# Patient Record
Sex: Male | Born: 1935 | Race: White | Hispanic: No | Marital: Married | Smoking: Former smoker
Health system: Southern US, Community
[De-identification: ages and names within clinical notes are randomized; demographics above are authoritative.]

## PROBLEM LIST (undated history)

## (undated) DIAGNOSIS — I639 Cerebral infarction, unspecified: Secondary | ICD-10-CM

## (undated) DIAGNOSIS — J811 Chronic pulmonary edema: Secondary | ICD-10-CM

## (undated) DIAGNOSIS — K625 Hemorrhage of anus and rectum: Secondary | ICD-10-CM

## (undated) DIAGNOSIS — I10 Essential (primary) hypertension: Secondary | ICD-10-CM

## (undated) DIAGNOSIS — K922 Gastrointestinal hemorrhage, unspecified: Secondary | ICD-10-CM

## (undated) DIAGNOSIS — C61 Malignant neoplasm of prostate: Secondary | ICD-10-CM

## (undated) DIAGNOSIS — K5792 Diverticulitis of intestine, part unspecified, without perforation or abscess without bleeding: Secondary | ICD-10-CM

## (undated) DIAGNOSIS — J449 Chronic obstructive pulmonary disease, unspecified: Secondary | ICD-10-CM

## (undated) DIAGNOSIS — J189 Pneumonia, unspecified organism: Secondary | ICD-10-CM

## (undated) DIAGNOSIS — G4730 Sleep apnea, unspecified: Secondary | ICD-10-CM

## (undated) HISTORY — PX: APPENDECTOMY: SHX54

## (undated) HISTORY — PX: TONSILLECTOMY: SUR1361

## (undated) HISTORY — DX: Sleep apnea, unspecified: G47.30

## (undated) HISTORY — DX: Hemorrhage of anus and rectum: K62.5

## (undated) HISTORY — DX: Diverticulitis of intestine, part unspecified, without perforation or abscess without bleeding: K57.92

## (undated) HISTORY — DX: Chronic pulmonary edema: J81.1

## (undated) HISTORY — DX: Essential (primary) hypertension: I10

## (undated) HISTORY — PX: HEMORRHOID SURGERY: SHX153

## (undated) HISTORY — DX: Pneumonia, unspecified organism: J18.9

## (undated) HISTORY — DX: Gastrointestinal hemorrhage, unspecified: K92.2

## (undated) HISTORY — DX: Malignant neoplasm of prostate: C61

## (undated) HISTORY — DX: Chronic obstructive pulmonary disease, unspecified: J44.9

## (undated) HISTORY — DX: Cerebral infarction, unspecified: I63.9

## (undated) HISTORY — PX: OTHER SURGICAL HISTORY: SHX169

---

## 2005-04-01 ENCOUNTER — Ambulatory Visit: Payer: Self-pay | Admitting: Specialist

## 2005-07-18 ENCOUNTER — Ambulatory Visit: Payer: Self-pay | Admitting: Gastroenterology

## 2007-09-20 ENCOUNTER — Ambulatory Visit: Payer: Self-pay | Admitting: Oncology

## 2007-10-08 ENCOUNTER — Ambulatory Visit: Payer: Self-pay | Admitting: Family Medicine

## 2007-10-13 ENCOUNTER — Ambulatory Visit: Payer: Self-pay | Admitting: Oncology

## 2007-10-18 ENCOUNTER — Ambulatory Visit: Payer: Self-pay | Admitting: Oncology

## 2007-12-09 ENCOUNTER — Ambulatory Visit: Payer: Self-pay | Admitting: Ophthalmology

## 2007-12-09 ENCOUNTER — Other Ambulatory Visit: Payer: Self-pay

## 2007-12-22 ENCOUNTER — Ambulatory Visit: Payer: Self-pay | Admitting: Ophthalmology

## 2008-01-12 ENCOUNTER — Ambulatory Visit: Payer: Self-pay | Admitting: Oncology

## 2008-01-18 ENCOUNTER — Ambulatory Visit: Payer: Self-pay | Admitting: Oncology

## 2009-02-14 ENCOUNTER — Ambulatory Visit: Payer: Self-pay | Admitting: Unknown Physician Specialty

## 2010-03-06 ENCOUNTER — Ambulatory Visit: Payer: Self-pay | Admitting: Ophthalmology

## 2010-03-20 ENCOUNTER — Ambulatory Visit: Payer: Self-pay | Admitting: Ophthalmology

## 2010-08-19 DIAGNOSIS — I639 Cerebral infarction, unspecified: Secondary | ICD-10-CM

## 2010-08-19 HISTORY — DX: Cerebral infarction, unspecified: I63.9

## 2011-02-19 DIAGNOSIS — D696 Thrombocytopenia, unspecified: Secondary | ICD-10-CM

## 2011-02-19 DIAGNOSIS — J189 Pneumonia, unspecified organism: Secondary | ICD-10-CM

## 2011-02-19 DIAGNOSIS — I10 Essential (primary) hypertension: Secondary | ICD-10-CM

## 2011-02-19 DIAGNOSIS — I270 Primary pulmonary hypertension: Secondary | ICD-10-CM

## 2011-02-26 ENCOUNTER — Telehealth: Payer: Self-pay | Admitting: *Deleted

## 2011-02-26 ENCOUNTER — Inpatient Hospital Stay: Payer: Self-pay | Admitting: Internal Medicine

## 2011-02-26 NOTE — Telephone Encounter (Signed)
[facility] Triage Call Report Triage Record Num: [id number] Operator: Abe People Patient Name: Trevor Ferguson Call Date & Time: 02/25/2011 6:45:53PM Patient Phone: [phone] PCP: Patient Gender: Male PCP Fax : Patient DOB: [birth year] Practice Name: [facility] Reason for Call: [facility] LPN. from [facility] calling that pt had pneumonia and is in rehab to gain strength. He is weaning from O/2. He states he felt nervous inside and vision was blurred and he felt unsteady on his feet and tingling in R hand that passed. His eyes are sill not focusing well. This happened at [place]. B/P = 158/78 HR 71, RR 20 O2 on 2 liters is 96%. Triaged Dizziness ot Vertigo. Last voided at 1745. All emergent SX R/O and home care and call back inst given. Notify ofc or provider that rounds 02/26/11. Protocol(s) Used: Dizziness or Vertigo Recommended Outcome per Protocol: See Provider within 24 hours Reason for Outcome: Associated with fatigue or decreased exercise tolerance Care Advice: ~ DO NOT drive until condition evaluated. ~ Call provider if symptoms worsen or new symptoms develop. Avoid caffeine (coffee, tea, cola drinks, or chocolate), alcohol, and nicotine (use of tobacco), as use of these substances may worsen symptoms. ~ Change position slowly. Sit for a couple of minutes before standing to walk. Quick position changes may cause or worsen symptoms. ~ ~ Call provider if dizziness worsens, have weakness, or ringing in ears. ~ Lie still in a dimly lit room and avoid any sudden change in position. 02/25/2011 6:59:59PM Page 1 of 1 [email or web address]

## 2011-02-26 NOTE — Telephone Encounter (Signed)
Will evaluate status when there this afternoon

## 2011-03-01 DIAGNOSIS — C717 Malignant neoplasm of brain stem: Secondary | ICD-10-CM

## 2011-03-05 DIAGNOSIS — J159 Unspecified bacterial pneumonia: Secondary | ICD-10-CM

## 2011-04-01 ENCOUNTER — Telehealth: Payer: Self-pay | Admitting: *Deleted

## 2011-04-01 NOTE — Telephone Encounter (Signed)
error 

## 2013-06-11 ENCOUNTER — Ambulatory Visit: Payer: Self-pay | Admitting: Specialist

## 2014-03-19 ENCOUNTER — Emergency Department: Payer: Self-pay | Admitting: Emergency Medicine

## 2014-03-19 LAB — BASIC METABOLIC PANEL
Anion Gap: 7 (ref 7–16)
BUN: 26 mg/dL — ABNORMAL HIGH (ref 7–18)
CALCIUM: 8.6 mg/dL (ref 8.5–10.1)
Chloride: 105 mmol/L (ref 98–107)
Co2: 27 mmol/L (ref 21–32)
Creatinine: 1.37 mg/dL — ABNORMAL HIGH (ref 0.60–1.30)
EGFR (African American): 54 — ABNORMAL LOW
EGFR (Non-African Amer.): 46 — ABNORMAL LOW
GLUCOSE: 103 mg/dL — AB (ref 65–99)
Osmolality: 283 (ref 275–301)
POTASSIUM: 4 mmol/L (ref 3.5–5.1)
Sodium: 139 mmol/L (ref 136–145)

## 2014-03-19 LAB — URINALYSIS, COMPLETE
BILIRUBIN, UR: NEGATIVE
BLOOD: NEGATIVE
Bacteria: NONE SEEN
GLUCOSE, UR: NEGATIVE mg/dL (ref 0–75)
KETONE: NEGATIVE
Leukocyte Esterase: NEGATIVE
Nitrite: NEGATIVE
PROTEIN: NEGATIVE
Ph: 6 (ref 4.5–8.0)
SPECIFIC GRAVITY: 1.016 (ref 1.003–1.030)
Squamous Epithelial: NONE SEEN
WBC UR: <1 /HPF (ref 0–5)

## 2014-03-19 LAB — CBC
HCT: 37.7 % — AB (ref 40.0–52.0)
HGB: 12.8 g/dL — ABNORMAL LOW (ref 13.0–18.0)
MCH: 33 pg (ref 26.0–34.0)
MCHC: 34 g/dL (ref 32.0–36.0)
MCV: 97 fL (ref 80–100)
Platelet: 137 10*3/uL — ABNORMAL LOW (ref 150–440)
RBC: 3.88 10*6/uL — ABNORMAL LOW (ref 4.40–5.90)
RDW: 13.2 % (ref 11.5–14.5)
WBC: 8 10*3/uL (ref 3.8–10.6)

## 2014-03-19 LAB — TROPONIN I: Troponin-I: <0.02 ng/mL

## 2014-09-21 DIAGNOSIS — Z8673 Personal history of transient ischemic attack (TIA), and cerebral infarction without residual deficits: Secondary | ICD-10-CM | POA: Insufficient documentation

## 2014-09-21 DIAGNOSIS — I10 Essential (primary) hypertension: Secondary | ICD-10-CM | POA: Insufficient documentation

## 2014-09-21 DIAGNOSIS — J449 Chronic obstructive pulmonary disease, unspecified: Secondary | ICD-10-CM | POA: Insufficient documentation

## 2015-03-28 ENCOUNTER — Encounter: Payer: Self-pay | Admitting: Internal Medicine

## 2015-03-28 ENCOUNTER — Ambulatory Visit
Admission: RE | Admit: 2015-03-28 | Discharge: 2015-03-28 | Disposition: A | Discharge status: Home / Self Care | Payer: Medicare Other | Source: Ambulatory Visit | Attending: Internal Medicine | Admitting: Internal Medicine

## 2015-03-28 ENCOUNTER — Encounter (INDEPENDENT_AMBULATORY_CARE_PROVIDER_SITE_OTHER): Payer: Self-pay

## 2015-03-28 ENCOUNTER — Ambulatory Visit (INDEPENDENT_AMBULATORY_CARE_PROVIDER_SITE_OTHER): Payer: Medicare Other | Admitting: Internal Medicine

## 2015-03-28 VITALS — BP 138/62 | HR 74 | Ht 69.0 in | Wt 207.0 lb

## 2015-03-28 DIAGNOSIS — J441 Chronic obstructive pulmonary disease with (acute) exacerbation: Secondary | ICD-10-CM | POA: Insufficient documentation

## 2015-03-28 DIAGNOSIS — J449 Chronic obstructive pulmonary disease, unspecified: Secondary | ICD-10-CM | POA: Diagnosis not present

## 2015-03-28 DIAGNOSIS — R0602 Shortness of breath: Secondary | ICD-10-CM | POA: Insufficient documentation

## 2015-03-28 DIAGNOSIS — G4730 Sleep apnea, unspecified: Secondary | ICD-10-CM | POA: Diagnosis not present

## 2015-03-28 MED ORDER — MOMETASONE FURO-FORMOTEROL FUM 200-5 MCG/ACT IN AERO: 2.0000 | INHALATION_SPRAY | Freq: Two times a day (BID) | RESPIRATORY_TRACT | Status: DC

## 2015-03-28 MED ORDER — ALBUTEROL SULFATE HFA 108 (90 BASE) MCG/ACT IN AERS: 2.0000 | INHALATION_SPRAY | Freq: Four times a day (QID) | RESPIRATORY_TRACT | Status: DC | PRN

## 2015-03-28 NOTE — Progress Notes (Signed)
Date: 03/28/2015,   MRN# [id number] Trevor Ferguson 1926/07/28 Code Status:  Hosp day:@LENGTHOFSTAYDAYS @ Referring MD: @ATDPROV @     PCP:      AdmissionWeight: 207 lb (93.895 kg)                 CurrentWeight: 207 lb (93.895 kg) Trevor Ferguson is a 81 y.o. old male seen in consultation for SOB with exertion.    CHIEF COMPLAINT:   SOb with exertion   HISTORY OF PRESENT ILLNESS   81 yo white male seen today for chronic SOB and DOe Patient states that his symptoms have been going on for several months, especially going up the stairs, alo woth simple gestures like tying shoes Patient has to sit for approx 10-15 minutes to recover Patient has had mulitpel pneumpnias in the past but none in the last 4 years and he does nOT have any signs of infection at this time  Patient has chronic cough for 25 years with intermittent sputum production Patient also had lower extremity leg swelling intermittent for several months  Patient has been diagnosed with sleep apnea many years ago and has been non-compliant with biPAP Patient states that he is very tired in AM, wife has to sleep in other room because of excessive snoring   PAST MEDICAL HISTORY   Past Medical History  Diagnosis Date  . COPD (chronic obstructive pulmonary disease)   . Hypertension   . Prostate cancer     with seed placement  . Stroke 2012    no residuals  . Pneumonia     [id number] hospitalized  . Sleep apnea     noncompliant, dx 1996     SURGICAL HISTORY   Past Surgical History  Procedure Laterality Date  . Tonsillectomy    . Appendectomy    . Adenoid    . Spine cyst    . Hemorrhoid surgery       FAMILY HISTORY   Family History  Problem Relation Age of Onset  . Congestive Heart Failure Mother   . COPD Sister   . Asthma Sister   . Tuberculosis Maternal Grandmother   . Stroke Maternal Grandfather      SOCIAL HISTORY   History  Substance Use Topics  . Smoking status: Former Smoker    Types:  Cigarettes    Quit date: 08/19/1985  . Smokeless tobacco: Never Used  . Alcohol Use: Yes     MEDICATIONS    Home Medication:  Current Outpatient Rx  Name  Route  Sig  Dispense  Refill  . calcium carbonate (OS-CAL) 600 MG TABS tablet   Oral   Take 600 mg by mouth daily with breakfast.         . Cholecalciferol (VITAMIN D-3 PO)   Oral   Take 1,000 Int'l Units by mouth daily.         Marland Kitchen dipyridamole-aspirin (AGGRENOX) 200-25 MG per 12 hr capsule   Oral   Take 1 capsule by mouth 2 (two) times daily.         . Loratadine (CLARITIN) 10 MG CAPS   Oral   Take 1 capsule by mouth daily.         Marland Kitchen losartan-hydrochlorothiazide (HYZAAR) 100-25 MG per tablet   Oral   Take 1 tablet by mouth daily.         . Multiple Vitamins-Minerals (CENTRAL-VITE PO)   Oral   Take 1 tablet by mouth daily.         Marland Kitchen  Omega-3 Fatty Acids (FISH OIL) 1000 MG CAPS   Oral   Take 1 capsule by mouth daily.         . Probiotic Product (PROBIOTIC ADVANCED PO)   Oral   Take 1 tablet by mouth daily.         Marland Kitchen albuterol (PROVENTIL HFA;VENTOLIN HFA) 108 (90 BASE) MCG/ACT inhaler   Inhalation   Inhale 2 puffs into the lungs every 6 (six) hours as needed for wheezing or shortness of breath.   1 Inhaler   2   . mometasone-formoterol (DULERA) 200-5 MCG/ACT AERO   Inhalation   Inhale 2 puffs into the lungs 2 (two) times daily.   1 Inhaler   12     Current Medication:  Current outpatient prescriptions:  .  calcium carbonate (OS-CAL) 600 MG TABS tablet, Take 600 mg by mouth daily with breakfast., Disp: , Rfl:  .  Cholecalciferol (VITAMIN D-3 PO), Take 1,000 Int'l Units by mouth daily., Disp: , Rfl:  .  dipyridamole-aspirin (AGGRENOX) 200-25 MG per 12 hr capsule, Take 1 capsule by mouth 2 (two) times daily., Disp: , Rfl:  .  Loratadine (CLARITIN) 10 MG CAPS, Take 1 capsule by mouth daily., Disp: , Rfl:  .  losartan-hydrochlorothiazide (HYZAAR) 100-25 MG per tablet, Take 1 tablet by mouth  daily., Disp: , Rfl:  .  Multiple Vitamins-Minerals (CENTRAL-VITE PO), Take 1 tablet by mouth daily., Disp: , Rfl:  .  Omega-3 Fatty Acids (FISH OIL) 1000 MG CAPS, Take 1 capsule by mouth daily., Disp: , Rfl:  .  Probiotic Product (PROBIOTIC ADVANCED PO), Take 1 tablet by mouth daily., Disp: , Rfl:  .  albuterol (PROVENTIL HFA;VENTOLIN HFA) 108 (90 BASE) MCG/ACT inhaler, Inhale 2 puffs into the lungs every 6 (six) hours as needed for wheezing or shortness of breath., Disp: 1 Inhaler, Rfl: 2 .  mometasone-formoterol (DULERA) 200-5 MCG/ACT AERO, Inhale 2 puffs into the lungs 2 (two) times daily., Disp: 1 Inhaler, Rfl: 12    ALLERGIES   Penicillins and Darvon     REVIEW OF SYSTEMS   Review of Systems  Constitutional: Positive for malaise/fatigue. Negative for fever, chills and weight loss.  HENT: Negative for congestion, hearing loss, sore throat and tinnitus.   Eyes: Negative for blurred vision and double vision.  Respiratory: Positive for cough, sputum production and shortness of breath. Negative for wheezing and stridor.   Cardiovascular: Positive for orthopnea and leg swelling. Negative for chest pain and palpitations.  Gastrointestinal: Negative for heartburn, nausea, vomiting and abdominal pain.  Genitourinary: Negative for dysuria.  Musculoskeletal: Negative for myalgias and neck pain.  Skin: Negative for itching and rash.  Neurological: Negative for dizziness, tingling, tremors, speech change and headaches.  Endo/Heme/Allergies: Does not bruise/bleed easily.  Psychiatric/Behavioral: Negative for depression. The patient is not nervous/anxious.      VS: BP 138/62 mmHg  Pulse 74  Ht 5\' 9"  (1.753 m)  Wt 207 lb (93.895 kg)  BMI 30.55 kg/m2  SpO2 93%     PHYSICAL EXAM  Physical Exam  Constitutional: He is oriented to person, place, and time. He appears well-developed and well-nourished. No distress.  HENT:  Head: Normocephalic and atraumatic.  Mouth/Throat: No  oropharyngeal exudate.  Eyes: EOM are normal. Pupils are equal, round, and reactive to light. No scleral icterus.  Neck: Normal range of motion. Neck supple.  Cardiovascular: Normal rate, regular rhythm and normal heart sounds.   No murmur heard. Pulmonary/Chest: No stridor. No respiratory distress. He has no wheezes.  Abdominal: Soft. Bowel sounds are normal. He exhibits no distension. There is no tenderness. There is no rebound.  Musculoskeletal: Normal range of motion. He exhibits no edema.  Neurological: He is alert and oriented to person, place, and time. He displays normal reflexes. Coordination normal.  Skin: Skin is warm. He is not diaphoretic.  Psychiatric: He has a normal mood and affect.        LABS    No results for input(s): HGB, HCT, MCV, WBC, POTASSIUM, CHLORIDE, BUN, CREATININE, GLUCOSE, CALCIUM, INR, PTT in the last 72 hours.  Invalid input(s): PLATELET, BANDS, NEUTROPHIL, LYMPHOCYTE, MONOCYTE, EOSINOPHILS, BASOPHIL, SODIUM, BICARBONATE, MAGNESIUM, PHOSPHORUS, PT, SGPT, SGOT,    No results for input(s): PH in the last 72 hours.  Invalid input(s): PCO2, PO2, BASEEXCESS, BASEDEFICITE, TFT    CULTURE RESULTS   No results found for this or any previous visit (from the past 240 hour(s)).        IMAGING    No results found.       ASSESSMENT/PLAN    81 yo pleasant white male with chronic SOB with chronic cough findings suggest COPD with chronic Bronchitis with DOE associated with LE edema with signs and symptoms of sleep apnea which could be related to Cor pumonale/RT heart strain and possible CHF     SOB (shortness of breath) -patient with chronic SOB and DOE with LE edema-will need to assess cardiac function and assess for PUlm HTN -will need further work up at this point -will obtain CXR -will obtain ECHO -will obtain PFT's  Sleep apnea -patient has been diagnosed with sleep apnea over 15 years ago -will need to assess for OSA with sleep  study-set up for split night study  COPD (chronic obstructive pulmonary disease) -will repeat PFT's -patient quit tobacco use several years ago -previous PFT-shows moderate obstruction of small airways -will start Dulera -advised to use albuterol as needed        I have personally obtained a history, examined the patient, evaluated laboratory and independently reviewed imaging results, formulated the assessment and plan and placed orders.  The Patient requires high complexity decision making for assessment and support, frequent evaluation and titration of therapies, application of advanced monitoring technologies and extensive interpretation of multiple databases. Time spent with patient 45 minutes.  Patient is satisfied with Plan of action and management.    [name], M.D.  [name]  Medical Director [place] Director [facility] Cardio-Pulmonary Department

## 2015-03-28 NOTE — Assessment & Plan Note (Signed)
-  patient has been diagnosed with sleep apnea over 15 years ago -will need to assess for OSA with sleep study-set up for split night study

## 2015-03-28 NOTE — Assessment & Plan Note (Signed)
-  will repeat PFT's -patient quit tobacco use several years ago -previous PFT-shows moderate obstruction of small airways -will start Dulera -advised to use albuterol as needed

## 2015-03-28 NOTE — Patient Instructions (Signed)

## 2015-03-28 NOTE — Assessment & Plan Note (Signed)
-  patient with chronic SOB and DOE with LE edema-will need to assess cardiac function and assess for PUlm HTN -will need further work up at this point -will obtain CXR -will obtain ECHO -will obtain PFT's

## 2015-04-04 ENCOUNTER — Ambulatory Visit (INDEPENDENT_AMBULATORY_CARE_PROVIDER_SITE_OTHER): Payer: Medicare Other

## 2015-04-04 ENCOUNTER — Other Ambulatory Visit: Payer: Self-pay

## 2015-04-04 DIAGNOSIS — R0602 Shortness of breath: Secondary | ICD-10-CM | POA: Diagnosis not present

## 2015-05-03 ENCOUNTER — Ambulatory Visit: Payer: Medicare Other | Attending: Otolaryngology

## 2015-05-03 DIAGNOSIS — G4733 Obstructive sleep apnea (adult) (pediatric): Secondary | ICD-10-CM | POA: Insufficient documentation

## 2015-05-23 ENCOUNTER — Telehealth: Payer: Self-pay | Admitting: *Deleted

## 2015-05-23 NOTE — Telephone Encounter (Signed)
Patient is in cardiology clinic with his wife today. He stopped by my desk to let us know that he has not heard any results from recent test. Please call patient.

## 2015-05-23 NOTE — Telephone Encounter (Signed)
Pt informed and requested earlier appt than Nov. Pt scheduled. Nothing further needed.

## 2015-05-23 NOTE — Telephone Encounter (Signed)
Pt is asking for results of CXR and Echo. Pt is in cardiology with wife. Thanks.

## 2015-05-23 NOTE — Telephone Encounter (Signed)
cxr c/w COPD

## 2015-05-30 ENCOUNTER — Encounter: Payer: Self-pay | Admitting: Internal Medicine

## 2015-05-30 ENCOUNTER — Ambulatory Visit (INDEPENDENT_AMBULATORY_CARE_PROVIDER_SITE_OTHER): Payer: Medicare Other | Admitting: Internal Medicine

## 2015-05-30 VITALS — BP 122/76 | HR 74 | Wt 211.0 lb

## 2015-05-30 DIAGNOSIS — I519 Heart disease, unspecified: Secondary | ICD-10-CM | POA: Diagnosis not present

## 2015-05-30 DIAGNOSIS — J449 Chronic obstructive pulmonary disease, unspecified: Secondary | ICD-10-CM

## 2015-05-30 DIAGNOSIS — J441 Chronic obstructive pulmonary disease with (acute) exacerbation: Secondary | ICD-10-CM

## 2015-05-30 DIAGNOSIS — I5189 Other ill-defined heart diseases: Secondary | ICD-10-CM

## 2015-05-30 MED ORDER — FLUTICASONE FUROATE-VILANTEROL 100-25 MCG/INH IN AEPB: 1.0000 | INHALATION_SPRAY | Freq: Every day | RESPIRATORY_TRACT | Status: DC

## 2015-05-30 NOTE — Progress Notes (Signed)
Date: 05/30/2015,   MRN# [id number] Trevor DEBERARDINIS 1925/08/21 Code Status:  Hosp day:@LENGTHOFSTAYDAYS @ Referring MD: @ATDPROV @     PCP:      AdmissionWeight: 211 lb (95.709 kg)                 CurrentWeight: 211 lb (95.709 kg) Trevor Ferguson is a 81 y.o. old male seen in consultation for SOB with exertion.    CHIEF COMPLAINT:   SOb with exertion   HISTORY OF PRESENT ILLNESS  Patient with some SOB, DOE and fatigue CXR shows flattened diaphragms ECHO shows grade 1 diastolic dysfunction  PFTs and 6 MWT and Overnight Pulse ox pending Still with some productive cough, no signs of infection at this time  PAST MEDICAL HISTORY   Past Medical History  Diagnosis Date  . COPD (chronic obstructive pulmonary disease) ([place])   . Hypertension   . Prostate cancer ([place])     with seed placement  . Stroke [facility]) 2012    no residuals  . Pneumonia     [id number] hospitalized  . Sleep apnea     noncompliant, dx 1996     SURGICAL HISTORY   Past Surgical History  Procedure Laterality Date  . Tonsillectomy    . Appendectomy    . Adenoid    . Spine cyst    . Hemorrhoid surgery       FAMILY HISTORY   Family History  Problem Relation Age of Onset  . Congestive Heart Failure Mother   . COPD Sister   . Asthma Sister   . Tuberculosis Maternal Grandmother   . Stroke Maternal Grandfather      SOCIAL HISTORY   Social History  Substance Use Topics  . Smoking status: Former Smoker    Types: Cigarettes    Quit date: 08/19/1985  . Smokeless tobacco: Never Used  . Alcohol Use: Yes     MEDICATIONS    Home Medication:  Current Outpatient Rx  Name  Route  Sig  Dispense  Refill  . albuterol (PROVENTIL HFA;VENTOLIN HFA) 108 (90 BASE) MCG/ACT inhaler   Inhalation   Inhale 2 puffs into the lungs every 6 (six) hours as needed for wheezing or shortness of breath.   1 Inhaler   2   . calcium carbonate (OS-CAL) 600 MG TABS tablet   Oral   Take 600 mg by mouth daily with  breakfast.         . Cholecalciferol (VITAMIN D-3 PO)   Oral   Take 1,000 Int'l Units by mouth daily.         Marland Kitchen dipyridamole-aspirin (AGGRENOX) 200-25 MG per 12 hr capsule   Oral   Take 1 capsule by mouth 2 (two) times daily.         . Loratadine (CLARITIN) 10 MG CAPS   Oral   Take 1 capsule by mouth daily.         Marland Kitchen losartan-hydrochlorothiazide (HYZAAR) 100-25 MG per tablet   Oral   Take 1 tablet by mouth daily.         . mometasone-formoterol (DULERA) 200-5 MCG/ACT AERO   Inhalation   Inhale 2 puffs into the lungs 2 (two) times daily.   1 Inhaler   12   . Multiple Vitamins-Minerals (CENTRAL-VITE PO)   Oral   Take 1 tablet by mouth daily.         . Omega-3 Fatty Acids (FISH OIL) 1000 MG CAPS   Oral   Take 1 capsule by mouth daily.         Marland Kitchen  Probiotic Product (PROBIOTIC ADVANCED PO)   Oral   Take 1 tablet by mouth daily.           Current Medication:  Current outpatient prescriptions:  .  albuterol (PROVENTIL HFA;VENTOLIN HFA) 108 (90 BASE) MCG/ACT inhaler, Inhale 2 puffs into the lungs every 6 (six) hours as needed for wheezing or shortness of breath., Disp: 1 Inhaler, Rfl: 2 .  calcium carbonate (OS-CAL) 600 MG TABS tablet, Take 600 mg by mouth daily with breakfast., Disp: , Rfl:  .  Cholecalciferol (VITAMIN D-3 PO), Take 1,000 Int'l Units by mouth daily., Disp: , Rfl:  .  dipyridamole-aspirin (AGGRENOX) 200-25 MG per 12 hr capsule, Take 1 capsule by mouth 2 (two) times daily., Disp: , Rfl:  .  Loratadine (CLARITIN) 10 MG CAPS, Take 1 capsule by mouth daily., Disp: , Rfl:  .  losartan-hydrochlorothiazide (HYZAAR) 100-25 MG per tablet, Take 1 tablet by mouth daily., Disp: , Rfl:  .  mometasone-formoterol (DULERA) 200-5 MCG/ACT AERO, Inhale 2 puffs into the lungs 2 (two) times daily., Disp: 1 Inhaler, Rfl: 12 .  Multiple Vitamins-Minerals (CENTRAL-VITE PO), Take 1 tablet by mouth daily., Disp: , Rfl:  .  Omega-3 Fatty Acids (FISH OIL) 1000 MG CAPS, Take  1 capsule by mouth daily., Disp: , Rfl:  .  Probiotic Product (PROBIOTIC ADVANCED PO), Take 1 tablet by mouth daily., Disp: , Rfl:     ALLERGIES   Penicillins and Darvon     REVIEW OF SYSTEMS   Review of Systems  Constitutional: Positive for malaise/fatigue. Negative for fever, chills and weight loss.  HENT: Negative for congestion, hearing loss, sore throat and tinnitus.   Eyes: Negative for blurred vision and double vision.  Respiratory: Positive for cough, sputum production and shortness of breath. Negative for wheezing and stridor.   Cardiovascular: Positive for orthopnea and leg swelling. Negative for chest pain and palpitations.  Gastrointestinal: Negative for heartburn, nausea, vomiting and abdominal pain.  Genitourinary: Negative for dysuria.  Musculoskeletal: Negative for myalgias and neck pain.  Skin: Negative for itching and rash.  Neurological: Negative for dizziness, tingling, tremors, speech change and headaches.  Endo/Heme/Allergies: Does not bruise/bleed easily.  Psychiatric/Behavioral: Negative for depression. The patient is not nervous/anxious.      VS: BP 122/76 mmHg  Pulse 74  Wt 211 lb (95.709 kg)  SpO2 93%     PHYSICAL EXAM  Physical Exam  Constitutional: He is oriented to person, place, and time. He appears well-developed and well-nourished. No distress.  HENT:  Head: Normocephalic and atraumatic.  Mouth/Throat: No oropharyngeal exudate.  Eyes: EOM are normal. Pupils are equal, round, and reactive to light. No scleral icterus.  Neck: Normal range of motion. Neck supple.  Cardiovascular: Normal rate, regular rhythm and normal heart sounds.   No murmur heard. Pulmonary/Chest: No stridor. No respiratory distress. He has no wheezes.  Abdominal: Soft. Bowel sounds are normal. He exhibits no distension. There is no tenderness. There is no rebound.  Musculoskeletal: Normal range of motion. He exhibits no edema.  Neurological: He is alert and oriented  to person, place, and time. He displays normal reflexes. Coordination normal.  Skin: Skin is warm. He is not diaphoretic.  Psychiatric: He has a normal mood and affect.      ASSESSMENT/PLAN    81 yo pleasant white male with chronic SOB with chronic cough findings suggest COPD with chronic Bronchitis with DOE associated with LE edema with signs and symptoms of sleep apnea which could be related to Cor  pumonale/RT heart strain and Grade 1 diastolic dysfunction. CXR and ECHO reveiwed with patient  1.will need PFT and 6 MWT 2.d.c dulera and start Breo-Ellipta 3.albuterol as needed 4.cardiology referral for Diastolic CHF 5.check overnight pulse oximtery 6.OSA-will follow up with DR.  Ramachandran for further assessment  I have personally obtained a history, examined the patient, evaluated laboratory and independently reviewed imaging results, formulated the assessment and plan and placed orders.  The Patient requires high complexity decision making for assessment and support, frequent evaluation and titration of therapies, application of advanced monitoring technologies and extensive interpretation of multiple databases.  Patient is satisfied with Plan of action and management.    [name], M.D.  [name]  Medical Director [place] Director [facility] Cardio-Pulmonary Department

## 2015-05-30 NOTE — Patient Instructions (Signed)
Chronic Obstructive Pulmonary Disease Chronic obstructive pulmonary disease (COPD) is a common lung condition in which airflow from the lungs is limited. COPD is a general term that can be used to describe many different lung problems that limit airflow, including both chronic bronchitis and emphysema. If you have COPD, your lung function will probably never return to normal, but there are measures you can take to improve lung function and make yourself feel better. CAUSES   Smoking (common).  Exposure to secondhand smoke.  Genetic problems.  Chronic inflammatory lung diseases or recurrent infections. SYMPTOMS  Shortness of breath, especially with physical activity.  Deep, persistent (chronic) cough with a large amount of thick mucus.  Wheezing.  Rapid breaths (tachypnea).  Gray or bluish discoloration (cyanosis) of the skin, especially in your fingers, toes, or lips.  Fatigue.  Weight loss.  Frequent infections or episodes when breathing symptoms become much worse (exacerbations).  Chest tightness. DIAGNOSIS Your health care provider will take a medical history and perform a physical examination to diagnose COPD. Additional tests for COPD may include:  Lung (pulmonary) function tests.  Chest X-ray.  CT scan.  Blood tests. TREATMENT  Treatment for COPD may include:  Inhaler and nebulizer medicines. These help manage the symptoms of COPD and make your breathing more comfortable.  Supplemental oxygen. Supplemental oxygen is only helpful if you have a low oxygen level in your blood.  Exercise and physical activity. These are beneficial for nearly all people with COPD.  Lung surgery or transplant.  Nutrition therapy to gain weight, if you are underweight.  Pulmonary rehabilitation. This may involve working with a team of health care providers and specialists, such as respiratory, occupational, and physical therapists. HOME CARE INSTRUCTIONS  Take all medicines  (inhaled or pills) as directed by your health care provider.  Avoid over-the-counter medicines or cough syrups that dry up your airway (such as antihistamines) and slow down the elimination of secretions unless instructed otherwise by your health care provider.  If you are a smoker, the most important thing that you can do is stop smoking. Continuing to smoke will cause further lung damage and breathing trouble. Ask your health care provider for help with quitting smoking. He or she can direct you to community resources or hospitals that provide support.  Avoid exposure to irritants such as smoke, chemicals, and fumes that aggravate your breathing.  Use oxygen therapy and pulmonary rehabilitation if directed by your health care provider. If you require home oxygen therapy, ask your health care provider whether you should purchase a pulse oximeter to measure your oxygen level at home.  Avoid contact with individuals who have a contagious illness.  Avoid extreme temperature and humidity changes.  Eat healthy foods. Eating smaller, more frequent meals and resting before meals may help you maintain your strength.  Stay active, but balance activity with periods of rest. Exercise and physical activity will help you maintain your ability to do things you want to do.  Preventing infection and hospitalization is very important when you have COPD. Make sure to receive all the vaccines your health care provider recommends, especially the pneumococcal and influenza vaccines. Ask your health care provider whether you need a pneumonia vaccine.  Learn and use relaxation techniques to manage stress.  Learn and use controlled breathing techniques as directed by your health care provider. Controlled breathing techniques include:  Pursed lip breathing. Start by breathing in (inhaling) through your nose for 1 second. Then, purse your lips as if you were   going to whistle and breathe out (exhale) through the  pursed lips for 2 seconds.  Diaphragmatic breathing. Start by putting one hand on your abdomen just above your waist. Inhale slowly through your nose. The hand on your abdomen should move out. Then purse your lips and exhale slowly. You should be able to feel the hand on your abdomen moving in as you exhale.  Learn and use controlled coughing to clear mucus from your lungs. Controlled coughing is a series of short, progressive coughs. The steps of controlled coughing are: 1. Lean your head slightly forward. 2. Breathe in deeply using diaphragmatic breathing. 3. Try to hold your breath for 3 seconds. 4. Keep your mouth slightly open while coughing twice. 5. Spit any mucus out into a tissue. 6. Rest and repeat the steps once or twice as needed. SEEK MEDICAL CARE IF:  You are coughing up more mucus than usual.  There is a change in the color or thickness of your mucus.  Your breathing is more labored than usual.  Your breathing is faster than usual. SEEK IMMEDIATE MEDICAL CARE IF:  You have shortness of breath while you are resting.  You have shortness of breath that prevents you from:  Being able to talk.  Performing your usual physical activities.  You have chest pain lasting longer than 5 minutes.  Your skin color is more cyanotic than usual.  You measure low oxygen saturations for longer than 5 minutes with a pulse oximeter. MAKE SURE YOU:  Understand these instructions.  Will watch your condition.  Will get help right away if you are not doing well or get worse.   This information is not intended to replace advice given to you by your health care provider. Make sure you discuss any questions you have with your health care provider.   Document Released: 05/15/2005 Document Revised: 08/26/2014 Document Reviewed: 04/01/2013 Elsevier Interactive Patient Education 2016 [employer]  

## 2015-05-30 NOTE — Addendum Note (Signed)
Addended by: Oscar La R on: 05/30/2015 10:19 AM   Modules accepted: Orders

## 2015-06-02 DIAGNOSIS — C61 Malignant neoplasm of prostate: Secondary | ICD-10-CM | POA: Insufficient documentation

## 2015-06-10 ENCOUNTER — Encounter: Payer: Self-pay | Admitting: Internal Medicine

## 2015-06-15 ENCOUNTER — Other Ambulatory Visit: Payer: Self-pay | Admitting: Internal Medicine

## 2015-06-15 DIAGNOSIS — J449 Chronic obstructive pulmonary disease, unspecified: Secondary | ICD-10-CM

## 2015-06-20 ENCOUNTER — Ambulatory Visit: Payer: Medicare Other | Admitting: Internal Medicine

## 2015-06-20 ENCOUNTER — Ambulatory Visit (INDEPENDENT_AMBULATORY_CARE_PROVIDER_SITE_OTHER): Payer: Medicare Other | Admitting: Internal Medicine

## 2015-06-20 ENCOUNTER — Ambulatory Visit: Payer: PRIVATE HEALTH INSURANCE | Admitting: Internal Medicine

## 2015-06-20 ENCOUNTER — Encounter: Payer: Self-pay | Admitting: Internal Medicine

## 2015-06-20 VITALS — BP 130/68 | HR 79 | Ht 69.0 in | Wt 210.0 lb

## 2015-06-20 DIAGNOSIS — R0602 Shortness of breath: Secondary | ICD-10-CM

## 2015-06-20 DIAGNOSIS — J449 Chronic obstructive pulmonary disease, unspecified: Secondary | ICD-10-CM

## 2015-06-20 DIAGNOSIS — G4733 Obstructive sleep apnea (adult) (pediatric): Secondary | ICD-10-CM

## 2015-06-20 DIAGNOSIS — M25559 Pain in unspecified hip: Secondary | ICD-10-CM

## 2015-06-20 LAB — PULMONARY FUNCTION TEST
DL/VA % pred: 58 %
DL/VA: 2.64 ml/min/mmHg/L
DLCO UNC % PRED: 48 %
DLCO UNC: 15.12 ml/min/mmHg
FEF 25-75 POST: 1.84 L/s
FEF 25-75 Pre: 1.43 L/sec
FEF2575-%CHANGE-POST: 28 %
FEF2575-%PRED-POST: 121 %
FEF2575-%Pred-Pre: 94 %
FEV1-%CHANGE-POST: 7 %
FEV1-%PRED-POST: 93 %
FEV1-%PRED-PRE: 87 %
FEV1-POST: 2.28 L
FEV1-Pre: 2.13 L
FEV1FVC-%CHANGE-POST: 1 %
FEV1FVC-%PRED-PRE: 101 %
FEV6-%Change-Post: 4 %
FEV6-%PRED-POST: 95 %
FEV6-%Pred-Pre: 91 %
FEV6-PRE: 2.98 L
FEV6-Post: 3.11 L
FEV6FVC-%Change-Post: 0 %
FEV6FVC-%PRED-PRE: 107 %
FEV6FVC-%Pred-Post: 107 %
FVC-%Change-Post: 5 %
FVC-%PRED-POST: 88 %
FVC-%PRED-PRE: 84 %
FVC-POST: 3.15 L
FVC-PRE: 3 L
POST FEV1/FVC RATIO: 72 %
PRE FEV6/FVC RATIO: 99 %
Post FEV6/FVC ratio: 99 %
Pre FEV1/FVC ratio: 71 %

## 2015-06-20 MED ORDER — CLONAZEPAM 0.25 MG PO TBDP: 0.2500 mg | ORAL_TABLET | Freq: Two times a day (BID) | ORAL | Status: DC

## 2015-06-20 NOTE — Progress Notes (Signed)
PFT performed today with nitrogen washout. 

## 2015-06-20 NOTE — Progress Notes (Signed)
Date: 06/20/2015,   MRN# [id number] ELIO GILLENWATER 12-16-1925 Code Status:  Hosp day:@LENGTHOFSTAYDAYS @ Referring MD: @ATDPROV @     PCP:      AdmissionWeight: 210 lb (95.255 kg)                 CurrentWeight: 210 lb (95.255 kg) Trevor Ferguson is a 81 y.o. old male seen in consultation for SOB with exertion.  PFT's: 11/1 Fev 1 2.13L 87%predicted Ratio 71% predicted DLCO 48% predicted TLC 4.9 6MWT WNL  CHIEF COMPLAINT:   SOb with exertion, follow up PFT's and 6MWT   HISTORY OF PRESENT ILLNESS  Patient with SOB and some cough 2 times in last month, resolved with albuterol inhlaer  03/2015 CXR shows flattened diaphragms reviewed 06/20/2015  ECHO shows grade 1 diastolic dysfunction Overnight pulse ox reveals desats 113 times, patient needs Oxygen at night PFT's c/w mild obstructive airways disease Still with some cough, no signs of infection at this time Patient would like to see if he does well off of breo-ellipta       Current Medication:  Current outpatient prescriptions:  .  albuterol (PROVENTIL HFA;VENTOLIN HFA) 108 (90 BASE) MCG/ACT inhaler, Inhale 2 puffs into the lungs every 6 (six) hours as needed for wheezing or shortness of breath., Disp: 1 Inhaler, Rfl: 2 .  calcium carbonate (OS-CAL) 600 MG TABS tablet, Take 600 mg by mouth daily with breakfast., Disp: , Rfl:  .  Cholecalciferol (VITAMIN D-3 PO), Take 1,000 Int'l Units by mouth daily., Disp: , Rfl:  .  dipyridamole-aspirin (AGGRENOX) 200-25 MG per 12 hr capsule, Take 1 capsule by mouth 2 (two) times daily., Disp: , Rfl:  .  Fluticasone Furoate-Vilanterol 100-25 MCG/INH AEPB, Inhale 1 puff into the lungs daily., Disp: 60 each, Rfl: 3 .  Loratadine (CLARITIN) 10 MG CAPS, Take 1 capsule by mouth daily., Disp: , Rfl:  .  losartan-hydrochlorothiazide (HYZAAR) 100-25 MG per tablet, Take 1 tablet by mouth daily., Disp: , Rfl:  .  Multiple Vitamins-Minerals (CENTRAL-VITE PO), Take 1 tablet by mouth daily., Disp: , Rfl:  .   Omega-3 Fatty Acids (FISH OIL) 1000 MG CAPS, Take 1 capsule by mouth daily., Disp: , Rfl:  .  Probiotic Product (PROBIOTIC ADVANCED PO), Take 1 tablet by mouth daily., Disp: , Rfl:     ALLERGIES   Penicillins and Darvon     REVIEW OF SYSTEMS   Review of Systems  Constitutional: Negative for fever and chills.  Respiratory: Positive for cough. Negative for sputum production and shortness of breath.   Cardiovascular: Positive for leg swelling. Negative for chest pain.  Gastrointestinal: Negative.   Skin: Negative for rash.     VS: BP 130/68 mmHg  Pulse 79  Ht 5\' 9"  (1.753 m)  Wt 210 lb (95.255 kg)  BMI 31.00 kg/m2  SpO2 97%     PHYSICAL EXAM  Physical Exam  Constitutional: He appears well-developed and well-nourished.  Cardiovascular: Normal rate, regular rhythm and normal heart sounds.   No murmur heard. Pulmonary/Chest: No respiratory distress. He has no wheezes.  Abdominal: He exhibits no distension. There is no tenderness. There is no rebound.  Musculoskeletal: Normal range of motion. He exhibits edema.  Psychiatric: He has a normal mood and affect.      ASSESSMENT/PLAN    81 yo pleasant white male with MILD COPD with chronic Bronchitis with DOE associated with LE edema with signs and symptoms of sleep apnea which could be related to Cor pumonale/RT heart strain and Grade  1 diastolic dysfunction. CXR and ECHO reveiwed with patient  1.albuterol as needed 2.will see if patient does well off breo-ellipta 3.cardiology referral for Diastolic CHF pending 4.OSA-will follow up with DR.  Ramachandran for further assessment 5.needs oxygen as night  Follow up in 6 months  I have personally obtained a history, examined the patient, evaluated laboratory and independently reviewed imaging results, formulated the assessment and plan and placed orders.  The Patient requires high complexity decision making for assessment and support, frequent evaluation and titration of  therapies, application of advanced monitoring technologies and extensive interpretation of multiple databases.  Patient is satisfied with Plan of action and management.    [name], M.D.  [name]  Medical Director [place] Director [facility] Cardio-Pulmonary Department

## 2015-06-20 NOTE — Progress Notes (Signed)
SMW performed today. 

## 2015-06-20 NOTE — Patient Instructions (Signed)
Chronic Obstructive Pulmonary Disease Chronic obstructive pulmonary disease (COPD) is a common lung condition in which airflow from the lungs is limited. COPD is a general term that can be used to describe many different lung problems that limit airflow, including both chronic bronchitis and emphysema. If you have COPD, your lung function will probably never return to normal, but there are measures you can take to improve lung function and make yourself feel better. CAUSES   Smoking (common).  Exposure to secondhand smoke.  Genetic problems.  Chronic inflammatory lung diseases or recurrent infections. SYMPTOMS  Shortness of breath, especially with physical activity.  Deep, persistent (chronic) cough with a large amount of thick mucus.  Wheezing.  Rapid breaths (tachypnea).  Gray or bluish discoloration (cyanosis) of the skin, especially in your fingers, toes, or lips.  Fatigue.  Weight loss.  Frequent infections or episodes when breathing symptoms become much worse (exacerbations).  Chest tightness. DIAGNOSIS Your health care provider will take a medical history and perform a physical examination to diagnose COPD. Additional tests for COPD may include:  Lung (pulmonary) function tests.  Chest X-ray.  CT scan.  Blood tests. TREATMENT  Treatment for COPD may include:  Inhaler and nebulizer medicines. These help manage the symptoms of COPD and make your breathing more comfortable.  Supplemental oxygen. Supplemental oxygen is only helpful if you have a low oxygen level in your blood.  Exercise and physical activity. These are beneficial for nearly all people with COPD.  Lung surgery or transplant.  Nutrition therapy to gain weight, if you are underweight.  Pulmonary rehabilitation. This may involve working with a team of health care providers and specialists, such as respiratory, occupational, and physical therapists. HOME CARE INSTRUCTIONS  Take all medicines  (inhaled or pills) as directed by your health care provider.  Avoid over-the-counter medicines or cough syrups that dry up your airway (such as antihistamines) and slow down the elimination of secretions unless instructed otherwise by your health care provider.  If you are a smoker, the most important thing that you can do is stop smoking. Continuing to smoke will cause further lung damage and breathing trouble. Ask your health care provider for help with quitting smoking. He or she can direct you to community resources or hospitals that provide support.  Avoid exposure to irritants such as smoke, chemicals, and fumes that aggravate your breathing.  Use oxygen therapy and pulmonary rehabilitation if directed by your health care provider. If you require home oxygen therapy, ask your health care provider whether you should purchase a pulse oximeter to measure your oxygen level at home.  Avoid contact with individuals who have a contagious illness.  Avoid extreme temperature and humidity changes.  Eat healthy foods. Eating smaller, more frequent meals and resting before meals may help you maintain your strength.  Stay active, but balance activity with periods of rest. Exercise and physical activity will help you maintain your ability to do things you want to do.  Preventing infection and hospitalization is very important when you have COPD. Make sure to receive all the vaccines your health care provider recommends, especially the pneumococcal and influenza vaccines. Ask your health care provider whether you need a pneumonia vaccine.  Learn and use relaxation techniques to manage stress.  Learn and use controlled breathing techniques as directed by your health care provider. Controlled breathing techniques include:  Pursed lip breathing. Start by breathing in (inhaling) through your nose for 1 second. Then, purse your lips as if you were   going to whistle and breathe out (exhale) through the  pursed lips for 2 seconds.  Diaphragmatic breathing. Start by putting one hand on your abdomen just above your waist. Inhale slowly through your nose. The hand on your abdomen should move out. Then purse your lips and exhale slowly. You should be able to feel the hand on your abdomen moving in as you exhale.  Learn and use controlled coughing to clear mucus from your lungs. Controlled coughing is a series of short, progressive coughs. The steps of controlled coughing are: 1. Lean your head slightly forward. 2. Breathe in deeply using diaphragmatic breathing. 3. Try to hold your breath for 3 seconds. 4. Keep your mouth slightly open while coughing twice. 5. Spit any mucus out into a tissue. 6. Rest and repeat the steps once or twice as needed. SEEK MEDICAL CARE IF:  You are coughing up more mucus than usual.  There is a change in the color or thickness of your mucus.  Your breathing is more labored than usual.  Your breathing is faster than usual. SEEK IMMEDIATE MEDICAL CARE IF:  You have shortness of breath while you are resting.  You have shortness of breath that prevents you from:  Being able to talk.  Performing your usual physical activities.  You have chest pain lasting longer than 5 minutes.  Your skin color is more cyanotic than usual.  You measure low oxygen saturations for longer than 5 minutes with a pulse oximeter. MAKE SURE YOU:  Understand these instructions.  Will watch your condition.  Will get help right away if you are not doing well or get worse.   This information is not intended to replace advice given to you by your health care provider. Make sure you discuss any questions you have with your health care provider.   Document Released: 05/15/2005 Document Revised: 08/26/2014 Document Reviewed: 04/01/2013 Elsevier Interactive Patient Education 2016 [employer]  

## 2015-06-22 ENCOUNTER — Ambulatory Visit: Payer: PRIVATE HEALTH INSURANCE | Attending: Pulmonary Disease

## 2015-06-26 ENCOUNTER — Ambulatory Visit: Payer: PRIVATE HEALTH INSURANCE | Admitting: Internal Medicine

## 2015-07-02 ENCOUNTER — Ambulatory Visit: Payer: Medicare Other | Attending: Pulmonary Disease

## 2015-07-02 DIAGNOSIS — G4733 Obstructive sleep apnea (adult) (pediatric): Secondary | ICD-10-CM | POA: Diagnosis present

## 2015-07-07 ENCOUNTER — Ambulatory Visit: Payer: PRIVATE HEALTH INSURANCE | Admitting: Internal Medicine

## 2015-07-11 ENCOUNTER — Ambulatory Visit (INDEPENDENT_AMBULATORY_CARE_PROVIDER_SITE_OTHER): Payer: Medicare Other | Admitting: Internal Medicine

## 2015-07-11 ENCOUNTER — Encounter: Payer: Self-pay | Admitting: Internal Medicine

## 2015-07-11 VITALS — BP 128/70 | HR 68 | Ht 69.0 in | Wt 212.0 lb

## 2015-07-11 DIAGNOSIS — G4733 Obstructive sleep apnea (adult) (pediatric): Secondary | ICD-10-CM

## 2015-07-11 MED ORDER — IPRATROPIUM BROMIDE 0.03 % NA SOLN: 1.0000 | Freq: Every day | NASAL | Status: DC

## 2015-07-11 NOTE — Addendum Note (Signed)
Addended by: Maryanna Shape A on: 07/11/2015 10:58 AM   Modules accepted: Orders

## 2015-07-11 NOTE — Progress Notes (Signed)
[facility] Consultation      Assessment and Plan:  Obstructive sleep apnea -Sleep study showed severe obstructive sleep apnea with an AHI of 83. The patient was subsequent sent for a BiPAP titration study. -Once the results of this BiPAP titration study are available, we will prescribe this level of BiPAP. -Once he is started on his new BiPAP settings, we'll order a overnight oximetry study on BiPAP, on room air.  Chronic rhinitis. -He's having nasal congestion every night, which limits how he continues with BiPAP. We'll start nasal atropine nightly.   Date: 07/11/2015  MRN# [id number] Trevor Ferguson Oct 29, 1925  Referring Physician:   RONDEL KEATING is a 81 y.o. old male seen in consultation for chief complaint of:    Chief Complaint  Patient presents with  . SLEEP CONSULT    pt. ref. by dr. Mortimer Fries. currently wears BIPAP 2hr. most nights. pressure is good. no supplies needed at this time. [id number]    HPI:   The patient is a 81 year old male with a history of COPD, diastolic congestive heart failure, cor pulmonale. He is referred by Dr. Mortimer Fries due to obstructive sleep apnea. He was known to have sleep apnea before and was sent for a repeat sleep study recently.  He is currently using the bipap, he has issues with claustrophobia and is using about 4 hours per night, every night.  He has been wearing it this way for about the last 5 years. His wife says that he no longer has apneas, and no longer snoring. He takes occasional naps in the afternoon.   He recently had a bipap titration study, but the results are not yet available. He   Review of testing: -Sleep study performed on 05/03/2015 showed an apnea-hypopnea index of 83. There were some central apneas noted as well. -BiPAP titration study performed on 06/20/2015; results not yet available.  -Echocardiogram 04/04/2015: Ejection fraction 55%, PAP 46 mmHg. --Overnight oximetry 06/10/15; desats of 43 minutes  below 88%, unreported whether this was on Bipap or not.   SIX MIN WALK 06/20/2015  Medications Aggrenox, Oscal, Breo @ 8:30am, Hyzaar, MVI, Fish Oil  Supplimental Oxygen during Test? (L/min) No  Laps 6  Partial Lap (in Meters) 18  Baseline BP (sitting) 130/68  Baseline Heartrate 79  Baseline Dyspnea (Borg Scale) 2  Baseline Fatigue (Borg Scale) 3  Baseline SPO2 97  BP (sitting) 158/70  Heartrate 104  Dyspnea (Borg Scale) 4  Fatigue (Borg Scale) 3  SPO2 95  BP (sitting) 142/60  Heartrate 90  SPO2 96  Stopped or Paused before Six Minutes No  Distance Completed 306  Tech Comments: Pt walked at moderate pace.    PMHX:   Past Medical History  Diagnosis Date  . COPD (chronic obstructive pulmonary disease) ([place])   . Hypertension   . Prostate cancer ([place])     with seed placement  . Stroke [facility]) 2012    no residuals  . Pneumonia     [id number] hospitalized  . Sleep apnea     noncompliant, dx 1996   Surgical Hx:  Past Surgical History  Procedure Laterality Date  . Tonsillectomy    . Appendectomy    . Adenoid    . Spine cyst    . Hemorrhoid surgery     Family Hx:  Family History  Problem Relation Age of Onset  . Congestive Heart Failure Mother   . COPD Sister   . Asthma Sister   . Tuberculosis Maternal Grandmother   .  Stroke Maternal Grandfather    Social Hx:   Social History  Substance Use Topics  . Smoking status: Former Smoker    Types: Cigarettes    Quit date: 08/19/1985  . Smokeless tobacco: Never Used  . Alcohol Use: Yes   Medication:   Current Outpatient Rx  Name  Route  Sig  Dispense  Refill  . albuterol (PROVENTIL HFA;VENTOLIN HFA) 108 (90 BASE) MCG/ACT inhaler   Inhalation   Inhale 2 puffs into the lungs every 6 (six) hours as needed for wheezing or shortness of breath.   1 Inhaler   2   . calcium carbonate (OS-CAL) 600 MG TABS tablet   Oral   Take 600 mg by mouth daily with breakfast.         . Cholecalciferol (VITAMIN D-3 PO)   Oral    Take 1,000 Int'l Units by mouth daily.         . clonazePAM (KLONOPIN) 0.25 MG disintegrating tablet   Oral   Take 1 tablet (0.25 mg total) by mouth 2 (two) times daily. Use for the night of the BiPAP titration   1 tablet   0   . dipyridamole-aspirin (AGGRENOX) 200-25 MG per 12 hr capsule   Oral   Take 1 capsule by mouth 2 (two) times daily.         . Fluticasone Furoate-Vilanterol 100-25 MCG/INH AEPB   Inhalation   Inhale 1 puff into the lungs daily.   60 each   3   . Loratadine (CLARITIN) 10 MG CAPS   Oral   Take 1 capsule by mouth daily.         Marland Kitchen losartan-hydrochlorothiazide (HYZAAR) 100-25 MG per tablet   Oral   Take 1 tablet by mouth daily.         . Multiple Vitamins-Minerals (CENTRAL-VITE PO)   Oral   Take 1 tablet by mouth daily.         . Omega-3 Fatty Acids (FISH OIL) 1000 MG CAPS   Oral   Take 1 capsule by mouth daily.         . Probiotic Product (PROBIOTIC ADVANCED PO)   Oral   Take 1 tablet by mouth daily.             Allergies:  Penicillins and Darvon  Review of Systems: Gen:  Denies  fever, sweats, chills HEENT: Denies blurred vision, double vision. bleeds, s Cvc:  No dizziness, chest pain. Resp:   Denies cough or sputum porduction,  Gi: Denies swallowing difficulty, stomach pain. Gu:  Denies bladder incontinence, burning urine Ext:   No Joint pain, stiffness. Skin: No skin rash,  hives Endoc:  No polyuria, polydipsia. Psych: No depression, insomnia. Other:  All other systems were reviewed with the patient and were negative other that what is mentioned in the HPI.   Physical Examination:   VS: BP 128/70 mmHg  Pulse 68  Ht 5\' 9"  (1.753 m)  Wt 212 lb (96.163 kg)  BMI 31.29 kg/m2  SpO2 94%  General Appearance: No distress  Neuro:without focal findings,  speech normal,  HEENT: PERRLA, EOM intact.   Pulmonary: normal breath sounds, No wheezing.  CardiovascularNormal S1,S2.  No m/r/g.   Abdomen: Benign, Soft,  non-tender. Renal:  No costovertebral tenderness  GU:  No performed at this time. Endoc: No evident thyromegaly, no signs of acromegaly. Skin:   warm, no rashes, no ecchymosis  Extremities: normal, no cyanosis, clubbing.  Other findings:    LABORATORY PANEL:  CBC No results for input(s): WBC, HGB, HCT, PLT in the last 168 hours. ------------------------------------------------------------------------------------------------------------------  Chemistries  No results for input(s): NA, K, CL, CO2, GLUCOSE, BUN, CREATININE, CALCIUM, MG, AST, ALT, ALKPHOS, BILITOT in the last 168 hours.  Invalid input(s): GFRCGP ------------------------------------------------------------------------------------------------------------------  Cardiac Enzymes No results for input(s): TROPONINI in the last 168 hours. ------------------------------------------------------------  RADIOLOGY:  No results found.     Thank  you for the consultation and for allowing [facility] to assist in the care of your patient. Our recommendations are noted above.  Please contact us if we can be of further service.   Marda Stalker, MD.  Board Certified in Internal Medicine, Pulmonary Medicine, [place], and Sleep Medicine.  [facility] Office Number: [phone]  Patricia Pesa, M.D.  Vilinda Boehringer, M.D.  Merton Border, M.D

## 2015-07-11 NOTE — Addendum Note (Signed)
Addended by: Oscar La R on: 07/11/2015 01:52 PM   Modules accepted: Orders

## 2015-07-11 NOTE — Patient Instructions (Addendum)
--  Try to wear your bipap all night, every night.  --Nasal ipratropium nasal spray, 1 spray each nostril every night.  -We will await the results of your BiPAP titration study, and then order BiPAP at that level. -When she was started on a new BiPAP setting, we may repeat the overnight oxygen test, while wearing BiPAP.    Sleep Apnea Sleep apnea is disorder that affects a person's sleep. A person with sleep apnea has abnormal pauses in their breathing when they sleep. It is hard for them to get a good sleep. This makes a person tired during the day. It also can lead to other physical problems. There are three types of sleep apnea. One type is when breathing stops for a short time because your airway is blocked (obstructive sleep apnea). Another type is when the brain sometimes fails to give the normal signal to breathe to the muscles that control your breathing (central sleep apnea). The third type is a combination of the other two types. HOME CARE   Take all medicine as told by your doctor.  Avoid alcohol, calming medicines (sedatives), and depressant drugs.  Try to lose weight if you are overweight. Talk to your doctor about a healthy weight goal.  Your doctor may have you use a device that helps to open your airway. It can help you get the air that you need. It is called a positive airway pressure (PAP) device.   MAKE SURE YOU:   Understand these instructions.  Will watch your condition.  Will get help right away if you are not doing well or get worse.  It may take approximately 1 month for you to get used to wearing her CPAP every night.  Be sure to work with

## 2015-07-18 ENCOUNTER — Ambulatory Visit (INDEPENDENT_AMBULATORY_CARE_PROVIDER_SITE_OTHER): Payer: Medicare Other | Admitting: Cardiovascular Disease

## 2015-07-18 ENCOUNTER — Encounter: Payer: Self-pay | Admitting: Cardiovascular Disease

## 2015-07-18 VITALS — BP 124/66 | HR 77 | Ht 69.0 in | Wt 207.2 lb

## 2015-07-18 DIAGNOSIS — I10 Essential (primary) hypertension: Secondary | ICD-10-CM | POA: Insufficient documentation

## 2015-07-18 DIAGNOSIS — J449 Chronic obstructive pulmonary disease, unspecified: Secondary | ICD-10-CM | POA: Diagnosis not present

## 2015-07-18 DIAGNOSIS — G4730 Sleep apnea, unspecified: Secondary | ICD-10-CM

## 2015-07-18 DIAGNOSIS — R0602 Shortness of breath: Secondary | ICD-10-CM | POA: Diagnosis not present

## 2015-07-18 DIAGNOSIS — I5032 Chronic diastolic (congestive) heart failure: Secondary | ICD-10-CM

## 2015-07-18 MED ORDER — POTASSIUM CHLORIDE ER 10 MEQ PO TBCR: 10.0000 meq | EXTENDED_RELEASE_TABLET | Freq: Every day | ORAL | Status: DC | PRN

## 2015-07-18 MED ORDER — FUROSEMIDE 20 MG PO TABS: 20.0000 mg | ORAL_TABLET | Freq: Every day | ORAL | Status: DC | PRN

## 2015-07-18 NOTE — Assessment & Plan Note (Signed)
Shortness of breath symptoms likely multifactorial including underlying COPD, chronic diastolic CHF/elevated right heart pressures, obesity, deconditioning

## 2015-07-18 NOTE — Assessment & Plan Note (Signed)
Mild to moderately elevated right heart pressures consistent with diastolic dysfunction/chronic diastolic CHF. He is mildly symptomatic with swelling in his feet, some abdominal swelling, shortness of breath Recommended he start very slowly with Lasix 20 mg and potassium 2-3 times per week And also suggested he take additional doses as needed if symptoms get worse Recent renal function is normal There was one BMP from 2015 with elevated BUN and creatinine so we'll proceed cautiously

## 2015-07-18 NOTE — Assessment & Plan Note (Signed)
Blood pressure is well controlled on today's visit. No changes made to the medications. 

## 2015-07-18 NOTE — Assessment & Plan Note (Signed)
Followed by pulmonary. Unable to tolerate his BiPAP. He is concerned that he may have oxygen desaturations when he sleeps that wake him up Per the patient, he is scheduled for sleep study with oxygen

## 2015-07-18 NOTE — Progress Notes (Signed)
Patient ID: Trevor Ferguson, male    DOB: September 05, 1925, 81 y.o.   MRN: [id number]  HPI Comments: Trevor Ferguson is a 81 yo pleasant white male with MILD COPD with chronic Bronchitis with DOE, sleep apnea on BiPAP, history of hypertension, stroke, presenting by referral from Dr. Mortimer Fries for evaluation of his diastolic heart failure. He reports previous carotid ultrasound with minimal carotid disease  He reports that he has a long smoking history. No regular exercise program but he does go to water aerobics 2 days per week, inactive other days Limited by his hip pain, chronic issue Unable to walk very far secondary to discomfort Weight is a chronic issue  Reports that he uses BiPAP at nighttime, most is able to use this for is 2.5 hours after that he pulls this off He has tried nasal spray as he was having nasal congestion, still with not much improvement Reports that he does have periods of desaturation down to 81% Scheduled to have repeat sleep study with oxygen in the future  He does report swelling in his feet, some fullness in his abdomen, periods of shortness of breath particularly on exertion  Recent echocardiogram showing normal LV function, mild to moderately elevated right heart pressures in the mid to high 40s No significant valvular disease  EKG on today's visit shows normal sinus rhythm with rate 77 bpm, no significant ST or T-wave changes  Allergies  Allergen Reactions  . Penicillins Rash, Other (See Comments) and Hives    Peeling around ankles, ?cough  . Darvon [Propoxyphene] Nausea Only and Rash    Current Outpatient Prescriptions on File Prior to Visit  Medication Sig Dispense Refill  . calcium carbonate (OS-CAL) 600 MG TABS tablet Take 600 mg by mouth daily with breakfast.    . Cholecalciferol (VITAMIN D-3 PO) Take 1,000 Int'l Units by mouth daily.    Marland Kitchen dipyridamole-aspirin (AGGRENOX) 200-25 MG per 12 hr capsule Take 1 capsule by mouth 2 (two) times daily.    . Fluticasone  Furoate-Vilanterol 100-25 MCG/INH AEPB Inhale 1 puff into the lungs daily. 60 each 3  . ipratropium (ATROVENT) 0.03 % nasal spray Place 1 spray into the nose daily. 30 mL 1  . losartan-hydrochlorothiazide (HYZAAR) 100-25 MG per tablet Take 1 tablet by mouth daily.    . Multiple Vitamins-Minerals (CENTRAL-VITE PO) Take 1 tablet by mouth daily.    . Omega-3 Fatty Acids (FISH OIL) 1000 MG CAPS Take 1 capsule by mouth daily.     No current facility-administered medications on file prior to visit.    Past Medical History  Diagnosis Date  . COPD (chronic obstructive pulmonary disease) ([place])   . Hypertension   . Prostate cancer ([place])     with seed placement  . Stroke [facility]) 2012    no residuals  . Pneumonia     [id number] hospitalized  . Sleep apnea     noncompliant, dx 1996    Past Surgical History  Procedure Laterality Date  . Tonsillectomy    . Appendectomy    . Adenoid    . Spine cyst    . Hemorrhoid surgery      Social History  reports that he quit smoking about 79 years ago. His smoking use included Cigarettes. He quit after 40 years of use. He has never used smokeless tobacco. He reports that he drinks alcohol. He reports that he does not use illicit drugs.  Family History family history includes Asthma in his sister; COPD in his sister;  Congestive Heart Failure in his mother; Stroke in his maternal grandfather; Tuberculosis in his maternal grandmother.       Review of Systems  Constitutional: Negative.   Eyes: Negative.   Respiratory: Positive for shortness of breath.   Cardiovascular: Positive for leg swelling.  Gastrointestinal: Negative.   Musculoskeletal: Negative.   Neurological: Negative.   Hematological: Negative.   Psychiatric/Behavioral: Negative.   All other systems reviewed and are negative.   BP 124/66 mmHg  Pulse 77  Ht 5\' 9"  (1.753 m)  Wt 207 lb 4 oz (94.008 kg)  BMI 30.59 kg/m2  Physical Exam  Constitutional: He is oriented to person,  place, and time. He appears well-developed and well-nourished.  HENT:  Head: Normocephalic.  Nose: Nose normal.  Mouth/Throat: Oropharynx is clear and moist.  Eyes: Conjunctivae are normal. Pupils are equal, round, and reactive to light.  Neck: Normal range of motion. Neck supple. No JVD present.  Cardiovascular: Normal rate, regular rhythm, normal heart sounds and intact distal pulses.  Exam reveals no gallop and no friction rub.   No murmur heard. No significant pitting edema  Pulmonary/Chest: Effort normal. No respiratory distress. He has decreased breath sounds. He has no wheezes. He has no rales. He exhibits no tenderness.  Abdominal: Soft. Bowel sounds are normal. He exhibits no distension. There is no tenderness.  Musculoskeletal: Normal range of motion. He exhibits no edema or tenderness.  Lymphadenopathy:    He has no cervical adenopathy.  Neurological: He is alert and oriented to person, place, and time. Coordination normal.  Skin: Skin is warm and dry. No rash noted. No erythema.  Psychiatric: He has a normal mood and affect. His behavior is normal. Judgment and thought content normal.

## 2015-07-18 NOTE — Patient Instructions (Signed)
You are doing well.  Please take lasix/furosemide with potassium 2 times per week and as needed for leg swelling, shortness of breath  Please call us if you have new issues that need to be addressed before your next appt.  Your physician wants you to follow-up in: 6 months.  You will receive a reminder letter in the mail two months in advance. If you don't receive a letter, please call our office to schedule the follow-up appointment.

## 2015-07-18 NOTE — Assessment & Plan Note (Signed)
Followed by Dr. Mortimer Fries, pulmonary Currently on inhalers May be contributing to elevated right heart pressures

## 2015-07-20 ENCOUNTER — Other Ambulatory Visit: Payer: Self-pay | Admitting: *Deleted

## 2015-07-20 DIAGNOSIS — G4733 Obstructive sleep apnea (adult) (pediatric): Secondary | ICD-10-CM

## 2015-07-31 ENCOUNTER — Telehealth: Payer: Self-pay | Admitting: *Deleted

## 2015-07-31 NOTE — Telephone Encounter (Signed)
Called and spoke with patient. Pt had a regular SS at the lab, had an ONO at home, and once this was done, had bilevel titration study at the lab.  Results are back and DR placed a referral for [employer] to change his bilevel to a pressure of 15/8.  Pt was under the impression that he was to have another study.  It is my understanding that just the pressure change on his BiPap to 15/8 and [facility] will supply him with any cpap supplies, mask, tubing etc.  Jeneen Rinks with [employer] spoke with office and they stated that [employer] had tried to contact patient but was unable to reach him b/c they were not calling pt on mobile number.  Jeneen Rinks with [employer] provided [employer] [employer] with the mobile number and [facility] is suppose to make contact with him today to arrange pressure change.  Will send this note to Dr. Ashby Dawes to advise if anything else is needed for this patient before note his closed.  Patient is aware of the above. Rhonda J Cobb

## 2015-07-31 NOTE — Telephone Encounter (Signed)
Pt calling asking about trying to get a home sleep study done, pt states he is waited over a week and has not heard anything.  Please advise

## 2015-08-01 NOTE — Telephone Encounter (Signed)
Nothing further to add.

## 2015-08-01 NOTE — Telephone Encounter (Signed)
After sending note to DR and nothing was added. This note can be closed.  Nothing else needed on this issue. Rhonda J Cobb

## 2015-09-08 ENCOUNTER — Telehealth: Payer: Self-pay | Admitting: Internal Medicine

## 2015-09-08 NOTE — Telephone Encounter (Signed)
Patient says someone called to set up appt.  End of April/ May is not open yet.  Patient thinks someone wanted him to fu before then as they recently called.  Asked about OD fu for Dr. Juanell Fairly but he doesn't want that appt.  Was told he needed to see Dr. Mortimer Fries.   No phone note recently about scheduling.  Please call to discuss or inform scheduling.

## 2015-09-08 NOTE — Telephone Encounter (Signed)
Called and LM for pt stating he isn't due to f/u with DR for 1 year and with [place] he is not due to f/u until 12/2015 and we haven't put that schedule out yet and if he has further question to give our office a call back. Nothing further needed.

## 2015-10-21 ENCOUNTER — Other Ambulatory Visit: Payer: Self-pay | Admitting: Internal Medicine

## 2015-12-18 ENCOUNTER — Ambulatory Visit: Payer: Medicare Other | Admitting: Internal Medicine

## 2015-12-18 ENCOUNTER — Ambulatory Visit (INDEPENDENT_AMBULATORY_CARE_PROVIDER_SITE_OTHER): Payer: Medicare Other | Admitting: Cardiovascular Disease

## 2015-12-18 ENCOUNTER — Encounter: Payer: Self-pay | Admitting: Cardiovascular Disease

## 2015-12-18 VITALS — BP 120/60 | HR 71 | Ht 69.0 in | Wt 206.2 lb

## 2015-12-18 DIAGNOSIS — R0602 Shortness of breath: Secondary | ICD-10-CM

## 2015-12-18 DIAGNOSIS — I10 Essential (primary) hypertension: Secondary | ICD-10-CM

## 2015-12-18 DIAGNOSIS — R600 Localized edema: Secondary | ICD-10-CM | POA: Insufficient documentation

## 2015-12-18 DIAGNOSIS — I5032 Chronic diastolic (congestive) heart failure: Secondary | ICD-10-CM

## 2015-12-18 DIAGNOSIS — J449 Chronic obstructive pulmonary disease, unspecified: Secondary | ICD-10-CM

## 2015-12-18 DIAGNOSIS — M7989 Other specified soft tissue disorders: Secondary | ICD-10-CM

## 2015-12-18 NOTE — Assessment & Plan Note (Signed)
Followed by pulmonary, Dr. Mortimer Fries. Does not appear to be very symptomatic so recommended he needs to start a exercise program on other days of the week than the 2 days he is currently doing with water aerobics. Long discussion concerning his weight, conditioning. Overall good for his age but perhaps could do more.

## 2015-12-18 NOTE — Assessment & Plan Note (Signed)
He is concerned about toe swelling. Given your lab work showing prerenal state, toe swelling likely unrelated to any cardiac issues. Likely has component of venous insufficiency, recommended leg movement, compression socks when he is sitting at his computer, leg elevation when watching TV.   Total encounter time more than 25 minutes  Greater than 50% was spent in counseling and coordination of care with the patient

## 2015-12-18 NOTE — Assessment & Plan Note (Signed)
Recommended that he stay on Lasix 20 g daily Lab work reviewed with him showing stable potassium, slight climb and BUN and creatinine, possibly borderline prerenal. Leg edema has resolved apart from swelling of his toes (?)

## 2015-12-18 NOTE — Assessment & Plan Note (Signed)
Blood pressure is well controlled on today's visit. No changes made to the medications. 

## 2015-12-18 NOTE — Assessment & Plan Note (Addendum)
Shortness of breath seems mild, likely multi-factorial including diastolic CHF which has been treated, COPD stop smoking 40 years ago, obesity and deconditioning

## 2015-12-18 NOTE — Patient Instructions (Addendum)
You are doing well. No medication changes were made.  Please call us if you have new issues that need to be addressed before your next appt.  Your physician wants you to follow-up in: 12 months.  You will receive a reminder letter in the mail two months in advance. If you don't receive a letter, please call our office to schedule the follow-up appointment. 

## 2015-12-18 NOTE — Progress Notes (Signed)
Patient ID: Trevor Ferguson, male    DOB: [birth year], 82 y.o.   MRN: [id number]  HPI Comments: Mr. Mathus is a 82 yo pleasant white male with MILD COPD with chronic Bronchitis with DOE, sleep apnea on BiPAP, history of hypertension, stroke,  with history of chronic diastolic heart failure, prior echocardiogram with mild to moderately elevated right heart pressures in August 2016, normal ejection fraction. Long smoking history He reports previous carotid ultrasound with minimal carotid disease He presents today for routine follow-up of his shortness of breath  Wife presents with him today, reports that he is sedentary  does go to water aerobics 2 days per week, inactive other days Limited by his hip and back pain, chronic issue Unable to walk very far secondary to discomfort Weight is a chronic issue Wife would like to see him exercise more, lose weight, reports they're both sedentary  uses BiPAP at nighttime, unable to tolerate well Reports that he does have periods of desaturation down to 81%  Continues to have swelling of his toes Unclear if he has had much benefit from Lasix 20 mg daily, wife feels his breathing is about the same  Lab work reviewed with him showing total cholesterol 128, LDL 71, BUN 23, creatinine 1.3  EKG on today's visit shows normal sinus rhythm with rate 70 bpm, rare APC, no significant ST or T-wave changes    Allergies  Allergen Reactions  . Penicillins Rash, Other (See Comments) and Hives    Peeling around ankles, ?cough  . Darvon [Propoxyphene] Nausea Only and Rash    Current Outpatient Prescriptions on File Prior to Visit  Medication Sig Dispense Refill  . Albuterol Sulfate 108 (90 BASE) MCG/ACT AEPB Inhale into the lungs as needed.    Marland Kitchen BREO ELLIPTA 100-25 MCG/INH AEPB INHALE 1 PUFF INTO THE LUNGS DAILY 60 each 3  . Cholecalciferol (VITAMIN D-3 PO) Take 1,000 Int'l Units by mouth daily.    Marland Kitchen dipyridamole-aspirin (AGGRENOX) 200-25 MG per 12 hr capsule  Take 1 capsule by mouth 2 (two) times daily.    . furosemide (LASIX) 20 MG tablet Take 1 tablet (20 mg total) by mouth daily as needed. 30 tablet 6  . losartan-hydrochlorothiazide (HYZAAR) 100-25 MG per tablet Take 1 tablet by mouth daily.    . Multiple Vitamins-Minerals (CENTRAL-VITE PO) Take 1 tablet by mouth daily.    . Omega-3 Fatty Acids (FISH OIL) 1000 MG CAPS Take 1 capsule by mouth daily.    . potassium chloride (K-DUR) 10 MEQ tablet Take 1 tablet (10 mEq total) by mouth daily as needed. 30 tablet 6   No current facility-administered medications on file prior to visit.    Past Medical History  Diagnosis Date  . COPD (chronic obstructive pulmonary disease) ([place])   . Hypertension   . Prostate cancer ([place])     with seed placement  . Stroke [facility]) 2012    no residuals  . Pneumonia     [id number] hospitalized  . Sleep apnea     noncompliant, dx 1996    Past Surgical History  Procedure Laterality Date  . Tonsillectomy    . Appendectomy    . Adenoid    . Spine cyst    . Hemorrhoid surgery      Social History  reports that he quit smoking about 30 years ago. His smoking use included Cigarettes. He quit after 40 years of use. He has never used smokeless tobacco. He reports that he drinks alcohol. He reports  that he does not use illicit drugs.  Family History family history includes Asthma in his sister; COPD in his sister; Congestive Heart Failure in his mother; Stroke in his maternal grandfather; Tuberculosis in his maternal grandmother.   Review of Systems  Constitutional: Negative.   Eyes: Negative.   Respiratory: Positive for shortness of breath.   Cardiovascular: Positive for leg swelling.  Gastrointestinal: Negative.   Musculoskeletal: Negative.   Neurological: Negative.   Hematological: Negative.   Psychiatric/Behavioral: Negative.   All other systems reviewed and are negative.   BP 120/60 mmHg  Pulse 71  Ht 5\' 9"  (1.753 m)  Wt 206 lb 4 oz (93.554 kg)  BMI  30.44 kg/m2  Physical Exam  Constitutional: He is oriented to person, place, and time. He appears well-developed and well-nourished.  Obese  HENT:  Head: Normocephalic.  Nose: Nose normal.  Mouth/Throat: Oropharynx is clear and moist.  Eyes: Conjunctivae are normal. Pupils are equal, round, and reactive to light.  Neck: Normal range of motion. Neck supple. No JVD present.  Cardiovascular: Normal rate, regular rhythm, normal heart sounds and intact distal pulses.  Exam reveals no gallop and no friction rub.   No murmur heard. No significant pitting edema  Pulmonary/Chest: Effort normal. No respiratory distress. He has decreased breath sounds. He has no wheezes. He has no rales. He exhibits no tenderness.  Abdominal: Soft. Bowel sounds are normal. He exhibits no distension. There is no tenderness.  Musculoskeletal: Normal range of motion. He exhibits no edema or tenderness.  Lymphadenopathy:    He has no cervical adenopathy.  Neurological: He is alert and oriented to person, place, and time. Coordination normal.  Skin: Skin is warm and dry. No rash noted. No erythema.  Psychiatric: He has a normal mood and affect. His behavior is normal. Judgment and thought content normal.

## 2015-12-27 ENCOUNTER — Ambulatory Visit: Payer: Medicare Other | Admitting: Internal Medicine

## 2016-01-23 ENCOUNTER — Encounter: Payer: Self-pay | Admitting: Internal Medicine

## 2016-01-23 ENCOUNTER — Ambulatory Visit: Payer: Medicare Other | Admitting: Internal Medicine

## 2016-01-23 ENCOUNTER — Ambulatory Visit (INDEPENDENT_AMBULATORY_CARE_PROVIDER_SITE_OTHER): Payer: Medicare Other | Admitting: Internal Medicine

## 2016-01-23 VITALS — BP 126/82 | HR 55 | Ht 69.0 in | Wt 205.0 lb

## 2016-01-23 DIAGNOSIS — J449 Chronic obstructive pulmonary disease, unspecified: Secondary | ICD-10-CM

## 2016-01-23 MED ORDER — CETIRIZINE HCL 10 MG PO CAPS: 10.0000 mg | ORAL_CAPSULE | Freq: Every day | ORAL | Status: DC

## 2016-01-23 NOTE — Progress Notes (Signed)
Date: 01/23/2016,   MRN# [id number] Trevor Ferguson Sep 15, 1925 Code Status:  Hosp day:@LENGTHOFSTAYDAYS @ Referring MD: @ATDPROV @     PCP:      AdmissionWeight: 205 lb (92.987 kg)                 CurrentWeight: 205 lb (92.987 kg) Trevor Ferguson is a 82 y.o. old male seen in consultation for SOB with exertion.  PFT's: 11/1 Fev 1 2.13L 87%predicted Ratio 71% predicted DLCO 48% predicted TLC 4.9 6MWT WNL  CHIEF COMPLAINT:   SOB with exertion, follow up PFT's and 6MWT   HISTORY OF PRESENT ILLNESS  Patient with SOB and some cough 2 times in last month, resolved with albuterol inhlaer  03/2015 CXR shows flattened diaphragms reviewed ECHO shows grade 1 diastolic dysfunction Overnight pulse ox reveals desats 113 times, patient needs Oxygen at night PFT's c/w mild obstructive airways disease Still with some cough, no signs of infection at this time Patient would like to see if he does well off of breo-ellipta, still taking breo now Uses albuterol as needed  6MWT WNL Ratio 71%, Fev1 87%, FVC 84% DLCO 48%     Current Medication:  Current outpatient prescriptions:  .  Albuterol Sulfate 108 (90 BASE) MCG/ACT AEPB, Inhale into the lungs as needed., Disp: , Rfl:  .  BREO ELLIPTA 100-25 MCG/INH AEPB, INHALE 1 PUFF INTO THE LUNGS DAILY, Disp: 60 each, Rfl: 3 .  Cholecalciferol (VITAMIN D-3 PO), Take 1,000 Int'l Units by mouth daily., Disp: , Rfl:  .  dipyridamole-aspirin (AGGRENOX) 200-25 MG per 12 hr capsule, Take 1 capsule by mouth 2 (two) times daily., Disp: , Rfl:  .  escitalopram (LEXAPRO) 5 MG tablet, Take 5 mg by mouth daily., Disp: , Rfl:  .  furosemide (LASIX) 20 MG tablet, Take 1 tablet (20 mg total) by mouth daily as needed., Disp: 30 tablet, Rfl: 6 .  losartan-hydrochlorothiazide (HYZAAR) 100-25 MG per tablet, Take 1 tablet by mouth daily., Disp: , Rfl:  .  Multiple Vitamins-Minerals (CENTRAL-VITE PO), Take 1 tablet by mouth daily., Disp: , Rfl:  .  Omega-3 Fatty Acids (FISH  OIL) 1000 MG CAPS, Take 1 capsule by mouth daily., Disp: , Rfl:  .  potassium chloride (K-DUR) 10 MEQ tablet, Take 1 tablet (10 mEq total) by mouth daily as needed., Disp: 30 tablet, Rfl: 6    ALLERGIES   Penicillins and Darvon     REVIEW OF SYSTEMS   Review of Systems  Constitutional: Negative for fever and chills.  Respiratory: Negative for cough, sputum production and shortness of breath.   Cardiovascular: Negative for chest pain and leg swelling.  Gastrointestinal: Negative.   Skin: Negative for rash.  All other systems reviewed and are negative.    VS: BP 126/82 mmHg  Pulse 55  Ht 5\' 9"  (1.753 m)  Wt 205 lb (92.987 kg)  BMI 30.26 kg/m2  SpO2 93%     PHYSICAL EXAM  Physical Exam  Constitutional: He appears well-developed and well-nourished.  Cardiovascular: Normal rate, regular rhythm and normal heart sounds.   No murmur heard. Pulmonary/Chest: No respiratory distress. He has no wheezes.  Abdominal: He exhibits no distension. There is no tenderness. There is no rebound.  Musculoskeletal: Normal range of motion. He exhibits no edema.  Psychiatric: He has a normal mood and affect.      ASSESSMENT/PLAN    82 yo pleasant white male with MILD COPD with chronic Bronchitis with DOE associated with LE edema with signs and symptoms  of sleep apnea which could be related to Cor pumonale/RT heart strain and Grade 1 diastolic dysfunction. CXR and ECHO reveiwed with patient  1.albuterol as needed 2.will see if patient does well off breo-ellipta 3.needs oxygen as night 4.start Zyrtec 10 mg at night  Follow up in 3 months    The Patient requires high complexity decision making for assessment and support, frequent evaluation and titration of therapies, application of advanced monitoring technologies and extensive interpretation of multiple databases.  Patient is satisfied with Plan of action and management.    [name], M.D.  [name]  Medical Director [place] Director [facility] Cardio-Pulmonary Department

## 2016-01-23 NOTE — Patient Instructions (Signed)
Chronic Obstructive Pulmonary Disease Chronic obstructive pulmonary disease (COPD) is a common lung condition in which airflow from the lungs is limited. COPD is a general term that can be used to describe many different lung problems that limit airflow, including both chronic bronchitis and emphysema. If you have COPD, your lung function will probably never return to normal, but there are measures you can take to improve lung function and make yourself feel better. CAUSES   Smoking (common).  Exposure to secondhand smoke.  Genetic problems.  Chronic inflammatory lung diseases or recurrent infections. SYMPTOMS  Shortness of breath, especially with physical activity.  Deep, persistent (chronic) cough with a large amount of thick mucus.  Wheezing.  Rapid breaths (tachypnea).  Gray or bluish discoloration (cyanosis) of the skin, especially in your fingers, toes, or lips.  Fatigue.  Weight loss.  Frequent infections or episodes when breathing symptoms become much worse (exacerbations).  Chest tightness. DIAGNOSIS Your health care provider will take a medical history and perform a physical examination to diagnose COPD. Additional tests for COPD may include:  Lung (pulmonary) function tests.  Chest X-ray.  CT scan.  Blood tests. TREATMENT  Treatment for COPD may include:  Inhaler and nebulizer medicines. These help manage the symptoms of COPD and make your breathing more comfortable.  Supplemental oxygen. Supplemental oxygen is only helpful if you have a low oxygen level in your blood.  Exercise and physical activity. These are beneficial for nearly all people with COPD.  Lung surgery or transplant.  Nutrition therapy to gain weight, if you are underweight.  Pulmonary rehabilitation. This may involve working with a team of health care providers and specialists, such as respiratory, occupational, and physical therapists. HOME CARE INSTRUCTIONS  Take all medicines  (inhaled or pills) as directed by your health care provider.  Avoid over-the-counter medicines or cough syrups that dry up your airway (such as antihistamines) and slow down the elimination of secretions unless instructed otherwise by your health care provider.  If you are a smoker, the most important thing that you can do is stop smoking. Continuing to smoke will cause further lung damage and breathing trouble. Ask your health care provider for help with quitting smoking. He or she can direct you to community resources or hospitals that provide support.  Avoid exposure to irritants such as smoke, chemicals, and fumes that aggravate your breathing.  Use oxygen therapy and pulmonary rehabilitation if directed by your health care provider. If you require home oxygen therapy, ask your health care provider whether you should purchase a pulse oximeter to measure your oxygen level at home.  Avoid contact with individuals who have a contagious illness.  Avoid extreme temperature and humidity changes.  Eat healthy foods. Eating smaller, more frequent meals and resting before meals may help you maintain your strength.  Stay active, but balance activity with periods of rest. Exercise and physical activity will help you maintain your ability to do things you want to do.  Preventing infection and hospitalization is very important when you have COPD. Make sure to receive all the vaccines your health care provider recommends, especially the pneumococcal and influenza vaccines. Ask your health care provider whether you need a pneumonia vaccine.  Learn and use relaxation techniques to manage stress.  Learn and use controlled breathing techniques as directed by your health care provider. Controlled breathing techniques include:  Pursed lip breathing. Start by breathing in (inhaling) through your nose for 1 second. Then, purse your lips as if you were   going to whistle and breathe out (exhale) through the  pursed lips for 2 seconds.  Diaphragmatic breathing. Start by putting one hand on your abdomen just above your waist. Inhale slowly through your nose. The hand on your abdomen should move out. Then purse your lips and exhale slowly. You should be able to feel the hand on your abdomen moving in as you exhale.  Learn and use controlled coughing to clear mucus from your lungs. Controlled coughing is a series of short, progressive coughs. The steps of controlled coughing are: 1. Lean your head slightly forward. 2. Breathe in deeply using diaphragmatic breathing. 3. Try to hold your breath for 3 seconds. 4. Keep your mouth slightly open while coughing twice. 5. Spit any mucus out into a tissue. 6. Rest and repeat the steps once or twice as needed. SEEK MEDICAL CARE IF:  You are coughing up more mucus than usual.  There is a change in the color or thickness of your mucus.  Your breathing is more labored than usual.  Your breathing is faster than usual. SEEK IMMEDIATE MEDICAL CARE IF:  You have shortness of breath while you are resting.  You have shortness of breath that prevents you from:  Being able to talk.  Performing your usual physical activities.  You have chest pain lasting longer than 5 minutes.  Your skin color is more cyanotic than usual.  You measure low oxygen saturations for longer than 5 minutes with a pulse oximeter. MAKE SURE YOU:  Understand these instructions.  Will watch your condition.  Will get help right away if you are not doing well or get worse.   This information is not intended to replace advice given to you by your health care provider. Make sure you discuss any questions you have with your health care provider.   Document Released: 05/15/2005 Document Revised: 08/26/2014 Document Reviewed: 04/01/2013 Elsevier Interactive Patient Education 2016 [employer]  

## 2016-02-28 ENCOUNTER — Other Ambulatory Visit: Payer: Self-pay | Admitting: Cardiovascular Disease

## 2016-03-07 ENCOUNTER — Other Ambulatory Visit: Payer: Self-pay | Admitting: Internal Medicine

## 2016-04-24 ENCOUNTER — Ambulatory Visit (INDEPENDENT_AMBULATORY_CARE_PROVIDER_SITE_OTHER): Payer: Medicare Other | Admitting: Internal Medicine

## 2016-04-24 ENCOUNTER — Encounter: Payer: Self-pay | Admitting: Internal Medicine

## 2016-04-24 VITALS — BP 126/72 | HR 61 | Ht 69.5 in | Wt 204.0 lb

## 2016-04-24 DIAGNOSIS — R0602 Shortness of breath: Secondary | ICD-10-CM

## 2016-04-24 DIAGNOSIS — J449 Chronic obstructive pulmonary disease, unspecified: Secondary | ICD-10-CM | POA: Diagnosis not present

## 2016-04-24 MED ORDER — FLUTICASONE FUROATE-VILANTEROL 100-25 MCG/INH IN AEPB: 1.0000 | INHALATION_SPRAY | Freq: Every day | RESPIRATORY_TRACT | 0 refills | Status: AC

## 2016-04-24 MED ORDER — CETIRIZINE HCL 10 MG PO CAPS: 10.0000 mg | ORAL_CAPSULE | Freq: Every day | ORAL | 5 refills | Status: DC

## 2016-04-24 NOTE — Addendum Note (Signed)
Addended by: Maryanna Shape A on: 04/24/2016 12:13 PM   Modules accepted: Orders

## 2016-04-24 NOTE — Progress Notes (Signed)
Date: 04/24/2016,   MRN# [id number] Trevor Ferguson 1925-12-22 Code Status:  Hosp day:@LENGTHOFSTAYDAYS @ Referring MD: @ATDPROV @     PCP:      AdmissionWeight: 204 lb (92.5 kg)                 CurrentWeight: 204 lb (92.5 kg) Trevor Ferguson is a 82 y.o. old male seen in consultation for SOB with exertion.  PFT's: 11/1 Fev 1 2.13L 87%predicted Ratio 71% predicted DLCO 48% predicted TLC 4.9 6MWT WNL  CHIEF COMPLAINT:  Follow up SOB   HISTORY OF PRESENT ILLNESS  Patient with SOB controlled with BREO  03/2015 CXR shows flattened diaphragms reviewed ECHO shows grade 1 diastolic dysfunction Overnight pulse ox reveals desats 113 times, patient needs Oxygen at night PFT's c/w mild obstructive airways disease Still with some cough and SOB, no signs of infection at this time Uses albuterol as needed  6MWT WNL Ratio 71%, Fev1 87%, FVC 84% DLCO 48%     Current Medication:  Current Outpatient Prescriptions:  .  Albuterol Sulfate 108 (90 BASE) MCG/ACT AEPB, Inhale into the lungs as needed., Disp: , Rfl:  .  BREO ELLIPTA 100-25 MCG/INH AEPB, take 1 inhalation by mouth once daily, Disp: 60 each, Rfl: 3 .  Cholecalciferol (VITAMIN D-3 PO), Take 1,000 Int'l Units by mouth daily., Disp: , Rfl:  .  dipyridamole-aspirin (AGGRENOX) 200-25 MG per 12 hr capsule, Take 1 capsule by mouth 2 (two) times daily., Disp: , Rfl:  .  escitalopram (LEXAPRO) 5 MG tablet, Take 5 mg by mouth daily., Disp: , Rfl:  .  furosemide (LASIX) 20 MG tablet, Take 1 tablet (20 mg total) by mouth daily as needed., Disp: 30 tablet, Rfl: 6 .  losartan-hydrochlorothiazide (HYZAAR) 100-25 MG per tablet, Take 1 tablet by mouth daily., Disp: , Rfl:  .  Multiple Vitamins-Minerals (CENTRAL-VITE PO), Take 1 tablet by mouth daily., Disp: , Rfl:  .  Omega-3 Fatty Acids (FISH OIL) 1000 MG CAPS, Take 1 capsule by mouth daily., Disp: , Rfl:  .  potassium chloride (K-DUR) 10 MEQ tablet, take 1 tablet by mouth once daily if needed, Disp:  30 tablet, Rfl: 3 .  Cetirizine HCl (ZYRTEC ALLERGY) 10 MG CAPS, Take 1 capsule (10 mg total) by mouth at bedtime. (Patient not taking: Reported on 04/24/2016), Disp: 30 capsule, Rfl: 2    ALLERGIES   Penicillins and Darvon [propoxyphene]     REVIEW OF SYSTEMS   Review of Systems  Constitutional: Negative for chills and fever.  Respiratory: Negative for cough, sputum production and shortness of breath.   Cardiovascular: Negative for chest pain and leg swelling.  Gastrointestinal: Negative.   Skin: Negative for rash.  All other systems reviewed and are negative.    VS: BP 126/72 (BP Location: Left Arm, Cuff Size: Normal)   Pulse 61   Ht 5' 9.5" (1.765 m)   Wt 204 lb (92.5 kg)   SpO2 98%   BMI 29.69 kg/m      PHYSICAL EXAM  Physical Exam  Constitutional: He appears well-developed and well-nourished.  Cardiovascular: Normal rate, regular rhythm and normal heart sounds.   No murmur heard. Pulmonary/Chest: No respiratory distress. He has no wheezes.  Abdominal: He exhibits no distension. There is no tenderness. There is no rebound.  Musculoskeletal: Normal range of motion. He exhibits no edema.  Psychiatric: He has a normal mood and affect.      ASSESSMENT/PLAN    82 yo pleasant white male with MILD COPD with  chronic Bronchitis with DOE associated with LE edema with OSA with chronic hypoxic resp failure  which could be related to Cor pumonale/RT heart strain and Grade 1 diastolic dysfunction.   Patient is noncompliant with BIPAP and refuses to wear it  1.albuterol as needed 2.continue BREO 3.needs oxygen as night 4.start Zyrtec 10 mg at night  Follow up in 6 months    The Patient requires high complexity decision making for assessment and support, frequent evaluation and titration of therapies, application of advanced monitoring technologies and extensive interpretation of multiple databases.  Patient is satisfied with Plan of action and  management.    [name], M.D.  [name]  Medical Director [place] Director [facility] Cardio-Pulmonary Department

## 2016-04-24 NOTE — Patient Instructions (Addendum)
Continue BREO ALBUTEROL AS NEEDED Zyrtec at night   Chronic Obstructive Pulmonary Disease Chronic obstructive pulmonary disease (COPD) is a common lung condition in which airflow from the lungs is limited. COPD is a general term that can be used to describe many different lung problems that limit airflow, including both chronic bronchitis and emphysema. If you have COPD, your lung function will probably never return to normal, but there are measures you can take to improve lung function and make yourself feel better. CAUSES   Smoking (common).  Exposure to secondhand smoke.  Genetic problems.  Chronic inflammatory lung diseases or recurrent infections. SYMPTOMS  Shortness of breath, especially with physical activity.  Deep, persistent (chronic) cough with a large amount of thick mucus.  Wheezing.  Rapid breaths (tachypnea).  Gray or bluish discoloration (cyanosis) of the skin, especially in your fingers, toes, or lips.  Fatigue.  Weight loss.  Frequent infections or episodes when breathing symptoms become much worse (exacerbations).  Chest tightness. DIAGNOSIS Your health care provider will take a medical history and perform a physical examination to diagnose COPD. Additional tests for COPD may include:  Lung (pulmonary) function tests.  Chest X-ray.  CT scan.  Blood tests. TREATMENT  Treatment for COPD may include:  Inhaler and nebulizer medicines. These help manage the symptoms of COPD and make your breathing more comfortable.  Supplemental oxygen. Supplemental oxygen is only helpful if you have a low oxygen level in your blood.  Exercise and physical activity. These are beneficial for nearly all people with COPD.  Lung surgery or transplant.  Nutrition therapy to gain weight, if you are underweight.  Pulmonary rehabilitation. This may involve working with a team of health care providers and specialists, such as respiratory, occupational, and physical  therapists. HOME CARE INSTRUCTIONS  Take all medicines (inhaled or pills) as directed by your health care provider.  Avoid over-the-counter medicines or cough syrups that dry up your airway (such as antihistamines) and slow down the elimination of secretions unless instructed otherwise by your health care provider.  If you are a smoker, the most important thing that you can do is stop smoking. Continuing to smoke will cause further lung damage and breathing trouble. Ask your health care provider for help with quitting smoking. He or she can direct you to community resources or hospitals that provide support.  Avoid exposure to irritants such as smoke, chemicals, and fumes that aggravate your breathing.  Use oxygen therapy and pulmonary rehabilitation if directed by your health care provider. If you require home oxygen therapy, ask your health care provider whether you should purchase a pulse oximeter to measure your oxygen level at home.  Avoid contact with individuals who have a contagious illness.  Avoid extreme temperature and humidity changes.  Eat healthy foods. Eating smaller, more frequent meals and resting before meals may help you maintain your strength.  Stay active, but balance activity with periods of rest. Exercise and physical activity will help you maintain your ability to do things you want to do.  Preventing infection and hospitalization is very important when you have COPD. Make sure to receive all the vaccines your health care provider recommends, especially the pneumococcal and influenza vaccines. Ask your health care provider whether you need a pneumonia vaccine.  Learn and use relaxation techniques to manage stress.  Learn and use controlled breathing techniques as directed by your health care provider. Controlled breathing techniques include:  Pursed lip breathing. Start by breathing in (inhaling) through your nose for  1 second. Then, purse your lips as if you were  going to whistle and breathe out (exhale) through the pursed lips for 2 seconds.  Diaphragmatic breathing. Start by putting one hand on your abdomen just above your waist. Inhale slowly through your nose. The hand on your abdomen should move out. Then purse your lips and exhale slowly. You should be able to feel the hand on your abdomen moving in as you exhale.  Learn and use controlled coughing to clear mucus from your lungs. Controlled coughing is a series of short, progressive coughs. The steps of controlled coughing are: 1. Lean your head slightly forward. 2. Breathe in deeply using diaphragmatic breathing. 3. Try to hold your breath for 3 seconds. 4. Keep your mouth slightly open while coughing twice. 5. Spit any mucus out into a tissue. 6. Rest and repeat the steps once or twice as needed. SEEK MEDICAL CARE IF:  You are coughing up more mucus than usual.  There is a change in the color or thickness of your mucus.  Your breathing is more labored than usual.  Your breathing is faster than usual. SEEK IMMEDIATE MEDICAL CARE IF:  You have shortness of breath while you are resting.  You have shortness of breath that prevents you from:  Being able to talk.  Performing your usual physical activities.  You have chest pain lasting longer than 5 minutes.  Your skin color is more cyanotic than usual.  You measure low oxygen saturations for longer than 5 minutes with a pulse oximeter. MAKE SURE YOU:  Understand these instructions.  Will watch your condition.  Will get help right away if you are not doing well or get worse.   This information is not intended to replace advice given to you by your health care provider. Make sure you discuss any questions you have with your health care provider.   Document Released: 05/15/2005 Document Revised: 08/26/2014 Document Reviewed: 04/01/2013 Elsevier Interactive Patient Education [employer].

## 2016-05-10 ENCOUNTER — Other Ambulatory Visit: Payer: Self-pay | Admitting: Cardiovascular Disease

## 2016-05-15 ENCOUNTER — Encounter: Payer: Self-pay | Admitting: Internal Medicine

## 2016-05-15 DIAGNOSIS — R0602 Shortness of breath: Secondary | ICD-10-CM

## 2016-05-15 DIAGNOSIS — J449 Chronic obstructive pulmonary disease, unspecified: Secondary | ICD-10-CM

## 2016-05-16 ENCOUNTER — Telehealth: Payer: Self-pay | Admitting: Internal Medicine

## 2016-05-16 MED ORDER — BENZONATATE 100 MG PO CAPS: 100.0000 mg | ORAL_CAPSULE | Freq: Three times a day (TID) | ORAL | 1 refills | Status: DC | PRN

## 2016-05-16 NOTE — Telephone Encounter (Signed)
Pt states he was dx with bronchitis about 2 weeks ago, pt has had 2 rounds of abx, depo shot & prednisone taper with no improvement on cough. Pt c/o prod cough with clear to yellow mucus, denies any fever, sweats or chills. Pt states she had an cxr that showed no signs of PNA.  Per DK verbally tessalon 100mg  tid prn #60  Pt aware and voiced understanding. Nothing further needed.

## 2016-05-16 NOTE — Telephone Encounter (Signed)
Pt spouse calling stating pt has had Bronchitisfor over 2w or longer , they would like to be seen/not getting any better  Please advise

## 2016-06-06 ENCOUNTER — Telehealth: Payer: Self-pay | Admitting: *Deleted

## 2016-06-06 ENCOUNTER — Telehealth: Payer: Self-pay | Admitting: Internal Medicine

## 2016-06-06 DIAGNOSIS — J449 Chronic obstructive pulmonary disease, unspecified: Secondary | ICD-10-CM

## 2016-06-06 DIAGNOSIS — R0600 Dyspnea, unspecified: Secondary | ICD-10-CM

## 2016-06-06 NOTE — Telephone Encounter (Signed)
Pt returning our call °Please call back ° °

## 2016-06-06 NOTE — Telephone Encounter (Signed)
Pt informed of results. Order for O2 placed. Nothing further needed.

## 2016-06-06 NOTE — Telephone Encounter (Signed)
Per Jiles Crocker with [facility], [facility] will not be able to provide patient with o2 b/c he has a diagnosis of OSA. Advised Corene Cornea that pt is not wearing BiPap and also has a diagnosis of COPD. Per Corene Cornea with pt's insurance if pt has a diagnosis of OSA the treatment per his insurance is CPAP/Bipap.  The patient will have several options, to private pay for the o2, to come in and have a SMW to see if he qualifies for o2 with exertion, or to have an ONO on BiPap to show that pt needs o2 in addition to the [place].  Pt's insurance states that if pt has OSA and is on BiPap that o2 alone can actually do more harm than good.  Corene Cornea has sent the [facility] guidelines for this and will be glad to provide that for you.  Please advise what you would like [place] to do. Rhonda J Cobb

## 2016-06-06 NOTE — Telephone Encounter (Signed)
LMOM for pt to return call for ONO results. 

## 2016-06-07 NOTE — Telephone Encounter (Signed)
Go [facility]

## 2016-06-10 NOTE — Telephone Encounter (Signed)
Per Dr. Mortimer Fries, please order SMW for pt to see if he will qualify for o2. [place] Please place order for SMW. Rhonda J Cobb

## 2016-06-10 NOTE — Telephone Encounter (Signed)
Order placed for SMW.

## 2016-06-11 NOTE — Telephone Encounter (Signed)
Patient scheduled for 06/18/16 at 330 for smw.

## 2016-06-11 NOTE — Telephone Encounter (Signed)
Bring patient in for SMW per Dr. Mortimer Fries.  Order placed and will call patient to schedule. Rhonda J Cobb

## 2016-06-18 ENCOUNTER — Ambulatory Visit (INDEPENDENT_AMBULATORY_CARE_PROVIDER_SITE_OTHER): Payer: Medicare Other | Admitting: *Deleted

## 2016-06-18 DIAGNOSIS — R0600 Dyspnea, unspecified: Secondary | ICD-10-CM | POA: Diagnosis not present

## 2016-06-18 NOTE — Progress Notes (Signed)
SMW performed today. 

## 2016-07-07 ENCOUNTER — Other Ambulatory Visit: Payer: Self-pay | Admitting: Cardiovascular Disease

## 2016-07-16 ENCOUNTER — Other Ambulatory Visit: Payer: Self-pay | Admitting: Internal Medicine

## 2016-07-24 ENCOUNTER — Telehealth: Payer: Self-pay | Admitting: Internal Medicine

## 2016-07-24 MED ORDER — PREDNISONE 20 MG PO TABS: 20.0000 mg | ORAL_TABLET | Freq: Every day | ORAL | 0 refills | Status: DC

## 2016-07-24 MED ORDER — AZITHROMYCIN 250 MG PO TABS: ORAL_TABLET | ORAL | 0 refills | Status: AC

## 2016-07-24 NOTE — Telephone Encounter (Signed)
Pt aware of DK recommendations. RX sent to preferred pharmacy. Pt voiced understanding and had no further questions. Nothing further needed

## 2016-07-24 NOTE — Telephone Encounter (Signed)
Spoke with pt who states he feels that his bronchitis may be returning. Pt c/o occ prod cough white mucus, occ wheezing &  increased sob X3d Denies any fever, chills or sweats. Pt taking tessalon tid with slight improvement.   DK please advise. Thanks.

## 2016-07-24 NOTE — Telephone Encounter (Signed)
z pack and prednisone 20 mg daily for 7 days

## 2016-07-24 NOTE — Telephone Encounter (Signed)
Pt states his bronchitis has returned. Please call.

## 2016-08-03 ENCOUNTER — Other Ambulatory Visit: Payer: Self-pay | Admitting: Internal Medicine

## 2016-08-15 ENCOUNTER — Other Ambulatory Visit: Payer: Self-pay | Admitting: Cardiovascular Disease

## 2016-09-11 ENCOUNTER — Encounter: Payer: Self-pay | Admitting: Cardiovascular Disease

## 2016-09-11 ENCOUNTER — Telehealth: Payer: Self-pay | Admitting: Internal Medicine

## 2016-09-11 DIAGNOSIS — J449 Chronic obstructive pulmonary disease, unspecified: Secondary | ICD-10-CM

## 2016-09-11 NOTE — Telephone Encounter (Signed)
Called to request Trevor Ferguson put pt in airview so that I can get a download for pt's BiPAP. Once reeived I will print and forward to DK to advise.

## 2016-09-11 NOTE — Telephone Encounter (Signed)
Patient is requesting the home sleep study with home bipap machine. Please call patient.

## 2016-09-12 NOTE — Telephone Encounter (Signed)
Called to try and get a download for the pt's BiPAP and that is not available due to how old the machine is. Pt is requesting to have a sleep study with his BiPAP machine. Tried to call pt and LMOVM. Will await return call.

## 2016-09-13 NOTE — Telephone Encounter (Signed)
Spoke with pt and he is requesting to have an ONO on BiPAP done. States someone from [facility] informed him of this. Last OV was 04/2016. Please advise if you want to see pt first or if you want ONO ordered on BiPAP.

## 2016-09-14 NOTE — Telephone Encounter (Signed)
ono on bipap

## 2016-09-17 NOTE — Telephone Encounter (Signed)
Order has been place. Pt is aware and voiced his understanding. Nothing further needed.

## 2016-09-26 ENCOUNTER — Telehealth: Payer: Self-pay | Admitting: Internal Medicine

## 2016-09-26 NOTE — Telephone Encounter (Signed)
Pt is calling to let us know he would like to use [facility] so we may send prescribe for his Bpap machine  Please advise

## 2016-09-27 NOTE — Telephone Encounter (Signed)
Spoke with pt and he states he wants everything thru [facility]. Per [facility]/Jason pt has to do his ONO on BiPAP with the company he has machine through and can't change. Suanne Marker is calling to confirm where ONO order needs to go through.

## 2016-09-30 ENCOUNTER — Other Ambulatory Visit: Payer: Self-pay | Admitting: Internal Medicine

## 2016-09-30 NOTE — Telephone Encounter (Signed)
Please advise if we can refill Tessalon. Thanks.

## 2016-09-30 NOTE — Telephone Encounter (Signed)
yes

## 2016-09-30 NOTE — Telephone Encounter (Signed)
Tried to call pt to discuss what was sent back in regards to him switching to [facility]. Will await call.

## 2016-10-02 ENCOUNTER — Telehealth: Payer: Self-pay | Admitting: Internal Medicine

## 2016-10-02 NOTE — Telephone Encounter (Signed)
Spoke with pt and informed him that per [facility] that if pt has to compliant on BiPAP before they can do his ONO on BiPAP. Pt states he is NOT compliant. Now pt would like to know if he bought the oxygen concentrator out of pocket will [facility] give him service. Suanne Marker is sending message to [facility] to ask for pt and we will call him back.

## 2016-10-02 NOTE — Telephone Encounter (Signed)
will close this encounter due to another one already open for the same issue.

## 2016-10-02 NOTE — Telephone Encounter (Signed)
Pt would like a call regarding his "sleep study proposal"

## 2016-10-02 NOTE — Telephone Encounter (Signed)
Informed pt's wife that [facility] would take him as a self pay patient but that he needs to contact [facility] for cost. Pt and wife verbalized understanding and was given number to [facility]. Nothing further needed.

## 2016-12-25 ENCOUNTER — Other Ambulatory Visit: Payer: Self-pay | Admitting: Internal Medicine

## 2017-04-16 NOTE — Progress Notes (Addendum)
Cardiology Office Note  Date:  04/17/2017   ID:  Trevor Ferguson, DOB 1925-09-06, MRN [id number]  PCP:  Trevor Pitch, MD   Chief Complaint  Patient presents with  . other    12 month follow up. Patient deneis chest pain and SOB. Meds reviewed verbally with patient.     HPI:  Trevor Ferguson is a 83 yo pleasant white male with  Coronary artery disease, mild three-vessel seen on CT scan chest 2014 Mild COPD with chronic bronchitis with DOE, Smoked 45 years sleep apnea on BiPAP,  hypertension,  stroke,   chronic diastolic heart failure,  prior echocardiogram with mild to moderately elevated right heart pressures in August 2016, normal ejection fraction.  Long smoking history  carotid ultrasound with minimal carotid disease He presents today for routine follow-up of his shortness of breath  In follow-up he reports feeling well, no complaints Denies any chest pain on exertion concerning for angina Chronic mild shortness of breath, feels this is stable  CT chest 2014, images pulled up in the office today reviewed with him Mild to moderate CAD, 3 vessel,aorta atherosclerosis   sedentary Previously went to water aerobics Limited by his hip and back pain, chronic issue Unable to walk very far secondary to discomfort Weight is a chronic issue  Does not use hisBiPAP at nighttime, unable to tolerate well Reports that he does have periods of desaturation down to 81%  Continues to have swelling of his toes Not much benefit with Lasix  Previous Lab work total cholesterol 128, LDL 71, BUN 23, creatinine 1.3  EKG on today's visit shows normal sinus rhythm with rate 62 bpm, rare APC, no significant ST or T-wave changes   PMH:   has a past medical history of COPD (chronic obstructive pulmonary disease) ([place]); Hypertension; Pneumonia; Prostate cancer ([place]); Sleep apnea; and Stroke ([place]) (2012).  PSH:    Past Surgical History:  Procedure Laterality Date  . adenoid    .  APPENDECTOMY    . HEMORRHOID SURGERY    . spine cyst    . TONSILLECTOMY      Current Outpatient Prescriptions  Medication Sig Dispense Refill  . Albuterol Sulfate 108 (90 BASE) MCG/ACT AEPB Inhale into the lungs as needed.    . benzonatate (TESSALON) 100 MG capsule take 1 capsule by mouth three times a day if needed for cough 60 capsule 1  . BREO ELLIPTA 100-25 MCG/INH AEPB take 1 inhalation by mouth once daily 60 each 3  . Cholecalciferol (VITAMIN D-3 PO) Take 1,000 Int'l Units by mouth daily.    Marland Kitchen dipyridamole-aspirin (AGGRENOX) 200-25 MG per 12 hr capsule Take 1 capsule by mouth 2 (two) times daily.    Marland Kitchen escitalopram (LEXAPRO) 5 MG tablet Take 5 mg by mouth daily.    Marland Kitchen losartan-hydrochlorothiazide (HYZAAR) 100-25 MG per tablet Take 1 tablet by mouth daily.    . Multiple Vitamins-Minerals (CENTRAL-VITE PO) Take 1 tablet by mouth daily.    . Omega-3 Fatty Acids (FISH OIL) 1000 MG CAPS Take 1 capsule by mouth daily.    . potassium chloride (K-DUR) 10 MEQ tablet take 1 tablet by mouth once daily if needed 30 tablet 3  . predniSONE (DELTASONE) 20 MG tablet Take 1 tablet (20 mg total) by mouth daily with breakfast. 7 tablet 0   No current facility-administered medications for this visit.      Allergies:   Penicillins and Darvon [propoxyphene]   Social History:  The patient  reports that he quit  smoking about 31 years ago. His smoking use included Cigarettes. He quit after 40.00 years of use. He has never used smokeless tobacco. He reports that he drinks alcohol. He reports that he does not use drugs.   Family History:   family history includes Asthma in his sister; COPD in his sister; Congestive Heart Failure in his mother; Stroke in his maternal grandfather; Tuberculosis in his maternal grandmother.    Review of Systems: Review of Systems  Constitutional: Negative.   Respiratory: Positive for shortness of breath.   Cardiovascular: Negative.   Gastrointestinal: Negative.    Musculoskeletal: Negative.   Neurological: Negative.   Psychiatric/Behavioral: Negative.   All other systems reviewed and are negative.    PHYSICAL EXAM: VS:  BP 132/62 (BP Location: Left Arm, Patient Position: Sitting, Cuff Size: Normal)   Pulse 62   Ht 5\' 9"  (1.753 m)   Wt 202 lb 8 oz (91.9 kg)   BMI 29.90 kg/m  , BMI Body mass index is 29.9 kg/m. GEN: Well nourished, well developed, in no acute distress  HEENT: normal  Neck: no JVD, carotid bruits, or masses Cardiac: RRR; no murmurs, rubs, or gallops,no edema  Respiratory:  Moderately decreased breath sounds throughout,normal work of breathing GI: soft, nontender, nondistended, + BS MS: no deformity or atrophy  Skin: warm and dry, no rash Neuro:  Strength and sensation are intact Psych: euthymic mood, full affect    Recent Labs: No results found for requested labs within last 8760 hours.    Lipid Panel No results found for: CHOL, HDL, LDLCALC, TRIG    Wt Readings from Last 3 Encounters:  04/17/17 202 lb 8 oz (91.9 kg)  04/24/16 204 lb (92.5 kg)  01/23/16 205 lb (93 kg)       ASSESSMENT AND PLAN:  Chronic obstructive pulmonary disease, unspecified COPD type ([place]) - Smoked 45 years, chronic shortness of breath on exertion Previous screening CT scan 2014  Essential hypertension - Plan: EKG 12-Lead Blood pressure is well controlled on today's visit. No changes made to the medications.  Chronic diastolic CHF (congestive heart failure) (H[facility]) - Plan: EKG 12-Lead Appears euvolemic, currently not on Lasix Suggested he take potassium several times per week  SOB (shortness of breath) - Plan: EKG 12-Lead Mildly elevated right heart pressures on previous echocardiogram 2012 Stable shortness of breath, likely underlying COPD from 45 year smoking  Leg swelling - Plan: EKG 12-Lead No significant leg swelling on exam Recommended leg elevation when sitting  Sleep apnea, unspecified type - Plan: EKG 12-Lead Unable  to tolerate BiPAP  CAD:  seen on CT scan 2014 Denies any anginal symptoms. Recommended he call our office if he has any worsening shortness of breath or chest pain concerning for angina Previous cholesterol at reasonable level   Total encounter time more than 25 minutes  Greater than 50% was spent in counseling and coordination of care with the patient  Disposition:   F/U  12 months   Orders Placed This Encounter  Procedures  . EKG 12-Lead     Signed, Esmond Plants, M.D., Ph.D. 04/17/2017  [place], [place]

## 2017-04-17 ENCOUNTER — Ambulatory Visit (INDEPENDENT_AMBULATORY_CARE_PROVIDER_SITE_OTHER): Payer: Medicare Other | Admitting: Cardiovascular Disease

## 2017-04-17 ENCOUNTER — Encounter: Payer: Self-pay | Admitting: Cardiovascular Disease

## 2017-04-17 VITALS — BP 132/62 | HR 62 | Ht 69.0 in | Wt 202.5 lb

## 2017-04-17 DIAGNOSIS — I5032 Chronic diastolic (congestive) heart failure: Secondary | ICD-10-CM

## 2017-04-17 DIAGNOSIS — I10 Essential (primary) hypertension: Secondary | ICD-10-CM

## 2017-04-17 DIAGNOSIS — M7989 Other specified soft tissue disorders: Secondary | ICD-10-CM

## 2017-04-17 DIAGNOSIS — J449 Chronic obstructive pulmonary disease, unspecified: Secondary | ICD-10-CM

## 2017-04-17 DIAGNOSIS — I25118 Atherosclerotic heart disease of native coronary artery with other forms of angina pectoris: Secondary | ICD-10-CM | POA: Diagnosis not present

## 2017-04-17 DIAGNOSIS — I209 Angina pectoris, unspecified: Secondary | ICD-10-CM | POA: Diagnosis not present

## 2017-04-17 DIAGNOSIS — G4730 Sleep apnea, unspecified: Secondary | ICD-10-CM

## 2017-04-17 DIAGNOSIS — R0602 Shortness of breath: Secondary | ICD-10-CM

## 2017-04-17 NOTE — Patient Instructions (Signed)

## 2017-05-09 ENCOUNTER — Other Ambulatory Visit: Payer: Self-pay | Admitting: Internal Medicine

## 2017-06-10 DIAGNOSIS — M7540 Impingement syndrome of unspecified shoulder: Secondary | ICD-10-CM | POA: Insufficient documentation

## 2017-06-19 ENCOUNTER — Other Ambulatory Visit: Payer: Self-pay | Admitting: Orthopedic Surgery

## 2017-06-19 DIAGNOSIS — M7542 Impingement syndrome of left shoulder: Secondary | ICD-10-CM

## 2017-06-26 ENCOUNTER — Ambulatory Visit: Payer: Medicare Other

## 2017-07-01 ENCOUNTER — Ambulatory Visit: Payer: Medicare Other | Admitting: Internal Medicine

## 2017-07-14 ENCOUNTER — Ambulatory Visit (INDEPENDENT_AMBULATORY_CARE_PROVIDER_SITE_OTHER): Payer: Medicare Other | Admitting: Internal Medicine

## 2017-07-14 ENCOUNTER — Encounter: Payer: Self-pay | Admitting: Internal Medicine

## 2017-07-14 VITALS — BP 132/60 | HR 85 | Ht 69.0 in | Wt 196.0 lb

## 2017-07-14 DIAGNOSIS — J209 Acute bronchitis, unspecified: Secondary | ICD-10-CM | POA: Diagnosis not present

## 2017-07-14 DIAGNOSIS — G4733 Obstructive sleep apnea (adult) (pediatric): Secondary | ICD-10-CM

## 2017-07-14 DIAGNOSIS — J440 Chronic obstructive pulmonary disease with acute lower respiratory infection: Secondary | ICD-10-CM | POA: Diagnosis not present

## 2017-07-14 DIAGNOSIS — I25118 Atherosclerotic heart disease of native coronary artery with other forms of angina pectoris: Secondary | ICD-10-CM

## 2017-07-14 MED ORDER — GUAIFENESIN-CODEINE 100-10 MG/5ML PO SOLN: 5.0000 mL | ORAL | 0 refills | Status: DC | PRN

## 2017-07-14 NOTE — Patient Instructions (Signed)
Obtain split night sleep study Cough syrup as needed

## 2017-07-14 NOTE — Progress Notes (Signed)
Date: 07/14/2017,   MRN# [id number] Trevor Ferguson 1926-05-24 Code Status:  Hosp day:@LENGTHOFSTAYDAYS @ Referring MD: @ATDPROV @     PCP:      AdmissionWeight: 196 lb (88.9 kg)                 CurrentWeight: 196 lb (88.9 kg) Trevor Ferguson is a 83 y.o. old male seen in consultation for SOB with exertion.  PFT's: 11/1 Fev 1 2.13L 87%predicted Ratio 71% predicted DLCO 48% predicted TLC 4.9 6MWT WNL  CHIEF COMPLAINT:  Follow up SOB   HISTORY OF PRESENT ILLNESS  Patient with SOB controlled with BREO  03/2015 CXR shows flattened diaphragms reviewed ECHO shows grade 1 diastolic dysfunction Overnight pulse ox reveals desats 113 times, patient needs Oxygen at night however, patient is NON compliant with biPAP for underlying OSA, will need sleep study PFT's c/w mild obstructive airways disease Still with some cough and SOB, no signs of infection at this time Uses albuterol as needed Patient just finished prednisone therapy for acutue bronchitis and has persistent nagging cough  6MWT WNL Ratio 71%, Fev1 87%, FVC 84% DLCO 48%     Current Medication:  Current Outpatient Medications:  .  Albuterol Sulfate 108 (90 BASE) MCG/ACT AEPB, Inhale into the lungs as needed., Disp: , Rfl:  .  benzonatate (TESSALON) 100 MG capsule, take 1 capsule by mouth three times a day if needed for cough, Disp: 60 capsule, Rfl: 1 .  BREO ELLIPTA 100-25 MCG/INH AEPB, take 1 inhalation by mouth once daily, Disp: 60 each, Rfl: 1 .  CETIRIZINE HCL PO, Take 10 mg by mouth daily., Disp: , Rfl:  .  Cholecalciferol (VITAMIN D-3 PO), Take 1,000 Int'l Units by mouth daily., Disp: , Rfl:  .  dipyridamole-aspirin (AGGRENOX) 200-25 MG per 12 hr capsule, Take 1 capsule by mouth 2 (two) times daily., Disp: , Rfl:  .  escitalopram (LEXAPRO) 5 MG tablet, Take 5 mg by mouth daily., Disp: , Rfl:  .  losartan-hydrochlorothiazide (HYZAAR) 100-25 MG per tablet, Take 1 tablet by mouth daily., Disp: , Rfl:  .  Multiple  Vitamins-Minerals (CENTRAL-VITE PO), Take 1 tablet by mouth daily., Disp: , Rfl:  .  Omega-3 Fatty Acids (FISH OIL) 1000 MG CAPS, Take 1 capsule by mouth daily., Disp: , Rfl:  .  potassium chloride (K-DUR) 10 MEQ tablet, take 1 tablet by mouth once daily if needed, Disp: 30 tablet, Rfl: 3 .  predniSONE (DELTASONE) 20 MG tablet, Take 1 tablet (20 mg total) by mouth daily with breakfast., Disp: 7 tablet, Rfl: 0    ALLERGIES   Penicillins and Darvon [propoxyphene]     REVIEW OF SYSTEMS   Review of Systems  Constitutional: Negative for chills and fever.  Respiratory: Negative for cough, sputum production and shortness of breath.   Cardiovascular: Negative for chest pain and leg swelling.  Gastrointestinal: Negative.   Skin: Negative for rash.  All other systems reviewed and are negative.    VS: BP 132/60 (BP Location: Left Arm, Cuff Size: Normal)   Pulse 85   Ht 5\' 9"  (1.753 m)   Wt 196 lb (88.9 kg)   SpO2 91%   BMI 28.94 kg/m      PHYSICAL EXAM  Physical Exam  Constitutional: He appears well-developed and well-nourished.  Cardiovascular: Normal rate, regular rhythm and normal heart sounds.  No murmur heard. Pulmonary/Chest: No respiratory distress. He has no wheezes.  Abdominal: He exhibits no distension. There is no tenderness. There is no rebound.  Musculoskeletal: Normal range of motion. He exhibits no edema.  Psychiatric: He has a normal mood and affect.      ASSESSMENT/PLAN    83 yo pleasant white male with MILD COPD with acute on chronic Bronchitis with DOE associated with LE edema with OSA with chronic hypoxic resp failure  which could be related to Cor [place]/RT heart strain and Grade 1 diastolic dysfunction.  Patient will need split night sleep study and patient would like to use nasal mask and assess therapy and to assess the need for oxygen therapy  Patient is noncompliant with BIPAP and refuses to wear it, however, patient would like to assess with  nasal mask  1.albuterol as needed 2.continue BREO 3.needs oxygen as night-obtain split night sleep study   Follow up in 6 months     Patient is satisfied with Plan of action and management.    [name], M.D.  [name]  Medical Director [address] Director [facility] Cardio-Pulmonary Department

## 2017-07-23 ENCOUNTER — Other Ambulatory Visit: Payer: Self-pay | Admitting: Internal Medicine

## 2017-08-27 ENCOUNTER — Ambulatory Visit: Payer: PRIVATE HEALTH INSURANCE | Attending: Internal Medicine

## 2017-08-27 DIAGNOSIS — G4733 Obstructive sleep apnea (adult) (pediatric): Secondary | ICD-10-CM | POA: Diagnosis present

## 2017-08-27 DIAGNOSIS — G4761 Periodic limb movement disorder: Secondary | ICD-10-CM | POA: Diagnosis not present

## 2017-09-02 ENCOUNTER — Telehealth: Payer: Self-pay | Admitting: *Deleted

## 2017-09-02 DIAGNOSIS — G4733 Obstructive sleep apnea (adult) (pediatric): Secondary | ICD-10-CM

## 2017-09-02 NOTE — Telephone Encounter (Signed)
Positive sleep apnea: Needs titration study. Patient aware.

## 2017-09-04 ENCOUNTER — Ambulatory Visit: Payer: Medicare PPO | Attending: Internal Medicine

## 2017-09-04 DIAGNOSIS — G4761 Periodic limb movement disorder: Secondary | ICD-10-CM | POA: Insufficient documentation

## 2017-09-04 DIAGNOSIS — G4733 Obstructive sleep apnea (adult) (pediatric): Secondary | ICD-10-CM | POA: Diagnosis present

## 2017-09-05 DIAGNOSIS — G4733 Obstructive sleep apnea (adult) (pediatric): Secondary | ICD-10-CM | POA: Diagnosis not present

## 2017-09-06 DIAGNOSIS — G4733 Obstructive sleep apnea (adult) (pediatric): Secondary | ICD-10-CM | POA: Diagnosis not present

## 2017-09-08 ENCOUNTER — Telehealth: Payer: Self-pay | Admitting: *Deleted

## 2017-09-08 DIAGNOSIS — G4733 Obstructive sleep apnea (adult) (pediatric): Secondary | ICD-10-CM

## 2017-09-08 NOTE — Telephone Encounter (Signed)
Patient contacted with results of sleep study on Cell # 1st per chart. 3 orders entered and  (1) Auto cpap (2) mask fit per recommendation.  Pt verbalizes understanding and will call and schedule 6 week f/u after picking up cpap machine.

## 2017-09-30 ENCOUNTER — Ambulatory Visit (HOSPITAL_BASED_OUTPATIENT_CLINIC_OR_DEPARTMENT_OTHER): Payer: PRIVATE HEALTH INSURANCE | Attending: Internal Medicine | Admitting: Radiology

## 2017-09-30 DIAGNOSIS — G4733 Obstructive sleep apnea (adult) (pediatric): Secondary | ICD-10-CM

## 2018-01-16 DIAGNOSIS — J4489 Other specified chronic obstructive pulmonary disease: Secondary | ICD-10-CM | POA: Insufficient documentation

## 2018-01-16 DIAGNOSIS — J449 Chronic obstructive pulmonary disease, unspecified: Secondary | ICD-10-CM | POA: Insufficient documentation

## 2018-03-05 ENCOUNTER — Ambulatory Visit: Payer: PRIVATE HEALTH INSURANCE | Admitting: Internal Medicine

## 2018-03-06 ENCOUNTER — Encounter: Payer: Self-pay | Admitting: Internal Medicine

## 2018-05-14 ENCOUNTER — Other Ambulatory Visit: Payer: Self-pay

## 2018-05-14 MED ORDER — FLUTICASONE FUROATE-VILANTEROL 100-25 MCG/INH IN AEPB: INHALATION_SPRAY | RESPIRATORY_TRACT | 1 refills | Status: DC

## 2018-05-14 NOTE — Telephone Encounter (Signed)
Received refill request from [facility] via fax. Refill request approved and sent to [facility].

## 2018-05-26 ENCOUNTER — Ambulatory Visit: Payer: PRIVATE HEALTH INSURANCE | Admitting: Internal Medicine

## 2018-05-27 ENCOUNTER — Encounter: Payer: Self-pay | Admitting: Internal Medicine

## 2018-06-11 ENCOUNTER — Ambulatory Visit: Payer: PRIVATE HEALTH INSURANCE | Admitting: Internal Medicine

## 2018-06-26 ENCOUNTER — Telehealth: Payer: Self-pay | Admitting: Internal Medicine

## 2018-06-26 NOTE — Telephone Encounter (Signed)
Message left for Trevor Ferguson at [facility] in regards to cpap order that was faxed 08/2017.

## 2018-06-26 NOTE — Telephone Encounter (Signed)
Patient calling stating he's still not yet been contacted by [facility]   Would like a call back

## 2018-06-29 NOTE — Telephone Encounter (Signed)
Per [facility] patient was contacted twice this year and scheduled but no showed appts for set up. Called patient and notified that he would need to contact [place] to schedule set-up. Nothing further needed.

## 2018-07-17 ENCOUNTER — Other Ambulatory Visit: Payer: Self-pay

## 2018-07-17 ENCOUNTER — Emergency Department: Payer: Medicare PPO

## 2018-07-17 ENCOUNTER — Inpatient Hospital Stay
Admission: EM | Admit: 2018-07-17 | Discharge: 2018-07-19 | DRG: 190 | Disposition: A | Discharge status: Home Health | Payer: Medicare PPO | Attending: Specialist | Admitting: Specialist

## 2018-07-17 DIAGNOSIS — Z66 Do not resuscitate: Secondary | ICD-10-CM | POA: Diagnosis present

## 2018-07-17 DIAGNOSIS — Z23 Encounter for immunization: Secondary | ICD-10-CM

## 2018-07-17 DIAGNOSIS — R0602 Shortness of breath: Secondary | ICD-10-CM | POA: Diagnosis present

## 2018-07-17 DIAGNOSIS — E860 Dehydration: Secondary | ICD-10-CM | POA: Diagnosis present

## 2018-07-17 DIAGNOSIS — Z885 Allergy status to narcotic agent status: Secondary | ICD-10-CM

## 2018-07-17 DIAGNOSIS — Z831 Family history of other infectious and parasitic diseases: Secondary | ICD-10-CM

## 2018-07-17 DIAGNOSIS — Z923 Personal history of irradiation: Secondary | ICD-10-CM | POA: Diagnosis not present

## 2018-07-17 DIAGNOSIS — Z87891 Personal history of nicotine dependence: Secondary | ICD-10-CM | POA: Diagnosis not present

## 2018-07-17 DIAGNOSIS — G4733 Obstructive sleep apnea (adult) (pediatric): Secondary | ICD-10-CM | POA: Diagnosis present

## 2018-07-17 DIAGNOSIS — J441 Chronic obstructive pulmonary disease with (acute) exacerbation: Secondary | ICD-10-CM | POA: Diagnosis not present

## 2018-07-17 DIAGNOSIS — R112 Nausea with vomiting, unspecified: Secondary | ICD-10-CM

## 2018-07-17 DIAGNOSIS — Z9089 Acquired absence of other organs: Secondary | ICD-10-CM

## 2018-07-17 DIAGNOSIS — Z9049 Acquired absence of other specified parts of digestive tract: Secondary | ICD-10-CM

## 2018-07-17 DIAGNOSIS — Z79899 Other long term (current) drug therapy: Secondary | ICD-10-CM

## 2018-07-17 DIAGNOSIS — F329 Major depressive disorder, single episode, unspecified: Secondary | ICD-10-CM | POA: Diagnosis present

## 2018-07-17 DIAGNOSIS — Z823 Family history of stroke: Secondary | ICD-10-CM | POA: Diagnosis not present

## 2018-07-17 DIAGNOSIS — I110 Hypertensive heart disease with heart failure: Secondary | ICD-10-CM | POA: Diagnosis present

## 2018-07-17 DIAGNOSIS — Z825 Family history of asthma and other chronic lower respiratory diseases: Secondary | ICD-10-CM

## 2018-07-17 DIAGNOSIS — D649 Anemia, unspecified: Secondary | ICD-10-CM | POA: Diagnosis present

## 2018-07-17 DIAGNOSIS — Z8673 Personal history of transient ischemic attack (TIA), and cerebral infarction without residual deficits: Secondary | ICD-10-CM

## 2018-07-17 DIAGNOSIS — Z7952 Long term (current) use of systemic steroids: Secondary | ICD-10-CM

## 2018-07-17 DIAGNOSIS — J189 Pneumonia, unspecified organism: Secondary | ICD-10-CM | POA: Diagnosis present

## 2018-07-17 DIAGNOSIS — D696 Thrombocytopenia, unspecified: Secondary | ICD-10-CM | POA: Diagnosis present

## 2018-07-17 DIAGNOSIS — Z7902 Long term (current) use of antithrombotics/antiplatelets: Secondary | ICD-10-CM

## 2018-07-17 DIAGNOSIS — Z8701 Personal history of pneumonia (recurrent): Secondary | ICD-10-CM

## 2018-07-17 DIAGNOSIS — Z8546 Personal history of malignant neoplasm of prostate: Secondary | ICD-10-CM | POA: Diagnosis not present

## 2018-07-17 DIAGNOSIS — J9601 Acute respiratory failure with hypoxia: Secondary | ICD-10-CM | POA: Diagnosis present

## 2018-07-17 DIAGNOSIS — J440 Chronic obstructive pulmonary disease with acute lower respiratory infection: Secondary | ICD-10-CM | POA: Diagnosis present

## 2018-07-17 DIAGNOSIS — Z8249 Family history of ischemic heart disease and other diseases of the circulatory system: Secondary | ICD-10-CM | POA: Diagnosis not present

## 2018-07-17 DIAGNOSIS — I5032 Chronic diastolic (congestive) heart failure: Secondary | ICD-10-CM | POA: Diagnosis present

## 2018-07-17 DIAGNOSIS — Z880 Allergy status to penicillin: Secondary | ICD-10-CM

## 2018-07-17 LAB — COMPREHENSIVE METABOLIC PANEL
ALT: 30 U/L (ref 0–44)
AST: 35 U/L (ref 15–41)
Albumin: 3.4 g/dL — ABNORMAL LOW (ref 3.5–5.0)
Alkaline Phosphatase: 74 U/L (ref 38–126)
Anion gap: 9 (ref 5–15)
BILIRUBIN TOTAL: 1.3 mg/dL — AB (ref 0.3–1.2)
BUN: 23 mg/dL (ref 8–23)
CO2: 23 mmol/L (ref 22–32)
Calcium: 8.3 mg/dL — ABNORMAL LOW (ref 8.9–10.3)
Chloride: 103 mmol/L (ref 98–111)
Creatinine, Ser: 1.25 mg/dL — ABNORMAL HIGH (ref 0.61–1.24)
GFR calc non Af Amer: 50 mL/min — ABNORMAL LOW (ref >60)
GFR, EST AFRICAN AMERICAN: 58 mL/min — AB (ref >60)
Glucose, Bld: 149 mg/dL — ABNORMAL HIGH (ref 70–99)
Potassium: 3.8 mmol/L (ref 3.5–5.1)
Sodium: 135 mmol/L (ref 135–145)
TOTAL PROTEIN: 6.1 g/dL — AB (ref 6.5–8.1)

## 2018-07-17 LAB — CBC WITH DIFFERENTIAL/PLATELET
ABS IMMATURE GRANULOCYTES: 0.29 10*3/uL — AB (ref 0.00–0.07)
Basophils Absolute: 0.1 10*3/uL (ref 0.0–0.1)
Basophils Relative: 1 %
Eosinophils Absolute: 0 10*3/uL (ref 0.0–0.5)
Eosinophils Relative: 0 %
HCT: 30.1 % — ABNORMAL LOW (ref 39.0–52.0)
Hemoglobin: 10.6 g/dL — ABNORMAL LOW (ref 13.0–17.0)
Immature Granulocytes: 3 %
Lymphocytes Relative: 3 %
Lymphs Abs: 0.3 10*3/uL — ABNORMAL LOW (ref 0.7–4.0)
MCH: 32.7 pg (ref 26.0–34.0)
MCHC: 35.2 g/dL (ref 30.0–36.0)
MCV: 92.9 fL (ref 80.0–100.0)
MONO ABS: 1.5 10*3/uL — AB (ref 0.1–1.0)
Monocytes Relative: 14 %
NEUTROS ABS: 8.9 10*3/uL — AB (ref 1.7–7.7)
NEUTROS PCT: 79 %
NRBC: 0 % (ref 0.0–0.2)
PLATELETS: 107 10*3/uL — AB (ref 150–400)
RBC: 3.24 MIL/uL — ABNORMAL LOW (ref 4.22–5.81)
RDW: 12.9 % (ref 11.5–15.5)
WBC: 11.2 10*3/uL — AB (ref 4.0–10.5)

## 2018-07-17 LAB — LIPASE, BLOOD: Lipase: 29 U/L (ref 11–51)

## 2018-07-17 LAB — TROPONIN I

## 2018-07-17 LAB — MRSA PCR SCREENING: MRSA BY PCR: NEGATIVE

## 2018-07-17 MED ORDER — LOSARTAN POTASSIUM 25 MG PO TABS
25.0000 mg | ORAL_TABLET | Freq: Every day | ORAL | Status: DC
  Administered 2018-07-17 – 2018-07-19 (×3): 25 mg via ORAL
  Filled 2018-07-17 (×3): qty 1

## 2018-07-17 MED ORDER — ALBUTEROL SULFATE (2.5 MG/3ML) 0.083% IN NEBU: 2.5000 mg | INHALATION_SOLUTION | RESPIRATORY_TRACT | Status: DC | PRN

## 2018-07-17 MED ORDER — SENNOSIDES-DOCUSATE SODIUM 8.6-50 MG PO TABS: 1.0000 | ORAL_TABLET | Freq: Every evening | ORAL | Status: DC | PRN

## 2018-07-17 MED ORDER — SODIUM CHLORIDE 0.9 % IV SOLN: 2.0000 g | Freq: Three times a day (TID) | INTRAVENOUS | Status: DC

## 2018-07-17 MED ORDER — PREDNISONE 20 MG PO TABS: 20.0000 mg | ORAL_TABLET | Freq: Every day | ORAL | Status: DC

## 2018-07-17 MED ORDER — LORATADINE 10 MG PO TABS
10.0000 mg | ORAL_TABLET | Freq: Every day | ORAL | Status: DC
  Filled 2018-07-17 (×2): qty 1

## 2018-07-17 MED ORDER — VANCOMYCIN HCL IN DEXTROSE 1-5 GM/200ML-% IV SOLN
1000.0000 mg | Freq: Once | INTRAVENOUS | Status: DC
  Filled 2018-07-17: qty 200

## 2018-07-17 MED ORDER — FLUTICASONE FUROATE-VILANTEROL 100-25 MCG/INH IN AEPB
1.0000 | INHALATION_SPRAY | Freq: Every day | RESPIRATORY_TRACT | Status: DC
  Filled 2018-07-17: qty 28

## 2018-07-17 MED ORDER — VITAMIN D 25 MCG (1000 UNIT) PO TABS
1000.0000 [IU] | ORAL_TABLET | Freq: Every day | ORAL | Status: DC
  Administered 2018-07-18 – 2018-07-19 (×2): 1000 [IU] via ORAL
  Filled 2018-07-17 (×2): qty 1

## 2018-07-17 MED ORDER — FUROSEMIDE 10 MG/ML IJ SOLN
60.0000 mg | Freq: Once | INTRAMUSCULAR | Status: AC
  Administered 2018-07-17: 60 mg via INTRAVENOUS
  Filled 2018-07-17: qty 8

## 2018-07-17 MED ORDER — SODIUM CHLORIDE 0.9 % IV SOLN
2.0000 g | Freq: Three times a day (TID) | INTRAVENOUS | Status: DC
  Administered 2018-07-17 – 2018-07-18 (×2): 2 g via INTRAVENOUS
  Filled 2018-07-17 (×5): qty 2

## 2018-07-17 MED ORDER — ASPIRIN-DIPYRIDAMOLE ER 25-200 MG PO CP12
1.0000 | ORAL_CAPSULE | Freq: Two times a day (BID) | ORAL | Status: DC
  Administered 2018-07-17 – 2018-07-19 (×4): 1 via ORAL
  Filled 2018-07-17 (×5): qty 1

## 2018-07-17 MED ORDER — BISACODYL 5 MG PO TBEC: 5.0000 mg | DELAYED_RELEASE_TABLET | Freq: Every day | ORAL | Status: DC | PRN

## 2018-07-17 MED ORDER — ESCITALOPRAM OXALATE 10 MG PO TABS
5.0000 mg | ORAL_TABLET | Freq: Every day | ORAL | Status: DC
  Administered 2018-07-18 – 2018-07-19 (×2): 5 mg via ORAL
  Filled 2018-07-17 (×2): qty 0.5

## 2018-07-17 MED ORDER — ENOXAPARIN SODIUM 40 MG/0.4ML ~~LOC~~ SOLN: 40.0000 mg | SUBCUTANEOUS | Status: DC

## 2018-07-17 MED ORDER — SODIUM CHLORIDE 0.9% FLUSH
3.0000 mL | Freq: Two times a day (BID) | INTRAVENOUS | Status: DC
  Administered 2018-07-17 – 2018-07-18 (×2): 3 mL via INTRAVENOUS

## 2018-07-17 MED ORDER — SODIUM CHLORIDE 0.9 % IV SOLN: 250.0000 mL | INTRAVENOUS | Status: DC | PRN

## 2018-07-17 MED ORDER — ONDANSETRON HCL 4 MG PO TABS
4.0000 mg | ORAL_TABLET | Freq: Four times a day (QID) | ORAL | Status: DC | PRN
  Filled 2018-07-17: qty 1

## 2018-07-17 MED ORDER — LEVOFLOXACIN IN D5W 750 MG/150ML IV SOLN
750.0000 mg | Freq: Once | INTRAVENOUS | Status: AC
  Administered 2018-07-17: 750 mg via INTRAVENOUS
  Filled 2018-07-17: qty 150

## 2018-07-17 MED ORDER — GUAIFENESIN 100 MG/5ML PO SOLN
5.0000 mL | ORAL | Status: DC | PRN
  Administered 2018-07-17 – 2018-07-19 (×2): 100 mg via ORAL
  Filled 2018-07-17 (×3): qty 5

## 2018-07-17 MED ORDER — ACETAMINOPHEN 650 MG RE SUPP: 650.0000 mg | Freq: Four times a day (QID) | RECTAL | Status: DC | PRN

## 2018-07-17 MED ORDER — HYDROCODONE-ACETAMINOPHEN 5-325 MG PO TABS: 1.0000 | ORAL_TABLET | ORAL | Status: DC | PRN

## 2018-07-17 MED ORDER — VANCOMYCIN HCL 10 G IV SOLR
1500.0000 mg | INTRAVENOUS | Status: DC
  Administered 2018-07-17 – 2018-07-18 (×2): 1500 mg via INTRAVENOUS
  Filled 2018-07-17 (×2): qty 1500

## 2018-07-17 MED ORDER — SODIUM CHLORIDE 0.9% FLUSH: 3.0000 mL | INTRAVENOUS | Status: DC | PRN

## 2018-07-17 MED ORDER — ACETAMINOPHEN 325 MG PO TABS: 650.0000 mg | ORAL_TABLET | Freq: Four times a day (QID) | ORAL | Status: DC | PRN

## 2018-07-17 MED ORDER — INFLUENZA VAC SPLIT HIGH-DOSE 0.5 ML IM SUSY
0.5000 mL | PREFILLED_SYRINGE | INTRAMUSCULAR | Status: AC
  Administered 2018-07-19: 0.5 mL via INTRAMUSCULAR
  Filled 2018-07-17: qty 0.5

## 2018-07-17 MED ORDER — BENZONATATE 100 MG PO CAPS
100.0000 mg | ORAL_CAPSULE | Freq: Three times a day (TID) | ORAL | Status: DC | PRN
  Administered 2018-07-18 – 2018-07-19 (×3): 100 mg via ORAL
  Filled 2018-07-17 (×3): qty 1

## 2018-07-17 MED ORDER — ONDANSETRON HCL 4 MG/2ML IJ SOLN: 4.0000 mg | Freq: Four times a day (QID) | INTRAMUSCULAR | Status: DC | PRN

## 2018-07-17 NOTE — ED Notes (Signed)
EKG given to [place] in person.

## 2018-07-17 NOTE — ED Triage Notes (Signed)
Pt brought in via EMS from [facility] with n/v since last night. Pt placed on 4L O2 by EMS to maintain 94%. 87% on RA per EMS. Vomit is clear per pt. BP 170/60 per EMS.

## 2018-07-17 NOTE — H&P (Addendum)
[facility] [place] NAME: Trevor Ferguson    MR#:  [id number]  DATE OF BIRTH:  [birth year]  DATE OF ADMISSION:  07/17/2018  PRIMARY CARE PHYSICIAN: Juluis Pitch, MD   REQUESTING/REFERRING PHYSICIAN: Dr. Archie Balboa.  CHIEF COMPLAINT:   Chief Complaint  Patient presents with  . Emesis  . Shortness of Breath   Cough with sputum and shortness of breath for 3 days. HISTORY OF PRESENT ILLNESS:  Trevor Ferguson  is a 84 y.o. male with a known history of COPD, hypertension, pneumonia, prostate cancer, OSA, stroke.  The patient is sent from nursing home to ED due to above chief complaints.  The patient also has some fever and nausea.  He denies any chest pain, palpitation, wheezing, orthopnea or nocturnal dyspnea or leg edema.  He is hypoxia and put on oxygen 4 L by nasal counter.  Chest x-ray show pneumonia.  The patient was treated with vancomycin, Levaquin and Azactam in the ED.  PAST MEDICAL HISTORY:   Past Medical History:  Diagnosis Date  . COPD (chronic obstructive pulmonary disease) ([place])   . Hypertension   . Pneumonia    [id number] hospitalized  . Prostate cancer ([place])    with seed placement  . Sleep apnea    noncompliant, dx 1996  . Stroke [facility]) 2012   no residuals    PAST SURGICAL HISTORY:   Past Surgical History:  Procedure Laterality Date  . adenoid    . APPENDECTOMY    . HEMORRHOID SURGERY    . spine cyst    . TONSILLECTOMY      SOCIAL HISTORY:   Social History   Tobacco Use  . Smoking status: Former Smoker    Years: 40.00    Types: Cigarettes    Last attempt to quit: 08/19/1985    Years since quitting: 32.9  . Smokeless tobacco: Never Used  Substance Use Topics  . Alcohol use: Yes    FAMILY HISTORY:   Family History  Problem Relation Age of Onset  . Congestive Heart Failure Mother   . COPD Sister   . Asthma Sister   . Tuberculosis Maternal Grandmother   . Stroke Maternal Grandfather     DRUG  ALLERGIES:   Allergies  Allergen Reactions  . Penicillins Rash, Other (See Comments) and Hives    Peeling around ankles, ?cough  . Darvon [Propoxyphene] Nausea Only and Rash    REVIEW OF SYSTEMS:   Review of Systems  Constitutional: Positive for malaise/fatigue. Negative for chills and fever.  HENT: Negative for sore throat.   Eyes: Negative for blurred vision and double vision.  Respiratory: Positive for cough, sputum production, shortness of breath and wheezing. Negative for hemoptysis and stridor.   Cardiovascular: Negative for chest pain, palpitations, orthopnea and leg swelling.  Gastrointestinal: Positive for nausea. Negative for abdominal pain, blood in stool, diarrhea, melena and vomiting.  Genitourinary: Negative for dysuria, flank pain and hematuria.  Musculoskeletal: Negative for back pain and joint pain.  Skin: Negative for rash.  Neurological: Negative for dizziness, sensory change, focal weakness, seizures, loss of consciousness, weakness and headaches.  Endo/Heme/Allergies: Negative for polydipsia.  Psychiatric/Behavioral: Negative for depression. The patient is not nervous/anxious.     MEDICATIONS AT HOME:   Prior to Admission medications   Medication Sig Start Date End Date Taking? Authorizing Provider  Albuterol Sulfate 108 (90 BASE) MCG/ACT AEPB Inhale 1-2 puffs into the lungs every 6 (six) hours as needed (shortness of breath /  wheezing).    Yes [provider]  cetirizine (ZYRTEC) 10 MG tablet Take 10 mg by mouth daily.   Yes [provider]  Cholecalciferol (VITAMIN D-3) 25 MCG (1000 UT) CAPS Take 1,000 Units by mouth daily.    Yes [provider]  dipyridamole-aspirin (AGGRENOX) 200-25 MG per 12 hr capsule Take 1 capsule by mouth 2 (two) times daily.   Yes [provider]  escitalopram (LEXAPRO) 5 MG tablet Take 5 mg by mouth daily.   Yes [provider]  fluticasone furoate-vilanterol (BREO ELLIPTA) 100-25 MCG/INH  AEPB Inhale 1 puff into the lungs daily. 07/23/17  Yes Kasa, Maretta Bees, MD  losartan-hydrochlorothiazide (HYZAAR) 100-25 MG per tablet Take 1 tablet by mouth daily.   Yes [provider]  Multiple Vitamins-Minerals (CENTRAL-VITE PO) Take 1 tablet by mouth daily.   Yes [provider]  benzonatate (TESSALON) 100 MG capsule take 1 capsule by mouth three times a day if needed for cough 09/30/16   Flora Lipps, MD  fluticasone furoate-vilanterol (BREO ELLIPTA) 100-25 MCG/INH AEPB take 1 inhalation by mouth once daily 05/14/18   Flora Lipps, MD  guaiFENesin-codeine 100-10 MG/5ML syrup Take 5 mLs by mouth every 4 (four) hours as needed for cough. 07/14/17   Flora Lipps, MD  potassium chloride (K-DUR) 10 MEQ tablet take 1 tablet by mouth once daily if needed 07/08/16   Minna Merritts, MD  predniSONE (DELTASONE) 20 MG tablet Take 1 tablet (20 mg total) by mouth daily with breakfast. 07/24/16   Flora Lipps, MD      VITAL SIGNS:  Blood pressure 132/85, pulse 82, resp. rate (!) 21, height 5\' 9"  (1.753 m), weight 95.3 kg, SpO2 95 %.  PHYSICAL EXAMINATION:  Physical Exam  GENERAL:  84 y.o.-year-old patient lying in the bed with no acute distress.  EYES: Pupils equal, round, reactive to light and accommodation. No scleral icterus. Extraocular muscles intact.  HEENT: Head atraumatic, normocephalic. Oropharynx and nasopharynx clear.  NECK:  Supple, no jugular venous distention. No thyroid enlargement, no tenderness.  LUNGS: Normal breath sounds bilaterally, no wheezing, bilateral basilar crackles. No use of accessory muscles of respiration.  CARDIOVASCULAR: S1, S2 normal. No murmurs, rubs, or gallops.  ABDOMEN: Soft, nontender, nondistended. Bowel sounds present. No organomegaly or mass.  EXTREMITIES: No pedal edema, cyanosis, or clubbing.  NEUROLOGIC: Cranial nerves II through XII are intact. Muscle strength 5/5 in all extremities. Sensation intact. Gait not checked.  PSYCHIATRIC: The  patient is alert and oriented x 3.  SKIN: No obvious rash, lesion, or ulcer.   LABORATORY PANEL:   CBC Recent Labs  Lab 07/17/18 1723  WBC 11.2*  HGB 10.6*  HCT 30.1*  PLT 107*   ------------------------------------------------------------------------------------------------------------------  Chemistries  Recent Labs  Lab 07/17/18 1723  NA 135  K 3.8  CL 103  CO2 23  GLUCOSE 149*  BUN 23  CREATININE 1.25*  CALCIUM 8.3*  AST 35  ALT 30  ALKPHOS 74  BILITOT 1.3*   ------------------------------------------------------------------------------------------------------------------  Cardiac Enzymes Recent Labs  Lab 07/17/18 1723  TROPONINI <0.03   ------------------------------------------------------------------------------------------------------------------  RADIOLOGY:  Dg Chest 2 View  Result Date: 07/17/2018 CLINICAL DATA:  Cough and short of breath 3 days EXAM: CHEST - 2 VIEW COMPARISON:  03/28/2015 FINDINGS: Diffuse patchy airspace disease bilaterally most prominent in the bases. Heart size within normal limits. Pulmonary hyperinflation. Small bilateral effusions IMPRESSION: COPD Diffuse bilateral airspace disease and small effusions. Probable heart failure although pneumonia is possible. Electronically Signed   By: Juanda Crumble  Carlis Abbott M.D.   On: 07/17/2018 18:01      IMPRESSION AND PLAN:   Acute respiratory failure with hypoxia due to HAP pneumonia. The patient will be admitted to medical floor. Continue oxygen by nasal cannula. Continue vancomycin and Azactam, follow-up cultures.  Nebulizer PRN, Robitussin as needed.  Mild dehydration.  Encourage oral intake and hold HCTZ.  COPD.  Stable.  Continue home prednisone, nebulizer PRN.  Hypertension.  Continue losartan, hold HCTZ.  HCTZ.  Anemia.  Unclear etiology.  Follow-up CBC.  All the records are reviewed and case discussed with ED provider. Management plans discussed with the patient, his daughter and  they are in agreement.  CODE STATUS: DNR  TOTAL TIME TAKING CARE OF THIS PATIENT: 35 minutes.    Demetrios Loll M.D on 07/17/2018 at 7:20 PM  Between 7am to 6pm - Pager - [phone]  After 6pm go to [email or web address] - Proofreader  [facility]  Office  [phone]  CC: Primary care physician; Juluis Pitch, MD   Note: This dictation was prepared with Dragon dictation along with smaller phrase technology. Any transcriptional errors that result from this process are unin

## 2018-07-17 NOTE — Consult Note (Signed)
Pharmacy Antibiotic Note  Trevor Ferguson is a 84 y.o. male admitted on 07/17/2018 with pneumonia.  Pharmacy has been consulted for vancomycin dosing. SCr is 1.25 but this appears to be his baseline. There is no recent h/o vancomycin dosing here at [facility] to guide dosing.   Plan: Vancomycin 1500mg  IV every 24 hours beginning 6 hours after first 1000mg  dose in the ED.    Ke: 0.042  Vd: 67L  T1/2: 17h  Calculated concentrations at steady-state: 42.3/16.1 mcg/mL  VT prior to 4th dose  Goal trough 15-20 mcg/mL.  Height: 5\' 9"  (175.3 cm) Weight: 210 lb (95.3 kg) IBW/kg (Calculated) : 70.7  No data recorded.  Recent Labs  Lab 07/17/18 1723  WBC 11.2*  CREATININE 1.25*    Estimated Creatinine Clearance: 44.7 mL/min (A) (by C-G formula based on SCr of 1.25 mg/dL (H)).    Allergies  Allergen Reactions  . Penicillins Rash, Other (See Comments) and Hives    Peeling around ankles, ?cough  . Darvon [Propoxyphene] Nausea Only and Rash    Antimicrobials this admission: vancomycin 11/29 >>  Aztreonam 11/29 >> ---PCN allergy w/ hives as reaction and limited dosing hx levofloxacin 11/29 >>   Microbiology results: 11/29 BCx: pending 11/29 UCx: pending  11/30 MRSA PCR: pending  Thank you for allowing pharmacy to be a part of this patient's care.  Dallie Piles, PharmD 07/17/2018 7:26 PM

## 2018-07-17 NOTE — Progress Notes (Signed)
Advanced Care Plan.  Purpose of Encounter: CODE STATUS. Parties in Attendance: The patient, his daughter and me. Patient's Decisional Capacity: Yes. Medical Story: Trevor Ferguson  is a 84 y.o. male with a known history of COPD, hypertension, pneumonia, prostate cancer, OSA, stroke.  The patient is being admitted for acute respiratory failure with hypoxia due to HAP pneumonia.  I discussed with patient about his current condition, prognosis and CODE STATUS.  The patient does not want to be resuscitated or intubated if he has cardiopulmonary arrest.  This is confirmed with his daughter. Plan:  Code Status: DNR. Time spent discussing advance care planning: 17 minutes.

## 2018-07-17 NOTE — ED Provider Notes (Signed)
[facility] Emergency Department Provider Note   ____________________________________________   I have reviewed the triage vital signs and the nursing notes.   HISTORY  Chief Complaint Emesis and Shortness of Breath   History limited by: Not Limited   HPI Trevor Ferguson is a 84 y.o. male who presents to the emergency department today because of concerns for shortness of breath as well as nausea.  Patient states that he has a somewhat chronic cough.  Over the past few days that cough is been worsening shortness of breath has been worse.  He does find a lot of shortness of breath on exertion.  He has a cough productive of thick phlegm and now.  He denies any chest pain.  He denies any fevers. He states that today he also developed nausea. Did not have any actual episodes of vomiting. He denies any recent change in bowel movements.   Per medical record review patient has a history of COPD, HTN, pneumonia.   Past Medical History:  Diagnosis Date  . COPD (chronic obstructive pulmonary disease) ([place])   . Hypertension   . Pneumonia    [id number] hospitalized  . Prostate cancer ([place])    with seed placement  . Sleep apnea    noncompliant, dx 1996  . Stroke [facility]) 2012   no residuals    Patient Active Problem List   Diagnosis Date Noted  . Leg swelling 12/18/2015  . Chronic diastolic CHF (congestive heart failure) ([place]) 07/18/2015  . Essential hypertension 07/18/2015  . COPD (chronic obstructive pulmonary disease) ([place]) 03/28/2015  . Sleep apnea 03/28/2015  . SOB (shortness of breath) 03/28/2015    Past Surgical History:  Procedure Laterality Date  . adenoid    . APPENDECTOMY    . HEMORRHOID SURGERY    . spine cyst    . TONSILLECTOMY      Prior to Admission medications   Medication Sig Start Date End Date Taking? Authorizing Provider  Albuterol Sulfate 108 (90 BASE) MCG/ACT AEPB Inhale into the lungs as needed.    [provider]   benzonatate (TESSALON) 100 MG capsule take 1 capsule by mouth three times a day if needed for cough 09/30/16   Flora Lipps, MD  CETIRIZINE HCL PO Take 10 mg by mouth daily. 07/04/17   [provider]  Cholecalciferol (VITAMIN D-3 PO) Take 1,000 Int'l Units by mouth daily.    [provider]  dipyridamole-aspirin (AGGRENOX) 200-25 MG per 12 hr capsule Take 1 capsule by mouth 2 (two) times daily.    [provider]  escitalopram (LEXAPRO) 5 MG tablet Take 5 mg by mouth daily.    [provider]  fluticasone furoate-vilanterol (BREO ELLIPTA) 100-25 MCG/INH AEPB Inhale 1 puff into the lungs daily. 07/23/17   Flora Lipps, MD  fluticasone furoate-vilanterol (BREO ELLIPTA) 100-25 MCG/INH AEPB take 1 inhalation by mouth once daily 05/14/18   Flora Lipps, MD  guaiFENesin-codeine 100-10 MG/5ML syrup Take 5 mLs by mouth every 4 (four) hours as needed for cough. 07/14/17   Flora Lipps, MD  losartan-hydrochlorothiazide (HYZAAR) 100-25 MG per tablet Take 1 tablet by mouth daily.    [provider]  Multiple Vitamins-Minerals (CENTRAL-VITE PO) Take 1 tablet by mouth daily.    [provider]  Omega-3 Fatty Acids (FISH OIL) 1000 MG CAPS Take 1 capsule by mouth daily.    [provider]  potassium chloride (K-DUR) 10 MEQ tablet take 1 tablet by mouth once daily if needed 07/08/16  Minna Merritts, MD  predniSONE (DELTASONE) 20 MG tablet Take 1 tablet (20 mg total) by mouth daily with breakfast. 07/24/16   Flora Lipps, MD    Allergies Penicillins and Darvon [propoxyphene]  Family History  Problem Relation Age of Onset  . Congestive Heart Failure Mother   . COPD Sister   . Asthma Sister   . Tuberculosis Maternal Grandmother   . Stroke Maternal Grandfather     Social History Social History   Tobacco Use  . Smoking status: Former Smoker    Years: 40.00    Types: Cigarettes    Last attempt to quit: 08/19/1985    Years since quitting:  32.9  . Smokeless tobacco: Never Used  Substance Use Topics  . Alcohol use: Yes  . Drug use: No    Review of Systems Constitutional: No fever/chills Eyes: No visual changes. ENT: No sore throat. Cardiovascular: Denies chest pain. Respiratory: Positive for shortness of breath and cough. Gastrointestinal: No abdominal pain. Positive for nausea.  Genitourinary: Negative for dysuria. Musculoskeletal: Negative for back pain. Skin: Negative for rash. Neurological: Negative for headaches, focal weakness or numbness.  ____________________________________________   PHYSICAL EXAM:  VITAL SIGNS: ED Triage Vitals  Enc Vitals Group     BP 07/17/18 1619 (!) 159/75     Pulse Rate 07/17/18 1621 96     Resp 07/17/18 1629 (!) 25     Temp --      Temp src --      SpO2 07/17/18 1621 95 %     Weight 07/17/18 1620 210 lb (95.3 kg)     Height 07/17/18 1620 5\' 9"  (1.753 m)     Head Circumference --      Peak Flow --      Pain Score 07/17/18 1620 0   Constitutional: Alert and oriented.  Eyes: Conjunctivae are normal.  ENT      Head: Normocephalic and atraumatic.      Nose: No congestion/rhinnorhea.      Mouth/Throat: Mucous membranes are moist.      Neck: No stridor. Hematological/Lymphatic/Immunilogical: No cervical lymphadenopathy. Cardiovascular: Normal rate, regular rhythm.  No murmurs, rubs, or gallops.  Respiratory: Normal respiratory effort without tachypnea nor retractions. Breath sounds are clear and equal bilaterally. No wheezes/rales/rhonchi. Gastrointestinal: Soft and non tender. No rebound. No guarding.  Genitourinary: Deferred Musculoskeletal: Normal range of motion in all extremities. No lower extremity edema. Neurologic:  Normal speech and language. No gross focal neurologic deficits are appreciated.  Skin:  Skin is warm, dry and intact. No rash noted. Psychiatric: Mood and affect are normal. Speech and behavior are normal. Patient exhibits appropriate insight and  judgment.  ____________________________________________    LABS (pertinent positives/negatives)  Trop <0.03 Lipase 29 CMP na 135, k 3.8, glu 149, cr 1.25 CBC wbc 11.2, hgb 10.6, plt 107  ____________________________________________   EKG  I, Nance Pear, attending physician, personally viewed and interpreted this EKG  EKG Time: 1719 Rate: 88 Rhythm: normal sinus rhythm Axis: normal Intervals: qtc 453 QRS: narrow, low voltage ST changes: no st elevation Impression: abnormal ekg   ____________________________________________    RADIOLOGY  CXR Concern for pneumonia/edema ____________________________________________   PROCEDURES  Procedures  ____________________________________________   INITIAL IMPRESSION / ASSESSMENT AND PLAN / ED COURSE  Pertinent labs & imaging results that were available during my care of the patient were reviewed by me and considered in my medical decision making (see chart for details).   Patient presented to the emergency department today because  of concerns for some shortness of breath as well as some nausea and emesis.  Differential would be broad including pneumonia, pneumothorax, pleural effusions, pulmonary edema, ACS, dissection, pancreatitis amongst other etiologies.  Patient's x-ray is concerning for possible pneumonia and edema.  Discussed this finding with patient.  Will plan on starting IV antibiotics and admission.   ____________________________________________   FINAL CLINICAL IMPRESSION(S) / ED DIAGNOSES  Final diagnoses:  Nausea and vomiting, intractability of vomiting not specified, unspecified vomiting type  Shortness of breath  Pneumonia due to infectious organism, unspecified laterality, unspecified part of lung     Note: This dictation was prepared with Dragon dictation. Any transcriptional errors that result from this process are unintentional     Nance Pear, MD 07/17/18 1845

## 2018-07-17 NOTE — ED Notes (Signed)
Family at bedside. 

## 2018-07-18 LAB — BASIC METABOLIC PANEL
Anion gap: 9 (ref 5–15)
BUN: 26 mg/dL — ABNORMAL HIGH (ref 8–23)
CO2: 24 mmol/L (ref 22–32)
Calcium: 8.2 mg/dL — ABNORMAL LOW (ref 8.9–10.3)
Chloride: 104 mmol/L (ref 98–111)
Creatinine, Ser: 1.33 mg/dL — ABNORMAL HIGH (ref 0.61–1.24)
GFR calc Af Amer: 54 mL/min — ABNORMAL LOW (ref >60)
GFR calc non Af Amer: 47 mL/min — ABNORMAL LOW (ref >60)
Glucose, Bld: 136 mg/dL — ABNORMAL HIGH (ref 70–99)
POTASSIUM: 3.6 mmol/L (ref 3.5–5.1)
Sodium: 137 mmol/L (ref 135–145)

## 2018-07-18 LAB — CBC
HEMATOCRIT: 29.2 % — AB (ref 39.0–52.0)
HEMOGLOBIN: 10.2 g/dL — AB (ref 13.0–17.0)
MCH: 32.4 pg (ref 26.0–34.0)
MCHC: 34.9 g/dL (ref 30.0–36.0)
MCV: 92.7 fL (ref 80.0–100.0)
Platelets: 94 10*3/uL — ABNORMAL LOW (ref 150–400)
RBC: 3.15 MIL/uL — ABNORMAL LOW (ref 4.22–5.81)
RDW: 13 % (ref 11.5–15.5)
WBC: 10.6 10*3/uL — ABNORMAL HIGH (ref 4.0–10.5)
nRBC: 0 % (ref 0.0–0.2)

## 2018-07-18 LAB — STREP PNEUMONIAE URINARY ANTIGEN: Strep Pneumo Urinary Antigen: NEGATIVE

## 2018-07-18 MED ORDER — DIPHENHYDRAMINE HCL 25 MG PO CAPS: 25.0000 mg | ORAL_CAPSULE | Freq: Every evening | ORAL | Status: DC | PRN

## 2018-07-18 MED ORDER — LEVOFLOXACIN IN D5W 750 MG/150ML IV SOLN
750.0000 mg | INTRAVENOUS | Status: DC
  Filled 2018-07-18: qty 150

## 2018-07-18 MED ORDER — BUDESONIDE 0.5 MG/2ML IN SUSP
0.5000 mg | Freq: Two times a day (BID) | RESPIRATORY_TRACT | Status: DC
  Administered 2018-07-18 – 2018-07-19 (×2): 0.5 mg via RESPIRATORY_TRACT
  Filled 2018-07-18 (×2): qty 2

## 2018-07-18 MED ORDER — RAMELTEON 8 MG PO TABS
8.0000 mg | ORAL_TABLET | Freq: Every day | ORAL | Status: DC
  Administered 2018-07-18 (×2): 8 mg via ORAL
  Filled 2018-07-18 (×3): qty 1

## 2018-07-18 MED ORDER — IPRATROPIUM-ALBUTEROL 0.5-2.5 (3) MG/3ML IN SOLN
3.0000 mL | Freq: Four times a day (QID) | RESPIRATORY_TRACT | Status: DC
  Administered 2018-07-18 – 2018-07-19 (×4): 3 mL via RESPIRATORY_TRACT
  Filled 2018-07-18 (×4): qty 3

## 2018-07-18 MED ORDER — PREDNISONE 50 MG PO TABS
50.0000 mg | ORAL_TABLET | Freq: Every day | ORAL | Status: DC
  Administered 2018-07-18 – 2018-07-19 (×2): 50 mg via ORAL
  Filled 2018-07-18 (×2): qty 1

## 2018-07-18 NOTE — Progress Notes (Signed)
SATURATION QUALIFICATIONS: (This note is used to comply with regulatory documentation for home oxygen)  Patient Saturations on 3 liters of oxygen while Ambulating = 86-90%  Please briefly explain why patient needs home oxygen:   ambulated with patient x1 around nurses station. patient became short of breath with 2 min recovery rest time. patient desat to 86% on 3L while ambulating.    Madlyn Frankel, RN

## 2018-07-18 NOTE — Progress Notes (Signed)
Levofloxacin 750 mg IV Q24H modified to 750 mg IV Q48H due to creatinine clearance 20 to 49 mL/min.  Kamorah Nevils A. Silt, [place].D., BCPS Clinical Pharmacist 07/18/2018 09:21

## 2018-07-18 NOTE — Progress Notes (Signed)
[place] at [place] NAME: Trevor Ferguson    MR#:  [id number]  DATE OF BIRTH:  03/22/1926  SUBJECTIVE:   Patient admitted to the hospital secondary to shortness of breath and acute respiratory failure with hypoxia.  Patient still complaining of some shortness of breath with exertion.  Admits to a cough which is nonproductive.  Afebrile.  REVIEW OF SYSTEMS:    Review of Systems  Constitutional: Negative for chills and fever.  HENT: Negative for congestion and tinnitus.   Eyes: Negative for blurred vision and double vision.  Respiratory: Positive for cough and shortness of breath. Negative for wheezing.   Cardiovascular: Negative for chest pain, orthopnea and PND.  Gastrointestinal: Negative for abdominal pain, diarrhea, nausea and vomiting.  Genitourinary: Negative for dysuria and hematuria.  Neurological: Negative for dizziness, sensory change and focal weakness.  All other systems reviewed and are negative.   Nutrition: Heart Healthy Tolerating Diet: Yes Tolerating PT: Ambulatory  DRUG ALLERGIES:   Allergies  Allergen Reactions  . Penicillins Rash, Other (See Comments) and Hives    Peeling around ankles, ?cough  . Darvon [Propoxyphene] Nausea Only and Rash    VITALS:  Blood pressure (!) 124/58, pulse 74, temperature 98.3 F (36.8 C), temperature source Oral, resp. rate 18, height 5\' 9"  (1.753 m), weight 95.3 kg, SpO2 91 %.  PHYSICAL EXAMINATION:   Physical Exam  GENERAL:  84 y.o.-year-old patient lying in bed in mild Resp. Distress.  EYES: Pupils equal, round, reactive to light and accommodation. No scleral icterus. Extraocular muscles intact.  HEENT: Head atraumatic, normocephalic. Oropharynx and nasopharynx clear.  NECK:  Supple, no jugular venous distention. No thyroid enlargement, no tenderness.  LUNGS: Good a/e b/l,  no wheezing, rales, rhonchi. + use of accessory muscles of respiration.  CARDIOVASCULAR: S1, S2  normal. No murmurs, rubs, or gallops.  ABDOMEN: Soft, nontender, nondistended. Bowel sounds present. No organomegaly or mass.  EXTREMITIES: No cyanosis, clubbing or edema b/l.    NEUROLOGIC: Cranial nerves II through XII are intact. No focal Motor or sensory deficits b/l.   PSYCHIATRIC: The patient is alert and oriented x 3.  SKIN: No obvious rash, lesion, or ulcer.    LABORATORY PANEL:   CBC Recent Labs  Lab 07/18/18 0543  WBC 10.6*  HGB 10.2*  HCT 29.2*  PLT 94*   ------------------------------------------------------------------------------------------------------------------  Chemistries  Recent Labs  Lab 07/17/18 1723 07/18/18 0543  NA 135 137  K 3.8 3.6  CL 103 104  CO2 23 24  GLUCOSE 149* 136*  BUN 23 26*  CREATININE 1.25* 1.33*  CALCIUM 8.3* 8.2*  AST 35  --   ALT 30  --   ALKPHOS 74  --   BILITOT 1.3*  --    ------------------------------------------------------------------------------------------------------------------  Cardiac Enzymes Recent Labs  Lab 07/17/18 1723  TROPONINI <0.03   ------------------------------------------------------------------------------------------------------------------  RADIOLOGY:  Dg Chest 2 View  Result Date: 07/17/2018 CLINICAL DATA:  Cough and short of breath 3 days EXAM: CHEST - 2 VIEW COMPARISON:  03/28/2015 FINDINGS: Diffuse patchy airspace disease bilaterally most prominent in the bases. Heart size within normal limits. Pulmonary hyperinflation. Small bilateral effusions IMPRESSION: COPD Diffuse bilateral airspace disease and small effusions. Probable heart failure although pneumonia is possible. Electronically Signed   By: Franchot Gallo M.D.   On: 07/17/2018 18:01     ASSESSMENT AND PLAN:   84 year old male with past medical history of COPD, obstructive sleep apnea, history of previous CVA, prostate  cancer who presented to the hospital due to shortness of breath.  1.  Acute respiratory failure with  hypoxia-secondary to COPD exacerbation. - Continue O2 supplementation, continue oral steroids will start on scheduled duo nebs, add Pulmicort nebs. - Treat community acquired pneumonia empirically with IV Levaquin. -Patient will likely need oxygen on discharge.  2.  COPD exacerbation-source of patient's worsening respiratory failure and hypoxia.   -Continue oral Prednisone, will add scheduled nebs, add Pulmicort nebs.  Next line-empiric Levaquin for underlying pneumonia.  Follow clinically.  Assess the patient for home oxygen prior to discharge.  3. Essential HTN - cont. Losartan  4. Depression - cont. Lexapro.   5. OSA - cont. CPAP/Bipap at bedtime.   6. Hx of previous CVA - cont. Aggrenox.   7. Thrombocytopenia - etiology unclear.  Will monitor.   - no acute bleeding. Possible referral to Hematology as outpatient.    All the records are reviewed and case discussed with Care Management/Social Worker. Management plans discussed with the patient, family and they are in agreement.  CODE STATUS: DNR  DVT Prophylaxis: Lovenox  TOTAL TIME TAKING CARE OF THIS PATIENT: 30 minutes.   POSSIBLE D/C IN 1-2 DAYS, DEPENDING ON CLINICAL CONDITION.   Henreitta Leber M.D on 07/18/2018 at 1:18 PM  Between 7am to 6pm - Pager - [phone]  After 6pm go to [email or web address] - Proofreader  [facility] [place] [facility]  Office  [phone]  CC: Primary care physician; Juluis Pitch, MD

## 2018-07-19 LAB — CBC
HEMATOCRIT: 27.8 % — AB (ref 39.0–52.0)
Hemoglobin: 9.2 g/dL — ABNORMAL LOW (ref 13.0–17.0)
MCH: 32.1 pg (ref 26.0–34.0)
MCHC: 33.1 g/dL (ref 30.0–36.0)
MCV: 96.9 fL (ref 80.0–100.0)
Platelets: 90 10*3/uL — ABNORMAL LOW (ref 150–400)
RBC: 2.87 MIL/uL — ABNORMAL LOW (ref 4.22–5.81)
RDW: 12.9 % (ref 11.5–15.5)
WBC: 8.8 10*3/uL (ref 4.0–10.5)
nRBC: 0 % (ref 0.0–0.2)

## 2018-07-19 LAB — BASIC METABOLIC PANEL
Anion gap: 9 (ref 5–15)
BUN: 39 mg/dL — ABNORMAL HIGH (ref 8–23)
CHLORIDE: 105 mmol/L (ref 98–111)
CO2: 20 mmol/L — ABNORMAL LOW (ref 22–32)
Calcium: 8.1 mg/dL — ABNORMAL LOW (ref 8.9–10.3)
Creatinine, Ser: 1.51 mg/dL — ABNORMAL HIGH (ref 0.61–1.24)
GFR calc Af Amer: 46 mL/min — ABNORMAL LOW (ref >60)
GFR calc non Af Amer: 40 mL/min — ABNORMAL LOW (ref >60)
Glucose, Bld: 149 mg/dL — ABNORMAL HIGH (ref 70–99)
Potassium: 3.7 mmol/L (ref 3.5–5.1)
Sodium: 134 mmol/L — ABNORMAL LOW (ref 135–145)

## 2018-07-19 MED ORDER — LEVOFLOXACIN 750 MG PO TABS: 750.0000 mg | ORAL_TABLET | ORAL | 0 refills | Status: AC

## 2018-07-19 MED ORDER — IPRATROPIUM-ALBUTEROL 0.5-2.5 (3) MG/3ML IN SOLN: 3.0000 mL | Freq: Four times a day (QID) | RESPIRATORY_TRACT | 0 refills | Status: DC | PRN

## 2018-07-19 MED ORDER — PREDNISONE 10 MG PO TABS: ORAL_TABLET | ORAL | 0 refills | Status: DC

## 2018-07-19 NOTE — Discharge Summary (Signed)
[facility] [place] NAME: Trevor Ferguson    MR#:  [id number]  DATE OF BIRTH:  Jan 03, 1926  DATE OF ADMISSION:  07/17/2018 ADMITTING PHYSICIAN: Demetrios Loll, MD  DATE OF DISCHARGE: 07/19/2018  PRIMARY CARE PHYSICIAN: Juluis Pitch, MD    ADMISSION DIAGNOSIS:  Shortness of breath [R06.02] Pneumonia due to infectious organism, unspecified laterality, unspecified part of lung [J18.9] Nausea and vomiting, intractability of vomiting not specified, unspecified vomiting type [R11.2]  DISCHARGE DIAGNOSIS:  Active Problems:   Pneumonia   SECONDARY DIAGNOSIS:   Past Medical History:  Diagnosis Date  . COPD (chronic obstructive pulmonary disease) ([place])   . Hypertension   . Pneumonia    [id number] hospitalized  . Prostate cancer ([place])    with seed placement  . Sleep apnea    noncompliant, dx 1996  . Stroke [facility]) 2012   no residuals    HOSPITAL COURSE:   84 year old male with past medical history of COPD, obstructive sleep apnea, history of previous CVA, prostate cancer who presented to the hospital due to shortness of breath.  1.  Acute respiratory failure with hypoxia-secondary to COPD exacerbation. -Patient was treated with scheduled duo nebs, Pulmicort nebs and oral prednisone. - Patient has improved.  He did qualify for home oxygen and therefore that is being arranged for him.  Patient will be discharged on an oral prednisone taper and empiric Levaquin for a few days.  2.  COPD exacerbation-source of patient's worsening respiratory failure and hypoxia.   -Patient was treated with scheduled duo nebs, Pulmicort nebs, oral prednisone.  Patient has improved.  He is being discharged on oral prednisone taper and maintenance of his inhalers.  Patient is also being set up for home oxygen and duo nebs as needed at home. - pt. Will also take empiric Levaquin for a few days.   3. Essential HTN - pt. Will cont. Losartan  4. Depression - pt.  Will cont. Lexapro.   5. OSA - pt. Will cont. CPAP/Bipap at bedtime.   6. Hx of previous CVA - pt. Will cont. Aggrenox.   7. Thrombocytopenia - stable.   - no acute bleeding and can be further followed as outpatient.    DISCHARGE CONDITIONS:   Stable.   CONSULTS OBTAINED:    DRUG ALLERGIES:   Allergies  Allergen Reactions  . Penicillins Rash, Other (See Comments) and Hives    Peeling around ankles, ?cough  . Darvon [Propoxyphene] Nausea Only and Rash    DISCHARGE MEDICATIONS:   Allergies as of 07/19/2018      Reactions   Penicillins Rash, Other (See Comments), Hives   Peeling around ankles, ?cough   Darvon [propoxyphene] Nausea Only, Rash      Medication List    TAKE these medications   Albuterol Sulfate 108 (90 Base) MCG/ACT Aepb Inhale 1-2 puffs into the lungs every 6 (six) hours as needed (shortness of breath / wheezing).   benzonatate 100 MG capsule Commonly known as:  TESSALON take 1 capsule by mouth three times a day if needed for cough   CENTRAL-VITE PO Take 1 tablet by mouth daily.   cetirizine 10 MG tablet Commonly known as:  ZYRTEC Take 10 mg by mouth daily.   dipyridamole-aspirin 200-25 MG 12hr capsule Commonly known as:  AGGRENOX Take 1 capsule by mouth 2 (two) times daily.   escitalopram 5 MG tablet Commonly known as:  LEXAPRO Take 5 mg by mouth daily.   fluticasone furoate-vilanterol 100-25 MCG/INH Aepb  Commonly known as:  BREO ELLIPTA Inhale 1 puff into the lungs daily.   fluticasone furoate-vilanterol 100-25 MCG/INH Aepb Commonly known as:  BREO ELLIPTA take 1 inhalation by mouth once daily   guaiFENesin-codeine 100-10 MG/5ML syrup Take 5 mLs by mouth every 4 (four) hours as needed for cough.   ipratropium-albuterol 0.5-2.5 (3) MG/3ML Soln Commonly known as:  DUONEB Take 3 mLs by nebulization every 6 (six) hours as needed.   levofloxacin 750 MG tablet Commonly known as:  LEVAQUIN Take 1 tablet (750 mg total) by mouth every  other day for 5 doses.   losartan-hydrochlorothiazide 100-25 MG tablet Commonly known as:  HYZAAR Take 1 tablet by mouth daily.   potassium chloride 10 MEQ tablet Commonly known as:  K-DUR take 1 tablet by mouth once daily if needed   predniSONE 10 MG tablet Commonly known as:  DELTASONE Label  & dispense according to the schedule below. 5 Pills PO for 1 day then, 4 Pills PO for 1 day, 3 Pills PO for 1 day, 2 Pills PO for 1 day, 1 Pill PO for 1 days then STOP. What changed:    medication strength  how much to take  how to take this  when to take this  additional instructions   Vitamin D-3 25 MCG (1000 UT) Caps Take 1,000 Units by mouth daily.            Durable Medical Equipment  (From admission, onward)         Start     Ordered   07/19/18 0917  For home use only DME Nebulizer machine  Once    Question:  Patient needs a nebulizer to treat with the following condition  Answer:  COPD (chronic obstructive pulmonary disease) ([place])   07/19/18 2706   07/19/18 0917  For home use only DME oxygen  Once    Question Answer Comment  Mode or (Route) Nasal cannula   Liters per Minute 3   Frequency Continuous (stationary and portable oxygen unit needed)   Oxygen conserving device Yes   Oxygen delivery system Gas      07/19/18 0917            DISCHARGE INSTRUCTIONS:   DIET:  Cardiac diet  DISCHARGE CONDITION:  Stable  ACTIVITY:  Activity as tolerated  OXYGEN:  Home Oxygen: Yes.     Oxygen Delivery: 3 liters/min via Patient connected to nasal cannula oxygen  DISCHARGE LOCATION:  Home    If you experience worsening of your admission symptoms, develop shortness of breath, life threatening emergency, suicidal or homicidal thoughts you must seek medical attention immediately by calling 911 or calling your MD immediately  if symptoms less severe.  You Must read complete instructions/literature along with all the possible adverse reactions/side effects for all  the Medicines you take and that have been prescribed to you. Take any new Medicines after you have completely understood and accpet all the possible adverse reactions/side effects.   Please note  You were cared for by a hospitalist during your hospital stay. If you have any questions about your discharge medications or the care you received while you were in the hospital after you are discharged, you can call the unit and asked to speak with the hospitalist on call if the hospitalist that took care of you is not available. Once you are discharged, your primary care physician will handle any further medical issues. Please note that NO REFILLS for any discharge medications will be authorized once  you are discharged, as it is imperative that you return to your primary care physician (or establish a relationship with a primary care physician if you do not have one) for your aftercare needs so that they can reassess your need for medications and monitor your lab values.     Today   No acute events overnight.  Shortness of breath has improved.  No fever, chest pain.  Will discharge home today on home oxygen.  VITAL SIGNS:  Blood pressure (!) 105/54, pulse (!) 57, temperature (!) 97.5 F (36.4 C), temperature source Oral, resp. rate 20, height 5\' 9"  (1.753 m), weight 95.3 kg, SpO2 95 %.  I/O:  No intake or output data in the 24 hours ending 07/19/18 1337  PHYSICAL EXAMINATION:   GENERAL:  84 y.o.-year-old patient lying in bed in NAD. EYES: Pupils equal, round, reactive to light and accommodation. No scleral icterus. Extraocular muscles intact.  HEENT: Head atraumatic, normocephalic. Oropharynx and nasopharynx clear.  NECK:  Supple, no jugular venous distention. No thyroid enlargement, no tenderness.  LUNGS: Good a/e b/l,  no wheezing, rales, rhonchi. (-) use of accessory muscles of respiration.  CARDIOVASCULAR: S1, S2 normal. No murmurs, rubs, or gallops.  ABDOMEN: Soft, nontender, nondistended.  Bowel sounds present. No organomegaly or mass.  EXTREMITIES: No cyanosis, clubbing or edema b/l.    NEUROLOGIC: Cranial nerves II through XII are intact. No focal Motor or sensory deficits b/l.   PSYCHIATRIC: The patient is alert and oriented x 3.  SKIN: No obvious rash, lesion, or ulcer.   DATA REVIEW:   CBC Recent Labs  Lab 07/19/18 0459  WBC 8.8  HGB 9.2*  HCT 27.8*  PLT 90*    Chemistries  Recent Labs  Lab 07/17/18 1723  07/19/18 0459  NA 135   < > 134*  K 3.8   < > 3.7  CL 103   < > 105  CO2 23   < > 20*  GLUCOSE 149*   < > 149*  BUN 23   < > 39*  CREATININE 1.25*   < > 1.51*  CALCIUM 8.3*   < > 8.1*  AST 35  --   --   ALT 30  --   --   ALKPHOS 74  --   --   BILITOT 1.3*  --   --    < > = values in this interval not displayed.    Cardiac Enzymes Recent Labs  Lab 07/17/18 1723  TROPONINI <0.03    Microbiology Results  Results for orders placed or performed during the hospital encounter of 07/17/18  Blood culture (routine x 2)     Status: None (Preliminary result)   Collection Time: 07/17/18  7:33 PM  Result Value Ref Range Status   Specimen Description BLOOD LEFT ANTECUBITAL  Final   Special Requests   Final    BOTTLES DRAWN AEROBIC AND ANAEROBIC Blood Culture results may not be optimal due to an excessive volume of blood received in culture bottles   Culture   Final    NO GROWTH 2 DAYS Performed at [facility], [address], [place],  [address]    Report Status PENDING  Incomplete  Blood culture (routine x 2)     Status: None (Preliminary result)   Collection Time: 07/17/18  7:33 PM  Result Value Ref Range Status   Specimen Description BLOOD RIGHT ANTECUBITAL  Final   Special Requests   Final    BOTTLES DRAWN AEROBIC AND  ANAEROBIC Blood Culture results may not be optimal due to an excessive volume of blood received in culture bottles   Culture   Final    NO GROWTH 2 DAYS Performed at [facility], [address] [place], [place] [address]    Report Status PENDING  Incomplete  MRSA PCR Screening     Status: None   Collection Time: 07/17/18  9:17 PM  Result Value Ref Range Status   MRSA by PCR NEGATIVE NEGATIVE Final    Comment:        The GeneXpert MRSA Assay (FDA approved for NASAL specimens only), is one component of a comprehensive MRSA colonization surveillance program. It is not intended to diagnose MRSA infection nor to guide or monitor treatment for MRSA infections. Performed at [facility], [address] [place], [place] [address]     RADIOLOGY:  Dg Chest 2 View  Result Date: 07/17/2018 CLINICAL DATA:  Cough and short of breath 3 days EXAM: CHEST - 2 VIEW COMPARISON:  03/28/2015 FINDINGS: Diffuse patchy airspace disease bilaterally most prominent in the bases. Heart size within normal limits. Pulmonary hyperinflation. Small bilateral effusions IMPRESSION: COPD Diffuse bilateral airspace disease and small effusions. Probable heart failure although pneumonia is possible. Electronically Signed   By: Franchot Gallo M.D.   On: 07/17/2018 18:01      Management plans discussed with the patient, family and they are in agreement.  CODE STATUS:     Code Status Orders  (From admission, onward)         Start     Ordered   07/17/18 2113  Do not attempt resuscitation (DNR)  Continuous    Question Answer Comment  In the event of cardiac or respiratory ARREST Do not call a "code blue"   In the event of cardiac or respiratory ARREST Do not perform Intubation, CPR, defibrillation or ACLS   In the event of cardiac or respiratory ARREST Use medication by any route, position, wound care, and other measures to relive pain and suffering. May use oxygen, suction and manual treatment of airway obstruction as needed for comfort.      07/17/18 2112          TOTAL TIME TAKING CARE OF THIS PATIENT: 40 minutes.    Henreitta Leber M.D on 07/19/2018 at 1:37 PM  Between 7am to 6pm  - Pager - [phone]  After 6pm go to [email or web address] - Proofreader  [facility]  Office  [phone]  CC: Primary care physician; Juluis Pitch, MD

## 2018-07-19 NOTE — Care Management Note (Signed)
Case Management Note  Patient Details  Name: [name] MRN: [id number] Date of Birth: 08/29/1925  Subjective/Objective:   Patient to leave with new orders for DME O2. [facility] used for DME nebulizer and O2. Portable tanks delivered to bedside. Patient to return to assisted living with transportation via daughter.                  Action/Plan:   Expected Discharge Date:  07/19/18               Expected Discharge Plan:  [place]  In-House Referral:     Discharge planning Services  CM Consult  Post Acute Care Choice:  Durable Medical Equipment Choice offered to:  Patient  DME Arranged:  Nebulizer machine, Oxygen DME Agency:  [place]:    [facility] Agency:     Status of Service:  Completed, signed off  If discussed at [place] of Stay Meetings, dates discussed:    Additional Comments:  Latanya Maudlin, RN 07/19/2018, 10:44 AM

## 2018-07-19 NOTE — Progress Notes (Signed)
SATURATION QUALIFICATIONS: (This note is used to comply with regulatory documentation for home oxygen)  Patient Saturations on Room Air at Rest = 80%  Patient Saturations on Room Air while Ambulating =78%  Patient Saturations on 3Liters of oxygen while Ambulating = 86% eventually improved to 90%  Please briefly explain why patient needs home oxygen:

## 2018-07-19 NOTE — Progress Notes (Signed)
Discharge instructions given and went over with patient at bedside. Prescriptions given and reviewed. Home equipment provided by CM. Patient verbalized understanding. Patient discharged home with daughter via wheelchair by nursing staff. Madlyn Frankel, RN

## 2018-07-22 LAB — CULTURE, BLOOD (ROUTINE X 2)
Culture: NO GROWTH
Culture: NO GROWTH

## 2018-07-23 ENCOUNTER — Telehealth: Payer: Self-pay

## 2018-07-23 NOTE — Telephone Encounter (Signed)
EMMI Follow-up: Noted on the report that the patient wasn't sure who to follow-up with in the event there was a change in his condition.  I talked with Trevor Ferguson and he had already scheduled his follow-up appointment with his PCP and knew to contact him for any changes in his condition.  I let him know there would be a second automated call with a different series of questions and to let us know if he had any concerns at that time.  No needs note for today.

## 2018-07-31 ENCOUNTER — Telehealth: Payer: Self-pay | Admitting: Cardiovascular Disease

## 2018-07-31 NOTE — Telephone Encounter (Signed)
Spoke with patient and reviewed that diagnosis was from back last year at his previous visit. He reports that he was in the hospital and is still having problems breathing. Scheduled him to come in and see Dr. Rockey Situ on 08/11/18 and that I would also send a message over to Dr. Zoila Shutter office to see if they can assist with getting him to follow up with the provider there as well. He was appreciative for the call with no further questions at this time.

## 2018-07-31 NOTE — Telephone Encounter (Signed)
Patient calling States he was hospitalized recently and noticed his paperwork says he had CHF Patient is concerned and wants to see Dr. Rockey Situ ASAP Declined first available on 12/24 Please call to discuss

## 2018-08-03 NOTE — Telephone Encounter (Signed)
LM on VM for patient to call and sch apt. Notified Dr. Mortimer Fries not in clinic the rest of the year, but we can schedule with another provider.

## 2018-08-04 NOTE — Telephone Encounter (Signed)
Contacted patient to scheduled hospital f/u. He had a f/u with [facility] and had to cancel due to 02 tanks not refilling. He will be rescheduling appointment ASAP with [facility]. Nothing further needed.

## 2018-08-10 ENCOUNTER — Telehealth: Payer: Self-pay

## 2018-08-10 ENCOUNTER — Emergency Department: Payer: Medicare PPO

## 2018-08-10 ENCOUNTER — Emergency Department
Admission: EM | Admit: 2018-08-10 | Discharge: 2018-08-10 | Disposition: A | Discharge status: Home / Self Care | Payer: Medicare PPO | Attending: Emergency Medicine | Admitting: Emergency Medicine

## 2018-08-10 ENCOUNTER — Other Ambulatory Visit: Payer: Self-pay

## 2018-08-10 ENCOUNTER — Encounter: Payer: Self-pay | Admitting: Intensive Care

## 2018-08-10 DIAGNOSIS — Z79899 Other long term (current) drug therapy: Secondary | ICD-10-CM | POA: Diagnosis not present

## 2018-08-10 DIAGNOSIS — R05 Cough: Secondary | ICD-10-CM | POA: Insufficient documentation

## 2018-08-10 DIAGNOSIS — R0602 Shortness of breath: Secondary | ICD-10-CM

## 2018-08-10 DIAGNOSIS — Z9981 Dependence on supplemental oxygen: Secondary | ICD-10-CM | POA: Insufficient documentation

## 2018-08-10 DIAGNOSIS — J449 Chronic obstructive pulmonary disease, unspecified: Secondary | ICD-10-CM | POA: Diagnosis not present

## 2018-08-10 DIAGNOSIS — Z87891 Personal history of nicotine dependence: Secondary | ICD-10-CM | POA: Insufficient documentation

## 2018-08-10 DIAGNOSIS — I5033 Acute on chronic diastolic (congestive) heart failure: Secondary | ICD-10-CM

## 2018-08-10 DIAGNOSIS — I110 Hypertensive heart disease with heart failure: Secondary | ICD-10-CM | POA: Diagnosis not present

## 2018-08-10 DIAGNOSIS — Z8546 Personal history of malignant neoplasm of prostate: Secondary | ICD-10-CM | POA: Diagnosis not present

## 2018-08-10 DIAGNOSIS — Z8673 Personal history of transient ischemic attack (TIA), and cerebral infarction without residual deficits: Secondary | ICD-10-CM | POA: Insufficient documentation

## 2018-08-10 LAB — CBC
HCT: 26.9 % — ABNORMAL LOW (ref 39.0–52.0)
Hemoglobin: 9.5 g/dL — ABNORMAL LOW (ref 13.0–17.0)
MCH: 31.9 pg (ref 26.0–34.0)
MCHC: 35.3 g/dL (ref 30.0–36.0)
MCV: 90.3 fL (ref 80.0–100.0)
Platelets: 145 10*3/uL — ABNORMAL LOW (ref 150–400)
RBC: 2.98 MIL/uL — ABNORMAL LOW (ref 4.22–5.81)
RDW: 12.7 % (ref 11.5–15.5)
WBC: 6.2 10*3/uL (ref 4.0–10.5)
nRBC: 0 % (ref 0.0–0.2)

## 2018-08-10 LAB — BLOOD GAS, VENOUS
Acid-Base Excess: 0.3 mmol/L (ref 0.0–2.0)
Bicarbonate: 24.6 mmol/L (ref 20.0–28.0)
O2 Saturation: 81.7 %
Patient temperature: 37
pCO2, Ven: 38 mmHg — ABNORMAL LOW (ref 44.0–60.0)
pH, Ven: 7.42 (ref 7.250–7.430)
pO2, Ven: 45 mmHg (ref 32.0–45.0)

## 2018-08-10 LAB — BASIC METABOLIC PANEL
Anion gap: 9 (ref 5–15)
BUN: 34 mg/dL — ABNORMAL HIGH (ref 8–23)
CO2: 24 mmol/L (ref 22–32)
Calcium: 8.4 mg/dL — ABNORMAL LOW (ref 8.9–10.3)
Chloride: 95 mmol/L — ABNORMAL LOW (ref 98–111)
Creatinine, Ser: 1.53 mg/dL — ABNORMAL HIGH (ref 0.61–1.24)
GFR calc Af Amer: 46 mL/min — ABNORMAL LOW (ref >60)
GFR calc non Af Amer: 39 mL/min — ABNORMAL LOW (ref >60)
Glucose, Bld: 125 mg/dL — ABNORMAL HIGH (ref 70–99)
Potassium: 3.7 mmol/L (ref 3.5–5.1)
Sodium: 128 mmol/L — ABNORMAL LOW (ref 135–145)

## 2018-08-10 LAB — BRAIN NATRIURETIC PEPTIDE: B Natriuretic Peptide: 204 pg/mL — ABNORMAL HIGH (ref 0.0–100.0)

## 2018-08-10 LAB — TROPONIN I

## 2018-08-10 LAB — INFLUENZA PANEL BY PCR (TYPE A & B)
INFLAPCR: NEGATIVE
INFLBPCR: NEGATIVE

## 2018-08-10 MED ORDER — FUROSEMIDE 10 MG/ML IJ SOLN
40.0000 mg | Freq: Once | INTRAMUSCULAR | Status: AC
  Administered 2018-08-10: 40 mg via INTRAVENOUS
  Filled 2018-08-10: qty 4

## 2018-08-10 NOTE — Discharge Instructions (Addendum)
Follow-up with Dr. Rockey Situ tomorrow as scheduled.  Is okay for you to increase the oxygen to 2-1/2 or 3 L.  Return to the ER for new, worsening, or persistent shortness of breath, cough, fever, chest pain, weakness, or any other new or worsening symptoms that concern you.  Your sodium level was somewhat low at 128.  This should be rechecked in the next week.

## 2018-08-10 NOTE — ED Provider Notes (Signed)
[facility] Emergency Department Provider Note ____________________________________________   First MD Initiated Contact with Patient 08/10/18 1632     (approximate)  I have reviewed the triage vital signs and the nursing notes.   HISTORY  Chief Complaint Shortness of Breath    HPI HAI GRABE is a 84 y.o. male with PMH as noted below who presents with shortness of breath, gradual onset over the last several days, associated with nonproductive cough, but no fever or chest pain.  The patient was admitted earlier this month with pneumonia and discharged on home oxygen.  He states he has not had to increase the oxygen.  He states he has not quite felt well since he left the hospital.  He denies vomiting, diarrhea, or urinary symptoms.  Past Medical History:  Diagnosis Date  . COPD (chronic obstructive pulmonary disease) ([place])   . Hypertension   . Pneumonia    [id number] hospitalized  . Prostate cancer ([place])    with seed placement  . Sleep apnea    noncompliant, dx 1996  . Stroke [facility]) 2012   no residuals    Patient Active Problem List   Diagnosis Date Noted  . Pneumonia 07/17/2018  . Leg swelling 12/18/2015  . Chronic diastolic CHF (congestive heart failure) ([place]) 07/18/2015  . Essential hypertension 07/18/2015  . COPD (chronic obstructive pulmonary disease) ([place]) 03/28/2015  . Sleep apnea 03/28/2015  . SOB (shortness of breath) 03/28/2015    Past Surgical History:  Procedure Laterality Date  . adenoid    . APPENDECTOMY    . HEMORRHOID SURGERY    . spine cyst    . TONSILLECTOMY      Prior to Admission medications   Medication Sig Start Date End Date Taking? Authorizing Provider  Albuterol Sulfate 108 (90 BASE) MCG/ACT AEPB Inhale 1-2 puffs into the lungs every 6 (six) hours as needed (shortness of breath / wheezing).     [provider]  benzonatate (TESSALON) 100 MG capsule take 1 capsule by mouth three times a day if needed  for cough 09/30/16   Flora Lipps, MD  cetirizine (ZYRTEC) 10 MG tablet Take 10 mg by mouth daily.    [provider]  Cholecalciferol (VITAMIN D-3) 25 MCG (1000 UT) CAPS Take 1,000 Units by mouth daily.     [provider]  dipyridamole-aspirin (AGGRENOX) 200-25 MG per 12 hr capsule Take 1 capsule by mouth 2 (two) times daily.    [provider]  escitalopram (LEXAPRO) 5 MG tablet Take 5 mg by mouth daily.    [provider]  fluticasone furoate-vilanterol (BREO ELLIPTA) 100-25 MCG/INH AEPB Inhale 1 puff into the lungs daily. 07/23/17   Flora Lipps, MD  fluticasone furoate-vilanterol (BREO ELLIPTA) 100-25 MCG/INH AEPB take 1 inhalation by mouth once daily 05/14/18   Flora Lipps, MD  guaiFENesin-codeine 100-10 MG/5ML syrup Take 5 mLs by mouth every 4 (four) hours as needed for cough. 07/14/17   Flora Lipps, MD  ipratropium-albuterol (DUONEB) 0.5-2.5 (3) MG/3ML SOLN Take 3 mLs by nebulization every 6 (six) hours as needed. 07/19/18   Henreitta Leber, MD  losartan-hydrochlorothiazide (HYZAAR) 100-25 MG per tablet Take 1 tablet by mouth daily.    [provider]  Multiple Vitamins-Minerals (CENTRAL-VITE PO) Take 1 tablet by mouth daily.    [provider]  potassium chloride (K-DUR) 10 MEQ tablet take 1 tablet by mouth once daily if needed 07/08/16   Minna Merritts, MD  predniSONE (DELTASONE) 10 MG tablet  Label  & dispense according to the schedule below. 5 Pills PO for 1 day then, 4 Pills PO for 1 day, 3 Pills PO for 1 day, 2 Pills PO for 1 day, 1 Pill PO for 1 days then STOP. 07/19/18   Henreitta Leber, MD    Allergies Penicillins and Darvon [propoxyphene]  Family History  Problem Relation Age of Onset  . Congestive Heart Failure Mother   . COPD Sister   . Asthma Sister   . Tuberculosis Maternal Grandmother   . Stroke Maternal Grandfather     Social History Social History   Tobacco Use  . Smoking status: Former Smoker    Years:  40.00    Types: Cigarettes    Last attempt to quit: 08/19/1985    Years since quitting: 32.9  . Smokeless tobacco: Never Used  Substance Use Topics  . Alcohol use: Yes    Alcohol/week: 14.0 standard drinks    Types: 14 Shots of liquor per week  . Drug use: No    Review of Systems  Constitutional: No fever. Eyes: No visual changes. ENT: No sore throat. Cardiovascular: Denies chest pain. Respiratory: Positive for shortness of breath. Gastrointestinal: No vomiting or diarrhea.  Genitourinary: Negative for dysuria.  Musculoskeletal: Negative for back pain. Skin: Negative for rash. Neurological: Negative for headache.   ____________________________________________   PHYSICAL EXAM:  VITAL SIGNS: ED Triage Vitals  Enc Vitals Group     BP 08/10/18 1355 (!) 132/48     Pulse Rate 08/10/18 1355 93     Resp 08/10/18 1355 (!) 22     Temp 08/10/18 1355 98.1 F (36.7 C)     Temp Source 08/10/18 1355 Oral     SpO2 08/10/18 1355 93 %     Weight 08/10/18 1356 205 lb (93 kg)     Height 08/10/18 1356 5\' 9"  (1.753 m)     Head Circumference --      Peak Flow --      Pain Score 08/10/18 1356 0     Pain Loc --      Pain Edu? --      Excl. in [place]? --     Constitutional: Alert and oriented.  Very well appearing for age and in no acute distress. Eyes: Conjunctivae are normal.  Head: Atraumatic. Nose: No congestion/rhinnorhea. Mouth/Throat: Mucous membranes are moist.   Neck: Normal range of motion.  Cardiovascular: Normal rate, regular rhythm. Grossly normal heart sounds.  Good peripheral circulation. Respiratory: Normal respiratory effort.  No retractions.  Faint rales to bilateral bases.. Gastrointestinal:  No distention.  Musculoskeletal: No lower extremity edema.  Extremities warm and well perfused.  Neurologic:  No gross focal neurologic deficits are appreciated.  Skin:  Skin is warm and dry. No rash noted. Psychiatric: Mood and affect are normal. Speech and behavior are  normal.  ____________________________________________   LABS (all labs ordered are listed, but only abnormal results are displayed)  Labs Reviewed  BASIC METABOLIC PANEL - Abnormal; Notable for the following components:      Result Value   Sodium 128 (*)    Chloride 95 (*)    Glucose, Bld 125 (*)    BUN 34 (*)    Creatinine, Ser 1.53 (*)    Calcium 8.4 (*)    GFR calc non Af Amer 39 (*)    GFR calc Af Amer 46 (*)    All other components within normal limits  CBC - Abnormal; Notable for the following components:  RBC 2.98 (*)    Hemoglobin 9.5 (*)    HCT 26.9 (*)    Platelets 145 (*)    All other components within normal limits  BRAIN NATRIURETIC PEPTIDE - Abnormal; Notable for the following components:   B Natriuretic Peptide 204.0 (*)    All other components within normal limits  BLOOD GAS, VENOUS - Abnormal; Notable for the following components:   pCO2, Ven 38 (*)    All other components within normal limits  TROPONIN I  INFLUENZA PANEL BY PCR (TYPE A & B)   ____________________________________________  EKG  ED ECG REPORT I, Arta Silence, the attending physician, personally viewed and interpreted this ECG.  Date: 08/10/2018 EKG Time: 1403 Rate: 93 Rhythm: normal sinus rhythm with PACs QRS Axis: normal Intervals: normal ST/T Wave abnormalities: normal Narrative Interpretation: no evidence of acute ischemia  ____________________________________________  RADIOLOGY  CXR: Cardiomegaly with interstitial edema  ____________________________________________   PROCEDURES  Procedure(s) performed: No  Procedures  Critical Care performed: No ____________________________________________   INITIAL IMPRESSION / ASSESSMENT AND PLAN / ED COURSE  Pertinent labs & imaging results that were available during my care of the patient were reviewed by me and considered in my medical decision making (see chart for details).  84 year old male with PMH as noted  above including COPD and diastolic CHF as well as recent admission for pneumonia presents with worsening shortness of breath and cough over the last several days, with no chest pain or fever.  I reviewed the past medical records in [place].  The patient was admitted from 11/29 to 12/1 with a primary diagnosis of pneumonia.  He was started on home O2 at that time.  On exam today, the patient is quite well-appearing for his age.  His vital signs are normal.  His O2 saturation is in the mid to high 90s on his normal home O2.  He has some rales at the bases bilaterally and the remainder of the exam is as described above.  EKG is nonischemic.  Chest x-ray from triage shows findings most consistent with CHF.  Differential would also include bronchitis or COPD exacerbation.  There is no clinical evidence for pneumonia.  I performed a bedside ultrasound and confirmed findings consistent with mild pulmonary edema.  I will treat with IV Lasix, obtain flu swab and BNP, and reassess.  ----------------------------------------- 6:44 PM on 08/10/2018 -----------------------------------------  The patient is feeling well.  His O2 saturation remains in the mid 90s on room air although it does dip down around 90 when he falls asleep.  He has no respiratory distress.  The lab work-up has some abnormalities (elevated creatinine, anemia) which are consistent with the patient's baseline.  He is slightly hyponatremic compared to baseline.  Since it appears that the patient has mild pulmonary edema, I do not think it would be helpful to treat with normal saline at this time.  Since the patient is asymptomatic I do not think that it needs to be repleted emergently.  The patient has follow-up already arranged with his cardiologist Dr. Rockey Situ for tomorrow morning.  The patient very much would like to go home and feels comfortable.  Especially given his follow-up tomorrow I think this is a safe plan.  I counseled the patient and  his daughter on the results of the work-up and the plan of care.  Return precautions given, and he expresses understanding.  ____________________________________________   FINAL CLINICAL IMPRESSION(S) / ED DIAGNOSES  Final diagnoses:  Acute on chronic diastolic congestive  heart failure (HCC)  Shortness of breath      NEW MEDICATIONS STARTED DURING THIS VISIT:  New Prescriptions   No medications on file     Note:  This document was prepared using Dragon voice recognition software and may include unintentional dictation errors.   Arta Silence, MD 08/10/18 [phone]

## 2018-08-10 NOTE — ED Triage Notes (Signed)
Patient here for worsening SOB. Patient wears 2L O2 continuously. Diagnosed with pnuemonia a couple weeks ago. Reports his doctors like a O2 saturation of 90% or above.

## 2018-08-10 NOTE — ED Notes (Signed)
Pt placed on monitor. edp at bedside

## 2018-08-10 NOTE — ED Notes (Signed)
Patient taken to car in wheelchair. Patient verbalized understanding of follow-up care and increased oxygen rate. Patient able to ambulate from wheelchair to car independently.

## 2018-08-10 NOTE — Telephone Encounter (Signed)
Spoke to patients daughter and wife in waiting room with their concerns about patient. They were worried about patient. He is having trouble breathing and keeping his sat above 90%. I advised them that they should take patient to the ED, where he can be evaluated and treated as necessary. Nothing further needed at this time.

## 2018-08-11 ENCOUNTER — Encounter: Payer: Self-pay | Admitting: Cardiovascular Disease

## 2018-08-11 ENCOUNTER — Ambulatory Visit (INDEPENDENT_AMBULATORY_CARE_PROVIDER_SITE_OTHER): Payer: Medicare PPO | Admitting: Cardiovascular Disease

## 2018-08-11 VITALS — BP 120/54 | HR 92 | Ht 69.0 in | Wt 199.4 lb

## 2018-08-11 DIAGNOSIS — I5032 Chronic diastolic (congestive) heart failure: Secondary | ICD-10-CM

## 2018-08-11 DIAGNOSIS — R0602 Shortness of breath: Secondary | ICD-10-CM | POA: Diagnosis not present

## 2018-08-11 DIAGNOSIS — G4730 Sleep apnea, unspecified: Secondary | ICD-10-CM

## 2018-08-11 DIAGNOSIS — J449 Chronic obstructive pulmonary disease, unspecified: Secondary | ICD-10-CM

## 2018-08-11 DIAGNOSIS — J189 Pneumonia, unspecified organism: Secondary | ICD-10-CM

## 2018-08-11 MED ORDER — FUROSEMIDE 20 MG PO TABS: 20.0000 mg | ORAL_TABLET | Freq: Every day | ORAL | 6 refills | Status: DC | PRN

## 2018-08-11 MED ORDER — POTASSIUM CHLORIDE CRYS ER 20 MEQ PO TBCR: 20.0000 meq | EXTENDED_RELEASE_TABLET | Freq: Every day | ORAL | 6 refills | Status: DC | PRN

## 2018-08-11 MED ORDER — GUAIFENESIN-CODEINE 100-10 MG/5ML PO SOLN: 5.0000 mL | ORAL | 0 refills | Status: DC | PRN

## 2018-08-11 NOTE — Progress Notes (Signed)
Cardiology Office Note  Date:  08/11/2018   ID:  RUBY DILONE, DOB [birth year], MRN [id number]  PCP:  Juluis Pitch, MD   Chief Complaint  Patient presents with  . other    CHF DX. SOB with exertion resolves with rest and O2.Medications reviewed verbally.     HPI:  Mr. Stayer is a 84 yo pleasant white male with  Coronary artery disease, mild three-vessel seen on CT scan chest 2014 Mild COPD with chronic bronchitis with DOE, Smoked 45 years sleep apnea on BiPAP,  hypertension,  stroke,   chronic diastolic heart failure,  prior echocardiogram with mild to moderately elevated right heart pressures in August 2016, normal ejection fraction.  Long smoking history  carotid ultrasound with minimal carotid disease He presents today for routine follow-up of his shortness of breath  Seen in the emergency room yesterday for shortness of breath presenting over several days with nonproductive cough admitted from 11/29 to 12/1 with a primary diagnosis of pneumonia.   Discharged on oxygen,  oral prednisone taper and empiric Levaquin for a few days. Hospital records reviewed with the patient in detail Not felt well since he left the hospital Lab work done showing sodium 128, creatinine 1.53 BUN 34 hemoglobin 9.5 BNP 204 EKG normal sinus rhythm Chest x-ray with mild cardiomegaly and worsening interstitial pulmonary edema  Renal function above baseline 1.25, most recent creatinine 1.53 BUN 34, sodium 128 down from 137, baseline hematocrit 30 now down to 26.9  Last echocardiogram 2016  He denies any significant lower extremity edema or abdominal bloating No significant change in his weight Weight down 3 pounds from 1 year ago  No chest pain on exertion On prior office visits had chronic stable mild shortness of breath Presents on 2 L nasal cannula oxygen today Sedentary at baseline Limited by her arthritides  On office visit last year Was not using BiPAP at nighttime, unable to  tolerate well Reports that he does have periods of desaturation down to 81%  Other past medical history reviewed CT chest 2014,  Mild to moderate CAD, 3 vessel,aorta atherosclerosis  EKG personally reviewed by myself on todays visit Shows normal sinus rhythm with PACs rate 94 bpm no significant ST or T wave changes  PMH:   has a past medical history of COPD (chronic obstructive pulmonary disease) ([place]), Hypertension, Pneumonia, Prostate cancer ([place]), Sleep apnea, and Stroke ([place]) (2012).  PSH:    Past Surgical History:  Procedure Laterality Date  . adenoid    . APPENDECTOMY    . HEMORRHOID SURGERY    . spine cyst    . TONSILLECTOMY      Current Outpatient Medications  Medication Sig Dispense Refill  . Albuterol Sulfate 108 (90 BASE) MCG/ACT AEPB Inhale 1-2 puffs into the lungs every 6 (six) hours as needed (shortness of breath / wheezing).     . Cholecalciferol (VITAMIN D-3) 25 MCG (1000 UT) CAPS Take 1,000 Units by mouth daily.     Marland Kitchen dipyridamole-aspirin (AGGRENOX) 200-25 MG per 12 hr capsule Take 1 capsule by mouth 2 (two) times daily.    Marland Kitchen escitalopram (LEXAPRO) 5 MG tablet Take 5 mg by mouth daily.    . fluticasone furoate-vilanterol (BREO ELLIPTA) 100-25 MCG/INH AEPB Inhale 1 puff into the lungs daily. 60 each 5  . ipratropium-albuterol (DUONEB) 0.5-2.5 (3) MG/3ML SOLN Take 3 mLs by nebulization every 6 (six) hours as needed. 360 mL 0  . losartan-hydrochlorothiazide (HYZAAR) 100-25 MG per tablet Take 1 tablet by mouth  daily.    . Multiple Vitamins-Minerals (CENTRAL-VITE PO) Take 1 tablet by mouth daily.    . potassium chloride (K-DUR) 10 MEQ tablet take 1 tablet by mouth once daily if needed (Patient not taking: Reported on 08/11/2018) 30 tablet 3   No current facility-administered medications for this visit.     Allergies:   Penicillins and Darvon [propoxyphene]   Social History:  The patient  reports that he quit smoking about 33 years ago. His smoking use included  cigarettes. He quit after 40.00 years of use. He has never used smokeless tobacco. He reports current alcohol use of about 14.0 standard drinks of alcohol per week. He reports that he does not use drugs.   Family History:   family history includes Asthma in his sister; COPD in his sister; Congestive Heart Failure in his mother; Stroke in his maternal grandfather; Tuberculosis in his maternal grandmother.    Review of Systems: Review of Systems  Constitutional: Negative.   Respiratory: Positive for shortness of breath.   Cardiovascular: Negative.   Gastrointestinal: Negative.   Musculoskeletal: Negative.   Neurological: Negative.   Psychiatric/Behavioral: Negative.   All other systems reviewed and are negative.   PHYSICAL EXAM: VS:  BP (!) 120/54 (BP Location: Left Arm, Patient Position: Sitting, Cuff Size: Normal)   Pulse 92   Ht 5\' 9"  (1.753 m)   Wt 199 lb 6.4 oz (90.4 kg)   BMI 29.45 kg/m  , BMI Body mass index is 29.45 kg/m. Constitutional:  oriented to person, place, and time. No distress.  HENT:  Head: Grossly normal Eyes:  no discharge. No scleral icterus.  Neck: No JVD, no carotid bruits  Cardiovascular: Regular rate and rhythm, no murmurs appreciated Pulmonary/Chest: Clear with scattered Rales at the bases Abdominal: Soft.  no distension.  no tenderness.  Musculoskeletal: Normal range of motion Neurological:  normal muscle tone. Coordination normal. No atrophy Skin: Skin warm and dry Psychiatric: normal affect, pleasant  Recent Labs: 07/17/2018: ALT 30 08/10/2018: B Natriuretic Peptide 204.0; BUN 34; Creatinine, Ser 1.53; Hemoglobin 9.5; Platelets 145; Potassium 3.7; Sodium 128    Lipid Panel No results found for: CHOL, HDL, LDLCALC, TRIG    Wt Readings from Last 3 Encounters:  08/11/18 199 lb 6.4 oz (90.4 kg)  08/10/18 205 lb (93 kg)  07/17/18 210 lb (95.3 kg)      ASSESSMENT AND PLAN:  Chronic obstructive pulmonary disease, unspecified COPD type ([place])  - Smoked 45 years, chronic shortness of breath on exertion Now on oxygen 2 L Symptoms of shortness of breath likely exacerbated by worsening anemia  Essential hypertension - Plan: EKG 12-Lead No change in his medications, blood pressure stable  Chronic diastolic CHF (congestive heart failure) ([place]) - Plan: EKG 12-Lead Fluid status unclear Details below  SOB (shortness of breath) - Plan: EKG 12-Lead Etiology of acute shortness of breath likely multifactorial Getting over pneumonia, new anemia past several weeks Fluid status unclear given lab work concerning for prerenal state, no leg edema no abdominal bloating, minimally elevated BNP --Recommended Lasix only as needed for worsening shortness of breath Echocardiogram ordered to estimate right heart pressures  Anemia Iron studies performed today Poor nutrition past month  Leg swelling - Plan: EKG 12-Lead No swelling on today's visit Less likely CHF symptoms, echo cardiogram pending  Sleep apnea, unspecified type - Plan: EKG 12-Lead Unable to tolerate BiPAP  CAD:  seen on CT scan 2014 Denies any anginal symptoms. We will continue to monitor  Long discussion with  patient and family at the bedside Discussed recent hospitalization, details reviewed, also discussed emergency room visit yesterday Differential of shortness of breath, reviewed lab work, anemia work-up Lab work ordered Discussed plan for the next several weeks  Total encounter time more than 45 minutes  Greater than 50% was spent in counseling and coordination of care with the patient  Disposition:   F/U  12 months   Orders Placed This Encounter  Procedures  . Iron, TIBC and Ferritin Panel  . Reticulocytes  . EKG 12-Lead     Signed, Esmond Plants, M.D., Ph.D. 08/11/2018  [place], [place]

## 2018-08-11 NOTE — Patient Instructions (Addendum)
Ok to take small amounts of sodium   Medication Instructions:  Take one lasix sparingly for shortness of breath, Take with potassium 20 mq x 1   If you need a refill on your cardiac medications before your next appointment, please call your pharmacy.    Lab work: Iron studies today for anemia   If you have labs (blood work) drawn today and your tests are completely normal, you will receive your results only by: Marland Kitchen MyChart Message (if you have MyChart) OR . A paper copy in the mail If you have any lab test that is abnormal or we need to change your treatment, we will call you to review the results.   Testing/Procedures: Your physician has requested that you have an echocardiogram. Echocardiography is a painless test that uses sound waves to create images of your heart. It provides your doctor with information about the size and shape of your heart and how well your heart's chambers and valves are working. This procedure takes approximately one hour. There are no restrictions for this procedure. You may get an IV, if needed, to receive an ultrasound enhancing agent through to better visualize your heart.    Follow-Up: At [facility], you and your health needs are our priority.  As part of our continuing mission to provide you with exceptional heart care, we have created designated Provider Care Teams.  These Care Teams include your primary Cardiologist (physician) and Advanced Practice Providers (APPs -  Physician Assistants and Nurse Practitioners) who all work together to provide you with the care you need, when you need it.  . You will need a follow up appointment in 1 month  . Providers on your designated Care Team:   . Murray Hodgkins, NP . Christell Faith, PA-C . Marrianne Mood, PA-C  Any Other Special Instructions Will Be Listed Below (If Applicable).  For educational health videos Log in to : [email or web address] Or : [email or web address], password : [email or web address]

## 2018-08-12 LAB — IRON,TIBC AND FERRITIN PANEL
Ferritin: 536 ng/mL — ABNORMAL HIGH (ref 30–400)
Iron Saturation: 18 % (ref 15–55)
Iron: 36 ug/dL — ABNORMAL LOW (ref 38–169)
Total Iron Binding Capacity: 202 ug/dL — ABNORMAL LOW (ref 250–450)
UIBC: 166 ug/dL (ref 111–343)

## 2018-08-12 LAB — RETICULOCYTES: Retic Ct Pct: 0.9 % (ref 0.6–2.6)

## 2018-08-13 ENCOUNTER — Telehealth: Payer: Self-pay | Admitting: Cardiovascular Disease

## 2018-08-13 NOTE — Telephone Encounter (Signed)
Patient daughter calling  Wanted to check on status of lab results from 12/24 - states that Dr Rockey Situ indicated to was an urgent matter so they are anxious for results Please call to discuss

## 2018-08-13 NOTE — Telephone Encounter (Signed)
Patients daughter wanted to know recent lab results. Reviewed preliminary findings and that once provider reviews I would give her a call back with any of his recommendations. She was appreciative for the call with no further questions at this time.

## 2018-08-14 NOTE — Telephone Encounter (Signed)
Call to pt to discuss lab results. Pt verbalized understanding and had no further questions at this time. Advised pt to call for any further questions or concerns

## 2018-08-14 NOTE — Telephone Encounter (Signed)
-----   Message from Minna Merritts, MD sent at 08/14/2018  3:20 PM EST ----- Lab work Iron studies are not critical, running little bit on the low side Ferritin is high Would still maintain diet rich in iron but does not need to be excessive Does not need iron infusion

## 2018-09-04 ENCOUNTER — Ambulatory Visit (INDEPENDENT_AMBULATORY_CARE_PROVIDER_SITE_OTHER): Payer: Medicare PPO

## 2018-09-04 ENCOUNTER — Other Ambulatory Visit: Payer: Self-pay

## 2018-09-04 DIAGNOSIS — I5032 Chronic diastolic (congestive) heart failure: Secondary | ICD-10-CM

## 2018-09-04 DIAGNOSIS — R0602 Shortness of breath: Secondary | ICD-10-CM | POA: Diagnosis not present

## 2018-09-08 ENCOUNTER — Telehealth: Payer: Self-pay | Admitting: Cardiovascular Disease

## 2018-09-08 NOTE — Telephone Encounter (Signed)
Pt daughter states pt is unable to swallow his potassium pill. Please call to discuss.

## 2018-09-08 NOTE — Telephone Encounter (Signed)
Spoke with patients daughter per release form and she states that he is having a hard time swallowing the potassium pill. Reviewed pill sizes available and recommended that he dissolve the pill in water and then mix with applesauce to take it and that next Monday when he comes in we can review options available. She verbalized understanding with no further questions at this time.

## 2018-09-13 NOTE — Progress Notes (Signed)
Cardiology Office Note  Date:  09/14/2018   ID:  DONTRAIL Trevor Ferguson, DOB March 15, 1926, MRN [id number]  PCP:  Trevor Pitch, MD   Chief Complaint  Patient presents with  . other    1 month follow up and discuss Echo. Meds reviewed by the pt. verbally. Pt. c/o shortness of breath.     HPI:  [name] is a 85 yo pleasant white male with  Coronary artery disease, mild three-vessel seen on CT scan chest 2014 Mild COPD with chronic bronchitis with DOE, Smoked 45 years sleep apnea on BiPAP,  hypertension,  stroke,   chronic diastolic heart failure,  prior echocardiogram with mild to moderately elevated right heart pressures in August 2016, normal ejection fraction.  Long smoking history  carotid ultrasound with minimal carotid disease History of medication confusion He presents today for routine follow-up of his shortness of breath  Presents today with his daughter Followed by pulmonary in [place], Reports he has several test scheduled Was taking lasix and potassium daily , for unclear reasons Weight down 9 pounds , not eating well Had lab work done at [facility] showing creatinine greater than 2, prerenal state Daughter is now helping with his medications given the confusion  On oxygen 2 liters,  He pushed it up to 4 liters on his own Does not appear to be checking his saturations at home Bathing without the oxygen, gets ready in the morning, reports that he feels fine off the oxygen  Denies any lower extremity edema, no abdominal bloating, no PND orthopnea  Echocardiogram September 04, 2018, results reviewed with him in detail Performed for shortness of breath Normal LV function ejection fraction 60%, normal RV size and function, mildly elevated right heart pressures  Reports that he continues on low-dose prednisone under the direction of pulmonary  Creatinine in 2018 typically running up to 1.4, 1.5 Likely slight increase above his normal baseline given periodic diuretic use for  ankle swelling, shortness of breath symptoms  EKG personally reviewed by myself on todays visit Shows normal sinus rhythm rate 71 bpm no significant ST or T wave changes  Other past medical history reviewed shortness of breath ,cough admitted from 11/29 to 12/1 with a primary diagnosis of pneumonia.   Discharged on oxygen,  oral prednisone taper and empiric Levaquin for a few days. Lab work done showing sodium 128, creatinine 1.53 BUN 34 hemoglobin 9.5 BNP 204 EKG normal sinus rhythm Chest x-ray with mild cardiomegaly and worsening interstitial pulmonary edema  CT chest 2014,  Mild to moderate CAD, 3 vessel,aorta atherosclerosis   PMH:   has a past medical history of COPD (chronic obstructive pulmonary disease) (Trevor Ferguson), Hypertension, Pneumonia, Prostate cancer (Trevor Ferguson), Sleep apnea, and Stroke ([place]) (2012).  PSH:    Past Surgical History:  Procedure Laterality Date  . adenoid    . APPENDECTOMY    . HEMORRHOID SURGERY    . spine cyst    . TONSILLECTOMY      Current Outpatient Medications  Medication Sig Dispense Refill  . albuterol (ACCUNEB) 0.63 MG/3ML nebulizer solution Take 1 ampule by nebulization every 4 (four) hours as needed.     . Cholecalciferol (VITAMIN D-3) 25 MCG (1000 UT) CAPS Take 1,000 Units by mouth daily.     Marland Kitchen dipyridamole-aspirin (AGGRENOX) 200-25 MG per 12 hr capsule Take 1 capsule by mouth 2 (two) times daily.    Marland Kitchen escitalopram (LEXAPRO) 5 MG tablet Take 5 mg by mouth daily.    . fluticasone furoate-vilanterol (BREO ELLIPTA) 100-25  MCG/INH AEPB Inhale 1 puff into the lungs daily. 60 each 5  . furosemide (LASIX) 20 MG tablet Take 1 tablet (20 mg total) by mouth daily as needed. Use sparingly. Take potassium when you take furosemide. 30 tablet 6  . losartan-hydrochlorothiazide (HYZAAR) 100-25 MG per tablet Take 1 tablet by mouth daily.    . Multiple Vitamins-Minerals (CENTRAL-VITE PO) Take 1 tablet by mouth daily.    . potassium chloride SA (K-DUR,KLOR-CON) 20 MEQ  tablet Take 1 tablet (20 mEq total) by mouth daily as needed. Take when you take the furosemide. 30 tablet 6  . predniSONE (DELTASONE) 5 MG tablet Take by mouth.    Marland Kitchen PROAIR HFA 108 (90 Base) MCG/ACT inhaler INHALE 2 INHALATIONS INTO THE LUNGS Q 6 H PRF WHEEZING OR SOB OR COUGH    . SPIRIVA RESPIMAT 2.5 MCG/ACT AERS INL 2 INHALATIONS ITL QD     No current facility-administered medications for this visit.     Allergies:   Penicillins and Darvon [propoxyphene]   Social History:  The patient  reports that he quit smoking about 33 years ago. His smoking use included cigarettes. He quit after 40.00 years of use. He has never used smokeless tobacco. He reports current alcohol use of about 14.0 standard drinks of alcohol per week. He reports that he does not use drugs.   Family History:   family history includes Asthma in his sister; COPD in his sister; Congestive Heart Failure in his mother; Stroke in his maternal grandfather; Tuberculosis in his maternal grandmother.    Review of Systems: Review of Systems  Constitutional: Negative.   Respiratory: Positive for shortness of breath.   Cardiovascular: Negative.   Gastrointestinal: Negative.   Musculoskeletal: Negative.   Neurological: Negative.   Psychiatric/Behavioral: Negative.   All other systems reviewed and are negative.   PHYSICAL EXAM: VS:  BP (!) 120/48 (BP Location: Left Arm, Patient Position: Sitting, Cuff Size: Normal)   Pulse 71   Ht 5\' 9"  (1.753 m)   Wt 190 lb 4 oz (86.3 kg)   SpO2 96%   BMI 28.10 kg/m  , BMI Body mass index is 28.1 kg/m. Constitutional:  oriented to person, place, and time. No distress.  HENT:  Head: Grossly normal Eyes:  no discharge. No scleral icterus.  Neck: No JVD, no carotid bruits  Cardiovascular: Regular rate and rhythm, no murmurs appreciated Pulmonary/Chest: Clear to auscultation bilaterally, no wheezes or rails Abdominal: Soft.  no distension.  no tenderness.  Musculoskeletal: Normal range  of motion Neurological:  normal muscle tone. Coordination normal. No atrophy Skin: Skin warm and dry Psychiatric: normal affect, pleasant  Recent Labs: 07/17/2018: ALT 30 08/10/2018: B Natriuretic Peptide 204.0; BUN 34; Creatinine, Ser 1.53; Hemoglobin 9.5; Platelets 145; Potassium 3.7; Sodium 128    Lipid Panel No results found for: CHOL, HDL, LDLCALC, TRIG    Wt Readings from Last 3 Encounters:  09/14/18 190 lb 4 oz (86.3 kg)  08/11/18 199 lb 6.4 oz (90.4 kg)  08/10/18 205 lb (93 kg)      ASSESSMENT AND PLAN:  Chronic obstructive pulmonary disease, unspecified COPD type (HCC) - Smoked 45 years, chronic shortness of breath on exertion Now on oxygen Managed by pulmonary in [place] On several inhalers  Essential hypertension - Plan: EKG 12-Lead No changes to his blood pressure medication He will continue to monitor numbers at home  Chronic diastolic CHF (congestive heart failure) ([place]) -  By recent lab work appears dehydrated He was taking Lasix with potassium  daily out of some medication confusion His was previously recommended that he take sparingly only for ankle swelling, weight gain, abdominal bloating Daughter now helping him with his medications  Pulmonary hypertension Pressures of 40 mmHg even in the setting of dehydration September 04, 2017 Pressures will likely trend upwards as he hydrates Suspect for comfort he will do better with creatinine 1.4 up to 1.5, slightly prerenal Recent creatinine greater than 2 well above his baseline  SOB (shortness of breath) - Plan: EKG 12-Lead Predominately secondary to underlying lung disease, pulmonary hypertension, deconditioning, anemia Weight is slowly decreasing secondary to change in diet No further cardiac work-up needed at this time  Anemia Hemoglobin 9.5 August 10, 2018 Likely contributing to shortness of breath Iron studies stable  Leg swelling - Plan: EKG 12-Lead No swelling on today's visit Discussed with  his daughter, recommended they only take Lasix sparingly with potassium for worsening ankle swelling  Sleep apnea, unspecified type - Plan: EKG 12-Lead Unable to tolerate BiPAP  CAD:  seen on CT scan 2014 Denies any anginal symptoms. We will continue to monitor  Long discussion with him concerning his underlying lung disease, need for various inhalers, testing he has plan through pulmonary -Discussed echocardiogram results in detail Discussed pulmonary hypertension,  Recommended monitoring of oxygen saturations with and without his oxygen, Recovery from bronchitis Answered all questions concerning the medications  Total encounter time more than 45 minutes  Greater than 50% was spent in counseling and coordination of care with the patient  Disposition:   F/U  6 months   Orders Placed This Encounter  Procedures  . EKG 12-Lead     Signed, Esmond Plants, M.D., Ph.D. 09/14/2018  [place], [place]

## 2018-09-14 ENCOUNTER — Ambulatory Visit (INDEPENDENT_AMBULATORY_CARE_PROVIDER_SITE_OTHER): Payer: Medicare PPO | Admitting: Cardiovascular Disease

## 2018-09-14 ENCOUNTER — Encounter: Payer: Self-pay | Admitting: Cardiovascular Disease

## 2018-09-14 VITALS — BP 120/48 | HR 71 | Ht 69.0 in | Wt 190.2 lb

## 2018-09-14 DIAGNOSIS — I10 Essential (primary) hypertension: Secondary | ICD-10-CM

## 2018-09-14 DIAGNOSIS — J449 Chronic obstructive pulmonary disease, unspecified: Secondary | ICD-10-CM

## 2018-09-14 DIAGNOSIS — M7989 Other specified soft tissue disorders: Secondary | ICD-10-CM

## 2018-09-14 DIAGNOSIS — I5032 Chronic diastolic (congestive) heart failure: Secondary | ICD-10-CM | POA: Diagnosis not present

## 2018-09-14 DIAGNOSIS — R0602 Shortness of breath: Secondary | ICD-10-CM | POA: Diagnosis not present

## 2018-09-14 DIAGNOSIS — G4730 Sleep apnea, unspecified: Secondary | ICD-10-CM

## 2018-09-14 NOTE — Patient Instructions (Addendum)

## 2018-12-28 DIAGNOSIS — F419 Anxiety disorder, unspecified: Secondary | ICD-10-CM | POA: Insufficient documentation

## 2019-04-27 ENCOUNTER — Other Ambulatory Visit: Payer: Self-pay

## 2019-04-27 DIAGNOSIS — Z20822 Contact with and (suspected) exposure to covid-19: Secondary | ICD-10-CM

## 2019-04-29 LAB — NOVEL CORONAVIRUS, NAA: SARS-CoV-2, NAA: NOT DETECTED

## 2019-06-07 ENCOUNTER — Other Ambulatory Visit: Payer: Self-pay

## 2019-06-07 ENCOUNTER — Ambulatory Visit (INDEPENDENT_AMBULATORY_CARE_PROVIDER_SITE_OTHER): Payer: Medicare PPO | Admitting: *Deleted

## 2019-06-07 DIAGNOSIS — Z23 Encounter for immunization: Secondary | ICD-10-CM

## 2019-07-05 ENCOUNTER — Other Ambulatory Visit: Payer: Self-pay

## 2019-07-05 DIAGNOSIS — Z20822 Contact with and (suspected) exposure to covid-19: Secondary | ICD-10-CM

## 2019-07-07 LAB — NOVEL CORONAVIRUS, NAA: SARS-CoV-2, NAA: NOT DETECTED

## 2019-07-12 ENCOUNTER — Other Ambulatory Visit: Payer: Self-pay

## 2019-07-12 DIAGNOSIS — Z20822 Contact with and (suspected) exposure to covid-19: Secondary | ICD-10-CM

## 2019-07-14 LAB — NOVEL CORONAVIRUS, NAA: SARS-CoV-2, NAA: NOT DETECTED

## 2019-09-14 ENCOUNTER — Telehealth: Payer: Self-pay | Admitting: Cardiovascular Disease

## 2019-09-14 NOTE — Telephone Encounter (Signed)
To Dr. Gollan to review.  

## 2019-09-14 NOTE — Telephone Encounter (Signed)
Pt c/o medication issue:  1. Name of Medication: aggrenox generic   2. How are you currently taking this medication (dosage and times per day)? 200-25 mg po BID   3. Are you having a reaction (difficulty breathing--STAT)?  No   4. What is your medication issue? Per insurance no longer preferred and will increase cost patient was told the following would be accepted :     cilostazol  clopidogrel   Dipyridamole

## 2019-09-19 NOTE — Telephone Encounter (Signed)
Aggrenox not started by cardiology From our perspective, he can take asa 81 mg daily, stop aggrenox

## 2019-09-20 MED ORDER — ASPIRIN EC 81 MG PO TBEC: 81.0000 mg | DELAYED_RELEASE_TABLET | Freq: Every day | ORAL | 3 refills | Status: DC

## 2019-09-20 NOTE — Telephone Encounter (Signed)
Spoke with patient and reviewed provider recommendations to discontinue and start ASA 81 mg once daily. He verbalized understanding with no further questions at this time.

## 2019-10-20 ENCOUNTER — Ambulatory Visit (INDEPENDENT_AMBULATORY_CARE_PROVIDER_SITE_OTHER): Payer: Medicare PPO | Admitting: Cardiovascular Disease

## 2019-10-20 ENCOUNTER — Encounter: Payer: Self-pay | Admitting: Cardiovascular Disease

## 2019-10-20 ENCOUNTER — Other Ambulatory Visit: Payer: Self-pay

## 2019-10-20 VITALS — BP 132/70 | HR 81 | Ht 69.0 in | Wt 213.0 lb

## 2019-10-20 DIAGNOSIS — I5032 Chronic diastolic (congestive) heart failure: Secondary | ICD-10-CM | POA: Diagnosis not present

## 2019-10-20 DIAGNOSIS — J449 Chronic obstructive pulmonary disease, unspecified: Secondary | ICD-10-CM | POA: Diagnosis not present

## 2019-10-20 DIAGNOSIS — R0602 Shortness of breath: Secondary | ICD-10-CM | POA: Diagnosis not present

## 2019-10-20 DIAGNOSIS — G4730 Sleep apnea, unspecified: Secondary | ICD-10-CM

## 2019-10-20 DIAGNOSIS — I10 Essential (primary) hypertension: Secondary | ICD-10-CM | POA: Diagnosis not present

## 2019-10-20 NOTE — Patient Instructions (Addendum)
Leg elevation, ankle compression, TED hose  Phone numbers for pulmonary: Dr. Lanney Gins [phone]   Medication Instructions:  Take lasix 20 mg Monday and Friday, with potassium  Monitor blood pressure If still elevated, We might need coreg 6.25 mg twice a day  If you need a refill on your cardiac medications before your next appointment, please call your pharmacy.    Lab work: BMP in one month   If you have labs (blood work) drawn today and your tests are completely normal, you will receive your results only by: Marland Kitchen MyChart Message (if you have MyChart) OR . A paper copy in the mail If you have any lab test that is abnormal or we need to change your treatment, we will call you to review the results.   Testing/Procedures: No new testing needed   Follow-Up: At [facility], you and your health needs are our priority.  As part of our continuing mission to provide you with exceptional heart care, we have created designated Provider Care Teams.  These Care Teams include your primary Cardiologist (physician) and Advanced Practice Providers (APPs -  Physician Assistants and Nurse Practitioners) who all work together to provide you with the care you need, when you need it.  . You will need a follow up appointment in 6 months .  Marland Kitchen Providers on your designated Care Team:   . Murray Hodgkins, NP . Christell Faith, PA-C . Marrianne Mood, PA-C  Any Other Special Instructions Will Be Listed Below (If Applicable).  For educational health videos Log in to : www.myemmi.com Or : [email or web address], password : [email or web address]   How to Take Your Blood Pressure You can take your blood pressure at home with a machine. You may need to check your blood pressure at home:  To check if you have high blood pressure (hypertension).  To check your blood pressure over time.  To make sure your blood pressure medicine is working. Supplies needed: You will need a blood pressure machine, or monitor. You can  buy one at a drugstore or online. When choosing one:  Choose one with an arm cuff.  Choose one that wraps around your upper arm. Only one finger should fit between your arm and the cuff.  Do not choose one that measures your blood pressure from your wrist or finger. Your doctor can suggest a monitor. How to prepare Avoid these things for 30 minutes before checking your blood pressure:  Drinking caffeine.  Drinking alcohol.  Eating.  Smoking.  Exercising. Five minutes before checking your blood pressure:  Pee.  Sit in a dining chair. Avoid sitting in a soft couch or armchair.  Be quiet. Do not talk. How to take your blood pressure Follow the instructions that came with your machine. If you have a digital blood pressure monitor, these may be the instructions: 1. Sit up straight. 2. Place your feet on the floor. Do not cross your ankles or legs. 3. Rest your left arm at the level of your heart. You may rest it on a table, desk, or chair. 4. Pull up your shirt sleeve. 5. Wrap the blood pressure cuff around the upper part of your left arm. The cuff should be 1 inch (2.5 cm) above your elbow. It is best to wrap the cuff around bare skin. 6. Fit the cuff snugly around your arm. You should be able to place only one finger between the cuff and your arm. 7. Put the cord inside the groove of your elbow. 8. Press  the power button. 9. Sit quietly while the cuff fills with air and loses air. 10. Write down the numbers on the screen. 11. Wait 2-3 minutes and then repeat steps 1-10. What do the numbers mean? Two numbers make up your blood pressure. The first number is called systolic pressure. The second is called diastolic pressure. An example of a blood pressure reading is "120 over 80" (or 120/80). If you are an adult and do not have a medical condition, use this guide to find out if your blood pressure is normal: Normal  First number: below 120.  Second number: below  80. Elevated  First number: 120-129.  Second number: below 80. Hypertension stage 1  First number: 130-139.  Second number: 80-89. Hypertension stage 2  First number: 140 or above.  Second number: 41 or above. Your blood pressure is above normal even if only the top or bottom number is above normal. Follow these instructions at home:  Check your blood pressure as often as your doctor tells you to.  Take your monitor to your next doctor's appointment. Your doctor will: ? Make sure you are using it correctly. ? Make sure it is working right.  Make sure you understand what your blood pressure numbers should be.  Tell your doctor if your medicines are causing side effects. Contact a doctor if:  Your blood pressure keeps being high. Get help right away if:  Your first blood pressure number is higher than 180.  Your second blood pressure number is higher than 120. This information is not intended to replace advice given to you by your health care provider. Make sure you discuss any questions you have with your health care provider. Document Revised: 07/18/2017 Document Reviewed: 01/12/2016 Elsevier Patient Education  2020 [employer].

## 2019-10-20 NOTE — Progress Notes (Signed)
Cardiology Office Note  Date:  10/20/2019   ID:  Trevor Ferguson, DOB December 16, 1925, MRN [id number]  PCP:  Juluis Pitch, MD   Chief Complaint  Patient presents with  . office visit    6 month F/U-Patient reports SOB and lack of energy. Patient reports elevated BP and pulse readings at home recently; Meds verbally reviewed with patient.    HPI:  Trevor Ferguson is a 86 yo pleasant white male with  Coronary artery disease, mild three-vessel seen on CT scan chest 2014 Mild COPD with chronic bronchitis with DOE, Smoked 45 years sleep apnea on BiPAP,  hypertension,  stroke,   chronic diastolic heart failure,  prior echocardiogram with mild to moderately elevated right heart pressures in August 2016, normal ejection fraction.  Long smoking history  carotid ultrasound with minimal carotid disease History of medication confusion He presents today for routine follow-up of his shortness of breath, CAD  Stress at home Wife fell, fractured her ankle, now in the nursing care at [facility] Having some confusion  Reports is blood pressures have been labile, pulse erratic Upset when he talks to or thinks about his wife in nursing care  160 to 180 at home Only start coreg 3.125 mg BID, had 2 dose last night and this morning BP today 130/70  Trace edema in ankle, does not take Lasix Sedentary, lots of sitting at the computer  CT reviewed from 09/2018 [place], COPD  On oxygen at home Not when going out, Does nebs in the AM Saturations in the office today 88% Reports he is very short of breath when walking around  Previously seen by pulmonary in [place], Renal function reviewed from past year or so through primary care records  Echocardiogram September 04, 2018, Performed for shortness of breath Normal LV function ejection fraction 60%, normal RV size and function, mildly elevated right heart pressures  EKG personally reviewed by myself on todays visit Shows normal sinus rhythm/ectopic  atrial rhythm, sinus arrhythmia noted, PACs rate 81 bpm no significant ST or T wave changes  Other past medical history reviewed shortness of breath ,cough admitted from 11/29 to 12/1 with a primary diagnosis of pneumonia.   Discharged on oxygen,  oral prednisone taper and empiric Levaquin for a few days. Lab work done showing sodium 128, creatinine 1.53 BUN 34 hemoglobin 9.5 BNP 204 EKG normal sinus rhythm Chest x-ray with mild cardiomegaly and worsening interstitial pulmonary edema  CT chest 2014,  Mild to moderate CAD, 3 vessel,aorta atherosclerosis   PMH:   has a past medical history of COPD (chronic obstructive pulmonary disease) ([place]), Hypertension, Pneumonia, Prostate cancer ([place]), Sleep apnea, and Stroke ([place]) (2012).  PSH:    Past Surgical History:  Procedure Laterality Date  . adenoid    . APPENDECTOMY    . HEMORRHOID SURGERY    . spine cyst    . TONSILLECTOMY      Current Outpatient Medications  Medication Sig Dispense Refill  . aspirin EC 81 MG tablet Take 1 tablet (81 mg total) by mouth daily. 90 tablet 3  . carvedilol (COREG) 3.125 MG tablet Take 3.125 mg by mouth in the morning and at bedtime.    . Cholecalciferol (VITAMIN D-3) 25 MCG (1000 UT) CAPS Take 1,000 Units by mouth daily.     Marland Kitchen escitalopram (LEXAPRO) 5 MG tablet Take 5 mg by mouth daily.    . fluticasone furoate-vilanterol (BREO ELLIPTA) 100-25 MCG/INH AEPB Inhale 1 puff into the lungs daily. 60 each 5  .  furosemide (LASIX) 20 MG tablet Take 1 tablet (20 mg total) by mouth daily as needed. Use sparingly. Take potassium when you take furosemide. 30 tablet 6  . losartan-hydrochlorothiazide (HYZAAR) 100-25 MG per tablet Take 1 tablet by mouth daily.    . Multiple Vitamins-Minerals (CENTRAL-VITE PO) Take 1 tablet by mouth daily.    . potassium chloride SA (K-DUR,KLOR-CON) 20 MEQ tablet Take 1 tablet (20 mEq total) by mouth daily as needed. Take when you take the furosemide. 30 tablet 6  . PROAIR HFA 108 (90  Base) MCG/ACT inhaler INHALE 2 INHALATIONS INTO THE LUNGS Q 6 H PRF WHEEZING OR SOB OR COUGH    . SPIRIVA RESPIMAT 2.5 MCG/ACT AERS INL 2 INHALATIONS ITL QD     No current facility-administered medications for this visit.    Allergies:   Penicillins and Darvon [propoxyphene]   Social History:  The patient  reports that he quit smoking about 34 years ago. His smoking use included cigarettes. He quit after 40.00 years of use. He has never used smokeless tobacco. He reports current alcohol use of about 14.0 standard drinks of alcohol per week. He reports that he does not use drugs.   Family History:   family history includes Asthma in his sister; COPD in his sister; Congestive Heart Failure in his mother; Stroke in his maternal grandfather; Tuberculosis in his maternal grandmother.    Review of Systems: Review of Systems  Constitutional: Negative.   Respiratory: Positive for shortness of breath.   Cardiovascular: Positive for leg swelling.  Gastrointestinal: Negative.   Musculoskeletal: Negative.   Neurological: Negative.   Psychiatric/Behavioral: Negative.   All other systems reviewed and are negative.   PHYSICAL EXAM: VS:  BP 132/70 (BP Location: Left Arm, Patient Position: Sitting, Cuff Size: Normal)   Pulse 81   Ht 5\' 9"  (1.753 m)   Wt 213 lb (96.6 kg)   SpO2 (!) 88%   BMI 31.45 kg/m  , BMI Body mass index is 31.45 kg/m. Constitutional:  oriented to person, place, and time. No distress.  HENT:  Head: Grossly normal Eyes:  no discharge. No scleral icterus.  Neck: No JVD, no carotid bruits  Cardiovascular: Regular rate and rhythm, no murmurs appreciated Trace ankle edema Pulmonary/Chest: Clear to auscultation bilaterally, no wheezes or rails Abdominal: Soft.  no distension.  no tenderness.  Musculoskeletal: Normal range of motion Neurological:  normal muscle tone. Coordination normal. No atrophy Skin: Skin warm and dry Psychiatric: normal affect, pleasant   Recent  Labs: No results found for requested labs within last 8760 hours.    Lipid Panel No results found for: CHOL, HDL, LDLCALC, TRIG    Wt Readings from Last 3 Encounters:  10/20/19 213 lb (96.6 kg)  09/14/18 190 lb 4 oz (86.3 kg)  08/11/18 199 lb 6.4 oz (90.4 kg)      ASSESSMENT AND PLAN:  Chronic obstructive pulmonary disease, unspecified COPD type (HCC) - Smoked 45 years, chronic shortness of breath on exertion Now on oxygen Managed by pulmonary in [place] On several inhalers -Recommended he add Lasix 20 two times a week with potassium given trace ankle edema, concern for diastolic CHF  Essential hypertension - Plan: EKG 12-Lead Blood pressure stable, recently started on carvedilol 3.125 mg twice daily Suggested he monitor blood pressure at home and if this continues to run high we could increase the Coreg up to 6.25 twice daily  Chronic diastolic CHF (congestive heart failure) ([place]) -  Recommend he take Lasix 2 times a  week for ankle swelling, weight gain, abdominal bloating He does have worsening shortness of breath, basically incapacitated, hypoxia today saturations in the high 80s but not on oxygen Suspect he will need to carry oxygen 24/7  Pulmonary hypertension Noted on previous echocardiograms Recommend Lasix 20 twice daily for symptom relief We will need close monitoring of renal function  Recent creatinine greater than 2 well above his baseline and SOB (shortness of breath) - Plan: EKG 12-Lead Predominately secondary to underlying lung disease, pulmonary hypertension, deconditioning, anemia Weight is slowly decreasing secondary to change in diet No further cardiac work-up needed at this time  Anemia Hemoglobin 9.5 August 10, 2018 Numbers have improved, November 2020 back at baseline  Leg swelling - Plan: EKG 12-Lead Mild pitting ankle swelling Reports feet feel tight, she was hard to get on Will recommend Lasix 2 times a week with potassium  Sleep apnea,  unspecified type - Plan: EKG 12-Lead Unable to tolerate BiPAP  CAD:  seen on CT scan 2014 This time for ischemic work-up Suspect shortness of breath is predominantly pulmonary in nature  Adjustment disorder Wife with ankle fracture, now in nursing facility, full-time care, may not come home again, he is very upset Discussed in detail Will need support   Total encounter time more than 45 minutes  Greater than 50% was spent in counseling and coordination of care with the patient  Disposition:   F/U  6 months   Orders Placed This Encounter  Procedures  . EKG 12-Lead     Signed, Esmond Plants, M.D., Ph.D. 10/20/2019  [place], [place]

## 2019-11-25 ENCOUNTER — Telehealth: Payer: Self-pay | Admitting: Cardiovascular Disease

## 2019-11-25 ENCOUNTER — Other Ambulatory Visit
Admission: RE | Admit: 2019-11-25 | Discharge: 2019-11-25 | Disposition: A | Discharge status: Home / Self Care | Payer: Medicare PPO | Source: Ambulatory Visit | Attending: Cardiovascular Disease | Admitting: Cardiovascular Disease

## 2019-11-25 DIAGNOSIS — I5032 Chronic diastolic (congestive) heart failure: Secondary | ICD-10-CM | POA: Diagnosis present

## 2019-11-25 LAB — BASIC METABOLIC PANEL
Anion gap: 9 (ref 5–15)
BUN: 29 mg/dL — ABNORMAL HIGH (ref 8–23)
CO2: 26 mmol/L (ref 22–32)
Calcium: 9.1 mg/dL (ref 8.9–10.3)
Chloride: 95 mmol/L — ABNORMAL LOW (ref 98–111)
Creatinine, Ser: 1.78 mg/dL — ABNORMAL HIGH (ref 0.61–1.24)
GFR calc Af Amer: 38 mL/min — ABNORMAL LOW (ref >60)
GFR calc non Af Amer: 33 mL/min — ABNORMAL LOW (ref >60)
Glucose, Bld: 108 mg/dL — ABNORMAL HIGH (ref 70–99)
Potassium: 3.9 mmol/L (ref 3.5–5.1)
Sodium: 130 mmol/L — ABNORMAL LOW (ref 135–145)

## 2019-11-25 NOTE — Telephone Encounter (Signed)
Patient calling to get lab slip for bmp today.  No active order in epic but avs says bmp in April    Please call patient when ready and he can go to [facility] . He wants to go this morning.

## 2019-11-25 NOTE — Telephone Encounter (Signed)
Spoke with patient and reviewed that order has now been entered and he can go to [facility] to have that done when convenient. He was appreciative for the call with no further questions at this time.

## 2019-11-29 ENCOUNTER — Telehealth: Payer: Self-pay | Admitting: *Deleted

## 2019-11-29 NOTE — Telephone Encounter (Signed)
Spoke with patient and reviewed results, questions, and recommendations. Patient states that he is taking furosemide 20 mg 1 tablet Twice A Week with Potassium 20 mEq Twice A Week as well. He reports that according to pulmonary physician he has some kidney failure and anemia and this is the first time he has ever heard of this. Reviewed Dr. Donivan Scull review of comparison to previous years and he stated that his Pulmonary provider was going to reach out to Dr. Lovie Macadamia for further discussion about the kidney and anemia. Let him know that I would update Dr. Rockey Situ on his dosage of medication and frequency and that if he should have any further recommendations then I would give him a call back.

## 2019-11-29 NOTE — Telephone Encounter (Signed)
-----   Message from Minna Merritts, MD sent at 11/28/2019 11:16 AM EDT ----- Renal function is mildly worse compared to December 2019 Numbers are about the same as late 2020 Can we confirm how much Lasix he is taking, can he give [place] an update on his leg swelling Need to keep his weight down Blood count was better November 2020 which should help some of the leg swelling

## 2020-01-26 ENCOUNTER — Other Ambulatory Visit: Payer: Self-pay | Admitting: Student

## 2020-01-26 DIAGNOSIS — N1832 Chronic kidney disease, stage 3b: Secondary | ICD-10-CM

## 2020-03-01 ENCOUNTER — Emergency Department: Payer: Medicare PPO

## 2020-03-01 ENCOUNTER — Other Ambulatory Visit: Payer: Self-pay

## 2020-03-01 ENCOUNTER — Encounter: Payer: Self-pay | Admitting: Emergency Medicine

## 2020-03-01 ENCOUNTER — Inpatient Hospital Stay
Admission: EM | Admit: 2020-03-01 | Discharge: 2020-03-04 | DRG: 391 | Disposition: A | Discharge status: Home Health | Payer: Medicare PPO | Attending: Internal Medicine | Admitting: Internal Medicine

## 2020-03-01 DIAGNOSIS — I5033 Acute on chronic diastolic (congestive) heart failure: Secondary | ICD-10-CM

## 2020-03-01 DIAGNOSIS — Z825 Family history of asthma and other chronic lower respiratory diseases: Secondary | ICD-10-CM

## 2020-03-01 DIAGNOSIS — J9621 Acute and chronic respiratory failure with hypoxia: Secondary | ICD-10-CM | POA: Diagnosis present

## 2020-03-01 DIAGNOSIS — Z7982 Long term (current) use of aspirin: Secondary | ICD-10-CM

## 2020-03-01 DIAGNOSIS — Z8249 Family history of ischemic heart disease and other diseases of the circulatory system: Secondary | ICD-10-CM | POA: Diagnosis not present

## 2020-03-01 DIAGNOSIS — Z8673 Personal history of transient ischemic attack (TIA), and cerebral infarction without residual deficits: Secondary | ICD-10-CM

## 2020-03-01 DIAGNOSIS — Z885 Allergy status to narcotic agent status: Secondary | ICD-10-CM | POA: Diagnosis not present

## 2020-03-01 DIAGNOSIS — Z8546 Personal history of malignant neoplasm of prostate: Secondary | ICD-10-CM

## 2020-03-01 DIAGNOSIS — I5032 Chronic diastolic (congestive) heart failure: Secondary | ICD-10-CM | POA: Diagnosis present

## 2020-03-01 DIAGNOSIS — F329 Major depressive disorder, single episode, unspecified: Secondary | ICD-10-CM | POA: Diagnosis present

## 2020-03-01 DIAGNOSIS — K5792 Diverticulitis of intestine, part unspecified, without perforation or abscess without bleeding: Secondary | ICD-10-CM | POA: Diagnosis present

## 2020-03-01 DIAGNOSIS — Z91030 Bee allergy status: Secondary | ICD-10-CM

## 2020-03-01 DIAGNOSIS — D631 Anemia in chronic kidney disease: Secondary | ICD-10-CM | POA: Diagnosis not present

## 2020-03-01 DIAGNOSIS — J449 Chronic obstructive pulmonary disease, unspecified: Secondary | ICD-10-CM | POA: Diagnosis present

## 2020-03-01 DIAGNOSIS — I130 Hypertensive heart and chronic kidney disease with heart failure and stage 1 through stage 4 chronic kidney disease, or unspecified chronic kidney disease: Secondary | ICD-10-CM | POA: Diagnosis present

## 2020-03-01 DIAGNOSIS — Z823 Family history of stroke: Secondary | ICD-10-CM | POA: Diagnosis not present

## 2020-03-01 DIAGNOSIS — Z880 Allergy status to penicillin: Secondary | ICD-10-CM

## 2020-03-01 DIAGNOSIS — Z9119 Patient's noncompliance with other medical treatment and regimen: Secondary | ICD-10-CM

## 2020-03-01 DIAGNOSIS — R0902 Hypoxemia: Secondary | ICD-10-CM

## 2020-03-01 DIAGNOSIS — Z66 Do not resuscitate: Secondary | ICD-10-CM | POA: Diagnosis present

## 2020-03-01 DIAGNOSIS — Z20822 Contact with and (suspected) exposure to covid-19: Secondary | ICD-10-CM | POA: Diagnosis present

## 2020-03-01 DIAGNOSIS — N189 Chronic kidney disease, unspecified: Secondary | ICD-10-CM | POA: Diagnosis not present

## 2020-03-01 DIAGNOSIS — K5712 Diverticulitis of small intestine without perforation or abscess without bleeding: Secondary | ICD-10-CM | POA: Diagnosis present

## 2020-03-01 DIAGNOSIS — Z87891 Personal history of nicotine dependence: Secondary | ICD-10-CM | POA: Diagnosis not present

## 2020-03-01 DIAGNOSIS — R109 Unspecified abdominal pain: Secondary | ICD-10-CM | POA: Diagnosis not present

## 2020-03-01 DIAGNOSIS — I10 Essential (primary) hypertension: Secondary | ICD-10-CM | POA: Diagnosis not present

## 2020-03-01 DIAGNOSIS — Z79899 Other long term (current) drug therapy: Secondary | ICD-10-CM

## 2020-03-01 DIAGNOSIS — R21 Rash and other nonspecific skin eruption: Secondary | ICD-10-CM | POA: Diagnosis not present

## 2020-03-01 DIAGNOSIS — N1832 Chronic kidney disease, stage 3b: Secondary | ICD-10-CM | POA: Diagnosis present

## 2020-03-01 DIAGNOSIS — A419 Sepsis, unspecified organism: Secondary | ICD-10-CM

## 2020-03-01 DIAGNOSIS — K649 Unspecified hemorrhoids: Secondary | ICD-10-CM | POA: Diagnosis not present

## 2020-03-01 DIAGNOSIS — N179 Acute kidney failure, unspecified: Secondary | ICD-10-CM | POA: Diagnosis not present

## 2020-03-01 DIAGNOSIS — G4730 Sleep apnea, unspecified: Secondary | ICD-10-CM | POA: Diagnosis present

## 2020-03-01 DIAGNOSIS — R531 Weakness: Secondary | ICD-10-CM | POA: Diagnosis not present

## 2020-03-01 LAB — HEPATIC FUNCTION PANEL
ALT: 12 U/L (ref 0–44)
AST: 17 U/L (ref 15–41)
Albumin: 4 g/dL (ref 3.5–5.0)
Alkaline Phosphatase: 60 U/L (ref 38–126)
Bilirubin, Direct: 0.3 mg/dL — ABNORMAL HIGH (ref 0.0–0.2)
Indirect Bilirubin: 1.1 mg/dL — ABNORMAL HIGH (ref 0.3–0.9)
Total Bilirubin: 1.4 mg/dL — ABNORMAL HIGH (ref 0.3–1.2)
Total Protein: 6.4 g/dL — ABNORMAL LOW (ref 6.5–8.1)

## 2020-03-01 LAB — BASIC METABOLIC PANEL
Anion gap: 10 (ref 5–15)
BUN: 34 mg/dL — ABNORMAL HIGH (ref 8–23)
CO2: 21 mmol/L — ABNORMAL LOW (ref 22–32)
Calcium: 9 mg/dL (ref 8.9–10.3)
Chloride: 107 mmol/L (ref 98–111)
Creatinine, Ser: 1.75 mg/dL — ABNORMAL HIGH (ref 0.61–1.24)
GFR calc Af Amer: 38 mL/min — ABNORMAL LOW (ref >60)
GFR calc non Af Amer: 33 mL/min — ABNORMAL LOW (ref >60)
Glucose, Bld: 147 mg/dL — ABNORMAL HIGH (ref 70–99)
Potassium: 3.7 mmol/L (ref 3.5–5.1)
Sodium: 138 mmol/L (ref 135–145)

## 2020-03-01 LAB — SARS CORONAVIRUS 2 BY RT PCR (HOSPITAL ORDER, PERFORMED IN ~~LOC~~ HOSPITAL LAB): SARS Coronavirus 2: NEGATIVE

## 2020-03-01 LAB — CBC
HCT: 35.3 % — ABNORMAL LOW (ref 39.0–52.0)
Hemoglobin: 12.5 g/dL — ABNORMAL LOW (ref 13.0–17.0)
MCH: 32.1 pg (ref 26.0–34.0)
MCHC: 35.4 g/dL (ref 30.0–36.0)
MCV: 90.5 fL (ref 80.0–100.0)
Platelets: 139 10*3/uL — ABNORMAL LOW (ref 150–400)
RBC: 3.9 MIL/uL — ABNORMAL LOW (ref 4.22–5.81)
RDW: 14.2 % (ref 11.5–15.5)
WBC: 15.8 10*3/uL — ABNORMAL HIGH (ref 4.0–10.5)
nRBC: 0 % (ref 0.0–0.2)

## 2020-03-01 LAB — TROPONIN I (HIGH SENSITIVITY): Troponin I (High Sensitivity): 19 ng/L — ABNORMAL HIGH (ref <18)

## 2020-03-01 LAB — PROTIME-INR
INR: 1.1 (ref 0.8–1.2)
Prothrombin Time: 14.1 seconds (ref 11.4–15.2)

## 2020-03-01 LAB — LIPASE, BLOOD: Lipase: 36 U/L (ref 11–51)

## 2020-03-01 LAB — BRAIN NATRIURETIC PEPTIDE: B Natriuretic Peptide: 331.9 pg/mL — ABNORMAL HIGH (ref 0.0–100.0)

## 2020-03-01 LAB — LACTIC ACID, PLASMA: Lactic Acid, Venous: 1.3 mmol/L (ref 0.5–1.9)

## 2020-03-01 MED ORDER — FUROSEMIDE 40 MG PO TABS: 20.0000 mg | ORAL_TABLET | Freq: Every day | ORAL | Status: DC | PRN

## 2020-03-01 MED ORDER — ACETAMINOPHEN 650 MG RE SUPP: 650.0000 mg | Freq: Four times a day (QID) | RECTAL | Status: DC | PRN

## 2020-03-01 MED ORDER — ONDANSETRON HCL 4 MG PO TABS
4.0000 mg | ORAL_TABLET | Freq: Four times a day (QID) | ORAL | Status: DC | PRN
  Administered 2020-03-03: 09:00:00 4 mg via ORAL
  Filled 2020-03-01: qty 1

## 2020-03-01 MED ORDER — ONDANSETRON HCL 4 MG/2ML IJ SOLN: 4.0000 mg | Freq: Four times a day (QID) | INTRAMUSCULAR | Status: DC | PRN

## 2020-03-01 MED ORDER — ONDANSETRON HCL 4 MG/2ML IJ SOLN
4.0000 mg | Freq: Once | INTRAMUSCULAR | Status: AC
  Administered 2020-03-01: 4 mg via INTRAVENOUS
  Filled 2020-03-01: qty 2

## 2020-03-01 MED ORDER — ESCITALOPRAM OXALATE 10 MG PO TABS
10.0000 mg | ORAL_TABLET | Freq: Every day | ORAL | Status: DC
  Administered 2020-03-02 – 2020-03-04 (×3): 10 mg via ORAL
  Filled 2020-03-01 (×3): qty 1

## 2020-03-01 MED ORDER — LOSARTAN POTASSIUM-HCTZ 100-25 MG PO TABS: 1.0000 | ORAL_TABLET | Freq: Every day | ORAL | Status: DC

## 2020-03-01 MED ORDER — ENOXAPARIN SODIUM 40 MG/0.4ML ~~LOC~~ SOLN
40.0000 mg | SUBCUTANEOUS | Status: DC
  Administered 2020-03-02: 40 mg via SUBCUTANEOUS
  Filled 2020-03-01: qty 0.4

## 2020-03-01 MED ORDER — POTASSIUM CHLORIDE CRYS ER 20 MEQ PO TBCR: 20.0000 meq | EXTENDED_RELEASE_TABLET | Freq: Every day | ORAL | Status: DC | PRN

## 2020-03-01 MED ORDER — TRAZODONE HCL 50 MG PO TABS: 25.0000 mg | ORAL_TABLET | Freq: Every evening | ORAL | Status: DC | PRN

## 2020-03-01 MED ORDER — SODIUM CHLORIDE 0.9 % IV SOLN
2.0000 g | Freq: Once | INTRAVENOUS | Status: AC
  Administered 2020-03-01: 2 g via INTRAVENOUS
  Filled 2020-03-01: qty 2

## 2020-03-01 MED ORDER — TRAZODONE HCL 50 MG PO TABS
150.0000 mg | ORAL_TABLET | Freq: Every day | ORAL | Status: DC
  Administered 2020-03-02 – 2020-03-03 (×3): 150 mg via ORAL
  Filled 2020-03-01: qty 3
  Filled 2020-03-01: qty 1
  Filled 2020-03-01: qty 3
  Filled 2020-03-01: qty 1

## 2020-03-01 MED ORDER — MORPHINE SULFATE (PF) 2 MG/ML IV SOLN: 2.0000 mg | INTRAVENOUS | Status: DC | PRN

## 2020-03-01 MED ORDER — SODIUM CHLORIDE 0.9 % IV SOLN
1.0000 g | Freq: Three times a day (TID) | INTRAVENOUS | Status: DC
  Administered 2020-03-02 – 2020-03-04 (×8): 1 g via INTRAVENOUS
  Filled 2020-03-01 (×12): qty 1

## 2020-03-01 MED ORDER — SODIUM CHLORIDE 0.9 % IV SOLN: INTRAVENOUS | Status: DC

## 2020-03-01 MED ORDER — ACETAMINOPHEN 325 MG PO TABS: 650.0000 mg | ORAL_TABLET | Freq: Four times a day (QID) | ORAL | Status: DC | PRN

## 2020-03-01 MED ORDER — VITAMIN D 25 MCG (1000 UNIT) PO TABS
1000.0000 [IU] | ORAL_TABLET | Freq: Every day | ORAL | Status: DC
  Administered 2020-03-02 – 2020-03-04 (×3): 1000 [IU] via ORAL
  Filled 2020-03-01 (×3): qty 1

## 2020-03-01 MED ORDER — METRONIDAZOLE IN NACL 5-0.79 MG/ML-% IV SOLN
500.0000 mg | Freq: Once | INTRAVENOUS | Status: AC
  Administered 2020-03-01: 500 mg via INTRAVENOUS
  Filled 2020-03-01: qty 100

## 2020-03-01 NOTE — ED Notes (Signed)
Pt st on 2 L [place] at night.

## 2020-03-01 NOTE — ED Triage Notes (Signed)
Pt from [facility] residency via [place]. Per EMS, pt walking to the bathroom to vomit, felt weak and drop himself to the bathroom, pt found on the floor. Per pt he did not fall or hit his head.  Pt  able to recall event.  Pt st N/v/D since yesterday.  Pt hypoxic 89% RA placed on 2L [place] resulting on 96%.  Pt N/V upon arrival.

## 2020-03-01 NOTE — Progress Notes (Signed)
MEDICATION RELATED CONSULT NOTE - INITIAL   Pharmacy Consult for Aztreonam/ PCN allergy /Cefepime  Indication:   PCN allergy   Allergies  Allergen Reactions  . Penicillins Rash, Other (See Comments) and Hives    Peeling around ankles, ?cough  . Darvon [Propoxyphene] Nausea Only and Rash    Patient Measurements: Height: 5\' 10"  (177.8 cm) Weight: 95.3 kg (210 lb) IBW/kg (Calculated) : 73 Adjusted Body Weight:   Vital Signs: Temp: 98.7 F (37.1 C) (07/14 1858) Temp Source: Oral (07/14 1858) BP: 164/52 (07/14 1910) Pulse Rate: 79 (07/14 1910) Intake/Output from previous day: No intake/output data recorded. Intake/Output from this shift: No intake/output data recorded.  Labs: Recent Labs    03/01/20 1904 03/01/20 1907  WBC 15.8*  --   HGB 12.5*  --   HCT 35.3*  --   PLT 139*  --   CREATININE 1.75*  --   ALBUMIN  --  4.0  PROT  --  6.4*  AST  --  17  ALT  --  12  ALKPHOS  --  60  BILITOT  --  1.4*  BILIDIR  --  0.3*  IBILI  --  1.1*   Estimated Creatinine Clearance: 31.2 mL/min (A) (by C-G formula based on SCr of 1.75 mg/dL (H)).   Microbiology: No results found for this or any previous visit (from the past 720 hour(s)).  Medical History: Past Medical History:  Diagnosis Date  . COPD (chronic obstructive pulmonary disease) ([place])   . Hypertension   . Pneumonia    [id number] hospitalized  . Prostate cancer ([place])    with seed placement  . Sleep apnea    noncompliant, dx 1996  . Stroke [facility]) 2012   no residuals    Medications:  (Not in a hospital admission)   Assessment: Pharmacy consulted to investigate PCN allergy , change from Aztreonam to cefepime .    Goal of Therapy:    Plan:  No indication that pt can tolerate cephalosporins ,  Will continue with aztreonam.   Sahaana Weitman D 03/01/2020,8:54 PM

## 2020-03-01 NOTE — ED Notes (Signed)
Pt placed on 3L [place], pt hypoxic at 88% on 2L [place].  Pt sating at 94% on 3L via [place].

## 2020-03-01 NOTE — ED Provider Notes (Signed)
[facility] Emergency Department Provider Note   ____________________________________________   First MD Initiated Contact with Patient 03/01/20 1904     (approximate)  I have reviewed the triage vital signs and the nursing notes.   HISTORY  Chief Complaint Weakness    HPI Trevor Ferguson is a 86 y.o. male history of COPD, prostate cancer and congestive heart failure  Patient reports that for last month he had intermittent diarrhea and abdominal pain, tonight pain seem to be getting worse, causing him to have to vomit twice nonbloody.  Pain is throughout his abdomen.  He has been on antibiotic for 3 days for possible E. coli in his stool but symptoms seem to be worsening this evening.  No fever.  Feels fatigued and generally weak.  Some slight shortness of breath and slight leg swelling over the last several days as well, use oxygen in the evening but reports he probably should be using it during the day as well  No chest pain.  Previous history of tonsillectomy hemorrhoid surgery   Past Medical History:  Diagnosis Date  . COPD (chronic obstructive pulmonary disease) ([place])   . Hypertension   . Pneumonia    [id number] hospitalized  . Prostate cancer ([place])    with seed placement  . Sleep apnea    noncompliant, dx 1996  . Stroke [facility]) 2012   no residuals    Patient Active Problem List   Diagnosis Date Noted  . Pneumonia 07/17/2018  . Leg swelling 12/18/2015  . Chronic diastolic CHF (congestive heart failure) ([place]) 07/18/2015  . Essential hypertension 07/18/2015  . COPD (chronic obstructive pulmonary disease) ([place]) 03/28/2015  . Sleep apnea 03/28/2015  . SOB (shortness of breath) 03/28/2015    Past Surgical History:  Procedure Laterality Date  . adenoid    . APPENDECTOMY    . HEMORRHOID SURGERY    . spine cyst    . TONSILLECTOMY      Prior to Admission medications   Medication Sig Start Date End Date Taking? Authorizing Provider    aspirin EC 81 MG tablet Take 1 tablet (81 mg total) by mouth daily. 09/20/19   Minna Merritts, MD  carvedilol (COREG) 3.125 MG tablet Take 3.125 mg by mouth in the morning and at bedtime. 10/15/19 10/14/20  [provider]  Cholecalciferol (VITAMIN D-3) 25 MCG (1000 UT) CAPS Take 1,000 Units by mouth daily.     [provider]  escitalopram (LEXAPRO) 5 MG tablet Take 5 mg by mouth daily.    [provider]  fluticasone furoate-vilanterol (BREO ELLIPTA) 100-25 MCG/INH AEPB Inhale 1 puff into the lungs daily. 07/23/17   Flora Lipps, MD  furosemide (LASIX) 20 MG tablet Take 1 tablet (20 mg total) by mouth daily as needed. Use sparingly. Take potassium when you take furosemide. 08/11/18 10/19/28  Minna Merritts, MD  losartan-hydrochlorothiazide (HYZAAR) 100-25 MG per tablet Take 1 tablet by mouth daily.    [provider]  Multiple Vitamins-Minerals (CENTRAL-VITE PO) Take 1 tablet by mouth daily.    [provider]  potassium chloride SA (K-DUR,KLOR-CON) 20 MEQ tablet Take 1 tablet (20 mEq total) by mouth daily as needed. Take when you take the furosemide. 08/11/18   Minna Merritts, MD  PROAIR HFA 108 [phone] Base) MCG/ACT inhaler INHALE 2 INHALATIONS INTO THE LUNGS Q 6 H PRF WHEEZING OR SOB OR COUGH 09/01/18   [provider]  SPIRIVA RESPIMAT 2.5 MCG/ACT AERS INL 2 INHALATIONS ITL QD  09/01/18   [provider]    Allergies Penicillins and Darvon [propoxyphene]  Family History  Problem Relation Age of Onset  . Congestive Heart Failure Mother   . COPD Sister   . Asthma Sister   . Tuberculosis Maternal Grandmother   . Stroke Maternal Grandfather     Social History Social History   Tobacco Use  . Smoking status: Former Smoker    Years: 40.00    Types: Cigarettes    Quit date: 08/19/1985    Years since quitting: 34.5  . Smokeless tobacco: Never Used  Vaping Use  . Vaping Use: Never used  Substance Use Topics  . Alcohol use:  Yes    Alcohol/week: 14.0 standard drinks    Types: 14 Shots of liquor per week  . Drug use: No    Review of Systems Constitutional: No fever/chills but feels fatigued Eyes: No visual changes. ENT: No sore throat. Cardiovascular: Denies chest pain. Respiratory: Shortness of breath developing the last several days gastrointestinal: See HPI Genitourinary: Negative for dysuria. Musculoskeletal: Negative for back pain.  Slight swelling in both lower legs normal use compression stockings for same Skin: Negative for rash. Neurological: Negative for headaches, areas of focal weakness or numbness.    ____________________________________________   PHYSICAL EXAM:  VITAL SIGNS: ED Triage Vitals  Enc Vitals Group     BP 03/01/20 1910 (!) 164/52     Pulse Rate 03/01/20 1858 83     Resp 03/01/20 1858 19     Temp 03/01/20 1858 98.7 F (37.1 C)     Temp Source 03/01/20 1858 Oral     SpO2 03/01/20 1858 (!) 86 %     Weight 03/01/20 1901 210 lb (95.3 kg)     Height 03/01/20 1901 5\' 10"  (1.778 m)     Head Circumference --      Peak Flow --      Pain Score --      Pain Loc --      Pain Edu? --      Excl. in [place]? --     Constitutional: Alert and oriented.  Mildly ill-appearing but in no acute distress.  Very pleasant Eyes: Conjunctivae are normal. Head: Atraumatic. Nose: No congestion/rhinnorhea. Mouth/Throat: Mucous membranes are moist. Neck: No stridor.  Cardiovascular: Normal rate, regular rhythm. Grossly normal heart sounds.  Good peripheral circulation. Respiratory: Very mild tachypnea, placed on 2 L nasal cannula and reports improvement. Is resting comfortably with clear lung sounds after being placed on oxygen and improvement in work of breathing. Gastrointestinal: Soft and moderate tenderness throughout, slightly more in the lower quadrants.  No rebound or guarding. Musculoskeletal: No lower extremity tenderness with 1+ lower extremity edema in lower extremities  bilateral. Neurologic:  Normal speech and language. No gross focal neurologic deficits are appreciated.  Skin:  Skin is warm, dry and intact. No rash noted. Psychiatric: Mood and affect are normal. Speech and behavior are normal.  ____________________________________________   LABS (all labs ordered are listed, but only abnormal results are displayed)  Labs Reviewed  BASIC METABOLIC PANEL - Abnormal; Notable for the following components:      Result Value   CO2 21 (*)    Glucose, Bld 147 (*)    BUN 34 (*)    Creatinine, Ser 1.75 (*)    GFR calc non Af Amer 33 (*)    GFR calc Af Amer 38 (*)    All other components within normal limits  CBC - Abnormal; Notable for  the following components:   WBC 15.8 (*)    RBC 3.90 (*)    Hemoglobin 12.5 (*)    HCT 35.3 (*)    Platelets 139 (*)    All other components within normal limits  HEPATIC FUNCTION PANEL - Abnormal; Notable for the following components:   Total Protein 6.4 (*)    Total Bilirubin 1.4 (*)    Bilirubin, Direct 0.3 (*)    Indirect Bilirubin 1.1 (*)    All other components within normal limits  BRAIN NATRIURETIC PEPTIDE - Abnormal; Notable for the following components:   B Natriuretic Peptide 331.9 (*)    All other components within normal limits  TROPONIN I (HIGH SENSITIVITY) - Abnormal; Notable for the following components:   Troponin I (High Sensitivity) 19 (*)    All other components within normal limits  SARS CORONAVIRUS 2 BY RT PCR (HOSPITAL ORDER, [place] [facility])  CULTURE, BLOOD (ROUTINE X 2)  CULTURE, BLOOD (ROUTINE X 2)  LIPASE, BLOOD  PROTIME-INR  URINALYSIS, COMPLETE (UACMP) WITH MICROSCOPIC  LACTIC ACID, PLASMA  CBG MONITORING, ED   ____________________________________________  EKG  Reviewed entered by me at 1905 Heart rate 89 QRS 99 QTc 430 Normal sinus rhythm occasional PAC.  No evidence of acute ischemia  denoted. ____________________________________________  RADIOLOGY  CT ABDOMEN PELVIS WO CONTRAST  Result Date: 03/01/2020 CLINICAL DATA:  Nausea and vomiting, weakness, found down EXAM: CT ABDOMEN AND PELVIS WITHOUT CONTRAST TECHNIQUE: Multidetector CT imaging of the abdomen and pelvis was performed following the standard protocol without IV contrast. COMPARISON:  None. FINDINGS: Lower chest: There is bibasilar scarring and fibrosis. No acute pleural or parenchymal lung disease. Hepatobiliary: No focal liver abnormality is seen. No gallstones, gallbladder wall thickening, or biliary dilatation. Pancreas: Unremarkable. No pancreatic ductal dilatation or surrounding inflammatory changes. Spleen: Normal in size without focal abnormality. Adrenals/Urinary Tract: Calcifications of the bilateral renal hila most consistent with vascular calcifications. No definite urinary tract calculi. No obstructive uropathy. The bladder is grossly normal. The adrenals are unremarkable. Stomach/Bowel: There is no bowel obstruction or ileus. Inflammatory changes surround a loop of small bowel within the central lower abdomen, reference axial image 56 through 61 of series 2, and image 21-35 of series 6. Overall, the appearance is most consistent with diverticulitis of the small bowel. No fluid collection or abscess. The appendix is surgically absent. Vascular/Lymphatic: Aortic atherosclerosis. No enlarged abdominal or pelvic lymph nodes. Reproductive: Radiotherapy seeds are seen within the prostate bed. Other: No free fluid or free intraperitoneal gas. No abdominal wall hernia. Musculoskeletal: No acute or destructive bony lesions. Reconstructed images demonstrate no additional findings. IMPRESSION: 1. Acute diverticulitis of the distal small bowel in the right lower quadrant, involving the distal jejunum or proximal ileum. No perforation, fluid collection, or abscess. 2.  Aortic Atherosclerosis (ICD10-I70.0). Electronically Signed    By: Randa Ngo M.D.   On: 03/01/2020 20:20   DG Chest 2 View  Result Date: 03/01/2020 CLINICAL DATA:  Dyspnea, weakness, vomiting EXAM: CHEST - 2 VIEW COMPARISON:  08/10/2018 FINDINGS: The lungs are mildly hyperinflated in keeping with changes of underlying COPD. No confluent pulmonary infiltrate. No pneumothorax or pleural effusion. Interstitial edema noted on prior examination has resolved; the pulmonary vascularity is normal. Cardiac size is within normal limits. Osseous structures are age-appropriate. IMPRESSION: No active cardiopulmonary disease.  COPD. Electronically Signed   By: Fidela Salisbury MD   On: 03/01/2020 20:25     Chest x-ray reviewed negative for acute finding  CT reviewed concerning for acute diverticulitis distal small bowel without evidence of perforation ____________________________________________   PROCEDURES  Procedure(s) performed: None  Procedures  Critical Care performed: Yes, see critical care note(s)   CRITICAL CARE Performed by: Delman Kitten   Total critical care time: 25 minutes  Critical care time was exclusive of separately billable procedures and treating other patients.  Critical care was necessary to treat or prevent imminent or life-threatening deterioration.  Critical care was time spent personally by me on the following activities: development of treatment plan with patient and/or surrogate as well as nursing, discussions with consultants, evaluation of patient's response to treatment, examination of patient, obtaining history from patient or surrogate, ordering and performing treatments and interventions, ordering and review of laboratory studies, ordering and review of radiographic studies, pulse oximetry and re-evaluation of patient's condition.  Patient with associated tachypnea, leukocytosis and evidence of acute intra-abdominal infection including diverticulitis meeting criteria for sepsis.  Patient initiated on broad-spectrum  antibiotic therapy and admitted to the hospital for further care management ____________________________________________   INITIAL IMPRESSION / ASSESSMENT AND PLAN / ED COURSE  Pertinent labs & imaging results that were available during my care of the patient were reviewed by me and considered in my medical decision making (see chart for details).   Differential diagnosis includes but is not limited to, abdominal perforation, aortic dissection, cholecystitis, appendicitis, diverticulitis, colitis, esophagitis/gastritis, kidney stone, pyelonephritis, urinary tract infection, aortic aneurysm. All are considered in decision and treatment plan. Based upon the patient's presentation and risk factors, and given his recent diagnosis of E. coli in a stool sample I am concerned about worsening intra-abdominal infection including possible colitis, diverticulitis, gastroenteritis etc.  We will proceed with obtaining noncontrast CT imaging based on the patient's history of chronic renal disease.  Additionally, appears slightly volume up by exam with very mild O2 requirement which corrects easily with improvement in respirations on 2 L nasal cannula but he does not demonstrate crackles in his chest x-ray does not demonstrate volume overload overtly.  Hesitant to diurese him at this point given concern for concomitant sepsis and intra-abdominal infection.  No acute cardiac symptoms such as chest pain with a reassuring EKG  ----------------------------------------- 8:55 PM on 03/01/2020 -----------------------------------------  Patient feels improved with Zofran, nausea is notably better.  He has no acute complaint at this time, and given concern for diverticulitis with leukocytosis and given the age and risk factors will admit for further care and treatment to hospitalist service.  Discussed case with Dr. Sidney Ace       ____________________________________________   FINAL CLINICAL IMPRESSION(S) / ED  DIAGNOSES  Final diagnoses:  Sepsis, due to unspecified organism, unspecified whether acute organ dysfunction present [facility])  Diverticulitis        Note:  This document was prepared using Dragon voice recognition software and may include unintentional dictation errors       Delman Kitten, MD 03/01/20 2056

## 2020-03-01 NOTE — ED Notes (Signed)
ED Provider [place] at bedside.

## 2020-03-02 ENCOUNTER — Inpatient Hospital Stay
Admit: 2020-03-02 | Discharge: 2020-03-02 | Disposition: A | Discharge status: Home / Self Care | Payer: Medicare PPO | Attending: Family Medicine | Admitting: Family Medicine

## 2020-03-02 DIAGNOSIS — K5792 Diverticulitis of intestine, part unspecified, without perforation or abscess without bleeding: Secondary | ICD-10-CM | POA: Diagnosis not present

## 2020-03-02 LAB — URINALYSIS, COMPLETE (UACMP) WITH MICROSCOPIC
Bacteria, UA: NONE SEEN
Bilirubin Urine: NEGATIVE
Glucose, UA: NEGATIVE mg/dL
Hgb urine dipstick: NEGATIVE
Ketones, ur: NEGATIVE mg/dL
Leukocytes,Ua: NEGATIVE
Nitrite: NEGATIVE
Protein, ur: 30 mg/dL — AB
Specific Gravity, Urine: 1.017 (ref 1.005–1.030)
Squamous Epithelial / HPF: NONE SEEN (ref 0–5)
pH: 5 (ref 5.0–8.0)

## 2020-03-02 LAB — BASIC METABOLIC PANEL
Anion gap: 9 (ref 5–15)
BUN: 39 mg/dL — ABNORMAL HIGH (ref 8–23)
CO2: 24 mmol/L (ref 22–32)
Calcium: 8.3 mg/dL — ABNORMAL LOW (ref 8.9–10.3)
Chloride: 104 mmol/L (ref 98–111)
Creatinine, Ser: 1.95 mg/dL — ABNORMAL HIGH (ref 0.61–1.24)
GFR calc Af Amer: 34 mL/min — ABNORMAL LOW (ref >60)
GFR calc non Af Amer: 29 mL/min — ABNORMAL LOW (ref >60)
Glucose, Bld: 127 mg/dL — ABNORMAL HIGH (ref 70–99)
Potassium: 3.7 mmol/L (ref 3.5–5.1)
Sodium: 137 mmol/L (ref 135–145)

## 2020-03-02 LAB — CBC
HCT: 31.2 % — ABNORMAL LOW (ref 39.0–52.0)
Hemoglobin: 11.1 g/dL — ABNORMAL LOW (ref 13.0–17.0)
MCH: 32.1 pg (ref 26.0–34.0)
MCHC: 35.6 g/dL (ref 30.0–36.0)
MCV: 90.2 fL (ref 80.0–100.0)
Platelets: 126 10*3/uL — ABNORMAL LOW (ref 150–400)
RBC: 3.46 MIL/uL — ABNORMAL LOW (ref 4.22–5.81)
RDW: 14.6 % (ref 11.5–15.5)
WBC: 20.2 10*3/uL — ABNORMAL HIGH (ref 4.0–10.5)
nRBC: 0 % (ref 0.0–0.2)

## 2020-03-02 LAB — ECHOCARDIOGRAM COMPLETE
Height: 70 in
Weight: 3360 oz

## 2020-03-02 LAB — TSH: TSH: 0.657 u[IU]/mL (ref 0.350–4.500)

## 2020-03-02 MED ORDER — ENOXAPARIN SODIUM 30 MG/0.3ML ~~LOC~~ SOLN
30.0000 mg | SUBCUTANEOUS | Status: DC
  Administered 2020-03-02 – 2020-03-03 (×3): 30 mg via SUBCUTANEOUS
  Filled 2020-03-02 (×3): qty 0.3

## 2020-03-02 MED ORDER — METRONIDAZOLE IN NACL 5-0.79 MG/ML-% IV SOLN
500.0000 mg | Freq: Three times a day (TID) | INTRAVENOUS | Status: DC
  Administered 2020-03-02 – 2020-03-04 (×6): 500 mg via INTRAVENOUS
  Filled 2020-03-02 (×10): qty 100

## 2020-03-02 MED ORDER — FUROSEMIDE 10 MG/ML IJ SOLN
40.0000 mg | Freq: Every day | INTRAMUSCULAR | Status: DC
  Filled 2020-03-02: qty 4

## 2020-03-02 NOTE — H&P (Signed)
[facility] [place] NAME: Trevor Ferguson    MR#:  [id number]  DATE OF BIRTH:  24-Oct-1925  DATE OF ADMISSION:  03/01/2020  PRIMARY CARE PHYSICIAN: Juluis Pitch, MD   REQUESTING/REFERRING PHYSICIAN: Delman Kitten, MD CHIEF COMPLAINT:   Chief Complaint  Patient presents with  . Weakness    HISTORY OF PRESENT ILLNESS:  Trevor Ferguson  is a 86 y.o. Caucasian male with a known history of COPD, hypertension, prostate CA sleep apnea and CVA, presented to the emergency room with acute onset of generalized weakness and fatigue with associated vomiting yesterday with presyncope.  Is been having abdominal discomfort since this morning and has been having loose bowel movements intermittently over the last 6 weeks.  No melena or bright red bleeding per rectum.  He denied any fever or chills.  He had stool culture that grew E. coli and was given p.o. Cipro which she has been taking over the last 5 days.  Is usually on oxygen at night for his COPD.  He admitted to occasional paroxysmal nocturnal dyspnea as well as occasional orthopnea with 2 pillows which has been increasing over the last 3 weeks.  He has lower extremity edema which has not been worsening per his report.  No fever or chills.  No chest pain or palpitations.  Upon presentation to the emergency room, blood pressure was 146/52 and pulse ox was 86% on room air and 92 to 95% on 2 L of O2 by nasal cannula with otherwise normal vital signs.  He was later on slightly tachypneic though with respiratory rate of 25 and his blood pressure was up to 164/52.  Labs revealed a BUN of 34 and creatinine 1.75 close to baseline with stage IIIb chronic kidney disease and cooperative 6.4 with albumin of 4, direct bili of 0.3 with total bili of 1.4.  BNP was 331.9 and high-sensitivity troponin I was 19.  Lactic acid was 1.5 and CBC showed leukocytosis of 15.8 with mild anemia.  The patient was given IV aztreonam and IV Flagyl as well  as 4 mg of IV Zofran.  He will be admitted to a medically monitored bed for further evaluation and management. PAST MEDICAL HISTORY:   Past Medical History:  Diagnosis Date  . COPD (chronic obstructive pulmonary disease) ([place])   . Hypertension   . Pneumonia    [id number] hospitalized  . Prostate cancer ([place])    with seed placement  . Sleep apnea    noncompliant, dx 1996  . Stroke [facility] 2012   no residuals    PAST SURGICAL HISTORY:   Past Surgical History:  Procedure Laterality Date  . adenoid    . APPENDECTOMY    . HEMORRHOID SURGERY    . spine cyst    . TONSILLECTOMY      SOCIAL HISTORY:   Social History   Tobacco Use  . Smoking status: Former Smoker    Years: 40.00    Types: Cigarettes    Quit date: 08/19/1985    Years since quitting: 34.5  . Smokeless tobacco: Never Used  Substance Use Topics  . Alcohol use: Yes    Alcohol/week: 14.0 standard drinks    Types: 14 Shots of liquor per week    FAMILY HISTORY:   Family History  Problem Relation Age of Onset  . Congestive Heart Failure Mother   . COPD Sister   . Asthma Sister   . Tuberculosis Maternal Grandmother   . Stroke  Maternal Grandfather     DRUG ALLERGIES:   Allergies  Allergen Reactions  . Penicillins Rash, Other (See Comments) and Hives    Peeling around ankles, ?cough  . Bee Venom Palpitations    INSECT BITES/STINGS  . Darvon [Propoxyphene] Nausea Only and Rash    REVIEW OF SYSTEMS:   ROS As per history of present illness. All pertinent systems were reviewed above. Constitutional,  HEENT, cardiovascular, respiratory, GI, GU, musculoskeletal, neuro, psychiatric, endocrine,  integumentary and hematologic systems were reviewed and are otherwise  negative/unremarkable except for positive findings mentioned above in the HPI.   MEDICATIONS AT HOME:   Prior to Admission medications   Medication Sig Start Date End Date Taking? Authorizing Provider  aspirin EC 81 MG tablet Take 1 tablet (81  mg total) by mouth daily. 09/20/19  Yes Gollan, Kathlene November, MD  Cholecalciferol (VITAMIN D-3) 25 MCG (1000 UT) CAPS Take 1,000 Units by mouth daily.    Yes [provider]  escitalopram (LEXAPRO) 10 MG tablet Take 10 mg by mouth daily.    Yes [provider]  furosemide (LASIX) 20 MG tablet Take 1 tablet (20 mg total) by mouth daily as needed. Use sparingly. Take potassium when you take furosemide. 08/11/18 10/19/28 Yes Gollan, Kathlene November, MD  losartan-hydrochlorothiazide (HYZAAR) 100-25 MG per tablet Take 1 tablet by mouth daily.   Yes [provider]  potassium chloride SA (K-DUR,KLOR-CON) 20 MEQ tablet Take 1 tablet (20 mEq total) by mouth daily as needed. Take when you take the furosemide. 08/11/18  Yes Minna Merritts, MD  traZODone (DESYREL) 150 MG tablet Take 150 mg by mouth at bedtime.   Yes [provider]  triamcinolone (KENALOG) 0.025 % cream Apply topically 2 (two) times daily. 02/01/20  Yes [provider]      VITAL SIGNS:  Blood pressure (!) 164/52, pulse 79, temperature 98.7 F (37.1 C), temperature source Oral, resp. rate (!) 25, height 5\' 10"  (1.778 m), weight 95.3 kg, SpO2 92 %.  PHYSICAL EXAMINATION:  Physical Exam  GENERAL:  86 y.o.-year-old Caucasian male patient lying in the bed with no acute distress.  EYES: Pupils equal, round, reactive to light and accommodation. No scleral icterus. Extraocular muscles intact.  HEENT: Head atraumatic, normocephalic. Oropharynx and nasopharynx clear.  NECK:  Supple, no jugular venous distention. No thyroid enlargement, no tenderness.  LUNGS: Normal breath sounds bilaterally, no wheezing, rales,rhonchi or crepitation. No use of accessory muscles of respiration.  CARDIOVASCULAR: Regular rate and rhythm, S1, S2 normal. No murmurs, rubs, or gallops.  ABDOMEN: Soft, nondistended, with lower abdominal tenderness mainly in the left lower quadrant without rebound tenderness guarding or rigidity.  Bowel  sounds present. No organomegaly or mass.  EXTREMITIES: No pedal edema, cyanosis, or clubbing.  NEUROLOGIC: Cranial nerves II through XII are intact. Muscle strength 5/5 in all extremities. Sensation intact. Gait not checked.  PSYCHIATRIC: The patient is alert and oriented x 3.  Normal affect and good eye contact. SKIN: No obvious rash, lesion, or ulcer.   LABORATORY PANEL:   CBC Recent Labs  Lab 03/01/20 1904  WBC 15.8*  HGB 12.5*  HCT 35.3*  PLT 139*   ------------------------------------------------------------------------------------------------------------------  Chemistries  Recent Labs  Lab 03/01/20 1904 03/01/20 1907  NA 138  --   K 3.7  --   CL 107  --   CO2 21*  --   GLUCOSE 147*  --   BUN 34*  --   CREATININE 1.75*  --   CALCIUM  9.0  --   AST  --  17  ALT  --  12  ALKPHOS  --  60  BILITOT  --  1.4*   ------------------------------------------------------------------------------------------------------------------  Cardiac Enzymes No results for input(s): TROPONINI in the last 168 hours. ------------------------------------------------------------------------------------------------------------------  RADIOLOGY:  CT ABDOMEN PELVIS WO CONTRAST  Result Date: 03/01/2020 CLINICAL DATA:  Nausea and vomiting, weakness, found down EXAM: CT ABDOMEN AND PELVIS WITHOUT CONTRAST TECHNIQUE: Multidetector CT imaging of the abdomen and pelvis was performed following the standard protocol without IV contrast. COMPARISON:  None. FINDINGS: Lower chest: There is bibasilar scarring and fibrosis. No acute pleural or parenchymal lung disease. Hepatobiliary: No focal liver abnormality is seen. No gallstones, gallbladder wall thickening, or biliary dilatation. Pancreas: Unremarkable. No pancreatic ductal dilatation or surrounding inflammatory changes. Spleen: Normal in size without focal abnormality. Adrenals/Urinary Tract: Calcifications of the bilateral renal hila most consistent  with vascular calcifications. No definite urinary tract calculi. No obstructive uropathy. The bladder is grossly normal. The adrenals are unremarkable. Stomach/Bowel: There is no bowel obstruction or ileus. Inflammatory changes surround a loop of small bowel within the central lower abdomen, reference axial image 56 through 61 of series 2, and image 21-35 of series 6. Overall, the appearance is most consistent with diverticulitis of the small bowel. No fluid collection or abscess. The appendix is surgically absent. Vascular/Lymphatic: Aortic atherosclerosis. No enlarged abdominal or pelvic lymph nodes. Reproductive: Radiotherapy seeds are seen within the prostate bed. Other: No free fluid or free intraperitoneal gas. No abdominal wall hernia. Musculoskeletal: No acute or destructive bony lesions. Reconstructed images demonstrate no additional findings. IMPRESSION: 1. Acute diverticulitis of the distal small bowel in the right lower quadrant, involving the distal jejunum or proximal ileum. No perforation, fluid collection, or abscess. 2.  Aortic Atherosclerosis (ICD10-I70.0). Electronically Signed   By: Randa Ngo M.D.   On: 03/01/2020 20:20   DG Chest 2 View  Result Date: 03/01/2020 CLINICAL DATA:  Dyspnea, weakness, vomiting EXAM: CHEST - 2 VIEW COMPARISON:  08/10/2018 FINDINGS: The lungs are mildly hyperinflated in keeping with changes of underlying COPD. No confluent pulmonary infiltrate. No pneumothorax or pleural effusion. Interstitial edema noted on prior examination has resolved; the pulmonary vascularity is normal. Cardiac size is within normal limits. Osseous structures are age-appropriate. IMPRESSION: No active cardiopulmonary disease.  COPD. Electronically Signed   By: Fidela Salisbury MD   On: 03/01/2020 20:25      IMPRESSION AND PLAN:  1.  Acute diverticulitis with subsequent mild sepsis without severe sepsis or septic shock..  He has leukocytosis and tachypnea as well as hypoxemia. -The  patient will be admitted to medical monitored bed. -He will be continued on antibiotic therapy with IV aztreonam and Flagyl. -We will place him on pain management with as needed IV morphine sulfate. -We will follow his lactic acid as needed.  2.  Mild hypoxemia and elevated BN P could be related to mild acute on chronic diastolic CHF. -He will be placed on diuresis with IV Lasix to replace his p.o home Lasix. -His last 2D echo revealed EF of 60 to 65% and grade 1 diastolic dysfunction on 8/83/2549. -We will repeat a 2D echo in a.m. -We will follow serial troponin I's.  3.  Hypertension with stage IIIb chronic kidney disease. -We will continue his Hyzaar. -He will be placed on as needed IV labetalol.  4.  Depression. -We will continue Lexapro and trazodone.  5.  DVT prophylaxis. -Subcutaneous Lovenox.   All the records are reviewed  and case discussed with ED provider. The plan of care was discussed in details with the patient (and family). I answered all questions. The patient agreed to proceed with the above mentioned plan. Further management will depend upon hospital course.   CODE STATUS: The patient is DNR/DNI.  Status is: Inpatient  Remains inpatient appropriate because:Ongoing active pain requiring inpatient pain management, Ongoing diagnostic testing needed not appropriate for outpatient work up, Unsafe d/c plan, IV treatments appropriate due to intensity of illness or inability to take PO and Inpatient level of care appropriate due to severity of illness   Dispo: The patient is from: Home              Anticipated d/c is to: Home              Anticipated d/c date is: 2 days              Patient currently is not medically stable to d/c.   TOTAL TIME TAKING CARE OF THIS PATIENT: 55 minutes.    Christel Mormon M.D on 03/02/2020 at 12:02 AM  [facility]   From 7 PM-7 AM, contact night-coverage [email or web address]  CC: Primary care physician; Juluis Pitch,  MD   Note: This dictation was prepared with Dragon dictation along with smaller phrase technology. Any transcriptional typo errors that result from this process are unintentional.

## 2020-03-02 NOTE — Progress Notes (Signed)
Pharmacy Antibiotic Note  SAHITH NURSE is a 86 y.o. male admitted on 03/01/2020 with diverticulitis.  Pharmacy has been consulted for aztreonam d/t PCN allx w/o documented tolerance of a cephalosporin dosing. Patient received aztreonam 2g IV x 1 in the ED.  Plan: Will continue aztreonam 1g IV q8h and will adjust doses per changes in renal function.  Will continue to monitor.  Height: 5\' 10"  (177.8 cm) Weight: 95.3 kg (210 lb) IBW/kg (Calculated) : 73  Temp (24hrs), Avg:98.7 F (37.1 C), Min:98.7 F (37.1 C), Max:98.7 F (37.1 C)  Recent Labs  Lab 03/01/20 1904 03/01/20 2035  WBC 15.8*  --   CREATININE 1.75*  --   LATICACIDVEN  --  1.3    Estimated Creatinine Clearance: 31.2 mL/min (A) (by C-G formula based on SCr of 1.75 mg/dL (H)).    Allergies  Allergen Reactions  . Penicillins Rash, Other (See Comments) and Hives    Peeling around ankles, ?cough  . Bee Venom Palpitations    INSECT BITES/STINGS  . Darvon [Propoxyphene] Nausea Only and Rash    Thank you for allowing pharmacy to be a part of this patient's care.  Tobie Lords, PharmD, BCPS Clinical Pharmacist 03/02/2020 12:32 AM

## 2020-03-02 NOTE — Progress Notes (Signed)
Np notified of soft bp. Orders to hold lasix for now.

## 2020-03-02 NOTE — Progress Notes (Signed)
*  PRELIMINARY RESULTS* Echocardiogram 2D Echocardiogram has been performed.  Sherrie Sport 03/02/2020, 10:21 AM

## 2020-03-02 NOTE — Progress Notes (Signed)
Np updated and notified of soft BP. Pt is asymptomatic. No new orders for now.

## 2020-03-02 NOTE — Progress Notes (Signed)
PROGRESS NOTE    Trevor Ferguson  WCB:[id number] DOB: 01/26/1926 DOA: 03/01/2020 PCP: Juluis Pitch, MD   Brief Narrative:  Trevor Ferguson  is a 86 y.o. Caucasian male with a known history of COPD, hypertension, prostate CA sleep apnea and CVA, presented to the emergency room with acute onset of generalized weakness and fatigue with associated vomiting yesterday with presyncope.  Is been having abdominal discomfort since this morning and has been having loose bowel movements intermittently over the last 6 weeks.  No melena or bright red bleeding per rectum.  He denied any fever or chills.  He had stool culture that grew E. coli and was given p.o. Cipro which she has been taking over the last 5 days.  Is usually on oxygen at night for his COPD.  He admitted to occasional paroxysmal nocturnal dyspnea as well as occasional orthopnea. CT abdomen concerning for acute diverticulitis.  Subjective: Patient was feeling little better when seen today.  Abdominal pain and vomiting was improved.  Continues to have mild nausea.  Patient has chronic orthopnea and uses 2-3 pillows.  No recent change.  Assessment & Plan:   Active Problems:   Acute diverticulitis  Acute diverticulitis.  Sepsis ruled out.  Initially met some sepsis criteria with leukocytosis and being tachypneic.  Blood cultures remain negative. -Continue aztreonam and Flagyl. -Continue supportive care.  Acute on chronic hypoxic respiratory failure.  Resolved.  Now saturating well on his baseline oxygen of 2 L.  BNP was mildly elevated at 331 but patient also has CKD.  He has some edema around his ankles which is chronic and uses Lasix 20 mg at home. Echocardiogram was within normal limits. Was started on IV Lasix. -Discontinue IV Lasix as there is an increase in creatinine. -We will restart home dose of Lasix from tomorrow. -Continue supplemental oxygen.  CKD stage IIIb.  Mild increase in creatinine today to 1.95. -Discontinue IV  Lasix. -Continue to monitor -Avoid nephrotoxins  Hypertension.  Blood pressure on softer side.  Patient was on Hyzaar at home. -Holding home antihypertensives.  Depression.  No acute concern. -Continue home dose of Lexapro and trazodone.  Objective: Vitals:   03/02/20 0322 03/02/20 0630 03/02/20 1317 03/02/20 1543  BP: (!) 94/45 (!) 98/45 (!) 104/52 105/68  Pulse: 65 62 65 (!) 56  Resp: _0 Temp: 97.8 F (36.6 C)   97.6 F (36.4 C)  TempSrc: Oral   Oral  SpO2: 95% 97% 95% 98%  Weight:      Height:        Intake/Output Summary (Last 24 hours) at 03/02/2020 1716 Last data filed at 03/02/2020 0700 Gross per 24 hour  Intake 489.14 ml  Output 250 ml  Net 239.14 ml   Filed Weights   03/01/20 1901  Weight: 95.3 kg    Examination:  General exam: Pleasant, obese elderly man, appears calm and comfortable  Respiratory system: Clear to auscultation. Respiratory effort normal. Cardiovascular system: S1 & S2 heard, RRR. No JVD, murmurs, rubs, gallops or clicks. Gastrointestinal system: Soft, nontender, nondistended, bowel sounds positive. Central nervous system: Alert and oriented. No focal neurological deficits.Symmetric 5 x 5 power. Extremities: 1+ edema around ankles, no cyanosis, pulses intact and symmetrical. Psychiatry: Judgement and insight appear normal.   DVT prophylaxis: Lovenox Code Status: DNR Family Communication: No family at bedside  Disposition Plan:  Status is: Inpatient  Remains inpatient appropriate because:Inpatient level of care appropriate due to severity of illness   Dispo: The patient  is from: Home              Anticipated d/c is to: Home              Anticipated d/c date is: 2 days              Patient currently is not medically stable to d/c.   Consultants:   None  Procedures:  Antimicrobials:  Aztreonam Flagyl  Data Reviewed: I have personally reviewed following labs and imaging studies  CBC: Recent Labs  Lab  03/01/20 1904 03/02/20 0621  WBC 15.8* 20.2*  HGB 12.5* 11.1*  HCT 35.3* 31.2*  MCV 90.5 90.2  PLT 139* 607*   Basic Metabolic Panel: Recent Labs  Lab 03/01/20 1904 03/02/20 0621  NA 138 137  K 3.7 3.7  CL 107 104  CO2 21* 24  GLUCOSE 147* 127*  BUN 34* 39*  CREATININE 1.75* 1.95*  CALCIUM 9.0 8.3*   GFR: Estimated Creatinine Clearance: 28 mL/min (A) (by C-G formula based on SCr of 1.95 mg/dL (H)). Liver Function Tests: Recent Labs  Lab 03/01/20 1907  AST 17  ALT 12  ALKPHOS 60  BILITOT 1.4*  PROT 6.4*  ALBUMIN 4.0   Recent Labs  Lab 03/01/20 1907  LIPASE 36   No results for input(s): AMMONIA in the last 168 hours. Coagulation Profile: Recent Labs  Lab 03/01/20 1907  INR 1.1   Cardiac Enzymes: No results for input(s): CKTOTAL, CKMB, CKMBINDEX, TROPONINI in the last 168 hours. BNP (last 3 results) No results for input(s): PROBNP in the last 8760 hours. HbA1C: No results for input(s): HGBA1C in the last 72 hours. CBG: No results for input(s): GLUCAP in the last 168 hours. Lipid Profile: No results for input(s): CHOL, HDL, LDLCALC, TRIG, CHOLHDL, LDLDIRECT in the last 72 hours. Thyroid Function Tests: Recent Labs    03/02/20 0621  TSH 0.657   Anemia Panel: No results for input(s): VITAMINB12, FOLATE, FERRITIN, TIBC, IRON, RETICCTPCT in the last 72 hours. Sepsis Labs: Recent Labs  Lab 03/01/20 2035  LATICACIDVEN 1.3    Recent Results (from the past 240 hour(s))  SARS Coronavirus 2 by RT PCR (hospital order, performed in [facility] hospital lab) Nasopharyngeal Nasopharyngeal Swab     Status: None   Collection Time: 03/01/20  8:53 PM   Specimen: Nasopharyngeal Swab  Result Value Ref Range Status   SARS Coronavirus 2 NEGATIVE NEGATIVE Final    Comment: (NOTE) SARS-CoV-2 target nucleic acids are NOT DETECTED.  The SARS-CoV-2 RNA is generally detectable in upper and lower respiratory specimens during the acute phase of infection. The  lowest concentration of SARS-CoV-2 viral copies this assay can detect is 250 copies / mL. A negative result does not preclude SARS-CoV-2 infection and should not be used as the sole basis for treatment or other patient management decisions.  A negative result may occur with improper specimen collection / handling, submission of specimen other than nasopharyngeal swab, presence of viral mutation(s) within the areas targeted by this assay, and inadequate number of viral copies (<250 copies / mL). A negative result must be combined with clinical observations, patient history, and epidemiological information.  Fact Sheet for Patients:   StrictlyIdeas.no  Fact Sheet for Healthcare Providers: BankingDealers.co.za  This test is not yet approved or  cleared by the [place] FDA and has been authorized for detection and/or diagnosis of SARS-CoV-2 by FDA under an Emergency Use Authorization (EUA).  This EUA will remain in effect (meaning this test  can be used) for the duration of the COVID-19 declaration under Section 564(b)(1) of the Act, 21 U.S.C. section 360bbb-3(b)(1), unless the authorization is terminated or revoked sooner.  Performed at [facility], [address] [place], [place] [address]   Blood Culture (routine x 2)     Status: None (Preliminary result)   Collection Time: 03/01/20  8:53 PM   Specimen: BLOOD  Result Value Ref Range Status   Specimen Description BLOOD BLOOD LEFT WRIST  Final   Special Requests   Final    BOTTLES DRAWN AEROBIC AND ANAEROBIC Blood Culture results may not be optimal due to an inadequate volume of blood received in culture bottles   Culture   Final    NO GROWTH < 12 HOURS Performed at [facility], [address] [place], [place] [address]    Report Status PENDING  Incomplete  Blood Culture (routine x 2)     Status: None (Preliminary result)   Collection Time: 03/01/20  8:53  PM   Specimen: BLOOD  Result Value Ref Range Status   Specimen Description BLOOD RIGHT ANTECUBITAL  Final   Special Requests   Final    BOTTLES DRAWN AEROBIC AND ANAEROBIC Blood Culture adequate volume   Culture   Final    NO GROWTH < 12 HOURS Performed at [facility], [address] [place], [place] [address]    Report Status PENDING  Incomplete     Radiology Studies: CT ABDOMEN PELVIS WO CONTRAST  Result Date: 03/01/2020 CLINICAL DATA:  Nausea and vomiting, weakness, found down EXAM: CT ABDOMEN AND PELVIS WITHOUT CONTRAST TECHNIQUE: Multidetector CT imaging of the abdomen and pelvis was performed following the standard protocol without IV contrast. COMPARISON:  None. FINDINGS: Lower chest: There is bibasilar scarring and fibrosis. No acute pleural or parenchymal lung disease. Hepatobiliary: No focal liver abnormality is seen. No gallstones, gallbladder wall thickening, or biliary dilatation. Pancreas: Unremarkable. No pancreatic ductal dilatation or surrounding inflammatory changes. Spleen: Normal in size without focal abnormality. Adrenals/Urinary Tract: Calcifications of the bilateral renal hila most consistent with vascular calcifications. No definite urinary tract calculi. No obstructive uropathy. The bladder is grossly normal. The adrenals are unremarkable. Stomach/Bowel: There is no bowel obstruction or ileus. Inflammatory changes surround a loop of small bowel within the central lower abdomen, reference axial image 56 through 61 of series 2, and image 21-35 of series 6. Overall, the appearance is most consistent with diverticulitis of the small bowel. No fluid collection or abscess. The appendix is surgically absent. Vascular/Lymphatic: Aortic atherosclerosis. No enlarged abdominal or pelvic lymph nodes. Reproductive: Radiotherapy seeds are seen within the prostate bed. Other: No free fluid or free intraperitoneal gas. No abdominal wall hernia. Musculoskeletal: No acute or  destructive bony lesions. Reconstructed images demonstrate no additional findings. IMPRESSION: 1. Acute diverticulitis of the distal small bowel in the right lower quadrant, involving the distal jejunum or proximal ileum. No perforation, fluid collection, or abscess. 2.  Aortic Atherosclerosis (ICD10-I70.0). Electronically Signed   By: Randa Ngo M.D.   On: 03/01/2020 20:20   DG Chest 2 View  Result Date: 03/01/2020 CLINICAL DATA:  Dyspnea, weakness, vomiting EXAM: CHEST - 2 VIEW COMPARISON:  08/10/2018 FINDINGS: The lungs are mildly hyperinflated in keeping with changes of underlying COPD. No confluent pulmonary infiltrate. No pneumothorax or pleural effusion. Interstitial edema noted on prior examination has resolved; the pulmonary vascularity is normal. Cardiac size is within normal limits. Osseous structures are age-appropriate. IMPRESSION: No active cardiopulmonary disease.  COPD. Electronically Signed   By: Fidela Salisbury MD   On: 03/01/2020 20:25   ECHOCARDIOGRAM COMPLETE  Result Date: 03/02/2020    ECHOCARDIOGRAM REPORT   Patient Name:   DASHIEL BERGQUIST Date of Exam: 03/02/2020 Medical Rec #:  [id number]       Height:       70.0 in Accession #:    [id number]      Weight:       210.0 lb Date of Birth:  [birth year]       BSA:          2.131 m Patient Age:    36 years        BP:           98/45 mmHg Patient Gender: M               HR:           62 bpm. Exam Location:  [facility] Procedure: 2D Echo, Cardiac Doppler and Color Doppler Indications:     CHFR- acute diastolic 291.91  History:         Patient has prior history of Echocardiogram examinations, most                  recent 09/04/2018. COPD and Stroke; Risk Factors:Hypertension.  Sonographer:     Sherrie Sport RDCS (AE) Referring Phys:  [id number] Arvella Merles MANSY Diagnosing Phys: Isaias Cowman MD IMPRESSIONS  1. Left ventricular ejection fraction, by estimation, is 60 to 65%. The left ventricle has normal function. The left ventricle has no regional wall  motion abnormalities. Left ventricular diastolic parameters were normal.  2. Right ventricular systolic function is normal. The right ventricular size is normal. There is severely elevated pulmonary artery systolic pressure.  3. The mitral valve is normal in structure. Trivial mitral valve regurgitation. No evidence of mitral stenosis.  4. The aortic valve is normal in structure. Aortic valve regurgitation is not visualized. No aortic stenosis is present.  5. The inferior vena cava is normal in size with greater than 50% respiratory variability, suggesting right atrial pressure of 3 mmHg. FINDINGS  Left Ventricle: Left ventricular ejection fraction, by estimation, is 60 to 65%. The left ventricle has normal function. The left ventricle has no regional wall motion abnormalities. The left ventricular internal cavity size was normal in size. There is  no left ventricular hypertrophy. Left ventricular diastolic parameters were normal. Right Ventricle: The right ventricular size is normal. No increase in right ventricular wall thickness. Right ventricular systolic function is normal. There is severely elevated pulmonary artery systolic pressure. The tricuspid regurgitant velocity is 3.56 m/s, and with an assumed right atrial pressure of 10 mmHg, the estimated right ventricular systolic pressure is 59.9 mmHg. Left Atrium: Left atrial size was normal in size. Right Atrium: Right atrial size was normal in size. Pericardium: There is no evidence of pericardial effusion. Mitral Valve: The mitral valve is normal in structure. Normal mobility of the mitral valve leaflets. Trivial mitral valve regurgitation. No evidence of mitral valve stenosis. Tricuspid Valve: The tricuspid valve is normal in structure. Tricuspid valve regurgitation is trivial. No evidence of tricuspid stenosis. Aortic Valve: The aortic valve is normal in structure. Aortic valve regurgitation is not visualized. No aortic stenosis is present. Aortic valve mean  gradient measures 5.0 mmHg. Aortic valve peak gradient measures 9.3 mmHg. Aortic valve area, by VTI measures 1.85 cm. Pulmonic Valve: The pulmonic valve was normal in structure. Pulmonic valve regurgitation is not visualized.  No evidence of pulmonic stenosis. Aorta: The aortic root is normal in size and structure. Venous: The inferior vena cava is normal in size with greater than 50% respiratory variability, suggesting right atrial pressure of 3 mmHg. IAS/Shunts: No atrial level shunt detected by color flow Doppler.  LEFT VENTRICLE PLAX 2D LVIDd:         4.55 cm  Diastology LVIDs:         2.73 cm  LV e' lateral:   7.51 cm/s LV PW:         1.28 cm  LV E/e' lateral: 14.0 LV IVS:        0.80 cm  LV e' medial:    8.92 cm/s LVOT diam:     2.10 cm  LV E/e' medial:  11.8 LV SV:         63 LV SV Index:   30 LVOT Area:     3.46 cm  RIGHT VENTRICLE RV Basal diam:  3.63 cm RV S prime:     16.90 cm/s TAPSE (M-mode): 6.2 cm LEFT ATRIUM           Index       RIGHT ATRIUM           Index LA diam:      3.80 cm 1.78 cm/m  RA Area:     28.00 cm LA Vol (A2C): 81.4 ml 38.20 ml/m RA Volume:   95.10 ml  44.63 ml/m LA Vol (A4C): 80.2 ml 37.63 ml/m  AORTIC VALVE                    PULMONIC VALVE AV Area (Vmax):    1.94 cm     PV Vmax:        0.81 m/s AV Area (Vmean):   1.69 cm     PV Peak grad:   2.6 mmHg AV Area (VTI):     1.85 cm     RVOT Peak grad: 3 mmHg AV Vmax:           152.33 cm/s AV Vmean:          108.000 cm/s AV VTI:            0.340 m AV Peak Grad:      9.3 mmHg AV Mean Grad:      5.0 mmHg LVOT Vmax:         85.50 cm/s LVOT Vmean:        52.600 cm/s LVOT VTI:          0.182 m LVOT/AV VTI ratio: 0.53  AORTA Ao Root diam: 3.70 cm MITRAL VALVE                TRICUSPID VALVE MV Area (PHT): 3.13 cm     TR Peak grad:   50.7 mmHg MV Decel Time: 242 msec     TR Vmax:        356.00 cm/s MV E velocity: 105.00 cm/s MV A velocity: 98.50 cm/s   SHUNTS MV E/A ratio:  1.07         Systemic VTI:  0.18 m                              Systemic Diam: 2.10 cm Isaias Cowman MD Electronically signed by Isaias Cowman MD Signature Date/Time: 03/02/2020/12:58:25 PM    Final     Scheduled Meds: . cholecalciferol  1,000 Units Oral Daily  .  enoxaparin (LOVENOX) injection  30 mg Subcutaneous Q24H  . escitalopram  10 mg Oral Daily  . traZODone  150 mg Oral QHS   Continuous Infusions: . aztreonam Stopped (03/02/20 0700)  . metronidazole Stopped (03/02/20 1634)     LOS: 1 day   Time spent: 40 minutes.  Lorella Nimrod, MD [facility]  If 7PM-7AM, please contact night-coverage [email or web address]  03/02/2020, 5:16 PM   This record has been created using Systems analyst. Errors have been sought and corrected,but may not always be located. Such creation errors do not reflect on the standard of care.

## 2020-03-02 NOTE — Progress Notes (Signed)
PHARMACIST - PHYSICIAN COMMUNICATION  CONCERNING:  Enoxaparin (Lovenox) for DVT Prophylaxis   RECOMMENDATION: Patient was prescribed enoxaprin 40mg  q24 hours for VTE prophylaxis.   Filed Weights   03/01/20 1901  Weight: 95.3 kg (210 lb)    Body mass index is 30.13 kg/m.  Estimated Creatinine Clearance: 28 mL/min (A) (by C-G formula based on SCr of 1.95 mg/dL (H)).   Patient is candidate for enoxaparin 30mg  every 24 hours based on CrCl <44ml/min   DESCRIPTION: Pharmacy has adjusted enoxaparin dose per [facility]/[place] policy.  Patient is now receiving enoxaparin 30mg  every 24 hours.  Pernell Dupre, PharmD, BCPS Clinical Pharmacist 03/02/2020 9:23 AM

## 2020-03-03 DIAGNOSIS — K5792 Diverticulitis of intestine, part unspecified, without perforation or abscess without bleeding: Secondary | ICD-10-CM | POA: Diagnosis not present

## 2020-03-03 LAB — BASIC METABOLIC PANEL
Anion gap: 10 (ref 5–15)
BUN: 38 mg/dL — ABNORMAL HIGH (ref 8–23)
CO2: 20 mmol/L — ABNORMAL LOW (ref 22–32)
Calcium: 7.6 mg/dL — ABNORMAL LOW (ref 8.9–10.3)
Chloride: 103 mmol/L (ref 98–111)
Creatinine, Ser: 2.34 mg/dL — ABNORMAL HIGH (ref 0.61–1.24)
GFR calc Af Amer: 27 mL/min — ABNORMAL LOW (ref >60)
GFR calc non Af Amer: 23 mL/min — ABNORMAL LOW (ref >60)
Glucose, Bld: 107 mg/dL — ABNORMAL HIGH (ref 70–99)
Potassium: 4.4 mmol/L (ref 3.5–5.1)
Sodium: 133 mmol/L — ABNORMAL LOW (ref 135–145)

## 2020-03-03 LAB — CBC
HCT: 27.9 % — ABNORMAL LOW (ref 39.0–52.0)
Hemoglobin: 10 g/dL — ABNORMAL LOW (ref 13.0–17.0)
MCH: 32.7 pg (ref 26.0–34.0)
MCHC: 35.8 g/dL (ref 30.0–36.0)
MCV: 91.2 fL (ref 80.0–100.0)
Platelets: 106 10*3/uL — ABNORMAL LOW (ref 150–400)
RBC: 3.06 MIL/uL — ABNORMAL LOW (ref 4.22–5.81)
RDW: 14.6 % (ref 11.5–15.5)
WBC: 13.6 10*3/uL — ABNORMAL HIGH (ref 4.0–10.5)
nRBC: 0 % (ref 0.0–0.2)

## 2020-03-03 LAB — C DIFFICILE QUICK SCREEN W PCR REFLEX
C Diff antigen: NEGATIVE
C Diff interpretation: NOT DETECTED
C Diff toxin: NEGATIVE

## 2020-03-03 MED ORDER — LORAZEPAM 2 MG/ML IJ SOLN: 1.0000 mg | INTRAMUSCULAR | Status: DC | PRN

## 2020-03-03 MED ORDER — DM-GUAIFENESIN ER 30-600 MG PO TB12
1.0000 | ORAL_TABLET | Freq: Two times a day (BID) | ORAL | Status: DC
  Administered 2020-03-03 – 2020-03-04 (×3): 1 via ORAL
  Filled 2020-03-03 (×3): qty 1

## 2020-03-03 MED ORDER — SODIUM CHLORIDE 0.9 % IV SOLN: INTRAVENOUS | Status: DC

## 2020-03-03 MED ORDER — LORAZEPAM 1 MG PO TABS: 1.0000 mg | ORAL_TABLET | ORAL | Status: DC | PRN

## 2020-03-03 NOTE — Progress Notes (Signed)
Pharmacy Antibiotic Note  Trevor Ferguson is a 86 y.o. male admitted on 03/01/2020 with diverticulitis.  Pharmacy has been consulted for aztreonam d/t PCN allx w/o documented tolerance of a cephalosporin dosing. Patient received aztreonam 2g IV x 1 in the ED.  Plan: Will continue aztreonam 1g IV q8h and will adjust doses per changes in renal function.  Will continue to monitor.  Height: 5\' 10"  (177.8 cm) Weight: 95.3 kg (210 lb) IBW/kg (Calculated) : 73  Temp (24hrs), Avg:98 F (36.7 C), Min:97.6 F (36.4 C), Max:98.4 F (36.9 C)  Recent Labs  Lab 03/01/20 1904 03/01/20 2035 03/02/20 0621 03/03/20 0420  WBC 15.8*  --  20.2* 13.6*  CREATININE 1.75*  --  1.95* 2.34*  LATICACIDVEN  --  1.3  --   --     Estimated Creatinine Clearance: 23.3 mL/min (A) (by C-G formula based on SCr of 2.34 mg/dL (H)).    Allergies  Allergen Reactions  . Penicillins Rash, Other (See Comments) and Hives    Peeling around ankles, ?cough  . Bee Venom Palpitations    INSECT BITES/STINGS  . Darvon [Propoxyphene] Nausea Only and Rash    7/14 aztreonam>>  7/14 Flagyl>>   Thank you for allowing pharmacy to be a part of this patient's care.  Rayna Sexton, PharmD, BCPS Clinical Pharmacist 03/03/2020 9:04 AM

## 2020-03-03 NOTE — Progress Notes (Signed)
PROGRESS NOTE    Trevor Ferguson  LTJ:[id number] DOB: 1926-05-01 DOA: 03/01/2020 PCP: Juluis Pitch, MD   Brief Narrative:  Trevor Ferguson  is a 86 y.o. Caucasian male with a known history of COPD, hypertension, prostate CA sleep apnea and CVA, presented to the emergency room with acute onset of generalized weakness and fatigue with associated vomiting yesterday with presyncope.  Is been having abdominal discomfort since this morning and has been having loose bowel movements intermittently over the last 6 weeks.  No melena or bright red bleeding per rectum.  He denied any fever or chills.  He had stool culture that grew E. coli and was given p.o. Cipro which she has been taking over the last 5 days.  Is usually on oxygen at night for his COPD.  He admitted to occasional paroxysmal nocturnal dyspnea as well as occasional orthopnea. CT abdomen concerning for acute diverticulitis.  Subjective: Patient continued to have mild diffuse abdominal pain.  He was complaining of 2 watery bowel movements since morning.  Assessment & Plan:   Active Problems:   Diverticulitis  Acute diverticulitis.  Sepsis ruled out.  Initially met some sepsis criteria with leukocytosis and being tachypneic.  Blood cultures remain negative. -Continue aztreonam and Flagyl. -Continue supportive care. -We will obtain PT/OT evaluation for disposition.  As patient lives in independent living facility alone with his wife in skilled nursing facility.  Diarrhea.  Patient was complaining of watery bowel movements since this morning.  He recently completed the course of ciprofloxacin for a positive stool culture for E. coli.  Currently on aztreonam and Flagyl. Remained afebrile with some improvement in leukocytosis. -We will check for C. Difficile-this is not an hospital-acquired at this time.  Acute on chronic hypoxic respiratory failure.  Resolved.  Now saturating well on his baseline oxygen of 2 L.  BNP was mildly elevated at  331 but patient also has CKD.  He has some edema around his ankles which is chronic and uses Lasix 20 mg at home. Echocardiogram was within normal limits. Was started on IV Lasix. -Discontinue IV Lasix as there is an increase in creatinine. -Continue supplemental oxygen.  CKD stage IIIb.  Continue to have worsening in creatinine, 2.34 today despite stopping Lasix. -Give him some gentle IV fluid. -Continue to monitor -Avoid nephrotoxins  Hypertension.  Blood pressure within goal today.  Patient was on Hyzaar at home. -Holding home antihypertensives-can be restarted once blood pressure goes up.  Depression.  No acute concern. -Continue home dose of Lexapro and trazodone.  Objective: Vitals:   03/02/20 2003 03/03/20 0031 03/03/20 0732 03/03/20 1136  BP: (!) 116/51 (!) 111/57 117/61 110/62  Pulse: (!) 56 60 72 72  Resp: _0 Temp: 98.4 F (36.9 C) 97.6 F (36.4 C) 98.2 F (36.8 C) 98.3 F (36.8 C)  TempSrc: Oral Oral Oral Oral  SpO2: 98% 96% 96% 91%  Weight:      Height:       No intake or output data in the 24 hours ending 03/03/20 1520 Filed Weights   03/01/20 1901  Weight: 95.3 kg    Examination:  General exam: Well-developed, obese elderly man, in no distress. Respiratory system: Clear to auscultation. Respiratory effort normal. Cardiovascular system: S1 & S2 heard, RRR. No JVD, murmurs, rubs, gallops or clicks. Gastrointestinal system: Soft, mild diffuse tenderness, bowel sounds positive. Central nervous system: Alert and oriented. No focal neurological deficits. Extremities: 1+ edema around ankles, no cyanosis, pulses intact and symmetrical.  Psychiatry: Judgement and insight appear normal.   DVT prophylaxis: Lovenox Code Status: DNR Family Communication: No family at bedside  Disposition Plan:  Status is: Inpatient  Remains inpatient appropriate because:Inpatient level of care appropriate due to severity of illness   Dispo: The patient is from:  Home              Anticipated d/c is to: Home              Anticipated d/c date is: 2 days              Patient currently is not medically stable to d/c.   Consultants:   None  Procedures:  Antimicrobials:  Aztreonam Flagyl  Data Reviewed: I have personally reviewed following labs and imaging studies  CBC: Recent Labs  Lab 03/01/20 1904 03/02/20 0621 03/03/20 0420  WBC 15.8* 20.2* 13.6*  HGB 12.5* 11.1* 10.0*  HCT 35.3* 31.2* 27.9*  MCV 90.5 90.2 91.2  PLT 139* 126* 163*   Basic Metabolic Panel: Recent Labs  Lab 03/01/20 1904 03/02/20 0621 03/03/20 0420  NA 138 137 133*  K 3.7 3.7 4.4  CL 107 104 103  CO2 21* 24 20*  GLUCOSE 147* 127* 107*  BUN 34* 39* 38*  CREATININE 1.75* 1.95* 2.34*  CALCIUM 9.0 8.3* 7.6*   GFR: Estimated Creatinine Clearance: 23.3 mL/min (A) (by C-G formula based on SCr of 2.34 mg/dL (H)). Liver Function Tests: Recent Labs  Lab 03/01/20 1907  AST 17  ALT 12  ALKPHOS 60  BILITOT 1.4*  PROT 6.4*  ALBUMIN 4.0   Recent Labs  Lab 03/01/20 1907  LIPASE 36   No results for input(s): AMMONIA in the last 168 hours. Coagulation Profile: Recent Labs  Lab 03/01/20 1907  INR 1.1   Cardiac Enzymes: No results for input(s): CKTOTAL, CKMB, CKMBINDEX, TROPONINI in the last 168 hours. BNP (last 3 results) No results for input(s): PROBNP in the last 8760 hours. HbA1C: No results for input(s): HGBA1C in the last 72 hours. CBG: No results for input(s): GLUCAP in the last 168 hours. Lipid Profile: No results for input(s): CHOL, HDL, LDLCALC, TRIG, CHOLHDL, LDLDIRECT in the last 72 hours. Thyroid Function Tests: Recent Labs    03/02/20 0621  TSH 0.657   Anemia Panel: No results for input(s): VITAMINB12, FOLATE, FERRITIN, TIBC, IRON, RETICCTPCT in the last 72 hours. Sepsis Labs: Recent Labs  Lab 03/01/20 2035  LATICACIDVEN 1.3    Recent Results (from the past 240 hour(s))  SARS Coronavirus 2 by RT PCR (hospital order,  performed in [facility] hospital lab) Nasopharyngeal Nasopharyngeal Swab     Status: None   Collection Time: 03/01/20  8:53 PM   Specimen: Nasopharyngeal Swab  Result Value Ref Range Status   SARS Coronavirus 2 NEGATIVE NEGATIVE Final    Comment: (NOTE) SARS-CoV-2 target nucleic acids are NOT DETECTED.  The SARS-CoV-2 RNA is generally detectable in upper and lower respiratory specimens during the acute phase of infection. The lowest concentration of SARS-CoV-2 viral copies this assay can detect is 250 copies / mL. A negative result does not preclude SARS-CoV-2 infection and should not be used as the sole basis for treatment or other patient management decisions.  A negative result may occur with improper specimen collection / handling, submission of specimen other than nasopharyngeal swab, presence of viral mutation(s) within the areas targeted by this assay, and inadequate number of viral copies (<250 copies / mL). A negative result must be combined with clinical observations,  patient history, and epidemiological information.  Fact Sheet for Patients:   StrictlyIdeas.no  Fact Sheet for Healthcare Providers: BankingDealers.co.za  This test is not yet approved or  cleared by the [place] FDA and has been authorized for detection and/or diagnosis of SARS-CoV-2 by FDA under an Emergency Use Authorization (EUA).  This EUA will remain in effect (meaning this test can be used) for the duration of the COVID-19 declaration under Section 564(b)(1) of the Act, 21 U.S.C. section 360bbb-3(b)(1), unless the authorization is terminated or revoked sooner.  Performed at [facility], [address] [place], [place] [address]   Blood Culture (routine x 2)     Status: None (Preliminary result)   Collection Time: 03/01/20  8:53 PM   Specimen: BLOOD  Result Value Ref Range Status   Specimen Description BLOOD BLOOD LEFT WRIST  Final     Special Requests   Final    BOTTLES DRAWN AEROBIC AND ANAEROBIC Blood Culture results may not be optimal due to an inadequate volume of blood received in culture bottles   Culture   Final    NO GROWTH 2 DAYS Performed at [facility], [address], [place], [place] [address]    Report Status PENDING  Incomplete  Blood Culture (routine x 2)     Status: None (Preliminary result)   Collection Time: 03/01/20  8:53 PM   Specimen: BLOOD  Result Value Ref Range Status   Specimen Description BLOOD RIGHT ANTECUBITAL  Final   Special Requests   Final    BOTTLES DRAWN AEROBIC AND ANAEROBIC Blood Culture adequate volume   Culture   Final    NO GROWTH 2 DAYS Performed at [facility], [address], [place],  [address]    Report Status PENDING  Incomplete     Radiology Studies: CT ABDOMEN PELVIS WO CONTRAST  Result Date: 03/01/2020 CLINICAL DATA:  Nausea and vomiting, weakness, found down EXAM: CT ABDOMEN AND PELVIS WITHOUT CONTRAST TECHNIQUE: Multidetector CT imaging of the abdomen and pelvis was performed following the standard protocol without IV contrast. COMPARISON:  None. FINDINGS: Lower chest: There is bibasilar scarring and fibrosis. No acute pleural or parenchymal lung disease. Hepatobiliary: No focal liver abnormality is seen. No gallstones, gallbladder wall thickening, or biliary dilatation. Pancreas: Unremarkable. No pancreatic ductal dilatation or surrounding inflammatory changes. Spleen: Normal in size without focal abnormality. Adrenals/Urinary Tract: Calcifications of the bilateral renal hila most consistent with vascular calcifications. No definite urinary tract calculi. No obstructive uropathy. The bladder is grossly normal. The adrenals are unremarkable. Stomach/Bowel: There is no bowel obstruction or ileus. Inflammatory changes surround a loop of small bowel within the central lower abdomen, reference axial image 56 through 61 of series 2, and image  21-35 of series 6. Overall, the appearance is most consistent with diverticulitis of the small bowel. No fluid collection or abscess. The appendix is surgically absent. Vascular/Lymphatic: Aortic atherosclerosis. No enlarged abdominal or pelvic lymph nodes. Reproductive: Radiotherapy seeds are seen within the prostate bed. Other: No free fluid or free intraperitoneal gas. No abdominal wall hernia. Musculoskeletal: No acute or destructive bony lesions. Reconstructed images demonstrate no additional findings. IMPRESSION: 1. Acute diverticulitis of the distal small bowel in the right lower quadrant, involving the distal jejunum or proximal ileum. No perforation, fluid collection, or abscess. 2.  Aortic Atherosclerosis (ICD10-I70.0). Electronically Signed   By: Randa Ngo M.D.   On: 03/01/2020 20:20   DG Chest 2 View  Result Date: 03/01/2020 CLINICAL DATA:  Dyspnea, weakness, vomiting EXAM: CHEST - 2 VIEW COMPARISON:  08/10/2018 FINDINGS: The lungs are mildly hyperinflated in keeping with changes of underlying COPD. No confluent pulmonary infiltrate. No pneumothorax or pleural effusion. Interstitial edema noted on prior examination has resolved; the pulmonary vascularity is normal. Cardiac size is within normal limits. Osseous structures are age-appropriate. IMPRESSION: No active cardiopulmonary disease.  COPD. Electronically Signed   By: Fidela Salisbury MD   On: 03/01/2020 20:25   ECHOCARDIOGRAM COMPLETE  Result Date: 03/02/2020    ECHOCARDIOGRAM REPORT   Patient Name:   Trevor Ferguson Date of Exam: 03/02/2020 Medical Rec #:  [id number]       Height:       70.0 in Accession #:    [id number]      Weight:       210.0 lb Date of Birth:  1925-10-01       BSA:          2.131 m Patient Age:    53 years        BP:           98/45 mmHg Patient Gender: M               HR:           62 bpm. Exam Location:  [facility] Procedure: 2D Echo, Cardiac Doppler and Color Doppler Indications:     CHFR- acute diastolic 737.10  History:          Patient has prior history of Echocardiogram examinations, most                  recent 09/04/2018. COPD and Stroke; Risk Factors:Hypertension.  Sonographer:     Sherrie Sport RDCS (AE) Referring Phys:  [id number] Arvella Merles MANSY Diagnosing Phys: Isaias Cowman MD IMPRESSIONS  1. Left ventricular ejection fraction, by estimation, is 60 to 65%. The left ventricle has normal function. The left ventricle has no regional wall motion abnormalities. Left ventricular diastolic parameters were normal.  2. Right ventricular systolic function is normal. The right ventricular size is normal. There is severely elevated pulmonary artery systolic pressure.  3. The mitral valve is normal in structure. Trivial mitral valve regurgitation. No evidence of mitral stenosis.  4. The aortic valve is normal in structure. Aortic valve regurgitation is not visualized. No aortic stenosis is present.  5. The inferior vena cava is normal in size with greater than 50% respiratory variability, suggesting right atrial pressure of 3 mmHg. FINDINGS  Left Ventricle: Left ventricular ejection fraction, by estimation, is 60 to 65%. The left ventricle has normal function. The left ventricle has no regional wall motion abnormalities. The left ventricular internal cavity size was normal in size. There is  no left ventricular hypertrophy. Left ventricular diastolic parameters were normal. Right Ventricle: The right ventricular size is normal. No increase in right ventricular wall thickness. Right ventricular systolic function is normal. There is severely elevated pulmonary artery systolic pressure. The tricuspid regurgitant velocity is 3.56 m/s, and with an assumed right atrial pressure of 10 mmHg, the estimated right ventricular systolic pressure is 46.2 mmHg. Left Atrium: Left atrial size was normal in size. Right Atrium: Right atrial size was normal in size. Pericardium: There is no evidence of pericardial effusion. Mitral Valve: The mitral valve is  normal in structure. Normal mobility of the mitral valve leaflets. Trivial mitral valve regurgitation. No evidence of mitral valve stenosis. Tricuspid Valve: The tricuspid valve is normal in structure. Tricuspid valve regurgitation is trivial.  No evidence of tricuspid stenosis. Aortic Valve: The aortic valve is normal in structure. Aortic valve regurgitation is not visualized. No aortic stenosis is present. Aortic valve mean gradient measures 5.0 mmHg. Aortic valve peak gradient measures 9.3 mmHg. Aortic valve area, by VTI measures 1.85 cm. Pulmonic Valve: The pulmonic valve was normal in structure. Pulmonic valve regurgitation is not visualized. No evidence of pulmonic stenosis. Aorta: The aortic root is normal in size and structure. Venous: The inferior vena cava is normal in size with greater than 50% respiratory variability, suggesting right atrial pressure of 3 mmHg. IAS/Shunts: No atrial level shunt detected by color flow Doppler.  LEFT VENTRICLE PLAX 2D LVIDd:         4.55 cm  Diastology LVIDs:         2.73 cm  LV e' lateral:   7.51 cm/s LV PW:         1.28 cm  LV E/e' lateral: 14.0 LV IVS:        0.80 cm  LV e' medial:    8.92 cm/s LVOT diam:     2.10 cm  LV E/e' medial:  11.8 LV SV:         63 LV SV Index:   30 LVOT Area:     3.46 cm  RIGHT VENTRICLE RV Basal diam:  3.63 cm RV S prime:     16.90 cm/s TAPSE (M-mode): 6.2 cm LEFT ATRIUM           Index       RIGHT ATRIUM           Index LA diam:      3.80 cm 1.78 cm/m  RA Area:     28.00 cm LA Vol (A2C): 81.4 ml 38.20 ml/m RA Volume:   95.10 ml  44.63 ml/m LA Vol (A4C): 80.2 ml 37.63 ml/m  AORTIC VALVE                    PULMONIC VALVE AV Area (Vmax):    1.94 cm     PV Vmax:        0.81 m/s AV Area (Vmean):   1.69 cm     PV Peak grad:   2.6 mmHg AV Area (VTI):     1.85 cm     RVOT Peak grad: 3 mmHg AV Vmax:           152.33 cm/s AV Vmean:          108.000 cm/s AV VTI:            0.340 m AV Peak Grad:      9.3 mmHg AV Mean Grad:      5.0 mmHg LVOT  Vmax:         85.50 cm/s LVOT Vmean:        52.600 cm/s LVOT VTI:          0.182 m LVOT/AV VTI ratio: 0.53  AORTA Ao Root diam: 3.70 cm MITRAL VALVE                TRICUSPID VALVE MV Area (PHT): 3.13 cm     TR Peak grad:   50.7 mmHg MV Decel Time: 242 msec     TR Vmax:        356.00 cm/s MV E velocity: 105.00 cm/s MV A velocity: 98.50 cm/s   SHUNTS MV E/A ratio:  1.07         Systemic VTI:  0.18 m  Systemic Diam: 2.10 cm Isaias Cowman MD Electronically signed by Isaias Cowman MD Signature Date/Time: 03/02/2020/12:58:25 PM    Final     Scheduled Meds:  cholecalciferol  1,000 Units Oral Daily   dextromethorphan-guaiFENesin  1 tablet Oral BID   enoxaparin (LOVENOX) injection  30 mg Subcutaneous Q24H   escitalopram  10 mg Oral Daily   traZODone  150 mg Oral QHS   Continuous Infusions:  sodium chloride 75 mL/hr at 03/03/20 0837   aztreonam 1 g (03/03/20 0838)   metronidazole 500 mg (03/03/20 1452)     LOS: 2 days   Time spent: 40 minutes.  Lorella Nimrod, MD [facility]  If 7PM-7AM, please contact night-coverage [email or web address]  03/03/2020, 3:20 PM   This record has been created using Systems analyst. Errors have been sought and corrected,but may not always be located. Such creation errors do not reflect on the standard of care.

## 2020-03-03 NOTE — TOC Initial Note (Signed)
Transition of Care [facility]) - Initial/Assessment Note    Patient Details  Name: Trevor Ferguson MRN: [id number] Date of Birth: 12-03-1925  Transition of Care [facility]) CM/SW Contact:    Shelbie Hutching, RN Phone Number: 03/03/2020, 2:17 PM  Clinical Narrative:                  Patient admitted to the hospital for sepsis and diverticulitis.  Patient is from [facility] independent living facility where he lives alone.  Patient's wife is over on the skilled nursing side of the facility.  Patient is independent and drives.  Patient is current with his PCP.  Patient has a cane and [place] at home.   TOC will cont to follow patient and assist with discharge needs.  Expected Discharge Plan: Skilled Nursing Facility Barriers to Discharge: Continued Medical Work up   Patient Goals and CMS Choice   CMS Medicare.gov Compare Post Acute Care list provided to:: Patient Choice offered to / list presented to : Patient  Expected Discharge Plan and Services Expected Discharge Plan: [place]   Discharge Planning Services: CM Consult Post Acute Care Choice: [place] Living arrangements for the past 2 months: [place]                                      Prior Living Arrangements/Services Living arrangements for the past 2 months: [place] Lives with:: Self Patient language and need for interpreter reviewed:: Yes Do you feel safe going back to the place where you live?: Yes      Need for Family Participation in Patient Care: Yes (Comment) (diarrhea) Care giver support system in place?: Yes (comment) (daughter) Current home services: DME (cane and [place]) Criminal Activity/Legal Involvement Pertinent to Current Situation/Hospitalization: No - Comment as needed  Activities of Daily Living Home Assistive Devices/Equipment: Eyeglasses, Hearing aid ADL Screening (condition at time of admission) Patient's cognitive ability  adequate to safely complete daily activities?: Yes Is the patient deaf or have difficulty hearing?: Yes Does the patient have difficulty seeing, even when wearing glasses/contacts?: Yes Does the patient have difficulty concentrating, remembering, or making decisions?: No Patient able to express need for assistance with ADLs?: Yes Does the patient have difficulty dressing or bathing?: No Independently performs ADLs?: Yes (appropriate for developmental age) Does the patient have difficulty walking or climbing stairs?: Yes Weakness of Legs: Both Weakness of Arms/Hands: None  Permission Sought/Granted Permission sought to share information with : Case Manager Permission granted to share information with : Yes, Verbal Permission Granted  Share Information with NAME: Juliann Pulse  Permission granted to share info w AGENCY: [employer] and exercise psychologist granted to share info w Relationship: Daughter     Emotional Assessment       Orientation: : Oriented to Self, Oriented to Place, Oriented to  Time, Oriented to Situation Alcohol / Substance Use: Not Applicable Psych Involvement: No (comment)  Admission diagnosis:  Diverticulitis [K57.92] Acute diverticulitis [K57.92] Sepsis, due to unspecified organism, unspecified whether acute organ dysfunction present [facility]) [A41.9] Patient Active Problem List   Diagnosis Date Noted  . Diverticulitis 03/01/2020  . Pneumonia 07/17/2018  . Leg swelling 12/18/2015  . Chronic diastolic CHF (congestive heart failure) ([place]) 07/18/2015  . Essential hypertension 07/18/2015  . COPD (chronic obstructive pulmonary disease) ([place]) 03/28/2015  . Sleep apnea 03/28/2015  . SOB (shortness of breath) 03/28/2015   PCP:  Juluis Pitch, MD Pharmacy:   [facility] [place], [place] [address] [place] [address] Phone: [phone] Fax: [phone]     Social Determinants  of Health ([place]) Interventions    Readmission Risk Interventions Readmission Risk Prevention Plan 07/19/2018  Transportation Screening Complete  PCP or Specialist Appt within 5-7 Days Complete  Home Care Screening Complete  Medication Review (RN CM) Complete  Some recent data might be hidden

## 2020-03-03 NOTE — Evaluation (Signed)
Occupational Therapy Evaluation Patient Details Name: Trevor Ferguson MRN: [id number] DOB: [birth year] Today's Date: 03/03/2020    History of Present Illness Trevor Ferguson  is a 86 y.o. Caucasian male with a known history of COPD, hypertension, prostate CA sleep apnea and CVA, presented to the emergency room with acute onset of generalized weakness and fatigue with associated vomiting yesterday with presyncope. Is usually on oxygen at night for his COPD. He admitted to occasional paroxysmal nocturnal dyspnea as well as occasional orthopnea. CT abdomen concerning for acute diverticulitis.   Clinical Impression   Trevor Ferguson was seen for OT evaluation this date. Prior to hospital admission, pt was Independent c I/ADLs including driving. Pt lives alone at [place], wife has been at [facility] since Feb. Pt presents to acute OT demonstrating impaired ADL performance and functional mobility 2/2 decreased safety awareness, functional strength/ROM deficits, and decreased activity tolerance. Pt currently requires SBA to adjsut B socks seated EOB, MAX A ties shoes. SUPERVISION for ADL t/f - VC for seated rest breaks/PLB. Pt would benefit from skilled OT to address noted impairments and functional limitations (see below for any additional details) in order to maximize safety and independence while minimizing falls risk and caregiver burden. Upon hospital discharge, recommend HHOT to maximize pt safety and return to functional independence during meaningful occupations of daily life.     Follow Up Recommendations  Home health OT;Supervision - Intermittent    Equipment Recommendations  None recommended by OT    Recommendations for Other Services       Precautions / Restrictions Precautions Precautions: Fall Restrictions Weight Bearing Restrictions: No      Mobility Bed Mobility Overal bed mobility: Modified Independent             General bed mobility comments: HoB elevated and rails sup<>sit    Transfers Overall transfer level: Needs assistance   Transfers: Sit to/from Stand Sit to Stand: Supervision         General transfer comment: SUPERVISION + R rail sup<>sit x2    Balance Overall balance assessment: Needs assistance Sitting-balance support: No upper extremity supported;Feet supported Sitting balance-Leahy Scale: Good     Standing balance support: Single extremity supported;During functional activity Standing balance-Leahy Scale: Fair                             ADL either performed or assessed with clinical judgement   ADL Overall ADL's : Needs assistance/impaired                                       General ADL Comments: SBA to adjsut B socks seated EOB, MAX A ties shoes. SUPERVISION for ADL t/f - VC for seated rest breaks/PLB.      Vision Baseline Vision/History: Wears glasses Wears Glasses: At all times       Perception     Praxis      Pertinent Vitals/Pain Pain Assessment: No/denies pain     Hand Dominance Right   Extremity/Trunk Assessment Upper Extremity Assessment Upper Extremity Assessment: Overall WFL for tasks assessed   Lower Extremity Assessment Lower Extremity Assessment: Overall WFL for tasks assessed       Communication Communication Communication: No difficulties   Cognition Arousal/Alertness: Awake/alert Behavior During Therapy: WFL for tasks assessed/performed Overall Cognitive Status: Within Functional Limits for tasks assessed  General Comments  SpO2 87% on 2L [place] seated EOB - increase to 93% c PLB. Pt reports ambulating w/o O2 at home to toilet, OT aggreeable to trial standing w/o O2 - pt desat 76% on RA - resolved to 86% c 2L O2, continued to resolve to 93% c seated rest break and PLB. Pt educated on importance of O2    Exercises Exercises: Other exercises Other Exercises Other Exercises: Pt educated re: OT role, DME recs, d/c recs,  importance of O2, falls prevention, ECS, safe t/f technique Other Exercises: LBD, simulated toilet t/f, sup<>sit, sit<>stand, sitting/standing balance/tolerance, PLB, therapeutic listening    Shoulder Instructions      Home Living Family/patient expects to be discharged to:: Private residence Living Arrangements: Alone Available Help at Discharge: Personal care attendant;Available PRN/intermittently Type of Home: Independent living facility [facility]) Home Access: Level entry     Home Layout: One level     Bathroom Shower/Tub: Walk-in shower;Tub/shower unit   Bathroom Toilet: Standard Bathroom Accessibility: Yes   Home Equipment: [employer] - 4 wheels;Cane - single point;Shower seat;Grab bars - tub/shower   Additional Comments: Wife at SNF since Feb       Prior Functioning/Environment Level of Independence: Independent        Comments: Pt reports "near falls" and 1 fall this year/ Pt also endorses being on knees a lot 2/2 vomitting prior to admission - reports unable to rise from knees afterwards        OT Problem List: Decreased strength;Decreased activity tolerance;Decreased safety awareness;Decreased knowledge of use of DME or AE;Cardiopulmonary status limiting activity      OT Treatment/Interventions: Self-care/ADL training;Therapeutic exercise;Energy conservation;DME and/or AE instruction;Therapeutic activities;Patient/family education;Balance training    OT Goals(Current goals can be found in the care plan section) Acute Rehab OT Goals Patient Stated Goal: To return home OT Goal Formulation: With patient Time For Goal Achievement: 03/17/20 Potential to Achieve Goals: Good ADL Goals Pt Will Perform Lower Body Dressing: with min assist;sit to/from stand Pt Will Transfer to Toilet: with modified independence;ambulating Additional ADL Goal #1: Pt will Independently verbalize plan to implement x3 falls prevention strategies  OT Frequency: Min 1X/week   Barriers to  D/C: Decreased caregiver support          Co-evaluation              AM-PAC OT "6 Clicks" Daily Activity     Outcome Measure Help from another person eating meals?: None Help from another person taking care of personal grooming?: None Help from another person toileting, which includes using toliet, bedpan, or urinal?: A Little Help from another person bathing (including washing, rinsing, drying)?: A Little Help from another person to put on and taking off regular upper body clothing?: None Help from another person to put on and taking off regular lower body clothing?: A Lot 6 Click Score: 20   End of Session Equipment Utilized During Treatment: Oxygen Nurse Communication: Mobility status;Other (comment) (O2 status)  Activity Tolerance: Patient tolerated treatment well Patient left: in bed;with call bell/phone within reach;with nursing/sitter in room  OT Visit Diagnosis: Other abnormalities of gait and mobility (R26.89)                Time: 4259-5638 OT Time Calculation (min): 39 min Charges:  OT General Charges $OT Visit: 1 Visit OT Evaluation $OT Eval Moderate Complexity: 1 Mod OT Treatments $Self Care/Home Management : 8-22 mins $Therapeutic Activity: 8-22 mins  Dessie Coma, M.S. OTR/L  03/03/20, 5:10 PM

## 2020-03-04 DIAGNOSIS — K5792 Diverticulitis of intestine, part unspecified, without perforation or abscess without bleeding: Secondary | ICD-10-CM | POA: Diagnosis not present

## 2020-03-04 LAB — CBC
HCT: 29.1 % — ABNORMAL LOW (ref 39.0–52.0)
Hemoglobin: 10.2 g/dL — ABNORMAL LOW (ref 13.0–17.0)
MCH: 32.5 pg (ref 26.0–34.0)
MCHC: 35.1 g/dL (ref 30.0–36.0)
MCV: 92.7 fL (ref 80.0–100.0)
Platelets: 99 10*3/uL — ABNORMAL LOW (ref 150–400)
RBC: 3.14 MIL/uL — ABNORMAL LOW (ref 4.22–5.81)
RDW: 14.6 % (ref 11.5–15.5)
WBC: 9.9 10*3/uL (ref 4.0–10.5)
nRBC: 0 % (ref 0.0–0.2)

## 2020-03-04 LAB — BASIC METABOLIC PANEL
Anion gap: 6 (ref 5–15)
BUN: 35 mg/dL — ABNORMAL HIGH (ref 8–23)
CO2: 24 mmol/L (ref 22–32)
Calcium: 7.8 mg/dL — ABNORMAL LOW (ref 8.9–10.3)
Chloride: 104 mmol/L (ref 98–111)
Creatinine, Ser: 2.05 mg/dL — ABNORMAL HIGH (ref 0.61–1.24)
GFR calc Af Amer: 32 mL/min — ABNORMAL LOW (ref >60)
GFR calc non Af Amer: 27 mL/min — ABNORMAL LOW (ref >60)
Glucose, Bld: 99 mg/dL (ref 70–99)
Potassium: 4.2 mmol/L (ref 3.5–5.1)
Sodium: 134 mmol/L — ABNORMAL LOW (ref 135–145)

## 2020-03-04 MED ORDER — DM-GUAIFENESIN ER 30-600 MG PO TB12: 1.0000 | ORAL_TABLET | Freq: Two times a day (BID) | ORAL | 0 refills | Status: DC

## 2020-03-04 MED ORDER — ONDANSETRON HCL 4 MG PO TABS: 4.0000 mg | ORAL_TABLET | Freq: Four times a day (QID) | ORAL | 0 refills | Status: DC | PRN

## 2020-03-04 MED ORDER — SULFAMETHOXAZOLE-TRIMETHOPRIM 800-160 MG PO TABS
1.0000 | ORAL_TABLET | Freq: Two times a day (BID) | ORAL | 0 refills | Status: DC
Start: 2020-03-04 — End: 2020-03-13

## 2020-03-04 MED ORDER — METRONIDAZOLE 500 MG PO TABS
500.0000 mg | ORAL_TABLET | Freq: Three times a day (TID) | ORAL | 0 refills | Status: DC
Start: 2020-03-04 — End: 2020-03-13

## 2020-03-04 NOTE — Discharge Summary (Signed)
Physician Discharge Summary  Trevor Ferguson GEX:[id number] DOB: May 09, 1926 DOA: 03/01/2020  PCP: Trevor Pitch, MD  Admit date: 03/01/2020 Discharge date: 03/04/2020  Admitted From: Home Disposition: Home  Recommendations for Outpatient Follow-up:  1. Follow up with PCP in 1-2 weeks 2. Please obtain BMP/CBC in one week 3. Please follow up on the following pending results: None  Home Health: Yes Equipment/Devices: None Discharge Condition: Stable CODE STATUS:  Diet recommendation: Heart Healthy   Brief/Interim Summary: [name] a86 y.o.Caucasian malewith a known history of COPD, hypertension, prostate CA sleep apnea and CVA, presented to the emergency room with acute onset of generalized weakness and fatigue with associated vomiting yesterday with presyncope. Is been having abdominal discomfort since this morning and has been having loose bowel movements intermittently over the last 6 weeks. No melena or bright red bleeding per rectum. He denied any fever or chills. He had stool culture that grew E. coli and was given p.o. Cipro which she has been taking over the last 5 days. Is usually on oxygen at night for his COPD. He admitted to occasional paroxysmal nocturnal dyspnea as well as occasional orthopnea. CT abdomen concerning for acute diverticulitis.  Patient was recently treated with Cipro for concern of positive stool cultures with E. coli.  He was treated with aztreonam and Flagyl while in the hospital.  Patient did develop some diarrhea and tested for C. difficile colitis which was negative. Diarrhea was thought to be as a side effect of antibiotics.  Patient was discharged on Bactrim and Flagyl to complete a 7-day course. He was able to tolerate soft diet without any difficulty.  No  Vomiting. Patient lives alone in an independent living facility and his wife lives in the same facility on rehab site.  We obtain PT evaluation and they recommend home health services  which were ordered.  On presentation patient did had mildly increased oxygen requirement from his baseline of 2 L.  He had some edema around his ankles and was given IV Lasix.  That resulted in increase in his creatinine.  Started improving with discontinuation of Lasix, patient has an history of CKD stage IIIb.  He will need a close follow-up by PCP and nephrology.  Patient has an history of hypertension.  His blood pressure initially soft and then remained within goal while in the hospital so home dose of Hyzaar was held.  Blood pressure started going up before discharge and he can resume his home meds.  We will continue rest of his home meds and follow-up with his providers.  Discharge Diagnoses:  Active Problems:   Diverticulitis  Discharge Instructions  Discharge Instructions    Diet - low sodium heart healthy   Complete by: As directed    Discharge instructions   Complete by: As directed    It was pleasure taking care of you. You are being given antibiotics for 5 more days, please take it as directed. Keep your self well-hydrated and advance your diet slowly. You can resume your home medications and follow-up with your primary care provider.   Increase activity slowly   Complete by: As directed      Allergies as of 03/04/2020      Reactions   Penicillins Hives, Rash, Other (See Comments)   Rash on hands and blisters on ankles when 86 years old   Bee Venom Palpitations   INSECT BITES/STINGS   Darvon [propoxyphene] Nausea Only, Rash      Medication List    TAKE these medications  aspirin EC 81 MG tablet Take 1 tablet (81 mg total) by mouth daily.   dextromethorphan-guaiFENesin 30-600 MG 12hr tablet Commonly known as: MUCINEX DM Take 1 tablet by mouth 2 (two) times daily.   escitalopram 10 MG tablet Commonly known as: LEXAPRO Take 10 mg by mouth daily.   furosemide 20 MG tablet Commonly known as: LASIX Take 1 tablet (20 mg total) by mouth daily as needed. Use  sparingly. Take potassium when you take furosemide.   losartan-hydrochlorothiazide 100-25 MG tablet Commonly known as: HYZAAR Take 1 tablet by mouth daily.   metroNIDAZOLE 500 MG tablet Commonly known as: Flagyl Take 1 tablet (500 mg total) by mouth 3 (three) times daily for 5 days.   ondansetron 4 MG tablet Commonly known as: ZOFRAN Take 1 tablet (4 mg total) by mouth every 6 (six) hours as needed for nausea.   potassium chloride SA 20 MEQ tablet Commonly known as: KLOR-CON Take 1 tablet (20 mEq total) by mouth daily as needed. Take when you take the furosemide.   sulfamethoxazole-trimethoprim 800-160 MG tablet Commonly known as: BACTRIM DS Take 1 tablet by mouth 2 (two) times daily for 5 days.   traZODone 150 MG tablet Commonly known as: DESYREL Take 150 mg by mouth at bedtime.   triamcinolone 0.025 % cream Commonly known as: KENALOG Apply topically 2 (two) times daily.   Vitamin D-3 25 MCG (1000 UT) Caps Take 1,000 Units by mouth daily.       Follow-up Information    Trevor Pitch, MD. Schedule an appointment as soon as possible for a visit.   Specialty: Family Medicine Contact information: [address] [address] [phone]              Allergies  Allergen Reactions  . Penicillins Hives, Rash and Other (See Comments)    Rash on hands and blisters on ankles when 86 years old  . Bee Venom Palpitations    INSECT BITES/STINGS  . Darvon [Propoxyphene] Nausea Only and Rash    Consultations:  None  Procedures/Studies: CT ABDOMEN PELVIS WO CONTRAST  Result Date: 03/01/2020 CLINICAL DATA:  Nausea and vomiting, weakness, found down EXAM: CT ABDOMEN AND PELVIS WITHOUT CONTRAST TECHNIQUE: Multidetector CT imaging of the abdomen and pelvis was performed following the standard protocol without IV contrast. COMPARISON:  None. FINDINGS: Lower chest: There is bibasilar scarring and fibrosis. No acute pleural or parenchymal lung disease.  Hepatobiliary: No focal liver abnormality is seen. No gallstones, gallbladder wall thickening, or biliary dilatation. Pancreas: Unremarkable. No pancreatic ductal dilatation or surrounding inflammatory changes. Spleen: Normal in size without focal abnormality. Adrenals/Urinary Tract: Calcifications of the bilateral renal hila most consistent with vascular calcifications. No definite urinary tract calculi. No obstructive uropathy. The bladder is grossly normal. The adrenals are unremarkable. Stomach/Bowel: There is no bowel obstruction or ileus. Inflammatory changes surround a loop of small bowel within the central lower abdomen, reference axial image 56 through 61 of series 2, and image 21-35 of series 6. Overall, the appearance is most consistent with diverticulitis of the small bowel. No fluid collection or abscess. The appendix is surgically absent. Vascular/Lymphatic: Aortic atherosclerosis. No enlarged abdominal or pelvic lymph nodes. Reproductive: Radiotherapy seeds are seen within the prostate bed. Other: No free fluid or free intraperitoneal gas. No abdominal wall hernia. Musculoskeletal: No acute or destructive bony lesions. Reconstructed images demonstrate no additional findings. IMPRESSION: 1. Acute diverticulitis of the distal small bowel in the right lower quadrant, involving the distal jejunum or proximal  ileum. No perforation, fluid collection, or abscess. 2.  Aortic Atherosclerosis (ICD10-I70.0). Electronically Signed   By: Randa Ngo M.D.   On: 03/01/2020 20:20   DG Chest 2 View  Result Date: 03/01/2020 CLINICAL DATA:  Dyspnea, weakness, vomiting EXAM: CHEST - 2 VIEW COMPARISON:  08/10/2018 FINDINGS: The lungs are mildly hyperinflated in keeping with changes of underlying COPD. No confluent pulmonary infiltrate. No pneumothorax or pleural effusion. Interstitial edema noted on prior examination has resolved; the pulmonary vascularity is normal. Cardiac size is within normal limits. Osseous  structures are age-appropriate. IMPRESSION: No active cardiopulmonary disease.  COPD. Electronically Signed   By: Fidela Salisbury MD   On: 03/01/2020 20:25   ECHOCARDIOGRAM COMPLETE  Result Date: 03/02/2020    ECHOCARDIOGRAM REPORT   Patient Name:   Trevor Ferguson Date of Exam: 03/02/2020 Medical Rec #:  [id number]       Height:       70.0 in Accession #:    [id number]      Weight:       210.0 lb Date of Birth:  03-06-1926       BSA:          2.131 m Patient Age:    86 years        BP:           98/45 mmHg Patient Gender: M               HR:           62 bpm. Exam Location:  [facility] Procedure: 2D Echo, Cardiac Doppler and Color Doppler Indications:     CHFR- acute diastolic 742.59  History:         Patient has prior history of Echocardiogram examinations, most                  recent 09/04/2018. COPD and Stroke; Risk Factors:Hypertension.  Sonographer:     Sherrie Sport RDCS (AE) Referring Phys:  [id number] Arvella Merles MANSY Diagnosing Phys: Isaias Cowman MD IMPRESSIONS  1. Left ventricular ejection fraction, by estimation, is 60 to 65%. The left ventricle has normal function. The left ventricle has no regional wall motion abnormalities. Left ventricular diastolic parameters were normal.  2. Right ventricular systolic function is normal. The right ventricular size is normal. There is severely elevated pulmonary artery systolic pressure.  3. The mitral valve is normal in structure. Trivial mitral valve regurgitation. No evidence of mitral stenosis.  4. The aortic valve is normal in structure. Aortic valve regurgitation is not visualized. No aortic stenosis is present.  5. The inferior vena cava is normal in size with greater than 50% respiratory variability, suggesting right atrial pressure of 3 mmHg. FINDINGS  Left Ventricle: Left ventricular ejection fraction, by estimation, is 60 to 65%. The left ventricle has normal function. The left ventricle has no regional wall motion abnormalities. The left ventricular internal  cavity size was normal in size. There is  no left ventricular hypertrophy. Left ventricular diastolic parameters were normal. Right Ventricle: The right ventricular size is normal. No increase in right ventricular wall thickness. Right ventricular systolic function is normal. There is severely elevated pulmonary artery systolic pressure. The tricuspid regurgitant velocity is 3.56 m/s, and with an assumed right atrial pressure of 10 mmHg, the estimated right ventricular systolic pressure is 43.3 mmHg. Left Atrium: Left atrial size was normal in size. Right Atrium: Right atrial size was normal in size. Pericardium: There is no evidence of pericardial effusion. Mitral  Valve: The mitral valve is normal in structure. Normal mobility of the mitral valve leaflets. Trivial mitral valve regurgitation. No evidence of mitral valve stenosis. Tricuspid Valve: The tricuspid valve is normal in structure. Tricuspid valve regurgitation is trivial. No evidence of tricuspid stenosis. Aortic Valve: The aortic valve is normal in structure. Aortic valve regurgitation is not visualized. No aortic stenosis is present. Aortic valve mean gradient measures 5.0 mmHg. Aortic valve peak gradient measures 9.3 mmHg. Aortic valve area, by VTI measures 1.85 cm. Pulmonic Valve: The pulmonic valve was normal in structure. Pulmonic valve regurgitation is not visualized. No evidence of pulmonic stenosis. Aorta: The aortic root is normal in size and structure. Venous: The inferior vena cava is normal in size with greater than 50% respiratory variability, suggesting right atrial pressure of 3 mmHg. IAS/Shunts: No atrial level shunt detected by color flow Doppler.  LEFT VENTRICLE PLAX 2D LVIDd:         4.55 cm  Diastology LVIDs:         2.73 cm  LV e' lateral:   7.51 cm/s LV PW:         1.28 cm  LV E/e' lateral: 14.0 LV IVS:        0.80 cm  LV e' medial:    8.92 cm/s LVOT diam:     2.10 cm  LV E/e' medial:  11.8 LV SV:         63 LV SV Index:   30 LVOT  Area:     3.46 cm  RIGHT VENTRICLE RV Basal diam:  3.63 cm RV S prime:     16.90 cm/s TAPSE (M-mode): 6.2 cm LEFT ATRIUM           Index       RIGHT ATRIUM           Index LA diam:      3.80 cm 1.78 cm/m  RA Area:     28.00 cm LA Vol (A2C): 81.4 ml 38.20 ml/m RA Volume:   95.10 ml  44.63 ml/m LA Vol (A4C): 80.2 ml 37.63 ml/m  AORTIC VALVE                    PULMONIC VALVE AV Area (Vmax):    1.94 cm     PV Vmax:        0.81 m/s AV Area (Vmean):   1.69 cm     PV Peak grad:   2.6 mmHg AV Area (VTI):     1.85 cm     RVOT Peak grad: 3 mmHg AV Vmax:           152.33 cm/s AV Vmean:          108.000 cm/s AV VTI:            0.340 m AV Peak Grad:      9.3 mmHg AV Mean Grad:      5.0 mmHg LVOT Vmax:         85.50 cm/s LVOT Vmean:        52.600 cm/s LVOT VTI:          0.182 m LVOT/AV VTI ratio: 0.53  AORTA Ao Root diam: 3.70 cm MITRAL VALVE                TRICUSPID VALVE MV Area (PHT): 3.13 cm     TR Peak grad:   50.7 mmHg MV Decel Time: 242 msec     TR Vmax:  356.00 cm/s MV E velocity: 105.00 cm/s MV A velocity: 98.50 cm/s   SHUNTS MV E/A ratio:  1.07         Systemic VTI:  0.18 m                             Systemic Diam: 2.10 cm Isaias Cowman MD Electronically signed by Isaias Cowman MD Signature Date/Time: 03/02/2020/12:58:25 PM    Final     Subjective: Patient was feeling better when seen today.  Denies any pain.  Had 1 episode of loose bowel movement this morning.  No abdominal pain.  No nausea or vomiting.  Able to tolerate diet well.  Discharge Exam: Vitals:   03/04/20 1204 03/04/20 1237  BP:  134/71  Pulse:  81  Resp:  16  Temp:  98.8 F (37.1 C)  SpO2: (!) 83% 90%   Vitals:   03/04/20 0500 03/04/20 0755 03/04/20 1204 03/04/20 1237  BP: 135/90 (!) 131/55  134/71  Pulse: 78 75  81  Resp: 18 16  16   Temp: 98.5 F (36.9 C) 98.6 F (37 C)  98.8 F (37.1 C)  TempSrc: Oral     SpO2: 98% 95% (!) 83% 90%  Weight:      Height:        General: Pt is alert, awake, not in  acute distress Cardiovascular: RRR, S1/S2 +, no rubs, no gallops Respiratory: CTA bilaterally, no wheezing, no rhonchi Abdominal: Soft, NT, ND, bowel sounds + Extremities: no edema, no cyanosis   The results of significant diagnostics from this hospitalization (including imaging, microbiology, ancillary and laboratory) are listed below for reference.    Microbiology: Recent Results (from the past 240 hour(s))  SARS Coronavirus 2 by RT PCR (hospital order, performed in [facility] hospital lab) Nasopharyngeal Nasopharyngeal Swab     Status: None   Collection Time: 03/01/20  8:53 PM   Specimen: Nasopharyngeal Swab  Result Value Ref Range Status   SARS Coronavirus 2 NEGATIVE NEGATIVE Final    Comment: (NOTE) SARS-CoV-2 target nucleic acids are NOT DETECTED.  The SARS-CoV-2 RNA is generally detectable in upper and lower respiratory specimens during the acute phase of infection. The lowest concentration of SARS-CoV-2 viral copies this assay can detect is 250 copies / mL. A negative result does not preclude SARS-CoV-2 infection and should not be used as the sole basis for treatment or other patient management decisions.  A negative result may occur with improper specimen collection / handling, submission of specimen other than nasopharyngeal swab, presence of viral mutation(s) within the areas targeted by this assay, and inadequate number of viral copies (<250 copies / mL). A negative result must be combined with clinical observations, patient history, and epidemiological information.  Fact Sheet for Patients:   StrictlyIdeas.no  Fact Sheet for Healthcare Providers: BankingDealers.co.za  This test is not yet approved or  cleared by the [place] FDA and has been authorized for detection and/or diagnosis of SARS-CoV-2 by FDA under an Emergency Use Authorization (EUA).  This EUA will remain in effect (meaning this test can be used) for  the duration of the COVID-19 declaration under Section 564(b)(1) of the Act, 21 U.S.C. section 360bbb-3(b)(1), unless the authorization is terminated or revoked sooner.  Performed at [facility], [address] [place], [place] [address]   Blood Culture (routine x 2)     Status: None (Preliminary result)   Collection Time: 03/01/20  8:53 PM  Specimen: BLOOD  Result Value Ref Range Status   Specimen Description BLOOD BLOOD LEFT WRIST  Final   Special Requests   Final    BOTTLES DRAWN AEROBIC AND ANAEROBIC Blood Culture results may not be optimal due to an inadequate volume of blood received in culture bottles   Culture   Final    NO GROWTH 3 DAYS Performed at [facility], [address], [place], [place] [address]    Report Status PENDING  Incomplete  Blood Culture (routine x 2)     Status: None (Preliminary result)   Collection Time: 03/01/20  8:53 PM   Specimen: BLOOD  Result Value Ref Range Status   Specimen Description BLOOD RIGHT ANTECUBITAL  Final   Special Requests   Final    BOTTLES DRAWN AEROBIC AND ANAEROBIC Blood Culture adequate volume   Culture   Final    NO GROWTH 3 DAYS Performed at [facility], [address] [place], [place] [address]    Report Status PENDING  Incomplete  C Difficile Quick Screen w PCR reflex     Status: None   Collection Time: 03/03/20  6:13 PM   Specimen: STOOL  Result Value Ref Range Status   C Diff antigen NEGATIVE NEGATIVE Final   C Diff toxin NEGATIVE NEGATIVE Final   C Diff interpretation No C. difficile detected.  Final    Comment: Performed at [facility], [address] [place], [place] [address]     Labs: BNP (last 3 results) Recent Labs    03/01/20 1907  BNP 127.5*   Basic Metabolic Panel: Recent Labs  Lab 03/01/20 1904 03/02/20 0621 03/03/20 0420 03/04/20 0338  NA 138 137 133* 134*  K 3.7 3.7 4.4 4.2  CL 107 104 103 104  CO2 21* 24 20* 24  GLUCOSE 147* 127* 107* 99   BUN 34* 39* 38* 35*  CREATININE 1.75* 1.95* 2.34* 2.05*  CALCIUM 9.0 8.3* 7.6* 7.8*   Liver Function Tests: Recent Labs  Lab 03/01/20 1907  AST 17  ALT 12  ALKPHOS 60  BILITOT 1.4*  PROT 6.4*  ALBUMIN 4.0   Recent Labs  Lab 03/01/20 1907  LIPASE 36   No results for input(s): AMMONIA in the last 168 hours. CBC: Recent Labs  Lab 03/01/20 1904 03/02/20 0621 03/03/20 0420 03/04/20 0338  WBC 15.8* 20.2* 13.6* 9.9  HGB 12.5* 11.1* 10.0* 10.2*  HCT 35.3* 31.2* 27.9* 29.1*  MCV 90.5 90.2 91.2 92.7  PLT 139* 126* 106* 99*   Cardiac Enzymes: No results for input(s): CKTOTAL, CKMB, CKMBINDEX, TROPONINI in the last 168 hours. BNP: Invalid input(s): POCBNP CBG: No results for input(s): GLUCAP in the last 168 hours. D-Dimer No results for input(s): DDIMER in the last 72 hours. Hgb A1c No results for input(s): HGBA1C in the last 72 hours. Lipid Profile No results for input(s): CHOL, HDL, LDLCALC, TRIG, CHOLHDL, LDLDIRECT in the last 72 hours. Thyroid function studies Recent Labs    03/02/20 0621  TSH 0.657   Anemia work up No results for input(s): VITAMINB12, FOLATE, FERRITIN, TIBC, IRON, RETICCTPCT in the last 72 hours. Urinalysis    Component Value Date/Time   COLORURINE YELLOW (A) 03/02/2020 1420   APPEARANCEUR HAZY (A) 03/02/2020 1420   APPEARANCEUR Clear 03/19/2014 1049   LABSPEC 1.017 03/02/2020 1420   LABSPEC 1.016 03/19/2014 1049   PHURINE 5.0 03/02/2020 1420   GLUCOSEU NEGATIVE 03/02/2020 1420   GLUCOSEU Negative 03/19/2014 1049   HGBUR NEGATIVE 03/02/2020 1420  BILIRUBINUR NEGATIVE 03/02/2020 1420   BILIRUBINUR Negative 03/19/2014 1049   KETONESUR NEGATIVE 03/02/2020 1420   PROTEINUR 30 (A) 03/02/2020 1420   NITRITE NEGATIVE 03/02/2020 1420   LEUKOCYTESUR NEGATIVE 03/02/2020 1420   LEUKOCYTESUR Negative 03/19/2014 1049   Sepsis Labs Invalid input(s): PROCALCITONIN,  WBC,  LACTICIDVEN Microbiology Recent Results (from the past 240 hour(s))   SARS Coronavirus 2 by RT PCR (hospital order, performed in [facility] hospital lab) Nasopharyngeal Nasopharyngeal Swab     Status: None   Collection Time: 03/01/20  8:53 PM   Specimen: Nasopharyngeal Swab  Result Value Ref Range Status   SARS Coronavirus 2 NEGATIVE NEGATIVE Final    Comment: (NOTE) SARS-CoV-2 target nucleic acids are NOT DETECTED.  The SARS-CoV-2 RNA is generally detectable in upper and lower respiratory specimens during the acute phase of infection. The lowest concentration of SARS-CoV-2 viral copies this assay can detect is 250 copies / mL. A negative result does not preclude SARS-CoV-2 infection and should not be used as the sole basis for treatment or other patient management decisions.  A negative result may occur with improper specimen collection / handling, submission of specimen other than nasopharyngeal swab, presence of viral mutation(s) within the areas targeted by this assay, and inadequate number of viral copies (<250 copies / mL). A negative result must be combined with clinical observations, patient history, and epidemiological information.  Fact Sheet for Patients:   StrictlyIdeas.no  Fact Sheet for Healthcare Providers: BankingDealers.co.za  This test is not yet approved or  cleared by the [place] FDA and has been authorized for detection and/or diagnosis of SARS-CoV-2 by FDA under an Emergency Use Authorization (EUA).  This EUA will remain in effect (meaning this test can be used) for the duration of the COVID-19 declaration under Section 564(b)(1) of the Act, 21 U.S.C. section 360bbb-3(b)(1), unless the authorization is terminated or revoked sooner.  Performed at [facility], [address] [place], [place] [address]   Blood Culture (routine x 2)     Status: None (Preliminary result)   Collection Time: 03/01/20  8:53 PM   Specimen: BLOOD  Result Value Ref Range Status    Specimen Description BLOOD BLOOD LEFT WRIST  Final   Special Requests   Final    BOTTLES DRAWN AEROBIC AND ANAEROBIC Blood Culture results may not be optimal due to an inadequate volume of blood received in culture bottles   Culture   Final    NO GROWTH 3 DAYS Performed at [facility], [address], [place], [place] [address]    Report Status PENDING  Incomplete  Blood Culture (routine x 2)     Status: None (Preliminary result)   Collection Time: 03/01/20  8:53 PM   Specimen: BLOOD  Result Value Ref Range Status   Specimen Description BLOOD RIGHT ANTECUBITAL  Final   Special Requests   Final    BOTTLES DRAWN AEROBIC AND ANAEROBIC Blood Culture adequate volume   Culture   Final    NO GROWTH 3 DAYS Performed at [facility], [address] [place],  [address]    Report Status PENDING  Incomplete  C Difficile Quick Screen w PCR reflex     Status: None   Collection Time: 03/03/20  6:13 PM   Specimen: STOOL  Result Value Ref Range Status   C Diff antigen NEGATIVE NEGATIVE Final   C Diff toxin NEGATIVE NEGATIVE Final   C Diff interpretation No C. difficile detected.  Final  Comment: Performed at [facility], [address], [place], [place] [address]    Time coordinating discharge: Over 30 minutes  SIGNED:  Lorella Nimrod, MD  [facility] 03/04/2020, 2:23 PM  If 7PM-7AM, please contact night-coverage [email or web address]  This record has been created using Systems analyst. Errors have been sought and corrected,but may not always be located. Such creation errors do not reflect on the standard of care.

## 2020-03-04 NOTE — Evaluation (Signed)
Physical Therapy Evaluation Patient Details Name: Trevor Ferguson MRN: [id number] DOB: Oct 10, 1925 Today's Date: 03/04/2020   History of Present Illness  Trevor Ferguson is a 86 y.o. Caucasian male with a known history of COPD, hypertension, prostate CA sleep apnea and CVA, presented to the emergency room with acute onset of generalized weakness and fatigue with associated vomiting yesterday with presyncope. CT abdomen concerning for acute diverticulitis.  Clinical Impression  Pt is a pleasant 86 year old M who was admitted for diverticulitis, with PMH as described above. Pt lives in [place] and was independent with ADLs and ambulating without AD prior to admission; of note, also receives PT 2x/week for balance.  Pt encountered supine in bed with nasal cannula removed (pt states he removed to blow his nose), with O2 sat of 83%; returned [place] and educated pt on need for O2 to maintain O2 sat >92%. With [place] (3L) donned, O2 sat remaining >92% remainder of session with mobility tasks. Pt performs bed mobility, transfers, and ambulation with supervision. Pt performs supine<>sit with HOB elevated with supervision. Pt ambulates without AD with 3L O2 with supervision in-room approx 75' demonstrating overall normal gait pattern. Pt able to perform standing balance activities (static and dynamic) including tandem stance, feet together (eyes open/closed) and marching with CGA and only mild difficulty.  Pt demonstrates deficits with strength and balance requiring continued skilled PT intervention to promote safe functional mobility and discharge.     Follow Up Recommendations Home health PT (pt states already receiving PT 2x/week for balance)    Equipment Recommendations       Recommendations for Other Services       Precautions / Restrictions Precautions Precautions: Fall Restrictions Weight Bearing Restrictions: No      Mobility  Bed Mobility Overal bed mobility: Modified Independent              General bed mobility comments: HoB elevated, supine<>sit  Transfers Overall transfer level: Needs assistance   Transfers: Sit to/from Stand Sit to Stand: Supervision         General transfer comment: STS from bedside, and from standard toilet seat height (pulling up with handrail)  Ambulation/Gait Ambulation/Gait assistance: Supervision Gait Distance (Feet): 75 Feet Assistive device: None Gait Pattern/deviations: WFL(Within Functional Limits)     General Gait Details: generally safe without AD; able to perform standing dynamic/static balance activities with CGA, demonstrating mild difficulty with tandem stance  Stairs            Wheelchair Mobility    Modified Rankin (Stroke Patients Only)       Balance Overall balance assessment: Needs assistance Sitting-balance support: No upper extremity supported;Feet supported Sitting balance-Leahy Scale: Good     Standing balance support: During functional activity;No upper extremity supported Standing balance-Leahy Scale: Good Standing balance comment: able to perform standing dynamic/static balance activities with CGA (feet together, EC; tandem stance, marching), demonstrating mild difficulty with tandem stance                             Pertinent Vitals/Pain Pain Assessment: No/denies pain    Home Living Family/patient expects to be discharged to:: Other (Comment) ([place]) Living Arrangements: Alone Available Help at Discharge: Personal care attendant;Available PRN/intermittently Type of Home: Independent living facility Home Access: Level entry     Home Layout: One level Home Equipment: Mathe - 4 wheels;Cane - single point;Shower seat;Grab bars - tub/shower;[place] - 2 wheels (rw &  rollator) Additional Comments: Wife at SNF since Feb     Prior Function Level of Independence: Independent         Comments: Pt reports "near falls" and 1 fall this year/ Pt also endorses being on  knees a lot 2/2 vomitting prior to admission - reports unable to rise from knees afterwards     Hand Dominance        Extremity/Trunk Assessment   Upper Extremity Assessment Upper Extremity Assessment: Overall WFL for tasks assessed    Lower Extremity Assessment Lower Extremity Assessment: Overall WFL for tasks assessed    Cervical / Trunk Assessment Cervical / Trunk Assessment: Normal  Communication   Communication: No difficulties  Cognition Arousal/Alertness: Awake/alert Behavior During Therapy: WFL for tasks assessed/performed Overall Cognitive Status: Within Functional Limits for tasks assessed                                        General Comments      Exercises     Assessment/Plan    PT Assessment Patient needs continued PT services  PT Problem List Decreased mobility;Decreased safety awareness;Decreased activity tolerance;Decreased balance       PT Treatment Interventions Therapeutic exercise;Gait training;Balance training;Stair training;Neuromuscular re-education;Functional mobility training;Cognitive remediation;Therapeutic activities;Patient/family education    PT Goals (Current goals can be found in the Care Plan section)  Acute Rehab PT Goals Patient Stated Goal: To return home PT Goal Formulation: With patient Time For Goal Achievement: 03/18/20 Potential to Achieve Goals: Good    Frequency Min 2X/week   Barriers to discharge        Co-evaluation               AM-PAC PT "6 Clicks" Mobility  Outcome Measure Help needed turning from your back to your side while in a flat bed without using bedrails?: A Little Help needed moving from lying on your back to sitting on the side of a flat bed without using bedrails?: A Little Help needed moving to and from a bed to a chair (including a wheelchair)?: A Little Help needed standing up from a chair using your arms (e.g., wheelchair or bedside chair)?: A Little Help needed to walk  in hospital room?: A Little Help needed climbing 3-5 steps with a railing? : A Little 6 Click Score: 18    End of Session Equipment Utilized During Treatment: Gait belt;Oxygen Activity Tolerance: Patient tolerated treatment well Patient left: in bed;with bed alarm set;with call bell/phone within reach Nurse Communication: Mobility status PT Visit Diagnosis: Unsteadiness on feet (R26.81);Other abnormalities of gait and mobility (R26.89);Muscle weakness (generalized) (M62.81);History of falling (Z91.81)    Time: 0915-1000 PT Time Calculation (min) (ACUTE ONLY): 45 min   Charges:   PT Evaluation $PT Eval Moderate Complexity: 1 Mod PT Treatments $Therapeutic Activity: 8-22 mins        Petra Kuba, PT, DPT 03/04/20, 12:31 PM

## 2020-03-04 NOTE — TOC Transition Note (Signed)
Transition of Care [facility]) - CM/SW Discharge Note   Patient Details  Name: Trevor Ferguson MRN: [id number] Date of Birth: 1926-06-19  [facility] of [facility] CM/SW Contact:  Boris Sharper, LCSW Phone Number: 03/04/2020, 2:21 PM   Clinical Narrative:    Pt medically stable for discharge per MD. Pt will be transported to his home at [facility] by his daughter. Pt received PT through [facility] and will continue upon discharge.   Final next level of care: Home w Home Health Services Barriers to Discharge: No Barriers Identified   Patient Goals and CMS Choice   CMS Medicare.gov Compare Post Acute Care list provided to:: Patient Choice offered to / list presented to : Patient  Discharge Placement                Patient to be transferred to facility by: Daughter Name of family member notified: Juliann Pulse Patient and family notified of of transfer: 03/04/20  Discharge Plan and Services   Discharge Planning Services: CM Consult Post Acute Care Choice: [place]                               Social Determinants of Health (SDOH) Interventions     Readmission Risk Interventions Readmission Risk Prevention Plan 07/19/2018  Transportation Screening Complete  PCP or Specialist Appt within 5-7 Days Complete  Home Care Screening Complete  Medication Review (RN CM) Complete  Some recent data might be hidden

## 2020-03-06 ENCOUNTER — Emergency Department: Payer: Medicare PPO

## 2020-03-06 ENCOUNTER — Inpatient Hospital Stay (HOSPITAL_COMMUNITY)
Admission: EM | Admit: 2020-03-06 | Discharge: 2020-03-13 | Disposition: A | Discharge status: Skilled Nursing Facility | Payer: Medicare PPO | Source: Home / Self Care | Attending: Internal Medicine | Admitting: Internal Medicine

## 2020-03-06 ENCOUNTER — Other Ambulatory Visit: Payer: Self-pay

## 2020-03-06 DIAGNOSIS — G4730 Sleep apnea, unspecified: Secondary | ICD-10-CM | POA: Diagnosis present

## 2020-03-06 DIAGNOSIS — Z87891 Personal history of nicotine dependence: Secondary | ICD-10-CM

## 2020-03-06 DIAGNOSIS — R531 Weakness: Secondary | ICD-10-CM

## 2020-03-06 DIAGNOSIS — K649 Unspecified hemorrhoids: Secondary | ICD-10-CM | POA: Diagnosis not present

## 2020-03-06 DIAGNOSIS — D649 Anemia, unspecified: Secondary | ICD-10-CM | POA: Diagnosis present

## 2020-03-06 DIAGNOSIS — I130 Hypertensive heart and chronic kidney disease with heart failure and stage 1 through stage 4 chronic kidney disease, or unspecified chronic kidney disease: Secondary | ICD-10-CM | POA: Diagnosis present

## 2020-03-06 DIAGNOSIS — D631 Anemia in chronic kidney disease: Secondary | ICD-10-CM | POA: Diagnosis present

## 2020-03-06 DIAGNOSIS — K5712 Diverticulitis of small intestine without perforation or abscess without bleeding: Secondary | ICD-10-CM | POA: Diagnosis present

## 2020-03-06 DIAGNOSIS — J449 Chronic obstructive pulmonary disease, unspecified: Secondary | ICD-10-CM | POA: Diagnosis present

## 2020-03-06 DIAGNOSIS — J9601 Acute respiratory failure with hypoxia: Secondary | ICD-10-CM | POA: Diagnosis present

## 2020-03-06 DIAGNOSIS — I5032 Chronic diastolic (congestive) heart failure: Secondary | ICD-10-CM | POA: Diagnosis present

## 2020-03-06 DIAGNOSIS — Z8249 Family history of ischemic heart disease and other diseases of the circulatory system: Secondary | ICD-10-CM

## 2020-03-06 DIAGNOSIS — Z7289 Other problems related to lifestyle: Secondary | ICD-10-CM

## 2020-03-06 DIAGNOSIS — Z8546 Personal history of malignant neoplasm of prostate: Secondary | ICD-10-CM

## 2020-03-06 DIAGNOSIS — B9620 Unspecified Escherichia coli [E. coli] as the cause of diseases classified elsewhere: Secondary | ICD-10-CM | POA: Diagnosis present

## 2020-03-06 DIAGNOSIS — R21 Rash and other nonspecific skin eruption: Secondary | ICD-10-CM | POA: Diagnosis not present

## 2020-03-06 DIAGNOSIS — Z8673 Personal history of transient ischemic attack (TIA), and cerebral infarction without residual deficits: Secondary | ICD-10-CM

## 2020-03-06 DIAGNOSIS — Z7982 Long term (current) use of aspirin: Secondary | ICD-10-CM

## 2020-03-06 DIAGNOSIS — N179 Acute kidney failure, unspecified: Secondary | ICD-10-CM | POA: Diagnosis present

## 2020-03-06 DIAGNOSIS — R109 Unspecified abdominal pain: Secondary | ICD-10-CM | POA: Diagnosis present

## 2020-03-06 DIAGNOSIS — Z79891 Long term (current) use of opiate analgesic: Secondary | ICD-10-CM

## 2020-03-06 DIAGNOSIS — N189 Chronic kidney disease, unspecified: Secondary | ICD-10-CM

## 2020-03-06 DIAGNOSIS — Z66 Do not resuscitate: Secondary | ICD-10-CM | POA: Diagnosis present

## 2020-03-06 DIAGNOSIS — F329 Major depressive disorder, single episode, unspecified: Secondary | ICD-10-CM | POA: Diagnosis present

## 2020-03-06 DIAGNOSIS — Z9981 Dependence on supplemental oxygen: Secondary | ICD-10-CM

## 2020-03-06 DIAGNOSIS — Z885 Allergy status to narcotic agent status: Secondary | ICD-10-CM

## 2020-03-06 DIAGNOSIS — I10 Essential (primary) hypertension: Secondary | ICD-10-CM | POA: Diagnosis present

## 2020-03-06 DIAGNOSIS — Z20822 Contact with and (suspected) exposure to covid-19: Secondary | ICD-10-CM | POA: Diagnosis present

## 2020-03-06 DIAGNOSIS — Z825 Family history of asthma and other chronic lower respiratory diseases: Secondary | ICD-10-CM

## 2020-03-06 DIAGNOSIS — J441 Chronic obstructive pulmonary disease with (acute) exacerbation: Secondary | ICD-10-CM | POA: Diagnosis present

## 2020-03-06 DIAGNOSIS — Z880 Allergy status to penicillin: Secondary | ICD-10-CM

## 2020-03-06 DIAGNOSIS — F32A Depression, unspecified: Secondary | ICD-10-CM

## 2020-03-06 DIAGNOSIS — K5792 Diverticulitis of intestine, part unspecified, without perforation or abscess without bleeding: Secondary | ICD-10-CM

## 2020-03-06 DIAGNOSIS — Z9119 Patient's noncompliance with other medical treatment and regimen: Secondary | ICD-10-CM

## 2020-03-06 DIAGNOSIS — Z79899 Other long term (current) drug therapy: Secondary | ICD-10-CM

## 2020-03-06 DIAGNOSIS — Z91030 Bee allergy status: Secondary | ICD-10-CM

## 2020-03-06 DIAGNOSIS — N1832 Chronic kidney disease, stage 3b: Secondary | ICD-10-CM | POA: Diagnosis present

## 2020-03-06 LAB — COMPREHENSIVE METABOLIC PANEL
ALT: 12 U/L (ref 0–44)
AST: 17 U/L (ref 15–41)
Albumin: 3.7 g/dL (ref 3.5–5.0)
Alkaline Phosphatase: 59 U/L (ref 38–126)
Anion gap: 8 (ref 5–15)
BUN: 38 mg/dL — ABNORMAL HIGH (ref 8–23)
CO2: 23 mmol/L (ref 22–32)
Calcium: 8.4 mg/dL — ABNORMAL LOW (ref 8.9–10.3)
Chloride: 103 mmol/L (ref 98–111)
Creatinine, Ser: 2.37 mg/dL — ABNORMAL HIGH (ref 0.61–1.24)
GFR calc Af Amer: 27 mL/min — ABNORMAL LOW (ref >60)
GFR calc non Af Amer: 23 mL/min — ABNORMAL LOW (ref >60)
Glucose, Bld: 154 mg/dL — ABNORMAL HIGH (ref 70–99)
Potassium: 4.2 mmol/L (ref 3.5–5.1)
Sodium: 134 mmol/L — ABNORMAL LOW (ref 135–145)
Total Bilirubin: 0.6 mg/dL (ref 0.3–1.2)
Total Protein: 6.8 g/dL (ref 6.5–8.1)

## 2020-03-06 LAB — SARS CORONAVIRUS 2 BY RT PCR (HOSPITAL ORDER, PERFORMED IN ~~LOC~~ HOSPITAL LAB): SARS Coronavirus 2: NEGATIVE

## 2020-03-06 LAB — URINALYSIS, COMPLETE (UACMP) WITH MICROSCOPIC
Bacteria, UA: NONE SEEN
Bilirubin Urine: NEGATIVE
Glucose, UA: NEGATIVE mg/dL
Hgb urine dipstick: NEGATIVE
Ketones, ur: NEGATIVE mg/dL
Nitrite: NEGATIVE
Protein, ur: 30 mg/dL — AB
Specific Gravity, Urine: 1.014 (ref 1.005–1.030)
pH: 5 (ref 5.0–8.0)

## 2020-03-06 LAB — CBC
HCT: 34.8 % — ABNORMAL LOW (ref 39.0–52.0)
Hemoglobin: 11.8 g/dL — ABNORMAL LOW (ref 13.0–17.0)
MCH: 31.8 pg (ref 26.0–34.0)
MCHC: 33.9 g/dL (ref 30.0–36.0)
MCV: 93.8 fL (ref 80.0–100.0)
Platelets: 140 10*3/uL — ABNORMAL LOW (ref 150–400)
RBC: 3.71 MIL/uL — ABNORMAL LOW (ref 4.22–5.81)
RDW: 14.2 % (ref 11.5–15.5)
WBC: 9.7 10*3/uL (ref 4.0–10.5)
nRBC: 0 % (ref 0.0–0.2)

## 2020-03-06 LAB — FERRITIN: Ferritin: 101 ng/mL (ref 24–336)

## 2020-03-06 LAB — CULTURE, BLOOD (ROUTINE X 2)
Culture: NO GROWTH
Culture: NO GROWTH
Special Requests: ADEQUATE

## 2020-03-06 LAB — IRON AND TIBC
Iron: 32 ug/dL — ABNORMAL LOW (ref 45–182)
Saturation Ratios: 12 % — ABNORMAL LOW (ref 17.9–39.5)
TIBC: 266 ug/dL (ref 250–450)
UIBC: 234 ug/dL

## 2020-03-06 LAB — TSH: TSH: 1.355 u[IU]/mL (ref 0.350–4.500)

## 2020-03-06 LAB — LIPASE, BLOOD: Lipase: 31 U/L (ref 11–51)

## 2020-03-06 LAB — BRAIN NATRIURETIC PEPTIDE: B Natriuretic Peptide: 445.3 pg/mL — ABNORMAL HIGH (ref 0.0–100.0)

## 2020-03-06 LAB — FOLATE: Folate: 15.5 ng/mL (ref >5.9)

## 2020-03-06 LAB — T4, FREE: Free T4: 0.84 ng/dL (ref 0.61–1.12)

## 2020-03-06 MED ORDER — ESCITALOPRAM OXALATE 10 MG PO TABS
10.0000 mg | ORAL_TABLET | Freq: Every day | ORAL | Status: DC
  Administered 2020-03-06 – 2020-03-13 (×8): 10 mg via ORAL
  Filled 2020-03-06 (×8): qty 1

## 2020-03-06 MED ORDER — SODIUM CHLORIDE 0.9 % IV SOLN
1.0000 g | Freq: Three times a day (TID) | INTRAVENOUS | Status: DC
  Administered 2020-03-06: 1 g via INTRAVENOUS
  Filled 2020-03-06 (×4): qty 1

## 2020-03-06 MED ORDER — SODIUM CHLORIDE 0.9 % IV SOLN
1.0000 g | Freq: Three times a day (TID) | INTRAVENOUS | Status: DC
  Administered 2020-03-07: 1 g via INTRAVENOUS
  Filled 2020-03-06 (×3): qty 1

## 2020-03-06 MED ORDER — TRAZODONE HCL 50 MG PO TABS
150.0000 mg | ORAL_TABLET | Freq: Every day | ORAL | Status: DC
  Administered 2020-03-06 – 2020-03-12 (×7): 150 mg via ORAL
  Filled 2020-03-06 (×7): qty 1

## 2020-03-06 MED ORDER — METRONIDAZOLE IN NACL 5-0.79 MG/ML-% IV SOLN
500.0000 mg | Freq: Once | INTRAVENOUS | Status: AC
  Administered 2020-03-06: 500 mg via INTRAVENOUS
  Filled 2020-03-06: qty 100

## 2020-03-06 MED ORDER — FENTANYL CITRATE (PF) 100 MCG/2ML IJ SOLN
50.0000 ug | Freq: Once | INTRAMUSCULAR | Status: AC
  Administered 2020-03-06: 50 ug via INTRAVENOUS
  Filled 2020-03-06: qty 2

## 2020-03-06 MED ORDER — PANTOPRAZOLE SODIUM 40 MG IV SOLR
40.0000 mg | Freq: Every day | INTRAVENOUS | Status: DC
  Administered 2020-03-06 – 2020-03-09 (×4): 40 mg via INTRAVENOUS
  Filled 2020-03-06 (×4): qty 40

## 2020-03-06 MED ORDER — METRONIDAZOLE IN NACL 5-0.79 MG/ML-% IV SOLN
500.0000 mg | Freq: Three times a day (TID) | INTRAVENOUS | Status: DC
  Administered 2020-03-07 – 2020-03-08 (×4): 500 mg via INTRAVENOUS
  Filled 2020-03-06 (×4): qty 100

## 2020-03-06 MED ORDER — ACETAMINOPHEN 325 MG PO TABS
650.0000 mg | ORAL_TABLET | Freq: Four times a day (QID) | ORAL | Status: DC | PRN
  Administered 2020-03-07 – 2020-03-08 (×2): 650 mg via ORAL
  Filled 2020-03-06 (×2): qty 2

## 2020-03-06 MED ORDER — ASPIRIN EC 81 MG PO TBEC
81.0000 mg | DELAYED_RELEASE_TABLET | Freq: Every day | ORAL | Status: DC
  Administered 2020-03-06 – 2020-03-13 (×8): 81 mg via ORAL
  Filled 2020-03-06 (×8): qty 1

## 2020-03-06 MED ORDER — HYDRALAZINE HCL 20 MG/ML IJ SOLN
10.0000 mg | INTRAMUSCULAR | Status: DC | PRN
  Administered 2020-03-10 – 2020-03-11 (×2): 10 mg via INTRAVENOUS
  Filled 2020-03-06 (×2): qty 1

## 2020-03-06 MED ORDER — ACETAMINOPHEN 650 MG RE SUPP: 650.0000 mg | Freq: Four times a day (QID) | RECTAL | Status: DC | PRN

## 2020-03-06 MED ORDER — HEPARIN SODIUM (PORCINE) 5000 UNIT/ML IJ SOLN
5000.0000 [IU] | Freq: Three times a day (TID) | INTRAMUSCULAR | Status: DC
  Administered 2020-03-07 – 2020-03-13 (×19): 5000 [IU] via SUBCUTANEOUS
  Filled 2020-03-06 (×19): qty 1

## 2020-03-06 MED ORDER — VITAMIN D 25 MCG (1000 UNIT) PO TABS
1000.0000 [IU] | ORAL_TABLET | Freq: Every day | ORAL | Status: DC
  Administered 2020-03-07 – 2020-03-13 (×7): 1000 [IU] via ORAL
  Filled 2020-03-06 (×7): qty 1

## 2020-03-06 MED ORDER — BACID PO TABS
2.0000 | ORAL_TABLET | Freq: Three times a day (TID) | ORAL | Status: DC
  Filled 2020-03-06 (×3): qty 2

## 2020-03-06 MED ORDER — ONDANSETRON HCL 4 MG/2ML IJ SOLN
4.0000 mg | Freq: Once | INTRAMUSCULAR | Status: AC
  Administered 2020-03-06: 4 mg via INTRAVENOUS
  Filled 2020-03-06: qty 2

## 2020-03-06 MED ORDER — SODIUM CHLORIDE 0.9 % IV SOLN: INTRAVENOUS | Status: DC

## 2020-03-06 NOTE — ED Triage Notes (Signed)
Pt comes POV from [facility] with bloating, tenderness in his abdomen. Pt states this has been going on for several months. Has GI doc.

## 2020-03-06 NOTE — ED Provider Notes (Signed)
CT abd/pel shows slight interval worsening of diverticulitis. CXR shows questionable PNA/edema. Patient states he has a chronic cough that has been present for the past 35 years and denies that it is any worse than normal. At this time have lower suspicion for pneumonia. Will restart IV abx for diverticulitis. Discussed with hospitalist for admission.   Nance Pear, MD 03/06/20 2250

## 2020-03-06 NOTE — ED Notes (Signed)
Report called to [place] rn  In cpod

## 2020-03-06 NOTE — ED Notes (Signed)
Pt reports he has abd pain.  Pt was discharged from [place] yesterday and returns from [facility] today with increased pain.  Pt has diarrhea and abd distention.  Pt is on 3 liters oxygen.  Pt alert  Speech clear.

## 2020-03-06 NOTE — H&P (Signed)
History and Physical    Trevor Ferguson IRW:[id number] DOB: [birth year] DOA: 03/06/2020   PCP: Juluis Pitch, MD   Patient coming from: Home  Chief Complaint:  Abdominal pain  HPI: Trevor Ferguson is a 86 y.o. male with medical history significant of HTN/Prostate cancer/ CKD with baseline creatinine of 1.7. Pt is a 86 y/o male recently discharged after t/t for diverticulitis seen in ed for abd pain and worsening feeling since discharge. He reports that when he went home he had some abdominal tenderness that seems to have gotten worse over the past 1 day.  He states he has been compliant with his antibiotics.  Denies any diarrhea or vomiting.  He is maintain a soft diet.  He states they are working on getting him into [facility] where he would have additional care for 3 days but he is in independent living and he states that he cannot take care of himself.  He states the pain is abdomen is severe, lower, nothing makes it better, nothing makes it worse.  He denies any chest pain or shortness of breath.  He does have a little bit of leg swelling but that is baseline for him.  He does report feeling more off balance with walking.  States is been going on for a few weeks but is not been worked up for it. Pt has been on home oxygen December 2020 ands has been on oxygen since then. Pt reports that he was weaned off daytime and was to cont his nighttime oxygen.  Pt also takes nebs tid. Pt's pulmonologist Dr.ALeskerov told him to use his inhalers and nebulizer.  Pt's last 2 d echo in July 2021 showed: 1. Left ventricular ejection fraction, by estimation, is 60 to 65%. The  left ventricle has normal function. The left ventricle has no regional  wall motion abnormalities. Left ventricular diastolic parameters were  normal.  2. Right ventricular systolic function is normal. The right ventricular  size is normal. There is severely elevated pulmonary artery systolic  pressure.  3. The mitral valve  is normal in structure. Trivial mitral valve  regurgitation. No evidence of mitral stenosis.  4. The aortic valve is normal in structure. Aortic valve regurgitation is  not visualized. No aortic stenosis is present.  5. The inferior vena cava is normal in size with greater than 50%  respiratory variability, suggesting right atrial pressure of 3 mmHg.   ED Course:  Blood pressure 101/88, pulse 82, temperature 97.8 F (36.6 C), temperature source Oral, resp. rate 18, height 5\' 10"  (1.778 m), weight 95 kg, SpO2 93 %. chest xray shows new opacity in lung. CT abd / pelvis [place] :shows worsening diverticulitis.    Review of Systems: As per HPI otherwise 10 point review of systems negative.    Past Medical History:  Diagnosis Date  . COPD (chronic obstructive pulmonary disease) ([place])   . Hypertension   . Pneumonia    [id number] hospitalized  . Prostate cancer ([place])    with seed placement  . Sleep apnea    noncompliant, dx 1996  . Stroke [facility]) 2012   no residuals    Past Surgical History:  Procedure Laterality Date  . adenoid    . APPENDECTOMY    . HEMORRHOID SURGERY    . spine cyst    . TONSILLECTOMY       reports that he quit smoking about 34 years ago. His smoking use included cigarettes. He quit after 40.00 years of use. He  has never used smokeless tobacco. He reports current alcohol use of about 14.0 standard drinks of alcohol per week. He reports that he does not use drugs.  Allergies  Allergen Reactions  . Penicillins Hives, Rash and Other (See Comments)    Rash on hands and blisters on ankles when 86 years old  . Bee Venom Palpitations    INSECT BITES/STINGS  . Darvon [Propoxyphene] Nausea Only and Rash    Family History  Problem Relation Age of Onset  . Congestive Heart Failure Mother   . COPD Sister   . Asthma Sister   . Tuberculosis Maternal Grandmother   . Stroke Maternal Grandfather     Prior to Admission medications   Medication Sig Start Date End Date  Taking? Authorizing Provider  aspirin EC 81 MG tablet Take 1 tablet (81 mg total) by mouth daily. 09/20/19   Minna Merritts, MD  Cholecalciferol (VITAMIN D-3) 25 MCG (1000 UT) CAPS Take 1,000 Units by mouth daily.     [provider]  dextromethorphan-guaiFENesin (MUCINEX DM) 30-600 MG 12hr tablet Take 1 tablet by mouth 2 (two) times daily. 03/04/20   Lorella Nimrod, MD  escitalopram (LEXAPRO) 10 MG tablet Take 10 mg by mouth daily.     [provider]  furosemide (LASIX) 20 MG tablet Take 1 tablet (20 mg total) by mouth daily as needed. Use sparingly. Take potassium when you take furosemide. 08/11/18 10/19/28  Minna Merritts, MD  losartan-hydrochlorothiazide (HYZAAR) 100-25 MG per tablet Take 1 tablet by mouth daily.    [provider]  metroNIDAZOLE (FLAGYL) 500 MG tablet Take 1 tablet (500 mg total) by mouth 3 (three) times daily for 5 days. 03/04/20 03/09/20  Lorella Nimrod, MD  ondansetron (ZOFRAN) 4 MG tablet Take 1 tablet (4 mg total) by mouth every 6 (six) hours as needed for nausea. 03/04/20   Lorella Nimrod, MD  potassium chloride SA (K-DUR,KLOR-CON) 20 MEQ tablet Take 1 tablet (20 mEq total) by mouth daily as needed. Take when you take the furosemide. 08/11/18   Minna Merritts, MD  sulfamethoxazole-trimethoprim (BACTRIM DS) 800-160 MG tablet Take 1 tablet by mouth 2 (two) times daily for 5 days. 03/04/20 03/09/20  Lorella Nimrod, MD  traZODone (DESYREL) 150 MG tablet Take 150 mg by mouth at bedtime.    [provider]  triamcinolone (KENALOG) 0.025 % cream Apply topically 2 (two) times daily. 02/01/20   [provider]    Physical Exam: Vitals:   03/06/20 1622 03/06/20 1623  BP: 101/88   Pulse: 82   Resp: 18   Temp: 97.8 F (36.6 C)   TempSrc: Oral   SpO2: 93%   Weight:  95 kg  Height:  5\' 10"  (1.778 m)    Constitutional: NAD, calm, comfortable Vitals:   03/06/20 1622 03/06/20 1623  BP: 101/88   Pulse: 82   Resp: 18   Temp: 97.8 F  (36.6 C)   TempSrc: Oral   SpO2: 93%   Weight:  95 kg  Height:  5\' 10"  (1.778 m)   Eyes: PERRL, lids and conjunctivae normal ENMT: Mucous membranes are moist. Posterior pharynx clear of any exudate or lesions.Normal dentition.  Neck: normal, supple, no masses, no thyromegaly Respiratory: clear to auscultation bilaterally, no wheezing, no crackles. Normal respiratory effort. No accessory muscle use.  Cardiovascular: Regular rate and rhythm, no murmurs / rubs / gallops. 2+ pedal edema. 2+ pedal pulses. No carotid bruits.  Abdomen: no tenderness, no masses palpated. No hepatosplenomegaly. Bowel  sounds positive.  Musculoskeletal: no clubbing / cyanosis. No joint deformity upper and lower extremities. Good ROM, no contractures. Normal muscle tone.  Skin: no rashes, lesions, ulcers. No induration Neurologic: CN 2-12 grossly intact. Sensation intact, DTR normal. Strength 5/5 in all 4.  Psychiatric: Normal judgment and insight. Alert and oriented x 3. Normal mood.    Labs on Admission: I have personally reviewed following labs and imaging studies  CBC: Recent Labs  Lab 03/01/20 1904 03/02/20 0621 03/03/20 0420 03/04/20 0338 03/06/20 1628  WBC 15.8* 20.2* 13.6* 9.9 9.7  HGB 12.5* 11.1* 10.0* 10.2* 11.8*  HCT 35.3* 31.2* 27.9* 29.1* 34.8*  MCV 90.5 90.2 91.2 92.7 93.8  PLT 139* 126* 106* 99* 355*   Basic Metabolic Panel: Recent Labs  Lab 03/01/20 1904 03/02/20 0621 03/03/20 0420 03/04/20 0338 03/06/20 1628  NA 138 137 133* 134* 134*  K 3.7 3.7 4.4 4.2 4.2  CL 107 104 103 104 103  CO2 21* 24 20* 24 23  GLUCOSE 147* 127* 107* 99 154*  BUN 34* 39* 38* 35* 38*  CREATININE 1.75* 1.95* 2.34* 2.05* 2.37*  CALCIUM 9.0 8.3* 7.6* 7.8* 8.4*   GFR: Estimated Creatinine Clearance: 23 mL/min (A) (by C-G formula based on SCr of 2.37 mg/dL (H)). Liver Function Tests: Recent Labs  Lab 03/01/20 1907 03/06/20 1628  AST 17 17  ALT 12 12  ALKPHOS 60 59  BILITOT 1.4* 0.6  PROT 6.4* 6.8   ALBUMIN 4.0 3.7   Recent Labs  Lab 03/01/20 1907 03/06/20 1628  LIPASE 36 31   No results for input(s): AMMONIA in the last 168 hours. Coagulation Profile: Recent Labs  Lab 03/01/20 1907  INR 1.1   Urine analysis:    Component Value Date/Time   COLORURINE YELLOW (A) 03/02/2020 1420   APPEARANCEUR HAZY (A) 03/02/2020 1420   APPEARANCEUR Clear 03/19/2014 1049   LABSPEC 1.017 03/02/2020 1420   LABSPEC 1.016 03/19/2014 1049   PHURINE 5.0 03/02/2020 1420   GLUCOSEU NEGATIVE 03/02/2020 1420   GLUCOSEU Negative 03/19/2014 1049   HGBUR NEGATIVE 03/02/2020 1420   BILIRUBINUR NEGATIVE 03/02/2020 1420   BILIRUBINUR Negative 03/19/2014 1049   KETONESUR NEGATIVE 03/02/2020 1420   PROTEINUR 30 (A) 03/02/2020 1420   NITRITE NEGATIVE 03/02/2020 1420   LEUKOCYTESUR NEGATIVE 03/02/2020 1420   LEUKOCYTESUR Negative 03/19/2014 1049    Recent Results (from the past 240 hour(s))  SARS Coronavirus 2 by RT PCR (hospital order, performed in [facility] hospital lab) Nasopharyngeal Nasopharyngeal Swab     Status: None   Collection Time: 03/01/20  8:53 PM   Specimen: Nasopharyngeal Swab  Result Value Ref Range Status   SARS Coronavirus 2 NEGATIVE NEGATIVE Final    Comment: (NOTE) SARS-CoV-2 target nucleic acids are NOT DETECTED.  The SARS-CoV-2 RNA is generally detectable in upper and lower respiratory specimens during the acute phase of infection. The lowest concentration of SARS-CoV-2 viral copies this assay can detect is 250 copies / mL. A negative result does not preclude SARS-CoV-2 infection and should not be used as the sole basis for treatment or other patient management decisions.  A negative result may occur with improper specimen collection / handling, submission of specimen other than nasopharyngeal swab, presence of viral mutation(s) within the areas targeted by this assay, and inadequate number of viral copies (<250 copies / mL). A negative result must be combined with  clinical observations, patient history, and epidemiological information.  Fact Sheet for Patients:   StrictlyIdeas.no  Fact Sheet for Healthcare Providers:  BankingDealers.co.za  This test is not yet approved or  cleared by the [place] and has been authorized for detection and/or diagnosis of SARS-CoV-2 by FDA under an Emergency Use Authorization (EUA).  This EUA will remain in effect (meaning this test can be used) for the duration of the COVID-19 declaration under Section 564(b)(1) of the Act, 21 U.S.C. section 360bbb-3(b)(1), unless the authorization is terminated or revoked sooner.  Performed at [facility], [address] [place], [place] [address]   Blood Culture (routine x 2)     Status: None   Collection Time: 03/01/20  8:53 PM   Specimen: BLOOD  Result Value Ref Range Status   Specimen Description BLOOD BLOOD LEFT WRIST  Final   Special Requests   Final    BOTTLES DRAWN AEROBIC AND ANAEROBIC Blood Culture results may not be optimal due to an inadequate volume of blood received in culture bottles   Culture   Final    NO GROWTH 5 DAYS Performed at [facility], [address], [place], [place] [address]    Report Status 03/06/2020 FINAL  Final  Blood Culture (routine x 2)     Status: None   Collection Time: 03/01/20  8:53 PM   Specimen: BLOOD  Result Value Ref Range Status   Specimen Description BLOOD RIGHT ANTECUBITAL  Final   Special Requests   Final    BOTTLES DRAWN AEROBIC AND ANAEROBIC Blood Culture adequate volume   Culture   Final    NO GROWTH 5 DAYS Performed at [facility], [address] [place], [place] [address]    Report Status 03/06/2020 FINAL  Final  C Difficile Quick Screen w PCR reflex     Status: None   Collection Time: 03/03/20  6:13 PM   Specimen: STOOL  Result Value Ref Range Status   C Diff antigen NEGATIVE NEGATIVE Final   C Diff toxin NEGATIVE  NEGATIVE Final   C Diff interpretation No C. difficile detected.  Final    Comment: Performed at [facility], [address], [place], [place] [address]     Radiological Exams on Admission: CT ABDOMEN PELVIS WO CONTRAST Result Date: 03/06/2020 CLINICAL DATA:  86 year old male with abdominal distension. EXAM: CT ABDOMEN AND PELVIS WITHOUT CONTRAST TECHNIQUE: Multidetector CT imaging of the abdomen and pelvis was performed following the standard protocol without IV contrast. COMPARISON:  CT abdomen pelvis dated 03/01/2020. FINDINGS: Evaluation of this exam is limited in the absence of intravenous contrast. Lower chest: Partially visualized small right pleural effusion with associated partial right lung base atelectasis. Pneumonia is not excluded. Clinical correlation is recommended. There is coronary vascular calcification. No intra-abdominal free air. Small perihepatic free fluid. Hepatobiliary: The liver is unremarkable. No intrahepatic biliary ductal dilatation. Mild pericholecystic stranding, likely related to perihepatic fluid. No calcified gallstone. Pancreas: Mild haziness of the fat adjacent to the head and uncinate process of the pancreas may be related to perihepatic ascites. Correlation with pancreatic enzymes recommended to exclude acute pancreatitis. There is a punctate focus of calcification in the uncinate process of the pancreas, similar to prior CT. Spleen: Normal in size without focal abnormality. Adrenals/Urinary Tract: The adrenal glands unremarkable. There is no hydronephrosis on either side. There is a 6 mm vascular calcification versus nonobstructing stone in the upper pole of the right kidney. A 1 cm hypodense lesion in the upper pole of the left kidney is not characterized on this noncontrast CT, possibly a cyst. The visualized ureters  and urinary bladder appear unremarkable. Stomach/Bowel: There is sigmoid diverticulosis without active inflammatory changes. There is a 4.5 cm  distal duodenal diverticulum. There is diverticulosis of the distal small bowel loop in the right lower abdomen with associated active inflammation consistent with acute diverticulitis. Overall slight interval worsening of the inflammatory process since the prior CT. No abscess or perforation. There is no bowel obstruction. Loose stool throughout the colon compatible with diarrheal state. Appendectomy. Vascular/Lymphatic: Advanced aortoiliac atherosclerotic disease. The IVC is unremarkable. No portal venous gas. There is no adenopathy. Reproductive: Prostate brachytherapy seeds. Other: Mild diffuse subcutaneous edema. No fluid collection. Musculoskeletal: Osteopenia with degenerative changes of the spine. No acute osseous pathology.  IMPRESSION:  1. Acute diverticulitis of the distal small bowel in the right lower abdomen with slight interval worsening of the inflammatory process since the prior CT. No abscess or perforation. 2. Diarrheal state. No bowel obstruction. 3. Small right pleural effusion with associated partial right lung base atelectasis. 4. Aortic Atherosclerosis (ICD10-I70.0). Electronically Signed   By: Anner Crete M.D.   On: 03/06/2020 20:53   CT Head Wo Contrast Result Date: 03/06/2020 CLINICAL DATA:  Headache EXAM: CT HEAD WITHOUT CONTRAST TECHNIQUE: Contiguous axial images were obtained from the base of the skull through the vertex without intravenous contrast. COMPARISON:  CT 03/19/2014 FINDINGS: Brain: No acute territorial infarction, hemorrhage, or intracranial mass. Moderate atrophy. Patchy hypodensity in the white matter consistent with chronic small vessel ischemic change. Stable ventricle size. Chronic lacunar infarcts in the basal ganglia. Vascular: No hyperdense vessels.  Carotid vascular calcification Skull: Normal. Negative for fracture or focal lesion. Sinuses/Orbits: Patchy mucosal thickening in the ethmoid sinuses Other: None IMPRESSION:  1. No CT evidence for acute  intracranial abnormality. 2. Atrophy and chronic small vessel ischemic changes of the white matter. Electronically Signed   By: Donavan Foil M.D.   On: 03/06/2020 20:50   DG Chest Portable 1 View Result Date: 03/06/2020 CLINICAL DATA:  Evaluate pulmonary edema. EXAM: PORTABLE CHEST 1 VIEW COMPARISON:  03/01/2020 FINDINGS: Cardiac enlargement is stable. Small right pleural effusion. Diffuse pulmonary vascular congestion noted. Asymmetric opacity within the right mid lung is noted, new from previous exam.  IMPRESSION: 1. New asymmetric opacity within the right mid lung which may represent asymmetric edema versus pneumonia. 2. Cardiac enlargement and pulmonary vascular congestion. Electronically Signed   By: Kerby Moors M.D.   On: 03/06/2020 20:36    EKG: Independently reviewed.NSR 78.  Assessment/Plan Active Problems:   Abdominal pain   Diverticulitis   COPD (chronic obstructive pulmonary disease) (HCC)   Chronic diastolic CHF (congestive heart failure) (HCC)   Essential hypertension   Acute kidney injury superimposed on CKD (HCC)   Anemia  Abdominal Pain/ diverticulitis: -pt return with worsening abd pain neg ct for abscess or perforation.  -continue abx regimen / probiotics.  -clear liquid diet.  -Prn pain meds.   COPD:  -stable. -cont oxygen at 3 L [place].  C/H CHF: -Echo report as follow: Pt's last 2 d echo in July 2021 showed: 1. Left ventricular ejection fraction, by estimation, is 60 to 65%. The  left ventricle has normal function. The left ventricle has no regional  wall motion abnormalities. Left ventricular diastolic parameters were  normal.  2. Right ventricular systolic function is normal. The right ventricular  size is normal. There is severely elevated pulmonary artery systolic  pressure.  3. The mitral valve is normal in structure. Trivial mitral valve  regurgitation. No evidence of mitral stenosis.  4. The aortic valve is normal in structure. Aortic valve  regurgitation is  not visualized. No aortic stenosis is present.  5. The inferior vena cava is normal in size with greater than 50%  respiratory variability, suggesting right atrial pressure of 3 mmHg.  -diuretic therapy held due to worsening kidney function. -strict I/O.  HTN: -losartan hctz both held due to [facility]. -Hydralazine prn for htn.   Anemia: -anemia panel from 2019 reviewed and c/w ACD. -type and screen/ iv ppi   DVT prophylaxis: heparin  Code Status: Full code Family Communication: Pryor Curia [phone]  Disposition Plan: Home  Consults called: None Admission status: Inpatient  Para Skeans MD [facility] If 7PM-7AM, please contact night-coverage [email or web address] Password [facility]  03/06/2020, 10:02 PM

## 2020-03-06 NOTE — ED Provider Notes (Addendum)
[facility] Emergency Department Provider Note  ____________________________________________   First MD Initiated Contact with Patient 03/06/20 1933     (approximate)  I have reviewed the triage vital signs and the nursing notes.   HISTORY  Chief Complaint Abdominal Pain    HPI Trevor Ferguson is a 86 y.o. male with COPD on 2L baseline, hypertension, prostate cancer who comes in for abdominal tenderness.  Patient reports that he feels like he is being discharged too early.  He reports that when he went home he had some abdominal tenderness that seems to have gotten worse over the past 1 day.  He states he has been compliant with his antibiotics.  Denies any diarrhea or vomiting.  He is maintain a soft diet.  He states they are working on getting him into [facility] where he would have additional care for 3 days but he is in independent living and he states that he cannot take care of himself.  He states the pain is abdomen is severe, lower, nothing makes it better, nothing makes it worse.  He denies any chest pain or shortness of breath.  He does have a little bit of leg swelling but that is baseline for him.  He does report feeling more off balance with walking.  States is been going on for a few weeks but is not been worked up for it.  On review of records: To note patient was seen on 7/17 and had a stool culture that was positive for E. coli.  Patient was given Cipro.  Patient had a CT scan concerning for diverticulitis and was treated with aztreonam and Flagyl.  He developed some diarrhea and had a negative C. difficile test.  Patient was currently on Bactrim and Flagyl to complete a 7-day course.     Past Medical History:  Diagnosis Date  . COPD (chronic obstructive pulmonary disease) ([place])   . Hypertension   . Pneumonia    [id number] hospitalized  . Prostate cancer ([place])    with seed placement  . Sleep apnea    noncompliant, dx 1996  . Stroke [facility])  2012   no residuals    Patient Active Problem List   Diagnosis Date Noted  . Diverticulitis 03/01/2020  . Pneumonia 07/17/2018  . Leg swelling 12/18/2015  . Chronic diastolic CHF (congestive heart failure) ([place]) 07/18/2015  . Essential hypertension 07/18/2015  . COPD (chronic obstructive pulmonary disease) ([place]) 03/28/2015  . Sleep apnea 03/28/2015  . SOB (shortness of breath) 03/28/2015    Past Surgical History:  Procedure Laterality Date  . adenoid    . APPENDECTOMY    . HEMORRHOID SURGERY    . spine cyst    . TONSILLECTOMY      Prior to Admission medications   Medication Sig Start Date End Date Taking? Authorizing Provider  aspirin EC 81 MG tablet Take 1 tablet (81 mg total) by mouth daily. 09/20/19   Minna Merritts, MD  Cholecalciferol (VITAMIN D-3) 25 MCG (1000 UT) CAPS Take 1,000 Units by mouth daily.     [provider]  dextromethorphan-guaiFENesin (MUCINEX DM) 30-600 MG 12hr tablet Take 1 tablet by mouth 2 (two) times daily. 03/04/20   Lorella Nimrod, MD  escitalopram (LEXAPRO) 10 MG tablet Take 10 mg by mouth daily.     [provider]  furosemide (LASIX) 20 MG tablet Take 1 tablet (20 mg total) by mouth daily as needed. Use sparingly. Take potassium when you take furosemide. 08/11/18  10/19/28  Minna Merritts, MD  losartan-hydrochlorothiazide (HYZAAR) 100-25 MG per tablet Take 1 tablet by mouth daily.    [provider]  metroNIDAZOLE (FLAGYL) 500 MG tablet Take 1 tablet (500 mg total) by mouth 3 (three) times daily for 5 days. 03/04/20 03/09/20  Lorella Nimrod, MD  ondansetron (ZOFRAN) 4 MG tablet Take 1 tablet (4 mg total) by mouth every 6 (six) hours as needed for nausea. 03/04/20   Lorella Nimrod, MD  potassium chloride SA (K-DUR,KLOR-CON) 20 MEQ tablet Take 1 tablet (20 mEq total) by mouth daily as needed. Take when you take the furosemide. 08/11/18   Minna Merritts, MD  sulfamethoxazole-trimethoprim (BACTRIM DS) 800-160 MG tablet Take 1  tablet by mouth 2 (two) times daily for 5 days. 03/04/20 03/09/20  Lorella Nimrod, MD  traZODone (DESYREL) 150 MG tablet Take 150 mg by mouth at bedtime.    [provider]  triamcinolone (KENALOG) 0.025 % cream Apply topically 2 (two) times daily. 02/01/20   [provider]    Allergies Penicillins, Bee venom, and Darvon [propoxyphene]  Family History  Problem Relation Age of Onset  . Congestive Heart Failure Mother   . COPD Sister   . Asthma Sister   . Tuberculosis Maternal Grandmother   . Stroke Maternal Grandfather     Social History Social History   Tobacco Use  . Smoking status: Former Smoker    Years: 40.00    Types: Cigarettes    Quit date: 08/19/1985    Years since quitting: 34.5  . Smokeless tobacco: Never Used  Vaping Use  . Vaping Use: Never used  Substance Use Topics  . Alcohol use: Yes    Alcohol/week: 14.0 standard drinks    Types: 14 Shots of liquor per week  . Drug use: No      Review of Systems Constitutional: No fever/chills, feeling off balance Eyes: No visual changes. ENT: No sore throat. Cardiovascular: Denies chest pain. Respiratory: Denies shortness of breath. Gastrointestinal: Abdominal pain, bloating.  No nausea, no vomiting.  No diarrhea.  No constipation. Genitourinary: Negative for dysuria. Musculoskeletal: Negative for back pain. Skin: Negative for rash. Neurological: Negative for headaches, focal weakness or numbness. All other ROS negative ____________________________________________   PHYSICAL EXAM:  VITAL SIGNS: ED Triage Vitals  Enc Vitals Group     BP 03/06/20 1622 101/88     Pulse Rate 03/06/20 1622 82     Resp 03/06/20 1622 18     Temp 03/06/20 1622 97.8 F (36.6 C)     Temp Source 03/06/20 1622 Oral     SpO2 03/06/20 1622 93 %     Weight 03/06/20 1623 209 lb 7 oz (95 kg)     Height 03/06/20 1623 5\' 10"  (1.778 m)     Head Circumference --      Peak Flow --      Pain Score 03/06/20 1623 7     Pain  Loc --      Pain Edu? --      Excl. in [place]? --     Constitutional: Alert and oriented. Well appearing and in no acute distress. Eyes: Conjunctivae are normal. EOMI. Head: Atraumatic. Nose: No congestion/rhinnorhea. Mouth/Throat: Mucous membranes are moist.   Neck: No stridor. Trachea Midline. FROM Cardiovascular: Normal rate, regular rhythm. Grossly normal heart sounds.  Good peripheral circulation. Respiratory: Normal respiratory effort.  No retractions. Lungs CTAB. Gastrointestinal: Soft with some tenderness throughout mostly in the lower abdomen.  No distention. No abdominal bruits.  Musculoskeletal: No lower extremity tenderness nor edema.  No joint effusions. Neurologic:  Normal speech and language. No gross focal neurologic deficits are appreciated.  Equal strength in arms and legs Skin:  Skin is warm, dry and intact. No rash noted. Psychiatric: Mood and affect are normal. Speech and behavior are normal. GU: Deferred   ____________________________________________   LABS (all labs ordered are listed, but only abnormal results are displayed)  Labs Reviewed  COMPREHENSIVE METABOLIC PANEL - Abnormal; Notable for the following components:      Result Value   Sodium 134 (*)    Glucose, Bld 154 (*)    BUN 38 (*)    Creatinine, Ser 2.37 (*)    Calcium 8.4 (*)    GFR calc non Af Amer 23 (*)    GFR calc Af Amer 27 (*)    All other components within normal limits  CBC - Abnormal; Notable for the following components:   RBC 3.71 (*)    Hemoglobin 11.8 (*)    HCT 34.8 (*)    Platelets 140 (*)    All other components within normal limits  LIPASE, BLOOD  URINALYSIS, COMPLETE (UACMP) WITH MICROSCOPIC   ____________________________________________   ED ECG REPORT I, Vanessa Roe, the attending physician, personally viewed and interpreted this ECG.  Normal sinus rate of 78, no ST elevation, no T wave inversions, type I AV  block ____________________________________________  RADIOLOGY Alyjah Bellow, personally viewed and evaluated these images (plain radiographs) as part of my medical decision making, as well as reviewing the written report by the radiologist.  ED MD interpretation: Pending  Official radiology report(s): No results found.  ____________________________________________   PROCEDURES  Procedure(s) performed (including Critical Care):  Procedures   ____________________________________________   INITIAL IMPRESSION / ASSESSMENT AND PLAN / ED COURSE  Trevor Ferguson was evaluated in Emergency Department on 03/06/2020 for the symptoms described in the history of present illness. He was evaluated in the context of the global COVID-19 pandemic, which necessitated consideration that the patient might be at risk for infection with the SARS-CoV-2 virus that causes COVID-19. Institutional protocols and algorithms that pertain to the evaluation of patients at risk for COVID-19 are in a state of rapid change based on information released by regulatory bodies including the CDC and federal and state organizations. These policies and algorithms were followed during the patient's care in the ED.    Patient is a 86 year old coming in with worsening abdominal pain after getting treatment for diverticulitis.  Patient was also getting treatment for E. coli but is not had any worsening diarrhea.  Low suspicion for C. difficile given no diarrhea.  Will get labs to evaluate for Electra abnormalities, AKI, UTI.  Will get repeat CT scan to evaluate for worsening diverticulitis or perforation or abscess.  He denies any shortness of breath or chest pain.  Will get CT head given he does been feeling a little off balance with walking but that sounds to have been going on for a few weeks now he denies hitting his head.  White count is normal.  Downtrending from prior admission.  Platelets are stably low.  Hemoglobin is  stable.  Kidney function is elevated but around his baseline at 2.37  Difficult to tell patient's fluid status given he does have a little bit of edema in his legs.  They attempted to diurese him when he was inpatient last time and his kidney function went up.  Prior to this all happening  his kidney function was like 1.7.  Not much different than when he was recently admitted.  We will add on a chest x-ray and BNP to see if patient may benefit from a little bit of fluids  We will give some IV fentanyl and Zofran to help with patient's pain.  Patient handed off to oncoming team pending CT imaging and final disposition based upon patient's symptoms and CT imaging.  If results are normal and pain is getting better patient potentially could go home if able to ambulate.  Versus if patient does not feel safe going home may need placement.        ____________________________________________   FINAL CLINICAL IMPRESSION(S) / ED DIAGNOSES   Final diagnoses:  Abdominal pain, unspecified abdominal location      MEDICATIONS GIVEN DURING THIS VISIT:  Medications  fentaNYL (SUBLIMAZE) injection 50 mcg (has no administration in time range)  ondansetron (ZOFRAN) injection 4 mg (has no administration in time range)     ED Discharge Orders    None       Note:  This document was prepared using Dragon voice recognition software and may include unintentional dictation errors.   Vanessa Dublin, MD 03/06/20 2029    Vanessa [place]au, MD 03/06/20 2031

## 2020-03-06 NOTE — ED Notes (Signed)
Iv started and meds given.

## 2020-03-07 DIAGNOSIS — J449 Chronic obstructive pulmonary disease, unspecified: Secondary | ICD-10-CM

## 2020-03-07 DIAGNOSIS — I5032 Chronic diastolic (congestive) heart failure: Secondary | ICD-10-CM

## 2020-03-07 DIAGNOSIS — K5712 Diverticulitis of small intestine without perforation or abscess without bleeding: Principal | ICD-10-CM

## 2020-03-07 LAB — COMPREHENSIVE METABOLIC PANEL
ALT: 12 U/L (ref 0–44)
AST: 16 U/L (ref 15–41)
Albumin: 3.7 g/dL (ref 3.5–5.0)
Alkaline Phosphatase: 52 U/L (ref 38–126)
Anion gap: 7 (ref 5–15)
BUN: 35 mg/dL — ABNORMAL HIGH (ref 8–23)
CO2: 24 mmol/L (ref 22–32)
Calcium: 8.2 mg/dL — ABNORMAL LOW (ref 8.9–10.3)
Chloride: 107 mmol/L (ref 98–111)
Creatinine, Ser: 2.2 mg/dL — ABNORMAL HIGH (ref 0.61–1.24)
GFR calc Af Amer: 29 mL/min — ABNORMAL LOW (ref >60)
GFR calc non Af Amer: 25 mL/min — ABNORMAL LOW (ref >60)
Glucose, Bld: 113 mg/dL — ABNORMAL HIGH (ref 70–99)
Potassium: 4.1 mmol/L (ref 3.5–5.1)
Sodium: 138 mmol/L (ref 135–145)
Total Bilirubin: 0.8 mg/dL (ref 0.3–1.2)
Total Protein: 6.3 g/dL — ABNORMAL LOW (ref 6.5–8.1)

## 2020-03-07 LAB — RETICULOCYTES
Immature Retic Fract: 15.9 % (ref 2.3–15.9)
RBC.: 3.5 MIL/uL — ABNORMAL LOW (ref 4.22–5.81)
Retic Count, Absolute: 41.7 10*3/uL (ref 19.0–186.0)
Retic Ct Pct: 1.2 % (ref 0.4–3.1)

## 2020-03-07 LAB — CBC
HCT: 32.4 % — ABNORMAL LOW (ref 39.0–52.0)
Hemoglobin: 11.4 g/dL — ABNORMAL LOW (ref 13.0–17.0)
MCH: 32.4 pg (ref 26.0–34.0)
MCHC: 35.2 g/dL (ref 30.0–36.0)
MCV: 92 fL (ref 80.0–100.0)
Platelets: 131 10*3/uL — ABNORMAL LOW (ref 150–400)
RBC: 3.52 MIL/uL — ABNORMAL LOW (ref 4.22–5.81)
RDW: 14.5 % (ref 11.5–15.5)
WBC: 10 10*3/uL (ref 4.0–10.5)
nRBC: 0 % (ref 0.0–0.2)

## 2020-03-07 LAB — OCCULT BLOOD X 1 CARD TO LAB, STOOL: Fecal Occult Bld: NEGATIVE

## 2020-03-07 LAB — VITAMIN B12: Vitamin B-12: 560 pg/mL (ref 180–914)

## 2020-03-07 LAB — TYPE AND SCREEN
ABO/RH(D): O POS
Antibody Screen: NEGATIVE

## 2020-03-07 MED ORDER — CIPROFLOXACIN IN D5W 400 MG/200ML IV SOLN
400.0000 mg | INTRAVENOUS | Status: DC
  Administered 2020-03-07: 400 mg via INTRAVENOUS
  Filled 2020-03-07 (×2): qty 200

## 2020-03-07 MED ORDER — IPRATROPIUM-ALBUTEROL 0.5-2.5 (3) MG/3ML IN SOLN
3.0000 mL | RESPIRATORY_TRACT | Status: DC | PRN
  Administered 2020-03-07: 3 mL via RESPIRATORY_TRACT
  Filled 2020-03-07: qty 3

## 2020-03-07 MED ORDER — RISAQUAD PO CAPS
2.0000 | ORAL_CAPSULE | Freq: Three times a day (TID) | ORAL | Status: DC
  Administered 2020-03-07 – 2020-03-13 (×19): 2 via ORAL
  Filled 2020-03-07 (×19): qty 2

## 2020-03-07 NOTE — Evaluation (Signed)
Physical Therapy Evaluation Patient Details Name: Trevor Ferguson MRN: [id number] DOB: 1926/05/20 Today's Date: 03/07/2020   History of Present Illness  86 y.o. male with a history of COPD, hypertension, prostate CA sleep apnea and CVA, here last week for weakness and vomiting and discharged 7/17, now returns with increased abdominal pain.  Clinical Impression  Pt was discharged just 3 days ago, states he is feeling better now but still having abdominal discomfort and feeling weaker.  He was very pleasant and motivated t/o the session and actually did quite well, but he certainly had some unsteadiness and most notedly had considerable fatigue (despite his baseline 3L 02) with the effort with DOE and drop in sats to 87%.  Overall pt showed good effort but is not at his baseline and would benefit from some time in STR to build up strength, activity tolerance, and work on balance and general safety.      Follow Up Recommendations SNF    Equipment Recommendations  None recommended by PT    Recommendations for Other Services       Precautions / Restrictions Precautions Precautions: Fall Restrictions Weight Bearing Restrictions: No      Mobility  Bed Mobility Overal bed mobility: Modified Independent             General bed mobility comments: Pt was able to get up to sitting w/o assist or heavy use of rails  Transfers Overall transfer level: Needs assistance Equipment used: None Transfers: Sit to/from Stand Sit to Stand: Min guard         General transfer comment: Pt was able to rise on his own power, needed heavy UE use but did not need direct assist  Ambulation/Gait Ambulation/Gait assistance: Min guard Gait Distance (Feet): 90 Feet Assistive device: None       General Gait Details: Pt was able to ambulate into and down the hall w/o AD.  He was generally confident but did have a few stagger steps most notedly with stop and change of direction.  Most notedly he did  fatigue with the effort and despite being on (baseline) 3L his sats dropped from the mid 90s into the 80s generally staying ~88% much of the time.  Pt moved relatively well but is not at his baseline and does endorse decreased balance/stability today and over the last ~2 weeks.  Pt does have ADs at home and we did discuss possibly using them initially until he is feeling back to his baseline w/o AD.  Stairs            Wheelchair Mobility    Modified Rankin (Stroke Patients Only)       Balance Overall balance assessment: Needs assistance Sitting-balance support: No upper extremity supported;Feet supported Sitting balance-Leahy Scale: Good     Standing balance support: During functional activity;No upper extremity supported Standing balance-Leahy Scale: Fair Standing balance comment: Pt with no overt LOBs, but did have multiple stagger steps during functional tasks, especially with 180* turn during gait training                             Pertinent Vitals/Pain Pain Assessment: No/denies pain    Home Living Family/patient expects to be discharged to:: Private residence [facility]) Living Arrangements: Alone Available Help at Discharge: Personal care attendant;Available PRN/intermittently Type of Home: Independent living facility Home Access: Level entry     Home Layout: One level Home Equipment: [place] - 4 wheels;Cane -  single point;Shower seat;Grab bars - tub/shower;[place] - 2 wheels Additional Comments: Wife at SNF since Feb     Prior Function Level of Independence: Independent         Comments: Pt was unable to rise from kneeling to comit last week, reports he has not had a true fall in >1 year.     Hand Dominance        Extremity/Trunk Assessment   Upper Extremity Assessment Upper Extremity Assessment: Overall WFL for tasks assessed;Generalized weakness    Lower Extremity Assessment Lower Extremity Assessment: Overall WFL for tasks  assessed;Generalized weakness       Communication   Communication: No difficulties  Cognition Arousal/Alertness: Awake/alert Behavior During Therapy: WFL for tasks assessed/performed Overall Cognitive Status: Within Functional Limits for tasks assessed                                 General Comments: pt pleasant t/o session, eager to work with PT      General Comments      Exercises     Assessment/Plan    PT Assessment Patient needs continued PT services  PT Problem List Decreased mobility;Decreased safety awareness;Decreased activity tolerance;Decreased balance       PT Treatment Interventions Therapeutic exercise;Gait training;Balance training;Stair training;Neuromuscular re-education;Functional mobility training;Cognitive remediation;Therapeutic activities;Patient/family education    PT Goals (Current goals can be found in the Care Plan section)  Acute Rehab PT Goals Patient Stated Goal: improve strength, balance, activity tolerance at rehab before going home PT Goal Formulation: With patient Time For Goal Achievement: 03/21/20 Potential to Achieve Goals: Good    Frequency Min 2X/week   Barriers to discharge        Co-evaluation               AM-PAC PT "6 Clicks" Mobility  Outcome Measure Help needed turning from your back to your side while in a flat bed without using bedrails?: A Little Help needed moving from lying on your back to sitting on the side of a flat bed without using bedrails?: A Little Help needed moving to and from a bed to a chair (including a wheelchair)?: A Little Help needed standing up from a chair using your arms (e.g., wheelchair or bedside chair)?: A Little Help needed to walk in hospital room?: A Little Help needed climbing 3-5 steps with a railing? : A Little 6 Click Score: 18    End of Session Equipment Utilized During Treatment: Gait belt;Oxygen Activity Tolerance: Patient tolerated treatment well Patient  left: in chair;with call bell/phone within reach (OT in room) Nurse Communication: Mobility status PT Visit Diagnosis: Unsteadiness on feet (R26.81);Other abnormalities of gait and mobility (R26.89);Muscle weakness (generalized) (M62.81);History of falling (Z91.81)    Time: 1435-1500 PT Time Calculation (min) (ACUTE ONLY): 25 min   Charges:   PT Evaluation $PT Eval Low Complexity: 1 Low PT Treatments $Gait Training: 8-22 mins        Kreg Shropshire, DPT 03/07/2020, 3:44 PM

## 2020-03-07 NOTE — Progress Notes (Signed)
[facility] NAME: Trevor Ferguson    MR#:  [id number]  DATE OF BIRTH:  [birth year]  SUBJECTIVE:   Came in with increasing abdominal pain. Patient was discharged on July 17 with acute diverticulitis has been taking antibiotics. No fever. Tolerating full liquid requesting soft diet. Son in law at bedside REVIEW OF SYSTEMS:   Review of Systems  Constitutional: Negative for chills, fever and weight loss.  HENT: Negative for ear discharge, ear pain and nosebleeds.   Eyes: Negative for blurred vision, pain and discharge.  Respiratory: Negative for sputum production, shortness of breath, wheezing and stridor.   Cardiovascular: Negative for chest pain, palpitations, orthopnea and PND.  Gastrointestinal: Positive for abdominal pain. Negative for diarrhea, nausea and vomiting.  Genitourinary: Negative for frequency and urgency.  Musculoskeletal: Negative for back pain and joint pain.  Neurological: Positive for weakness. Negative for sensory change, speech change and focal weakness.  Psychiatric/Behavioral: Negative for depression and hallucinations. The patient is not nervous/anxious.    Tolerating Diet:yes Tolerating PT: pending  DRUG ALLERGIES:   Allergies  Allergen Reactions  . Penicillins Hives, Rash and Other (See Comments)    Rash on hands and blisters on ankles when 86 years old  . Bee Venom Palpitations    INSECT BITES/STINGS  . Darvon [Propoxyphene] Nausea Only and Rash    VITALS:  Blood pressure 119/79, pulse 67, temperature (!) 97.5 F (36.4 C), temperature source Oral, resp. rate 20, height 5\' 9"  (1.753 m), weight 98.2 kg, SpO2 96 %.  PHYSICAL EXAMINATION:   Physical Exam  GENERAL:  86 y.o.-year-old patient lying in the bed with no acute distress.  EYES: Pupils equal, round, reactive to light and accommodation. No scleral icterus.   HEENT: Head atraumatic, normocephalic. Oropharynx and nasopharynx clear.  NECK:   Supple, no jugular venous distention. No thyroid enlargement, no tenderness.  LUNGS: Normal breath sounds bilaterally, no wheezing, rales, rhonchi. No use of accessory muscles of respiration.  CARDIOVASCULAR: S1, S2 normal. No murmurs, rubs, or gallops.  ABDOMEN: Soft, mild diffuse tenderness, nondistended. Bowel sounds present. No organomegaly or mass.  EXTREMITIES: No cyanosis, clubbing or edema b/l.    NEUROLOGIC: Cranial nerves II through XII are intact. No focal Motor or sensory deficits b/l.   PSYCHIATRIC:  patient is alert and oriented x 3.  SKIN: No obvious rash, lesion, or ulcer.   LABORATORY PANEL:  CBC Recent Labs  Lab 03/07/20 0112  WBC 10.0  HGB 11.4*  HCT 32.4*  PLT 131*    Chemistries  Recent Labs  Lab 03/07/20 0112  NA 138  K 4.1  CL 107  CO2 24  GLUCOSE 113*  BUN 35*  CREATININE 2.20*  CALCIUM 8.2*  AST 16  ALT 12  ALKPHOS 52  BILITOT 0.8   Cardiac Enzymes No results for input(s): TROPONINI in the last 168 hours. RADIOLOGY:  CT ABDOMEN PELVIS WO CONTRAST  Result Date: 03/06/2020 CLINICAL DATA:  86 year old male with abdominal distension. EXAM: CT ABDOMEN AND PELVIS WITHOUT CONTRAST TECHNIQUE: Multidetector CT imaging of the abdomen and pelvis was performed following the standard protocol without IV contrast. COMPARISON:  CT abdomen pelvis dated 03/01/2020. FINDINGS: Evaluation of this exam is limited in the absence of intravenous contrast. Lower chest: Partially visualized small right pleural effusion with associated partial right lung base atelectasis. Pneumonia is not excluded. Clinical correlation is recommended. There is coronary vascular calcification. No intra-abdominal free air. Small perihepatic free fluid. Hepatobiliary: The liver  is unremarkable. No intrahepatic biliary ductal dilatation. Mild pericholecystic stranding, likely related to perihepatic fluid. No calcified gallstone. Pancreas: Mild haziness of the fat adjacent to the head and uncinate  process of the pancreas may be related to perihepatic ascites. Correlation with pancreatic enzymes recommended to exclude acute pancreatitis. There is a punctate focus of calcification in the uncinate process of the pancreas, similar to prior CT. Spleen: Normal in size without focal abnormality. Adrenals/Urinary Tract: The adrenal glands unremarkable. There is no hydronephrosis on either side. There is a 6 mm vascular calcification versus nonobstructing stone in the upper pole of the right kidney. A 1 cm hypodense lesion in the upper pole of the left kidney is not characterized on this noncontrast CT, possibly a cyst. The visualized ureters and urinary bladder appear unremarkable. Stomach/Bowel: There is sigmoid diverticulosis without active inflammatory changes. There is a 4.5 cm distal duodenal diverticulum. There is diverticulosis of the distal small bowel loop in the right lower abdomen with associated active inflammation consistent with acute diverticulitis. Overall slight interval worsening of the inflammatory process since the prior CT. No abscess or perforation. There is no bowel obstruction. Loose stool throughout the colon compatible with diarrheal state. Appendectomy. Vascular/Lymphatic: Advanced aortoiliac atherosclerotic disease. The IVC is unremarkable. No portal venous gas. There is no adenopathy. Reproductive: Prostate brachytherapy seeds. Other: Mild diffuse subcutaneous edema. No fluid collection. Musculoskeletal: Osteopenia with degenerative changes of the spine. No acute osseous pathology. IMPRESSION: 1. Acute diverticulitis of the distal small bowel in the right lower abdomen with slight interval worsening of the inflammatory process since the prior CT. No abscess or perforation. 2. Diarrheal state. No bowel obstruction. 3. Small right pleural effusion with associated partial right lung base atelectasis. 4. Aortic Atherosclerosis (ICD10-I70.0). Electronically Signed   By: Anner Crete M.D.    On: 03/06/2020 20:53   CT Head Wo Contrast  Result Date: 03/06/2020 CLINICAL DATA:  Headache EXAM: CT HEAD WITHOUT CONTRAST TECHNIQUE: Contiguous axial images were obtained from the base of the skull through the vertex without intravenous contrast. COMPARISON:  CT 03/19/2014 FINDINGS: Brain: No acute territorial infarction, hemorrhage, or intracranial mass. Moderate atrophy. Patchy hypodensity in the white matter consistent with chronic small vessel ischemic change. Stable ventricle size. Chronic lacunar infarcts in the basal ganglia. Vascular: No hyperdense vessels.  Carotid vascular calcification Skull: Normal. Negative for fracture or focal lesion. Sinuses/Orbits: Patchy mucosal thickening in the ethmoid sinuses Other: None IMPRESSION: 1. No CT evidence for acute intracranial abnormality. 2. Atrophy and chronic small vessel ischemic changes of the white matter. Electronically Signed   By: Donavan Foil M.D.   On: 03/06/2020 20:50   DG Chest Portable 1 View  Result Date: 03/06/2020 CLINICAL DATA:  Evaluate pulmonary edema. EXAM: PORTABLE CHEST 1 VIEW COMPARISON:  03/01/2020 FINDINGS: Cardiac enlargement is stable. Small right pleural effusion. Diffuse pulmonary vascular congestion noted. Asymmetric opacity within the right mid lung is noted, new from previous exam. IMPRESSION: 1. New asymmetric opacity within the right mid lung which may represent asymmetric edema versus pneumonia. 2. Cardiac enlargement and pulmonary vascular congestion. Electronically Signed   By: Kerby Moors M.D.   On: 03/06/2020 20:36   ASSESSMENT AND PLAN:   Trevor Ferguson is a 86 y.o. male with medical history significant of HTN/Prostate cancer/ CKD with baseline creatinine of 1.7. Pt is a 86 y/o male recently discharged after t/t for diverticulitis seen in ed for abd pain and worsening feeling since discharge.He reports that when he went home he  had some abdominal tenderness that seems to have gotten worse over the past 1  day. He states he has been compliant with his antibiotics  Acute diverticulitis.   -Continue Cipro and Flagyl. -Continue supportive care. -We will obtain PT/OT evaluation for disposition.  As patient lives in independent living facility alone-- patient appears to be having generalized weakness overall declining. -Will benefit from rehab -G.I. consultation with Dr. Marius Ditch. Patient normally follows [facility] -He was tested positive for E. coli on stool culture d February 16, 2020. Has had completed couple rounds of antibiotics since then  -CT abdomen from yesterday showed slight worsening of diverticulitis. No abscess of perforation.   -denies diarrhea. Has mushy stools. C. diff was negative on 03/03/20 -patient was on full liquid want soft diet.  CKD stage IIIb.  stable. Creatinine around 2.3. -Continue to monitor -Avoid nephrotoxins  Hypertension.  Blood pressure within goal today.  Patient was on Hyzaar at home. -Holding home antihypertensives-blood pressure soft today.  Depression.  No acute concern. -Continue home dose of Lexapro and trazodone.  CHF chronic diastolic stable on chronic home oxygen at [facility] uses mainly at nighttime  Procedures: none Family communication : son in law in the room Consults : G.I. CODE STATUS: DNR prior to admission DVT Prophylaxis : SCD  Status is: Inpatient  Remains inpatient appropriate because:Ongoing diagnostic testing needed not appropriate for outpatient work up   Dispo: The patient is from: Home              Anticipated d/c is to: SNF              Anticipated d/c date is: 1 day              Patient currently is not medically stable to d/c. patient needs to be seen by G.I. PT evaluation pending. Overall seems to be improving. Hoping to discharge to rehab in a day or two      TOTAL TIME TAKING CARE OF THIS PATIENT: **25* minutes.  >50% time spent on counselling and coordination of care  Note: This dictation was  prepared with Dragon dictation along with smaller phrase technology. Any transcriptional errors that result from this process are unintentional.  Fritzi Mandes M.D    [facility]   CC: Primary care physician; Juluis Pitch, MDPatient ID: Trevor Ferguson, male   DOB: Feb 14, 1926, 86 y.o.   MRN: [id number]

## 2020-03-07 NOTE — Consult Note (Signed)
Cephas Darby, MD [address]  [place]  [place], [place] [address]  Main: [phone]  Fax: [phone] Pager: [phone]   Consultation  Referring Provider:     No ref. provider found Primary Care Physician:  Juluis Pitch, MD Primary Gastroenterologist: Dr. Alice Reichert         Reason for Consultation: Acute diverticulitis  Date of Admission:  03/06/2020 Date of Consultation:  03/07/2020         HPI:   Trevor Ferguson is a 86 y.o. male history of hypertension, prostate cancer, CKD. Patient was recently admitted to [facility] on 7/14 secondary to abdominal pain associated with nausea, vomiting, generalized weakness and presyncope. He was diagnosed with acute distal small bowel diverticulitis based on imaging. On 6/28, patient was evaluated by Dr. Alice Reichert for nonbloody diarrhea, stool studies came back positive for E. coli. He was treated with 5 days course of Cipro. His bowel movements improved after this but continue to remain soft and with intermittent abdominal discomfort. Patient was treated with aztreonam and Flagyl for distal small bowel diverticulitis. His stool studies were negative for C. difficile infection. He was discharged home on 7 days course of Flagyl and Bactrim on 7/17. He returned to ER yesterday secondary to worsening of abdominal pain. There is no evidence of leukocytosis. Patient underwent repeat CT abdomen and pelvis without contrast which revealed acute diverticulitis of the distal small bowel in the right lower abdomen with slight interval worsening of the inflammatory process compared to prior CT. No evidence of abscess or perforation. Patient is started on IV ciprofloxacin and metronidazole. He tolerated full liquids, advance to soft diet today  Patient reports only intermittent abdominal pain, he reports that his abdomen is generally distended and bloated.  He had small soft bowel movement today Thinks he is overall improving   NSAIDs:  None  Antiplts/Anticoagulants/Anti thrombotics: None  GI Procedures: None within last 10 years  Past Medical History:  Diagnosis Date  . COPD (chronic obstructive pulmonary disease) ([place])   . Hypertension   . Pneumonia    [id number],2542 hospitalized  . Prostate cancer ([place])    with seed placement  . Sleep apnea    noncompliant, dx 1996  . Stroke [facility] 2012   no residuals    Past Surgical History:  Procedure Laterality Date  . adenoid    . APPENDECTOMY    . HEMORRHOID SURGERY    . spine cyst    . TONSILLECTOMY      Prior to Admission medications   Medication Sig Start Date End Date Taking? Authorizing Provider  aspirin EC 81 MG tablet Take 1 tablet (81 mg total) by mouth daily. Patient taking differently: Take 81 mg by mouth 2 (two) times daily.  09/20/19  Yes Gollan, Kathlene November, MD  Cholecalciferol (VITAMIN D-3) 25 MCG (1000 UT) CAPS Take 1,000 Units by mouth daily.    Yes [provider]  escitalopram (LEXAPRO) 10 MG tablet Take 10 mg by mouth daily.    Yes [provider]  furosemide (LASIX) 20 MG tablet Take 1 tablet (20 mg total) by mouth daily as needed. Use sparingly. Take potassium when you take furosemide. 08/11/18 10/19/28 Yes Gollan, Kathlene November, MD  losartan-hydrochlorothiazide (HYZAAR) 100-25 MG per tablet Take 1 tablet by mouth daily.   Yes [provider]  potassium chloride SA (K-DUR,KLOR-CON) 20 MEQ tablet Take 1 tablet (20 mEq total) by mouth daily as needed. Take when you take the furosemide. 08/11/18  Yes Minna Merritts, MD  traZODone (DESYREL) 150 MG tablet Take 150 mg by mouth at bedtime.   Yes [provider]  triamcinolone (KENALOG) 0.025 % cream Apply topically 2 (two) times daily. 02/01/20  Yes [provider]  dextromethorphan-guaiFENesin (MUCINEX DM) 30-600 MG 12hr tablet Take 1 tablet by mouth 2 (two) times daily. Patient not taking: Reported on 03/06/2020 03/04/20   Lorella Nimrod, MD  metroNIDAZOLE (FLAGYL) 500 MG  tablet Take 1 tablet (500 mg total) by mouth 3 (three) times daily for 5 days. Patient not taking: Reported on 03/06/2020 03/04/20 03/09/20  Lorella Nimrod, MD  ondansetron (ZOFRAN) 4 MG tablet Take 1 tablet (4 mg total) by mouth every 6 (six) hours as needed for nausea. Patient not taking: Reported on 03/06/2020 03/04/20   Lorella Nimrod, MD  sulfamethoxazole-trimethoprim (BACTRIM DS) 800-160 MG tablet Take 1 tablet by mouth 2 (two) times daily for 5 days. Patient not taking: Reported on 03/06/2020 03/04/20 03/09/20  Lorella Nimrod, MD   Current Facility-Administered Medications:  .  acetaminophen (TYLENOL) tablet 650 mg, 650 mg, Oral, Q6H PRN, 650 mg at 03/07/20 1008 **OR** acetaminophen (TYLENOL) suppository 650 mg, 650 mg, Rectal, Q6H PRN, Para Skeans, MD .  acidophilus (RISAQUAD) capsule 2 capsule, 2 capsule, Oral, TID, Dallie Piles, RPH, 2 capsule at 03/07/20 1628 .  aspirin EC tablet 81 mg, 81 mg, Oral, Daily, Para Skeans, MD, 81 mg at 03/07/20 0846 .  cholecalciferol (VITAMIN D3) tablet 1,000 Units, 1,000 Units, Oral, Daily, Para Skeans, MD, 1,000 Units at 03/07/20 0846 .  ciprofloxacin (CIPRO) IVPB 400 mg, 400 mg, Intravenous, Q24H, Dallie Piles, RPH, Stopped at 03/07/20 1230 .  escitalopram (LEXAPRO) tablet 10 mg, 10 mg, Oral, Daily, Florina Ou V, MD, 10 mg at 03/07/20 0846 .  heparin injection 5,000 Units, 5,000 Units, Subcutaneous, Q8H, Para Skeans, MD, 5,000 Units at 03/07/20 1330 .  hydrALAZINE (APRESOLINE) injection 10 mg, 10 mg, Intravenous, Q4H PRN, Florina Ou V, MD .  metroNIDAZOLE (FLAGYL) IVPB 500 mg, 500 mg, Intravenous, Q8H, Para Skeans, MD, Stopped at 03/07/20 1440 .  pantoprazole (PROTONIX) injection 40 mg, 40 mg, Intravenous, q1800, Para Skeans, MD, 40 mg at 03/07/20 1628 .  traZODone (DESYREL) tablet 150 mg, 150 mg, Oral, QHS, Para Skeans, MD, 150 mg at 03/06/20 2235   Family History  Problem Relation Age of Onset  . Congestive Heart Failure Mother   .  COPD Sister   . Asthma Sister   . Tuberculosis Maternal Grandmother   . Stroke Maternal Grandfather      Social History   Tobacco Use  . Smoking status: Former Smoker    Years: 40.00    Types: Cigarettes    Quit date: 08/19/1985    Years since quitting: 34.5  . Smokeless tobacco: Never Used  Vaping Use  . Vaping Use: Never used  Substance Use Topics  . Alcohol use: Yes    Alcohol/week: 14.0 standard drinks    Types: 14 Shots of liquor per week  . Drug use: No    Allergies as of 03/06/2020 - Review Complete 03/06/2020  Allergen Reaction Noted  . Penicillins Hives, Rash, and Other (See Comments) 03/28/2015  . Bee venom Palpitations 11/13/2013  . Darvon [propoxyphene] Nausea Only and Rash 03/28/2015    Review of Systems:    All systems reviewed and negative except where noted in HPI.   Physical Exam:  Vital signs in last 24 hours: Temp:  [97.5  F (36.4 C)-98.7 F (37.1 C)] 97.5 F (36.4 C) (07/20 1223) Pulse Rate:  [61-73] 67 (07/20 1223) Resp:  [20] 20 (07/20 1223) BP: (119-161)/(61-79) 119/79 (07/20 1223) SpO2:  [94 %-96 %] 96 % (07/20 1223) Weight:  [95.4 kg-98.2 kg] 98.2 kg (07/20 0500) Last BM Date: 03/07/20 General:   Pleasant, cooperative in NAD Head:  Normocephalic and atraumatic. Eyes:   No icterus.   Conjunctiva pink. PERRLA. Ears:  Normal auditory acuity. Neck:  Supple; no masses or thyroidomegaly Lungs: Respirations even and unlabored. Lungs clear to auscultation bilaterally.   No wheezes, crackles, or rhonchi.  Heart:  Regular rate and rhythm;  Without murmur, clicks, rubs or gallops Abdomen:  Soft, diffusely distended, tympanic to percussion, tenderness to percussion in right lower abdomen. Normal bowel sounds. No appreciable masses or hepatomegaly.  No rebound or guarding.  Rectal:  Not performed. Msk:  Symmetrical without gross deformities.  Strength appropriate for his age Extremities:  Without edema, cyanosis or clubbing. Neurologic:  Alert and  oriented x3;  grossly normal neurologically. Skin:  Intact without significant lesions or rashes. Psych:  Alert and cooperative. Normal affect.  LAB RESULTS: CBC Latest Ref Rng & Units 03/07/2020 03/06/2020 03/04/2020  WBC 4.0 - 10.5 K/uL 10.0 9.7 9.9  Hemoglobin 13.0 - 17.0 g/dL 11.4(L) 11.8(L) 10.2(L)  Hematocrit 39 - 52 % 32.4(L) 34.8(L) 29.1(L)  Platelets 150 - 400 K/uL 131(L) 140(L) 99(L)    BMET BMP Latest Ref Rng & Units 03/07/2020 03/06/2020 03/04/2020  Glucose 70 - 99 mg/dL 113(H) 154(H) 99  BUN 8 - 23 mg/dL 35(H) 38(H) 35(H)  Creatinine 0.61 - 1.24 mg/dL 2.20(H) 2.37(H) 2.05(H)  Sodium 135 - 145 mmol/L 138 134(L) 134(L)  Potassium 3.5 - 5.1 mmol/L 4.1 4.2 4.2  Chloride 98 - 111 mmol/L 107 103 104  CO2 22 - 32 mmol/L _0 Calcium 8.9 - 10.3 mg/dL 8.2(L) 8.4(L) 7.8(L)    LFT Hepatic Function Latest Ref Rng & Units 03/07/2020 03/06/2020 03/01/2020  Total Protein 6.5 - 8.1 g/dL 6.3(L) 6.8 6.4(L)  Albumin 3.5 - 5.0 g/dL 3.7 3.7 4.0  AST 15 - 41 U/L _1 ALT 0 - 44 U/L _2 Alk Phosphatase 38 - 126 U/L 52 59 60  Total Bilirubin 0.3 - 1.2 mg/dL 0.8 0.6 1.4(H)  Bilirubin, Direct 0.0 - 0.2 mg/dL - - 0.3(H)     STUDIES: CT ABDOMEN PELVIS WO CONTRAST  Result Date: 03/06/2020 CLINICAL DATA:  86 year old male with abdominal distension. EXAM: CT ABDOMEN AND PELVIS WITHOUT CONTRAST TECHNIQUE: Multidetector CT imaging of the abdomen and pelvis was performed following the standard protocol without IV contrast. COMPARISON:  CT abdomen pelvis dated 03/01/2020. FINDINGS: Evaluation of this exam is limited in the absence of intravenous contrast. Lower chest: Partially visualized small right pleural effusion with associated partial right lung base atelectasis. Pneumonia is not excluded. Clinical correlation is recommended. There is coronary vascular calcification. No intra-abdominal free air. Small perihepatic free fluid. Hepatobiliary: The liver is unremarkable. No intrahepatic  biliary ductal dilatation. Mild pericholecystic stranding, likely related to perihepatic fluid. No calcified gallstone. Pancreas: Mild haziness of the fat adjacent to the head and uncinate process of the pancreas may be related to perihepatic ascites. Correlation with pancreatic enzymes recommended to exclude acute pancreatitis. There is a punctate focus of calcification in the uncinate process of the pancreas, similar to prior CT. Spleen: Normal in size without focal abnormality. Adrenals/Urinary Tract: The adrenal glands unremarkable. There is no hydronephrosis  on either side. There is a 6 mm vascular calcification versus nonobstructing stone in the upper pole of the right kidney. A 1 cm hypodense lesion in the upper pole of the left kidney is not characterized on this noncontrast CT, possibly a cyst. The visualized ureters and urinary bladder appear unremarkable. Stomach/Bowel: There is sigmoid diverticulosis without active inflammatory changes. There is a 4.5 cm distal duodenal diverticulum. There is diverticulosis of the distal small bowel loop in the right lower abdomen with associated active inflammation consistent with acute diverticulitis. Overall slight interval worsening of the inflammatory process since the prior CT. No abscess or perforation. There is no bowel obstruction. Loose stool throughout the colon compatible with diarrheal state. Appendectomy. Vascular/Lymphatic: Advanced aortoiliac atherosclerotic disease. The IVC is unremarkable. No portal venous gas. There is no adenopathy. Reproductive: Prostate brachytherapy seeds. Other: Mild diffuse subcutaneous edema. No fluid collection. Musculoskeletal: Osteopenia with degenerative changes of the spine. No acute osseous pathology. IMPRESSION: 1. Acute diverticulitis of the distal small bowel in the right lower abdomen with slight interval worsening of the inflammatory process since the prior CT. No abscess or perforation. 2. Diarrheal state. No bowel  obstruction. 3. Small right pleural effusion with associated partial right lung base atelectasis. 4. Aortic Atherosclerosis (ICD10-I70.0). Electronically Signed   By: Anner Crete M.D.   On: 03/06/2020 20:53   CT Head Wo Contrast  Result Date: 03/06/2020 CLINICAL DATA:  Headache EXAM: CT HEAD WITHOUT CONTRAST TECHNIQUE: Contiguous axial images were obtained from the base of the skull through the vertex without intravenous contrast. COMPARISON:  CT 03/19/2014 FINDINGS: Brain: No acute territorial infarction, hemorrhage, or intracranial mass. Moderate atrophy. Patchy hypodensity in the white matter consistent with chronic small vessel ischemic change. Stable ventricle size. Chronic lacunar infarcts in the basal ganglia. Vascular: No hyperdense vessels.  Carotid vascular calcification Skull: Normal. Negative for fracture or focal lesion. Sinuses/Orbits: Patchy mucosal thickening in the ethmoid sinuses Other: None IMPRESSION: 1. No CT evidence for acute intracranial abnormality. 2. Atrophy and chronic small vessel ischemic changes of the white matter. Electronically Signed   By: Donavan Foil M.D.   On: 03/06/2020 20:50   DG Chest Portable 1 View  Result Date: 03/06/2020 CLINICAL DATA:  Evaluate pulmonary edema. EXAM: PORTABLE CHEST 1 VIEW COMPARISON:  03/01/2020 FINDINGS: Cardiac enlargement is stable. Small right pleural effusion. Diffuse pulmonary vascular congestion noted. Asymmetric opacity within the right mid lung is noted, new from previous exam. IMPRESSION: 1. New asymmetric opacity within the right mid lung which may represent asymmetric edema versus pneumonia. 2. Cardiac enlargement and pulmonary vascular congestion. Electronically Signed   By: Kerby Moors M.D.   On: 03/06/2020 20:36      Impression / Plan:   Trevor Ferguson is a 86 y.o. male with history of E. coli infection s/p treatment with 5 days course of ciprofloxacin admitted with acute uncomplicated distal small bowel  diverticulitis.   Acute small bowel diverticulitis: Cross-sectional imaging with no evidence of perforation or abscess Latest CT revealed worsening of inflammation Agree with IV antibiotics Cipro and Flagyl for 10 to 14 days No indication for surgery at this time Serial abdominal exams and consult surgery if patient develops acute abdomen Recommend restricted diet, lactose-free, avoid red meat, processed foods, fatty/greasy foods, carbonated beverages  Thank you for involving me in the care of this patient.      LOS: 1 day   Sherri Sear, MD  03/07/2020, 8:13 PM   Note: This dictation was prepared with Viviann Spare  dictation along with smaller phrase technology. Any transcriptional errors that result from this process are unintentional.

## 2020-03-07 NOTE — Evaluation (Signed)
Occupational Therapy Evaluation Patient Details Name: Trevor Ferguson MRN: [id number] DOB: [birth year] Today's Date: 03/07/2020    History of Present Illness 86 y.o. male with a history of COPD, hypertension, prostate CA sleep apnea and CVA, here last week for weakness and vomiting and discharged 7/17, now returns with increased abdominal pain.   Clinical Impression   Trevor Ferguson was seen for OT evaluation this date. Pt was generally independent in all BADL and functional mobility, living alone in a 1-story home with a level entry. Pt reports his wife has been receiving care in a STR since February of this year. Pt is on 3 liters of supplemental O2 at home. Pt reports becoming easily fatigued or out of breath with minimal exertion, which has gotten worse over the past few days. Pt currently requires MOD assist for exertional LB ADL management including bathing and dressing tasks as well as min guard to supervision for safety for functional mobility due to current functional impairments (See OT Problem List below). Pt educated in energy conservation strategies including pursed lip breathing, activity pacing, home/routines modifications, work simplification, AE/DME, prioritizing of meaningful occupations, and falls prevention. Handout provided. Pt verbalized understanding and would benefit from additional skilled OT services to maximize recall and carryover of learned techniques and facilitate implementation of learned techniques into daily routines. Upon discharge, recommend STR to maximize pt safety and return to PLOF.       Follow Up Recommendations  SNF    Equipment Recommendations  Toilet rise with handles    Recommendations for Other Services       Precautions / Restrictions Precautions Precautions: Fall Restrictions Weight Bearing Restrictions: No      Mobility Bed Mobility Overal bed mobility: Modified Independent             General bed mobility comments: Deferred. Pt in  recliner at start/end of session. Per chart, was mod I for bed mobility during PT session.  Transfers Overall transfer level: Needs assistance Equipment used: None Transfers: Sit to/from Stand Sit to Stand: Supervision;Min guard         General transfer comment: Supervision to min guard during STS/functional mobility.    Balance Overall balance assessment: Needs assistance Sitting-balance support: No upper extremity supported;Feet supported Sitting balance-Leahy Scale: Good Sitting balance - Comments: Steady static sitting, reaching within BOS.   Standing balance support: During functional activity;No upper extremity supported Standing balance-Leahy Scale: Fair Standing balance comment: No overt LOB during session. Per PT who was finishing up session as OT arrived, pt noted with staggering steps during 180* turns.                           ADL either performed or assessed with clinical judgement   ADL Overall ADL's : Needs assistance/impaired                                       General ADL Comments: Pt functionally limited by generalized weakness, decreased activity tolerance and cardiopulmonary status. Supervision for safety during functional mobility with frequent therapeutic rest breaks. MOD A for more exertional LB ADL management including don/doff shoes, socks, compression garments, etc.     Vision Baseline Vision/History: Wears glasses Wears Glasses: At all times Patient Visual Report: No change from baseline       Perception     Praxis  Pertinent Vitals/Pain Pain Assessment: No/denies pain     Hand Dominance Right   Extremity/Trunk Assessment Upper Extremity Assessment Upper Extremity Assessment: Generalized weakness   Lower Extremity Assessment Lower Extremity Assessment: Generalized weakness;Overall [facility] for tasks assessed   Cervical / Trunk Assessment Cervical / Trunk Assessment: Normal   Communication  Communication Communication: No difficulties   Cognition Arousal/Alertness: Awake/alert Behavior During Therapy: WFL for tasks assessed/performed Overall Cognitive Status: Within Functional Limits for tasks assessed                                 General Comments: Pleasant, conversational.   General Comments  Pt vitals remain WFLs (spO2 >92%) t/o session w/ pt on 3L which is his baseline.    Exercises Other Exercises Other Exercises: Pt educated on falls prevention strategies, safe use of AE/DME for ADL management, and energy conservation strategies including PLB and activity pacing. Handout provided.   Shoulder Instructions      Home Living Family/patient expects to be discharged to:: Private residence [facility] [place]) Living Arrangements: Alone (Wife currently in SNF) Available Help at Discharge: Personal care attendant;Available PRN/intermittently Type of Home: Independent living facility Home Access: Level entry     Home Layout: One level     Bathroom Shower/Tub: Walk-in shower;Tub/shower unit   Bathroom Toilet: Standard Bathroom Accessibility: Yes   Home Equipment: [place]io - 4 wheels;Cane - single point;Shower seat;Grab bars - tub/shower;Vorce - 2 wheels   Additional Comments: Wife at SNF since Feb       Prior Functioning/Environment Level of Independence: Independent        Comments: Pt was unable to rise from kneeling to vomit last week, reports he has not had a true fall in >1 year.        OT Problem List: Decreased strength;Decreased activity tolerance;Decreased safety awareness;Decreased knowledge of use of DME or AE;Cardiopulmonary status limiting activity;Impaired balance (sitting and/or standing)      OT Treatment/Interventions: Self-care/ADL training;Therapeutic exercise;Energy conservation;DME and/or AE instruction;Therapeutic activities;Patient/family education;Balance training    OT Goals(Current goals can be found in the care plan  section) Acute Rehab OT Goals Patient Stated Goal: improve strength, balance, activity tolerance at rehab before going home OT Goal Formulation: With patient Time For Goal Achievement: 03/21/20 Potential to Achieve Goals: Good ADL Goals Pt Will Perform Lower Body Dressing: sit to/from stand;with supervision;with set-up;with adaptive equipment (c LRAD PRN for improved safety.) Pt Will Transfer to Toilet: ambulating;bedside commode;with set-up;with supervision (c LRAD PRN for improved safety.) Pt Will Perform Toileting - Clothing Manipulation and hygiene: sit to/from stand;with supervision;with set-up (c LRAD PRN for improved safety.) Additional ADL Goal #1: Pt will independently verbalize a plan to implement 3 ECS into his daily routines/home environment for improved safety and functional independence upon hospital DC.  OT Frequency: Min 2X/week   Barriers to D/C: Decreased caregiver support          Co-evaluation              AM-PAC OT "6 Clicks" Daily Activity     Outcome Measure Help from another person eating meals?: A Little Help from another person taking care of personal grooming?: A Little Help from another person toileting, which includes using toliet, bedpan, or urinal?: A Little Help from another person bathing (including washing, rinsing, drying)?: A Lot Help from another person to put on and taking off regular upper body clothing?: A Little Help from another person to  put on and taking off regular lower body clothing?: A Lot 6 Click Score: 16   End of Session Equipment Utilized During Treatment: Gait belt  Activity Tolerance: Patient tolerated treatment well Patient left: with call bell/phone within reach;in chair;with chair alarm set  OT Visit Diagnosis: Other abnormalities of gait and mobility (R26.89);Muscle weakness (generalized) (M62.81)                Time: 2341-4436 OT Time Calculation (min): 17 min Charges:  OT General Charges $OT Visit: 1 Visit OT  Evaluation $OT Eval Moderate Complexity: 1 Mod OT Treatments $Self Care/Home Management : 8-22 mins  Shara Blazing, M.S., OTR/L Ascom: [phone] 03/07/20, 4:02 PM

## 2020-03-07 NOTE — Progress Notes (Signed)
Patient had shortness of breath with exertion so I notified Sharion Settler regarding breathing treatments. Appropriate orders were placed.  Will continue to monitor.  Christene Slates  03/07/2020   11:35 PM

## 2020-03-08 DIAGNOSIS — F329 Major depressive disorder, single episode, unspecified: Secondary | ICD-10-CM

## 2020-03-08 DIAGNOSIS — R531 Weakness: Secondary | ICD-10-CM

## 2020-03-08 DIAGNOSIS — F32A Depression, unspecified: Secondary | ICD-10-CM

## 2020-03-08 DIAGNOSIS — I10 Essential (primary) hypertension: Secondary | ICD-10-CM

## 2020-03-08 MED ORDER — PIPERACILLIN-TAZOBACTAM 3.375 G IVPB
3.3750 g | Freq: Three times a day (TID) | INTRAVENOUS | Status: DC
  Administered 2020-03-08 – 2020-03-13 (×15): 3.375 g via INTRAVENOUS
  Filled 2020-03-08 (×14): qty 50

## 2020-03-08 MED ORDER — SODIUM CHLORIDE 0.9 % IV BOLUS
500.0000 mL | Freq: Once | INTRAVENOUS | Status: AC
  Administered 2020-03-08: 500 mL via INTRAVENOUS

## 2020-03-08 NOTE — Progress Notes (Signed)
Trevor Darby, MD [address]  [place]  [place], [place] [address]  Main: [phone]  Fax: [phone] Pager: [phone]   Subjective: Patient is sitting up in chair, comfortable.  He reports having explosive bowel movement, small volume only.  He does report ongoing lower abdominal discomfort which is intermittent.  His appetite is good, tolerating soft diet well   Objective: Vital signs in last 24 hours: Vitals:   03/07/20 2027 03/08/20 0452 03/08/20 0500 03/08/20 1156  BP: (!) 150/64 (!) 131/57  (!) 130/55  Pulse: 77 79  67  Resp: 20 20  20   Temp: 98 F (36.7 C) 98.2 F (36.8 C)  98 F (36.7 C)  TempSrc: Oral   Oral  SpO2: 94% 93%  96%  Weight:   99 kg   Height:       Weight change: 4 kg  Intake/Output Summary (Last 24 hours) at 03/08/2020 1455 Last data filed at 03/08/2020 0522 Gross per 24 hour  Intake 980 ml  Output 115 ml  Net 865 ml     Exam: Heart:: Regular rate and rhythm, S1S2 present or without murmur or extra heart sounds Lungs: normal and clear to auscultation Abdomen: soft, nontender, normal bowel sounds   Lab Results: CBC Latest Ref Rng & Units 03/07/2020 03/06/2020 03/04/2020  WBC 4.0 - 10.5 K/uL 10.0 9.7 9.9  Hemoglobin 13.0 - 17.0 g/dL 11.4(L) 11.8(L) 10.2(L)  Hematocrit 39 - 52 % 32.4(L) 34.8(L) 29.1(L)  Platelets 150 - 400 K/uL 131(L) 140(L) 99(L)   BMP Latest Ref Rng & Units 03/07/2020 03/06/2020 03/04/2020  Glucose 70 - 99 mg/dL 113(H) 154(H) 99  BUN 8 - 23 mg/dL 35(H) 38(H) 35(H)  Creatinine 0.61 - 1.24 mg/dL 2.20(H) 2.37(H) 2.05(H)  Sodium 135 - 145 mmol/L 138 134(L) 134(L)  Potassium 3.5 - 5.1 mmol/L 4.1 4.2 4.2  Chloride 98 - 111 mmol/L 107 103 104  CO2 22 - 32 mmol/L 24 23 24   Calcium 8.9 - 10.3 mg/dL 8.2(L) 8.4(L) 7.8(L)    Micro Results: Recent Results (from the past 240 hour(s))  SARS Coronavirus 2 by RT PCR (hospital order, performed in [facility] hospital lab) Nasopharyngeal Nasopharyngeal Swab     Status:  None   Collection Time: 03/01/20  8:53 PM   Specimen: Nasopharyngeal Swab  Result Value Ref Range Status   SARS Coronavirus 2 NEGATIVE NEGATIVE Final    Comment: (NOTE) SARS-CoV-2 target nucleic acids are NOT DETECTED.  The SARS-CoV-2 RNA is generally detectable in upper and lower respiratory specimens during the acute phase of infection. The lowest concentration of SARS-CoV-2 viral copies this assay can detect is 250 copies / mL. A negative result does not preclude SARS-CoV-2 infection and should not be used as the sole basis for treatment or other patient management decisions.  A negative result may occur with improper specimen collection / handling, submission of specimen other than nasopharyngeal swab, presence of viral mutation(s) within the areas targeted by this assay, and inadequate number of viral copies (<250 copies / mL). A negative result must be combined with clinical observations, patient history, and epidemiological information.  Fact Sheet for Patients:   StrictlyIdeas.no  Fact Sheet for Healthcare Providers: BankingDealers.co.za  This test is not yet approved or  cleared by the [place] FDA and has been authorized for detection and/or diagnosis of SARS-CoV-2 by FDA under an Emergency Use Authorization (EUA).  This EUA will remain in effect (meaning this test can be used) for the duration of  the COVID-19 declaration under Section 564(b)(1) of the Act, 21 U.S.C. section 360bbb-3(b)(1), unless the authorization is terminated or revoked sooner.  Performed at [facility], [address] [place], [place] [address]   Blood Culture (routine x 2)     Status: None   Collection Time: 03/01/20  8:53 PM   Specimen: BLOOD  Result Value Ref Range Status   Specimen Description BLOOD BLOOD LEFT WRIST  Final   Special Requests   Final    BOTTLES DRAWN AEROBIC AND ANAEROBIC Blood Culture results may not be optimal  due to an inadequate volume of blood received in culture bottles   Culture   Final    NO GROWTH 5 DAYS Performed at [facility], [address] [place], [place] [address]    Report Status 03/06/2020 FINAL  Final  Blood Culture (routine x 2)     Status: None   Collection Time: 03/01/20  8:53 PM   Specimen: BLOOD  Result Value Ref Range Status   Specimen Description BLOOD RIGHT ANTECUBITAL  Final   Special Requests   Final    BOTTLES DRAWN AEROBIC AND ANAEROBIC Blood Culture adequate volume   Culture   Final    NO GROWTH 5 DAYS Performed at [facility], [address], [place], [place] [address]    Report Status 03/06/2020 FINAL  Final  C Difficile Quick Screen w PCR reflex     Status: None   Collection Time: 03/03/20  6:13 PM   Specimen: STOOL  Result Value Ref Range Status   C Diff antigen NEGATIVE NEGATIVE Final   C Diff toxin NEGATIVE NEGATIVE Final   C Diff interpretation No C. difficile detected.  Final    Comment: Performed at [facility], [address] [place], [place] [address]  SARS Coronavirus 2 by RT PCR (hospital order, performed in [facility] hospital lab) Nasopharyngeal Nasopharyngeal Swab     Status: None   Collection Time: 03/06/20  9:23 PM   Specimen: Nasopharyngeal Swab  Result Value Ref Range Status   SARS Coronavirus 2 NEGATIVE NEGATIVE Final    Comment: (NOTE) SARS-CoV-2 target nucleic acids are NOT DETECTED.  The SARS-CoV-2 RNA is generally detectable in upper and lower respiratory specimens during the acute phase of infection. The lowest concentration of SARS-CoV-2 viral copies this assay can detect is 250 copies / mL. A negative result does not preclude SARS-CoV-2 infection and should not be used as the sole basis for treatment or other patient management decisions.  A negative result may occur with improper specimen collection / handling, submission of specimen other than nasopharyngeal swab, presence of viral  mutation(s) within the areas targeted by this assay, and inadequate number of viral copies (<250 copies / mL). A negative result must be combined with clinical observations, patient history, and epidemiological information.  Fact Sheet for Patients:   StrictlyIdeas.no  Fact Sheet for Healthcare Providers: BankingDealers.co.za  This test is not yet approved or  cleared by the [place] FDA and has been authorized for detection and/or diagnosis of SARS-CoV-2 by FDA under an Emergency Use Authorization (EUA).  This EUA will remain in effect (meaning this test can be used) for the duration of the COVID-19 declaration under Section 564(b)(1) of the Act, 21 U.S.C. section 360bbb-3(b)(1), unless the authorization is terminated or revoked sooner.  Performed at [facility], [address], [place], [place] [address]    Studies/Results: CT ABDOMEN PELVIS WO CONTRAST  Result Date: 03/06/2020 CLINICAL  DATA:  86 year old male with abdominal distension. EXAM: CT ABDOMEN AND PELVIS WITHOUT CONTRAST TECHNIQUE: Multidetector CT imaging of the abdomen and pelvis was performed following the standard protocol without IV contrast. COMPARISON:  CT abdomen pelvis dated 03/01/2020. FINDINGS: Evaluation of this exam is limited in the absence of intravenous contrast. Lower chest: Partially visualized small right pleural effusion with associated partial right lung base atelectasis. Pneumonia is not excluded. Clinical correlation is recommended. There is coronary vascular calcification. No intra-abdominal free air. Small perihepatic free fluid. Hepatobiliary: The liver is unremarkable. No intrahepatic biliary ductal dilatation. Mild pericholecystic stranding, likely related to perihepatic fluid. No calcified gallstone. Pancreas: Mild haziness of the fat adjacent to the head and uncinate process of the pancreas may be related to perihepatic ascites.  Correlation with pancreatic enzymes recommended to exclude acute pancreatitis. There is a punctate focus of calcification in the uncinate process of the pancreas, similar to prior CT. Spleen: Normal in size without focal abnormality. Adrenals/Urinary Tract: The adrenal glands unremarkable. There is no hydronephrosis on either side. There is a 6 mm vascular calcification versus nonobstructing stone in the upper pole of the right kidney. A 1 cm hypodense lesion in the upper pole of the left kidney is not characterized on this noncontrast CT, possibly a cyst. The visualized ureters and urinary bladder appear unremarkable. Stomach/Bowel: There is sigmoid diverticulosis without active inflammatory changes. There is a 4.5 cm distal duodenal diverticulum. There is diverticulosis of the distal small bowel loop in the right lower abdomen with associated active inflammation consistent with acute diverticulitis. Overall slight interval worsening of the inflammatory process since the prior CT. No abscess or perforation. There is no bowel obstruction. Loose stool throughout the colon compatible with diarrheal state. Appendectomy. Vascular/Lymphatic: Advanced aortoiliac atherosclerotic disease. The IVC is unremarkable. No portal venous gas. There is no adenopathy. Reproductive: Prostate brachytherapy seeds. Other: Mild diffuse subcutaneous edema. No fluid collection. Musculoskeletal: Osteopenia with degenerative changes of the spine. No acute osseous pathology. IMPRESSION: 1. Acute diverticulitis of the distal small bowel in the right lower abdomen with slight interval worsening of the inflammatory process since the prior CT. No abscess or perforation. 2. Diarrheal state. No bowel obstruction. 3. Small right pleural effusion with associated partial right lung base atelectasis. 4. Aortic Atherosclerosis (ICD10-I70.0). Electronically Signed   By: Anner Crete M.D.   On: 03/06/2020 20:53   CT Head Wo Contrast  Result Date:  03/06/2020 CLINICAL DATA:  Headache EXAM: CT HEAD WITHOUT CONTRAST TECHNIQUE: Contiguous axial images were obtained from the base of the skull through the vertex without intravenous contrast. COMPARISON:  CT 03/19/2014 FINDINGS: Brain: No acute territorial infarction, hemorrhage, or intracranial mass. Moderate atrophy. Patchy hypodensity in the white matter consistent with chronic small vessel ischemic change. Stable ventricle size. Chronic lacunar infarcts in the basal ganglia. Vascular: No hyperdense vessels.  Carotid vascular calcification Skull: Normal. Negative for fracture or focal lesion. Sinuses/Orbits: Patchy mucosal thickening in the ethmoid sinuses Other: None IMPRESSION: 1. No CT evidence for acute intracranial abnormality. 2. Atrophy and chronic small vessel ischemic changes of the white matter. Electronically Signed   By: Donavan Foil M.D.   On: 03/06/2020 20:50   DG Chest Portable 1 View  Result Date: 03/06/2020 CLINICAL DATA:  Evaluate pulmonary edema. EXAM: PORTABLE CHEST 1 VIEW COMPARISON:  03/01/2020 FINDINGS: Cardiac enlargement is stable. Small right pleural effusion. Diffuse pulmonary vascular congestion noted. Asymmetric opacity within the right mid lung is noted, new from previous exam. IMPRESSION: 1. New asymmetric opacity  within the right mid lung which may represent asymmetric edema versus pneumonia. 2. Cardiac enlargement and pulmonary vascular congestion. Electronically Signed   By: Kerby Moors M.D.   On: 03/06/2020 20:36   Medications:  I have reviewed the patient's current medications. Prior to Admission:  Medications Prior to Admission  Medication Sig Dispense Refill Last Dose  . aspirin EC 81 MG tablet Take 1 tablet (81 mg total) by mouth daily. (Patient taking differently: Take 81 mg by mouth 2 (two) times daily. ) 90 tablet 3 03/06/2020 at 0930  . Cholecalciferol (VITAMIN D-3) 25 MCG (1000 UT) CAPS Take 1,000 Units by mouth daily.    03/06/2020 at 0930  .  escitalopram (LEXAPRO) 10 MG tablet Take 10 mg by mouth daily.    03/06/2020 at 0930  . furosemide (LASIX) 20 MG tablet Take 1 tablet (20 mg total) by mouth daily as needed. Use sparingly. Take potassium when you take furosemide. 30 tablet 6 PRN at PRN  . losartan-hydrochlorothiazide (HYZAAR) 100-25 MG per tablet Take 1 tablet by mouth daily.   03/06/2020 at 0930  . potassium chloride SA (K-DUR,KLOR-CON) 20 MEQ tablet Take 1 tablet (20 mEq total) by mouth daily as needed. Take when you take the furosemide. 30 tablet 6 PRN at PRN  . traZODone (DESYREL) 150 MG tablet Take 150 mg by mouth at bedtime.   03/05/2020 at Unknown time  . triamcinolone (KENALOG) 0.025 % cream Apply topically 2 (two) times daily.   03/06/2020 at 0930  . dextromethorphan-guaiFENesin (MUCINEX DM) 30-600 MG 12hr tablet Take 1 tablet by mouth 2 (two) times daily. (Patient not taking: Reported on 03/06/2020) 30 tablet 0 Not Taking at Unknown time  . metroNIDAZOLE (FLAGYL) 500 MG tablet Take 1 tablet (500 mg total) by mouth 3 (three) times daily for 5 days. (Patient not taking: Reported on 03/06/2020) 15 tablet 0 Not Taking at Unknown time  . ondansetron (ZOFRAN) 4 MG tablet Take 1 tablet (4 mg total) by mouth every 6 (six) hours as needed for nausea. (Patient not taking: Reported on 03/06/2020) 20 tablet 0 Not Taking at Unknown time  . sulfamethoxazole-trimethoprim (BACTRIM DS) 800-160 MG tablet Take 1 tablet by mouth 2 (two) times daily for 5 days. (Patient not taking: Reported on 03/06/2020) 10 tablet 0 Not Taking at Unknown time   Scheduled: . acidophilus  2 capsule Oral TID  . aspirin EC  81 mg Oral Daily  . cholecalciferol  1,000 Units Oral Daily  . escitalopram  10 mg Oral Daily  . heparin  5,000 Units Subcutaneous Q8H  . pantoprazole (PROTONIX) IV  40 mg Intravenous q1800  . traZODone  150 mg Oral QHS   Continuous: . piperacillin-tazobactam (ZOSYN)  IV 3.375 g (03/08/20 1239)   TFT:[id number] **OR** acetaminophen,  hydrALAZINE, ipratropium-albuterol Anti-infectives (From admission, onward)   Start     Dose/Rate Route Frequency Ordered Stop   03/08/20 1200  piperacillin-tazobactam (ZOSYN) IVPB 3.375 g     Discontinue     3.375 g 12.5 mL/hr over 240 Minutes Intravenous Every 8 hours 03/08/20 1036     03/07/20 1200  ciprofloxacin (CIPRO) IVPB 400 mg  Status:  Discontinued        400 mg 200 mL/hr over 60 Minutes Intravenous Every 24 hours 03/07/20 1024 03/08/20 1035   03/07/20 0700  aztreonam (AZACTAM) 1 g in sodium chloride 0.9 % 100 mL IVPB  Status:  Discontinued        1 g 200 mL/hr over 30 Minutes Intravenous Every  8 hours 03/06/20 2147 03/07/20 1024   03/06/20 2200  aztreonam (AZACTAM) 1 g in sodium chloride 0.9 % 100 mL IVPB  Status:  Discontinued        1 g 200 mL/hr over 30 Minutes Intravenous Every 8 hours 03/06/20 2123 03/06/20 2147   03/06/20 2200  metroNIDAZOLE (FLAGYL) IVPB 500 mg  Status:  Discontinued        500 mg 100 mL/hr over 60 Minutes Intravenous Every 8 hours 03/06/20 2147 03/08/20 1035   03/06/20 2130  metroNIDAZOLE (FLAGYL) IVPB 500 mg        500 mg 100 mL/hr over 60 Minutes Intravenous  Once 03/06/20 2123 03/06/20 2237     Scheduled Meds: . acidophilus  2 capsule Oral TID  . aspirin EC  81 mg Oral Daily  . cholecalciferol  1,000 Units Oral Daily  . escitalopram  10 mg Oral Daily  . heparin  5,000 Units Subcutaneous Q8H  . pantoprazole (PROTONIX) IV  40 mg Intravenous q1800  . traZODone  150 mg Oral QHS   Continuous Infusions: . piperacillin-tazobactam (ZOSYN)  IV 3.375 g (03/08/20 1239)   PRN Meds:.acetaminophen **OR** acetaminophen, hydrALAZINE, ipratropium-albuterol   Assessment: Active Problems:   COPD (chronic obstructive pulmonary disease) (HCC)   Chronic diastolic CHF (congestive heart failure) (HCC)   Essential hypertension   Diverticulitis   Abdominal pain   Acute kidney injury superimposed on CKD ([place])   Anemia  Trevor Ferguson is a 86 y.o. male with  history of E. coli infection s/p treatment with 5 days course of ciprofloxacin admitted with acute uncomplicated distal small bowel diverticulitis.  Plan: Acute small bowel diverticulitis: Cross-sectional imaging with no evidence of perforation or abscess Latest CT revealed worsening of inflammation Agree with IV antibiotics Cipro and Flagyl for 10 to 14 days No indication for surgery at this time Serial abdominal exams and consult surgery if patient develops acute abdomen Recommend restricted diet, lactose-free, avoid red meat, processed foods, fatty/greasy foods, carbonated beverages   LOS: 2 days   Nettie Wyffels 03/08/2020, 2:55 PM

## 2020-03-08 NOTE — TOC Initial Note (Signed)
Transition of Care [facility]) - Initial/Assessment Note    Patient Details  Name: Trevor Ferguson MRN: [id number] Date of Birth: Oct 17, 1925  [facility] CM/SW Contact:    Candie Chroman, LCSW Phone Number: 03/08/2020, 9:03 AM  Clinical Narrative: CSW met with patient. No supports at bedside. CSW introduced role and explained that PT recommendations would be discussed. Patient confirmed he is from [place] and is agreeable to going to their SNF side for some short-term rehab. No further concerns. CSW encouraged patient to contact CSW as needed. CSW will continue to follow patient for support and facilitate discharge to SNF once medically stable.                 Expected Discharge Plan: Skilled Nursing Facility Barriers to Discharge: Continued Medical Work up, Ship broker   Patient Goals and CMS Choice     Choice offered to / list presented to : NA  Expected Discharge Plan and Services Expected Discharge Plan: [place] Choice: [place] Living arrangements for the past 2 months: [place]                                      Prior Living Arrangements/Services Living arrangements for the past 2 months: [place] Lives with:: Self (Wife in SNF) Patient language and need for interpreter reviewed:: Yes Do you feel safe going back to the place where you live?: Yes      Need for Family Participation in Patient Care: Yes (Comment) Care giver support system in place?: Yes (comment)   Criminal Activity/Legal Involvement Pertinent to Current Situation/Hospitalization: No - Comment as needed  Activities of Daily Living Home Assistive Devices/Equipment: Eyeglasses, Hearing aid, Oxygen ADL Screening (condition at time of admission) Patient's cognitive ability adequate to safely complete daily activities?: Yes Is the patient deaf or have difficulty hearing?: No Does  the patient have difficulty seeing, even when wearing glasses/contacts?: No Does the patient have difficulty concentrating, remembering, or making decisions?: No Patient able to express need for assistance with ADLs?: Yes Does the patient have difficulty dressing or bathing?: No Independently performs ADLs?: Yes (appropriate for developmental age) Does the patient have difficulty walking or climbing stairs?: Yes Weakness of Legs: Both Weakness of Arms/Hands: None  Permission Sought/Granted Permission sought to share information with : Facility Art therapist granted to share information with : Yes, Verbal Permission Granted     Permission granted to share info w AGENCY: [place]        Emotional Assessment Appearance:: Appears stated age Attitude/Demeanor/Rapport: Engaged Affect (typically observed): Accepting, Appropriate, Calm Orientation: : Oriented to Self, Oriented to Place, Oriented to  Time, Oriented to Situation Alcohol / Substance Use: Not Applicable Psych Involvement: No (comment)  Admission diagnosis:  Diverticulitis [K57.92] Abdominal pain, unspecified abdominal location [R10.9] Patient Active Problem List   Diagnosis Date Noted  . Abdominal pain 03/06/2020  . Acute kidney injury superimposed on CKD ([place]) 03/06/2020  . Anemia 03/06/2020  . Diverticulitis 03/01/2020  . Pneumonia 07/17/2018  . Leg swelling 12/18/2015  . Chronic diastolic CHF (congestive heart failure) ([place]) 07/18/2015  . Essential hypertension 07/18/2015  . COPD (chronic obstructive pulmonary disease) ([place]) 03/28/2015  . Sleep apnea 03/28/2015  . SOB (shortness of breath) 03/28/2015   PCP:  Juluis Pitch, MD Pharmacy:   [facility] [phone] -  [place], [place] [address] [place] [address] Phone: [phone] Fax: [phone]     Social Determinants of Health ([place])  Interventions    Readmission Risk Interventions Readmission Risk Prevention Plan 07/19/2018  Transportation Screening Complete  PCP or Specialist Appt within 5-7 Days Complete  Home Care Screening Complete  Medication Review (RN CM) Complete  Some recent data might be hidden

## 2020-03-08 NOTE — NC FL2 (Signed)
[place] LEVEL OF CARE SCREENING TOOL     IDENTIFICATION  Patient Name: Trevor Ferguson Birthdate: Jul 30, 1926 Sex: male Admission Date (Current Location): 03/06/2020  [facility] and [place] Number:  Engineering geologist and Address:  [facility] [place], [place] [address], [place], [place] [address]      Provider Number: [id number]  Attending Physician Name and Address:  Loletha Grayer, MD  Relative Name and Phone Number:       Current Level of Care: Hospital Recommended Level of Care: [place] Prior Approval Number:    Date Approved/Denied:   PASRR Number: [id number]  Discharge Plan: SNF    Current Diagnoses: Patient Active Problem List   Diagnosis Date Noted  . Abdominal pain 03/06/2020  . Acute kidney injury superimposed on CKD ([place]) 03/06/2020  . Anemia 03/06/2020  . Diverticulitis 03/01/2020  . Pneumonia 07/17/2018  . Leg swelling 12/18/2015  . Chronic diastolic CHF (congestive heart failure) ([place]) 07/18/2015  . Essential hypertension 07/18/2015  . COPD (chronic obstructive pulmonary disease) ([place]) 03/28/2015  . Sleep apnea 03/28/2015  . SOB (shortness of breath) 03/28/2015    Orientation RESPIRATION BLADDER Height & Weight     Self, Time, Situation, Place  O2 (Nasal Canula 3 L) Continent Weight: 218 lb 4.1 oz (99 kg) Height:  5\' 9"  (175.3 cm)  BEHAVIORAL SYMPTOMS/MOOD NEUROLOGICAL BOWEL NUTRITION STATUS   (None)  (None) Continent Diet (Soft. Lactose free.)  AMBULATORY STATUS COMMUNICATION OF NEEDS Skin   Limited Assist Verbally Bruising                       Personal Care Assistance Level of Assistance  Bathing, Feeding, Dressing Bathing Assistance: Limited assistance Feeding assistance: Independent Dressing Assistance: Limited assistance     Functional Limitations Info  Sight, Hearing, Speech Sight Info: Adequate Hearing Info: Adequate Speech Info: Adequate    SPECIAL CARE FACTORS FREQUENCY   PT (By licensed PT), OT (By licensed OT)     PT Frequency: 5 x week OT Frequency: 5 x week            Contractures Contractures Info: Not present    Additional Factors Info  Code Status, Allergies Code Status Info: DNR Allergies Info: Penicillins, Bee Venom, Darvon (Propoxyphene)           Current Medications (03/08/2020):  This is the current hospital active medication list Current Facility-Administered Medications  Medication Dose Route Frequency Provider Last Rate Last Admin  . acetaminophen (TYLENOL) tablet 650 mg  650 mg Oral Q6H PRN Para Skeans, MD   650 mg at 03/07/20 1008   Or  . acetaminophen (TYLENOL) suppository 650 mg  650 mg Rectal Q6H PRN Para Skeans, MD      . acidophilus (RISAQUAD) capsule 2 capsule  2 capsule Oral TID Dallie Piles, RPH   2 capsule at 03/07/20 2051  . aspirin EC tablet 81 mg  81 mg Oral Daily Para Skeans, MD   81 mg at 03/07/20 0846  . cholecalciferol (VITAMIN D3) tablet 1,000 Units  1,000 Units Oral Daily Para Skeans, MD   1,000 Units at 03/07/20 0846  . ciprofloxacin (CIPRO) IVPB 400 mg  400 mg Intravenous Q24H Dallie Piles, [facility]   Stopped at 03/07/20 1230  . escitalopram (LEXAPRO) tablet 10 mg  10 mg Oral Daily Para Skeans, MD   10 mg at 03/07/20 0846  . heparin injection 5,000 Units  5,000  Units Subcutaneous Q8H Florina Ou V, MD   5,000 Units at 03/08/20 9432  . hydrALAZINE (APRESOLINE) injection 10 mg  10 mg Intravenous Q4H PRN Para Skeans, MD      . ipratropium-albuterol (DUONEB) 0.5-2.5 (3) MG/3ML nebulizer solution 3 mL  3 mL Nebulization Q4H PRN Sharion Settler, NP   3 mL at 03/07/20 2158  . metroNIDAZOLE (FLAGYL) IVPB 500 mg  500 mg Intravenous Q8H Florina Ou V, MD 100 mL/hr at 03/08/20 0521 500 mg at 03/08/20 0521  . pantoprazole (PROTONIX) injection 40 mg  40 mg Intravenous q1800 Para Skeans, MD   40 mg at 03/07/20 1628  . traZODone (DESYREL) tablet 150 mg  150 mg Oral QHS Para Skeans, MD   150 mg at 03/07/20  2234     Discharge Medications: Please see discharge summary for a list of discharge medications.  Relevant Imaging Results:  Relevant Lab Results:   Additional Information SS#: [id number]  Candie Chroman, LCSW

## 2020-03-08 NOTE — Care Management Important Message (Signed)
Important Message  Patient Details  Name: Trevor Ferguson MRN: [id number] Date of Birth: [birth year]   Medicare Important Message Given:  Yes  Initial Medicare IM given by Patient Access Associate on 03/07/2020 at 9:48am.     Dannette Barbara 03/08/2020, 4:46 PM

## 2020-03-08 NOTE — Progress Notes (Signed)
Patient ID: Trevor Ferguson, male   DOB: Feb 26, 1926, 86 y.o.   MRN: [id number] [facility] PROGRESS NOTE  Trevor Ferguson LDJ:[id number] DOB: [birth year] DOA: 03/06/2020 PCP: Juluis Pitch, MD  HPI/Subjective: Patient not feeling any better.  Still has abdominal pain.  Having some diarrhea.  Patient was admitted with abdominal pain and diverticulitis.  Patient also has some shortness of breath.  Objective: Vitals:   03/08/20 0452 03/08/20 1156  BP: (!) 131/57 (!) 130/55  Pulse: 79 67  Resp: 20 20  Temp: 98.2 F (36.8 C) 98 F (36.7 C)  SpO2: 93% 96%    Intake/Output Summary (Last 24 hours) at 03/08/2020 1709 Last data filed at 03/08/2020 1504 Gross per 24 hour  Intake 610.08 ml  Output 115 ml  Net 495.08 ml   Filed Weights   03/07/20 0043 03/07/20 0500 03/08/20 0500  Weight: 95.4 kg 98.2 kg 99 kg    ROS: Review of Systems  Respiratory: Positive for shortness of breath.   Cardiovascular: Negative for chest pain.  Gastrointestinal: Positive for abdominal pain and diarrhea. Negative for nausea and vomiting.   Exam: Physical Exam HENT:     Nose: No mucosal edema.     Mouth/Throat:     Pharynx: No oropharyngeal exudate.  Eyes:     General: Lids are normal.     Conjunctiva/sclera: Conjunctivae normal.     Pupils: Pupils are equal, round, and reactive to light.  Cardiovascular:     Rate and Rhythm: Normal rate and regular rhythm.     Heart sounds: S1 normal and S2 normal.  Pulmonary:     Breath sounds: No decreased breath sounds, wheezing, rhonchi or rales.  Abdominal:     Palpations: Abdomen is soft.     Tenderness: There is generalized abdominal tenderness and tenderness in the left upper quadrant and left lower quadrant.  Musculoskeletal:     Right lower leg: No swelling.     Left lower leg: No swelling.  Skin:    General: Skin is warm.     Findings: No rash.     Nails: There is clubbing.  Neurological:     Mental Status: He is alert and oriented to  person, place, and time.       Data Reviewed: Basic Metabolic Panel: Recent Labs  Lab 03/02/20 0621 03/03/20 0420 03/04/20 0338 03/06/20 1628 03/07/20 0112  NA 137 133* 134* 134* 138  K 3.7 4.4 4.2 4.2 4.1  CL 104 103 104 103 107  CO2 24 20* 24 23 24   GLUCOSE 127* 107* 99 154* 113*  BUN 39* 38* 35* 38* 35*  CREATININE 1.95* 2.34* 2.05* 2.37* 2.20*  CALCIUM 8.3* 7.6* 7.8* 8.4* 8.2*   Liver Function Tests: Recent Labs  Lab 03/01/20 1907 03/06/20 1628 03/07/20 0112  AST 17 17 16   ALT 12 12 12   ALKPHOS 60 59 52  BILITOT 1.4* 0.6 0.8  PROT 6.4* 6.8 6.3*  ALBUMIN 4.0 3.7 3.7   Recent Labs  Lab 03/01/20 1907 03/06/20 1628  LIPASE 36 31   CBC: Recent Labs  Lab 03/02/20 0621 03/03/20 0420 03/04/20 0338 03/06/20 1628 03/07/20 0112  WBC 20.2* 13.6* 9.9 9.7 10.0  HGB 11.1* 10.0* 10.2* 11.8* 11.4*  HCT 31.2* 27.9* 29.1* 34.8* 32.4*  MCV 90.2 91.2 92.7 93.8 92.0  PLT 126* 106* 99* 140* 131*   BNP (last 3 results) Recent Labs    03/01/20 1907 03/06/20 1628  BNP 331.9* 445.3*     Recent Results (from  the past 240 hour(s))  SARS Coronavirus 2 by RT PCR (hospital order, performed in [facility] hospital lab) Nasopharyngeal Nasopharyngeal Swab     Status: None   Collection Time: 03/01/20  8:53 PM   Specimen: Nasopharyngeal Swab  Result Value Ref Range Status   SARS Coronavirus 2 NEGATIVE NEGATIVE Final    Comment: (NOTE) SARS-CoV-2 target nucleic acids are NOT DETECTED.  The SARS-CoV-2 RNA is generally detectable in upper and lower respiratory specimens during the acute phase of infection. The lowest concentration of SARS-CoV-2 viral copies this assay can detect is 250 copies / mL. A negative result does not preclude SARS-CoV-2 infection and should not be used as the sole basis for treatment or other patient management decisions.  A negative result may occur with improper specimen collection / handling, submission of specimen other than nasopharyngeal  swab, presence of viral mutation(s) within the areas targeted by this assay, and inadequate number of viral copies (<250 copies / mL). A negative result must be combined with clinical observations, patient history, and epidemiological information.  Fact Sheet for Patients:   StrictlyIdeas.no  Fact Sheet for Healthcare Providers: BankingDealers.co.za  This test is not yet approved or  cleared by the [place] FDA and has been authorized for detection and/or diagnosis of SARS-CoV-2 by FDA under an Emergency Use Authorization (EUA).  This EUA will remain in effect (meaning this test can be used) for the duration of the COVID-19 declaration under Section 564(b)(1) of the Act, 21 U.S.C. section 360bbb-3(b)(1), unless the authorization is terminated or revoked sooner.  Performed at [facility], [address] [place], [place] [address]   Blood Culture (routine x 2)     Status: None   Collection Time: 03/01/20  8:53 PM   Specimen: BLOOD  Result Value Ref Range Status   Specimen Description BLOOD BLOOD LEFT WRIST  Final   Special Requests   Final    BOTTLES DRAWN AEROBIC AND ANAEROBIC Blood Culture results may not be optimal due to an inadequate volume of blood received in culture bottles   Culture   Final    NO GROWTH 5 DAYS Performed at [facility], [address], [place], [place] [address]    Report Status 03/06/2020 FINAL  Final  Blood Culture (routine x 2)     Status: None   Collection Time: 03/01/20  8:53 PM   Specimen: BLOOD  Result Value Ref Range Status   Specimen Description BLOOD RIGHT ANTECUBITAL  Final   Special Requests   Final    BOTTLES DRAWN AEROBIC AND ANAEROBIC Blood Culture adequate volume   Culture   Final    NO GROWTH 5 DAYS Performed at [facility] [address] [place], [place] [address]    Report Status 03/06/2020 FINAL  Final  C Difficile Quick Screen w PCR reflex      Status: None   Collection Time: 03/03/20  6:13 PM   Specimen: STOOL  Result Value Ref Range Status   C Diff antigen NEGATIVE NEGATIVE Final   C Diff toxin NEGATIVE NEGATIVE Final   C Diff interpretation No C. difficile detected.  Final    Comment: Performed at [facility], [address] [place], [place] [address]  SARS Coronavirus 2 by RT PCR (hospital order, performed in [facility] hospital lab) Nasopharyngeal Nasopharyngeal Swab     Status: None   Collection Time: 03/06/20  9:23 PM   Specimen: Nasopharyngeal Swab  Result Value Ref Range Status  SARS Coronavirus 2 NEGATIVE NEGATIVE Final    Comment: (NOTE) SARS-CoV-2 target nucleic acids are NOT DETECTED.  The SARS-CoV-2 RNA is generally detectable in upper and lower respiratory specimens during the acute phase of infection. The lowest concentration of SARS-CoV-2 viral copies this assay can detect is 250 copies / mL. A negative result does not preclude SARS-CoV-2 infection and should not be used as the sole basis for treatment or other patient management decisions.  A negative result may occur with improper specimen collection / handling, submission of specimen other than nasopharyngeal swab, presence of viral mutation(s) within the areas targeted by this assay, and inadequate number of viral copies (<250 copies / mL). A negative result must be combined with clinical observations, patient history, and epidemiological information.  Fact Sheet for Patients:   StrictlyIdeas.no  Fact Sheet for Healthcare Providers: BankingDealers.co.za  This test is not yet approved or  cleared by the [place] FDA and has been authorized for detection and/or diagnosis of SARS-CoV-2 by FDA under an Emergency Use Authorization (EUA).  This EUA will remain in effect (meaning this test can be used) for the duration of the COVID-19 declaration under Section 564(b)(1) of the Act, 21  U.S.C. section 360bbb-3(b)(1), unless the authorization is terminated or revoked sooner.  Performed at [facility], [address] [place], [place] [address]      Studies: CT ABDOMEN PELVIS WO CONTRAST  Result Date: 03/06/2020 CLINICAL DATA:  86 year old male with abdominal distension. EXAM: CT ABDOMEN AND PELVIS WITHOUT CONTRAST TECHNIQUE: Multidetector CT imaging of the abdomen and pelvis was performed following the standard protocol without IV contrast. COMPARISON:  CT abdomen pelvis dated 03/01/2020. FINDINGS: Evaluation of this exam is limited in the absence of intravenous contrast. Lower chest: Partially visualized small right pleural effusion with associated partial right lung base atelectasis. Pneumonia is not excluded. Clinical correlation is recommended. There is coronary vascular calcification. No intra-abdominal free air. Small perihepatic free fluid. Hepatobiliary: The liver is unremarkable. No intrahepatic biliary ductal dilatation. Mild pericholecystic stranding, likely related to perihepatic fluid. No calcified gallstone. Pancreas: Mild haziness of the fat adjacent to the head and uncinate process of the pancreas may be related to perihepatic ascites. Correlation with pancreatic enzymes recommended to exclude acute pancreatitis. There is a punctate focus of calcification in the uncinate process of the pancreas, similar to prior CT. Spleen: Normal in size without focal abnormality. Adrenals/Urinary Tract: The adrenal glands unremarkable. There is no hydronephrosis on either side. There is a 6 mm vascular calcification versus nonobstructing stone in the upper pole of the right kidney. A 1 cm hypodense lesion in the upper pole of the left kidney is not characterized on this noncontrast CT, possibly a cyst. The visualized ureters and urinary bladder appear unremarkable. Stomach/Bowel: There is sigmoid diverticulosis without active inflammatory changes. There is a 4.5 cm distal  duodenal diverticulum. There is diverticulosis of the distal small bowel loop in the right lower abdomen with associated active inflammation consistent with acute diverticulitis. Overall slight interval worsening of the inflammatory process since the prior CT. No abscess or perforation. There is no bowel obstruction. Loose stool throughout the colon compatible with diarrheal state. Appendectomy. Vascular/Lymphatic: Advanced aortoiliac atherosclerotic disease. The IVC is unremarkable. No portal venous gas. There is no adenopathy. Reproductive: Prostate brachytherapy seeds. Other: Mild diffuse subcutaneous edema. No fluid collection. Musculoskeletal: Osteopenia with degenerative changes of the spine. No acute osseous pathology. IMPRESSION: 1. Acute diverticulitis of the distal small bowel in the right lower abdomen  with slight interval worsening of the inflammatory process since the prior CT. No abscess or perforation. 2. Diarrheal state. No bowel obstruction. 3. Small right pleural effusion with associated partial right lung base atelectasis. 4. Aortic Atherosclerosis (ICD10-I70.0). Electronically Signed   By: Anner Crete M.D.   On: 03/06/2020 20:53   CT Head Wo Contrast  Result Date: 03/06/2020 CLINICAL DATA:  Headache EXAM: CT HEAD WITHOUT CONTRAST TECHNIQUE: Contiguous axial images were obtained from the base of the skull through the vertex without intravenous contrast. COMPARISON:  CT 03/19/2014 FINDINGS: Brain: No acute territorial infarction, hemorrhage, or intracranial mass. Moderate atrophy. Patchy hypodensity in the white matter consistent with chronic small vessel ischemic change. Stable ventricle size. Chronic lacunar infarcts in the basal ganglia. Vascular: No hyperdense vessels.  Carotid vascular calcification Skull: Normal. Negative for fracture or focal lesion. Sinuses/Orbits: Patchy mucosal thickening in the ethmoid sinuses Other: None IMPRESSION: 1. No CT evidence for acute intracranial  abnormality. 2. Atrophy and chronic small vessel ischemic changes of the white matter. Electronically Signed   By: Donavan Foil M.D.   On: 03/06/2020 20:50   DG Chest Portable 1 View  Result Date: 03/06/2020 CLINICAL DATA:  Evaluate pulmonary edema. EXAM: PORTABLE CHEST 1 VIEW COMPARISON:  03/01/2020 FINDINGS: Cardiac enlargement is stable. Small right pleural effusion. Diffuse pulmonary vascular congestion noted. Asymmetric opacity within the right mid lung is noted, new from previous exam. IMPRESSION: 1. New asymmetric opacity within the right mid lung which may represent asymmetric edema versus pneumonia. 2. Cardiac enlargement and pulmonary vascular congestion. Electronically Signed   By: Kerby Moors M.D.   On: 03/06/2020 20:36    Scheduled Meds: . acidophilus  2 capsule Oral TID  . aspirin EC  81 mg Oral Daily  . cholecalciferol  1,000 Units Oral Daily  . escitalopram  10 mg Oral Daily  . heparin  5,000 Units Subcutaneous Q8H  . pantoprazole (PROTONIX) IV  40 mg Intravenous q1800  . traZODone  150 mg Oral QHS   Continuous Infusions: . piperacillin-tazobactam (ZOSYN)  IV 12.5 mL/hr at 03/08/20 1504    Assessment/Plan:  1. Acute diverticulitis with abdominal pain.  Has been on Cipro and Flagyl.  Will change over to Zosyn and see if this makes a difference.  Appreciate GI consultation.  Soft diet. 2. Acute kidney injury on chronic kidney disease stage IIIb.  Continue to monitor closely.  We will give a fluid bolus x1 3. Chronic diastolic congestive heart failure.  Continue to monitor closely.  Fluid bolus today. 4. Essential hypertension.  Holding antihypertensives.  Continue to monitor 5. Depression on Lexapro and trazodone. 6. Weakness.  Physical therapy recommended rehab.    Code Status:     Code Status Orders  (From admission, onward)         Start     Ordered   03/06/20 2233  Do not attempt resuscitation (DNR)  Continuous       Question Answer Comment  In the event  of cardiac or respiratory ARREST Do not call a "code blue"   In the event of cardiac or respiratory ARREST Do not perform Intubation, CPR, defibrillation or ACLS   In the event of cardiac or respiratory ARREST Use medication by any route, position, wound care, and other measures to relive pain and suffering. May use oxygen, suction and manual treatment of airway obstruction as needed for comfort.      03/06/20 2232        Code Status History  Date Active Date Inactive Code Status Order ID Comments User Context   03/06/2020 2232 03/06/2020 2232 Full Code [id number]  Para Skeans, MD ED   03/01/2020 2154 03/04/2020 2008 DNR [id number]  Sidney Ace, Arvella Merles, MD ED   07/17/2018 2112 07/19/2018 1735 DNR [id number]  Demetrios Loll, MD Inpatient   Advance Care Planning Activity    Advance Directive Documentation     Most Recent Value  Type of Advance Directive Healthcare Power of Attorney, Living will, Out of facility DNR (pink MOST or yellow form)  Pre-existing out of facility DNR order (yellow form or pink MOST form) --  "MOST" Form in Place? --     Family Communication: Daughter at bedside Disposition Plan: Status is: Inpatient  Dispo: The patient is from: [facility] independent living              Anticipated d/c is to: [facility]              Anticipated d/c date is: We will need 24 hours to get insurance authorization prior to disposition.  Since he was worsening today and I changed antibiotics will likely need a couple days here in the hospital              Patient currently being treated for acute diverticulitis and readmission for the same issue.  Consultants:  Gastroenterology  Antibiotics:  Zosyn  Time spent: 28 minutes  [place]

## 2020-03-09 ENCOUNTER — Ambulatory Visit: Payer: Self-pay | Admitting: Urology

## 2020-03-09 DIAGNOSIS — R21 Rash and other nonspecific skin eruption: Secondary | ICD-10-CM

## 2020-03-09 LAB — BASIC METABOLIC PANEL
Anion gap: 8 (ref 5–15)
BUN: 35 mg/dL — ABNORMAL HIGH (ref 8–23)
CO2: 24 mmol/L (ref 22–32)
Calcium: 8 mg/dL — ABNORMAL LOW (ref 8.9–10.3)
Chloride: 103 mmol/L (ref 98–111)
Creatinine, Ser: 2.12 mg/dL — ABNORMAL HIGH (ref 0.61–1.24)
GFR calc Af Amer: 30 mL/min — ABNORMAL LOW (ref >60)
GFR calc non Af Amer: 26 mL/min — ABNORMAL LOW (ref >60)
Glucose, Bld: 89 mg/dL (ref 70–99)
Potassium: 4.1 mmol/L (ref 3.5–5.1)
Sodium: 135 mmol/L (ref 135–145)

## 2020-03-09 MED ORDER — SODIUM CHLORIDE 0.9 % IV SOLN
INTRAVENOUS | Status: DC | PRN
  Administered 2020-03-09: 250 mL via INTRAVENOUS

## 2020-03-09 MED ORDER — TRIAMCINOLONE ACETONIDE 0.5 % EX CREA
TOPICAL_CREAM | Freq: Two times a day (BID) | CUTANEOUS | Status: DC
  Filled 2020-03-09: qty 15

## 2020-03-09 NOTE — TOC Progression Note (Signed)
Transition of Care [facility]) - Progression Note    Patient Details  Name: NIKAI QUEST MRN: [id number] Date of Birth: 11/20/1925  Transition of Care [facility]) CM/SW [place], LCSW Phone Number: 03/09/2020, 12:22 PM  Clinical Narrative:  Uploaded clinicals into [facility] portal to start insurance authorization with start date Saturday 7/24 in order to plan for potential weekend discharge. [facility] admissions coordinator confirmed they would be able to take him over the weekend if discharged. Notified her that MD is planning on discharging him on PO abx.  Expected Discharge Plan: [place] Barriers to Discharge: Continued Medical Work up, Ship broker  Expected Discharge Plan and Services Expected Discharge Plan: [place] Choice: [place] Living arrangements for the past 2 months: [place]                                       Social Determinants of Health (SDOH) Interventions    Readmission Risk Interventions Readmission Risk Prevention Plan 07/19/2018  Transportation Screening Complete  PCP or Specialist Appt within 5-7 Days Complete  Home Care Screening Complete  Medication Review (RN CM) Complete  Some recent data might be hidden

## 2020-03-09 NOTE — Care Management Important Message (Signed)
Important Message  Patient Details  Name: ORLEY LAWRY MRN: [id number] Date of Birth: 05-07-1926   Medicare Important Message Given:  Yes     Dannette Barbara 03/09/2020, 1:14 PM

## 2020-03-09 NOTE — Progress Notes (Signed)
Trevor Darby, MD [address]  [place]  [place], [place] [address]  Main: [phone]  Fax: [phone] Pager: [phone]   Subjective: Patient is sitting up in chair, comfortable.  He reports having explosive bowel movement, small volume only.  He does report ongoing lower abdominal discomfort which is intermittent.  His appetite is good, tolerating soft diet well.  He thinks he is not ready to be discharged yet.  His antibiotics have been switched to Zosyn on 7/21   Objective: Vital signs in last 24 hours: Vitals:   03/08/20 2002 03/09/20 0500 03/09/20 0502 03/09/20 1206  BP: (!) 143/60  122/65 (!) 150/71  Pulse: 68  65 60  Resp: 20  20   Temp: 98.1 F (36.7 C)  97.8 F (36.6 C) 97.7 F (36.5 C)  TempSrc: Oral  Oral Oral  SpO2: 96%  97% 95%  Weight:  99.4 kg    Height:       Weight change: 0.4 kg  Intake/Output Summary (Last 24 hours) at 03/09/2020 1514 Last data filed at 03/09/2020 1408 Gross per 24 hour  Intake 479.86 ml  Output 200 ml  Net 279.86 ml     Exam: Heart:: Regular rate and rhythm, S1S2 present or without murmur or extra heart sounds Lungs: normal and clear to auscultation Abdomen: soft, focal right lower abdominal tenderness, normal bowel sounds   Lab Results: CBC Latest Ref Rng & Units 03/07/2020 03/06/2020 03/04/2020  WBC 4.0 - 10.5 K/uL 10.0 9.7 9.9  Hemoglobin 13.0 - 17.0 g/dL 11.4(L) 11.8(L) 10.2(L)  Hematocrit 39 - 52 % 32.4(L) 34.8(L) 29.1(L)  Platelets 150 - 400 K/uL 131(L) 140(L) 99(L)   BMP Latest Ref Rng & Units 03/09/2020 03/07/2020 03/06/2020  Glucose 70 - 99 mg/dL 89 113(H) 154(H)  BUN 8 - 23 mg/dL 35(H) 35(H) 38(H)  Creatinine 0.61 - 1.24 mg/dL 2.12(H) 2.20(H) 2.37(H)  Sodium 135 - 145 mmol/L 135 138 134(L)  Potassium 3.5 - 5.1 mmol/L 4.1 4.1 4.2  Chloride 98 - 111 mmol/L 103 107 103  CO2 22 - 32 mmol/L 24 24 23   Calcium 8.9 - 10.3 mg/dL 8.0(L) 8.2(L) 8.4(L)    Micro Results: Recent Results (from the past 240  hour(s))  SARS Coronavirus 2 by RT PCR (hospital order, performed in [facility] hospital lab) Nasopharyngeal Nasopharyngeal Swab     Status: None   Collection Time: 03/01/20  8:53 PM   Specimen: Nasopharyngeal Swab  Result Value Ref Range Status   SARS Coronavirus 2 NEGATIVE NEGATIVE Final    Comment: (NOTE) SARS-CoV-2 target nucleic acids are NOT DETECTED.  The SARS-CoV-2 RNA is generally detectable in upper and lower respiratory specimens during the acute phase of infection. The lowest concentration of SARS-CoV-2 viral copies this assay can detect is 250 copies / mL. A negative result does not preclude SARS-CoV-2 infection and should not be used as the sole basis for treatment or other patient management decisions.  A negative result may occur with improper specimen collection / handling, submission of specimen other than nasopharyngeal swab, presence of viral mutation(s) within the areas targeted by this assay, and inadequate number of viral copies (<250 copies / mL). A negative result must be combined with clinical observations, patient history, and epidemiological information.  Fact Sheet for Patients:   StrictlyIdeas.no  Fact Sheet for Healthcare Providers: BankingDealers.co.za  This test is not yet approved or  cleared by the [place] FDA and has been authorized for detection and/or diagnosis of SARS-CoV-2 by  FDA under an Emergency Use Authorization (EUA).  This EUA will remain in effect (meaning this test can be used) for the duration of the COVID-19 declaration under Section 564(b)(1) of the Act, 21 U.S.C. section 360bbb-3(b)(1), unless the authorization is terminated or revoked sooner.  Performed at [facility], [address] [place], [place] [address]   Blood Culture (routine x 2)     Status: None   Collection Time: 03/01/20  8:53 PM   Specimen: BLOOD  Result Value Ref Range Status   Specimen  Description BLOOD BLOOD LEFT WRIST  Final   Special Requests   Final    BOTTLES DRAWN AEROBIC AND ANAEROBIC Blood Culture results may not be optimal due to an inadequate volume of blood received in culture bottles   Culture   Final    NO GROWTH 5 DAYS Performed at [facility], [address] [place], [place] [address]    Report Status 03/06/2020 FINAL  Final  Blood Culture (routine x 2)     Status: None   Collection Time: 03/01/20  8:53 PM   Specimen: BLOOD  Result Value Ref Range Status   Specimen Description BLOOD RIGHT ANTECUBITAL  Final   Special Requests   Final    BOTTLES DRAWN AEROBIC AND ANAEROBIC Blood Culture adequate volume   Culture   Final    NO GROWTH 5 DAYS Performed at [facility], [address] [place], [place] [address]    Report Status 03/06/2020 FINAL  Final  C Difficile Quick Screen w PCR reflex     Status: None   Collection Time: 03/03/20  6:13 PM   Specimen: STOOL  Result Value Ref Range Status   C Diff antigen NEGATIVE NEGATIVE Final   C Diff toxin NEGATIVE NEGATIVE Final   C Diff interpretation No C. difficile detected.  Final    Comment: Performed at [facility], [address] [place], [place] [address]  SARS Coronavirus 2 by RT PCR (hospital order, performed in [facility] hospital lab) Nasopharyngeal Nasopharyngeal Swab     Status: None   Collection Time: 03/06/20  9:23 PM   Specimen: Nasopharyngeal Swab  Result Value Ref Range Status   SARS Coronavirus 2 NEGATIVE NEGATIVE Final    Comment: (NOTE) SARS-CoV-2 target nucleic acids are NOT DETECTED.  The SARS-CoV-2 RNA is generally detectable in upper and lower respiratory specimens during the acute phase of infection. The lowest concentration of SARS-CoV-2 viral copies this assay can detect is 250 copies / mL. A negative result does not preclude SARS-CoV-2 infection and should not be used as the sole basis for treatment or other patient management decisions.  A  negative result may occur with improper specimen collection / handling, submission of specimen other than nasopharyngeal swab, presence of viral mutation(s) within the areas targeted by this assay, and inadequate number of viral copies (<250 copies / mL). A negative result must be combined with clinical observations, patient history, and epidemiological information.  Fact Sheet for Patients:   StrictlyIdeas.no  Fact Sheet for Healthcare Providers: BankingDealers.co.za  This test is not yet approved or  cleared by the [place] FDA and has been authorized for detection and/or diagnosis of SARS-CoV-2 by FDA under an Emergency Use Authorization (EUA).  This EUA will remain in effect (meaning this test can be used) for the duration of the COVID-19 declaration under Section 564(b)(1) of the Act, 21 U.S.C. section 360bbb-3(b)(1), unless the authorization is terminated or revoked sooner.  Performed at  [facility], [address] [place], [place] [address]    Studies/Results: No results found. Medications:  I have reviewed the patient's current medications. Prior to Admission:  Medications Prior to Admission  Medication Sig Dispense Refill Last Dose  . aspirin EC 81 MG tablet Take 1 tablet (81 mg total) by mouth daily. (Patient taking differently: Take 81 mg by mouth 2 (two) times daily. ) 90 tablet 3 03/06/2020 at 0930  . Cholecalciferol (VITAMIN D-3) 25 MCG (1000 UT) CAPS Take 1,000 Units by mouth daily.    03/06/2020 at 0930  . escitalopram (LEXAPRO) 10 MG tablet Take 10 mg by mouth daily.    03/06/2020 at 0930  . furosemide (LASIX) 20 MG tablet Take 1 tablet (20 mg total) by mouth daily as needed. Use sparingly. Take potassium when you take furosemide. 30 tablet 6 PRN at PRN  . losartan-hydrochlorothiazide (HYZAAR) 100-25 MG per tablet Take 1 tablet by mouth daily.   03/06/2020 at 0930  . potassium chloride SA (K-DUR,KLOR-CON)  20 MEQ tablet Take 1 tablet (20 mEq total) by mouth daily as needed. Take when you take the furosemide. 30 tablet 6 PRN at PRN  . traZODone (DESYREL) 150 MG tablet Take 150 mg by mouth at bedtime.   03/05/2020 at Unknown time  . triamcinolone (KENALOG) 0.025 % cream Apply topically 2 (two) times daily.   03/06/2020 at 0930  . dextromethorphan-guaiFENesin (MUCINEX DM) 30-600 MG 12hr tablet Take 1 tablet by mouth 2 (two) times daily. (Patient not taking: Reported on 03/06/2020) 30 tablet 0 Not Taking at Unknown time  . metroNIDAZOLE (FLAGYL) 500 MG tablet Take 1 tablet (500 mg total) by mouth 3 (three) times daily for 5 days. (Patient not taking: Reported on 03/06/2020) 15 tablet 0 Not Taking at Unknown time  . ondansetron (ZOFRAN) 4 MG tablet Take 1 tablet (4 mg total) by mouth every 6 (six) hours as needed for nausea. (Patient not taking: Reported on 03/06/2020) 20 tablet 0 Not Taking at Unknown time  . sulfamethoxazole-trimethoprim (BACTRIM DS) 800-160 MG tablet Take 1 tablet by mouth 2 (two) times daily for 5 days. (Patient not taking: Reported on 03/06/2020) 10 tablet 0 Not Taking at Unknown time   Scheduled: . acidophilus  2 capsule Oral TID  . aspirin EC  81 mg Oral Daily  . cholecalciferol  1,000 Units Oral Daily  . escitalopram  10 mg Oral Daily  . heparin  5,000 Units Subcutaneous Q8H  . pantoprazole (PROTONIX) IV  40 mg Intravenous q1800  . traZODone  150 mg Oral QHS  . triamcinolone cream   Topical BID   Continuous: . sodium chloride 250 mL (03/09/20 1415)  . piperacillin-tazobactam (ZOSYN)  IV 3.375 g (03/09/20 1416)   XBD:ZHGDJM chloride, acetaminophen **OR** acetaminophen, hydrALAZINE, ipratropium-albuterol Anti-infectives (From admission, onward)   Start     Dose/Rate Route Frequency Ordered Stop   03/08/20 1200  piperacillin-tazobactam (ZOSYN) IVPB 3.375 g     Discontinue     3.375 g 12.5 mL/hr over 240 Minutes Intravenous Every 8 hours 03/08/20 1036     03/07/20 1200   ciprofloxacin (CIPRO) IVPB 400 mg  Status:  Discontinued        400 mg 200 mL/hr over 60 Minutes Intravenous Every 24 hours 03/07/20 1024 03/08/20 1035   03/07/20 0700  aztreonam (AZACTAM) 1 g in sodium chloride 0.9 % 100 mL IVPB  Status:  Discontinued        1 g 200 mL/hr over 30 Minutes Intravenous Every  8 hours 03/06/20 2147 03/07/20 1024   03/06/20 2200  aztreonam (AZACTAM) 1 g in sodium chloride 0.9 % 100 mL IVPB  Status:  Discontinued        1 g 200 mL/hr over 30 Minutes Intravenous Every 8 hours 03/06/20 2123 03/06/20 2147   03/06/20 2200  metroNIDAZOLE (FLAGYL) IVPB 500 mg  Status:  Discontinued        500 mg 100 mL/hr over 60 Minutes Intravenous Every 8 hours 03/06/20 2147 03/08/20 1035   03/06/20 2130  metroNIDAZOLE (FLAGYL) IVPB 500 mg        500 mg 100 mL/hr over 60 Minutes Intravenous  Once 03/06/20 2123 03/06/20 2237     Scheduled Meds: . acidophilus  2 capsule Oral TID  . aspirin EC  81 mg Oral Daily  . cholecalciferol  1,000 Units Oral Daily  . escitalopram  10 mg Oral Daily  . heparin  5,000 Units Subcutaneous Q8H  . pantoprazole (PROTONIX) IV  40 mg Intravenous q1800  . traZODone  150 mg Oral QHS  . triamcinolone cream   Topical BID   Continuous Infusions: . sodium chloride 250 mL (03/09/20 1415)  . piperacillin-tazobactam (ZOSYN)  IV 3.375 g (03/09/20 1416)   PRN Meds:.sodium chloride, acetaminophen **OR** acetaminophen, hydrALAZINE, ipratropium-albuterol   Assessment: Active Problems:   COPD (chronic obstructive pulmonary disease) (HCC)   Chronic diastolic CHF (congestive heart failure) (HCC)   Essential hypertension   Acute diverticulitis   Abdominal pain   Acute kidney injury superimposed on CKD ([place])   Anemia   Depression   Weakness   Rash  Trevor Ferguson is a 86 y.o. male with history of E. coli infection s/p treatment with 5 days course of ciprofloxacin admitted with acute uncomplicated distal small bowel diverticulitis.  Plan: Acute small  bowel diverticulitis: Cross-sectional imaging with no evidence of perforation or abscess, slowly improving Latest CT revealed worsening of inflammation Switched to Zosyn on 7/21 due to ongoing abdominal pain.  Recommend Cipro and Flagyl at the time of discharge No indication for surgery at this time Serial abdominal exams and consult surgery if patient develops acute abdomen Recommend restricted diet, lactose-free, avoid red meat, processed foods, fatty/greasy foods, carbonated beverages    LOS: 3 days   Trannie Bardales 03/09/2020, 3:14 PM

## 2020-03-09 NOTE — Progress Notes (Signed)
Patient ID: STEFFON GLADU, male   DOB: 27-Aug-1925, 86 y.o.   MRN: [id number] [facility] PROGRESS NOTE  GUS LITTLER IHK:[id number] DOB: Mar 17, 1926 DOA: 03/06/2020 PCP: Juluis Pitch, MD  HPI/Subjective: Patient states that he is not feeling much better.  Still having diarrhea that he cannot control.  Still having some abdominal discomfort.  Has a little shortness of breath but not worse than yesterday.  Some itching on his back.  Objective: Vitals:   03/09/20 0502 03/09/20 1206  BP: 122/65 (!) 150/71  Pulse: 65 60  Resp: 20   Temp: 97.8 F (36.6 C)   SpO2: 97% 95%    Intake/Output Summary (Last 24 hours) at 03/09/2020 1432 Last data filed at 03/09/2020 1408 Gross per 24 hour  Intake 509.94 ml  Output --  Net 509.94 ml   Filed Weights   03/07/20 0500 03/08/20 0500 03/09/20 0500  Weight: 98.2 kg 99 kg 99.4 kg    ROS: Review of Systems  Respiratory: Positive for shortness of breath.   Cardiovascular: Negative for chest pain.  Gastrointestinal: Positive for abdominal pain and diarrhea.  Skin: Positive for itching.   Exam: Physical Exam HENT:     Head: Normocephalic.     Nose: No mucosal edema.     Mouth/Throat:     Pharynx: No oropharyngeal exudate.  Eyes:     General: Lids are normal.     Pupils: Pupils are equal, round, and reactive to light.  Cardiovascular:     Rate and Rhythm: Normal rate and regular rhythm.     Heart sounds: Normal heart sounds, S1 normal and S2 normal.  Pulmonary:     Breath sounds: Examination of the right-lower field reveals decreased breath sounds. Examination of the left-lower field reveals decreased breath sounds. Decreased breath sounds present. No wheezing, rhonchi or rales.  Abdominal:     Palpations: Abdomen is soft.     Tenderness: There is abdominal tenderness in the left lower quadrant.  Musculoskeletal:     Right ankle: Swelling present.     Left ankle: Swelling present.  Skin:    General: Skin is warm.      Comments: Back does have some erythematous macules.  Neurological:     Mental Status: He is alert and oriented to person, place, and time.       Data Reviewed: Basic Metabolic Panel: Recent Labs  Lab 03/03/20 0420 03/04/20 0338 03/06/20 1628 03/07/20 0112 03/09/20 0808  NA 133* 134* 134* 138 135  K 4.4 4.2 4.2 4.1 4.1  CL 103 104 103 107 103  CO2 20* 24 23 24 24   GLUCOSE 107* 99 154* 113* 89  BUN 38* 35* 38* 35* 35*  CREATININE 2.34* 2.05* 2.37* 2.20* 2.12*  CALCIUM 7.6* 7.8* 8.4* 8.2* 8.0*   Liver Function Tests: Recent Labs  Lab 03/06/20 1628 03/07/20 0112  AST 17 16  ALT 12 12  ALKPHOS 59 52  BILITOT 0.6 0.8  PROT 6.8 6.3*  ALBUMIN 3.7 3.7   Recent Labs  Lab 03/06/20 1628  LIPASE 31   CBC: Recent Labs  Lab 03/03/20 0420 03/04/20 0338 03/06/20 1628 03/07/20 0112  WBC 13.6* 9.9 9.7 10.0  HGB 10.0* 10.2* 11.8* 11.4*  HCT 27.9* 29.1* 34.8* 32.4*  MCV 91.2 92.7 93.8 92.0  PLT 106* 99* 140* 131*   BNP (last 3 results) Recent Labs    03/01/20 1907 03/06/20 1628  BNP 331.9* 445.3*     Recent Results (from the past 240 hour(s))  SARS Coronavirus 2 by RT PCR (hospital order, performed in [facility] hospital lab) Nasopharyngeal Nasopharyngeal Swab     Status: None   Collection Time: 03/01/20  8:53 PM   Specimen: Nasopharyngeal Swab  Result Value Ref Range Status   SARS Coronavirus 2 NEGATIVE NEGATIVE Final    Comment: (NOTE) SARS-CoV-2 target nucleic acids are NOT DETECTED.  The SARS-CoV-2 RNA is generally detectable in upper and lower respiratory specimens during the acute phase of infection. The lowest concentration of SARS-CoV-2 viral copies this assay can detect is 250 copies / mL. A negative result does not preclude SARS-CoV-2 infection and should not be used as the sole basis for treatment or other patient management decisions.  A negative result may occur with improper specimen collection / handling, submission of specimen other than  nasopharyngeal swab, presence of viral mutation(s) within the areas targeted by this assay, and inadequate number of viral copies (<250 copies / mL). A negative result must be combined with clinical observations, patient history, and epidemiological information.  Fact Sheet for Patients:   StrictlyIdeas.no  Fact Sheet for Healthcare Providers: BankingDealers.co.za  This test is not yet approved or  cleared by the [place] FDA and has been authorized for detection and/or diagnosis of SARS-CoV-2 by FDA under an Emergency Use Authorization (EUA).  This EUA will remain in effect (meaning this test can be used) for the duration of the COVID-19 declaration under Section 564(b)(1) of the Act, 21 U.S.C. section 360bbb-3(b)(1), unless the authorization is terminated or revoked sooner.  Performed at [facility], [address] [place], [place] [address]   Blood Culture (routine x 2)     Status: None   Collection Time: 03/01/20  8:53 PM   Specimen: BLOOD  Result Value Ref Range Status   Specimen Description BLOOD BLOOD LEFT WRIST  Final   Special Requests   Final    BOTTLES DRAWN AEROBIC AND ANAEROBIC Blood Culture results may not be optimal due to an inadequate volume of blood received in culture bottles   Culture   Final    NO GROWTH 5 DAYS Performed at [facility], [address], [place], [place] [address]    Report Status 03/06/2020 FINAL  Final  Blood Culture (routine x 2)     Status: None   Collection Time: 03/01/20  8:53 PM   Specimen: BLOOD  Result Value Ref Range Status   Specimen Description BLOOD RIGHT ANTECUBITAL  Final   Special Requests   Final    BOTTLES DRAWN AEROBIC AND ANAEROBIC Blood Culture adequate volume   Culture   Final    NO GROWTH 5 DAYS Performed at [facility], [address], [place], [place] [address]    Report Status 03/06/2020 FINAL  Final  C Difficile Quick Screen  w PCR reflex     Status: None   Collection Time: 03/03/20  6:13 PM   Specimen: STOOL  Result Value Ref Range Status   C Diff antigen NEGATIVE NEGATIVE Final   C Diff toxin NEGATIVE NEGATIVE Final   C Diff interpretation No C. difficile detected.  Final    Comment: Performed at [facility], [address] [place], [place] [address]  SARS Coronavirus 2 by RT PCR (hospital order, performed in [facility] hospital lab) Nasopharyngeal Nasopharyngeal Swab     Status: None   Collection Time: 03/06/20  9:23 PM   Specimen: Nasopharyngeal Swab  Result Value Ref Range Status   SARS Coronavirus 2 NEGATIVE  NEGATIVE Final    Comment: (NOTE) SARS-CoV-2 target nucleic acids are NOT DETECTED.  The SARS-CoV-2 RNA is generally detectable in upper and lower respiratory specimens during the acute phase of infection. The lowest concentration of SARS-CoV-2 viral copies this assay can detect is 250 copies / mL. A negative result does not preclude SARS-CoV-2 infection and should not be used as the sole basis for treatment or other patient management decisions.  A negative result may occur with improper specimen collection / handling, submission of specimen other than nasopharyngeal swab, presence of viral mutation(s) within the areas targeted by this assay, and inadequate number of viral copies (<250 copies / mL). A negative result must be combined with clinical observations, patient history, and epidemiological information.  Fact Sheet for Patients:   StrictlyIdeas.no  Fact Sheet for Healthcare Providers: BankingDealers.co.za  This test is not yet approved or  cleared by the [place] FDA and has been authorized for detection and/or diagnosis of SARS-CoV-2 by FDA under an Emergency Use Authorization (EUA).  This EUA will remain in effect (meaning this test can be used) for the duration of the COVID-19 declaration under Section 564(b)(1) of  the Act, 21 U.S.C. section 360bbb-3(b)(1), unless the authorization is terminated or revoked sooner.  Performed at [facility], [address] [place], [place] [address]       Scheduled Meds: . acidophilus  2 capsule Oral TID  . aspirin EC  81 mg Oral Daily  . cholecalciferol  1,000 Units Oral Daily  . escitalopram  10 mg Oral Daily  . heparin  5,000 Units Subcutaneous Q8H  . pantoprazole (PROTONIX) IV  40 mg Intravenous q1800  . traZODone  150 mg Oral QHS  . triamcinolone cream   Topical BID   Continuous Infusions: . sodium chloride 250 mL (03/09/20 1415)  . piperacillin-tazobactam (ZOSYN)  IV 3.375 g (03/09/20 1416)    Assessment/Plan:  1. Acute diverticulitis with abdominal pain.  The patient failed outpatient therapy and needed to come back to the hospital.  I switched antibiotics over to Zosyn yesterday.  Patient does have some itching on his back with a little redness.  Continue Zosyn at this time.  Abdominal pain and tenderness seems a little bit less to me but patient states that he still in a lot of pain. Diet advanced to soft diet. 2. Upper back rash with some itching.  Give triamcinolone cream.  Continue Zosyn for now. 3. Acute kidney injury on chronic kidney disease stage IIIb acute Today creatinine 2.12 today with a GFR of 26. 4. Chronic diastolic congestive heart failure.  No signs of fluid overload currently.  Continue to monitor.  5. Essential hypertension.  Holding antihypertensive medications.  Continue to watch closely. 6. Depression on Lexapro and trazodone. 7. Weakness.  Physical therapy recommended rehab.    Code Status:     Code Status Orders  (From admission, onward)         Start     Ordered   03/06/20 2233  Do not attempt resuscitation (DNR)  Continuous       Question Answer Comment  In the event of cardiac or respiratory ARREST Do not call a "code blue"   In the event of cardiac or respiratory ARREST Do not perform Intubation,  CPR, defibrillation or ACLS   In the event of cardiac or respiratory ARREST Use medication by any route, position, wound care, and other measures to relive pain and suffering. May use oxygen, suction and manual treatment of  airway obstruction as needed for comfort.      03/06/20 2232        Code Status History    Date Active Date Inactive Code Status Order ID Comments User Context   03/06/2020 2232 03/06/2020 2232 Full Code [id number]  Para Skeans, MD ED   03/01/2020 2154 03/04/2020 2008 DNR [id number]  Sidney Ace, Arvella Merles, MD ED   07/17/2018 2112 07/19/2018 1735 DNR [id number]  Demetrios Loll, MD Inpatient   Advance Care Planning Activity    Advance Directive Documentation     Most Recent Value  Type of Advance Directive Healthcare Power of Attorney, Living will, Out of facility DNR (pink MOST or yellow form)  Pre-existing out of facility DNR order (yellow form or pink MOST form) --  "MOST" Form in Place? --     Family Communication: Left message for daughter on the phone Disposition Plan: Status is: Inpatient  Dispo: The patient is from: [facility] independent living              Anticipated d/c is to: [facility]              Anticipated d/c date is: We will need 24 hours to get insurance authorization prior to disposition.  Patient slightly better today.  Potential discharge over the weekend to rehab.              Patient currently being treated for acute diverticulitis and readmission for the same issue.  Antibiotic switched over to Zosyn yesterday.  Would like to give another day or so of IV Zosyn and see how things improve.  Consultants:  Gastroenterology  Antibiotics:  Zosyn  Time spent: 27 minutes  [place]

## 2020-03-10 ENCOUNTER — Inpatient Hospital Stay: Payer: Medicare PPO

## 2020-03-10 MED ORDER — IOHEXOL 9 MG/ML PO SOLN
500.0000 mL | ORAL | Status: AC
  Administered 2020-03-10 (×2): 500 mL via ORAL

## 2020-03-10 MED ORDER — HYDROCORTISONE ACETATE 25 MG RE SUPP
25.0000 mg | Freq: Two times a day (BID) | RECTAL | Status: DC
  Filled 2020-03-10 (×7): qty 1

## 2020-03-10 MED ORDER — LOPERAMIDE HCL 2 MG PO CAPS: 2.0000 mg | ORAL_CAPSULE | Freq: Three times a day (TID) | ORAL | Status: DC | PRN

## 2020-03-10 MED ORDER — LOPERAMIDE HCL 2 MG PO CAPS
2.0000 mg | ORAL_CAPSULE | Freq: Once | ORAL | Status: AC
  Administered 2020-03-10: 2 mg via ORAL
  Filled 2020-03-10: qty 1

## 2020-03-10 MED ORDER — AMLODIPINE BESYLATE 5 MG PO TABS: 2.5000 mg | ORAL_TABLET | Freq: Every day | ORAL | Status: DC

## 2020-03-10 MED ORDER — LOSARTAN POTASSIUM 25 MG PO TABS
25.0000 mg | ORAL_TABLET | Freq: Every day | ORAL | Status: DC
  Administered 2020-03-10 – 2020-03-13 (×4): 25 mg via ORAL
  Filled 2020-03-10 (×4): qty 1

## 2020-03-10 NOTE — Progress Notes (Signed)
Patient ID: Trevor Ferguson, male   DOB: Jan 11, 1926, 86 y.o.   MRN: [id number] [facility] PROGRESS NOTE  ONEY FOLZ NWG:[id number] DOB: [birth year] DOA: 03/06/2020 PCP: Juluis Pitch, MD  HPI/Subjective: Patient seen this morning and not feeling any better.  Still having abdominal pain.  Still having loose bowel movements throughout the night.  Was readmitted with diverticulitis.  Objective: Vitals:   03/10/20 0555 03/10/20 0713  BP: (!) 165/71 (!) 148/66  Pulse: 70 77  Resp: 20   Temp: 97.7 F (36.5 C)   SpO2: 98%     Intake/Output Summary (Last 24 hours) at 03/10/2020 1624 Last data filed at 03/10/2020 1505 Gross per 24 hour  Intake 621.81 ml  Output 1100 ml  Net -478.19 ml   Filed Weights   03/08/20 0500 03/09/20 0500 03/10/20 0500  Weight: 99 kg 99.4 kg (!) 97.3 kg    ROS: Review of Systems  Respiratory: Positive for shortness of breath.   Cardiovascular: Negative for chest pain.  Gastrointestinal: Positive for abdominal pain and diarrhea. Negative for nausea and vomiting.   Exam: Physical Exam HENT:     Nose: No mucosal edema.     Mouth/Throat:     Pharynx: No oropharyngeal exudate.  Eyes:     General: Lids are normal.     Conjunctiva/sclera: Conjunctivae normal.     Pupils: Pupils are equal, round, and reactive to light.  Cardiovascular:     Rate and Rhythm: Normal rate and regular rhythm.     Heart sounds: S1 normal and S2 normal.  Pulmonary:     Breath sounds: Examination of the right-lower field reveals decreased breath sounds. Examination of the left-lower field reveals decreased breath sounds. Decreased breath sounds present. No wheezing, rhonchi or rales.  Abdominal:     Palpations: Abdomen is soft.     Tenderness: There is abdominal tenderness in the right lower quadrant and suprapubic area.  Musculoskeletal:     Right ankle: Swelling present.     Left ankle: Swelling present.  Skin:    General: Skin is warm.     Findings: No rash.   Neurological:     Mental Status: He is alert and oriented to person, place, and time.       Data Reviewed: Basic Metabolic Panel: Recent Labs  Lab 03/04/20 0338 03/06/20 1628 03/07/20 0112 03/09/20 0808  NA 134* 134* 138 135  K 4.2 4.2 4.1 4.1  CL 104 103 107 103  CO2 24 23 24 24   GLUCOSE 99 154* 113* 89  BUN 35* 38* 35* 35*  CREATININE 2.05* 2.37* 2.20* 2.12*  CALCIUM 7.8* 8.4* 8.2* 8.0*   Liver Function Tests: Recent Labs  Lab 03/06/20 1628 03/07/20 0112  AST 17 16  ALT 12 12  ALKPHOS 59 52  BILITOT 0.6 0.8  PROT 6.8 6.3*  ALBUMIN 3.7 3.7   Recent Labs  Lab 03/06/20 1628  LIPASE 31   CBC: Recent Labs  Lab 03/04/20 0338 03/06/20 1628 03/07/20 0112  WBC 9.9 9.7 10.0  HGB 10.2* 11.8* 11.4*  HCT 29.1* 34.8* 32.4*  MCV 92.7 93.8 92.0  PLT 99* 140* 131*   BNP (last 3 results) Recent Labs    03/01/20 1907 03/06/20 1628  BNP 331.9* 445.3*     Recent Results (from the past 240 hour(s))  SARS Coronavirus 2 by RT PCR (hospital order, performed in [facility] hospital lab) Nasopharyngeal Nasopharyngeal Swab     Status: None   Collection Time: 03/01/20  8:53  PM   Specimen: Nasopharyngeal Swab  Result Value Ref Range Status   SARS Coronavirus 2 NEGATIVE NEGATIVE Final    Comment: (NOTE) SARS-CoV-2 target nucleic acids are NOT DETECTED.  The SARS-CoV-2 RNA is generally detectable in upper and lower respiratory specimens during the acute phase of infection. The lowest concentration of SARS-CoV-2 viral copies this assay can detect is 250 copies / mL. A negative result does not preclude SARS-CoV-2 infection and should not be used as the sole basis for treatment or other patient management decisions.  A negative result may occur with improper specimen collection / handling, submission of specimen other than nasopharyngeal swab, presence of viral mutation(s) within the areas targeted by this assay, and inadequate number of viral copies (<250 copies /  mL). A negative result must be combined with clinical observations, patient history, and epidemiological information.  Fact Sheet for Patients:   StrictlyIdeas.no  Fact Sheet for Healthcare Providers: BankingDealers.co.za  This test is not yet approved or  cleared by the [place] FDA and has been authorized for detection and/or diagnosis of SARS-CoV-2 by FDA under an Emergency Use Authorization (EUA).  This EUA will remain in effect (meaning this test can be used) for the duration of the COVID-19 declaration under Section 564(b)(1) of the Act, 21 U.S.C. section 360bbb-3(b)(1), unless the authorization is terminated or revoked sooner.  Performed at [facility], [address] [place], [place] [address]   Blood Culture (routine x 2)     Status: None   Collection Time: 03/01/20  8:53 PM   Specimen: BLOOD  Result Value Ref Range Status   Specimen Description BLOOD BLOOD LEFT WRIST  Final   Special Requests   Final    BOTTLES DRAWN AEROBIC AND ANAEROBIC Blood Culture results may not be optimal due to an inadequate volume of blood received in culture bottles   Culture   Final    NO GROWTH 5 DAYS Performed at [facility], [address] [place], [place] [address]    Report Status 03/06/2020 FINAL  Final  Blood Culture (routine x 2)     Status: None   Collection Time: 03/01/20  8:53 PM   Specimen: BLOOD  Result Value Ref Range Status   Specimen Description BLOOD RIGHT ANTECUBITAL  Final   Special Requests   Final    BOTTLES DRAWN AEROBIC AND ANAEROBIC Blood Culture adequate volume   Culture   Final    NO GROWTH 5 DAYS Performed at [facility], [address], [place], [place] [address]    Report Status 03/06/2020 FINAL  Final  C Difficile Quick Screen w PCR reflex     Status: None   Collection Time: 03/03/20  6:13 PM   Specimen: STOOL  Result Value Ref Range Status   C Diff antigen NEGATIVE  NEGATIVE Final   C Diff toxin NEGATIVE NEGATIVE Final   C Diff interpretation No C. difficile detected.  Final    Comment: Performed at [facility], [address] [place], [place] [address]  SARS Coronavirus 2 by RT PCR (hospital order, performed in [facility] hospital lab) Nasopharyngeal Nasopharyngeal Swab     Status: None   Collection Time: 03/06/20  9:23 PM   Specimen: Nasopharyngeal Swab  Result Value Ref Range Status   SARS Coronavirus 2 NEGATIVE NEGATIVE Final    Comment: (NOTE) SARS-CoV-2 target nucleic acids are NOT DETECTED.  The SARS-CoV-2 RNA is generally detectable in upper and lower respiratory specimens during the acute  phase of infection. The lowest concentration of SARS-CoV-2 viral copies this assay can detect is 250 copies / mL. A negative result does not preclude SARS-CoV-2 infection and should not be used as the sole basis for treatment or other patient management decisions.  A negative result may occur with improper specimen collection / handling, submission of specimen other than nasopharyngeal swab, presence of viral mutation(s) within the areas targeted by this assay, and inadequate number of viral copies (<250 copies / mL). A negative result must be combined with clinical observations, patient history, and epidemiological information.  Fact Sheet for Patients:   StrictlyIdeas.no  Fact Sheet for Healthcare Providers: BankingDealers.co.za  This test is not yet approved or  cleared by the [place] FDA and has been authorized for detection and/or diagnosis of SARS-CoV-2 by FDA under an Emergency Use Authorization (EUA).  This EUA will remain in effect (meaning this test can be used) for the duration of the COVID-19 declaration under Section 564(b)(1) of the Act, 21 U.S.C. section 360bbb-3(b)(1), unless the authorization is terminated or revoked sooner.  Performed at [facility], [address], [place], [place] [address]      Studies: No results found.  Scheduled Meds: . acidophilus  2 capsule Oral TID  . aspirin EC  81 mg Oral Daily  . cholecalciferol  1,000 Units Oral Daily  . escitalopram  10 mg Oral Daily  . heparin  5,000 Units Subcutaneous Q8H  . hydrocortisone  25 mg Rectal BID  . traZODone  150 mg Oral QHS  . triamcinolone cream   Topical BID   Continuous Infusions: . sodium chloride Stopped (03/09/20 2154)  . piperacillin-tazobactam (ZOSYN)  IV 3.375 g (03/10/20 1516)    Assessment/Plan:  1. Acute diverticulitis with abdominal pain.  The patient failed outpatient therapy and needed to be readmitted.  I switch the patient over to Zosyn on 03/08/2020.  Repeat CT scan today since the patient does not seem to be progressing.  Can go back on soft diet after CT scan. 2. Acute kidney injury on chronic kidney disease stage IIIb.  Recheck labs tomorrow. 3. Chronic diastolic congestive heart failure.  No signs of fluid overload currently.  Continue to monitor closely 4. Essential hypertension.  Currently holding antihypertensive medications.  Since blood pressure starting to creep up, I will give low-dose losartan. 5. Depression on Lexapro and trazodone 6. Weakness.  Physical therapy recommends rehab 7. COPD.  Respiratory status stable.    Code Status:     Code Status Orders  (From admission, onward)         Start     Ordered   03/06/20 2233  Do not attempt resuscitation (DNR)  Continuous       Question Answer Comment  In the event of cardiac or respiratory ARREST Do not call a "code blue"   In the event of cardiac or respiratory ARREST Do not perform Intubation, CPR, defibrillation or ACLS   In the event of cardiac or respiratory ARREST Use medication by any route, position, wound care, and other measures to relive pain and suffering. May use oxygen, suction and manual treatment of airway obstruction as needed for comfort.      03/06/20 2232         Code Status History    Date Active Date Inactive Code Status Order ID Comments User Context   03/06/2020 2232 03/06/2020 2232 Full Code [id number]  Para Skeans, MD ED   03/01/2020 2154 03/04/2020 2008 DNR [id number]  Sidney Ace Arvella Merles, MD ED   07/17/2018 2112 07/19/2018 1735 DNR [id number]  Demetrios Loll, MD Inpatient   Advance Care Planning Activity    Advance Directive Documentation     Most Recent Value  Type of Advance Directive Healthcare Power of [place], Living will, Out of facility DNR (pink MOST or yellow form)  Pre-existing out of facility DNR order (yellow form or pink MOST form) --  "MOST" Form in Place? --     Family Communication: Spoke with daughter yesterday Disposition Plan: Status is: Inpatient  Dispo: The patient is from: Home              Anticipated d/c is to: Rehab              Anticipated d/c date is: Likely will need a few more days here since very slow to respond.              Patient currently being treated for acute diverticulitis.  I'm getting another CT scan of the abdomen today  Consultants:  Gastroenterology  Antibiotics: -Zosyn  Time spent: 28 minutes  [place]

## 2020-03-10 NOTE — Progress Notes (Signed)
Patient ID: Trevor Ferguson, male   DOB: 06-01-1926, 86 y.o.   MRN: [id number]  Spoke with daughter on the phone and gave update.  CT scan still showing diverticulitis of the small bowel.  Continue current management with IV antibiotics at this time.  Reassess tomorrow.  Dr Loletha Grayer

## 2020-03-10 NOTE — Plan of Care (Signed)
Continuing with plan of care. 

## 2020-03-10 NOTE — Progress Notes (Signed)
Cephas Darby, MD [address]  [place]  [place], [place] [address]  Main: [phone]  Fax: [phone] Pager: [phone]   Subjective: Patient is sitting up in chair, comfortable.  Patient had several bowel movements yesterday.  He feels his abdominal pain is better but diarrhea is annoying.  He received Imodium today.  He underwent repeat CT scan today  Objective: Vital signs in last 24 hours: Vitals:   03/10/20 0500 03/10/20 0555 03/10/20 0555 03/10/20 0713  BP:  (!) 165/71 (!) 165/71 (!) 148/66  Pulse:   70 77  Resp:  20 20   Temp:  97.7 F (36.5 C) 97.7 F (36.5 C)   TempSrc:  Oral Oral   SpO2:   98%   Weight: (!) 97.3 kg     Height:       Weight change: -2.1 kg  Intake/Output Summary (Last 24 hours) at 03/10/2020 1711 Last data filed at 03/10/2020 1505 Gross per 24 hour  Intake 621.81 ml  Output 1100 ml  Net -478.19 ml     Exam: Heart:: Regular rate and rhythm, S1S2 present or without murmur or extra heart sounds Lungs: normal and clear to auscultation Abdomen: soft, focal right lower abdominal tenderness, normal bowel sounds   Lab Results: CBC Latest Ref Rng & Units 03/07/2020 03/06/2020 03/04/2020  WBC 4.0 - 10.5 K/uL 10.0 9.7 9.9  Hemoglobin 13.0 - 17.0 g/dL 11.4(L) 11.8(L) 10.2(L)  Hematocrit 39 - 52 % 32.4(L) 34.8(L) 29.1(L)  Platelets 150 - 400 K/uL 131(L) 140(L) 99(L)   BMP Latest Ref Rng & Units 03/09/2020 03/07/2020 03/06/2020  Glucose 70 - 99 mg/dL 89 113(H) 154(H)  BUN 8 - 23 mg/dL 35(H) 35(H) 38(H)  Creatinine 0.61 - 1.24 mg/dL 2.12(H) 2.20(H) 2.37(H)  Sodium 135 - 145 mmol/L 135 138 134(L)  Potassium 3.5 - 5.1 mmol/L 4.1 4.1 4.2  Chloride 98 - 111 mmol/L 103 107 103  CO2 22 - 32 mmol/L 24 24 23   Calcium 8.9 - 10.3 mg/dL 8.0(L) 8.2(L) 8.4(L)    Micro Results: Recent Results (from the past 240 hour(s))  SARS Coronavirus 2 by RT PCR (hospital order, performed in [facility] hospital lab) Nasopharyngeal Nasopharyngeal Swab      Status: None   Collection Time: 03/01/20  8:53 PM   Specimen: Nasopharyngeal Swab  Result Value Ref Range Status   SARS Coronavirus 2 NEGATIVE NEGATIVE Final    Comment: (NOTE) SARS-CoV-2 target nucleic acids are NOT DETECTED.  The SARS-CoV-2 RNA is generally detectable in upper and lower respiratory specimens during the acute phase of infection. The lowest concentration of SARS-CoV-2 viral copies this assay can detect is 250 copies / mL. A negative result does not preclude SARS-CoV-2 infection and should not be used as the sole basis for treatment or other patient management decisions.  A negative result may occur with improper specimen collection / handling, submission of specimen other than nasopharyngeal swab, presence of viral mutation(s) within the areas targeted by this assay, and inadequate number of viral copies (<250 copies / mL). A negative result must be combined with clinical observations, patient history, and epidemiological information.  Fact Sheet for Patients:   StrictlyIdeas.no  Fact Sheet for Healthcare Providers: BankingDealers.co.za  This test is not yet approved or  cleared by the [place] FDA and has been authorized for detection and/or diagnosis of SARS-CoV-2 by FDA under an Emergency Use Authorization (EUA).  This EUA will remain in effect (meaning this test can be used) for the  duration of the COVID-19 declaration under Section 564(b)(1) of the Act, 21 U.S.C. section 360bbb-3(b)(1), unless the authorization is terminated or revoked sooner.  Performed at [facility], [address] [place], [place] [address]   Blood Culture (routine x 2)     Status: None   Collection Time: 03/01/20  8:53 PM   Specimen: BLOOD  Result Value Ref Range Status   Specimen Description BLOOD BLOOD LEFT WRIST  Final   Special Requests   Final    BOTTLES DRAWN AEROBIC AND ANAEROBIC Blood Culture results may not be  optimal due to an inadequate volume of blood received in culture bottles   Culture   Final    NO GROWTH 5 DAYS Performed at [facility], [address], [place], [place] [address]    Report Status 03/06/2020 FINAL  Final  Blood Culture (routine x 2)     Status: None   Collection Time: 03/01/20  8:53 PM   Specimen: BLOOD  Result Value Ref Range Status   Specimen Description BLOOD RIGHT ANTECUBITAL  Final   Special Requests   Final    BOTTLES DRAWN AEROBIC AND ANAEROBIC Blood Culture adequate volume   Culture   Final    NO GROWTH 5 DAYS Performed at [facility], [address] [place], [place] [address]    Report Status 03/06/2020 FINAL  Final  C Difficile Quick Screen w PCR reflex     Status: None   Collection Time: 03/03/20  6:13 PM   Specimen: STOOL  Result Value Ref Range Status   C Diff antigen NEGATIVE NEGATIVE Final   C Diff toxin NEGATIVE NEGATIVE Final   C Diff interpretation No C. difficile detected.  Final    Comment: Performed at [facility], [address] [place], [place] [address]  SARS Coronavirus 2 by RT PCR (hospital order, performed in [facility] hospital lab) Nasopharyngeal Nasopharyngeal Swab     Status: None   Collection Time: 03/06/20  9:23 PM   Specimen: Nasopharyngeal Swab  Result Value Ref Range Status   SARS Coronavirus 2 NEGATIVE NEGATIVE Final    Comment: (NOTE) SARS-CoV-2 target nucleic acids are NOT DETECTED.  The SARS-CoV-2 RNA is generally detectable in upper and lower respiratory specimens during the acute phase of infection. The lowest concentration of SARS-CoV-2 viral copies this assay can detect is 250 copies / mL. A negative result does not preclude SARS-CoV-2 infection and should not be used as the sole basis for treatment or other patient management decisions.  A negative result may occur with improper specimen collection / handling, submission of specimen other than nasopharyngeal swab, presence of  viral mutation(s) within the areas targeted by this assay, and inadequate number of viral copies (<250 copies / mL). A negative result must be combined with clinical observations, patient history, and epidemiological information.  Fact Sheet for Patients:   StrictlyIdeas.no  Fact Sheet for Healthcare Providers: BankingDealers.co.za  This test is not yet approved or  cleared by the [place] FDA and has been authorized for detection and/or diagnosis of SARS-CoV-2 by FDA under an Emergency Use Authorization (EUA).  This EUA will remain in effect (meaning this test can be used) for the duration of the COVID-19 declaration under Section 564(b)(1) of the Act, 21 U.S.C. section 360bbb-3(b)(1), unless the authorization is terminated or revoked sooner.  Performed at [facility], [address] [place], [place] [address]    Studies/Results: CT ABDOMEN PELVIS WO CONTRAST  Result Date:  03/10/2020 CLINICAL DATA:  Right lower quadrant abdominal pain with diarrhea for 1 week. Recently diagnosed with diverticulitis. EXAM: CT ABDOMEN AND PELVIS WITHOUT CONTRAST TECHNIQUE: Multidetector CT imaging of the abdomen and pelvis was performed following the standard protocol without IV contrast. COMPARISON:  CT 03/06/2020 FINDINGS: Lower chest: Similar appearance of the lung bases with small right-greater-than-left pleural effusions, probable bibasilar atelectasis and mild central airway thickening. There is atherosclerosis of the aorta and coronary arteries. Hepatobiliary: The liver appears unremarkable as imaged in the noncontrast state. No evidence of gallstones, gallbladder wall thickening or biliary dilatation. Pancreas: Unremarkable. No pancreatic ductal dilatation or surrounding inflammatory changes. Spleen: Normal in size without focal abnormality. Adrenals/Urinary Tract: Both adrenal glands appear normal. Stable small nonobstructing calculus in  the upper pole of the right kidney. No other urinary tract calculi are seen. There are renovascular calcifications bilaterally and a probable cyst in the interpolar region of the left kidney. No hydronephrosis or perinephric soft tissue stranding. Possible mild bladder wall thickening without focal abnormality. Stomach/Bowel: Enteric contrast was administered and has passed into distal colon. The stomach and proximal small bowel appear normal aside from a stable prominent diverticulum involving the 3rd portion of the duodenum, without associated inflammation. Extensive diverticulosis is again noted within the distal small bowel with associated wall thickening and surrounding inflammation. Suspected inflamed diverticulum best seen on coronal image 40/5. No extravasated enteric contrast identified. Relatively mild diverticular changes of the sigmoid colon are present without wall thickening. No evidence of bowel obstruction. Vascular/Lymphatic: There are no enlarged abdominal or pelvic lymph nodes. Aortic and branch vessel atherosclerosis. Reproductive: Prostate brachytherapy seeds are again noted. Other: Prominent fat in both inguinal canals. Central mesenteric edema without focal extraluminal fluid collection, free air or generalized ascites. Musculoskeletal: No acute or significant osseous findings. Stable degenerative changes in the spine associated with a convex right scoliosis. IMPRESSION: 1. Similar findings of distal small bowel diverticulitis. No evidence of bowel obstruction, extravasated contrast or abscess. 2. Stable nonobstructing right renal calculus. 3. Stable small right-greater-than-left pleural effusions and bibasilar atelectasis. 4. Aortic Atherosclerosis (ICD10-I70.0). Electronically Signed   By: Richardean Sale M.D.   On: 03/10/2020 16:43   Medications:  I have reviewed the patient's current medications. Prior to Admission:  Medications Prior to Admission  Medication Sig Dispense Refill Last  Dose  . aspirin EC 81 MG tablet Take 1 tablet (81 mg total) by mouth daily. (Patient taking differently: Take 81 mg by mouth 2 (two) times daily. ) 90 tablet 3 03/06/2020 at 0930  . Cholecalciferol (VITAMIN D-3) 25 MCG (1000 UT) CAPS Take 1,000 Units by mouth daily.    03/06/2020 at 0930  . escitalopram (LEXAPRO) 10 MG tablet Take 10 mg by mouth daily.    03/06/2020 at 0930  . furosemide (LASIX) 20 MG tablet Take 1 tablet (20 mg total) by mouth daily as needed. Use sparingly. Take potassium when you take furosemide. 30 tablet 6 PRN at PRN  . losartan-hydrochlorothiazide (HYZAAR) 100-25 MG per tablet Take 1 tablet by mouth daily.   03/06/2020 at 0930  . potassium chloride SA (K-DUR,KLOR-CON) 20 MEQ tablet Take 1 tablet (20 mEq total) by mouth daily as needed. Take when you take the furosemide. 30 tablet 6 PRN at PRN  . traZODone (DESYREL) 150 MG tablet Take 150 mg by mouth at bedtime.   03/05/2020 at Unknown time  . triamcinolone (KENALOG) 0.025 % cream Apply topically 2 (two) times daily.   03/06/2020 at 0930  . dextromethorphan-guaiFENesin [facility] DM)  30-600 MG 12hr tablet Take 1 tablet by mouth 2 (two) times daily. (Patient not taking: Reported on 03/06/2020) 30 tablet 0 Not Taking at Unknown time  . [EXPIRED] metroNIDAZOLE (FLAGYL) 500 MG tablet Take 1 tablet (500 mg total) by mouth 3 (three) times daily for 5 days. (Patient not taking: Reported on 03/06/2020) 15 tablet 0 Not Taking at Unknown time  . ondansetron (ZOFRAN) 4 MG tablet Take 1 tablet (4 mg total) by mouth every 6 (six) hours as needed for nausea. (Patient not taking: Reported on 03/06/2020) 20 tablet 0 Not Taking at Unknown time  . [EXPIRED] sulfamethoxazole-trimethoprim (BACTRIM DS) 800-160 MG tablet Take 1 tablet by mouth 2 (two) times daily for 5 days. (Patient not taking: Reported on 03/06/2020) 10 tablet 0 Not Taking at Unknown time   Scheduled: . acidophilus  2 capsule Oral TID  . aspirin EC  81 mg Oral Daily  . cholecalciferol  1,000  Units Oral Daily  . escitalopram  10 mg Oral Daily  . heparin  5,000 Units Subcutaneous Q8H  . hydrocortisone  25 mg Rectal BID  . losartan  25 mg Oral Daily  . traZODone  150 mg Oral QHS  . triamcinolone cream   Topical BID   Continuous: . sodium chloride Stopped (03/09/20 2154)  . piperacillin-tazobactam (ZOSYN)  IV 3.375 g (03/10/20 1516)   DSK:AJGOTL chloride, acetaminophen **OR** acetaminophen, hydrALAZINE, ipratropium-albuterol, loperamide Anti-infectives (From admission, onward)   Start     Dose/Rate Route Frequency Ordered Stop   03/08/20 1200  piperacillin-tazobactam (ZOSYN) IVPB 3.375 g     Discontinue     3.375 g 12.5 mL/hr over 240 Minutes Intravenous Every 8 hours 03/08/20 1036     03/07/20 1200  ciprofloxacin (CIPRO) IVPB 400 mg  Status:  Discontinued        400 mg 200 mL/hr over 60 Minutes Intravenous Every 24 hours 03/07/20 1024 03/08/20 1035   03/07/20 0700  aztreonam (AZACTAM) 1 g in sodium chloride 0.9 % 100 mL IVPB  Status:  Discontinued        1 g 200 mL/hr over 30 Minutes Intravenous Every 8 hours 03/06/20 2147 03/07/20 1024   03/06/20 2200  aztreonam (AZACTAM) 1 g in sodium chloride 0.9 % 100 mL IVPB  Status:  Discontinued        1 g 200 mL/hr over 30 Minutes Intravenous Every 8 hours 03/06/20 2123 03/06/20 2147   03/06/20 2200  metroNIDAZOLE (FLAGYL) IVPB 500 mg  Status:  Discontinued        500 mg 100 mL/hr over 60 Minutes Intravenous Every 8 hours 03/06/20 2147 03/08/20 1035   03/06/20 2130  metroNIDAZOLE (FLAGYL) IVPB 500 mg        500 mg 100 mL/hr over 60 Minutes Intravenous  Once 03/06/20 2123 03/06/20 2237     Scheduled Meds: . acidophilus  2 capsule Oral TID  . aspirin EC  81 mg Oral Daily  . cholecalciferol  1,000 Units Oral Daily  . escitalopram  10 mg Oral Daily  . heparin  5,000 Units Subcutaneous Q8H  . hydrocortisone  25 mg Rectal BID  . losartan  25 mg Oral Daily  . traZODone  150 mg Oral QHS  . triamcinolone cream   Topical BID    Continuous Infusions: . sodium chloride Stopped (03/09/20 2154)  . piperacillin-tazobactam (ZOSYN)  IV 3.375 g (03/10/20 1516)   PRN Meds:.sodium chloride, acetaminophen **OR** acetaminophen, hydrALAZINE, ipratropium-albuterol, loperamide   Assessment: Active Problems:   COPD (chronic obstructive  pulmonary disease) ([place])   Chronic diastolic CHF (congestive heart failure) ([facility])   Essential hypertension   Acute diverticulitis   Abdominal pain   Acute kidney injury superimposed on CKD ([place])   Anemia   Depression   Weakness   Rash  CHARLIS HARNER is a 86 y.o. male with history of E. coli infection s/p treatment with 5 days course of ciprofloxacin admitted with acute uncomplicated distal small bowel diverticulitis.  Plan: Acute small bowel diverticulitis: Cross-sectional imaging with no evidence of perforation or abscess, slowly improving Latest CT revealed worsening of inflammation Repeat CT scan on 7/23 with stable inflammation Switched to Zosyn on 7/21 due to ongoing abdominal pain.  Recommend Cipro and Flagyl at the time of discharge No indication for surgery at this time Serial abdominal exams and consult surgery if patient develops acute abdomen Recommend restricted diet, lactose-free, avoid red meat, processed foods, fatty/greasy foods, carbonated beverages Imodium/Bentyl as needed  Dr. Vicente Males will cover for the weekend   LOS: 4 days   Ameen Mostafa 03/10/2020, 5:11 PM

## 2020-03-10 NOTE — Progress Notes (Signed)
Physical Therapy Treatment Patient Details Name: Trevor Ferguson MRN: [id number] DOB: [birth year] Today's Date: 03/10/2020    History of Present Illness 86 y.o. male with a history of COPD, hypertension, prostate CA sleep apnea and CVA, here last week for weakness and vomiting and discharged 7/17, now returns with increased abdominal pain.    PT Comments    Pt again very pleasant and eager to work with PT.  Pt on 3L O2 t/o the effort with sats generally staying in the mid 90s, though with brief attempt at activity on room air sats dropped into the 80s and we needed to reapply O2.  He was eager to try and go for a prolonged walk but was less steady today w/o AD and we opted to use FWW.  He was very obviously more confident and steady with Benjamin and showed good cadence and consistency with the effort but similar to last PT session he started having increased DOE and shortness of breath with need for seated rest break on return to room.  Follow Up Recommendations  SNF     Equipment Recommendations  None recommended by PT    Recommendations for Other Services       Precautions / Restrictions Precautions Precautions: Fall Restrictions Weight Bearing Restrictions: No    Mobility  Bed Mobility Overal bed mobility: Modified Independent             General bed mobility comments: Pt was able to get himself to EOB w/o direct assist  Transfers Overall transfer level: Needs assistance Equipment used: Rolling [place] (2 wheeled);None Transfers: Sit to/from Stand Sit to Stand: Min guard         General transfer comment: multiple sit to stand efforts today, initially with [place] as pt is feeling weaker and less steady today.  On later attempts he was able to rise and maintain balance w/o AD but was guarded and staying close to the chair/bed or reaching for PT's hand or funiture  Ambulation/Gait Ambulation/Gait assistance: Min guard Gait Distance (Feet): 105 Feet Assistive device:  Rolling [place] (2 wheeled)       General Gait Details: Took a few steps w/o AD this date but pt had some stagger stepping/unsteadiness and we decided to go with the [place] this date.  Pt with much steadier and confident cadence with AD.  Pt on 3L during the effort with shortness of breath and increasing DOE with increased distance.  On 3L O2 dropped to low 90s but was relatively stable.     Stairs             Wheelchair Mobility    Modified Rankin (Stroke Patients Only)       Balance Overall balance assessment: Needs assistance   Sitting balance-Leahy Scale: Good       Standing balance-Leahy Scale: Fair                              Cognition Arousal/Alertness: Awake/alert Behavior During Therapy: WFL for tasks assessed/performed Overall Cognitive Status: Within Functional Limits for tasks assessed                                        Exercises Other Exercises Other Exercises: balance exercises including: heel raises with light b/l HHA, shoulder width static standing with eyes closed with moderate perturbations, increased HHA SLS attempt (unable  to hold more than ~3 seconds before heavy UE use required), semi-tandem standing with light HHA (unable to attain/maintain full tandem standing) - multiple bouts of each with rest breaks and reminders to breath (both through nose and as well simply not holding breath)    General Comments General comments (skin integrity, edema, etc.): trial of minimal in room activity on room air with sats consistently dropping and down to hig 80s relatively quickly - reapplied O2 with quick return to mid 90s (sustained mid 90s on 3L with activity).      Pertinent Vitals/Pain Pain Assessment: No/denies pain    Home Living                      Prior Function            PT Goals (current goals can now be found in the care plan section) Progress towards PT goals: Progressing toward goals     Frequency    Min 2X/week      PT Plan Current plan remains appropriate    Co-evaluation              AM-PAC PT "6 Clicks" Mobility   Outcome Measure  Help needed turning from your back to your side while in a flat bed without using bedrails?: A Little Help needed moving from lying on your back to sitting on the side of a flat bed without using bedrails?: A Little Help needed moving to and from a bed to a chair (including a wheelchair)?: A Little Help needed standing up from a chair using your arms (e.g., wheelchair or bedside chair)?: A Little Help needed to walk in hospital room?: A Little Help needed climbing 3-5 steps with a railing? : A Little 6 Click Score: 18    End of Session Equipment Utilized During Treatment: Gait belt;Oxygen Activity Tolerance: Patient tolerated treatment well Patient left: in chair;with call bell/phone within reach Nurse Communication: Mobility status PT Visit Diagnosis: Unsteadiness on feet (R26.81);Other abnormalities of gait and mobility (R26.89);Muscle weakness (generalized) (M62.81);History of falling (Z91.81)     Time: 5379-4327 PT Time Calculation (min) (ACUTE ONLY): 29 min  Charges:  $Gait Training: 8-22 mins $Therapeutic Exercise: 8-22 mins                     Kreg Shropshire, DPT 03/10/2020, 12:59 PM

## 2020-03-10 NOTE — TOC Progression Note (Addendum)
Transition of Care [facility]) - Progression Note    Patient Details  Name: Trevor Ferguson MRN: [id number] Date of Birth: 04-18-1926  Transition of Care [facility]) CM/SW [place], LCSW Phone Number: 03/10/2020, 10:16 AM  Clinical Narrative:  Insurance authorization is still pending.   2:40 pm: Uploaded today's PT note into [facility] portal.  5:15 pm: Insurance authorization is still pending.  Expected Discharge Plan: [place] Barriers to Discharge: Continued Medical Work up, Ship broker  Expected Discharge Plan and Services Expected Discharge Plan: [place] Choice: [place] Living arrangements for the past 2 months: [place]                                       Social Determinants of Health (SDOH) Interventions    Readmission Risk Interventions Readmission Risk Prevention Plan 07/19/2018  Transportation Screening Complete  PCP or Specialist Appt within 5-7 Days Complete  Home Care Screening Complete  Medication Review (RN CM) Complete  Some recent data might be hidden

## 2020-03-11 DIAGNOSIS — K5792 Diverticulitis of intestine, part unspecified, without perforation or abscess without bleeding: Secondary | ICD-10-CM

## 2020-03-11 LAB — COMPREHENSIVE METABOLIC PANEL
ALT: 9 U/L (ref 0–44)
AST: 14 U/L — ABNORMAL LOW (ref 15–41)
Albumin: 3.3 g/dL — ABNORMAL LOW (ref 3.5–5.0)
Alkaline Phosphatase: 40 U/L (ref 38–126)
Anion gap: 10 (ref 5–15)
BUN: 29 mg/dL — ABNORMAL HIGH (ref 8–23)
CO2: 24 mmol/L (ref 22–32)
Calcium: 8.4 mg/dL — ABNORMAL LOW (ref 8.9–10.3)
Chloride: 105 mmol/L (ref 98–111)
Creatinine, Ser: 1.75 mg/dL — ABNORMAL HIGH (ref 0.61–1.24)
GFR calc Af Amer: 38 mL/min — ABNORMAL LOW (ref >60)
GFR calc non Af Amer: 33 mL/min — ABNORMAL LOW (ref >60)
Glucose, Bld: 93 mg/dL (ref 70–99)
Potassium: 4 mmol/L (ref 3.5–5.1)
Sodium: 139 mmol/L (ref 135–145)
Total Bilirubin: 0.8 mg/dL (ref 0.3–1.2)
Total Protein: 6 g/dL — ABNORMAL LOW (ref 6.5–8.1)

## 2020-03-11 LAB — CBC WITH DIFFERENTIAL/PLATELET
Abs Immature Granulocytes: 0.13 10*3/uL — ABNORMAL HIGH (ref 0.00–0.07)
Basophils Absolute: 0.1 10*3/uL (ref 0.0–0.1)
Basophils Relative: 1 %
Eosinophils Absolute: 0.1 10*3/uL (ref 0.0–0.5)
Eosinophils Relative: 1 %
HCT: 30.7 % — ABNORMAL LOW (ref 39.0–52.0)
Hemoglobin: 10.8 g/dL — ABNORMAL LOW (ref 13.0–17.0)
Immature Granulocytes: 2 %
Lymphocytes Relative: 14 %
Lymphs Abs: 0.9 10*3/uL (ref 0.7–4.0)
MCH: 32.1 pg (ref 26.0–34.0)
MCHC: 35.2 g/dL (ref 30.0–36.0)
MCV: 91.4 fL (ref 80.0–100.0)
Monocytes Absolute: 0.8 10*3/uL (ref 0.1–1.0)
Monocytes Relative: 12 %
Neutro Abs: 4.4 10*3/uL (ref 1.7–7.7)
Neutrophils Relative %: 70 %
Platelets: 150 10*3/uL (ref 150–400)
RBC: 3.36 MIL/uL — ABNORMAL LOW (ref 4.22–5.81)
RDW: 14.7 % (ref 11.5–15.5)
WBC: 6.3 10*3/uL (ref 4.0–10.5)
nRBC: 0 % (ref 0.0–0.2)

## 2020-03-11 MED ORDER — AMLODIPINE BESYLATE 5 MG PO TABS
5.0000 mg | ORAL_TABLET | Freq: Every day | ORAL | Status: DC
  Administered 2020-03-11 – 2020-03-13 (×3): 5 mg via ORAL
  Filled 2020-03-11 (×3): qty 1

## 2020-03-11 NOTE — Plan of Care (Signed)
Continuing with plan of care. 

## 2020-03-11 NOTE — Progress Notes (Signed)
Trevor Ferguson , MD [address] [place], [place], [place], [address] [address] [place], [place], [place], [address] Phone: [phone]  Fax: [phone]   Trevor Ferguson is being followed for acute diverticulitis    Subjective: Some abdominal pain not worse than day prior    Objective: Vital signs in last 24 hours: Vitals:   03/10/20 0713 03/10/20 2009 03/11/20 0419 03/11/20 0500  BP: (!) 148/66 (!) 139/59 (!) 140/63   Pulse: 77 64 59   Resp:  20 20   Temp:  97.9 F (36.6 C) 97.6 F (36.4 C)   TempSrc:  Oral Oral   SpO2:  98% 98%   Weight:    (!) 97.5 kg  Height:       Weight change: 0.2 kg  Intake/Output Summary (Last 24 hours) at 03/11/2020 4854 Last data filed at 03/11/2020 0604 Gross per 24 hour  Intake 530 ml  Output 200 ml  Net 330 ml     Exam: Heart:: Regular rate and rhythm, S1S2 present or without murmur or extra heart sounds Lungs: normal, clear to auscultation and clear to auscultation and percussion Abdomen: soft, nontender, normal bowel sounds   Lab Results: @LABTEST2 @ Micro Results: Recent Results (from the past 240 hour(s))  SARS Coronavirus 2 by RT PCR (hospital order, performed in [place] hospital lab) Nasopharyngeal Nasopharyngeal Swab     Status: None   Collection Time: 03/01/20  8:53 PM   Specimen: Nasopharyngeal Swab  Result Value Ref Range Status   SARS Coronavirus 2 NEGATIVE NEGATIVE Final    Comment: (NOTE) SARS-CoV-2 target nucleic acids are NOT DETECTED.  The SARS-CoV-2 RNA is generally detectable in upper and lower respiratory specimens during the acute phase of infection. The lowest concentration of SARS-CoV-2 viral copies this assay can detect is 250 copies / mL. A negative result does not preclude SARS-CoV-2 infection and should not be used as the sole basis for treatment or other patient management decisions.  A negative result may occur with improper specimen collection / handling, submission of specimen  other than nasopharyngeal swab, presence of viral mutation(s) within the areas targeted by this assay, and inadequate number of viral copies (<250 copies / mL). A negative result must be combined with clinical observations, patient history, and epidemiological information.  Fact Sheet for Patients:   StrictlyIdeas.no  Fact Sheet for Healthcare Providers: BankingDealers.co.za  This test is not yet approved or  cleared by the [place] FDA and has been authorized for detection and/or diagnosis of SARS-CoV-2 by FDA under an Emergency Use Authorization (EUA).  This EUA will remain in effect (meaning this test can be used) for the duration of the COVID-19 declaration under Section 564(b)(1) of the Act, 21 U.S.C. section 360bbb-3(b)(1), unless the authorization is terminated or revoked sooner.  Performed at [facility], [address] [place], [place] [address]   Blood Culture (routine x 2)     Status: None   Collection Time: 03/01/20  8:53 PM   Specimen: BLOOD  Result Value Ref Range Status   Specimen Description BLOOD BLOOD LEFT WRIST  Final   Special Requests   Final    BOTTLES DRAWN AEROBIC AND ANAEROBIC Blood Culture results may not be optimal due to an inadequate volume of blood received in culture bottles   Culture   Final    NO GROWTH 5 DAYS Performed at [facility], [address], [place], [place] [address]    Report Status 03/06/2020 FINAL  Final  Blood Culture (routine x 2)     Status: None   Collection Time: 03/01/20  8:53 PM   Specimen: BLOOD  Result Value Ref Range Status   Specimen Description BLOOD RIGHT ANTECUBITAL  Final   Special Requests   Final    BOTTLES DRAWN AEROBIC AND ANAEROBIC Blood Culture adequate volume   Culture   Final    NO GROWTH 5 DAYS Performed at [facility], [address], [place], [place] [address]    Report Status 03/06/2020 FINAL  Final  C Difficile  Quick Screen w PCR reflex     Status: None   Collection Time: 03/03/20  6:13 PM   Specimen: STOOL  Result Value Ref Range Status   C Diff antigen NEGATIVE NEGATIVE Final   C Diff toxin NEGATIVE NEGATIVE Final   C Diff interpretation No C. difficile detected.  Final    Comment: Performed at [facility], [address] [place], [place] [address]  SARS Coronavirus 2 by RT PCR (hospital order, performed in [facility] hospital lab) Nasopharyngeal Nasopharyngeal Swab     Status: None   Collection Time: 03/06/20  9:23 PM   Specimen: Nasopharyngeal Swab  Result Value Ref Range Status   SARS Coronavirus 2 NEGATIVE NEGATIVE Final    Comment: (NOTE) SARS-CoV-2 target nucleic acids are NOT DETECTED.  The SARS-CoV-2 RNA is generally detectable in upper and lower respiratory specimens during the acute phase of infection. The lowest concentration of SARS-CoV-2 viral copies this assay can detect is 250 copies / mL. A negative result does not preclude SARS-CoV-2 infection and should not be used as the sole basis for treatment or other patient management decisions.  A negative result may occur with improper specimen collection / handling, submission of specimen other than nasopharyngeal swab, presence of viral mutation(s) within the areas targeted by this assay, and inadequate number of viral copies (<250 copies / mL). A negative result must be combined with clinical observations, patient history, and epidemiological information.  Fact Sheet for Patients:   StrictlyIdeas.no  Fact Sheet for Healthcare Providers: BankingDealers.co.za  This test is not yet approved or  cleared by the [place] FDA and has been authorized for detection and/or diagnosis of SARS-CoV-2 by FDA under an Emergency Use Authorization (EUA).  This EUA will remain in effect (meaning this test can be used) for the duration of the COVID-19 declaration under Section  564(b)(1) of the Act, 21 U.S.C. section 360bbb-3(b)(1), unless the authorization is terminated or revoked sooner.  Performed at [facility], [address] [place], [place] [address]    Studies/Results: CT ABDOMEN PELVIS WO CONTRAST  Result Date: 03/10/2020 CLINICAL DATA:  Right lower quadrant abdominal pain with diarrhea for 1 week. Recently diagnosed with diverticulitis. EXAM: CT ABDOMEN AND PELVIS WITHOUT CONTRAST TECHNIQUE: Multidetector CT imaging of the abdomen and pelvis was performed following the standard protocol without IV contrast. COMPARISON:  CT 03/06/2020 FINDINGS: Lower chest: Similar appearance of the lung bases with small right-greater-than-left pleural effusions, probable bibasilar atelectasis and mild central airway thickening. There is atherosclerosis of the aorta and coronary arteries. Hepatobiliary: The liver appears unremarkable as imaged in the noncontrast state. No evidence of gallstones, gallbladder wall thickening or biliary dilatation. Pancreas: Unremarkable. No pancreatic ductal dilatation or surrounding inflammatory changes. Spleen: Normal in size without focal abnormality. Adrenals/Urinary Tract: Both adrenal glands appear normal. Stable small nonobstructing calculus in the upper pole of the right kidney. No other urinary tract calculi are seen. There  are renovascular calcifications bilaterally and a probable cyst in the interpolar region of the left kidney. No hydronephrosis or perinephric soft tissue stranding. Possible mild bladder wall thickening without focal abnormality. Stomach/Bowel: Enteric contrast was administered and has passed into distal colon. The stomach and proximal small bowel appear normal aside from a stable prominent diverticulum involving the 3rd portion of the duodenum, without associated inflammation. Extensive diverticulosis is again noted within the distal small bowel with associated wall thickening and surrounding inflammation.  Suspected inflamed diverticulum best seen on coronal image 40/5. No extravasated enteric contrast identified. Relatively mild diverticular changes of the sigmoid colon are present without wall thickening. No evidence of bowel obstruction. Vascular/Lymphatic: There are no enlarged abdominal or pelvic lymph nodes. Aortic and branch vessel atherosclerosis. Reproductive: Prostate brachytherapy seeds are again noted. Other: Prominent fat in both inguinal canals. Central mesenteric edema without focal extraluminal fluid collection, free air or generalized ascites. Musculoskeletal: No acute or significant osseous findings. Stable degenerative changes in the spine associated with a convex right scoliosis. IMPRESSION: 1. Similar findings of distal small bowel diverticulitis. No evidence of bowel obstruction, extravasated contrast or abscess. 2. Stable nonobstructing right renal calculus. 3. Stable small right-greater-than-left pleural effusions and bibasilar atelectasis. 4. Aortic Atherosclerosis (ICD10-I70.0). Electronically Signed   By: Richardean Sale M.D.   On: 03/10/2020 16:43   Medications: I have reviewed the patient's current medications. Scheduled Meds: . acidophilus  2 capsule Oral TID  . aspirin EC  81 mg Oral Daily  . cholecalciferol  1,000 Units Oral Daily  . escitalopram  10 mg Oral Daily  . heparin  5,000 Units Subcutaneous Q8H  . hydrocortisone  25 mg Rectal BID  . losartan  25 mg Oral Daily  . traZODone  150 mg Oral QHS  . triamcinolone cream   Topical BID   Continuous Infusions: . sodium chloride Stopped (03/09/20 2154)  . piperacillin-tazobactam (ZOSYN)  IV 3.375 g (03/11/20 0547)   PRN Meds:.sodium chloride, acetaminophen **OR** acetaminophen, hydrALAZINE, ipratropium-albuterol, loperamide   Assessment: Active Problems:   COPD (chronic obstructive pulmonary disease) (HCC)   Chronic diastolic CHF (congestive heart failure) (HCC)   Essential hypertension   Acute diverticulitis    Abdominal pain   Acute kidney injury superimposed on CKD ([place])   Anemia   Depression   Weakness   Rash  Jawanza Zambito Walkeris a 86 y.o.malewithhistory of E. coli infection s/p treatment with 5 days course of ciprofloxacin admitted with acute uncomplicated distal small bowel diverticulitis.Ct 03/10/2020 shows stable inflammation    Plan: 1. Continue antibiotics ,serial abdominal exam . If pain worsens would require further imaging +/- surgical evaluation  No further GI input .  I will sign off.  Please call me if any further GI concerns or questions.  We would like to thank you for the opportunity to participate in the care of Trevor Ferguson.    LOS: 5 days   Trevor Bellows, MD 03/11/2020, 8:37 AM

## 2020-03-11 NOTE — TOC Progression Note (Addendum)
Transition of Care [facility]) - Progression Note    Patient Details  Name: Trevor Ferguson MRN: [id number] Date of Birth: 09/20/1925  Transition of Care [facility]) CM/SW [place], [place] Phone Number: 03/11/2020, 10:16 AM  Clinical Narrative:     10:00am - CSW called [place] and talked with Izora Gala from [facility]. Izora Gala gave an authorization approval effective 03/11/2020 for [facility]. Izora Gala stated that [facility] is not contracted with the patient's PPO plan. He can still go but he may have higher out of pocket cost (if he has a Consulting civil engineer then it could cover). The next review for authorization will be 03/14/2020. Izora Gala stated that she will call [facility] with authorization approval information.  10:15am: CSW talked to Seth Bake from [facility]. Bed is available for patient. Doctor stated that he is not ready today but will reassess early in the morning. CSW informed Seth Bake and will call her in the morning.    Expected Discharge Plan: [place] Barriers to Discharge: Continued Medical Work up, Ship broker  Expected Discharge Plan and Services Expected Discharge Plan: [place] Choice: [place] Living arrangements for the past 2 months: [place]                                       Social Determinants of Health (SDOH) Interventions    Readmission Risk Interventions Readmission Risk Prevention Plan 07/19/2018  Transportation Screening Complete  PCP or Specialist Appt within 5-7 Days Complete  Home Care Screening Complete  Medication Review (RN CM) Complete  Some recent data might be hidden

## 2020-03-11 NOTE — Progress Notes (Signed)
Patient ID: Trevor Ferguson, male   DOB: November 09, 1925, 86 y.o.   MRN: [id number] [facility] PROGRESS NOTE  Trevor Ferguson QMV:[id number] DOB: 04-09-1926 DOA: 03/06/2020 PCP: Juluis Pitch, MD  HPI/Subjective: Patient still having abdominal pain and diarrhea but today is the first day that he told me that he is feeling slightly better.  Patient was readmitted with diverticulitis.  Objective: Vitals:   03/11/20 1144 03/11/20 1432  BP: (!) 179/89 (!) 140/58  Pulse: 60 62  Resp:    Temp:    SpO2:  98%    Intake/Output Summary (Last 24 hours) at 03/11/2020 1603 Last data filed at 03/11/2020 1500 Gross per 24 hour  Intake 167.42 ml  Output 500 ml  Net -332.58 ml   Filed Weights   03/09/20 0500 03/10/20 0500 03/11/20 0500  Weight: 99.4 kg (!) 97.3 kg (!) 97.5 kg    ROS: Review of Systems  Respiratory: Positive for shortness of breath.   Gastrointestinal: Positive for abdominal pain and diarrhea. Negative for nausea and vomiting.   Exam: Physical Exam HENT:     Head: Normocephalic.     Mouth/Throat:     Pharynx: No oropharyngeal exudate.  Eyes:     General: Lids are normal.     Conjunctiva/sclera: Conjunctivae normal.     Pupils: Pupils are equal, round, and reactive to light.  Cardiovascular:     Rate and Rhythm: Normal rate and regular rhythm.     Heart sounds: Normal heart sounds, S1 normal and S2 normal.  Pulmonary:     Breath sounds: No decreased breath sounds, wheezing, rhonchi or rales.  Abdominal:     Palpations: Abdomen is soft.     Tenderness: There is abdominal tenderness in the right lower quadrant.  Musculoskeletal:     Right ankle: Swelling present.     Left ankle: Swelling present.  Skin:    General: Skin is warm.  Neurological:     Mental Status: He is alert and oriented to person, place, and time.       Data Reviewed: Basic Metabolic Panel: Recent Labs  Lab 03/06/20 1628 03/07/20 0112 03/09/20 0808 03/11/20 0608  NA 134* 138 135  139  K 4.2 4.1 4.1 4.0  CL 103 107 103 105  CO2 23 24 24 24   GLUCOSE 154* 113* 89 93  BUN 38* 35* 35* 29*  CREATININE 2.37* 2.20* 2.12* 1.75*  CALCIUM 8.4* 8.2* 8.0* 8.4*   Liver Function Tests: Recent Labs  Lab 03/06/20 1628 03/07/20 0112 03/11/20 0608  AST 17 16 14*  ALT 12 12 9   ALKPHOS 59 52 40  BILITOT 0.6 0.8 0.8  PROT 6.8 6.3* 6.0*  ALBUMIN 3.7 3.7 3.3*   Recent Labs  Lab 03/06/20 1628  LIPASE 31   CBC: Recent Labs  Lab 03/06/20 1628 03/07/20 0112 03/11/20 0608  WBC 9.7 10.0 6.3  NEUTROABS  --   --  4.4  HGB 11.8* 11.4* 10.8*  HCT 34.8* 32.4* 30.7*  MCV 93.8 92.0 91.4  PLT 140* 131* 150   BNP (last 3 results) Recent Labs    03/01/20 1907 03/06/20 1628  BNP 331.9* 445.3*     Recent Results (from the past 240 hour(s))  SARS Coronavirus 2 by RT PCR (hospital order, performed in [facility] hospital lab) Nasopharyngeal Nasopharyngeal Swab     Status: None   Collection Time: 03/01/20  8:53 PM   Specimen: Nasopharyngeal Swab  Result Value Ref Range Status   SARS Coronavirus 2 NEGATIVE  NEGATIVE Final    Comment: (NOTE) SARS-CoV-2 target nucleic acids are NOT DETECTED.  The SARS-CoV-2 RNA is generally detectable in upper and lower respiratory specimens during the acute phase of infection. The lowest concentration of SARS-CoV-2 viral copies this assay can detect is 250 copies / mL. A negative result does not preclude SARS-CoV-2 infection and should not be used as the sole basis for treatment or other patient management decisions.  A negative result may occur with improper specimen collection / handling, submission of specimen other than nasopharyngeal swab, presence of viral mutation(s) within the areas targeted by this assay, and inadequate number of viral copies (<250 copies / mL). A negative result must be combined with clinical observations, patient history, and epidemiological information.  Fact Sheet for Patients:    StrictlyIdeas.no  Fact Sheet for Healthcare Providers: BankingDealers.co.za  This test is not yet approved or  cleared by the [place] FDA and has been authorized for detection and/or diagnosis of SARS-CoV-2 by FDA under an Emergency Use Authorization (EUA).  This EUA will remain in effect (meaning this test can be used) for the duration of the COVID-19 declaration under Section 564(b)(1) of the Act, 21 U.S.C. section 360bbb-3(b)(1), unless the authorization is terminated or revoked sooner.  Performed at [facility], [address] [place], [place] [address]   Blood Culture (routine x 2)     Status: None   Collection Time: 03/01/20  8:53 PM   Specimen: BLOOD  Result Value Ref Range Status   Specimen Description BLOOD BLOOD LEFT WRIST  Final   Special Requests   Final    BOTTLES DRAWN AEROBIC AND ANAEROBIC Blood Culture results may not be optimal due to an inadequate volume of blood received in culture bottles   Culture   Final    NO GROWTH 5 DAYS Performed at [facility], [address], [place], [place] [address]    Report Status 03/06/2020 FINAL  Final  Blood Culture (routine x 2)     Status: None   Collection Time: 03/01/20  8:53 PM   Specimen: BLOOD  Result Value Ref Range Status   Specimen Description BLOOD RIGHT ANTECUBITAL  Final   Special Requests   Final    BOTTLES DRAWN AEROBIC AND ANAEROBIC Blood Culture adequate volume   Culture   Final    NO GROWTH 5 DAYS Performed at [facility], [address], [place], [place] [address]    Report Status 03/06/2020 FINAL  Final  C Difficile Quick Screen w PCR reflex     Status: None   Collection Time: 03/03/20  6:13 PM   Specimen: STOOL  Result Value Ref Range Status   C Diff antigen NEGATIVE NEGATIVE Final   C Diff toxin NEGATIVE NEGATIVE Final   C Diff interpretation No C. difficile detected.  Final    Comment: Performed at [facility], [address] [place], [place] [address]  SARS Coronavirus 2 by RT PCR (hospital order, performed in [facility] hospital lab) Nasopharyngeal Nasopharyngeal Swab     Status: None   Collection Time: 03/06/20  9:23 PM   Specimen: Nasopharyngeal Swab  Result Value Ref Range Status   SARS Coronavirus 2 NEGATIVE NEGATIVE Final    Comment: (NOTE) SARS-CoV-2 target nucleic acids are NOT DETECTED.  The SARS-CoV-2 RNA is generally detectable in upper and lower respiratory specimens during the acute phase of infection. The lowest concentration of SARS-CoV-2 viral copies this assay can detect is 250 copies /  mL. A negative result does not preclude SARS-CoV-2 infection and should not be used as the sole basis for treatment or other patient management decisions.  A negative result may occur with improper specimen collection / handling, submission of specimen other than nasopharyngeal swab, presence of viral mutation(s) within the areas targeted by this assay, and inadequate number of viral copies (<250 copies / mL). A negative result must be combined with clinical observations, patient history, and epidemiological information.  Fact Sheet for Patients:   StrictlyIdeas.no  Fact Sheet for Healthcare Providers: BankingDealers.co.za  This test is not yet approved or  cleared by the [place] FDA and has been authorized for detection and/or diagnosis of SARS-CoV-2 by FDA under an Emergency Use Authorization (EUA).  This EUA will remain in effect (meaning this test can be used) for the duration of the COVID-19 declaration under Section 564(b)(1) of the Act, 21 U.S.C. section 360bbb-3(b)(1), unless the authorization is terminated or revoked sooner.  Performed at [facility], [address] [place], [place] [address]      Studies: CT ABDOMEN PELVIS WO CONTRAST  Result Date: 03/10/2020 CLINICAL DATA:  Right lower  quadrant abdominal pain with diarrhea for 1 week. Recently diagnosed with diverticulitis. EXAM: CT ABDOMEN AND PELVIS WITHOUT CONTRAST TECHNIQUE: Multidetector CT imaging of the abdomen and pelvis was performed following the standard protocol without IV contrast. COMPARISON:  CT 03/06/2020 FINDINGS: Lower chest: Similar appearance of the lung bases with small right-greater-than-left pleural effusions, probable bibasilar atelectasis and mild central airway thickening. There is atherosclerosis of the aorta and coronary arteries. Hepatobiliary: The liver appears unremarkable as imaged in the noncontrast state. No evidence of gallstones, gallbladder wall thickening or biliary dilatation. Pancreas: Unremarkable. No pancreatic ductal dilatation or surrounding inflammatory changes. Spleen: Normal in size without focal abnormality. Adrenals/Urinary Tract: Both adrenal glands appear normal. Stable small nonobstructing calculus in the upper pole of the right kidney. No other urinary tract calculi are seen. There are renovascular calcifications bilaterally and a probable cyst in the interpolar region of the left kidney. No hydronephrosis or perinephric soft tissue stranding. Possible mild bladder wall thickening without focal abnormality. Stomach/Bowel: Enteric contrast was administered and has passed into distal colon. The stomach and proximal small bowel appear normal aside from a stable prominent diverticulum involving the 3rd portion of the duodenum, without associated inflammation. Extensive diverticulosis is again noted within the distal small bowel with associated wall thickening and surrounding inflammation. Suspected inflamed diverticulum best seen on coronal image 40/5. No extravasated enteric contrast identified. Relatively mild diverticular changes of the sigmoid colon are present without wall thickening. No evidence of bowel obstruction. Vascular/Lymphatic: There are no enlarged abdominal or pelvic lymph nodes.  Aortic and branch vessel atherosclerosis. Reproductive: Prostate brachytherapy seeds are again noted. Other: Prominent fat in both inguinal canals. Central mesenteric edema without focal extraluminal fluid collection, free air or generalized ascites. Musculoskeletal: No acute or significant osseous findings. Stable degenerative changes in the spine associated with a convex right scoliosis. IMPRESSION: 1. Similar findings of distal small bowel diverticulitis. No evidence of bowel obstruction, extravasated contrast or abscess. 2. Stable nonobstructing right renal calculus. 3. Stable small right-greater-than-left pleural effusions and bibasilar atelectasis. 4. Aortic Atherosclerosis (ICD10-I70.0). Electronically Signed   By: Richardean Sale M.D.   On: 03/10/2020 16:43    Scheduled Meds: . acidophilus  2 capsule Oral TID  . amLODipine  5 mg Oral Daily  . aspirin EC  81 mg Oral Daily  . cholecalciferol  1,000 Units Oral  Daily  . escitalopram  10 mg Oral Daily  . heparin  5,000 Units Subcutaneous Q8H  . hydrocortisone  25 mg Rectal BID  . losartan  25 mg Oral Daily  . traZODone  150 mg Oral QHS  . triamcinolone cream   Topical BID   Continuous Infusions: . sodium chloride Stopped (03/09/20 2154)  . piperacillin-tazobactam (ZOSYN)  IV Stopped (03/11/20 1454)    Assessment/Plan:  1. Acute diverticulitis of the small bowel.  Repeat CT scan showed diverticulitis of the small bowel.  The patient is on Zosyn since 03/08/2020.  Today the patient feels a little bit better but still having pain and diarrhea. 2. Acute kidney injury on chronic kidney disease stage IIIb.  Creatinine better to 1.75 today. 3. Chronic diastolic congestive heart failure.  No signs of fluid overload currently.  Continue to monitor closely 4. Essential hypertension.  Started back on low-dose losartan yesterday we will give Norvasc today. 5. Depression on Lexapro and trazodone 6. Weakness.  Physical therapy recommends  rehab 7. COPD.  Respiratory status stable.    Code Status:     Code Status Orders  (From admission, onward)         Start     Ordered   03/06/20 2233  Do not attempt resuscitation (DNR)  Continuous       Question Answer Comment  In the event of cardiac or respiratory ARREST Do not call a "code blue"   In the event of cardiac or respiratory ARREST Do not perform Intubation, CPR, defibrillation or ACLS   In the event of cardiac or respiratory ARREST Use medication by any route, position, wound care, and other measures to relive pain and suffering. May use oxygen, suction and manual treatment of airway obstruction as needed for comfort.      03/06/20 2232        Code Status History    Date Active Date Inactive Code Status Order ID Comments User Context   03/06/2020 2232 03/06/2020 2232 Full Code [id number]  Para Skeans, MD ED   03/01/2020 2154 03/04/2020 2008 DNR [id number]  Sidney Ace, Arvella Merles, MD ED   07/17/2018 2112 07/19/2018 1735 DNR [id number]  Demetrios Loll, MD Inpatient   Advance Care Planning Activity    Advance Directive Documentation     Most Recent Value  Type of Advance Directive Healthcare Power of [place], Living will, Out of facility DNR (pink MOST or yellow form)  Pre-existing out of facility DNR order (yellow form or pink MOST form) --  "MOST" Form in Place? --     Family Communication: Spoke with daughter today Disposition Plan: Status is: Inpatient  Dispo: The patient is from: Home              Anticipated d/c is to: Rehab              Anticipated d/c date is: We will give IV antibiotics while here in the hospital.  Reassess Sunday and Monday for continued improvement.              Patient currently being treated for acute diverticulitis.  Patient failed previous treatment and undergoing IV antibiotic treatment with different antibiotic.  Very slow to improve.  Consultants:  Gastroenterology  Antibiotics: -Zosyn  Time spent: 26 minutes  [place]

## 2020-03-11 NOTE — Progress Notes (Signed)
Occupational Therapy Treatment Patient Details Name: Trevor Ferguson MRN: [id number] DOB: Jun 30, 1926 Today's Date: 03/11/2020    History of present illness 86 y.o. male with a history of COPD, hypertension, prostate CA sleep apnea and CVA, here last week for weakness and vomiting and discharged 7/17, now returns with increased abdominal pain.   OT comments  Trevor Ferguson was seen for OT treatment on this date. Upon arrival to room pt awake seated in chair on computer, reporting need to use toilet. Pt requires SUPERVISION + RW and +1 for line mgmt for toilet t/f. SUPERVISION for pericare on commode. Pt unexpectedly urinated on floor while seated on commode - MAX A for LBD in sitting. MIN A don/doff gown. Pt instructed in adapted dressing techniques. Pt making good progress toward goals. Pt continues to benefit from skilled OT services to maximize return to PLOF and minimize risk of future falls, injury, caregiver burden, and readmission. Will continue to follow POC. Discharge recommendation remains appropriate.     Follow Up Recommendations  SNF    Equipment Recommendations  Toilet rise with handles    Recommendations for Other Services      Precautions / Restrictions Precautions Precautions: Fall Restrictions Weight Bearing Restrictions: No       Mobility Bed Mobility               General bed mobility comments: Pt received and left up in chair   Transfers Overall transfer level: Needs assistance Equipment used: Rolling Prabhu (2 wheeled) Transfers: Sit to/from Stand Sit to Stand: Supervision         General transfer comment: RW + SBA sit<>stand at chair. SBA + R rail sit<>stand at commode     Balance Overall balance assessment: Needs assistance Sitting-balance support: No upper extremity supported;Feet supported Sitting balance-Leahy Scale: Good     Standing balance support: No upper extremity supported;During functional activity Standing balance-Leahy Scale:  Fair Standing balance comment: CGA hand hygiene standing                            ADL either performed or assessed with clinical judgement   ADL Overall ADL's : Needs assistance/impaired                                       General ADL Comments: MAX A don/doff B socks seated on commode. SUPERVISION for pericare c leans at commode. RW + SBA for line/lead mgmt for toilet t/f. MIN A don/doff gown seated on commode. SBA hand hygiene standing.     Vision       Perception     Praxis      Cognition Arousal/Alertness: Awake/alert Behavior During Therapy: WFL for tasks assessed/performed Overall Cognitive Status: Within Functional Limits for tasks assessed                                 General Comments: Pleasant, conversational.        Exercises Exercises: Other exercises Other Exercises Other Exercises: Pt educated re: adapted dressing techniques, falls prevention, ECS Other Exercises: Toileting, pericare, don/doff gown, don/doff B socks, sit<>stand x2, sitting/standing balance/tolerance, hand hygiene    Shoulder Instructions       General Comments Pt removed O2 for toilet t/f - desat to 88% on RA standing at toilet, resolved  to 93% c seated rest break on commode.     Pertinent Vitals/ Pain       Pain Assessment: No/denies pain  Home Living                                          Prior Functioning/Environment              Frequency  Min 2X/week        Progress Toward Goals  OT Goals(current goals can now be found in the care plan section)  Progress towards OT goals: Progressing toward goals  Acute Rehab OT Goals Patient Stated Goal: improve strength, balance, activity tolerance at rehab before going home OT Goal Formulation: With patient Time For Goal Achievement: 03/21/20 Potential to Achieve Goals: Good ADL Goals Pt Will Perform Lower Body Dressing: sit to/from stand;with  supervision;with set-up;with adaptive equipment (c LRAD) Pt Will Transfer to Toilet: ambulating;bedside commode;with set-up;with supervision (c LRAD) Pt Will Perform Toileting - Clothing Manipulation and hygiene: sit to/from stand;with supervision;with set-up (c LRAD) Additional ADL Goal #1: Pt will independently verbalize a plan to implement 3 ECS into his daily routines/home environment for improved safety and functional independence upon hospital DC.  Plan Discharge plan remains appropriate;Frequency remains appropriate    Co-evaluation                 AM-PAC OT "6 Clicks" Daily Activity     Outcome Measure   Help from another person eating meals?: A Little Help from another person taking care of personal grooming?: A Little Help from another person toileting, which includes using toliet, bedpan, or urinal?: A Little Help from another person bathing (including washing, rinsing, drying)?: A Lot Help from another person to put on and taking off regular upper body clothing?: A Little Help from another person to put on and taking off regular lower body clothing?: A Lot 6 Click Score: 16    End of Session Equipment Utilized During Treatment: Rolling Mcinerny;Oxygen  OT Visit Diagnosis: Other abnormalities of gait and mobility (R26.89);Muscle weakness (generalized) (M62.81)   Activity Tolerance Patient tolerated treatment well   Patient Left in chair;with call bell/phone within reach   Nurse Communication          Time: 3903-0092 OT Time Calculation (min): 24 min  Charges: OT General Charges $OT Visit: 1 Visit OT Treatments $Self Care/Home Management : 23-37 mins  Dessie Coma, M.S. OTR/L  03/11/20, 3:57 PM

## 2020-03-12 DIAGNOSIS — K649 Unspecified hemorrhoids: Secondary | ICD-10-CM

## 2020-03-12 LAB — SARS CORONAVIRUS 2 BY RT PCR (HOSPITAL ORDER, PERFORMED IN ~~LOC~~ HOSPITAL LAB): SARS Coronavirus 2: NEGATIVE

## 2020-03-12 MED ORDER — TORSEMIDE 20 MG PO TABS
20.0000 mg | ORAL_TABLET | Freq: Every day | ORAL | Status: DC
  Filled 2020-03-12 (×2): qty 1

## 2020-03-12 MED ORDER — HYDROCORTISONE (PERIANAL) 2.5 % EX CREA
TOPICAL_CREAM | Freq: Four times a day (QID) | CUTANEOUS | Status: DC
  Filled 2020-03-12 (×2): qty 28.35

## 2020-03-12 NOTE — Plan of Care (Signed)
Continuing with plan of care. 

## 2020-03-12 NOTE — Progress Notes (Signed)
Patient ID: Trevor Ferguson, male   DOB: 01/20/1926, 86 y.o.   MRN: [id number] [facility] PROGRESS NOTE  HISASHI AMADON LAG:[id number] DOB: 16-Jul-1926 DOA: 03/06/2020 PCP: Juluis Pitch, MD  HPI/Subjective: Patient starting to feel little bit better.  Admitted with diverticulitis and abdominal pain.  Not having pain when he is just sitting there only pain when I am pressing.  Still having diarrhea.  Rectal area very tender and sore.  His hemorrhoids are acting up with all the bowel movements.  Objective: Vitals:   03/12/20 0456 03/12/20 1138  BP: (!) 126/62 (!) 150/70  Pulse: 66 59  Resp: 20 18  Temp: 98.6 F (37 C) 98.4 F (36.9 C)  SpO2: 98% 99%    Intake/Output Summary (Last 24 hours) at 03/12/2020 1604 Last data filed at 03/12/2020 1500 Gross per 24 hour  Intake 224.58 ml  Output 1975 ml  Net -1750.42 ml   Filed Weights   03/10/20 0500 03/11/20 0500 03/12/20 0500  Weight: (!) 97.3 kg (!) 97.5 kg (!) 98.4 kg    ROS: Review of Systems  Respiratory: Positive for shortness of breath.   Cardiovascular: Negative for chest pain.  Gastrointestinal: Positive for abdominal pain and diarrhea.   Exam: Physical Exam HENT:     Nose: No mucosal edema.     Mouth/Throat:     Pharynx: No oropharyngeal exudate.  Eyes:     General: Lids are normal.     Conjunctiva/sclera: Conjunctivae normal.     Pupils: Pupils are equal, round, and reactive to light.  Cardiovascular:     Rate and Rhythm: Normal rate and regular rhythm.     Heart sounds: Normal heart sounds, S1 normal and S2 normal.  Pulmonary:     Breath sounds: No decreased breath sounds, wheezing, rhonchi or rales.  Abdominal:     Palpations: Abdomen is soft.     Tenderness: There is abdominal tenderness in the right lower quadrant.  Musculoskeletal:     Right ankle: Swelling present.     Left ankle: Swelling present.  Skin:    General: Skin is warm.     Findings: No rash.  Neurological:     Mental Status: He  is alert and oriented to person, place, and time.       Data Reviewed: Basic Metabolic Panel: Recent Labs  Lab 03/06/20 1628 03/07/20 0112 03/09/20 0808 03/11/20 0608  NA 134* 138 135 139  K 4.2 4.1 4.1 4.0  CL 103 107 103 105  CO2 23 24 24 24   GLUCOSE 154* 113* 89 93  BUN 38* 35* 35* 29*  CREATININE 2.37* 2.20* 2.12* 1.75*  CALCIUM 8.4* 8.2* 8.0* 8.4*   Liver Function Tests: Recent Labs  Lab 03/06/20 1628 03/07/20 0112 03/11/20 0608  AST 17 16 14*  ALT 12 12 9   ALKPHOS 59 52 40  BILITOT 0.6 0.8 0.8  PROT 6.8 6.3* 6.0*  ALBUMIN 3.7 3.7 3.3*   Recent Labs  Lab 03/06/20 1628  LIPASE 31   CBC: Recent Labs  Lab 03/06/20 1628 03/07/20 0112 03/11/20 0608  WBC 9.7 10.0 6.3  NEUTROABS  --   --  4.4  HGB 11.8* 11.4* 10.8*  HCT 34.8* 32.4* 30.7*  MCV 93.8 92.0 91.4  PLT 140* 131* 150   BNP (last 3 results) Recent Labs    03/01/20 1907 03/06/20 1628  BNP 331.9* 445.3*     Recent Results (from the past 240 hour(s))  C Difficile Quick Screen w PCR reflex  Status: None   Collection Time: 03/03/20  6:13 PM   Specimen: STOOL  Result Value Ref Range Status   C Diff antigen NEGATIVE NEGATIVE Final   C Diff toxin NEGATIVE NEGATIVE Final   C Diff interpretation No C. difficile detected.  Final    Comment: Performed at [facility], [address] [place], [place] [address]  SARS Coronavirus 2 by RT PCR (hospital order, performed in [facility] hospital lab) Nasopharyngeal Nasopharyngeal Swab     Status: None   Collection Time: 03/06/20  9:23 PM   Specimen: Nasopharyngeal Swab  Result Value Ref Range Status   SARS Coronavirus 2 NEGATIVE NEGATIVE Final    Comment: (NOTE) SARS-CoV-2 target nucleic acids are NOT DETECTED.  The SARS-CoV-2 RNA is generally detectable in upper and lower respiratory specimens during the acute phase of infection. The lowest concentration of SARS-CoV-2 viral copies this assay can detect is 250 copies / mL. A negative  result does not preclude SARS-CoV-2 infection and should not be used as the sole basis for treatment or other patient management decisions.  A negative result may occur with improper specimen collection / handling, submission of specimen other than nasopharyngeal swab, presence of viral mutation(s) within the areas targeted by this assay, and inadequate number of viral copies (<250 copies / mL). A negative result must be combined with clinical observations, patient history, and epidemiological information.  Fact Sheet for Patients:   StrictlyIdeas.no  Fact Sheet for Healthcare Providers: BankingDealers.co.za  This test is not yet approved or  cleared by the [place] FDA and has been authorized for detection and/or diagnosis of SARS-CoV-2 by FDA under an Emergency Use Authorization (EUA).  This EUA will remain in effect (meaning this test can be used) for the duration of the COVID-19 declaration under Section 564(b)(1) of the Act, 21 U.S.C. section 360bbb-3(b)(1), unless the authorization is terminated or revoked sooner.  Performed at [facility], [address] [place], [place] [address]       Scheduled Meds: . acidophilus  2 capsule Oral TID  . amLODipine  5 mg Oral Daily  . aspirin EC  81 mg Oral Daily  . cholecalciferol  1,000 Units Oral Daily  . escitalopram  10 mg Oral Daily  . heparin  5,000 Units Subcutaneous Q8H  . hydrocortisone   Rectal QID  . hydrocortisone  25 mg Rectal BID  . losartan  25 mg Oral Daily  . torsemide  20 mg Oral Daily  . traZODone  150 mg Oral QHS  . triamcinolone cream   Topical BID   Continuous Infusions: . sodium chloride Stopped (03/09/20 2154)  . piperacillin-tazobactam (ZOSYN)  IV 12.5 mL/hr at 03/12/20 1500    Assessment/Plan:  1. Acute diverticulitis of the small bowel.  Patient gradually improving since I saw him first.  Patient on Zosyn since 03/08/2020.  Continue IV  antibiotics today.  Potentially out to rehab tomorrow. 2. Acute kidney injury on chronic kidney disease stage IIIb.  Creatinine better to 1.75. 3. Chronic diastolic congestive heart failure.  No beta-blocker secondary to COPD.  Add low-dose torsemide.  Continue losartan low-dose. 4. Essential hypertension.  Continue low-dose Norvasc, losartan and torsemide. 5. Depression on Lexapro and trazodone 6. Weakness.  Physical therapy recommends rehab 7. COPD.  Respiratory status stable.  On nocturnal oxygen. 8. Hemorrhoids start Anusol cream    Code Status:     Code Status Orders  (From admission, onward)         Start  Ordered   03/06/20 2233  Do not attempt resuscitation (DNR)  Continuous       Question Answer Comment  In the event of cardiac or respiratory ARREST Do not call a "code blue"   In the event of cardiac or respiratory ARREST Do not perform Intubation, CPR, defibrillation or ACLS   In the event of cardiac or respiratory ARREST Use medication by any route, position, wound care, and other measures to relive pain and suffering. May use oxygen, suction and manual treatment of airway obstruction as needed for comfort.      03/06/20 2232        Code Status History    Date Active Date Inactive Code Status Order ID Comments User Context   03/06/2020 2232 03/06/2020 2232 Full Code [id number]  Para Skeans, MD ED   03/01/2020 2154 03/04/2020 2008 DNR [id number]  Sidney Ace, Arvella Merles, MD ED   07/17/2018 2112 07/19/2018 1735 DNR [id number]  Demetrios Loll, MD Inpatient   Advance Care Planning Activity    Advance Directive Documentation     Most Recent Value  Type of Advance Directive Healthcare Power of [place], Living will, Out of facility DNR (pink MOST or yellow form)  Pre-existing out of facility DNR order (yellow form or pink MOST form) --  "MOST" Form in Place? --     Family Communication: Spoke with daughter today Disposition Plan: Status is: Inpatient  Dispo: The patient is from:  Home              Anticipated d/c is to: Rehab              Anticipated d/c date is: Potential disposition on Monday, 03/13/2020              Patient currently being treated for acute diverticulitis.  Patient failed previous treatment and undergoing IV antibiotic treatment with different antibiotic.  Very slow to improve.  Consultants:  Gastroenterology  Antibiotics: -Zosyn  Time spent: 27 minutes  [place]

## 2020-03-13 MED ORDER — AMOXICILLIN-POT CLAVULANATE 500-125 MG PO TABS: 1.0000 | ORAL_TABLET | Freq: Two times a day (BID) | ORAL | 0 refills | Status: AC

## 2020-03-13 MED ORDER — AMOXICILLIN-POT CLAVULANATE 875-125 MG PO TABS: 1.0000 | ORAL_TABLET | Freq: Two times a day (BID) | ORAL | 0 refills | Status: DC

## 2020-03-13 MED ORDER — TORSEMIDE 10 MG PO TABS
10.0000 mg | ORAL_TABLET | Freq: Every day | ORAL | Status: DC
  Administered 2020-03-13: 10 mg via ORAL
  Filled 2020-03-13: qty 1

## 2020-03-13 MED ORDER — ACETAMINOPHEN 325 MG PO TABS: 650.0000 mg | ORAL_TABLET | Freq: Four times a day (QID) | ORAL | Status: DC | PRN

## 2020-03-13 MED ORDER — POTASSIUM CHLORIDE CRYS ER 10 MEQ PO TBCR
10.0000 meq | EXTENDED_RELEASE_TABLET | Freq: Every day | ORAL | 0 refills | Status: DC
Start: 2020-03-13 — End: 2020-10-23

## 2020-03-13 MED ORDER — TORSEMIDE 10 MG PO TABS: 10.0000 mg | ORAL_TABLET | Freq: Every day | ORAL | 0 refills | Status: DC

## 2020-03-13 MED ORDER — HYDROCORTISONE 1 % EX CREA
TOPICAL_CREAM | Freq: Four times a day (QID) | CUTANEOUS | Status: DC
  Filled 2020-03-13: qty 28

## 2020-03-13 MED ORDER — AMOXICILLIN-POT CLAVULANATE 875-125 MG PO TABS: 1.0000 | ORAL_TABLET | Freq: Two times a day (BID) | ORAL | Status: DC

## 2020-03-13 MED ORDER — LOPERAMIDE HCL 2 MG PO CAPS: 2.0000 mg | ORAL_CAPSULE | Freq: Three times a day (TID) | ORAL | 0 refills | Status: DC | PRN

## 2020-03-13 MED ORDER — RISAQUAD PO CAPS: 2.0000 | ORAL_CAPSULE | Freq: Three times a day (TID) | ORAL | 0 refills | Status: DC

## 2020-03-13 MED ORDER — AMOXICILLIN-POT CLAVULANATE 500-125 MG PO TABS
1.0000 | ORAL_TABLET | Freq: Two times a day (BID) | ORAL | Status: DC
  Filled 2020-03-13: qty 1

## 2020-03-13 MED ORDER — LOSARTAN POTASSIUM 25 MG PO TABS: 25.0000 mg | ORAL_TABLET | Freq: Every day | ORAL | 0 refills | Status: DC

## 2020-03-13 MED ORDER — HYDROCORTISONE 1 % EX CREA: TOPICAL_CREAM | CUTANEOUS | 0 refills | Status: DC

## 2020-03-13 MED ORDER — AMLODIPINE BESYLATE 5 MG PO TABS: 5.0000 mg | ORAL_TABLET | Freq: Every day | ORAL | 0 refills | Status: DC

## 2020-03-13 NOTE — Progress Notes (Signed)
Drinda Butts to be D/C'd [facility] per MD order.  Discussed prescriptions and follow up appointments with the patient. Prescriptions given to patient, medication list explained in detail. Pt verbalized understanding.  Allergies as of 03/13/2020       Reactions   Penicillins Hives, Rash, Other (See Comments)   Rash on hands and blisters on ankles when 86 years old Patient tolerates Zosyn   Bee Venom Palpitations   INSECT BITES/STINGS   Darvon [propoxyphene] Nausea Only, Rash        Medication List     STOP taking these medications    dextromethorphan-guaiFENesin 30-600 MG 12hr tablet Commonly known as: MUCINEX DM   furosemide 20 MG tablet Commonly known as: LASIX   losartan-hydrochlorothiazide 100-25 MG tablet Commonly known as: HYZAAR   metroNIDAZOLE 500 MG tablet Commonly known as: Flagyl   ondansetron 4 MG tablet Commonly known as: ZOFRAN   sulfamethoxazole-trimethoprim 800-160 MG tablet Commonly known as: BACTRIM DS       TAKE these medications    acetaminophen 325 MG tablet Commonly known as: TYLENOL Take 2 tablets (650 mg total) by mouth every 6 (six) hours as needed for mild pain (or Fever >/= 101).   acidophilus Caps capsule Take 2 capsules by mouth 3 (three) times daily.   amLODipine 5 MG tablet Commonly known as: NORVASC Take 1 tablet (5 mg total) by mouth daily.   amoxicillin-clavulanate 500-125 MG tablet Commonly known as: AUGMENTIN Take 1 tablet (500 mg total) by mouth 2 (two) times daily for 7 days.   aspirin EC 81 MG tablet Take 1 tablet (81 mg total) by mouth daily. What changed: when to take this   escitalopram 10 MG tablet Commonly known as: LEXAPRO Take 10 mg by mouth daily.   hydrocortisone cream 1 % Apply up to 4 times a day to hemmorhoids   loperamide 2 MG capsule Commonly known as: IMODIUM Take 1 capsule (2 mg total) by mouth every 8 (eight) hours as needed for diarrhea or loose stools.   losartan 25 MG  tablet Commonly known as: COZAAR Take 1 tablet (25 mg total) by mouth daily.   potassium chloride 10 MEQ tablet Commonly known as: KLOR-CON Take 1 tablet (10 mEq total) by mouth daily. Take when you take the furosemide. What changed:  medication strength how much to take when to take this reasons to take this   torsemide 10 MG tablet Commonly known as: DEMADEX Take 1 tablet (10 mg total) by mouth daily.   traZODone 150 MG tablet Commonly known as: DESYREL Take 150 mg by mouth at bedtime.   triamcinolone 0.025 % cream Commonly known as: KENALOG Apply topically 2 (two) times daily.   Vitamin D-3 25 MCG (1000 UT) Caps Take 1,000 Units by mouth daily.        Vitals:   03/12/20 1939 03/13/20 0510  BP: (!) 143/69 (!) 148/62  Pulse: 63 67  Resp: 16 16  Temp: (!) 97.4 F (36.3 C) (!) 97.4 F (36.3 C)  SpO2: 98% 90%    Skin clean, dry and intact without evidence of skin break down, no evidence of skin tears noted. IV catheter discontinued intact. Site without signs and symptoms of complications. Dressing and pressure applied. Pt denies pain at this time. No complaints noted.  An After Visit Summary was printed and given to the patient. Patient escorted via [place], and D/C home via private auto with daughter.  Trevor Ferguson

## 2020-03-13 NOTE — Care Management Important Message (Signed)
Important Message  Patient Details  Name: Trevor Ferguson MRN: [id number] Date of Birth: Jun 13, 1926   Medicare Important Message Given:  Yes     Dannette Barbara 03/13/2020, 11:26 AM

## 2020-03-13 NOTE — TOC Transition Note (Signed)
Transition of Care [facility]) - CM/SW Discharge Note   Patient Details  Name: Trevor Ferguson MRN: [id number] Date of Birth: 11-17-1925  [facility] CM/SW Contact:  Candie Chroman, LCSW Phone Number: 03/13/2020, 10:00 AM   Clinical Narrative:  Patient has orders to discharge to [facility] today. RN will call report to [phone] (Room 253). Patient's daughter will pick him up and transport him to the facility around 10:30. No further concerns. CSW signing off.   Final next level of care: Skilled Nursing Facility Barriers to Discharge: Barriers Resolved   Patient Goals and CMS Choice     Choice offered to / list presented to : Patient  Discharge Placement   Existing PASRR number confirmed : 03/08/20          Patient chooses bed at: [facility] Patient to be transferred to facility by: Daughter will pick him up around 10:30   Patient and family notified of of transfer: 03/13/20  Discharge Plan and Services     Post Acute Care Choice: [place]                               Social Determinants of Health (SDOH) Interventions     Readmission Risk Interventions Readmission Risk Prevention Plan 07/19/2018  Transportation Screening Complete  PCP or Specialist Appt within 5-7 Days Complete  Home Care Screening Complete  Medication Review (RN CM) Complete  Some recent data might be hidden

## 2020-03-13 NOTE — Discharge Summary (Signed)
[facility] [place] NAME: Trevor Ferguson    MR#:  [id number]  DATE OF BIRTH:  1925-10-13  DATE OF ADMISSION:  03/06/2020 ADMITTING PHYSICIAN: Para Skeans, MD  DATE OF DISCHARGE: 03/13/2020  PRIMARY CARE PHYSICIAN: Juluis Pitch, MD    ADMISSION DIAGNOSIS:  Diverticulitis [K57.92] Abdominal pain, unspecified abdominal location [R10.9]  DISCHARGE DIAGNOSIS:  Active Problems:   COPD (chronic obstructive pulmonary disease) (HCC)   Chronic diastolic CHF (congestive heart failure) (HCC)   Essential hypertension   Acute diverticulitis   Abdominal pain   Acute kidney injury superimposed on CKD (HCC)   Anemia   Depression   Weakness   Rash   Hemorrhoids   SECONDARY DIAGNOSIS:   Past Medical History:  Diagnosis Date  . COPD (chronic obstructive pulmonary disease) ([place])   . Hypertension   . Pneumonia    [id number] hospitalized  . Prostate cancer ([place])    with seed placement  . Sleep apnea    noncompliant, dx 1996  . Stroke ([place]) 2012   no residuals    HOSPITAL COURSE:   1.  Acute diverticulitis of the small bowel.  Was discharged home previously and failed outpatient treatment and was readmitted.  I switch antibiotics over to Zosyn on 03/08/2020.  The patient has been slow to improve but feeling better than when he came in.  We will switch over to Augmentin twice a day for 1 more week.  14 tablets.  Patient having diarrhea.  Continue probiotic.  If the patient fails this therapy then would have to consider surgical management which the patient would like to avoid. 2.  Acute kidney injury on chronic kidney disease stage IIIb.  Last creatinine 1.75.  Recommend checking a BMP within 1 week. 3.  Chronic diastolic congestive heart failure.  No signs of fluid overload.  I did start the patient back on torsemide 10 mg daily with potassium.  No beta-blocker with COPD. 4.  Essential hypertension.  I started back on losartan and gave low-dose  Norvasc.  Also on low-dose torsemide. 5.  Depression on Lexapro and trazodone 6.  Weakness physical therapy recommends rehab 7.  COPD.  Respiratory status stable.  Patient is on nighttime oxygen. 8.  Hemorrhoids.  Steroid cream ordered 9.  Rash on back improved with steroid cream   DISCHARGE CONDITIONS:   Satisfactory  CONSULTS OBTAINED:  Gastroenterology  DRUG ALLERGIES:   Allergies  Allergen Reactions  . Penicillins Hives, Rash and Other (See Comments)    Rash on hands and blisters on ankles when 86 years old Patient tolerates Zosyn  . Bee Venom Palpitations    INSECT BITES/STINGS  . Darvon [Propoxyphene] Nausea Only and Rash    DISCHARGE MEDICATIONS:   Allergies as of 03/13/2020      Reactions   Penicillins Hives, Rash, Other (See Comments)   Rash on hands and blisters on ankles when 86 years old Patient tolerates Zosyn   Bee Venom Palpitations   INSECT BITES/STINGS   Darvon [propoxyphene] Nausea Only, Rash      Medication List    STOP taking these medications   dextromethorphan-guaiFENesin 30-600 MG 12hr tablet Commonly known as: MUCINEX DM   furosemide 20 MG tablet Commonly known as: LASIX   losartan-hydrochlorothiazide 100-25 MG tablet Commonly known as: HYZAAR   metroNIDAZOLE 500 MG tablet Commonly known as: Flagyl   ondansetron 4 MG tablet Commonly known as: ZOFRAN   sulfamethoxazole-trimethoprim 800-160 MG tablet Commonly known as: BACTRIM DS  TAKE these medications   acetaminophen 325 MG tablet Commonly known as: TYLENOL Take 2 tablets (650 mg total) by mouth every 6 (six) hours as needed for mild pain (or Fever >/= 101).   acidophilus Caps capsule Take 2 capsules by mouth 3 (three) times daily.   amLODipine 5 MG tablet Commonly known as: NORVASC Take 1 tablet (5 mg total) by mouth daily.   amoxicillin-clavulanate 500-125 MG tablet Commonly known as: AUGMENTIN Take 1 tablet (500 mg total) by mouth 2 (two) times daily for 7 days.    aspirin EC 81 MG tablet Take 1 tablet (81 mg total) by mouth daily. What changed: when to take this   escitalopram 10 MG tablet Commonly known as: LEXAPRO Take 10 mg by mouth daily.   hydrocortisone cream 1 % Apply up to 4 times a day to hemmorhoids   loperamide 2 MG capsule Commonly known as: IMODIUM Take 1 capsule (2 mg total) by mouth every 8 (eight) hours as needed for diarrhea or loose stools.   losartan 25 MG tablet Commonly known as: COZAAR Take 1 tablet (25 mg total) by mouth daily.   potassium chloride 10 MEQ tablet Commonly known as: KLOR-CON Take 1 tablet (10 mEq total) by mouth daily. Take when you take the furosemide. What changed:  medication strength how much to take when to take this reasons to take this   torsemide 10 MG tablet Commonly known as: DEMADEX Take 1 tablet (10 mg total) by mouth daily.   traZODone 150 MG tablet Commonly known as: DESYREL Take 150 mg by mouth at bedtime.   triamcinolone 0.025 % cream Commonly known as: KENALOG Apply topically 2 (two) times daily.   Vitamin D-3 25 MCG (1000 UT) Caps Take 1,000 Units by mouth daily.        DISCHARGE INSTRUCTIONS:   Follow-up [facility] 1 day  If you experience worsening of your admission symptoms, develop shortness of breath, life threatening emergency, suicidal or homicidal thoughts you must seek medical attention immediately by calling 911 or calling your MD immediately  if symptoms less severe.  You Must read complete instructions/literature along with all the possible adverse reactions/side effects for all the Medicines you take and that have been prescribed to you. Take any new Medicines after you have completely understood and accept all the possible adverse reactions/side effects.   Please note  You were cared for by a hospitalist during your hospital stay. If you have any questions about your discharge medications or the care you received while you were in the hospital  after you are discharged, you can call the unit and asked to speak with the hospitalist on call if the hospitalist that took care of you is not available. Once you are discharged, your primary care physician will handle any further medical issues. Please note that NO REFILLS for any discharge medications will be authorized once you are discharged, as it is imperative that you return to your primary care physician (or establish a relationship with a primary care physician if you do not have one) for your aftercare needs so that they can reassess your need for medications and monitor your lab values.    Today   CHIEF COMPLAINT:   Chief Complaint  Patient presents with  . Abdominal Pain    HISTORY OF PRESENT ILLNESS:  Trevor Ferguson  is a 86 y.o. male with a known history of recent admission with diverticulitis presented back to the hospital with similar complaints  VITAL SIGNS:  Blood pressure (!) 148/62, pulse 67, temperature (!) 97.4 F (36.3 C), temperature source Oral, resp. rate 16, height 5\' 9"  (1.753 m), weight (!) 98.4 kg, SpO2 90 %.  I/O:    Intake/Output Summary (Last 24 hours) at 03/13/2020 0848 Last data filed at 03/13/2020 0436 Gross per 24 hour  Intake 150.08 ml  Output 1275 ml  Net -1124.92 ml    PHYSICAL EXAMINATION:  GENERAL:  86 y.o.-year-old patient lying in the bed with no acute distress.  EYES: Pupils equal, round, reactive to light and accommodation.   HEENT: Head atraumatic, normocephalic. Oropharynx and nasopharynx clear.   LUNGS: Normal breath sounds bilaterally, no wheezing, rales,rhonchi or crepitation. No use of accessory muscles of respiration.  CARDIOVASCULAR: S1, S2 normal. No murmurs, rubs, or gallops.  ABDOMEN: Soft, slight tender to palpation right lower quadrant.  EXTREMITIES: Trace pedal edema.  NEUROLOGIC: Cranial nerves II through XII are intact.  PSYCHIATRIC: The patient is alert and oriented x 3.   DATA REVIEW:   CBC Recent Labs  Lab  03/11/20 0608  WBC 6.3  HGB 10.8*  HCT 30.7*  PLT 150    Chemistries  Recent Labs  Lab 03/11/20 0608  NA 139  K 4.0  CL 105  CO2 24  GLUCOSE 93  BUN 29*  CREATININE 1.75*  CALCIUM 8.4*  AST 14*  ALT 9  ALKPHOS 40  BILITOT 0.8    Microbiology Results  Results for orders placed or performed during the hospital encounter of 03/06/20  SARS Coronavirus 2 by RT PCR (hospital order, performed in [facility] hospital lab) Nasopharyngeal Nasopharyngeal Swab     Status: None   Collection Time: 03/06/20  9:23 PM   Specimen: Nasopharyngeal Swab  Result Value Ref Range Status   SARS Coronavirus 2 NEGATIVE NEGATIVE Final    Comment: (NOTE) SARS-CoV-2 target nucleic acids are NOT DETECTED.  The SARS-CoV-2 RNA is generally detectable in upper and lower respiratory specimens during the acute phase of infection. The lowest concentration of SARS-CoV-2 viral copies this assay can detect is 250 copies / mL. A negative result does not preclude SARS-CoV-2 infection and should not be used as the sole basis for treatment or other patient management decisions.  A negative result may occur with improper specimen collection / handling, submission of specimen other than nasopharyngeal swab, presence of viral mutation(s) within the areas targeted by this assay, and inadequate number of viral copies (<250 copies / mL). A negative result must be combined with clinical observations, patient history, and epidemiological information.  Fact Sheet for Patients:   StrictlyIdeas.no  Fact Sheet for Healthcare Providers: BankingDealers.co.za  This test is not yet approved or  cleared by the [place] FDA and has been authorized for detection and/or diagnosis of SARS-CoV-2 by FDA under an Emergency Use Authorization (EUA).  This EUA will remain in effect (meaning this test can be used) for the duration of the COVID-19 declaration under Section  564(b)(1) of the Act, 21 U.S.C. section 360bbb-3(b)(1), unless the authorization is terminated or revoked sooner.  Performed at [facility], [address] [place], [place] [address]   SARS Coronavirus 2 by RT PCR (hospital order, performed in [facility] hospital lab) Nasopharyngeal Nasopharyngeal Swab     Status: None   Collection Time: 03/12/20  6:02 PM   Specimen: Nasopharyngeal Swab  Result Value Ref Range Status   SARS Coronavirus 2 NEGATIVE NEGATIVE Final    Comment: (NOTE) SARS-CoV-2 target nucleic acids are NOT  DETECTED.  The SARS-CoV-2 RNA is generally detectable in upper and lower respiratory specimens during the acute phase of infection. The lowest concentration of SARS-CoV-2 viral copies this assay can detect is 250 copies / mL. A negative result does not preclude SARS-CoV-2 infection and should not be used as the sole basis for treatment or other patient management decisions.  A negative result may occur with improper specimen collection / handling, submission of specimen other than nasopharyngeal swab, presence of viral mutation(s) within the areas targeted by this assay, and inadequate number of viral copies (<250 copies / mL). A negative result must be combined with clinical observations, patient history, and epidemiological information.  Fact Sheet for Patients:   StrictlyIdeas.no  Fact Sheet for Healthcare Providers: BankingDealers.co.za  This test is not yet approved or  cleared by the [place] FDA and has been authorized for detection and/or diagnosis of SARS-CoV-2 by FDA under an Emergency Use Authorization (EUA).  This EUA will remain in effect (meaning this test can be used) for the duration of the COVID-19 declaration under Section 564(b)(1) of the Act, 21 U.S.C. section 360bbb-3(b)(1), unless the authorization is terminated or revoked sooner.  Performed at [facility], [address] [place], [place] [address]       Management plans discussed with the patient, family and they are in agreement.  CODE STATUS:     Code Status Orders  (From admission, onward)         Start     Ordered   03/06/20 2233  Do not attempt resuscitation (DNR)  Continuous       Question Answer Comment  In the event of cardiac or respiratory ARREST Do not call a "code blue"   In the event of cardiac or respiratory ARREST Do not perform Intubation, CPR, defibrillation or ACLS   In the event of cardiac or respiratory ARREST Use medication by any route, position, wound care, and other measures to relive pain and suffering. May use oxygen, suction and manual treatment of airway obstruction as needed for comfort.      03/06/20 2232        Code Status History    Date Active Date Inactive Code Status Order ID Comments User Context   03/06/2020 2232 03/06/2020 2232 Full Code [id number]  Para Skeans, MD ED   03/01/2020 2154 03/04/2020 2008 DNR [id number]  Sidney Ace, Arvella Merles, MD ED   07/17/2018 2112 07/19/2018 1735 DNR [id number]  Demetrios Loll, MD Inpatient   Advance Care Planning Activity    Advance Directive Documentation     Most Recent Value  Type of Advance Directive Healthcare Power of Attorney, Living will, Out of facility DNR (pink MOST or yellow form)  Pre-existing out of facility DNR order (yellow form or pink MOST form) --  "MOST" Form in Place? --      TOTAL TIME TAKING CARE OF THIS PATIENT: 35 minutes.    Loletha Grayer M.D on 03/13/2020 at 8:48 AM  Between 7am to 6pm - Pager - [phone]  After 6pm go to [email or web address] - password [email or web address]  [facility]  CC: Primary care physician; Juluis Pitch, MD

## 2020-03-14 DIAGNOSIS — I5032 Chronic diastolic (congestive) heart failure: Secondary | ICD-10-CM | POA: Diagnosis not present

## 2020-03-14 DIAGNOSIS — K5792 Diverticulitis of intestine, part unspecified, without perforation or abscess without bleeding: Secondary | ICD-10-CM | POA: Diagnosis not present

## 2020-03-14 DIAGNOSIS — N1832 Chronic kidney disease, stage 3b: Secondary | ICD-10-CM

## 2020-03-14 DIAGNOSIS — J9611 Chronic respiratory failure with hypoxia: Secondary | ICD-10-CM

## 2020-03-14 DIAGNOSIS — F3341 Major depressive disorder, recurrent, in partial remission: Secondary | ICD-10-CM

## 2020-03-14 DIAGNOSIS — J439 Emphysema, unspecified: Secondary | ICD-10-CM

## 2020-04-18 ENCOUNTER — Telehealth: Payer: Self-pay | Admitting: Cardiovascular Disease

## 2020-04-18 NOTE — Telephone Encounter (Signed)
Pt c/o medication issue:  1. Name of Medication: ASA  2. How are you currently taking this medication (dosage and times per day)? Wants to confirm dose  3. Are you having a reaction (difficulty breathing--STAT)? no  4. What is your medication issue? Patient not sure if taking 1 or 2 per day

## 2020-04-18 NOTE — Telephone Encounter (Signed)
Called patient back and clarified ASA dose. Patient is to take ASA 81mg  QD. Patient verbalized understanding and agreed with plan.

## 2020-04-24 NOTE — Progress Notes (Signed)
Cardiology Office Note  Date:  04/25/2020   ID:  Drinda Butts, DOB [birth year], MRN [id number]  PCP:  Juluis Pitch, MD   Chief Complaint  Patient presents with  . other    6 month follow up. Meds reviewed by the pt. verbally. Pt. c/o LE edema and shortness of breath.     HPI:  Trevor Ferguson is a 86 yo pleasant white male with  Coronary artery disease, mild three-vessel seen on CT scan chest 2014 Mild COPD with chronic bronchitis with DOE, Smoked 45 years sleep apnea on BiPAP,  hypertension,  stroke,   chronic diastolic heart failure,  prior echocardiogram with mild to moderately elevated right heart pressures in August 2016, normal ejection fraction.   carotid ultrasound with minimal carotid disease History of medication confusion He presents today for routine follow-up of his shortness of breath, CAD  Long discussion concerning recent blood pressure measurements BP 120 to 140,  He is concerned about low diastolic pressure running 40-60 Rate 70 to 80  Wife in health care, he lives by himself He spends Am and PM  Sedentary , working with PT  Oxygen at night, sometimes in day  Labs: in 02/2020 reviewed in detail CR 1.74 On 04/04/2020 CR up to 2.1, BUN 53/does not feel he was dehydrated Recheck pending. Continues on torsemide every other day  Trace ankle swelling  EKG personally reviewed by myself on todays visit Shows normal sinus rhythm with rate 68 bpm no significant ST-T wave changes  Her past medical history reviewed CT 09/2018 Nodules, COPD  Previously seen by pulmonary in [facility],  Echocardiogram September 04, 2018, Performed for shortness of breath Normal LV function ejection fraction 60%, normal RV size and function, mildly elevated right heart pressures  shortness of breath ,cough admitted from 11/29 to 12/1 with a primary diagnosis of pneumonia.   Discharged on oxygen,  oral prednisone taper and empiric Levaquin for a few days. Lab work done showing  sodium 128, creatinine 1.53 BUN 34 hemoglobin 9.5 BNP 204 EKG normal sinus rhythm Chest x-ray with mild cardiomegaly and worsening interstitial pulmonary edema  CT chest 2014,  Mild to moderate CAD, 3 vessel,aorta atherosclerosis   PMH:   has a past medical history of COPD (chronic obstructive pulmonary disease) ([place]), Hypertension, Pneumonia, Prostate cancer ([place]), Sleep apnea, and Stroke ([place]) (2012).  PSH:    Past Surgical History:  Procedure Laterality Date  . adenoid    . APPENDECTOMY    . HEMORRHOID SURGERY    . spine cyst    . TONSILLECTOMY      Current Outpatient Medications  Medication Sig Dispense Refill  . acetaminophen (TYLENOL) 325 MG tablet Take 2 tablets (650 mg total) by mouth every 6 (six) hours as needed for mild pain (or Fever >/= 101).    Marland Kitchen acidophilus (RISAQUAD) CAPS capsule Take 2 capsules by mouth 3 (three) times daily. 180 capsule 0  . albuterol (ACCUNEB) 0.63 MG/3ML nebulizer solution TAKE 1 VIAL(3MLS) BY NEBULIZATION EVERY 4 HOURS AS NEEDED    . aspirin EC 81 MG tablet Take 81 mg by mouth daily. Swallow whole.    . budesonide (PULMICORT) 0.5 MG/2ML nebulizer solution Inhale into the lungs.    . Cholecalciferol (VITAMIN D-3) 25 MCG (1000 UT) CAPS Take 1,000 Units by mouth daily.     Marland Kitchen escitalopram (LEXAPRO) 10 MG tablet Take 10 mg by mouth daily.     . hydrocortisone cream 1 % Apply up to 4 times a day to  hemmorhoids 30 g 0  . loperamide (IMODIUM) 2 MG capsule Take 1 capsule (2 mg total) by mouth every 8 (eight) hours as needed for diarrhea or loose stools. 20 capsule 0  . losartan-hydrochlorothiazide (HYZAAR) 100-25 MG tablet Take 0.5 tablets by mouth daily.    . potassium chloride SA (KLOR-CON) 10 MEQ tablet Take 1 tablet (10 mEq total) by mouth daily. Take when you take the furosemide. (Patient taking differently: Take 10 mEq by mouth every other day. Take when you take the torsemide) 30 tablet 0  . torsemide (DEMADEX) 10 MG tablet Take 1 tablet (10 mg  total) by mouth daily. (Patient taking differently: Take 10 mg by mouth every other day. ) 30 tablet 0  . traZODone (DESYREL) 150 MG tablet Take 150 mg by mouth at bedtime.    . triamcinolone (KENALOG) 0.025 % cream Apply topically 2 (two) times daily.     No current facility-administered medications for this visit.    Allergies:   Penicillins, Bee venom, and Darvon [propoxyphene]   Social History:  The patient  reports that he quit smoking about 34 years ago. His smoking use included cigarettes. He quit after 40.00 years of use. He has never used smokeless tobacco. He reports current alcohol use of about 14.0 standard drinks of alcohol per week. He reports that he does not use drugs.   Family History:   family history includes Asthma in his sister; COPD in his sister; Congestive Heart Failure in his mother; Stroke in his maternal grandfather; Tuberculosis in his maternal grandmother.    Review of Systems: Review of Systems  Constitutional: Negative.   Respiratory: Positive for shortness of breath.   Cardiovascular: Positive for leg swelling.  Gastrointestinal: Negative.   Musculoskeletal: Negative.   Neurological: Negative.   Psychiatric/Behavioral: Negative.   All other systems reviewed and are negative.   PHYSICAL EXAM: VS:  BP (!) 142/60 (BP Location: Left Arm, Patient Position: Sitting, Cuff Size: Normal)   Pulse 63   Ht 5\' 9"  (1.753 m)   Wt 202 lb (91.6 kg)   SpO2 97%   BMI 29.83 kg/m  , BMI Body mass index is 29.83 kg/m. Constitutional:  oriented to person, place, and time. No distress.  HENT:  Head: Grossly normal Eyes:  no discharge. No scleral icterus.  Neck: No JVD, no carotid bruits  Cardiovascular: Regular rate and rhythm, no murmurs appreciated Trace ankle swelling bilaterally Pulmonary/Chest: Clear to auscultation bilaterally, no wheezes or rails Abdominal: Soft.  no distension.  no tenderness.  Musculoskeletal: Normal range of motion Neurological:  normal  muscle tone. Coordination normal. No atrophy Skin: Skin warm and dry Psychiatric: normal affect, pleasant   Recent Labs: 03/06/2020: B Natriuretic Peptide 445.3; TSH 1.355 03/11/2020: ALT 9; BUN 29; Creatinine, Ser 1.75; Hemoglobin 10.8; Platelets 150; Potassium 4.0; Sodium 139    Lipid Panel No results found for: CHOL, HDL, LDLCALC, TRIG    Wt Readings from Last 3 Encounters:  04/25/20 202 lb (91.6 kg)  03/12/20 (!) 216 lb 14.9 oz (98.4 kg)  03/01/20 210 lb (95.3 kg)      ASSESSMENT AND PLAN:  Chronic obstructive pulmonary disease, unspecified COPD type (HCC) - Smoked 45 years, chronic shortness of breath on exertion On oxygen at night and as needed Managed by pulmonary in [place] On inhalers -On torsemide 10 every other day -Discussed with him in detail, he appears euvolemic, for any worsening renal dysfunction may need to decrease torsemide to every third day  Essential hypertension -  Blood pressure is well controlled on today's visit. No changes made to the medications.  Stable  Chronic diastolic CHF (congestive heart failure) (HCC) -  As above appears euvolemic, may need less torsemide if GFR continues to run low  Pulmonary hypertension Noted on previous echocardiograms Stable respiratory status, now balancing with worsening renal dysfunction Takes torsemide 10 every other day Shortness of breath secondary to lung disease, pulmonary hypertension, deconditioning, obesity, anemia Recommended weight loss, walking program  Anemia Stable numbers, hemoglobin 10.8  Leg swelling - Plan: EKG 12-Lead Minimal/trace ankle swelling  Sleep apnea, unspecified type - Plan: EKG 12-Lead Unable to tolerate BiPAP  CAD:  seen on CT scan 2014 Denies anginal symptoms, no further work-up at this time  Adjustment disorder Handling wife who is in nursing center, he visits daily   Total encounter time more than 25 minutes  Greater than 50% was spent in counseling and coordination  of care with the patient    No orders of the defined types were placed in this encounter.    Signed, Esmond Plants, M.D., Ph.D. 04/25/2020  [place], [place]

## 2020-04-25 ENCOUNTER — Encounter: Payer: Self-pay | Admitting: Cardiovascular Disease

## 2020-04-25 ENCOUNTER — Other Ambulatory Visit: Payer: Self-pay

## 2020-04-25 ENCOUNTER — Ambulatory Visit (INDEPENDENT_AMBULATORY_CARE_PROVIDER_SITE_OTHER): Payer: Medicare PPO | Admitting: Cardiovascular Disease

## 2020-04-25 VITALS — BP 142/60 | HR 63 | Ht 69.0 in | Wt 202.0 lb

## 2020-04-25 DIAGNOSIS — J449 Chronic obstructive pulmonary disease, unspecified: Secondary | ICD-10-CM

## 2020-04-25 DIAGNOSIS — I10 Essential (primary) hypertension: Secondary | ICD-10-CM | POA: Diagnosis not present

## 2020-04-25 DIAGNOSIS — I5032 Chronic diastolic (congestive) heart failure: Secondary | ICD-10-CM | POA: Diagnosis not present

## 2020-04-25 DIAGNOSIS — R0602 Shortness of breath: Secondary | ICD-10-CM

## 2020-04-25 NOTE — Patient Instructions (Signed)
Medication Instructions:  No changes  If you need a refill on your cardiac medications before your next appointment, please call your pharmacy.    Lab work: No new labs needed   If you have labs (blood work) drawn today and your tests are completely normal, you will receive your results only by: . MyChart Message (if you have MyChart) OR . A paper copy in the mail If you have any lab test that is abnormal or we need to change your treatment, we will call you to review the results.   Testing/Procedures: No new testing needed   Follow-Up: At [facility], you and your health needs are our priority.  As part of our continuing mission to provide you with exceptional heart care, we have created designated Provider Care Teams.  These Care Teams include your primary Cardiologist (physician) and Advanced Practice Providers (APPs -  Physician Assistants and Nurse Practitioners) who all work together to provide you with the care you need, when you need it.  . You will need a follow up appointment in 6 months  . Providers on your designated Care Team:   . Christopher Berge, NP . Ryan Dunn, PA-C . Jacquelyn Visser, PA-C  Any Other Special Instructions Will Be Listed Below (If Applicable).  COVID-19 Vaccine Information can be found at: [email or web address] For questions related to vaccine distribution or appointments, please email [email or web address] or call [phone].     

## 2020-10-22 NOTE — Progress Notes (Signed)
Cardiology Office Note  Date:  10/23/2020   ID:  DWAN HEMMELGARN, DOB 10/22/1925, MRN [id number]  PCP:  Juluis Pitch, MD   Chief Complaint  Patient presents with   Other    6 month f/u no complaints today. Meds reviewed verbally with pt.    HPI:  [name] is a 87 yo pleasant white male with  Coronary artery disease, mild three-vessel seen on CT scan chest 2014 Mild COPD with chronic bronchitis with DOE, Smoked 45 years sleep apnea on BiPAP,  hypertension,  stroke,   chronic diastolic heart failure,  prior echocardiogram with mild to moderately elevated right heart pressures in August 2016, normal ejection fraction.   carotid ultrasound with minimal carotid disease History of medication confusion He presents today for routine follow-up of his shortness of breath, CAD  Last office visit September 2021  Using the [place] at times, legs weak  BP at home 751 systolic Blood pressure numbers well controlled here today Sedentary, no regular exercise program  Wife in health care, he lives by himself Has his own ADLs, food  Need living facility may be in the next year He spends lots of time with her  Oxygen at night, sometimes in day  Labs: in 09/2020 CR up to 2.1  no ankle swelling  EKG personally reviewed by myself on todays visit Shows normal sinus rhythm with rate 75 bpm no significant ST-T wave changes, PAC  Her past medical history reviewed CT 09/2018 Nodules, COPD  Previously seen by pulmonary in [facility],  Echocardiogram September 04, 2018, Performed for shortness of breath Normal LV function ejection fraction 60%, normal RV size and function, mildly elevated right heart pressures  shortness of breath ,cough admitted from 11/29 to 12/1 with a primary diagnosis of pneumonia.   Discharged on oxygen,  oral prednisone taper and empiric Levaquin for a few days. Lab work done showing sodium 128, creatinine 1.53 BUN 34 hemoglobin 9.5 BNP 204 EKG normal sinus  rhythm Chest x-ray with mild cardiomegaly and worsening interstitial pulmonary edema  CT chest 2014,  Mild to moderate CAD, 3 vessel,aorta atherosclerosis   PMH:   has a past medical history of COPD (chronic obstructive pulmonary disease) ([place]), Hypertension, Pneumonia, Prostate cancer ([place]), Sleep apnea, and Stroke ([place]) (2012).  PSH:    Past Surgical History:  Procedure Laterality Date   adenoid     APPENDECTOMY     HEMORRHOID SURGERY     spine cyst     TONSILLECTOMY      Current Outpatient Medications  Medication Sig Dispense Refill   acetaminophen (TYLENOL) 325 MG tablet Take 2 tablets (650 mg total) by mouth every 6 (six) hours as needed for mild pain (or Fever >/= 101).     acidophilus (RISAQUAD) CAPS capsule Take 2 capsules by mouth 3 (three) times daily. 180 capsule 0   albuterol (ACCUNEB) 0.63 MG/3ML nebulizer solution 3 (three) times daily as needed.     amLODipine (NORVASC) 5 MG tablet Take 5 mg by mouth daily.     aspirin EC 81 MG tablet Take 81 mg by mouth daily. Swallow whole.     budesonide (PULMICORT) 0.5 MG/2ML nebulizer solution Inhale into the lungs.     Cholecalciferol (VITAMIN D-3) 25 MCG (1000 UT) CAPS Take 1,000 Units by mouth daily.      escitalopram (LEXAPRO) 10 MG tablet Take 10 mg by mouth daily.      Ferrous Sulfate (IRON PO) Take by mouth daily.     hydrocortisone  cream 1 % Apply up to 4 times a day to hemmorhoids 30 g 0   loperamide (IMODIUM) 2 MG capsule Take 1 capsule (2 mg total) by mouth every 8 (eight) hours as needed for diarrhea or loose stools. 20 capsule 0   losartan-hydrochlorothiazide (HYZAAR) 100-25 MG tablet Take 0.5 tablets by mouth daily.     potassium chloride (MICRO-K) 10 MEQ CR capsule Take 10 mEq by mouth daily as needed. Takes when taking Torsemide.     torsemide (DEMADEX) 10 MG tablet Take 10 mg by mouth daily as needed.     traZODone (DESYREL) 150 MG tablet Take 150 mg by mouth at bedtime.     triamcinolone  (KENALOG) 0.025 % cream Apply topically 2 (two) times daily.     No current facility-administered medications for this visit.    Allergies:   Penicillins, Bee venom, and Darvon [propoxyphene]   Social History:  The patient  reports that he quit smoking about 35 years ago. His smoking use included cigarettes. He quit after 40.00 years of use. He has never used smokeless tobacco. He reports current alcohol use of about 14.0 standard drinks of alcohol per week. He reports that he does not use drugs.   Family History:   family history includes Asthma in his sister; COPD in his sister; Congestive Heart Failure in his mother; Stroke in his maternal grandfather; Tuberculosis in his maternal grandmother.    Review of Systems: Review of Systems  Constitutional: Negative.   Respiratory: Positive for shortness of breath.   Cardiovascular: Positive for leg swelling.  Gastrointestinal: Negative.   Musculoskeletal: Negative.   Neurological: Negative.   Psychiatric/Behavioral: Negative.   All other systems reviewed and are negative.   PHYSICAL EXAM: VS:  BP 120/60 (BP Location: Left Arm, Patient Position: Sitting, Cuff Size: Normal)    Pulse 75    Ht 5\' 8"  (1.727 m)    Wt 204 lb 6 oz (92.7 kg)    SpO2 96%    BMI 31.08 kg/m  , BMI Body mass index is 31.08 kg/m. Constitutional:  oriented to person, place, and time. No distress.  HENT:  Head: Grossly normal Eyes:  no discharge. No scleral icterus.  Neck: No JVD, no carotid bruits  Cardiovascular: Regular rate and rhythm, no murmurs appreciated Trace ankle swelling bilaterally Pulmonary/Chest: Clear to auscultation bilaterally, no wheezes or rails Abdominal: Soft.  no distension.  no tenderness.  Musculoskeletal: Normal range of motion Neurological:  normal muscle tone. Coordination normal. No atrophy Skin: Skin warm and dry Psychiatric: normal affect, pleasant  Recent Labs: 03/06/2020: B Natriuretic Peptide 445.3; TSH 1.355 03/11/2020: ALT 9;  BUN 29; Creatinine, Ser 1.75; Hemoglobin 10.8; Platelets 150; Potassium 4.0; Sodium 139    Lipid Panel No results found for: CHOL, HDL, LDLCALC, TRIG    Wt Readings from Last 3 Encounters:  10/23/20 204 lb 6 oz (92.7 kg)  04/25/20 202 lb (91.6 kg)  03/12/20 (!) 216 lb 14.9 oz (98.4 kg)     ASSESSMENT AND PLAN:  Chronic obstructive pulmonary disease, unspecified COPD type (HCC) - Smoked 45 years, chronic shortness of breath on exertion On oxygen at night and as needed Managed by pulmonary in [place] On inhalers stabale off torsemide  Essential hypertension   Blood pressure is well controlled on today's visit. No changes made to the medications.  Chronic diastolic CHF (congestive heart failure) (HCC) -  Euvolemic, off torsemide  Pulmonary hypertension Shortness of breath secondary to lung disease, pulmonary hypertension, deconditioning, obesity, anemia  Suggested walking program  Anemia Stable numbers, reviewed  Leg swelling - Plan: EKG 12-Lead Minimal ankle swelling  Sleep apnea, unspecified type - Plan: EKG 12-Lead Unable to tolerate BiPAP  CAD: seen on CT scan 2014 Denies anginal symptoms, no further work-up at this time  Adjustment disorder Stable, doing well   Total encounter time more than 25 minutes  Greater than 50% was spent in counseling and coordination of care with the patient    Orders Placed This Encounter  Procedures   EKG 12-Lead     Signed, Esmond Plants, M.D., Ph.D. 10/23/2020  [place], [place]

## 2020-10-23 ENCOUNTER — Encounter: Payer: Self-pay | Admitting: Cardiovascular Disease

## 2020-10-23 ENCOUNTER — Other Ambulatory Visit: Payer: Self-pay

## 2020-10-23 ENCOUNTER — Ambulatory Visit (INDEPENDENT_AMBULATORY_CARE_PROVIDER_SITE_OTHER): Payer: Medicare PPO | Admitting: Cardiovascular Disease

## 2020-10-23 ENCOUNTER — Telehealth: Payer: Self-pay | Admitting: Cardiovascular Disease

## 2020-10-23 VITALS — BP 120/60 | HR 75 | Ht 68.0 in | Wt 204.4 lb

## 2020-10-23 DIAGNOSIS — I5032 Chronic diastolic (congestive) heart failure: Secondary | ICD-10-CM | POA: Diagnosis not present

## 2020-10-23 DIAGNOSIS — J449 Chronic obstructive pulmonary disease, unspecified: Secondary | ICD-10-CM | POA: Diagnosis not present

## 2020-10-23 DIAGNOSIS — R0602 Shortness of breath: Secondary | ICD-10-CM | POA: Diagnosis not present

## 2020-10-23 DIAGNOSIS — Z13220 Encounter for screening for lipoid disorders: Secondary | ICD-10-CM

## 2020-10-23 DIAGNOSIS — G4730 Sleep apnea, unspecified: Secondary | ICD-10-CM

## 2020-10-23 DIAGNOSIS — I10 Essential (primary) hypertension: Secondary | ICD-10-CM | POA: Diagnosis not present

## 2020-10-23 NOTE — Patient Instructions (Addendum)
Medication Instructions:  No changes  Lab work: Lipid panel , PLEASE FAST the day you plan on getting labs drawn Walk into medical mall at the check in desk, they will direct you to lab registration, hours for labs are Monday-Friday 07:00am-5:30pm (no appointment necessary)  Testing/Procedures: No new testing needed   Follow-Up:  . You will need a follow up appointment in 12 months  . Providers on your designated Care Team:   . Murray Hodgkins, NP . Christell Faith, PA-C . Marrianne Mood, PA-C    COVID-19 Vaccine Information can be found at: ShippingScam.co.uk For questions related to vaccine distribution or appointments, please email vaccine@ .com or call [phone].

## 2020-10-23 NOTE — Telephone Encounter (Signed)
Patient calling in requesting that we send his needed lab work request to [facility] on [address]. Patient is going there on 3/17 for lab work and wants to do both at that time  Please advise

## 2020-10-24 NOTE — Telephone Encounter (Signed)
Trevor Ferguson seen yesterday in clinic, Dr. Rockey Situ order fasting lipids at pt's convince. [name] called and would like labs sent to [facility] since he will be having labs at his appt on 3/17. Lab rec printed and faxed to [place].

## 2020-11-03 NOTE — Telephone Encounter (Signed)
Spoke with patient and he confirmed he would like labs to be done at the [facility] entrance. Order entered for that and reviewed he should not eat or drink anything after midnight except sip of water with his medications. He apologized for any confusion and verbalized understanding with no further questions at this time.

## 2020-11-03 NOTE — Addendum Note (Signed)
Addended by: Valora Corporal on: 11/03/2020 02:11 PM   Modules accepted: Orders

## 2020-11-03 NOTE — Telephone Encounter (Signed)
Patient did not get labs at [facility] .  He would like the order changed back to [facility].   Patient will go Monday .

## 2020-11-07 ENCOUNTER — Other Ambulatory Visit
Admission: RE | Admit: 2020-11-07 | Discharge: 2020-11-07 | Disposition: A | Discharge status: Home / Self Care | Payer: Medicare PPO | Attending: Cardiovascular Disease | Admitting: Cardiovascular Disease

## 2020-11-07 ENCOUNTER — Other Ambulatory Visit: Payer: Self-pay

## 2020-11-07 DIAGNOSIS — Z13220 Encounter for screening for lipoid disorders: Secondary | ICD-10-CM | POA: Diagnosis present

## 2020-11-07 LAB — LIPID PANEL
Cholesterol: 120 mg/dL (ref 0–200)
HDL: 61 mg/dL (ref >40)
LDL Cholesterol: 52 mg/dL (ref 0–99)
Total CHOL/HDL Ratio: 2 RATIO
Triglycerides: 37 mg/dL (ref <150)
VLDL: 7 mg/dL (ref 0–40)

## 2020-11-29 ENCOUNTER — Other Ambulatory Visit: Payer: Self-pay | Admitting: Gastroenterology

## 2020-11-29 DIAGNOSIS — R053 Chronic cough: Secondary | ICD-10-CM

## 2020-11-29 DIAGNOSIS — R933 Abnormal findings on diagnostic imaging of other parts of digestive tract: Secondary | ICD-10-CM

## 2020-11-29 DIAGNOSIS — R1313 Dysphagia, pharyngeal phase: Secondary | ICD-10-CM

## 2021-01-08 DIAGNOSIS — I129 Hypertensive chronic kidney disease with stage 1 through stage 4 chronic kidney disease, or unspecified chronic kidney disease: Secondary | ICD-10-CM | POA: Insufficient documentation

## 2021-01-08 DIAGNOSIS — N184 Chronic kidney disease, stage 4 (severe): Secondary | ICD-10-CM

## 2021-01-08 DIAGNOSIS — N2581 Secondary hyperparathyroidism of renal origin: Secondary | ICD-10-CM | POA: Insufficient documentation

## 2021-01-26 ENCOUNTER — Inpatient Hospital Stay
Admission: EM | Admit: 2021-01-26 | Discharge: 2021-02-02 | DRG: 378 | Disposition: A | Discharge status: Home / Self Care | Payer: Medicare PPO | Attending: Internal Medicine | Admitting: Internal Medicine

## 2021-01-26 ENCOUNTER — Encounter: Payer: Self-pay | Admitting: Emergency Medicine

## 2021-01-26 ENCOUNTER — Other Ambulatory Visit: Payer: Self-pay

## 2021-01-26 DIAGNOSIS — J441 Chronic obstructive pulmonary disease with (acute) exacerbation: Secondary | ICD-10-CM | POA: Diagnosis present

## 2021-01-26 DIAGNOSIS — G4700 Insomnia, unspecified: Secondary | ICD-10-CM | POA: Diagnosis present

## 2021-01-26 DIAGNOSIS — K922 Gastrointestinal hemorrhage, unspecified: Secondary | ICD-10-CM | POA: Diagnosis not present

## 2021-01-26 DIAGNOSIS — N1832 Chronic kidney disease, stage 3b: Secondary | ICD-10-CM | POA: Diagnosis present

## 2021-01-26 DIAGNOSIS — G4733 Obstructive sleep apnea (adult) (pediatric): Secondary | ICD-10-CM | POA: Diagnosis present

## 2021-01-26 DIAGNOSIS — Z825 Family history of asthma and other chronic lower respiratory diseases: Secondary | ICD-10-CM

## 2021-01-26 DIAGNOSIS — Z9981 Dependence on supplemental oxygen: Secondary | ICD-10-CM

## 2021-01-26 DIAGNOSIS — F32A Depression, unspecified: Secondary | ICD-10-CM | POA: Diagnosis present

## 2021-01-26 DIAGNOSIS — K5711 Diverticulosis of small intestine without perforation or abscess with bleeding: Secondary | ICD-10-CM | POA: Diagnosis present

## 2021-01-26 DIAGNOSIS — I10 Essential (primary) hypertension: Secondary | ICD-10-CM | POA: Diagnosis present

## 2021-01-26 DIAGNOSIS — J449 Chronic obstructive pulmonary disease, unspecified: Secondary | ICD-10-CM | POA: Diagnosis present

## 2021-01-26 DIAGNOSIS — Z20822 Contact with and (suspected) exposure to covid-19: Secondary | ICD-10-CM | POA: Diagnosis present

## 2021-01-26 DIAGNOSIS — K641 Second degree hemorrhoids: Secondary | ICD-10-CM | POA: Diagnosis present

## 2021-01-26 DIAGNOSIS — Z823 Family history of stroke: Secondary | ICD-10-CM | POA: Diagnosis not present

## 2021-01-26 DIAGNOSIS — Z8249 Family history of ischemic heart disease and other diseases of the circulatory system: Secondary | ICD-10-CM

## 2021-01-26 DIAGNOSIS — D631 Anemia in chronic kidney disease: Secondary | ICD-10-CM | POA: Diagnosis present

## 2021-01-26 DIAGNOSIS — K625 Hemorrhage of anus and rectum: Secondary | ICD-10-CM | POA: Diagnosis present

## 2021-01-26 DIAGNOSIS — Z66 Do not resuscitate: Secondary | ICD-10-CM | POA: Diagnosis present

## 2021-01-26 DIAGNOSIS — Z880 Allergy status to penicillin: Secondary | ICD-10-CM

## 2021-01-26 DIAGNOSIS — Z7951 Long term (current) use of inhaled steroids: Secondary | ICD-10-CM

## 2021-01-26 DIAGNOSIS — Z7982 Long term (current) use of aspirin: Secondary | ICD-10-CM | POA: Diagnosis not present

## 2021-01-26 DIAGNOSIS — Z8673 Personal history of transient ischemic attack (TIA), and cerebral infarction without residual deficits: Secondary | ICD-10-CM

## 2021-01-26 DIAGNOSIS — Z79899 Other long term (current) drug therapy: Secondary | ICD-10-CM

## 2021-01-26 DIAGNOSIS — I130 Hypertensive heart and chronic kidney disease with heart failure and stage 1 through stage 4 chronic kidney disease, or unspecified chronic kidney disease: Secondary | ICD-10-CM | POA: Diagnosis present

## 2021-01-26 DIAGNOSIS — K921 Melena: Secondary | ICD-10-CM | POA: Diagnosis not present

## 2021-01-26 DIAGNOSIS — Z8719 Personal history of other diseases of the digestive system: Secondary | ICD-10-CM

## 2021-01-26 DIAGNOSIS — K283 Acute gastrojejunal ulcer without hemorrhage or perforation: Secondary | ICD-10-CM | POA: Diagnosis not present

## 2021-01-26 DIAGNOSIS — E669 Obesity, unspecified: Secondary | ICD-10-CM | POA: Diagnosis present

## 2021-01-26 DIAGNOSIS — D62 Acute posthemorrhagic anemia: Secondary | ICD-10-CM | POA: Diagnosis present

## 2021-01-26 DIAGNOSIS — Z885 Allergy status to narcotic agent status: Secondary | ICD-10-CM

## 2021-01-26 DIAGNOSIS — N184 Chronic kidney disease, stage 4 (severe): Secondary | ICD-10-CM

## 2021-01-26 DIAGNOSIS — I5032 Chronic diastolic (congestive) heart failure: Secondary | ICD-10-CM | POA: Diagnosis present

## 2021-01-26 DIAGNOSIS — K635 Polyp of colon: Secondary | ICD-10-CM

## 2021-01-26 DIAGNOSIS — D72829 Elevated white blood cell count, unspecified: Secondary | ICD-10-CM | POA: Diagnosis present

## 2021-01-26 DIAGNOSIS — Z87891 Personal history of nicotine dependence: Secondary | ICD-10-CM

## 2021-01-26 DIAGNOSIS — J9611 Chronic respiratory failure with hypoxia: Secondary | ICD-10-CM | POA: Diagnosis present

## 2021-01-26 DIAGNOSIS — Z6831 Body mass index (BMI) 31.0-31.9, adult: Secondary | ICD-10-CM

## 2021-01-26 DIAGNOSIS — K449 Diaphragmatic hernia without obstruction or gangrene: Secondary | ICD-10-CM | POA: Diagnosis present

## 2021-01-26 DIAGNOSIS — K284 Chronic or unspecified gastrojejunal ulcer with hemorrhage: Secondary | ICD-10-CM | POA: Diagnosis present

## 2021-01-26 DIAGNOSIS — R159 Full incontinence of feces: Secondary | ICD-10-CM | POA: Diagnosis present

## 2021-01-26 DIAGNOSIS — R933 Abnormal findings on diagnostic imaging of other parts of digestive tract: Secondary | ICD-10-CM | POA: Diagnosis not present

## 2021-01-26 HISTORY — DX: Gastrointestinal hemorrhage, unspecified: K92.2

## 2021-01-26 LAB — COMPREHENSIVE METABOLIC PANEL
ALT: 15 U/L (ref 0–44)
AST: 18 U/L (ref 15–41)
Albumin: 3.7 g/dL (ref 3.5–5.0)
Alkaline Phosphatase: 47 U/L (ref 38–126)
Anion gap: 10 (ref 5–15)
BUN: 60 mg/dL — ABNORMAL HIGH (ref 8–23)
CO2: 23 mmol/L (ref 22–32)
Calcium: 8.6 mg/dL — ABNORMAL LOW (ref 8.9–10.3)
Chloride: 102 mmol/L (ref 98–111)
Creatinine, Ser: 1.91 mg/dL — ABNORMAL HIGH (ref 0.61–1.24)
GFR, Estimated: 32 mL/min — ABNORMAL LOW (ref >60)
Glucose, Bld: 152 mg/dL — ABNORMAL HIGH (ref 70–99)
Potassium: 4.2 mmol/L (ref 3.5–5.1)
Sodium: 135 mmol/L (ref 135–145)
Total Bilirubin: 0.7 mg/dL (ref 0.3–1.2)
Total Protein: 6.1 g/dL — ABNORMAL LOW (ref 6.5–8.1)

## 2021-01-26 LAB — CBC
HCT: 25.5 % — ABNORMAL LOW (ref 39.0–52.0)
Hemoglobin: 9 g/dL — ABNORMAL LOW (ref 13.0–17.0)
MCH: 32.1 pg (ref 26.0–34.0)
MCHC: 35.3 g/dL (ref 30.0–36.0)
MCV: 91.1 fL (ref 80.0–100.0)
Platelets: 198 10*3/uL (ref 150–400)
RBC: 2.8 MIL/uL — ABNORMAL LOW (ref 4.22–5.81)
RDW: 14.6 % (ref 11.5–15.5)
WBC: 15.6 10*3/uL — ABNORMAL HIGH (ref 4.0–10.5)
nRBC: 0 % (ref 0.0–0.2)

## 2021-01-26 LAB — RESP PANEL BY RT-PCR (FLU A&B, COVID) ARPGX2
Influenza A by PCR: NEGATIVE
Influenza B by PCR: NEGATIVE
SARS Coronavirus 2 by RT PCR: NEGATIVE

## 2021-01-26 MED ORDER — LOSARTAN POTASSIUM 50 MG PO TABS
100.0000 mg | ORAL_TABLET | Freq: Every day | ORAL | Status: DC
  Filled 2021-01-26: qty 2

## 2021-01-26 MED ORDER — VITAMIN D 25 MCG (1000 UNIT) PO TABS
1000.0000 [IU] | ORAL_TABLET | Freq: Every day | ORAL | Status: DC
  Administered 2021-01-27 – 2021-02-02 (×7): 1000 [IU] via ORAL
  Filled 2021-01-26 (×7): qty 1

## 2021-01-26 MED ORDER — LOSARTAN POTASSIUM-HCTZ 100-25 MG PO TABS: 0.5000 | ORAL_TABLET | Freq: Every day | ORAL | Status: DC

## 2021-01-26 MED ORDER — SODIUM CHLORIDE 0.9 % IV SOLN
80.0000 mg | Freq: Once | INTRAVENOUS | Status: AC
  Administered 2021-01-26: 80 mg via INTRAVENOUS
  Filled 2021-01-26: qty 80

## 2021-01-26 MED ORDER — METHOCARBAMOL 1000 MG/10ML IJ SOLN
500.0000 mg | Freq: Four times a day (QID) | INTRAVENOUS | Status: DC | PRN
  Filled 2021-01-26: qty 5

## 2021-01-26 MED ORDER — OXYCODONE HCL 5 MG PO TABS: 5.0000 mg | ORAL_TABLET | ORAL | Status: DC | PRN

## 2021-01-26 MED ORDER — AMLODIPINE BESYLATE 5 MG PO TABS
5.0000 mg | ORAL_TABLET | Freq: Every day | ORAL | Status: DC
  Administered 2021-01-27 – 2021-02-02 (×7): 5 mg via ORAL
  Filled 2021-01-26 (×7): qty 1

## 2021-01-26 MED ORDER — IPRATROPIUM BROMIDE 0.02 % IN SOLN
0.5000 mg | Freq: Four times a day (QID) | RESPIRATORY_TRACT | Status: DC | PRN
  Filled 2021-01-26: qty 2.5

## 2021-01-26 MED ORDER — ONDANSETRON HCL 4 MG PO TABS: 4.0000 mg | ORAL_TABLET | Freq: Four times a day (QID) | ORAL | Status: DC | PRN

## 2021-01-26 MED ORDER — ONDANSETRON HCL 4 MG/2ML IJ SOLN: 4.0000 mg | Freq: Four times a day (QID) | INTRAMUSCULAR | Status: DC | PRN

## 2021-01-26 MED ORDER — ALBUTEROL SULFATE (2.5 MG/3ML) 0.083% IN NEBU: 2.5000 mg | INHALATION_SOLUTION | RESPIRATORY_TRACT | Status: DC | PRN

## 2021-01-26 MED ORDER — BUDESONIDE 0.5 MG/2ML IN SUSP
0.5000 mg | Freq: Every day | RESPIRATORY_TRACT | Status: DC
  Administered 2021-01-27 – 2021-02-02 (×7): 0.5 mg via RESPIRATORY_TRACT
  Filled 2021-01-26 (×7): qty 2

## 2021-01-26 MED ORDER — ACETAMINOPHEN 325 MG PO TABS
650.0000 mg | ORAL_TABLET | Freq: Four times a day (QID) | ORAL | Status: DC | PRN
Start: 2021-01-26 — End: 2021-02-02
  Administered 2021-01-28 – 2021-01-31 (×3): 650 mg via ORAL
  Filled 2021-01-26 (×3): qty 2

## 2021-01-26 MED ORDER — ACETAMINOPHEN 650 MG RE SUPP
650.0000 mg | Freq: Four times a day (QID) | RECTAL | Status: DC | PRN
Start: 2021-01-26 — End: 2021-02-02

## 2021-01-26 MED ORDER — TRAZODONE HCL 50 MG PO TABS
150.0000 mg | ORAL_TABLET | Freq: Every day | ORAL | Status: DC
  Administered 2021-01-26 – 2021-02-01 (×7): 150 mg via ORAL
  Filled 2021-01-26 (×7): qty 3

## 2021-01-26 MED ORDER — LACTATED RINGERS IV SOLN: INTRAVENOUS | Status: DC

## 2021-01-26 MED ORDER — HYDROCHLOROTHIAZIDE 25 MG PO TABS
25.0000 mg | ORAL_TABLET | Freq: Every day | ORAL | Status: DC
  Administered 2021-01-27 – 2021-02-02 (×7): 25 mg via ORAL
  Filled 2021-01-26 (×7): qty 1

## 2021-01-26 MED ORDER — SODIUM CHLORIDE 0.9 % IV SOLN
8.0000 mg/h | INTRAVENOUS | Status: DC
  Administered 2021-01-26 – 2021-01-27 (×2): 8 mg/h via INTRAVENOUS
  Filled 2021-01-26 (×3): qty 80

## 2021-01-26 MED ORDER — RISAQUAD PO CAPS
2.0000 | ORAL_CAPSULE | Freq: Three times a day (TID) | ORAL | Status: DC
  Administered 2021-01-26 – 2021-02-02 (×18): 2 via ORAL
  Filled 2021-01-26 (×18): qty 2

## 2021-01-26 MED ORDER — ESCITALOPRAM OXALATE 10 MG PO TABS
10.0000 mg | ORAL_TABLET | Freq: Every day | ORAL | Status: DC
  Administered 2021-01-27 – 2021-02-02 (×7): 10 mg via ORAL
  Filled 2021-01-26 (×8): qty 1

## 2021-01-26 NOTE — ED Notes (Signed)
RN from floor messaged about patient prior to transport. Bed has been ready and assigned for 3 hours. Still waiting on response that RN is ready to receive patient.

## 2021-01-26 NOTE — ED Provider Notes (Signed)
[facility] Emergency Department Provider Note  Time seen: 5:00 PM  I have reviewed the triage vital signs and the nursing notes.   HISTORY  Chief Complaint Rectal Bleeding  HPI Trevor Ferguson is a 87 y.o. male with a past medical history of COPD, hypertension, CKD, presents to the emergency department for black stool.  According to the patient at 2 AM this morning his stool turned black color, he has since had 5 additional bowel movements that have all been very loose and black in color in the last which appear to have red blood in it as well per patient.  Denies any nausea or vomiting.  Denies any abdominal pain.  Patient does take a baby aspirin every day as well as 1 alcoholic drink each day.  No other anticoagulation.  No prior history of GI bleed.  Past Medical History:  Diagnosis Date   COPD (chronic obstructive pulmonary disease) ([place])    Hypertension    Pneumonia    2012,1985 hospitalized   Prostate cancer [facility])    with seed placement   Sleep apnea    noncompliant, dx 1996   Stroke ([place]) 2012   no residuals    Patient Active Problem List   Diagnosis Date Noted   Hemorrhoids    Rash    Depression    Weakness    Abdominal pain 03/06/2020   Acute kidney injury superimposed on CKD ([place]) 03/06/2020   Anemia 03/06/2020   Acute diverticulitis 03/01/2020   Pneumonia 07/17/2018   Leg swelling 12/18/2015   Chronic diastolic CHF (congestive heart failure) ([place]) 07/18/2015   Essential hypertension 07/18/2015   COPD (chronic obstructive pulmonary disease) ([place]) 03/28/2015   Sleep apnea 03/28/2015   SOB (shortness of breath) 03/28/2015    Past Surgical History:  Procedure Laterality Date   adenoid     APPENDECTOMY     HEMORRHOID SURGERY     spine cyst     TONSILLECTOMY      Prior to Admission medications   Medication Sig Start Date End Date Taking? Authorizing Provider  acetaminophen (TYLENOL) 325 MG tablet Take 2 tablets (650 mg total) by  mouth every 6 (six) hours as needed for mild pain (or Fever >/= 101). 03/13/20   Wieting, Richard, MD  acidophilus (RISAQUAD) CAPS capsule Take 2 capsules by mouth 3 (three) times daily. 03/13/20   Loletha Grayer, MD  albuterol (ACCUNEB) 0.63 MG/3ML nebulizer solution 3 (three) times daily as needed. 03/31/20   [provider]  amLODipine (NORVASC) 5 MG tablet Take 5 mg by mouth daily.    [provider]  aspirin EC 81 MG tablet Take 81 mg by mouth daily. Swallow whole.    [provider]  budesonide (PULMICORT) 0.5 MG/2ML nebulizer solution Inhale into the lungs. 03/22/20 03/22/21  [provider]  Cholecalciferol (VITAMIN D-3) 25 MCG (1000 UT) CAPS Take 1,000 Units by mouth daily.     [provider]  escitalopram (LEXAPRO) 10 MG tablet Take 10 mg by mouth daily.     [provider]  Ferrous Sulfate (IRON PO) Take by mouth daily.    [provider]  hydrocortisone cream 1 % Apply up to 4 times a day to hemmorhoids 03/13/20   Loletha Grayer, MD  loperamide (IMODIUM) 2 MG capsule Take 1 capsule (2 mg total) by mouth every 8 (eight) hours as needed for diarrhea or loose stools. 03/13/20   Loletha Grayer, MD  losartan-hydrochlorothiazide (HYZAAR) 100-25 MG tablet Take 0.5  tablets by mouth daily.    [provider]  potassium chloride (MICRO-K) 10 MEQ CR capsule Take 10 mEq by mouth daily as needed. Takes when taking Torsemide.    [provider]  torsemide (DEMADEX) 10 MG tablet Take 10 mg by mouth daily as needed.    [provider]  traZODone (DESYREL) 150 MG tablet Take 150 mg by mouth at bedtime.    [provider]  triamcinolone (KENALOG) 0.025 % cream Apply topically 2 (two) times daily. 02/01/20   [provider]    Allergies  Allergen Reactions   Penicillins Hives, Rash and Other (See Comments)    Rash on hands and blisters on ankles when 87 years old Patient tolerates Zosyn   Bee  Venom Palpitations    INSECT BITES/STINGS   Darvon [Propoxyphene] Nausea Only and Rash    Family History  Problem Relation Age of Onset   Congestive Heart Failure Mother    COPD Sister    Asthma Sister    Tuberculosis Maternal Grandmother    Stroke Maternal Grandfather     Social History Social History   Tobacco Use   Smoking status: Former    Years: 40.00    Pack years: 0.00    Types: Cigarettes    Quit date: 08/19/1985    Years since quitting: 35.4   Smokeless tobacco: Never  Vaping Use   Vaping Use: Never used  Substance Use Topics   Alcohol use: Yes    Alcohol/week: 14.0 standard drinks    Types: 14 Shots of liquor per week   Drug use: No    Review of Systems Constitutional: Negative for fever. Cardiovascular: Negative for chest pain. Respiratory: Negative for shortness of breath. Gastrointestinal: Negative for abdominal pain, vomiting.  Positive for dark stool. Musculoskeletal: Negative for musculoskeletal complaints Neurological: Negative for headache All other ROS negative  ____________________________________________   PHYSICAL EXAM:  VITAL SIGNS: ED Triage Vitals  Enc Vitals Group     BP 01/26/21 1408 (!) 113/99     Pulse Rate 01/26/21 1408 83     Resp 01/26/21 1408 17     Temp 01/26/21 1408 98.8 F (37.1 C)     Temp Source 01/26/21 1408 Oral     SpO2 01/26/21 1408 96 %     Weight 01/26/21 1409 210 lb (95.3 kg)     Height 01/26/21 1409 5\' 10"  (1.778 m)     Head Circumference --      Peak Flow --      Pain Score 01/26/21 1409 0     Pain Loc --      Pain Edu? --      Excl. in [place]? --    Constitutional: Alert and oriented. Well appearing and in no distress.  Appears younger than stated age. Eyes: Normal exam ENT      Head: Normocephalic and atraumatic.      Mouth/Throat: Mucous membranes are moist. Cardiovascular: Normal rate, regular rhythm.  Respiratory: Normal respiratory effort without tachypnea nor retractions. Breath sounds are clear   Gastrointestinal: Soft and nontender. No distention.  Rectal examination shows melanotic strongly guaiac positive Musculoskeletal: Nontender with normal range of motion in all extremities. Neurologic:  Normal speech and language. No gross focal neurologic deficit Skin:  Skin is warm, dry and intact.  Psychiatric: Mood and affect are normal.    INITIAL IMPRESSION / ASSESSMENT AND PLAN / ED COURSE  Pertinent labs & imaging results that were available during my care of the  patient were reviewed by me and considered in my medical decision making (see chart for details).   Patient presents to the emergency department for GI bleed.  Patient's rectal exam shows melena strongly guaiac positive.  Nontender/benign abdominal exam.  Lab work does show mild leukocytosis of 15,000.  Hemoglobin is dropped approximately 2 points from historical values.  Does not currently require a transfusion.  We will start the patient on Protonix bolus and infusion and admit to the hospital service for further work-up and treatment and likely GI consultation.  [name] was evaluated in Emergency Department on 01/26/2021 for the symptoms described in the history of present illness. He was evaluated in the context of the global COVID-19 pandemic, which necessitated consideration that the patient might be at risk for infection with the SARS-CoV-2 virus that causes COVID-19. Institutional protocols and algorithms that pertain to the evaluation of patients at risk for COVID-19 are in a state of rapid change based on information released by regulatory bodies including the CDC and federal and state organizations. These policies and algorithms were followed during the patient's care in the ED.  ____________________________________________   FINAL CLINICAL IMPRESSION(S) / ED DIAGNOSES  GI bleed   Harvest Dark, MD 01/26/21 1706

## 2021-01-26 NOTE — H&P (Signed)
[facility] History and Physical  Trevor Ferguson DXA:[id number] DOB: Nov 25, 1925 DOA: 01/26/2021  Referring physician: Dr. Kerman Passey PCP: Juluis Pitch, MD   Chief Complaint: black stool  HPI: Trevor Ferguson is a 87 y.o. male with history of diastolic CHF, COPD newly on home O2, CKD, diverticulitis, prior CVA, hypertension, who presents with black stools.  Patient reports that around 2 AM this morning he had a small amount of fecal incontinence and then went to the bathroom and had a bowel movement that was mostly black with a small amount of bright red blood as well.  He subsequently had 4-5 more similar bowel movements over the course of the night and decided to present to the emergency room for evaluation.  He denies using any NSAIDs, stating he only uses acetaminophen, though he does take a baby aspirin daily.  He does endorse having a daily cocktail hour where he has to double bourbon drink every day.  In the ED initial vital signs notable only for mild hypertension.  Lab work-up notable for hemoglobin of 9 decreased from 10.8 approximately 1 year ago as well as leukocytosis with WBC of 15.6, creatinine of 1.9 which is roughly his baseline and otherwise unremarkable CMP.  He was started on IV PPI drip, type and screen was obtained, and he was admitted for further management of suspected upper GI bleed.  Review of Systems:  Pertinent positives and negative per HPI, all others reviewed and negative  Past Medical History:  Diagnosis Date   COPD (chronic obstructive pulmonary disease) ([place])    Hypertension    Pneumonia    2012,1985 hospitalized   Prostate cancer ([place])    with seed placement   Sleep apnea    noncompliant, dx 1996   Stroke ([place]) 2012   no residuals   Past Surgical History:  Procedure Laterality Date   adenoid     APPENDECTOMY     HEMORRHOID SURGERY     spine cyst     TONSILLECTOMY     Social History:  reports that he quit smoking about 35 years ago.  His smoking use included cigarettes. He has never used smokeless tobacco. He reports current alcohol use of about 14.0 standard drinks of alcohol per week. He reports that he does not use drugs.  Allergies  Allergen Reactions   Penicillins Hives, Rash and Other (See Comments)    Rash on hands and blisters on ankles when 87 years old Patient tolerates Zosyn   Bee Venom Palpitations    INSECT BITES/STINGS   Darvon [Propoxyphene] Nausea Only and Rash    Family History  Problem Relation Age of Onset   Congestive Heart Failure Mother    COPD Sister    Asthma Sister    Tuberculosis Maternal Grandmother    Stroke Maternal Grandfather      Prior to Admission medications   Medication Sig Start Date End Date Taking? Authorizing Provider  acetaminophen (TYLENOL) 325 MG tablet Take 2 tablets (650 mg total) by mouth every 6 (six) hours as needed for mild pain (or Fever >/= 101). 03/13/20   Wieting, Richard, MD  acidophilus (RISAQUAD) CAPS capsule Take 2 capsules by mouth 3 (three) times daily. 03/13/20   Loletha Grayer, MD  albuterol (ACCUNEB) 0.63 MG/3ML nebulizer solution 3 (three) times daily as needed. 03/31/20   [provider]  amLODipine (NORVASC) 5 MG tablet Take 5 mg by mouth daily.    [provider]  aspirin EC 81 MG tablet Take 81 mg  by mouth daily. Swallow whole.    [provider]  budesonide (PULMICORT) 0.5 MG/2ML nebulizer solution Inhale into the lungs. 03/22/20 03/22/21  [provider]  Cholecalciferol (VITAMIN D-3) 25 MCG (1000 UT) CAPS Take 1,000 Units by mouth daily.     [provider]  escitalopram (LEXAPRO) 10 MG tablet Take 10 mg by mouth daily.     [provider]  Ferrous Sulfate (IRON PO) Take by mouth daily.    [provider]  hydrocortisone cream 1 % Apply up to 4 times a day to hemmorhoids 03/13/20   Loletha Grayer, MD  loperamide (IMODIUM) 2 MG capsule Take 1 capsule (2 mg total) by mouth every 8 (eight)  hours as needed for diarrhea or loose stools. 03/13/20   Loletha Grayer, MD  losartan-hydrochlorothiazide (HYZAAR) 100-25 MG tablet Take 0.5 tablets by mouth daily.    [provider]  potassium chloride (MICRO-K) 10 MEQ CR capsule Take 10 mEq by mouth daily as needed. Takes when taking Torsemide.    [provider]  torsemide (DEMADEX) 10 MG tablet Take 10 mg by mouth daily as needed.    [provider]  traZODone (DESYREL) 150 MG tablet Take 150 mg by mouth at bedtime.    [provider]  triamcinolone (KENALOG) 0.025 % cream Apply topically 2 (two) times daily. 02/01/20   [provider]   Physical Exam: Vitals:   01/26/21 1408 01/26/21 1409  BP: (!) 113/99   Pulse: 83   Resp: 17   Temp: 98.8 F (37.1 C)   TempSrc: Oral   SpO2: 96%   Weight:  95.3 kg  Height:  5\' 10"  (1.778 m)    Wt Readings from Last 3 Encounters:  01/26/21 95.3 kg  10/23/20 92.7 kg  04/25/20 91.6 kg     General:  Appears calm and comfortable Eyes: PERRL, normal lids, irises & conjunctiva ENT: grossly normal hearing, lips & tongue Neck: no masses Cardiovascular: RRR, no m/r/g. No LE edema. Telemetry: SR, no arrhythmias  Respiratory: CTA bilaterally, no w/r/r. Normal respiratory effort. Abdomen: soft, ntnd Skin: no rash or induration seen on limited exam Musculoskeletal: grossly normal tone BUE/BLE Psychiatric: grossly normal mood and affect, speech fluent and appropriate Neurologic: grossly non-focal.          Labs on Admission:  Basic Metabolic Panel: Recent Labs  Lab 01/26/21 1411  NA 135  K 4.2  CL 102  CO2 23  GLUCOSE 152*  BUN 60*  CREATININE 1.91*  CALCIUM 8.6*   Liver Function Tests: Recent Labs  Lab 01/26/21 1411  AST 18  ALT 15  ALKPHOS 47  BILITOT 0.7  PROT 6.1*  ALBUMIN 3.7   No results for input(s): LIPASE, AMYLASE in the last 168 hours. No results for input(s): AMMONIA in the last 168 hours. CBC: Recent Labs  Lab  01/26/21 1411  WBC 15.6*  HGB 9.0*  HCT 25.5*  MCV 91.1  PLT 198   Cardiac Enzymes: No results for input(s): CKTOTAL, CKMB, CKMBINDEX, TROPONINI in the last 168 hours.  BNP (last 3 results) Recent Labs    03/01/20 1907 03/06/20 1628  BNP 331.9* 445.3*    ProBNP (last 3 results) No results for input(s): PROBNP in the last 8760 hours.  CBG: No results for input(s): GLUCAP in the last 168 hours.  Radiological Exams on Admission: No results found.  EKG: Not obtained  Assessment/Plan Active Problems:   COPD (chronic obstructive pulmonary disease) ([facility])   Chronic diastolic CHF (  congestive heart failure) ([facility])   Essential hypertension   Trevor Ferguson is a 87 y.o. male with history of diastolic CHF, COPD newly on home O2, CKD, diverticulitis, prior CVA, hypertension, who presents with black stools and BRBPR concerning for GI bleed.  #Acute GI Bleed Patient reports combination of melena and bright red blood.  Given frequency of bowel movements suspect this represents rapid transit from the upper GI tract rather than a combination of upper or lower GI bleeding, however patient does have a history of diverticulitis so this should be considered as well. - Continue IV PPI - Clear liquid diet - Very difficult access, will need second IV placed -LR at 100 cc/h - Hold baby aspirin -Trend CBC every 6 hours -Consult GI in a.m.  #Known medical problems COPD-continue 1 L oxygen, albuterol, Pulmicort Hypertension-continue amlodipine, losartan, hydrochlorothiazide Depression-continue escitalopram, trazodone  Code Status: DNR/DNI DVT Prophylaxis: SCDs Family Communication: Daughter updated at bedside Disposition Plan: Inpatient, Med-Surg   Time spent: 80 min  Clarnce Flock MD/MPH [facility]  Note:  This document was prepared using Systems analyst and may include unintentional dictation errors.

## 2021-01-26 NOTE — ED Triage Notes (Signed)
Pt comes into the ED via POV c/o rectal bleeding that started today.  Pt states that his stool is normally okay, but today it started looking black in color.  Pt denies any Rx blood thinners but he does take ASA daily.  Pt denies any abdominal pain at this time.  Pt states that he did have stomach aches last night before bed.

## 2021-01-27 ENCOUNTER — Encounter
Admission: EM | Disposition: A | Discharge status: Home / Self Care | Payer: Self-pay | Source: Home / Self Care | Attending: Family Medicine

## 2021-01-27 ENCOUNTER — Inpatient Hospital Stay: Payer: Medicare PPO | Admitting: Anesthesiology

## 2021-01-27 DIAGNOSIS — J449 Chronic obstructive pulmonary disease, unspecified: Secondary | ICD-10-CM

## 2021-01-27 DIAGNOSIS — D62 Acute posthemorrhagic anemia: Secondary | ICD-10-CM

## 2021-01-27 DIAGNOSIS — K921 Melena: Secondary | ICD-10-CM

## 2021-01-27 DIAGNOSIS — I10 Essential (primary) hypertension: Secondary | ICD-10-CM

## 2021-01-27 DIAGNOSIS — K625 Hemorrhage of anus and rectum: Secondary | ICD-10-CM

## 2021-01-27 DIAGNOSIS — I5032 Chronic diastolic (congestive) heart failure: Secondary | ICD-10-CM

## 2021-01-27 HISTORY — PX: ESOPHAGOGASTRODUODENOSCOPY (EGD) WITH PROPOFOL: SHX5813

## 2021-01-27 LAB — HEMOGLOBIN AND HEMATOCRIT, BLOOD
HCT: 23.6 % — ABNORMAL LOW (ref 39.0–52.0)
Hemoglobin: 8.4 g/dL — ABNORMAL LOW (ref 13.0–17.0)

## 2021-01-27 LAB — BASIC METABOLIC PANEL
Anion gap: 7 (ref 5–15)
BUN: 68 mg/dL — ABNORMAL HIGH (ref 8–23)
CO2: 24 mmol/L (ref 22–32)
Calcium: 8.2 mg/dL — ABNORMAL LOW (ref 8.9–10.3)
Chloride: 105 mmol/L (ref 98–111)
Creatinine, Ser: 1.86 mg/dL — ABNORMAL HIGH (ref 0.61–1.24)
GFR, Estimated: 34 mL/min — ABNORMAL LOW (ref >60)
Glucose, Bld: 122 mg/dL — ABNORMAL HIGH (ref 70–99)
Potassium: 4.3 mmol/L (ref 3.5–5.1)
Sodium: 136 mmol/L (ref 135–145)

## 2021-01-27 LAB — CBC
HCT: 19.2 % — ABNORMAL LOW (ref 39.0–52.0)
HCT: 22.2 % — ABNORMAL LOW (ref 39.0–52.0)
Hemoglobin: 6.7 g/dL — ABNORMAL LOW (ref 13.0–17.0)
Hemoglobin: 7.8 g/dL — ABNORMAL LOW (ref 13.0–17.0)
MCH: 32.6 pg (ref 26.0–34.0)
MCH: 33 pg (ref 26.0–34.0)
MCHC: 34.9 g/dL (ref 30.0–36.0)
MCHC: 35.1 g/dL (ref 30.0–36.0)
MCV: 92.9 fL (ref 80.0–100.0)
MCV: 94.6 fL (ref 80.0–100.0)
Platelets: 144 10*3/uL — ABNORMAL LOW (ref 150–400)
Platelets: 180 10*3/uL (ref 150–400)
RBC: 2.03 MIL/uL — ABNORMAL LOW (ref 4.22–5.81)
RBC: 2.39 MIL/uL — ABNORMAL LOW (ref 4.22–5.81)
RDW: 14.4 % (ref 11.5–15.5)
RDW: 14.4 % (ref 11.5–15.5)
WBC: 10 10*3/uL (ref 4.0–10.5)
WBC: 15.3 10*3/uL — ABNORMAL HIGH (ref 4.0–10.5)
nRBC: 0 % (ref 0.0–0.2)
nRBC: 0 % (ref 0.0–0.2)

## 2021-01-27 LAB — PREPARE RBC (CROSSMATCH)

## 2021-01-27 SURGERY — ESOPHAGOGASTRODUODENOSCOPY (EGD) WITH PROPOFOL
Anesthesia: Monitor Anesthesia Care

## 2021-01-27 MED ORDER — SODIUM CHLORIDE 0.9 % IV SOLN: INTRAVENOUS | Status: DC | PRN

## 2021-01-27 MED ORDER — PROPOFOL 500 MG/50ML IV EMUL
INTRAVENOUS | Status: DC | PRN
  Administered 2021-01-27: 100 ug/kg/min via INTRAVENOUS

## 2021-01-27 MED ORDER — LIDOCAINE HCL (CARDIAC) PF 100 MG/5ML IV SOSY
PREFILLED_SYRINGE | INTRAVENOUS | Status: DC | PRN
  Administered 2021-01-27: 100 mg via INTRAVENOUS

## 2021-01-27 MED ORDER — PROPOFOL 10 MG/ML IV BOLUS
INTRAVENOUS | Status: DC | PRN
  Administered 2021-01-27 (×2): 20 mg via INTRAVENOUS

## 2021-01-27 MED ORDER — SODIUM CHLORIDE 0.9% IV SOLUTION: Freq: Once | INTRAVENOUS | Status: AC

## 2021-01-27 MED ORDER — PEG 3350-KCL-NA BICARB-NACL 420 G PO SOLR
4000.0000 mL | Freq: Once | ORAL | Status: AC
  Administered 2021-01-27: 16:00:00 4000 mL via ORAL
  Filled 2021-01-27: qty 4000

## 2021-01-27 NOTE — Anesthesia Preprocedure Evaluation (Signed)
Anesthesia Evaluation  Patient identified by MRN, date of birth, ID band Patient awake    Reviewed: Allergy & Precautions, NPO status , Patient's Chart, lab work & pertinent test results  History of Anesthesia Complications Negative for: history of anesthetic complications  Airway Mallampati: II  TM Distance: >3 FB Neck ROM: Full    Dental no notable dental hx. (+) Teeth Intact   Pulmonary neg pulmonary ROS, sleep apnea and Continuous Positive Airway Pressure Ventilation , pneumonia, resolved, COPD (Uses nebs daily),  COPD inhaler, former smoker,    Pulmonary exam normal breath sounds clear to auscultation       Cardiovascular Exercise Tolerance: Poor hypertension, Pt. on medications +CHF  negative cardio ROS Normal cardiovascular exam Rhythm:Regular Rate:Normal     Neuro/Psych PSYCHIATRIC DISORDERS Depression CVA negative neurological ROS  negative psych ROS   GI/Hepatic negative GI ROS, Neg liver ROS,   Endo/Other  negative endocrine ROS  Renal/GU Renal InsufficiencyRenal diseasenegative Renal ROS  negative genitourinary   Musculoskeletal negative musculoskeletal ROS (+)   Abdominal   Peds negative pediatric ROS (+)  Hematology negative hematology ROS (+) anemia ,   Anesthesia Other Findings   Reproductive/Obstetrics negative OB ROS                             Anesthesia Physical Anesthesia Plan  ASA: 3  Anesthesia Plan: MAC   Post-op Pain Management:    Induction: Intravenous  PONV Risk Score and Plan:   Airway Management Planned: Natural Airway and Nasal Cannula  Additional Equipment:   Intra-op Plan:   Post-operative Plan:   Informed Consent: I have reviewed the patients History and Physical, chart, labs and discussed the procedure including the risks, benefits and alternatives for the proposed anesthesia with the patient or authorized representative who has  indicated his/her understanding and acceptance.     Dental Advisory Given  Plan Discussed with: Anesthesiologist, CRNA and Surgeon  Anesthesia Plan Comments: (Patient consented for risks of anesthesia including but not limited to:  - adverse reactions to medications - damage to eyes, teeth, lips or other oral mucosa - nerve damage due to positioning  - sore throat or hoarseness - Damage to heart, brain, nerves, lungs, other parts of body or loss of life  Patient voiced understanding.)        Anesthesia Quick Evaluation

## 2021-01-27 NOTE — Transfer of Care (Signed)
Immediate Anesthesia Transfer of Care Note  Patient: Trevor Ferguson  Procedure(s) Performed: ESOPHAGOGASTRODUODENOSCOPY (EGD) WITH PROPOFOL  Patient Location: PACU  Anesthesia Type:MAC  Level of Consciousness: awake  Airway & Oxygen Therapy: Patient Spontanous Breathing  Post-op Assessment: Report given to RN  Post vital signs: stable  Last Vitals:  Vitals Value Taken Time  BP    Temp    Pulse    Resp 12 01/27/21 0958  SpO2    Vitals shown include unvalidated device data.  Last Pain:  Vitals:   01/27/21 0921  TempSrc: Temporal  PainSc: 0-No pain         Complications: No notable events documented.

## 2021-01-27 NOTE — Progress Notes (Signed)
[facility] PROGRESS NOTE    Trevor Ferguson  TMA:[id number] DOB: [birth year] DOA: 01/26/2021 PCP: Trevor Pitch, MD      Brief Narrative:  Trevor Ferguson is a 87 y.o. M with obesity, COPD, dCHF on home O2, prostate CA, OSA not on CPAP, hx stroke, hx diverticulitis, hx EPEC diarrhea, and HTN who presented with melena for 1 day.  Patient was in his usual state of health until day of admission when he had some fecal incontinence and black stool mixed with red blood, so he came to the ER.  In the ER, hemoglobin 9 g/dL he had another black stool.  Started on IV PPI, IV fluids and admitted to the hospital hemodynamically stable.  Denies NSAIDs.  Not on anticoagulation.       Assessment & Plan:  Acute GI bleed Usually thought this was GERD due to the melanotic character.  EGD just now was unremarkable. - Stop PPI drip - Continue clears, start bowel prep - Consult gastroenterology, they plan for colonoscopy tomorrow, appreciate expert cares   Blood loss anemia Hemoglobin at his PCP 1 month ago was greater than 12.  Hemoglobin down to 6.7 this morning. - Transfuse once - No active cardiac symptoms, transfusion threshold 7 g/dL -Trend hemoglobin  Chronic diastolic CHF Hypertension Pressure normal, appears euvolemic - Continue amlodipine, HCTZ  -Hold losartan  Chronic hypoxic respiratory failure Due to CHF and COPD  COPD No active flare - As needed bronchodilators - Hold home azithromycin - Continue home Pulmicort  Chronic kidney disease, stage IIIb Creatinine stable relative to baseline - Hold home baby aspirin   Insomnia - Continue home trazodone     Disposition: Status is: Inpatient  Remains inpatient appropriate because:   He has bleeding requiring transfusion, requires ongoing work-up with hemoglobin trend and colonoscopy  Dispo: The patient is from: Home              Anticipated d/c is to: Home              Patient currently is not  medically stable to d/c.   Difficult to place patient No       Level of care: Med-Surg       MDM: The below labs and imaging reports were reviewed and summarized above.  Medication management as above.    DVT prophylaxis: SCDs Start: 01/26/21 1843  Code Status: DNR Family Communication: Daughter at the bedside    Consultants:  Gastroenterology  Procedures:  6/11 EGD: Normal esophagus, stomach without ulcers or gastritis or bleeding, normal duodenum          Subjective: Feels well, he has a little cough, no dizziness, chest pain, fatigue, confusion, fever, nausea.  He had 1 black stool this morning.  Objective: Vitals:   01/27/21 1016 01/27/21 1126 01/27/21 1157 01/27/21 1218  BP: (!) 107/38 (!) 139/48 (!) 124/50 114/80  Pulse:  68 73 67  Resp:  18 18 18   Temp:  97.8 F (36.6 C) 97.8 F (36.6 C) 97.8 F (36.6 C)  TempSrc:  Oral Oral Oral  SpO2:  100% 98% 99%  Weight:      Height:        Intake/Output Summary (Last 24 hours) at 01/27/2021 1238 Last data filed at 01/27/2021 0947 Gross per 24 hour  Intake 511.6 ml  Output --  Net 511.6 ml   Filed Weights   01/26/21 1409 01/26/21 2332  Weight: 95.3 kg 93.9 kg    Examination: General appearance:  Elderly adult male, alert and in no acute distress.  Sitting up in bed HEENT: Anicteric, conjunctiva pink, lids and lashes normal. No nasal deformity, discharge, epistaxis.  Lips moist.   Skin: Warm and dry.  no jaundice.  No suspicious rashes or lesions. Cardiac: RRR, nl S1-S2, no murmurs appreciated.  Capillary refill is brisk.  JVP not visible.  No LE edema.  Radial pulses 2+ and symmetric. Respiratory: Normal respiratory rate and rhythm.  CTAB without rales or wheezes. Abdomen: Abdomen soft.  no TTP or guarding. No ascites, distension, hepatosplenomegaly.   MSK: No deformities or effusions. Neuro: Awake and alert.  EOMI, moves all extremities. Speech fluent.    Psych: Sensorium intact and responding  to questions, attention normal. Affect pleasant.  Judgment and insight appear normal.    Data Reviewed: I have personally reviewed following labs and imaging studies:  CBC: Recent Labs  Lab 01/26/21 1411 01/26/21 2352 01/27/21 0410  WBC 15.6* 15.3* 10.0  HGB 9.0* 7.8* 6.7*  HCT 25.5* 22.2* 19.2*  MCV 91.1 92.9 94.6  PLT 198 180 354*   Basic Metabolic Panel: Recent Labs  Lab 01/26/21 1411 01/27/21 0410  NA 135 136  K 4.2 4.3  CL 102 105  CO2 23 24  GLUCOSE 152* 122*  BUN 60* 68*  CREATININE 1.91* 1.86*  CALCIUM 8.6* 8.2*   GFR: Estimated Creatinine Clearance: 28.7 mL/min (A) (by C-G formula based on SCr of 1.86 mg/dL (H)). Liver Function Tests: Recent Labs  Lab 01/26/21 1411  AST 18  ALT 15  ALKPHOS 47  BILITOT 0.7  PROT 6.1*  ALBUMIN 3.7   No results for input(s): LIPASE, AMYLASE in the last 168 hours. No results for input(s): AMMONIA in the last 168 hours. Coagulation Profile: No results for input(s): INR, PROTIME in the last 168 hours. Cardiac Enzymes: No results for input(s): CKTOTAL, CKMB, CKMBINDEX, TROPONINI in the last 168 hours. BNP (last 3 results) No results for input(s): PROBNP in the last 8760 hours. HbA1C: No results for input(s): HGBA1C in the last 72 hours. CBG: No results for input(s): GLUCAP in the last 168 hours. Lipid Profile: No results for input(s): CHOL, HDL, LDLCALC, TRIG, CHOLHDL, LDLDIRECT in the last 72 hours. Thyroid Function Tests: No results for input(s): TSH, T4TOTAL, FREET4, T3FREE, THYROIDAB in the last 72 hours. Anemia Panel: No results for input(s): VITAMINB12, FOLATE, FERRITIN, TIBC, IRON, RETICCTPCT in the last 72 hours. Urine analysis:    Component Value Date/Time   COLORURINE YELLOW (A) 03/06/2020 2130   APPEARANCEUR CLEAR (A) 03/06/2020 2130   APPEARANCEUR Clear 03/19/2014 1049   LABSPEC 1.014 03/06/2020 2130   LABSPEC 1.016 03/19/2014 1049   PHURINE 5.0 03/06/2020 2130   GLUCOSEU NEGATIVE 03/06/2020 2130    GLUCOSEU Negative 03/19/2014 1049   HGBUR NEGATIVE 03/06/2020 2130   BILIRUBINUR NEGATIVE 03/06/2020 2130   BILIRUBINUR Negative 03/19/2014 Trevor Ferguson 03/06/2020 2130   PROTEINUR 30 (A) 03/06/2020 2130   NITRITE NEGATIVE 03/06/2020 2130   LEUKOCYTESUR TRACE (A) 03/06/2020 2130   LEUKOCYTESUR Negative 03/19/2014 1049   Sepsis Labs: @LABRCNTIP (procalcitonin:4,lacticacidven:4)  ) Recent Results (from the past 240 hour(s))  Resp Panel by RT-PCR (Flu A&B, Covid) Nasopharyngeal Swab     Status: None   Collection Time: 01/26/21  5:01 PM   Specimen: Nasopharyngeal Swab; Nasopharyngeal(NP) swabs in vial transport medium  Result Value Ref Range Status   SARS Coronavirus 2 by RT PCR NEGATIVE NEGATIVE Final    Comment: (NOTE) SARS-CoV-2 target nucleic acids are NOT  DETECTED.  The SARS-CoV-2 RNA is generally detectable in upper respiratory specimens during the acute phase of infection. The lowest concentration of SARS-CoV-2 viral copies this assay can detect is 138 copies/mL. A negative result does not preclude SARS-Cov-2 infection and should not be used as the sole basis for treatment or other patient management decisions. A negative result may occur with  improper specimen collection/handling, submission of specimen other than nasopharyngeal swab, presence of viral mutation(s) within the areas targeted by this assay, and inadequate number of viral copies(<138 copies/mL). A negative result must be combined with clinical observations, patient history, and epidemiological information. The expected result is Negative.  Fact Sheet for Patients:  EntrepreneurPulse.com.au  Fact Sheet for Healthcare Providers:  IncredibleEmployment.be  This test is no t yet approved or cleared by the [place] FDA and  has been authorized for detection and/or diagnosis of SARS-CoV-2 by FDA under an Emergency Use Authorization (EUA). This EUA will  remain  in effect (meaning this test can be used) for the duration of the COVID-19 declaration under Section 564(b)(1) of the Act, 21 U.S.C.section 360bbb-3(b)(1), unless the authorization is terminated  or revoked sooner.       Influenza A by PCR NEGATIVE NEGATIVE Final   Influenza B by PCR NEGATIVE NEGATIVE Final    Comment: (NOTE) The Xpert Xpress SARS-CoV-2/FLU/RSV plus assay is intended as an aid in the diagnosis of influenza from Nasopharyngeal swab specimens and should not be used as a sole basis for treatment. Nasal washings and aspirates are unacceptable for Xpert Xpress SARS-CoV-2/FLU/RSV testing.  Fact Sheet for Patients: EntrepreneurPulse.com.au  Fact Sheet for Healthcare Providers: IncredibleEmployment.be  This test is not yet approved or cleared by the [place] FDA and has been authorized for detection and/or diagnosis of SARS-CoV-2 by FDA under an Emergency Use Authorization (EUA). This EUA will remain in effect (meaning this test can be used) for the duration of the COVID-19 declaration under Section 564(b)(1) of the Act, 21 U.S.C. section 360bbb-3(b)(1), unless the authorization is terminated or revoked.  Performed at [facility], [address], [place], [place] [address]          Radiology Studies: No results found.      Scheduled Meds:  acidophilus  2 capsule Oral TID   amLODipine  5 mg Oral Daily   budesonide  0.5 mg Nebulization Daily   cholecalciferol  1,000 Units Oral Daily   escitalopram  10 mg Oral Daily   losartan  100 mg Oral Daily   And   hydrochlorothiazide  25 mg Oral Daily   polyethylene glycol-electrolytes  4,000 mL Oral Once   traZODone  150 mg Oral QHS   Continuous Infusions:  methocarbamol (ROBAXIN) IV       LOS: 1 day    Time spent: 25 minutes    Edwin Dada, MD [facility] 01/27/2021, 12:38 PM     Please page though [employer] or Epic secure  chat:  For [employer], Adult nurse

## 2021-01-27 NOTE — Anesthesia Postprocedure Evaluation (Signed)
Anesthesia Post Note  Patient: Trevor Ferguson  Procedure(s) Performed: ESOPHAGOGASTRODUODENOSCOPY (EGD) WITH PROPOFOL  Patient location during evaluation: Endoscopy Anesthesia Type: MAC Level of consciousness: awake and alert Pain management: pain level controlled Vital Signs Assessment: post-procedure vital signs reviewed and stable Respiratory status: spontaneous breathing, nonlabored ventilation, respiratory function stable and patient connected to nasal cannula oxygen Cardiovascular status: blood pressure returned to baseline and stable Postop Assessment: no apparent nausea or vomiting Anesthetic complications: no   No notable events documented.   Last Vitals:  Vitals:   01/27/21 1218 01/27/21 1540  BP: 114/80 133/65  Pulse: 67 71  Resp: 18 15  Temp: 36.6 C 36.8 C  SpO2: 99% 100%    Last Pain:  Vitals:   01/27/21 1540  TempSrc: Oral  PainSc:                  Tonny Bollman

## 2021-01-27 NOTE — Progress Notes (Signed)
Patient seen and examined. Planning EGD today. Full consult to follow.

## 2021-01-27 NOTE — OR Nursing (Signed)
Report TO   PRIMARY RN ERICA. PT REPORTS HE HAS BEEN COUGHING MORE LAST 24HRS .RN REPORTS SHE WILL RELAY MESSAGE TO MD FOLLOWING PT. PT S/P EGD. TO RETURN TO ROOM FOR COLONOSCOPY IN AM. PT I/S TO DRINK ALL PREP TODAY AND NPO AFTER MN. UNDERSTANDING VOICED BY PT AND RN

## 2021-01-27 NOTE — Plan of Care (Signed)

## 2021-01-27 NOTE — Consult Note (Signed)
Lucilla Lame, MD [facility]  [address], [place], [place] [address] Phone: [phone] Fax : [phone]  Consultation  Referring Provider:     Dr. Dione Plover Primary Care Physician:  Juluis Pitch, MD Primary Gastroenterologist:  Dr. Lupita Leash GI         Reason for Consultation:     Melena  Date of Admission:  01/26/2021 Date of Consultation:  01/27/2021         HPI:   Trevor Ferguson is a 87 y.o. male who was admitted with melena.  The patient has a history of being seen by GI and was last seen and April of this year for follow-up after diverticulitis with abdominal pain chronic diarrhea and prior EPEC infection. The patient reports that he started to have fecal incontinence and noted that he had some black stools mixed with bright red blood per rectum.  He subsequently had multiple bowel movements after that but denies having any bowel movements overnight or this morning.  He also denies using any NSAIDs or having abdominal pain.  The patient has had no nausea or vomiting.  He does drink a double bourbon every day.  He denies any history of GI bleed in the past. The patient's hemoglobin 10 months ago was's between 10.8 and 11.8 and a patient came in with a hemoglobin of 9 last night that went down to 7.8 which is 6.7 this morning.  Past Medical History:  Diagnosis Date  . COPD (chronic obstructive pulmonary disease) ([place])   . Hypertension   . Pneumonia    [id number] hospitalized  . Prostate cancer ([place])    with seed placement  . Sleep apnea    noncompliant, dx 1996  . Stroke [facility]) 2012   no residuals    Past Surgical History:  Procedure Laterality Date  . adenoid    . APPENDECTOMY    . HEMORRHOID SURGERY    . spine cyst    . TONSILLECTOMY      Prior to Admission medications   Medication Sig Start Date End Date Taking? Authorizing Provider  albuterol (ACCUNEB) 0.63 MG/3ML nebulizer solution 3 (three) times daily as needed. 03/31/20  Yes [provider]   amLODipine (NORVASC) 5 MG tablet Take 5 mg by mouth daily.   Yes [provider]  Apoaequorin (PREVAGEN) 10 MG CAPS Take 10 mg by mouth daily.   Yes [provider]  aspirin EC 81 MG tablet Take 81 mg by mouth daily. Swallow whole.   Yes [provider]  azithromycin (ZITHROMAX) 250 MG tablet Take 250 mg by mouth 3 (three) times a week. 01/18/21  Yes [provider]  budesonide (PULMICORT) 0.5 MG/2ML nebulizer solution Inhale into the lungs. 03/22/20 03/22/21 Yes [provider]  Cholecalciferol (VITAMIN D-3) 25 MCG (1000 UT) CAPS Take 1,000 Units by mouth daily.    Yes [provider]  escitalopram (LEXAPRO) 10 MG tablet Take 10 mg by mouth daily.    Yes [provider]  Ferrous Sulfate (IRON PO) Take by mouth daily.   Yes [provider]  losartan-hydrochlorothiazide (HYZAAR) 100-25 MG tablet Take 0.5 tablets by mouth daily.   Yes [provider]  traZODone (DESYREL) 150 MG tablet Take 150 mg by mouth at bedtime.   Yes [provider]  acetaminophen (TYLENOL) 325 MG tablet Take 2 tablets (650 mg total) by mouth every 6 (six) hours as needed for mild pain (or Fever >/= 101). 03/13/20   Loletha Grayer, MD  famotidine (PEPCID) 20 MG tablet Take 20 mg by mouth 2 (two) times daily. 11/24/20   [provider]  predniSONE (DELTASONE) 20 MG tablet Take 20 mg by mouth daily. 01/18/21   [provider]  torsemide (DEMADEX) 10 MG tablet Take 10 mg by mouth daily as needed.    [provider]    Family History  Problem Relation Age of Onset  . Congestive Heart Failure Mother   . COPD Sister   . Asthma Sister   . Tuberculosis Maternal Grandmother   . Stroke Maternal Grandfather      Social History   Tobacco Use  . Smoking status: Former    Years: 40.00    Pack years: 0.00    Types: Cigarettes    Quit date: 08/19/1985    Years since quitting: 35.4  . Smokeless tobacco: Never  Vaping Use   . Vaping Use: Never used  Substance Use Topics  . Alcohol use: Yes    Alcohol/week: 14.0 standard drinks    Types: 14 Shots of liquor per week  . Drug use: No    Allergies as of 01/26/2021 - Review Complete 01/26/2021  Allergen Reaction Noted  . Penicillins Hives, Rash, and Other (See Comments) 03/28/2015  . Bee venom Palpitations 11/13/2013  . Darvon [propoxyphene] Nausea Only and Rash 03/28/2015    Review of Systems:    All systems reviewed and negative except where noted in HPI.   Physical Exam:  Vital signs in last 24 hours: Temp:  [97.4 F (36.3 C)-98.8 F (37.1 C)] 97.8 F (36.6 C) (06/11 0749) Pulse Rate:  [70-89] 89 (06/11 0749) Resp:  [16-19] 19 (06/11 0749) BP: (113-142)/(51-99) 136/98 (06/11 0749) SpO2:  [96 %-100 %] 100 % (06/11 0749) Weight:  [93.9 kg-95.3 kg] 93.9 kg (06/10 2332) Last BM Date: 01/26/21 General:   Pleasant, cooperative in NAD Head:  Normocephalic and atraumatic. Eyes:   No icterus.   Conjunctiva pink. PERRLA. Ears:  Normal auditory acuity. Neck:  Supple; no masses or thyroidomegaly Lungs: Respirations even and unlabored. Lungs clear to auscultation bilaterally.   No wheezes, crackles, or rhonchi.  Heart:  Regular rate and rhythm;  Without murmur, clicks, rubs or gallops Abdomen:  Soft, nondistended, nontender. Normal bowel sounds. No appreciable masses or hepatomegaly.  No rebound or guarding.  Rectal:  Not performed. Msk:  Symmetrical without gross deformities.    Extremities:  Without edema, cyanosis or clubbing. Neurologic:  Alert and oriented x3;  grossly normal neurologically. Skin:  Intact without significant lesions or rashes. Cervical Nodes:  No significant cervical adenopathy. Psych:  Alert and cooperative. Normal affect.  LAB RESULTS: Recent Labs    01/26/21 1411 01/26/21 2352 01/27/21 0410  WBC 15.6* 15.3* 10.0  HGB 9.0* 7.8* 6.7*  HCT 25.5* 22.2* 19.2*  PLT 198 180 144*   BMET Recent Labs    01/26/21 1411  01/27/21 0410  NA 135 136  K 4.2 4.3  CL 102 105  CO2 23 24  GLUCOSE 152* 122*  BUN 60* 68*  CREATININE 1.91* 1.86*  CALCIUM 8.6* 8.2*   LFT Recent Labs    01/26/21 1411  PROT 6.1*  ALBUMIN 3.7  AST 18  ALT 15  ALKPHOS 47  BILITOT 0.7   PT/INR No results for input(s): LABPROT, INR in the last 72 hours.  STUDIES: No results found.    Impression / Plan:   Assessment: Active Problems:   COPD (chronic obstructive pulmonary disease) ([facility])   Chronic diastolic CHF (congestive  heart failure) ([place])   Essential hypertension   GI bleed   Trevor Ferguson is a 87 y.o. y/o male with who comes in with black stools mixed with bright red blood with a drop in his hemoglobin from his baseline which was around 11 that is 6.7 this morning.  The patient has had no further sign of any GI bleeding since yesterday.  He denies any abdominal pain or NSAID use.  The patient also has not had any nausea or vomiting.  Plan:  This patient has a presentation consistent with a GI bleed.  I agree that it is likely an upper GI bleed with his history of melena and significant drop in hemoglobin.  The patient will be set up for an EGD for today.  The patient has been explained the plan and agrees with it.  Thank you for involving me in the care of this patient.      LOS: 1 day   Lucilla Lame, MD, [facility] 01/27/2021, 7:55 AM,  Pager [phone] 7am-5pm  Check AMION for 5pm -7am coverage and on weekends   Note: This dictation was prepared with Dragon dictation along with smaller phrase technology. Any transcriptional errors that result from this process are unintentional.

## 2021-01-27 NOTE — Op Note (Signed)
[facility] Gastroenterology Patient Name: Trevor Ferguson Procedure Date: 01/27/2021 9:35 AM MRN: [id number] Account #: [id number] Date of Birth: [birth year] Admit Type: Inpatient Age: 87 Room: [facility] ENDO ROOM 4 Gender: Male Note Status: Finalized Procedure:             Upper GI endoscopy Indications:           Melena Providers:             Lucilla Lame MD, MD Referring MD:          No Local Md, MD (Referring MD) Medicines:             Propofol per Anesthesia Complications:         No immediate complications. Procedure:             Pre-Anesthesia Assessment:                        - Prior to the procedure, a History and Physical was                         performed, and patient medications and allergies were                         reviewed. The patient's tolerance of previous                         anesthesia was also reviewed. The risks and benefits                         of the procedure and the sedation options and risks                         were discussed with the patient. All questions were                         answered, and informed consent was obtained. Prior                         Anticoagulants: The patient has taken no previous                         anticoagulant or antiplatelet agents. ASA Grade                         Assessment: III - A patient with severe systemic                         disease. After reviewing the risks and benefits, the                         patient was deemed in satisfactory condition to                         undergo the procedure.                        After obtaining informed consent, the endoscope was  passed under direct vision. Throughout the procedure,                         the patient's blood pressure, pulse, and oxygen                         saturations were monitored continuously. The Endoscope                         was introduced through the mouth, and advanced to the                          second part of duodenum. The upper GI endoscopy was                         accomplished without difficulty. The patient tolerated                         the procedure well. Findings:      A small hiatal hernia was present.      The stomach was normal.      The examined duodenum was normal. Impression:            - Small hiatal hernia.                        - Normal stomach.                        - Normal examined duodenum.                        - No specimens collected. Recommendation:        - Discharge patient to home.                        - Resume previous diet.                        - Continue present medications.                        - Perform a colonoscopy. Procedure Code(s):     --- Professional ---                        [phone], Esophagogastroduodenoscopy, flexible,                         transoral; diagnostic, including collection of                         specimen(s) by brushing or washing, when performed                         (separate procedure) Diagnosis Code(s):     --- Professional ---                        K92.1, Melena (includes Hematochezia) CPT copyright 2019 American Medical Association. All rights reserved. The codes documented in this report are preliminary and upon coder review may  be revised to meet current compliance requirements.  Lucilla Lame MD, MD 01/27/2021 9:50:10 AM This report has been signed electronically. Number of Addenda: 0 Note Initiated On: 01/27/2021 9:35 AM Estimated Blood Loss:  Estimated blood loss: none.      [facility]

## 2021-01-27 NOTE — Plan of Care (Signed)
  Problem: Education: Goal: Knowledge of General Education information will improve Description: Including pain rating scale, medication(s)/side effects and non-pharmacologic comfort measures Outcome: Progressing   Problem: Health Behavior/Discharge Planning: Goal: Ability to manage health-related needs will improve Outcome: Progressing   Problem: Activity: Goal: Risk for activity intolerance will decrease Outcome: Progressing   

## 2021-01-28 ENCOUNTER — Encounter
Admission: EM | Disposition: A | Discharge status: Home / Self Care | Payer: Self-pay | Source: Home / Self Care | Attending: Family Medicine

## 2021-01-28 ENCOUNTER — Inpatient Hospital Stay: Payer: Medicare PPO | Admitting: Anesthesiology

## 2021-01-28 ENCOUNTER — Encounter: Payer: Self-pay | Admitting: Family Medicine

## 2021-01-28 DIAGNOSIS — K635 Polyp of colon: Secondary | ICD-10-CM

## 2021-01-28 HISTORY — PX: COLONOSCOPY WITH PROPOFOL: SHX5780

## 2021-01-28 HISTORY — PX: GIVENS CAPSULE STUDY: SHX5432

## 2021-01-28 LAB — CBC
HCT: 21.1 % — ABNORMAL LOW (ref 39.0–52.0)
Hemoglobin: 7.4 g/dL — ABNORMAL LOW (ref 13.0–17.0)
MCH: 32.2 pg (ref 26.0–34.0)
MCHC: 35.1 g/dL (ref 30.0–36.0)
MCV: 91.7 fL (ref 80.0–100.0)
Platelets: 141 10*3/uL — ABNORMAL LOW (ref 150–400)
RBC: 2.3 MIL/uL — ABNORMAL LOW (ref 4.22–5.81)
RDW: 15.3 % (ref 11.5–15.5)
WBC: 8.4 10*3/uL (ref 4.0–10.5)
nRBC: 0 % (ref 0.0–0.2)

## 2021-01-28 LAB — HEMOGLOBIN AND HEMATOCRIT, BLOOD
HCT: 20.6 % — ABNORMAL LOW (ref 39.0–52.0)
Hemoglobin: 7.2 g/dL — ABNORMAL LOW (ref 13.0–17.0)

## 2021-01-28 SURGERY — IMAGING PROCEDURE, GI TRACT, INTRALUMINAL, VIA CAPSULE

## 2021-01-28 SURGERY — COLONOSCOPY WITH PROPOFOL
Anesthesia: General

## 2021-01-28 MED ORDER — SODIUM CHLORIDE 0.9 % IV SOLN: INTRAVENOUS | Status: DC | PRN

## 2021-01-28 MED ORDER — PHENOL 1.4 % MT LIQD
1.0000 | OROMUCOSAL | Status: DC | PRN
  Administered 2021-01-28: 22:00:00 1 via OROMUCOSAL
  Filled 2021-01-28: qty 177

## 2021-01-28 MED ORDER — LIDOCAINE HCL (CARDIAC) PF 100 MG/5ML IV SOSY
PREFILLED_SYRINGE | INTRAVENOUS | Status: DC | PRN
  Administered 2021-01-28: 100 mg via INTRAVENOUS

## 2021-01-28 MED ORDER — PROPOFOL 10 MG/ML IV BOLUS
INTRAVENOUS | Status: DC | PRN
  Administered 2021-01-28: 10 mg via INTRAVENOUS
  Administered 2021-01-28 (×4): 20 mg via INTRAVENOUS

## 2021-01-28 MED ORDER — PROPOFOL 500 MG/50ML IV EMUL
INTRAVENOUS | Status: DC | PRN
  Administered 2021-01-28: 75 ug/kg/min via INTRAVENOUS

## 2021-01-28 NOTE — Transfer of Care (Signed)
Immediate Anesthesia Transfer of Care Note  Patient: KADENCE MIMBS  Procedure(s) Performed: COLONOSCOPY WITH PROPOFOL  Patient Location: PACU  Anesthesia Type:General  Level of Consciousness: awake, drowsy and patient cooperative  Airway & Oxygen Therapy: Patient Spontanous Breathing and Patient connected to face mask oxygen  Post-op Assessment: Report given to RN and Post -op Vital signs reviewed and stable  Post vital signs: Reviewed and stable  Last Vitals:  Vitals Value Taken Time  BP    Temp    Pulse 79 01/28/21 1336  Resp 16 01/28/21 1336  SpO2 100 % 01/28/21 1336  Vitals shown include unvalidated device data.  Last Pain:  Vitals:   01/28/21 1249  TempSrc: Temporal  PainSc: 0-No pain         Complications: No notable events documented.

## 2021-01-28 NOTE — Evaluation (Signed)
Physical Therapy Evaluation Patient Details Name: MARDELL CRAGG MRN: [id number] DOB: October 18, 1925 Today's Date: 01/28/2021   History of Present Illness  87 y.o. M with obesity, COPD, dCHF on home O2, prostate CA, OSA not on CPAP, hx stroke, hx diverticulitis, hx EPEC diarrhea, and HTN who presented with melena.  Has had multiple dark/bloody stools since.  Clinical Impression  Pt eager to work with PT and ultimately did quite well.  He is not at his baseline and had some fatigue with circumambulation of the nurses' station but ultimately was safe and relatively confident with the effort.  He has not need O2 other than at night prior to this hospitalization, on 2L t/o the effort he generally stayed in the low 90s (occasionally dropping into the high 80s).  Pt reports she has a daughter the spends Mondays with him and that she will be able to do community errands, etc until he has had time to build his activity tolerance up with HHPT.  Safe to return home from PT perspective.    Follow Up Recommendations Home health PT    Equipment Recommendations  None recommended by PT    Recommendations for Other Services       Precautions / Restrictions Precautions Precautions: None Restrictions Weight Bearing Restrictions: No      Mobility  Bed Mobility Overal bed mobility: Modified Independent             General bed mobility comments: Pt easily gets to EOB, able to don socks while sitting    Transfers Overall transfer level: Modified independent Equipment used: Rolling [place] (2 wheeled)             General transfer comment: appropriate sequencing, UE use and confidence standing.  No safety concerns  Ambulation/Gait Ambulation/Gait assistance: Modified independent (Device/Increase time) Gait Distance (Feet): 200 Feet Assistive device: Rolling [place] (2 wheeled)       General Gait Details: Pt with confident and consistent cadence and effort.  On 2L O2 t/o with sats generally  staying in the low 90s.  Pt with no overt DOE but did endorse some fatigue with completion of the loop.  No LOBs or safety concerns.  Stairs            Wheelchair Mobility    Modified Rankin (Stroke Patients Only)       Balance Overall balance assessment: Modified Independent (with [place])                                           Pertinent Vitals/Pain Pain Assessment: No/denies pain    Home Living Family/patient expects to be discharged to:: Private residence Living Arrangements: Alone   Type of Home: Independent living facility Home Access: Level entry     Home Layout: One level Home Equipment: [place] - 4 wheels;Cane - single point;Shower seat;Grab bars - tub/shower;[place] - 2 wheels      Prior Function Level of Independence: Independent         Comments: Pt drives, runs errands, able to do prolonged bouts of ambulation with 4WW     Hand Dominance        Extremity/Trunk Assessment   Upper Extremity Assessment Upper Extremity Assessment: Generalized weakness;Overall [facility] for tasks assessed    Lower Extremity Assessment Lower Extremity Assessment: Generalized weakness;Overall [facility] for tasks assessed       Communication   Communication: No difficulties  Cognition Arousal/Alertness: Awake/alert Behavior During Therapy: WFL for tasks assessed/performed Overall Cognitive Status: Within Functional Limits for tasks assessed                                        General Comments      Exercises     Assessment/Plan    PT Assessment Patient needs continued PT services  PT Problem List Decreased strength;Decreased range of motion;Decreased activity tolerance;Cardiopulmonary status limiting activity       PT Treatment Interventions DME instruction;Gait training;Stair training;Functional mobility training;Therapeutic activities;Therapeutic exercise;Balance training;Cognitive remediation    PT Goals (Current goals  can be found in the Care Plan section)  Acute Rehab PT Goals Patient Stated Goal: get back to his normal activity tolerance PT Goal Formulation: With patient Time For Goal Achievement: 02/11/21 Potential to Achieve Goals: Good    Frequency Min 2X/week   Barriers to discharge        Co-evaluation               AM-PAC PT "6 Clicks" Mobility  Outcome Measure Help needed turning from your back to your side while in a flat bed without using bedrails?: None Help needed moving from lying on your back to sitting on the side of a flat bed without using bedrails?: None Help needed moving to and from a bed to a chair (including a wheelchair)?: None Help needed standing up from a chair using your arms (e.g., wheelchair or bedside chair)?: None Help needed to walk in hospital room?: None Help needed climbing 3-5 steps with a railing? : None 6 Click Score: 24    End of Session Equipment Utilized During Treatment: Gait belt;Oxygen (2L) Activity Tolerance: Patient tolerated treatment well;Patient limited by fatigue Patient left: with bed alarm set;with call bell/phone within reach Nurse Communication: Mobility status PT Visit Diagnosis: Muscle weakness (generalized) (M62.81);Difficulty in walking, not elsewhere classified (R26.2)    Time: 3546-5681 PT Time Calculation (min) (ACUTE ONLY): 25 min   Charges:   PT Evaluation $PT Eval Low Complexity: 1 Low PT Treatments $Gait Training: 8-22 mins        Kreg Shropshire, DPT 01/28/2021, 12:08 PM

## 2021-01-28 NOTE — Anesthesia Procedure Notes (Signed)
Procedure Name: General with mask airway Date/Time: 01/28/2021 1:06 PM Performed by: Kelton Pillar, CRNA Pre-anesthesia Checklist: Patient identified, Emergency Drugs available, Suction available and Patient being monitored Patient Re-evaluated:Patient Re-evaluated prior to induction Oxygen Delivery Method: Simple face mask Induction Type: IV induction Placement Confirmation: positive ETCO2 and CO2 detector Dental Injury: Teeth and Oropharynx as per pre-operative assessment

## 2021-01-28 NOTE — Progress Notes (Signed)
[facility] PROGRESS NOTE    Trevor Ferguson  XBL:[id number] DOB: 06-13-1926 DOA: 01/26/2021 PCP: Juluis Pitch, MD      Brief Narrative:  Trevor Ferguson is a 87 y.o. M with obesity, COPD, dCHF on home O2, prostate CA, OSA not on CPAP, hx stroke, hx diverticulitis, hx EPEC diarrhea, and HTN who presented with melena for 1 day.  Patient was in his usual state of health until day of admission when he had some fecal incontinence and black stool mixed with red blood, so he came to the ER.  In the ER, hemoglobin 9 g/dL he had another black stool.  Started on IV PPI, IV fluids and admitted to the hospital hemodynamically stable.  Denies NSAIDs.  Not on anticoagulation.           Assessment & Plan:  Acute GI bleed Initially thought to be upper, due to melanotic character.  EGD 6/11 however was unremarkable  Overnight, completed bowel prep, stools still black, mixed with red.   - Consult gastroenterology, they plan for colonoscopy tomorrow, appreciate expert cares     Acute blood loss anemia Hemoglobin at his PCP 1 month ago was greater than 12 g/dL.  Here, his Hgb dropped to 6.7, transfused 1 unit 6/11, Hgb up to 8.4 after.  Asymptomatic, but still with what sounds like red blood occasionally in stool ovenright and Hgb trending down  - Await colonoscopy findings - Trend Hgb - Without active cardiac symptoms, I will reserve transfusion for threshold 7 g/dL  Chronic diastolic CHF Hypertension BP normal, appears euvolemic - Continue amlodipine, HCTZ - Hold ARB for now - Resume baby aspirin, in case this is ischemic colitis  Chronic hypoxic respiratory failure Due to CHF and COPD  COPD No symptoms - As needed bronchodilators - Hold home azithromycin - Continue Pulmicort  Chronic kidney disease, stage IIIb Creatinine stable relative to baseline  Insomnia, mood - Continue home trazodone, escitalopram               Disposition: Status  is: Inpatient  Remains inpatient appropriate because:   Hgb trending down, colonoscopy pending, still clinical bleeding overnight rectally.        Dispo: The patient is from: Home              Anticipated d/c is to: Home with [facility]              Patient currently is not medically stable to d/c.   Difficult to place patient No       Level of care: Med-Surg       MDM: The below labs and imaging reports were reviewed and summarized above.  Medication management as above.    DVT prophylaxis: SCDs Start: 01/26/21 1843  Code Status: DNR Family Communication:      Consultants:  Gastroenterology  Procedures:  6/11 EGD: Normal esophagus, stomach without ulcers or gastritis or bleeding, normal duodenum          Subjective: Overnight some black stools, also some occasional red blood appeared to be mixed in stools.  No dizziness, no dyspnea, no chest pain, no confusion.        Objective: Vitals:   01/27/21 2341 01/28/21 0437 01/28/21 0731 01/28/21 0821  BP: (!) 136/55 (!) 131/57 (!) 129/55   Pulse: 71 75 76   Resp:  18 18   Temp: 97.6 F (36.4 C) (!) 97.5 F (36.4 C) 98 F (36.7 C)   TempSrc: Oral Oral  SpO2: 99% 97% 97% 98%  Weight:      Height:        Intake/Output Summary (Last 24 hours) at 01/28/2021 4259 Last data filed at 01/28/2021 0210 Gross per 24 hour  Intake 1070 ml  Output 3500 ml  Net -2430 ml   Filed Weights   01/26/21 1409 01/26/21 2332  Weight: 95.3 kg 93.9 kg    Examination: General appearance: Elderly adult male, alert and in no acute distress, sitting up in bed, transferring back to bed independently after working with PT     HEENT:    Skin:  Cardiac: RRR, soft systolic murmur, JVP not visible, mild pitting lower extremity edema Respiratory: Normal respiratory rate and rhythm, lungs clear without rales or wheezes Abdomen: Abdomen soft without tenderness palpation or guarding, no ascites or distention MSK:  Neuro: Awake and  alert, extraocular movements intact, moves all extremities with generalized weakness but symmetric strength, speech fluent Psych: Sensorium intact and responding to questions, attention normal, affect pleasant, judgment insight appear normal        Data Reviewed: I have personally reviewed following labs and imaging studies:  CBC: Recent Labs  Lab 01/26/21 1411 01/26/21 2352 01/27/21 0410 01/27/21 1828 01/28/21 0520  WBC 15.6* 15.3* 10.0  --  8.4  HGB 9.0* 7.8* 6.7* 8.4* 7.4*  HCT 25.5* 22.2* 19.2* 23.6* 21.1*  MCV 91.1 92.9 94.6  --  91.7  PLT 198 180 144*  --  563*   Basic Metabolic Panel: Recent Labs  Lab 01/26/21 1411 01/27/21 0410  NA 135 136  K 4.2 4.3  CL 102 105  CO2 23 24  GLUCOSE 152* 122*  BUN 60* 68*  CREATININE 1.91* 1.86*  CALCIUM 8.6* 8.2*   GFR: Estimated Creatinine Clearance: 28.7 mL/min (A) (by C-G formula based on SCr of 1.86 mg/dL (H)). Liver Function Tests: Recent Labs  Lab 01/26/21 1411  AST 18  ALT 15  ALKPHOS 47  BILITOT 0.7  PROT 6.1*  ALBUMIN 3.7   No results for input(s): LIPASE, AMYLASE in the last 168 hours. No results for input(s): AMMONIA in the last 168 hours. Coagulation Profile: No results for input(s): INR, PROTIME in the last 168 hours. Cardiac Enzymes: No results for input(s): CKTOTAL, CKMB, CKMBINDEX, TROPONINI in the last 168 hours. BNP (last 3 results) No results for input(s): PROBNP in the last 8760 hours. HbA1C: No results for input(s): HGBA1C in the last 72 hours. CBG: No results for input(s): GLUCAP in the last 168 hours. Lipid Profile: No results for input(s): CHOL, HDL, LDLCALC, TRIG, CHOLHDL, LDLDIRECT in the last 72 hours. Thyroid Function Tests: No results for input(s): TSH, T4TOTAL, FREET4, T3FREE, THYROIDAB in the last 72 hours. Anemia Panel: No results for input(s): VITAMINB12, FOLATE, FERRITIN, TIBC, IRON, RETICCTPCT in the last 72 hours. Urine analysis:    Component Value Date/Time    COLORURINE YELLOW (A) 03/06/2020 2130   APPEARANCEUR CLEAR (A) 03/06/2020 2130   APPEARANCEUR Clear 03/19/2014 1049   LABSPEC 1.014 03/06/2020 2130   LABSPEC 1.016 03/19/2014 1049   PHURINE 5.0 03/06/2020 2130   GLUCOSEU NEGATIVE 03/06/2020 2130   GLUCOSEU Negative 03/19/2014 1049   HGBUR NEGATIVE 03/06/2020 2130   BILIRUBINUR NEGATIVE 03/06/2020 2130   BILIRUBINUR Negative 03/19/2014 [place] 03/06/2020 2130   PROTEINUR 30 (A) 03/06/2020 2130   NITRITE NEGATIVE 03/06/2020 2130   LEUKOCYTESUR TRACE (A) 03/06/2020 2130   LEUKOCYTESUR Negative 03/19/2014 1049   Sepsis Labs: @LABRCNTIP (procalcitonin:4,lacticacidven:4)  ) Recent Results (from  the past 240 hour(s))  Resp Panel by RT-PCR (Flu A&B, Covid) Nasopharyngeal Swab     Status: None   Collection Time: 01/26/21  5:01 PM   Specimen: Nasopharyngeal Swab; Nasopharyngeal(NP) swabs in vial transport medium  Result Value Ref Range Status   SARS Coronavirus 2 by RT PCR NEGATIVE NEGATIVE Final    Comment: (NOTE) SARS-CoV-2 target nucleic acids are NOT DETECTED.  The SARS-CoV-2 RNA is generally detectable in upper respiratory specimens during the acute phase of infection. The lowest concentration of SARS-CoV-2 viral copies this assay can detect is 138 copies/mL. A negative result does not preclude SARS-Cov-2 infection and should not be used as the sole basis for treatment or other patient management decisions. A negative result may occur with  improper specimen collection/handling, submission of specimen other than nasopharyngeal swab, presence of viral mutation(s) within the areas targeted by this assay, and inadequate number of viral copies(<138 copies/mL). A negative result must be combined with clinical observations, patient history, and epidemiological information. The expected result is Negative.  Fact Sheet for Patients:  EntrepreneurPulse.com.au  Fact Sheet for Healthcare Providers:   IncredibleEmployment.be  This test is no t yet approved or cleared by the [place] FDA and  has been authorized for detection and/or diagnosis of SARS-CoV-2 by FDA under an Emergency Use Authorization (EUA). This EUA will remain  in effect (meaning this test can be used) for the duration of the COVID-19 declaration under Section 564(b)(1) of the Act, 21 U.S.C.section 360bbb-3(b)(1), unless the authorization is terminated  or revoked sooner.       Influenza A by PCR NEGATIVE NEGATIVE Final   Influenza B by PCR NEGATIVE NEGATIVE Final    Comment: (NOTE) The Xpert Xpress SARS-CoV-2/FLU/RSV plus assay is intended as an aid in the diagnosis of influenza from Nasopharyngeal swab specimens and should not be used as a sole basis for treatment. Nasal washings and aspirates are unacceptable for Xpert Xpress SARS-CoV-2/FLU/RSV testing.  Fact Sheet for Patients: EntrepreneurPulse.com.au  Fact Sheet for Healthcare Providers: IncredibleEmployment.be  This test is not yet approved or cleared by the [place] FDA and has been authorized for detection and/or diagnosis of SARS-CoV-2 by FDA under an Emergency Use Authorization (EUA). This EUA will remain in effect (meaning this test can be used) for the duration of the COVID-19 declaration under Section 564(b)(1) of the Act, 21 U.S.C. section 360bbb-3(b)(1), unless the authorization is terminated or revoked.  Performed at [facility], [address] [place], [place] [address]          Radiology Studies: No results found.      Scheduled Meds:  acidophilus  2 capsule Oral TID   amLODipine  5 mg Oral Daily   budesonide  0.5 mg Nebulization Daily   cholecalciferol  1,000 Units Oral Daily   escitalopram  10 mg Oral Daily   hydrochlorothiazide  25 mg Oral Daily   traZODone  150 mg Oral QHS   Continuous Infusions:  methocarbamol (ROBAXIN) IV       LOS: 2  days    Time spent: 25  minutes    Edwin Dada, MD [facility] 01/28/2021, 9:23 AM     Please page though [place] or Epic secure chat:  For [employer], Adult nurse

## 2021-01-28 NOTE — Op Note (Signed)
[facility] Patient Name: Trevor Ferguson Procedure Date: 01/28/2021 11:54 AM MRN: [id number] Account #: [id number] Date of Birth: 1926-05-30 Admit Type: Inpatient Age: 87 Room: [facility] ENDO ROOM 4 Gender: Male Note Status: Finalized Procedure:             Colonoscopy Indications:           Hematochezia Providers:             Lucilla Lame MD, MD Referring MD:          Youlanda Roys. Lovie Macadamia, MD (Referring MD) Medicines:             Propofol per Anesthesia Complications:         No immediate complications. Procedure:             Pre-Anesthesia Assessment:                        - Prior to the procedure, a History and Physical was                         performed, and patient medications and allergies were                         reviewed. The patient's tolerance of previous                         anesthesia was also reviewed. The risks and benefits                         of the procedure and the sedation options and risks                         were discussed with the patient. All questions were                         answered, and informed consent was obtained. Prior                         Anticoagulants: The patient has taken no previous                         anticoagulant or antiplatelet agents. ASA Grade                         Assessment: III - A patient with severe systemic                         disease. After reviewing the risks and benefits, the                         patient was deemed in satisfactory condition to                         undergo the procedure.                        After obtaining informed consent, the colonoscope was  passed under direct vision. Throughout the procedure,                         the patient's blood pressure, pulse, and oxygen                         saturations were monitored continuously. The                         Colonoscope was introduced through the anus and                          advanced to the the terminal ileum. The colonoscopy                         was performed without difficulty. The patient                         tolerated the procedure well. The quality of the bowel                         preparation was good. Findings:      The perianal and digital rectal examinations were normal.      A 6 mm polyp was found in the sigmoid colon. The polyp was pedunculated.       The polyp was removed with a cold snare. Resection and retrieval were       complete. To prevent bleeding after the polypectomy, one hemostatic clip       was successfully placed (MR conditional). There was no bleeding at the       end of the procedure.      Non-bleeding internal hemorrhoids were found during retroflexion. The       hemorrhoids were Grade II (internal hemorrhoids that prolapse but reduce       spontaneously).      The terminal ileum contained hematin (altered blood/coffee-ground-like       material).      Hematin (altered blood/coffee-ground-like material) was found in the       entire colon. Impression:            - One 6 mm polyp in the sigmoid colon, removed with a                         cold snare. Resected and retrieved. Clip (MR                         conditional) was placed.                        - Non-bleeding internal hemorrhoids.                        - Blood in the terminal ileum.                        - Blood in the entire examined colon. Recommendation:        - Return patient to hospital ward for ongoing care.                        -  Clear liquid diet.                        - Continue present medications.                        - To visualize the small bowel, perform video capsule                         endoscopy. Procedure Code(s):     --- Professional ---                        [phone], Colonoscopy, flexible; with removal of                         tumor(s), polyp(s), or other lesion(s) by snare                         technique Diagnosis  Code(s):     --- Professional ---                        K92.1, Melena (includes Hematochezia)                        K92.2, Gastrointestinal hemorrhage, unspecified                        K63.5, Polyp of colon CPT copyright 2019 American Medical Association. All rights reserved. The codes documented in this report are preliminary and upon coder review may  be revised to meet current compliance requirements. Lucilla Lame MD, MD 01/28/2021 1:29:24 PM This report has been signed electronically. Number of Addenda: 0 Note Initiated On: 01/28/2021 11:54 AM Scope Withdrawal Time: 0 hours 8 minutes 42 seconds  Total Procedure Duration: 0 hours 14 minutes 39 seconds  Estimated Blood Loss:  Estimated blood loss: none.      [facility]

## 2021-01-28 NOTE — Anesthesia Preprocedure Evaluation (Addendum)
Anesthesia Evaluation  Patient identified by MRN, date of birth, ID band Patient awake    Reviewed: Allergy & Precautions, NPO status , Patient's Chart, lab work & pertinent test results  History of Anesthesia Complications Negative for: history of anesthetic complications  Airway Mallampati: III  TM Distance: >3 FB Neck ROM: Full    Dental  (+) Poor Dentition   Pulmonary sleep apnea , COPD,  COPD inhaler, Patient abstained from smoking.Not current smoker, former smoker,  Breathing feels at baseline today    + decreased breath sounds      Cardiovascular Exercise Tolerance: Good METShypertension, +CHF  (-) CAD and (-) Past MI (-) dysrhythmias  Rhythm:Regular Rate:Normal - Systolic murmurs 2426 TTE: 1. Left ventricular ejection fraction, by estimation, is 60 to 65%. The  left ventricle has normal function. The left ventricle has no regional  wall motion abnormalities. Left ventricular diastolic parameters were  normal.  2. Right ventricular systolic function is normal. The right ventricular  size is normal. There is severely elevated pulmonary artery systolic  pressure.  3. The mitral valve is normal in structure. Trivial mitral valve  regurgitation. No evidence of mitral stenosis.  4. The aortic valve is normal in structure. Aortic valve regurgitation is  not visualized. No aortic stenosis is present.  5. The inferior vena cava is normal in size with greater than 50%  respiratory variability, suggesting right atrial pressure of 3 mmHg.   Neuro/Psych PSYCHIATRIC DISORDERS Depression CVA, No Residual Symptoms    GI/Hepatic neg GERD  ,(+)     (-) substance abuse  ,   Endo/Other  neg diabetes  Renal/GU CRFRenal disease     Musculoskeletal   Abdominal   Peds  Hematology  (+) anemia ,   Anesthesia Other Findings Past Medical History: No date: COPD (chronic obstructive pulmonary disease) ([place]) No date:  Hypertension No date: Pneumonia     Comment:  2012,1985 hospitalized No date: Prostate cancer ([place])     Comment:  with seed placement No date: Sleep apnea     Comment:  noncompliant, dx 1996 2012: Stroke [facility])     Comment:  no residuals  Reproductive/Obstetrics                                                             Anesthesia Evaluation  Patient identified by MRN, date of birth, ID band Patient awake    Reviewed: Allergy & Precautions, NPO status , Patient's Chart, lab work & pertinent test results  History of Anesthesia Complications Negative for: history of anesthetic complications  Airway Mallampati: II  TM Distance: >3 FB Neck ROM: Full    Dental no notable dental hx. (+) Teeth Intact   Pulmonary neg pulmonary ROS, sleep apnea and Continuous Positive Airway Pressure Ventilation , pneumonia, resolved, COPD (Uses nebs daily),  COPD inhaler, former smoker,    Pulmonary exam normal breath sounds clear to auscultation       Cardiovascular Exercise Tolerance: Poor hypertension, Pt. on medications +CHF  negative cardio ROS Normal cardiovascular exam Rhythm:Regular Rate:Normal     Neuro/Psych PSYCHIATRIC DISORDERS Depression CVA negative neurological ROS  negative psych ROS   GI/Hepatic negative GI ROS, Neg liver ROS,   Endo/Other  negative endocrine ROS  Renal/GU Renal InsufficiencyRenal diseasenegative Renal ROS  negative genitourinary  Musculoskeletal negative musculoskeletal ROS (+)   Abdominal   Peds negative pediatric ROS (+)  Hematology negative hematology ROS (+) anemia ,   Anesthesia Other Findings   Reproductive/Obstetrics negative OB ROS                             Anesthesia Physical Anesthesia Plan  ASA: 3  Anesthesia Plan: MAC   Post-op Pain Management:    Induction: Intravenous  PONV Risk Score and Plan:   Airway Management Planned: Natural Airway and Nasal  Cannula  Additional Equipment:   Intra-op Plan:   Post-operative Plan:   Informed Consent: I have reviewed the patients History and Physical, chart, labs and discussed the procedure including the risks, benefits and alternatives for the proposed anesthesia with the patient or authorized representative who has indicated his/her understanding and acceptance.     Dental Advisory Given  Plan Discussed with: Anesthesiologist, CRNA and Surgeon  Anesthesia Plan Comments: (Patient consented for risks of anesthesia including but not limited to:  - adverse reactions to medications - damage to eyes, teeth, lips or other oral mucosa - nerve damage due to positioning  - sore throat or hoarseness - Damage to heart, brain, nerves, lungs, other parts of body or loss of life  Patient voiced understanding.)        Anesthesia Quick Evaluation  Anesthesia Physical Anesthesia Plan  ASA: 3  Anesthesia Plan: General   Post-op Pain Management:    Induction: Intravenous  PONV Risk Score and Plan: 2 and Ondansetron, Propofol infusion and TIVA  Airway Management Planned: Nasal Cannula  Additional Equipment: None  Intra-op Plan:   Post-operative Plan:   Informed Consent: I have reviewed the patients History and Physical, chart, labs and discussed the procedure including the risks, benefits and alternatives for the proposed anesthesia with the patient or authorized representative who has indicated his/her understanding and acceptance.   Patient has DNR.  Discussed DNR with patient and Suspend DNR.   Dental advisory given  Plan Discussed with: CRNA and Surgeon  Anesthesia Plan Comments: (Discussed risks of anesthesia with patient, including possibility of difficulty with spontaneous ventilation under anesthesia necessitating airway intervention, PONV, and rare risks such as cardiac or respiratory or neurological events. Discussed DNR status; patient wishes to suspend it  perioperatively. Patient understands.)        Anesthesia Quick Evaluation

## 2021-01-28 NOTE — Progress Notes (Signed)
Patient out of room for procedure Attempted call to spouse and daughter for high risk screening and to discuss PT recs. Left VM for daughter Juliann Pulse requesting a return call.  Oleh Genin, [place]

## 2021-01-28 NOTE — Anesthesia Postprocedure Evaluation (Signed)
Anesthesia Post Note  Patient: Trevor Ferguson  Procedure(s) Performed: COLONOSCOPY WITH PROPOFOL  Patient location during evaluation: PACU Anesthesia Type: General Level of consciousness: awake and alert Pain management: pain level controlled Vital Signs Assessment: post-procedure vital signs reviewed and stable Respiratory status: spontaneous breathing, nonlabored ventilation, respiratory function stable and patient connected to nasal cannula oxygen Cardiovascular status: blood pressure returned to baseline and stable Postop Assessment: no apparent nausea or vomiting Anesthetic complications: no   No notable events documented.   Last Vitals:  Vitals:   01/28/21 1348 01/28/21 1358  BP:  127/70  Pulse: 75 73  Resp: 16 18  Temp:  36.8 C  SpO2: 97% 98%    Last Pain:  Vitals:   01/28/21 1358  TempSrc:   PainSc: 0-No pain                 Arita Miss

## 2021-01-29 ENCOUNTER — Encounter: Payer: Self-pay | Admitting: Gastroenterology

## 2021-01-29 LAB — BASIC METABOLIC PANEL
Anion gap: 4 — ABNORMAL LOW (ref 5–15)
BUN: 40 mg/dL — ABNORMAL HIGH (ref 8–23)
CO2: 26 mmol/L (ref 22–32)
Calcium: 8.3 mg/dL — ABNORMAL LOW (ref 8.9–10.3)
Chloride: 106 mmol/L (ref 98–111)
Creatinine, Ser: 1.81 mg/dL — ABNORMAL HIGH (ref 0.61–1.24)
GFR, Estimated: 35 mL/min — ABNORMAL LOW (ref >60)
Glucose, Bld: 104 mg/dL — ABNORMAL HIGH (ref 70–99)
Potassium: 3.9 mmol/L (ref 3.5–5.1)
Sodium: 136 mmol/L (ref 135–145)

## 2021-01-29 LAB — CBC
HCT: 19.6 % — ABNORMAL LOW (ref 39.0–52.0)
Hemoglobin: 6.8 g/dL — ABNORMAL LOW (ref 13.0–17.0)
MCH: 31.8 pg (ref 26.0–34.0)
MCHC: 34.7 g/dL (ref 30.0–36.0)
MCV: 91.6 fL (ref 80.0–100.0)
Platelets: 153 10*3/uL (ref 150–400)
RBC: 2.14 MIL/uL — ABNORMAL LOW (ref 4.22–5.81)
RDW: 14.9 % (ref 11.5–15.5)
WBC: 10.3 10*3/uL (ref 4.0–10.5)
nRBC: 0 % (ref 0.0–0.2)

## 2021-01-29 LAB — HEMOGLOBIN AND HEMATOCRIT, BLOOD
HCT: 23.2 % — ABNORMAL LOW (ref 39.0–52.0)
Hemoglobin: 8.3 g/dL — ABNORMAL LOW (ref 13.0–17.0)

## 2021-01-29 LAB — PREPARE RBC (CROSSMATCH)

## 2021-01-29 MED ORDER — SODIUM CHLORIDE 0.9% IV SOLUTION: Freq: Once | INTRAVENOUS | Status: AC

## 2021-01-29 MED ORDER — POLYETHYLENE GLYCOL 3350 17 GM/SCOOP PO POWD
0.5000 | Freq: Once | ORAL | Status: AC
  Administered 2021-01-29: 127.5 g via ORAL
  Filled 2021-01-29: qty 255

## 2021-01-29 MED ORDER — POLYETHYLENE GLYCOL 3350 17 GM/SCOOP PO POWD: 1.0000 | Freq: Once | ORAL | Status: DC

## 2021-01-29 NOTE — Progress Notes (Signed)
[facility] PROGRESS NOTE    CHIPPER KOUDELKA  IPJ:[id number] DOB: 01-Mar-1926 DOA: 01/26/2021 PCP: Juluis Pitch, MD      Brief Narrative:  [name] is a 87 y.o. M with obesity, COPD, dCHF on home O2, prostate CA, OSA not on CPAP, hx stroke, hx diverticulitis, hx EPEC diarrhea, and HTN who presented with melena for 1 day.  Patient was in his usual state of health until day of admission when he had some fecal incontinence and black stool mixed with red blood, so he came to the ER.  In the ER, hemoglobin 9 g/dL he had another black stool.  Started on IV PPI, IV fluids and admitted to the hospital hemodynamically stable.  Denies NSAIDs.  Not on anticoagulation.           Assessment & Plan:  Acute GI bleed, small bowel source Initially thought to be upper, due to melanotic character.  EGD 6/11 however was unremarkable, then colonoscopy 6/12 showed blood thorughout the colon and terminal ileum.  Capsule placed evening 6/12  No BM this morning.       - Consult gastroenterology, appreciate expert cares     Acute blood loss anemia Baseline Hgb 12 g/dL.  Hgb 9 g/dL on admisison Still trending down  Transfused 1 unit 6/12  - Transfuse 2nd unit 6/13  - Trend Hgb - Without active cardiac symptoms, I will reserve transfusion for threshold 7 g/dL    Chronic diastolic CHF Hypertension Low diastolic, systolic normal, appears euvolemic - Continue amlodipine, HCTZ - Hold ARB for now - Hold aspirin  Chronic hypoxic respiratory failure Due to CHF and COPD, on 2L usually just at night  COPD  Asymptomatic - As needed bronchodilators - Hold home azithromycin - Continue Pulmicort  Chronic kidney disease, stage IIIb Cr stable relative to baseline  Insomina, mood -Continue home trazodone, escitalopram               Disposition: Status is: Inpatient  Remains inpatient appropriate because: The patient has ongoing bleeding requiring  repeat blood transfusion today, ongoing capsule endoscopy to determine the source of his bleeding     Dispo: The patient is from: Home              Anticipated d/c is to: [facility] with [facility]              Patient currently is not medically stable to d/c.   Difficult to place patient No       Level of care: Med-Surg       MDM: The below labs and imaging reports were reviewed and summarized above.  Medication management as above.    DVT prophylaxis: SCDs Start: 01/26/21 1843  Code Status: DNR Family Communication: Daughter    Consultants:  Gastroenterology  Procedures:  6/11 EGD: Normal esophagus, stomach without ulcers or gastritis or bleeding, normal duodenum 6/12 Colonoscopy: polyp, bood throughtout colon and terminal ielum 6/12 capsule endoscopy, pending          Subjective: Woke up many times overnight, cannot remember if he had a bowel movement in the last 24 hours.  No dizziness, dyspnea, chest pain, confusion.         Objective: Vitals:   01/29/21 0342 01/29/21 0700 01/29/21 1045 01/29/21 1115  BP: (!) 130/56 (!) 111/51 (!) 143/49 (!) 136/57  Pulse: 70 68 61 66  Resp: 18 18 18 18   Temp: 97.7 F (36.5 C) 97.8 F (36.6 C) 97.8 F (36.6 C) 98.1  F (36.7 C)  TempSrc:   Oral Oral  SpO2: 90% 91% 98% 99%  Weight:      Height:        Intake/Output Summary (Last 24 hours) at 01/29/2021 1217 Last data filed at 01/29/2021 0930 Gross per 24 hour  Intake 1590 ml  Output --  Net 1590 ml   Filed Weights   01/26/21 1409 01/26/21 2332 01/28/21 1249  Weight: 95.3 kg 93.9 kg 95.3 kg    Examination: General appearance: Elderly adult male, sleeping but easily arousable, no acute distress, interactive     HEENT: Nasal cannula in place, sclera anicteric, conjunctive are pink, lids and lashes normal.  No nasal deformity, discharge, or epistaxis moist, no oral lesions Skin: mild pallor Cardiac: Regular rhythm, soft systolic murmur, JVP not visible, no  significant lower extremity edema Respiratory: Normal respiratory rate and rhythm, lungs clear without rales or wheezes Abdomen: Abdomen soft no tenderness palpation or guarding or distention MSK:  Neuro:    Psych: Sensorium intact and responds to questions appropriately, attention normal, affect pleasant, judgment and insight normal       Data Reviewed: I have personally reviewed following labs and imaging studies:  CBC: Recent Labs  Lab 01/26/21 1411 01/26/21 2352 01/27/21 0410 01/27/21 1828 01/28/21 0520 01/28/21 1619 01/29/21 0529  WBC 15.6* 15.3* 10.0  --  8.4  --  10.3  HGB 9.0* 7.8* 6.7* 8.4* 7.4* 7.2* 6.8*  HCT 25.5* 22.2* 19.2* 23.6* 21.1* 20.6* 19.6*  MCV 91.1 92.9 94.6  --  91.7  --  91.6  PLT 198 180 144*  --  141*  --  176   Basic Metabolic Panel: Recent Labs  Lab 01/26/21 1411 01/27/21 0410 01/29/21 0529  NA 135 136 136  K 4.2 4.3 3.9  CL 102 105 106  CO2 23 24 26   GLUCOSE 152* 122* 104*  BUN 60* 68* 40*  CREATININE 1.91* 1.86* 1.81*  CALCIUM 8.6* 8.2* 8.3*   GFR: Estimated Creatinine Clearance: 29.2 mL/min (A) (by C-G formula based on SCr of 1.81 mg/dL (H)). Liver Function Tests: Recent Labs  Lab 01/26/21 1411  AST 18  ALT 15  ALKPHOS 47  BILITOT 0.7  PROT 6.1*  ALBUMIN 3.7   No results for input(s): LIPASE, AMYLASE in the last 168 hours. No results for input(s): AMMONIA in the last 168 hours. Coagulation Profile: No results for input(s): INR, PROTIME in the last 168 hours. Cardiac Enzymes: No results for input(s): CKTOTAL, CKMB, CKMBINDEX, TROPONINI in the last 168 hours. BNP (last 3 results) No results for input(s): PROBNP in the last 8760 hours. HbA1C: No results for input(s): HGBA1C in the last 72 hours. CBG: No results for input(s): GLUCAP in the last 168 hours. Lipid Profile: No results for input(s): CHOL, HDL, LDLCALC, TRIG, CHOLHDL, LDLDIRECT in the last 72 hours. Thyroid Function Tests: No results for input(s): TSH,  T4TOTAL, FREET4, T3FREE, THYROIDAB in the last 72 hours. Anemia Panel: No results for input(s): VITAMINB12, FOLATE, FERRITIN, TIBC, IRON, RETICCTPCT in the last 72 hours. Urine analysis:    Component Value Date/Time   COLORURINE YELLOW (A) 03/06/2020 2130   APPEARANCEUR CLEAR (A) 03/06/2020 2130   APPEARANCEUR Clear 03/19/2014 1049   LABSPEC 1.014 03/06/2020 2130   LABSPEC 1.016 03/19/2014 1049   PHURINE 5.0 03/06/2020 2130   GLUCOSEU NEGATIVE 03/06/2020 2130   GLUCOSEU Negative 03/19/2014 1049   HGBUR NEGATIVE 03/06/2020 2130   BILIRUBINUR NEGATIVE 03/06/2020 2130   BILIRUBINUR Negative 03/19/2014 1049   KETONESUR  NEGATIVE 03/06/2020 2130   PROTEINUR 30 (A) 03/06/2020 2130   NITRITE NEGATIVE 03/06/2020 2130   LEUKOCYTESUR TRACE (A) 03/06/2020 2130   LEUKOCYTESUR Negative 03/19/2014 1049   Sepsis Labs: @LABRCNTIP (procalcitonin:4,lacticacidven:4)  ) Recent Results (from the past 240 hour(s))  Resp Panel by RT-PCR (Flu A&B, Covid) Nasopharyngeal Swab     Status: None   Collection Time: 01/26/21  5:01 PM   Specimen: Nasopharyngeal Swab; Nasopharyngeal(NP) swabs in vial transport medium  Result Value Ref Range Status   SARS Coronavirus 2 by RT PCR NEGATIVE NEGATIVE Final    Comment: (NOTE) SARS-CoV-2 target nucleic acids are NOT DETECTED.  The SARS-CoV-2 RNA is generally detectable in upper respiratory specimens during the acute phase of infection. The lowest concentration of SARS-CoV-2 viral copies this assay can detect is 138 copies/mL. A negative result does not preclude SARS-Cov-2 infection and should not be used as the sole basis for treatment or other patient management decisions. A negative result may occur with  improper specimen collection/handling, submission of specimen other than nasopharyngeal swab, presence of viral mutation(s) within the areas targeted by this assay, and inadequate number of viral copies(<138 copies/mL). A negative result must be combined  with clinical observations, patient history, and epidemiological information. The expected result is Negative.  Fact Sheet for Patients:  EntrepreneurPulse.com.au  Fact Sheet for Healthcare Providers:  IncredibleEmployment.be  This test is no t yet approved or cleared by the [place] FDA and  has been authorized for detection and/or diagnosis of SARS-CoV-2 by FDA under an Emergency Use Authorization (EUA). This EUA will remain  in effect (meaning this test can be used) for the duration of the COVID-19 declaration under Section 564(b)(1) of the Act, 21 U.S.C.section 360bbb-3(b)(1), unless the authorization is terminated  or revoked sooner.       Influenza A by PCR NEGATIVE NEGATIVE Final   Influenza B by PCR NEGATIVE NEGATIVE Final    Comment: (NOTE) The Xpert Xpress SARS-CoV-2/FLU/RSV plus assay is intended as an aid in the diagnosis of influenza from Nasopharyngeal swab specimens and should not be used as a sole basis for treatment. Nasal washings and aspirates are unacceptable for Xpert Xpress SARS-CoV-2/FLU/RSV testing.  Fact Sheet for Patients: EntrepreneurPulse.com.au  Fact Sheet for Healthcare Providers: IncredibleEmployment.be  This test is not yet approved or cleared by the [place] FDA and has been authorized for detection and/or diagnosis of SARS-CoV-2 by FDA under an Emergency Use Authorization (EUA). This EUA will remain in effect (meaning this test can be used) for the duration of the COVID-19 declaration under Section 564(b)(1) of the Act, 21 U.S.C. section 360bbb-3(b)(1), unless the authorization is terminated or revoked.  Performed at [facility], [address], [place],  [address]          Radiology Studies: No results found.      Scheduled Meds:  acidophilus  2 capsule Oral TID   amLODipine  5 mg Oral Daily   budesonide  0.5 mg Nebulization  Daily   cholecalciferol  1,000 Units Oral Daily   escitalopram  10 mg Oral Daily   hydrochlorothiazide  25 mg Oral Daily   traZODone  150 mg Oral QHS   Continuous Infusions:  methocarbamol (ROBAXIN) IV       LOS: 3 days    Time spent: 25 minutes    Edwin Dada, MD [facility] 01/29/2021, 12:17 PM     Please page though [place] or Epic secure chat:  For [employer], Adult nurse

## 2021-01-29 NOTE — Care Management Important Message (Signed)
Important Message  Patient Details  Name: NORA ROOKE MRN: [id number] Date of Birth: Nov 12, 1925   Medicare Important Message Given:  N/A - LOS <3 / Initial given by admissions     Juliann Pulse A Freada [place] 01/29/2021, 7:44 AM

## 2021-01-29 NOTE — Plan of Care (Signed)
  Problem: Education: Goal: Knowledge of General Education information will improve Description: Including pain rating scale, medication(s)/side effects and non-pharmacologic comfort measures Outcome: Progressing   Problem: Clinical Measurements: Goal: Will remain free from infection Outcome: Progressing Goal: Respiratory complications will improve Outcome: Progressing   Problem: Activity: Goal: Risk for activity intolerance will decrease Outcome: Progressing   Problem: Safety: Goal: Ability to remain free from injury will improve Outcome: Progressing

## 2021-01-29 NOTE — TOC Initial Note (Signed)
Transition of Care [facility]) - Initial/Assessment Note    Patient Details  Name: Trevor Ferguson MRN: [id number] Date of Birth: 12/30/1925  [facility] CM/SW Contact:    Shelbie Hutching, RN Phone Number: 01/29/2021, 9:52 AM  Clinical Narrative:                 Patient admitted for GI Bleed.  RNCM received a call from patient's daughter, Juliann Pulse, this morning, she was returning call from [place] over the weekend.  Juliann Pulse reports that patient is from [place], his wife is in SNF [facility] at [facility] with long term care.  Patient walks with a Stewart, still drives, and is independent in ADL's.  PT is recommending home health at this time, daughter is in agreement with this and [facility] can provide PT and OT.  Patient has not been medically cleared for discharge yet, source of GI bleeding is still being investigated.   TOC will cont to follow.   Expected Discharge Plan: [place] Barriers to Discharge: Continued Medical Work up   Patient Goals and CMS Choice Patient states their goals for this hospitalization and ongoing recovery are:: to be able to go back to [place] at discharge CMS Medicare.gov Compare Post Acute Care list provided to:: Patient Choice offered to / list presented to : Patient  Expected Discharge Plan and Services Expected Discharge Plan: [place]   Discharge Planning Services: CM Consult Post Acute Care Choice: [place] arrangements for the past 2 months: [place]                 DME Arranged: N/A DME Agency: NA       HH Arranged: PT, OT [place] Agency: Other - See comment ([facility] and OT)        Prior Living Arrangements/Services Living arrangements for the past 2 months: [place] Lives with:: Self Patient language and need for interpreter reviewed:: Yes Do you feel safe going back to the place where you live?: Yes      Need  for Family Participation in Patient Care: Yes (Comment) (GI bleed) Care giver support system in place?: Yes (comment) (daughter) Current home services: DME (c[facility] and [facility]) Criminal Activity/Legal Involvement Pertinent to Current Situation/Hospitalization: No - Comment as needed  Activities of Daily Living Home Assistive Devices/Equipment: Environmental consultant (specify type) (4 wheel) ADL Screening (condition at time of admission) Patient's cognitive ability adequate to safely complete daily activities?: Yes Is the patient deaf or have difficulty hearing?: No Does the patient have difficulty seeing, even when wearing glasses/contacts?: No Does the patient have difficulty concentrating, remembering, or making decisions?: No Patient able to express need for assistance with ADLs?: Yes Does the patient have difficulty dressing or bathing?: No Independently performs ADLs?: Yes (appropriate for developmental age) Does the patient have difficulty walking or climbing stairs?: No Weakness of Legs: None Weakness of Arms/Hands: None  Permission Sought/Granted Permission sought to share information with : Case Manager, Customer service manager, Family Supports Permission granted to share information with : Yes, Verbal Permission Granted  Share Information with NAME: Juliann Pulse  Permission granted to share info w AGENCY: [facility] and exercise psychologist granted to share info w Relationship: daughter     Emotional Assessment       Orientation: : Oriented to Self, Oriented to Place, Oriented to  Time, Oriented to Situation Alcohol / Substance Use: Not Applicable Psych  Involvement: No (comment)  Admission diagnosis:  Rectal bleeding [K62.5] GI bleed [K92.2] Acute GI bleeding [K92.2] Patient Active Problem List   Diagnosis Date Noted   Polyp of sigmoid colon    Rectal bleeding    GI bleed 01/26/2021   Benign hypertensive kidney disease with chronic kidney disease 01/08/2021   Hemorrhoids    Rash     Depression    Weakness    Abdominal pain 03/06/2020   Acute kidney injury superimposed on CKD ([place]) 03/06/2020   Anemia 03/06/2020   Acute diverticulitis 03/01/2020   Pneumonia 07/17/2018   Leg swelling 12/18/2015   Chronic diastolic CHF (congestive heart failure) ([place]) 07/18/2015   Essential hypertension 07/18/2015   Prostate cancer ([place]) 06/02/2015   COPD (chronic obstructive pulmonary disease) ([place]) 03/28/2015   Sleep apnea 03/28/2015   SOB (shortness of breath) 03/28/2015   Personal history of transient ischemic attack (TIA), and cerebral infarction without residual deficits 09/21/2014   PCP:  Juluis Pitch, MD Pharmacy:   [facility] [place], [place] [facility] [place] [address] [place] [address] Phone: [phone] Fax: [phone]     Social Determinants of Health (SDOH) Interventions    Readmission Risk Interventions Readmission Risk Prevention Plan 01/29/2021 07/19/2018  Transportation Screening Complete Complete  PCP or Specialist Appt within 5-7 Days - Complete  PCP or Specialist Appt within 3-5 Days Complete -  Home Care Screening - Complete  Medication Review (RN CM) - Complete  HRI or Home Care Consult Complete -  Social Work Consult for [place] Planning/Counseling Complete -  Palliative Care Screening Not Applicable -  Medication Review (RN Care Manager) Complete -  Some recent data might be hidden

## 2021-01-29 NOTE — Progress Notes (Signed)
Trevor Darby, MD [address]  [place]  [name], [name] [address]  Main: [phone]  Fax: [phone] Pager: [phone]   Subjective: No acute events overnight.  Patient's hemoglobin is 8.3 today posttransfusion.  He did not have any episodes of melena.  He denies any complaints today.   Objective: Vital signs in last 24 hours: Vitals:   01/29/21 1045 01/29/21 1115 01/29/21 1319 01/29/21 1601  BP: (!) 143/49 (!) 136/57 (!) 126/52 (!) 129/47  Pulse: 61 66 78 60  Resp: 18 18  18   Temp: 97.8 F (36.6 C) 98.1 F (36.7 C) 97.8 F (36.6 C) 97.7 F (36.5 C)  TempSrc: Oral Oral Oral   SpO2: 98% 99% 99% 99%  Weight:      Height:       Weight change:   Intake/Output Summary (Last 24 hours) at 01/29/2021 1850 Last data filed at 01/29/2021 1700 Gross per 24 hour  Intake 2460 ml  Output --  Net 2460 ml     Exam: Heart:: Regular rate and rhythm, S1S2 present, or without murmur or extra heart sounds Lungs: clear to auscultation Abdomen: soft, nontender, normal bowel sounds   Lab Results: CBC Latest Ref Rng & Units 01/29/2021 01/29/2021 01/28/2021  WBC 4.0 - 10.5 K/uL - 10.3 -  Hemoglobin 13.0 - 17.0 g/dL 8.3(L) 6.8(L) 7.2(L)  Hematocrit 39.0 - 52.0 % 23.2(L) 19.6(L) 20.6(L)  Platelets 150 - 400 K/uL - 153 -   CMP Latest Ref Rng & Units 01/29/2021 01/27/2021 01/26/2021  Glucose 70 - 99 mg/dL 104(H) 122(H) 152(H)  BUN 8 - 23 mg/dL 40(H) 68(H) 60(H)  Creatinine 0.61 - 1.24 mg/dL 1.81(H) 1.86(H) 1.91(H)  Sodium 135 - 145 mmol/L 136 136 135  Potassium 3.5 - 5.1 mmol/L 3.9 4.3 4.2  Chloride 98 - 111 mmol/L 106 105 102  CO2 22 - 32 mmol/L 26 24 23   Calcium 8.9 - 10.3 mg/dL 8.3(L) 8.2(L) 8.6(L)  Total Protein 6.5 - 8.1 g/dL - - 6.1(L)  Total Bilirubin 0.3 - 1.2 mg/dL - - 0.7  Alkaline Phos 38 - 126 U/L - - 47  AST 15 - 41 U/L - - 18  ALT 0 - 44 U/L - - 15    Micro Results: Recent Results (from the past 240 hour(s))  Resp Panel by RT-PCR (Flu A&B, Covid)  Nasopharyngeal Swab     Status: None   Collection Time: 01/26/21  5:01 PM   Specimen: Nasopharyngeal Swab; Nasopharyngeal(NP) swabs in vial transport medium  Result Value Ref Range Status   SARS Coronavirus 2 by RT PCR NEGATIVE NEGATIVE Final    Comment: (NOTE) SARS-CoV-2 target nucleic acids are NOT DETECTED.  The SARS-CoV-2 RNA is generally detectable in upper respiratory specimens during the acute phase of infection. The lowest concentration of SARS-CoV-2 viral copies this assay can detect is 138 copies/mL. A negative result does not preclude SARS-Cov-2 infection and should not be used as the sole basis for treatment or other patient management decisions. A negative result may occur with  improper specimen collection/handling, submission of specimen other than nasopharyngeal swab, presence of viral mutation(s) within the areas targeted by this assay, and inadequate number of viral copies(<138 copies/mL). A negative result must be combined with clinical observations, patient history, and epidemiological information. The expected result is Negative.  Fact Sheet for Patients:  EntrepreneurPulse.com.au  Fact Sheet for Healthcare Providers:  IncredibleEmployment.be  This test is no t yet approved or cleared by the [place] and  has been authorized for detection and/or diagnosis of SARS-CoV-2 by FDA under an Emergency Use Authorization (EUA). This EUA will remain  in effect (meaning this test can be used) for the duration of the COVID-19 declaration under Section 564(b)(1) of the Act, 21 U.S.C.section 360bbb-3(b)(1), unless the authorization is terminated  or revoked sooner.       Influenza A by PCR NEGATIVE NEGATIVE Final   Influenza B by PCR NEGATIVE NEGATIVE Final    Comment: (NOTE) The Xpert Xpress SARS-CoV-2/FLU/RSV plus assay is intended as an aid in the diagnosis of influenza from Nasopharyngeal swab specimens and should not be  used as a sole basis for treatment. Nasal washings and aspirates are unacceptable for Xpert Xpress SARS-CoV-2/FLU/RSV testing.  Fact Sheet for Patients: EntrepreneurPulse.com.au  Fact Sheet for Healthcare Providers: IncredibleEmployment.be  This test is not yet approved or cleared by the [place] FDA and has been authorized for detection and/or diagnosis of SARS-CoV-2 by FDA under an Emergency Use Authorization (EUA). This EUA will remain in effect (meaning this test can be used) for the duration of the COVID-19 declaration under Section 564(b)(1) of the Act, 21 U.S.C. section 360bbb-3(b)(1), unless the authorization is terminated or revoked.  Performed at [facility], [address] [place], [place] [address]    Studies/Results: No results found. Medications: I have reviewed the patient's current medications. Prior to Admission:  Medications Prior to Admission  Medication Sig Dispense Refill Last Dose   albuterol (ACCUNEB) 0.63 MG/3ML nebulizer solution 3 (three) times daily as needed.   01/26/2021 at 0800   amLODipine (NORVASC) 5 MG tablet Take 5 mg by mouth daily.   01/26/2021 at 0800   Apoaequorin (PREVAGEN) 10 MG CAPS Take 10 mg by mouth daily.   01/26/2021 at 0800   aspirin EC 81 MG tablet Take 81 mg by mouth daily. Swallow whole.   01/26/2021 at 0800   azithromycin (ZITHROMAX) 250 MG tablet Take 250 mg by mouth 3 (three) times a week.   01/26/2021 at 0800   budesonide (PULMICORT) 0.5 MG/2ML nebulizer solution Inhale into the lungs.   01/26/2021 at 1200   Cholecalciferol (VITAMIN D-3) 25 MCG (1000 UT) CAPS Take 1,000 Units by mouth daily.    01/26/2021 at 0800   escitalopram (LEXAPRO) 10 MG tablet Take 10 mg by mouth daily.    01/26/2021 at 0800   Ferrous Sulfate (IRON PO) Take by mouth daily.   01/26/2021 at 0800   losartan-hydrochlorothiazide (HYZAAR) 100-25 MG tablet Take 0.5 tablets by mouth daily.   01/26/2021 at 0800    traZODone (DESYREL) 150 MG tablet Take 150 mg by mouth at bedtime.   01/25/2021 at 2000   acetaminophen (TYLENOL) 325 MG tablet Take 2 tablets (650 mg total) by mouth every 6 (six) hours as needed for mild pain (or Fever >/= 101).   prn at prn   famotidine (PEPCID) 20 MG tablet Take 20 mg by mouth 2 (two) times daily.   prn at prn   predniSONE (DELTASONE) 20 MG tablet Take 20 mg by mouth daily.      torsemide (DEMADEX) 10 MG tablet Take 10 mg by mouth daily as needed.   prn at prn   Scheduled:  acidophilus  2 capsule Oral TID   amLODipine  5 mg Oral Daily   budesonide  0.5 mg Nebulization Daily   cholecalciferol  1,000 Units Oral Daily   escitalopram  10 mg Oral Daily   hydrochlorothiazide  25 mg Oral Daily   polyethylene  glycol powder  0.5 Container Oral Once   traZODone  150 mg Oral QHS   Continuous:  methocarbamol (ROBAXIN) IV     TDD:[id number] **OR** acetaminophen, albuterol, ipratropium, methocarbamol (ROBAXIN) IV, ondansetron **OR** ondansetron (ZOFRAN) IV, oxyCODONE, phenol Anti-infectives (From admission, onward)    None      Scheduled Meds:  acidophilus  2 capsule Oral TID   amLODipine  5 mg Oral Daily   budesonide  0.5 mg Nebulization Daily   cholecalciferol  1,000 Units Oral Daily   escitalopram  10 mg Oral Daily   hydrochlorothiazide  25 mg Oral Daily   polyethylene glycol powder  0.5 Container Oral Once   traZODone  150 mg Oral QHS   Continuous Infusions:  methocarbamol (ROBAXIN) IV     PRN Meds:.acetaminophen **OR** acetaminophen, albuterol, ipratropium, methocarbamol (ROBAXIN) IV, ondansetron **OR** ondansetron (ZOFRAN) IV, oxyCODONE, phenol   Assessment: Active Problems:   COPD (chronic obstructive pulmonary disease) (HCC)   Chronic diastolic CHF (congestive heart failure) (HCC)   Essential hypertension   GI bleed   Rectal bleeding   Polyp of sigmoid colon  Patient presented with melena.  EGD was unremarkable.  Colonoscopy showed old blood in the  terminal ileum.  Therefore, he underwent video capsule study yesterday after the colonoscopy  VCE results Study is complete, capsule reached cecum Normal small bowel transit Old blood is seen in majority of the small bowel, localized areas of slow bleeding were identified in the proximal small bowel   Plan: Clear liquid diet today N.p.o. effective 5 AM for push enteroscopy tomorrow Recommend MiraLAX bowel prep  I have discussed alternative options, risks & benefits,  which include, but are not limited to, bleeding, infection, perforation,respiratory complication & drug reaction.  The patient agrees with this plan & written consent will be obtained.      LOS: 3 days   Emmylou Bieker 01/29/2021, 6:50 PM

## 2021-01-30 ENCOUNTER — Encounter
Admission: EM | Disposition: A | Discharge status: Home / Self Care | Payer: Self-pay | Source: Home / Self Care | Attending: Family Medicine

## 2021-01-30 ENCOUNTER — Inpatient Hospital Stay: Payer: Medicare PPO | Admitting: Anesthesiology

## 2021-01-30 ENCOUNTER — Encounter: Payer: Self-pay | Admitting: Gastroenterology

## 2021-01-30 DIAGNOSIS — R933 Abnormal findings on diagnostic imaging of other parts of digestive tract: Secondary | ICD-10-CM

## 2021-01-30 DIAGNOSIS — K283 Acute gastrojejunal ulcer without hemorrhage or perforation: Secondary | ICD-10-CM

## 2021-01-30 HISTORY — PX: ENTEROSCOPY: SHX5533

## 2021-01-30 LAB — CBC
HCT: 21.1 % — ABNORMAL LOW (ref 39.0–52.0)
Hemoglobin: 7.4 g/dL — ABNORMAL LOW (ref 13.0–17.0)
MCH: 32.3 pg (ref 26.0–34.0)
MCHC: 35.1 g/dL (ref 30.0–36.0)
MCV: 92.1 fL (ref 80.0–100.0)
Platelets: 149 10*3/uL — ABNORMAL LOW (ref 150–400)
RBC: 2.29 MIL/uL — ABNORMAL LOW (ref 4.22–5.81)
RDW: 14.8 % (ref 11.5–15.5)
WBC: 9.8 10*3/uL (ref 4.0–10.5)
nRBC: 0 % (ref 0.0–0.2)

## 2021-01-30 LAB — BASIC METABOLIC PANEL
Anion gap: 8 (ref 5–15)
BUN: 35 mg/dL — ABNORMAL HIGH (ref 8–23)
CO2: 26 mmol/L (ref 22–32)
Calcium: 8.2 mg/dL — ABNORMAL LOW (ref 8.9–10.3)
Chloride: 101 mmol/L (ref 98–111)
Creatinine, Ser: 1.83 mg/dL — ABNORMAL HIGH (ref 0.61–1.24)
GFR, Estimated: 34 mL/min — ABNORMAL LOW (ref >60)
Glucose, Bld: 128 mg/dL — ABNORMAL HIGH (ref 70–99)
Potassium: 3.7 mmol/L (ref 3.5–5.1)
Sodium: 135 mmol/L (ref 135–145)

## 2021-01-30 LAB — TYPE AND SCREEN
ABO/RH(D): O POS
Antibody Screen: NEGATIVE
Unit division: 0
Unit division: 0

## 2021-01-30 LAB — IRON AND TIBC
Iron: 33 ug/dL — ABNORMAL LOW (ref 45–182)
Saturation Ratios: 13 % — ABNORMAL LOW (ref 17.9–39.5)
TIBC: 246 ug/dL — ABNORMAL LOW (ref 250–450)
UIBC: 213 ug/dL

## 2021-01-30 LAB — BPAM RBC
Blood Product Expiration Date: 202207072359
Blood Product Expiration Date: 202207112359
ISSUE DATE / TIME: 202206111152
ISSUE DATE / TIME: 202206131049
Unit Type and Rh: 5100
Unit Type and Rh: 5100

## 2021-01-30 LAB — SURGICAL PATHOLOGY

## 2021-01-30 LAB — VITAMIN B12: Vitamin B-12: 784 pg/mL (ref 180–914)

## 2021-01-30 LAB — FOLATE: Folate: 34 ng/mL (ref >5.9)

## 2021-01-30 LAB — FERRITIN: Ferritin: 60 ng/mL (ref 24–336)

## 2021-01-30 SURGERY — ENTEROSCOPY
Anesthesia: General

## 2021-01-30 MED ORDER — SODIUM CHLORIDE 0.9 % IV SOLN: INTRAVENOUS | Status: DC

## 2021-01-30 MED ORDER — PROPOFOL 10 MG/ML IV BOLUS
INTRAVENOUS | Status: DC | PRN
  Administered 2021-01-30: 80 mg via INTRAVENOUS
  Administered 2021-01-30 (×2): 20 mg via INTRAVENOUS
  Administered 2021-01-30: 40 mg via INTRAVENOUS
  Administered 2021-01-30: 20 mg via INTRAVENOUS

## 2021-01-30 MED ORDER — FERROUS SULFATE 325 (65 FE) MG PO TABS
325.0000 mg | ORAL_TABLET | Freq: Every day | ORAL | Status: DC
  Administered 2021-02-02: 08:00:00 325 mg via ORAL
  Filled 2021-01-30: qty 1

## 2021-01-30 MED ORDER — SPOT INK MARKER SYRINGE KIT
PACK | SUBMUCOSAL | Status: DC | PRN
  Administered 2021-01-30: 1 mL via SUBMUCOSAL

## 2021-01-30 NOTE — Progress Notes (Signed)
[facility] PROGRESS NOTE    MARSHAUN LORTIE  KKX:[id number] DOB: 1925/12/04 DOA: 01/26/2021 PCP: Juluis Pitch, MD      Brief Narrative:  [name] is a 87 y.o. M with obesity, COPD, dCHF on home O2, prostate CA, OSA not on CPAP, hx stroke, hx diverticulitis, hx EPEC diarrhea, and HTN who presented with melena for 1 day.  Patient was in his usual state of health until day of admission when he had some fecal incontinence and black stool mixed with red blood, so he came to the ER.    In the ER, hemoglobin 9 g/dL he had another black stool.  Started on IV PPI, IV fluids and admitted to the hospital hemodynamically stable.  Denies NSAIDs.  Not on anticoagulation.           Assessment & Plan:  Acute GI bleed, small bowel source Initially thought to be upper, due to melanotic character.    EGD 6/11 however was unremarkable Then colonoscopy 6/12 showed blood throughout the colon and terminal ileum.   Capsule placed evening 6/12 showed localized areas of slow bleeding in proximal Sm bowel and old blood throughout the small bowel On 6/14, patient underwent push enteroscopy that showed only a small erosion in the jejunum, no obvious source of bleeding   Today, still with dark stools, probably clearing blood, no red blood in stool today. -Monitor stools -Trend Hgb in AM  -if clinical bleeding stops, patient may be discharged and follow up with GI as outpatient  -If further clinical bleeding, GI will proceed to tagged RBC scan      Acute blood loss anemia Baseline Hgb 12 g/dL.  Hgb 9 g/dL on admisison Trended down to 6.7 g/dL hospital day 2  Transfused 1 unit 6/12 Transfused 2nd unit 6/13   Hgb today, trending down ~7.4 g/dL - Trend Hgb - Without active cardiac symptoms, I will reserve transfusion for threshold 7 g/dL    Chronic diastolic CHF Hypertension Blood pressure normal, appears euvolemic, not on Lasix at home - Continue amlodipine,  HCTZ - Hold ARB - Hold aspirin   Chronic hypoxic respiratory failure Due to CHF and COPD, on 2L usually just at night  COPD Asymptomatic - As needed bronchodilators - Continue Pulmicort - Hold home azithromycin  Chronic kidney disease, stage IIIb Cr stable relative to baseline  Insomina, mood - Continue home trazodone, escitalopram               Disposition: Status is: Inpatient  Remains inpatient appropriate because: The patient has what appears to be ongoing bleeding, required 2 transfusions so far, not obviously stop bleeding so far, possibly ongoing diagnostic test      Dispo: The patient is from: Home              Anticipated d/c is to: Home with [facility]              Patient currently is not medically stable to d/c.   Difficult to place patient No   Patient mated with GI bleed.  Gastroenterology pursued an extensive work-up so far without obvious source, but he to this point still appears to have slow bleeding occurring.  If there are no signs of active bleeding tomorrow likely home with outpatient GI follow-up.  If further bleeding we will do RBC scan.    Level of care: Med-Surg       MDM: The below labs and imaging reports were reviewed and summarized above.  Medication  management as above.    DVT prophylaxis: SCDs Start: 01/26/21 1843  Code Status: DNR Family Communication: Daughter by phione    Consultants:  Gastroenterology  Procedures:  6/11 EGD: Normal esophagus, stomach without ulcers or gastritis or bleeding, normal duodenum 6/12 Colonoscopy: polyp, bood throughtout colon and terminal ielum 6/12 capsule endoscopy, slow bleed in proximal SB 6/14 push enteroscopy - jejurnal erosion without soufce of bleeding             Subjective: Still with dark stools.  No red blood in the stools.  No confusion, dizziness, syncope, chest pain.       Objective: Vitals:   01/30/21 0505 01/30/21 0806 01/30/21 1102 01/30/21 1221   BP: (!) 147/51 (!) 144/56 (!) 148/59 130/63  Pulse: 65 70 83   Resp: 18 18 (!) 21 (!) 22  Temp: 97.8 F (36.6 C) 97.8 F (36.6 C) (!) 97.4 F (36.3 C) (!) 97 F (36.1 C)  TempSrc: Oral Oral Temporal Temporal  SpO2: 96% 98% 100% 99%  Weight:      Height:        Intake/Output Summary (Last 24 hours) at 01/30/2021 1633 Last data filed at 01/30/2021 1218 Gross per 24 hour  Intake 1260 ml  Output 352 ml  Net 908 ml   Filed Weights   01/26/21 1409 01/26/21 2332 01/28/21 1249  Weight: 95.3 kg 93.9 kg 95.3 kg    Examination: General appearance: Elderly adult male, sleeping but easily arousable, no acute distress, interactive     HEENT: Nasal cannula in place, sclera anicteric, conjunctive are pink, lids and lashes normal.  No nasal deformity, discharge, or epistaxis moist, no oral lesions Skin: mild pallor Cardiac: Regular rhythm, soft systolic murmur, JVP not visible, no significant lower extremity edema Respiratory: Normal respiratory rate and rhythm, lungs clear without rales or wheezes Abdomen: Abdomen soft no tenderness palpation or guarding or distention MSK:  Neuro:    Psych: Sensorium intact and responds to questions appropriately, attention normal, affect pleasant, judgment and insight normal       Data Reviewed: I have personally reviewed following labs and imaging studies:  CBC: Recent Labs  Lab 01/26/21 2352 01/27/21 0410 01/27/21 1828 01/28/21 0520 01/28/21 1619 01/29/21 0529 01/29/21 1522 01/30/21 0610  WBC 15.3* 10.0  --  8.4  --  10.3  --  9.8  HGB 7.8* 6.7*   < > 7.4* 7.2* 6.8* 8.3* 7.4*  HCT 22.2* 19.2*   < > 21.1* 20.6* 19.6* 23.2* 21.1*  MCV 92.9 94.6  --  91.7  --  91.6  --  92.1  PLT 180 144*  --  141*  --  153  --  149*   < > = values in this interval not displayed.   Basic Metabolic Panel: Recent Labs  Lab 01/26/21 1411 01/27/21 0410 01/29/21 0529 01/30/21 0610  NA 135 136 136 135  K 4.2 4.3 3.9 3.7  CL 102 105 106 101  CO2 23 24  26 26   GLUCOSE 152* 122* 104* 128*  BUN 60* 68* 40* 35*  CREATININE 1.91* 1.86* 1.81* 1.83*  CALCIUM 8.6* 8.2* 8.3* 8.2*   GFR: Estimated Creatinine Clearance: 28.9 mL/min (A) (by C-G formula based on SCr of 1.83 mg/dL (H)). Liver Function Tests: Recent Labs  Lab 01/26/21 1411  AST 18  ALT 15  ALKPHOS 47  BILITOT 0.7  PROT 6.1*  ALBUMIN 3.7   No results for input(s): LIPASE, AMYLASE in the last 168 hours. No results for input(s):  AMMONIA in the last 168 hours. Coagulation Profile: No results for input(s): INR, PROTIME in the last 168 hours. Cardiac Enzymes: No results for input(s): CKTOTAL, CKMB, CKMBINDEX, TROPONINI in the last 168 hours. BNP (last 3 results) No results for input(s): PROBNP in the last 8760 hours. HbA1C: No results for input(s): HGBA1C in the last 72 hours. CBG: No results for input(s): GLUCAP in the last 168 hours. Lipid Profile: No results for input(s): CHOL, HDL, LDLCALC, TRIG, CHOLHDL, LDLDIRECT in the last 72 hours. Thyroid Function Tests: No results for input(s): TSH, T4TOTAL, FREET4, T3FREE, THYROIDAB in the last 72 hours. Anemia Panel: Recent Labs    01/30/21 0610 01/30/21 1317  VITAMINB12  --  784  FOLATE 34.0  --   FERRITIN 60  --   TIBC 246*  --   IRON 33*  --    Urine analysis:    Component Value Date/Time   COLORURINE YELLOW (A) 03/06/2020 2130   APPEARANCEUR CLEAR (A) 03/06/2020 2130   APPEARANCEUR Clear 03/19/2014 1049   LABSPEC 1.014 03/06/2020 2130   LABSPEC 1.016 03/19/2014 1049   PHURINE 5.0 03/06/2020 2130   GLUCOSEU NEGATIVE 03/06/2020 2130   GLUCOSEU Negative 03/19/2014 1049   HGBUR NEGATIVE 03/06/2020 2130   BILIRUBINUR NEGATIVE 03/06/2020 2130   BILIRUBINUR Negative 03/19/2014 [place] 03/06/2020 2130   PROTEINUR 30 (A) 03/06/2020 2130   NITRITE NEGATIVE 03/06/2020 2130   LEUKOCYTESUR TRACE (A) 03/06/2020 2130   LEUKOCYTESUR Negative 03/19/2014 1049   Sepsis  Labs: @LABRCNTIP (procalcitonin:4,lacticacidven:4)  ) Recent Results (from the past 240 hour(s))  Resp Panel by RT-PCR (Flu A&B, Covid) Nasopharyngeal Swab     Status: None   Collection Time: 01/26/21  5:01 PM   Specimen: Nasopharyngeal Swab; Nasopharyngeal(NP) swabs in vial transport medium  Result Value Ref Range Status   SARS Coronavirus 2 by RT PCR NEGATIVE NEGATIVE Final    Comment: (NOTE) SARS-CoV-2 target nucleic acids are NOT DETECTED.  The SARS-CoV-2 RNA is generally detectable in upper respiratory specimens during the acute phase of infection. The lowest concentration of SARS-CoV-2 viral copies this assay can detect is 138 copies/mL. A negative result does not preclude SARS-Cov-2 infection and should not be used as the sole basis for treatment or other patient management decisions. A negative result may occur with  improper specimen collection/handling, submission of specimen other than nasopharyngeal swab, presence of viral mutation(s) within the areas targeted by this assay, and inadequate number of viral copies(<138 copies/mL). A negative result must be combined with clinical observations, patient history, and epidemiological information. The expected result is Negative.  Fact Sheet for Patients:  EntrepreneurPulse.com.au  Fact Sheet for Healthcare Providers:  IncredibleEmployment.be  This test is no t yet approved or cleared by the [place] FDA and  has been authorized for detection and/or diagnosis of SARS-CoV-2 by FDA under an Emergency Use Authorization (EUA). This EUA will remain  in effect (meaning this test can be used) for the duration of the COVID-19 declaration under Section 564(b)(1) of the Act, 21 U.S.C.section 360bbb-3(b)(1), unless the authorization is terminated  or revoked sooner.       Influenza A by PCR NEGATIVE NEGATIVE Final   Influenza B by PCR NEGATIVE NEGATIVE Final    Comment: (NOTE) The Xpert  Xpress SARS-CoV-2/FLU/RSV plus assay is intended as an aid in the diagnosis of influenza from Nasopharyngeal swab specimens and should not be used as a sole basis for treatment. Nasal washings and aspirates are unacceptable for Xpert Xpress SARS-CoV-2/FLU/RSV testing.  Fact Sheet for Patients: EntrepreneurPulse.com.au  Fact Sheet for Healthcare Providers: IncredibleEmployment.be  This test is not yet approved or cleared by the [place] FDA and has been authorized for detection and/or diagnosis of SARS-CoV-2 by FDA under an Emergency Use Authorization (EUA). This EUA will remain in effect (meaning this test can be used) for the duration of the COVID-19 declaration under Section 564(b)(1) of the Act, 21 U.S.C. section 360bbb-3(b)(1), unless the authorization is terminated or revoked.  Performed at [facility], [address] [place], [place] [address]          Radiology Studies: No results found.      Scheduled Meds:  acidophilus  2 capsule Oral TID   amLODipine  5 mg Oral Daily   budesonide  0.5 mg Nebulization Daily   cholecalciferol  1,000 Units Oral Daily   escitalopram  10 mg Oral Daily   hydrochlorothiazide  25 mg Oral Daily   traZODone  150 mg Oral QHS   Continuous Infusions:  methocarbamol (ROBAXIN) IV       LOS: 4 days    Time spent: 25 minutes    Edwin Dada, MD [facility] 01/30/2021, 4:33 PM     Please page though Daniel or Epic secure chat:  For [employer], Adult nurse

## 2021-01-30 NOTE — Progress Notes (Signed)
PT Cancellation Note  Patient Details Name: Trevor Ferguson MRN: [id number] DOB: 06-05-1926   Cancelled Treatment:    Reason Eval/Treat Not Completed: Patient at procedure or test/unavailable.  PT charted reviewed and attempt to see pt.  Pt currently undergoing test/procedure according to nursing.  Will re-attempt to see at later date/time as medically appropriate.   Gwenlyn Saran, PT, DPT 01/30/21, 11:53 AM

## 2021-01-30 NOTE — Op Note (Signed)
[facility] Gastroenterology Patient Name: Trevor Ferguson Procedure Date: 01/30/2021 11:22 AM MRN: [id number] Account #: [id number] Date of Birth: 23-Jan-1926 Admit Type: Inpatient Age: 87 Room: [facility] ENDO ROOM 3 Gender: Male Note Status: Finalized Procedure:             Small bowel enteroscopy Indications:           Abnormal video capsule endoscopy, GI bleeding source                         not documented by previous EGD and colonoscopy,                         Obscure gastrointestinal bleeding, Gastrointestinal                         bleeding of unknown origin Providers:             Lin Landsman MD, MD Referring MD:          Lin Landsman MD, MD (Referring MD) Medicines:             General Anesthesia Complications:         No immediate complications. Estimated blood loss: None. Procedure:             Pre-Anesthesia Assessment:                        - Prior to the procedure, a History and Physical was                         performed, and patient medications and allergies were                         reviewed. The patient is competent. The risks and                         benefits of the procedure and the sedation options and                         risks were discussed with the patient. All questions                         were answered and informed consent was obtained.                         Patient identification and proposed procedure were                         verified by the physician, the nurse, the                         anesthesiologist, the anesthetist and the technician                         in the pre-procedure area in the procedure room in the                         endoscopy suite. Mental Status Examination: alert and  oriented. Airway Examination: normal oropharyngeal                         airway and neck mobility. Respiratory Examination:                         clear to auscultation. CV Examination:  normal.                         Prophylactic Antibiotics: The patient does not require                         prophylactic antibiotics. Prior Anticoagulants: The                         patient has taken no previous anticoagulant or                         antiplatelet agents. ASA Grade Assessment: III - A                         patient with severe systemic disease. After reviewing                         the risks and benefits, the patient was deemed in                         satisfactory condition to undergo the procedure. The                         anesthesia plan was to use general anesthesia.                         Immediately prior to administration of medications,                         the patient was re-assessed for adequacy to receive                         sedatives. The heart rate, respiratory rate, oxygen                         saturations, blood pressure, adequacy of pulmonary                         ventilation, and response to care were monitored                         throughout the procedure. The physical status of the                         patient was re-assessed after the procedure.                        After obtaining informed consent, the endoscope was                         passed under direct vision. Throughout the procedure,  the patient's blood pressure, pulse, and oxygen                         saturations were monitored continuously. The was                         introduced through the mouth and advanced to the                         proximal ileum. The small bowel enteroscopy was                         accomplished without difficulty. The patient tolerated                         the procedure well. Findings:      The esophagus was normal.      The stomach was normal.      A single localized erosion with no bleeding was found in the       mid-jejunum. Biopsies were taken with a cold forceps for histology. Area       of  extent reached was tattooed with an injection of Spot (carbon black),       likely distal jejunum or proximal ileum.      There was no evidence of significant pathology in the entire examined       duodenum.      A diverticulum was found in the proximal jejunum, in the mid-jejunum and       in the distal jejunum.      A diverticulum was found in the second portion of the duodenum. Impression:            - Normal esophagus.                        - Normal stomach.                        - Jejunal erosion without bleeding. Biopsied. Tattooed.                        - Normal examined duodenum.                        - Non-bleeding jejunal diverticulum.                        - Duodenal diverticulum. Recommendation:        - Return patient to hospital ward for ongoing care.                        - Resume regular diet today.                        - Return to GI office in 2 months. Procedure Code(s):     --- Professional ---                        [phone], Small intestinal endoscopy, enteroscopy beyond                         second portion of duodenum, including  ileum; with                         biopsy, single or multiple                        44799, Unlisted procedure, small intestine Diagnosis Code(s):     --- Professional ---                        K28.9, Gastrojejunal ulcer, unspecified as acute or                         chronic, without hemorrhage or perforation                        K92.2, Gastrointestinal hemorrhage, unspecified                        R93.3, Abnormal findings on diagnostic imaging of                         other parts of digestive tract CPT copyright 2019 American Medical Association. All rights reserved. The codes documented in this report are preliminary and upon coder review may  be revised to meet current compliance requirements. Dr. Ulyess Mort Lin Landsman MD, MD 01/30/2021 12:23:50 PM This report has been signed electronically. Number of  Addenda: 0 Note Initiated On: 01/30/2021 11:22 AM Estimated Blood Loss:  Estimated blood loss: none.      [facility]

## 2021-01-30 NOTE — Anesthesia Postprocedure Evaluation (Signed)
Anesthesia Post Note  Patient: Trevor Ferguson  Procedure(s) Performed: ENTEROSCOPY  Patient location during evaluation: Endoscopy Anesthesia Type: General Level of consciousness: awake and alert Pain management: pain level controlled Vital Signs Assessment: post-procedure vital signs reviewed and stable Respiratory status: spontaneous breathing, nonlabored ventilation, respiratory function stable and patient connected to nasal cannula oxygen Cardiovascular status: blood pressure returned to baseline and stable Postop Assessment: no apparent nausea or vomiting Anesthetic complications: no   No notable events documented.   Last Vitals:  Vitals:   01/30/21 1102 01/30/21 1221  BP: (!) 148/59 130/63  Pulse: 83   Resp: (!) 21 (!) 22  Temp: (!) 36.3 C (!) 36.1 C  SpO2: 100% 99%    Last Pain:  Vitals:   01/30/21 1231  TempSrc:   PainSc: Asleep                 Arita Miss

## 2021-01-30 NOTE — Transfer of Care (Signed)
Immediate Anesthesia Transfer of Care Note  Patient: Trevor Ferguson  Procedure(s) Performed: ENTEROSCOPY  Patient Location: PACU and Endoscopy Unit  Anesthesia Type:General  Level of Consciousness: drowsy and patient cooperative  Airway & Oxygen Therapy: Patient Spontanous Breathing and Patient connected to nasal cannula oxygen  Post-op Assessment: Report given to RN and Post -op Vital signs reviewed and stable  Post vital signs: Reviewed and stable  Last Vitals:  Vitals Value Taken Time  BP 130/63 01/30/21 1222  Temp 36.1 C 01/30/21 1221  Pulse 53 01/30/21 1224  Resp 22 01/30/21 1224  SpO2 98 % 01/30/21 1224  Vitals shown include unvalidated device data.  Last Pain:  Vitals:   01/30/21 1221  TempSrc: Temporal  PainSc: Asleep         Complications: No notable events documented.

## 2021-01-30 NOTE — Anesthesia Preprocedure Evaluation (Signed)
Anesthesia Evaluation  Patient identified by MRN, date of birth, ID band Patient awake    Reviewed: Allergy & Precautions, NPO status , Patient's Chart, lab work & pertinent test results  History of Anesthesia Complications Negative for: history of anesthetic complications  Airway Mallampati: III  TM Distance: >3 FB Neck ROM: Full    Dental  (+) Poor Dentition   Pulmonary sleep apnea , COPD,  COPD inhaler, Patient abstained from smoking.Not current smoker, former smoker,  Breathing feels at baseline today    + decreased breath sounds      Cardiovascular Exercise Tolerance: Good METShypertension, +CHF  (-) CAD and (-) Past MI (-) dysrhythmias  Rhythm:Regular Rate:Normal - Systolic murmurs 2021 TTE: 1. Left ventricular ejection fraction, by estimation, is 60 to 65%. The  left ventricle has normal function. The left ventricle has no regional  wall motion abnormalities. Left ventricular diastolic parameters were  normal.  2. Right ventricular systolic function is normal. The right ventricular  size is normal. There is severely elevated pulmonary artery systolic  pressure.  3. The mitral valve is normal in structure. Trivial mitral valve  regurgitation. No evidence of mitral stenosis.  4. The aortic valve is normal in structure. Aortic valve regurgitation is  not visualized. No aortic stenosis is present.  5. The inferior vena cava is normal in size with greater than 50%  respiratory variability, suggesting right atrial pressure of 3 mmHg.   Neuro/Psych PSYCHIATRIC DISORDERS Depression CVA, No Residual Symptoms    GI/Hepatic neg GERD  ,(+)     (-) substance abuse  ,   Endo/Other  neg diabetes  Renal/GU CRFRenal disease     Musculoskeletal   Abdominal   Peds  Hematology  (+) anemia ,   Anesthesia Other Findings Past Medical History: No date: COPD (chronic obstructive pulmonary disease) (HCC) No date:  Hypertension No date: Pneumonia     Comment:  2012,1985 hospitalized No date: Prostate cancer (HCC)     Comment:  with seed placement No date: Sleep apnea     Comment:  noncompliant, dx 1996 2012: Stroke (HCC)     Comment:  no residuals  Reproductive/Obstetrics                                                             Anesthesia Evaluation  Patient identified by MRN, date of birth, ID band Patient awake    Reviewed: Allergy & Precautions, NPO status , Patient's Chart, lab work & pertinent test results  History of Anesthesia Complications Negative for: history of anesthetic complications  Airway Mallampati: II  TM Distance: >3 FB Neck ROM: Full    Dental no notable dental hx. (+) Teeth Intact   Pulmonary neg pulmonary ROS, sleep apnea and Continuous Positive Airway Pressure Ventilation , pneumonia, resolved, COPD (Uses nebs daily),  COPD inhaler, former smoker,    Pulmonary exam normal breath sounds clear to auscultation       Cardiovascular Exercise Tolerance: Poor hypertension, Pt. on medications +CHF  negative cardio ROS Normal cardiovascular exam Rhythm:Regular Rate:Normal     Neuro/Psych PSYCHIATRIC DISORDERS Depression CVA negative neurological ROS  negative psych ROS   GI/Hepatic negative GI ROS, Neg liver ROS,   Endo/Other  negative endocrine ROS  Renal/GU Renal InsufficiencyRenal diseasenegative Renal ROS  negative genitourinary     Musculoskeletal negative musculoskeletal ROS (+)   Abdominal   Peds negative pediatric ROS (+)  Hematology negative hematology ROS (+) anemia ,   Anesthesia Other Findings   Reproductive/Obstetrics negative OB ROS                             Anesthesia Physical Anesthesia Plan  ASA: 3  Anesthesia Plan: MAC   Post-op Pain Management:    Induction: Intravenous  PONV Risk Score and Plan:   Airway Management Planned: Natural Airway and Nasal  Cannula  Additional Equipment:   Intra-op Plan:   Post-operative Plan:   Informed Consent: I have reviewed the patients History and Physical, chart, labs and discussed the procedure including the risks, benefits and alternatives for the proposed anesthesia with the patient or authorized representative who has indicated his/her understanding and acceptance.     Dental Advisory Given  Plan Discussed with: Anesthesiologist, CRNA and Surgeon  Anesthesia Plan Comments: (Patient consented for risks of anesthesia including but not limited to:  - adverse reactions to medications - damage to eyes, teeth, lips or other oral mucosa - nerve damage due to positioning  - sore throat or hoarseness - Damage to heart, brain, nerves, lungs, other parts of body or loss of life  Patient voiced understanding.)        Anesthesia Quick Evaluation  Anesthesia Physical  Anesthesia Plan  ASA: 3  Anesthesia Plan: General   Post-op Pain Management:    Induction: Intravenous  PONV Risk Score and Plan: 2 and Ondansetron, Propofol infusion and TIVA  Airway Management Planned: Nasal Cannula  Additional Equipment: None  Intra-op Plan:   Post-operative Plan:   Informed Consent: I have reviewed the patients History and Physical, chart, labs and discussed the procedure including the risks, benefits and alternatives for the proposed anesthesia with the patient or authorized representative who has indicated his/her understanding and acceptance.   Patient has DNR.  Discussed DNR with patient and Suspend DNR.   Dental advisory given  Plan Discussed with: CRNA and Surgeon  Anesthesia Plan Comments: (Discussed risks of anesthesia with patient, including possibility of difficulty with spontaneous ventilation under anesthesia necessitating airway intervention, PONV, and rare risks such as cardiac or respiratory or neurological events. Discussed DNR status; patient wishes to suspend it  perioperatively. Patient understands.)        Anesthesia Quick Evaluation

## 2021-01-31 ENCOUNTER — Inpatient Hospital Stay: Payer: Medicare PPO

## 2021-01-31 ENCOUNTER — Other Ambulatory Visit: Payer: Self-pay | Admitting: Radiology

## 2021-01-31 ENCOUNTER — Encounter: Payer: Self-pay | Admitting: Gastroenterology

## 2021-01-31 DIAGNOSIS — K922 Gastrointestinal hemorrhage, unspecified: Secondary | ICD-10-CM

## 2021-01-31 LAB — CBC
HCT: 19.2 % — ABNORMAL LOW (ref 39.0–52.0)
Hemoglobin: 6.7 g/dL — ABNORMAL LOW (ref 13.0–17.0)
MCH: 32.2 pg (ref 26.0–34.0)
MCHC: 34.9 g/dL (ref 30.0–36.0)
MCV: 92.3 fL (ref 80.0–100.0)
Platelets: 126 10*3/uL — ABNORMAL LOW (ref 150–400)
RBC: 2.08 MIL/uL — ABNORMAL LOW (ref 4.22–5.81)
RDW: 15 % (ref 11.5–15.5)
WBC: 12.5 10*3/uL — ABNORMAL HIGH (ref 4.0–10.5)
nRBC: 0 % (ref 0.0–0.2)

## 2021-01-31 LAB — HEMOGLOBIN AND HEMATOCRIT, BLOOD
HCT: 20.7 % — ABNORMAL LOW (ref 39.0–52.0)
Hemoglobin: 7.2 g/dL — ABNORMAL LOW (ref 13.0–17.0)

## 2021-01-31 LAB — SURGICAL PATHOLOGY

## 2021-01-31 MED ORDER — SODIUM PERTECHNETATE TC 99M INJECTION
10.0000 | Freq: Once | INTRAVENOUS | Status: AC | PRN
  Administered 2021-01-31: 13:00:00 10 via INTRAVENOUS

## 2021-01-31 MED ORDER — SODIUM CHLORIDE 0.9 % IV SOLN
300.0000 mg | Freq: Once | INTRAVENOUS | Status: AC
  Administered 2021-01-31: 18:00:00 300 mg via INTRAVENOUS
  Filled 2021-01-31: qty 15

## 2021-01-31 NOTE — Progress Notes (Signed)
[facility] PROGRESS NOTE    Trevor Ferguson  EHU:[id number] DOB: 30-Jan-1926 DOA: 01/26/2021 PCP: Juluis Pitch, MD      Brief Narrative:  Trevor Ferguson is a 87 y.o. M with obesity, COPD, dCHF on home O2, prostate CA, OSA not on CPAP, hx stroke, hx diverticulitis, hx EPEC diarrhea, and HTN who presented with melena for 1 day. S/p EGD and colonoscopy.  Plan for Meckel scan    Assessment & Plan:  Acute GI bleed, small bowel source  EGD 6/11 :  unremarkable colonoscopy 6/12: blood throughout the colon and terminal ileum.   Capsule placed evening 6/12 showed localized areas of slow bleeding in proximal Sm bowel and old blood throughout the small bowel On 6/14, patient underwent push enteroscopy that showed only a small erosion in the jejunum, no obvious source of bleeding -Hgb trending down to 6.7-- repeat h/h and plan for transfusion if <7 -plan for Meckel's scan today per GI  Acute blood loss anemia Baseline Hgb 12 g/dL. Transfused 1 unit 6/12 Transfused 2nd unit 6/13   Chronic diastolic CHF Hypertension Blood pressure normal, appears euvolemic, not on Lasix at home - Continue amlodipine, HCTZ - Hold ARB - Hold aspirin   Chronic hypoxic respiratory failure Due to CHF and COPD, on 2L usually just at night  COPD Asymptomatic - As needed bronchodilators - Continue Pulmicort - Hold home azithromycin  Chronic kidney disease, stage IIIb Cr stable relative to baseline  Insomina, mood - Continue home trazodone, escitalopram         Disposition: Status is: Inpatient  Remains inpatient appropriate because: The patient has what appears to be ongoing bleeding, required 2 transfusions so far, not obviously stop bleeding so far, possibly ongoing diagnostic test      Dispo: The patient is from: Home              Anticipated d/c is to: Home with [facility]              Patient currently is not medically stable to d/c.   Difficult to place patient  No    Level of care: Med-Surg     DVT prophylaxis: SCDs Start: 01/26/21 1843  Code Status: DNR     Consultants:  Gastroenterology  Procedures:  6/11 EGD: Normal esophagus, stomach without ulcers or gastritis or bleeding, normal duodenum 6/12 Colonoscopy: polyp, bood throughtout colon and terminal ielum 6/12 capsule endoscopy, slow bleed in proximal SB 6/14 push enteroscopy - jejurnal erosion without soufce of bleeding    Subjective: Patient states he did not see any blood in stools but did not look    Objective: Vitals:   01/30/21 2342 01/31/21 0344 01/31/21 0736 01/31/21 0752  BP: 139/62 (!) 128/49 (!) 141/60   Pulse: 84 83 84   Resp: 20 18 (!) 22   Temp: 97.9 F (36.6 C) 98 F (36.7 C) 98.4 F (36.9 C)   TempSrc: Oral Oral Oral   SpO2: 97% 98% 96% 95%  Weight:      Height:        Intake/Output Summary (Last 24 hours) at 01/31/2021 1035 Last data filed at 01/31/2021 1027 Gross per 24 hour  Intake 660 ml  Output 351 ml  Net 309 ml   Filed Weights   01/26/21 1409 01/26/21 2332 01/28/21 1249  Weight: 95.3 kg 93.9 kg 95.3 kg    Examination:  General: Appearance:    Obese male in no acute distress     Lungs:  respirations unlabored  Heart:    Normal heart rate. Normal rhythm. No murmurs, rubs, or gallops.    MS:   All extremities are intact.    Neurologic:   Awake, alert, oriented x 3. No apparent focal neurological           defect.           Data Reviewed: I have personally reviewed following labs and imaging studies:  CBC: Recent Labs  Lab 01/27/21 0410 01/27/21 1828 01/28/21 0520 01/28/21 1619 01/29/21 0529 01/29/21 1522 01/30/21 0610 01/31/21 0538 01/31/21 0952  WBC 10.0  --  8.4  --  10.3  --  9.8 12.5*  --   HGB 6.7*   < > 7.4*   < > 6.8* 8.3* 7.4* 6.7* 7.2*  HCT 19.2*   < > 21.1*   < > 19.6* 23.2* 21.1* 19.2* 20.7*  MCV 94.6  --  91.7  --  91.6  --  92.1 92.3  --   PLT 144*  --  141*  --  153  --  149* 126*  --    < >  = values in this interval not displayed.   Basic Metabolic Panel: Recent Labs  Lab 01/26/21 1411 01/27/21 0410 01/29/21 0529 01/30/21 0610  NA 135 136 136 135  K 4.2 4.3 3.9 3.7  CL 102 105 106 101  CO2 23 24 26 26   GLUCOSE 152* 122* 104* 128*  BUN 60* 68* 40* 35*  CREATININE 1.91* 1.86* 1.81* 1.83*  CALCIUM 8.6* 8.2* 8.3* 8.2*   GFR: Estimated Creatinine Clearance: 28.9 mL/min (A) (by C-G formula based on SCr of 1.83 mg/dL (H)). Liver Function Tests: Recent Labs  Lab 01/26/21 1411  AST 18  ALT 15  ALKPHOS 47  BILITOT 0.7  PROT 6.1*  ALBUMIN 3.7   No results for input(s): LIPASE, AMYLASE in the last 168 hours. No results for input(s): AMMONIA in the last 168 hours. Coagulation Profile: No results for input(s): INR, PROTIME in the last 168 hours. Cardiac Enzymes: No results for input(s): CKTOTAL, CKMB, CKMBINDEX, TROPONINI in the last 168 hours. BNP (last 3 results) No results for input(s): PROBNP in the last 8760 hours. HbA1C: No results for input(s): HGBA1C in the last 72 hours. CBG: No results for input(s): GLUCAP in the last 168 hours. Lipid Profile: No results for input(s): CHOL, HDL, LDLCALC, TRIG, CHOLHDL, LDLDIRECT in the last 72 hours. Thyroid Function Tests: No results for input(s): TSH, T4TOTAL, FREET4, T3FREE, THYROIDAB in the last 72 hours. Anemia Panel: Recent Labs    01/30/21 0610 01/30/21 1317  VITAMINB12  --  784  FOLATE 34.0  --   FERRITIN 60  --   TIBC 246*  --   IRON 33*  --    Urine analysis:    Component Value Date/Time   COLORURINE YELLOW (A) 03/06/2020 2130   APPEARANCEUR CLEAR (A) 03/06/2020 2130   APPEARANCEUR Clear 03/19/2014 1049   LABSPEC 1.014 03/06/2020 2130   LABSPEC 1.016 03/19/2014 1049   PHURINE 5.0 03/06/2020 2130   GLUCOSEU NEGATIVE 03/06/2020 2130   GLUCOSEU Negative 03/19/2014 1049   HGBUR NEGATIVE 03/06/2020 2130   BILIRUBINUR NEGATIVE 03/06/2020 2130   BILIRUBINUR Negative 03/19/2014 [place]  NEGATIVE 03/06/2020 2130   PROTEINUR 30 (A) 03/06/2020 2130   NITRITE NEGATIVE 03/06/2020 2130   LEUKOCYTESUR TRACE (A) 03/06/2020 2130   LEUKOCYTESUR Negative 03/19/2014 1049    ) Recent Results (from the past 240 hour(s))  Resp Panel by  RT-PCR (Flu A&B, Covid) Nasopharyngeal Swab     Status: None   Collection Time: 01/26/21  5:01 PM   Specimen: Nasopharyngeal Swab; Nasopharyngeal(NP) swabs in vial transport medium  Result Value Ref Range Status   SARS Coronavirus 2 by RT PCR NEGATIVE NEGATIVE Final    Comment: (NOTE) SARS-CoV-2 target nucleic acids are NOT DETECTED.  The SARS-CoV-2 RNA is generally detectable in upper respiratory specimens during the acute phase of infection. The lowest concentration of SARS-CoV-2 viral copies this assay can detect is 138 copies/mL. A negative result does not preclude SARS-Cov-2 infection and should not be used as the sole basis for treatment or other patient management decisions. A negative result may occur with  improper specimen collection/handling, submission of specimen other than nasopharyngeal swab, presence of viral mutation(s) within the areas targeted by this assay, and inadequate number of viral copies(<138 copies/mL). A negative result must be combined with clinical observations, patient history, and epidemiological information. The expected result is Negative.  Fact Sheet for Patients:  EntrepreneurPulse.com.au  Fact Sheet for Healthcare Providers:  IncredibleEmployment.be  This test is no t yet approved or cleared by the [place] FDA and  has been authorized for detection and/or diagnosis of SARS-CoV-2 by FDA under an Emergency Use Authorization (EUA). This EUA will remain  in effect (meaning this test can be used) for the duration of the COVID-19 declaration under Section 564(b)(1) of the Act, 21 U.S.C.section 360bbb-3(b)(1), unless the authorization is terminated  or revoked sooner.        Influenza A by PCR NEGATIVE NEGATIVE Final   Influenza B by PCR NEGATIVE NEGATIVE Final    Comment: (NOTE) The Xpert Xpress SARS-CoV-2/FLU/RSV plus assay is intended as an aid in the diagnosis of influenza from Nasopharyngeal swab specimens and should not be used as a sole basis for treatment. Nasal washings and aspirates are unacceptable for Xpert Xpress SARS-CoV-2/FLU/RSV testing.  Fact Sheet for Patients: EntrepreneurPulse.com.au  Fact Sheet for Healthcare Providers: IncredibleEmployment.be  This test is not yet approved or cleared by the [place] FDA and has been authorized for detection and/or diagnosis of SARS-CoV-2 by FDA under an Emergency Use Authorization (EUA). This EUA will remain in effect (meaning this test can be used) for the duration of the COVID-19 declaration under Section 564(b)(1) of the Act, 21 U.S.C. section 360bbb-3(b)(1), unless the authorization is terminated or revoked.  Performed at [facility], [address], [place], [place] [address]          Radiology Studies: No results found.      Scheduled Meds:  acidophilus  2 capsule Oral TID   amLODipine  5 mg Oral Daily   budesonide  0.5 mg Nebulization Daily   cholecalciferol  1,000 Units Oral Daily   escitalopram  10 mg Oral Daily   [START ON 02/02/2021] ferrous sulfate  325 mg Oral Q breakfast   hydrochlorothiazide  25 mg Oral Daily   traZODone  150 mg Oral QHS   Continuous Infusions:  methocarbamol (ROBAXIN) IV       LOS: 5 days    Time spent: 25 minutes    Geradine Girt, DO [facility] 01/31/2021, 10:35 AM     Please page though AMION or Epic secure chat:  For [employer], Adult nurse

## 2021-01-31 NOTE — Progress Notes (Signed)
Physical Therapy Treatment Patient Details Name: Trevor Ferguson MRN: [id number] DOB: 02/26/1926 Today's Date: 01/31/2021    History of Present Illness 87 y.o. M with obesity, COPD, dCHF on home O2, prostate CA, OSA not on CPAP, hx stroke, hx diverticulitis, hx EPEC diarrhea, and HTN who presented with melena.  Has had multiple dark/bloody stools since.    PT Comments    Pt received sitting in bedside recliner on 2.5LO2 with sats at 97%, HR 63bpm at rest. Completed seated strengthening exercises for B LE's with good teach back.  Pt is currently Mod I for bed mobility and transfers, Supervision for gait on level surfaces withouut assistive device.  Pt has Rollator at home for long distances.  Pt completed gait training on 2L O2 without rest break and sats declining to 92% with SOB/fatigue. Continue per POC with HHPT upon returning home.   Follow Up Recommendations  Home health PT     Equipment Recommendations  None recommended by PT    Recommendations for Other Services       Precautions / Restrictions Precautions Precautions: None Restrictions Weight Bearing Restrictions: No    Mobility  Bed Mobility Overal bed mobility: Modified Independent             General bed mobility comments: Pt easily gets to EOB, able to don socks while sitting    Transfers Overall transfer level: Modified independent Equipment used: Rolling Bertagnolli (2 wheeled)             General transfer comment: appropriate sequencing, UE use and confidence standing.  No safety concerns  Ambulation/Gait Ambulation/Gait assistance: Min guard Gait Distance (Feet): 160 Feet Assistive device: None Gait Pattern/deviations: Drifts right/left;Step-through pattern;Wide base of support     General Gait Details: Pt with one LOB event regained independently. Overall good tolerance for gait on @ L O2 without SOB, cues for slow pacing, O2 sats 92% upon completion   Stairs             Wheelchair  Mobility    Modified Rankin (Stroke Patients Only)       Balance                                            Cognition Arousal/Alertness: Awake/alert Behavior During Therapy: WFL for tasks assessed/performed Overall Cognitive Status: Within Functional Limits for tasks assessed                                 General Comments: Very pleasant without difficulty in following simple commands      Exercises General Exercises - Lower Extremity Ankle Circles/Pumps: Both;15 reps;Seated Long Arc Quad: Both;10 reps;Seated Hip ABduction/ADduction: Both;10 reps;Seated Hip Flexion/Marching: Both;10 reps;Seated    General Comments        Pertinent Vitals/Pain Pain Assessment: No/denies pain    Home Living                      Prior Function            PT Goals (current goals can now be found in the care plan section) Acute Rehab PT Goals Patient Stated Goal: get back to his normal activity tolerance Progress towards PT goals: Progressing toward goals    Frequency    Min 2X/week      PT  Plan Current plan remains appropriate    Co-evaluation              AM-PAC PT "6 Clicks" Mobility   Outcome Measure  Help needed turning from your back to your side while in a flat bed without using bedrails?: None Help needed moving from lying on your back to sitting on the side of a flat bed without using bedrails?: None Help needed moving to and from a bed to a chair (including a wheelchair)?: None Help needed standing up from a chair using your arms (e.g., wheelchair or bedside chair)?: None Help needed to walk in hospital room?: None Help needed climbing 3-5 steps with a railing? : None 6 Click Score: 24    End of Session Equipment Utilized During Treatment: Gait belt;Oxygen Activity Tolerance: Patient tolerated treatment well;Patient limited by fatigue Patient left: in chair;with call bell/phone within reach;with chair alarm  set   PT Visit Diagnosis: Muscle weakness (generalized) (M62.81);Difficulty in walking, not elsewhere classified (R26.2)     Time: 4859-2763 PT Time Calculation (min) (ACUTE ONLY): 30 min  Charges:  $Gait Training: 8-22 mins $Therapeutic Exercise: 8-22 mins                    Mikel Cella, PTA    Josie Dixon 01/31/2021, 12:52 PM

## 2021-01-31 NOTE — Progress Notes (Signed)
I have reviewed and concur with all of the documentation by the Student RN.  Sandria Senter, RN, [facility] Nursing Instructor

## 2021-02-01 LAB — CBC
HCT: 19.5 % — ABNORMAL LOW (ref 39.0–52.0)
Hemoglobin: 6.9 g/dL — ABNORMAL LOW (ref 13.0–17.0)
MCH: 32.4 pg (ref 26.0–34.0)
MCHC: 35.4 g/dL (ref 30.0–36.0)
MCV: 91.5 fL (ref 80.0–100.0)
Platelets: 132 10*3/uL — ABNORMAL LOW (ref 150–400)
RBC: 2.13 MIL/uL — ABNORMAL LOW (ref 4.22–5.81)
RDW: 15.3 % (ref 11.5–15.5)
WBC: 9.9 10*3/uL (ref 4.0–10.5)
nRBC: 0 % (ref 0.0–0.2)

## 2021-02-01 LAB — HEMOGLOBIN AND HEMATOCRIT, BLOOD
HCT: 24.9 % — ABNORMAL LOW (ref 39.0–52.0)
Hemoglobin: 9.3 g/dL — ABNORMAL LOW (ref 13.0–17.0)

## 2021-02-01 LAB — PREPARE RBC (CROSSMATCH)

## 2021-02-01 MED ORDER — IPRATROPIUM-ALBUTEROL 0.5-2.5 (3) MG/3ML IN SOLN
3.0000 mL | Freq: Two times a day (BID) | RESPIRATORY_TRACT | Status: DC
  Administered 2021-02-01 – 2021-02-02 (×2): 3 mL via RESPIRATORY_TRACT
  Filled 2021-02-01 (×2): qty 3

## 2021-02-01 MED ORDER — METHOCARBAMOL 500 MG PO TABS
500.0000 mg | ORAL_TABLET | Freq: Three times a day (TID) | ORAL | Status: DC | PRN
  Filled 2021-02-01: qty 1

## 2021-02-01 MED ORDER — RISAQUAD PO CAPS: 2.0000 | ORAL_CAPSULE | Freq: Three times a day (TID) | ORAL | 0 refills | Status: DC

## 2021-02-01 MED ORDER — TORSEMIDE 20 MG PO TABS
10.0000 mg | ORAL_TABLET | Freq: Once | ORAL | Status: AC
  Administered 2021-02-01: 10 mg via ORAL
  Filled 2021-02-01: qty 1

## 2021-02-01 MED ORDER — SODIUM CHLORIDE 0.9% IV SOLUTION: Freq: Once | INTRAVENOUS | Status: AC

## 2021-02-01 NOTE — Discharge Summary (Signed)
Physician Discharge Summary  Trevor Ferguson DGL:[id number] DOB: Aug 26, 1925 DOA: 01/26/2021  PCP: Juluis Pitch, MD  Admit date: 01/26/2021 Discharge date: 02/02/2021  Admitted From: home Discharge disposition: home   Recommendations for Outpatient Follow-Up:   Cbc 1 week Epo level pending- may need injections Home health   Discharge Diagnosis:   Active Problems:   COPD (chronic obstructive pulmonary disease) ([place])   Chronic diastolic CHF (congestive heart failure) ([place])   Essential hypertension   GI bleed   Rectal bleeding   Polyp of sigmoid colon    Discharge Condition: Improved.  Diet recommendation: Regular.  Wound care: None.  Code status: DNR   History of Present Illness:   Trevor Ferguson is a 87 y.o. male with history of diastolic CHF, COPD newly on home O2, CKD, diverticulitis, prior CVA, hypertension, who presents with black stools.   Patient reports that around 2 AM this morning he had a small amount of fecal incontinence and then went to the bathroom and had a bowel movement that was mostly black with a small amount of bright red blood as well.  He subsequently had 4-5 more similar bowel movements over the course of the night and decided to present to the emergency room for evaluation.  He denies using any NSAIDs, stating he only uses acetaminophen, though he does take a baby aspirin daily.  He does endorse having a daily cocktail hour where he has to double bourbon drink every day.   In the ED initial vital signs notable only for mild hypertension.  Lab work-up notable for hemoglobin of 9 decreased from 10.8 approximately 1 year ago as well as leukocytosis with WBC of 15.6, creatinine of 1.9 which is roughly his baseline and otherwise unremarkable CMP.  He was started on IV PPI drip, type and screen was obtained, and he was admitted for further management of suspected upper GI bleed.   Hospital Course by Problem:   Acute GI bleed, small bowel  source EGD 6/11 :  unremarkable colonoscopy 6/12: blood throughout the colon and terminal ileum.   Capsule placed evening 6/12 showed localized areas of slow bleeding in proximal Sm bowel and old blood throughout the small bowel On 6/14, patient underwent push enteroscopy that showed only a small erosion in the jejunum, no  -Meckel's scan: negative -no further bloody BMs -Hgb stable -epo pending   Acute blood loss anemia Baseline Hgb 12 g/dL. Transfused 1 unit 6/12 Transfused 2nd unit 6/13 3rd unit 5/18  Chronic diastolic CHF Hypertension -resume home meds   Chronic hypoxic respiratory failure Due to CHF and COPD, on 2L usually just at night  COPD Asymptomatic - As needed bronchodilators - Continue Pulmicort - Hold home azithromycin   Chronic kidney disease, stage IIIb Cr stable relative to baseline   Insomina, mood - Continue home trazodone, escitalopram    Medical Consultants:  GI    Discharge Exam:   Vitals:   02/02/21 0339 02/02/21 0734  BP: (!) 109/54 (!) 123/53  Pulse: 69 65  Resp: 18 16  Temp: 97.7 F (36.5 C) (!) 97.5 F (36.4 C)  SpO2: 93% 95%   Vitals:   02/01/21 1942 02/02/21 0051 02/02/21 0339 02/02/21 0734  BP: (!) 135/46 (!) 132/46 (!) 109/54 (!) 123/53  Pulse: 61 61 69 65  Resp: 16 20 18 16   Temp: 97.6 F (36.4 C) (!) 97.5 F (36.4 C) 97.7 F (36.5 C) (!) 97.5 F (36.4 C)  TempSrc: Oral  Oral  Oral  SpO2: 100% 97% 93% 95%  Weight:      Height:        General exam: Appears calm and comfortable.     The results of significant diagnostics from this hospitalization (including imaging, microbiology, ancillary and laboratory) are listed below for reference.     Procedures and Diagnostic Studies:   No results found.   Labs:   Basic Metabolic Panel: Recent Labs  Lab 01/26/21 1411 01/27/21 0410 01/29/21 0529 01/30/21 0610  NA 135 136 136 135  K 4.2 4.3 3.9 3.7  CL 102 105 106 101  CO2 23 24 26 26   GLUCOSE 152* 122*  104* 128*  BUN 60* 68* 40* 35*  CREATININE 1.91* 1.86* 1.81* 1.83*  CALCIUM 8.6* 8.2* 8.3* 8.2*   GFR Estimated Creatinine Clearance: 28.9 mL/min (A) (by C-G formula based on SCr of 1.83 mg/dL (H)). Liver Function Tests: Recent Labs  Lab 01/26/21 1411  AST 18  ALT 15  ALKPHOS 47  BILITOT 0.7  PROT 6.1*  ALBUMIN 3.7   No results for input(s): LIPASE, AMYLASE in the last 168 hours. No results for input(s): AMMONIA in the last 168 hours. Coagulation profile No results for input(s): INR, PROTIME in the last 168 hours.  CBC: Recent Labs  Lab 01/28/21 0520 01/28/21 1619 01/29/21 0529 01/29/21 1522 01/30/21 0610 01/31/21 0538 01/31/21 0952 02/01/21 0452 02/01/21 1645  WBC 8.4  --  10.3  --  9.8 12.5*  --  9.9  --   HGB 7.4*   < > 6.8*   < > 7.4* 6.7* 7.2* 6.9* 9.3*  HCT 21.1*   < > 19.6*   < > 21.1* 19.2* 20.7* 19.5* 24.9*  MCV 91.7  --  91.6  --  92.1 92.3  --  91.5  --   PLT 141*  --  153  --  149* 126*  --  132*  --    < > = values in this interval not displayed.   Cardiac Enzymes: No results for input(s): CKTOTAL, CKMB, CKMBINDEX, TROPONINI in the last 168 hours. BNP: Invalid input(s): POCBNP CBG: No results for input(s): GLUCAP in the last 168 hours. D-Dimer No results for input(s): DDIMER in the last 72 hours. Hgb A1c No results for input(s): HGBA1C in the last 72 hours. Lipid Profile No results for input(s): CHOL, HDL, LDLCALC, TRIG, CHOLHDL, LDLDIRECT in the last 72 hours. Thyroid function studies No results for input(s): TSH, T4TOTAL, T3FREE, THYROIDAB in the last 72 hours.  Invalid input(s): FREET3 Anemia work up Recent Labs    01/30/21 [place]   Microbiology Recent Results (from the past 240 hour(s))  Resp Panel by RT-PCR (Flu A&B, Covid) Nasopharyngeal Swab     Status: None   Collection Time: 01/26/21  5:01 PM   Specimen: Nasopharyngeal Swab; Nasopharyngeal(NP) swabs in vial transport medium  Result Value Ref Range Status   SARS  Coronavirus 2 by RT PCR NEGATIVE NEGATIVE Final    Comment: (NOTE) SARS-CoV-2 target nucleic acids are NOT DETECTED.  The SARS-CoV-2 RNA is generally detectable in upper respiratory specimens during the acute phase of infection. The lowest concentration of SARS-CoV-2 viral copies this assay can detect is 138 copies/mL. A negative result does not preclude SARS-Cov-2 infection and should not be used as the sole basis for treatment or other patient management decisions. A negative result may occur with  improper specimen collection/handling, submission of specimen other than nasopharyngeal swab, presence of viral mutation(s) within the areas  targeted by this assay, and inadequate number of viral copies(<138 copies/mL). A negative result must be combined with clinical observations, patient history, and epidemiological information. The expected result is Negative.  Fact Sheet for Patients:  EntrepreneurPulse.com.au  Fact Sheet for Healthcare Providers:  IncredibleEmployment.be  This test is no t yet approved or cleared by the [place] FDA and  has been authorized for detection and/or diagnosis of SARS-CoV-2 by FDA under an Emergency Use Authorization (EUA). This EUA will remain  in effect (meaning this test can be used) for the duration of the COVID-19 declaration under Section 564(b)(1) of the Act, 21 U.S.C.section 360bbb-3(b)(1), unless the authorization is terminated  or revoked sooner.       Influenza A by PCR NEGATIVE NEGATIVE Final   Influenza B by PCR NEGATIVE NEGATIVE Final    Comment: (NOTE) The Xpert Xpress SARS-CoV-2/FLU/RSV plus assay is intended as an aid in the diagnosis of influenza from Nasopharyngeal swab specimens and should not be used as a sole basis for treatment. Nasal washings and aspirates are unacceptable for Xpert Xpress SARS-CoV-2/FLU/RSV testing.  Fact Sheet for  Patients: EntrepreneurPulse.com.au  Fact Sheet for Healthcare Providers: IncredibleEmployment.be  This test is not yet approved or cleared by the [place] FDA and has been authorized for detection and/or diagnosis of SARS-CoV-2 by FDA under an Emergency Use Authorization (EUA). This EUA will remain in effect (meaning this test can be used) for the duration of the COVID-19 declaration under Section 564(b)(1) of the Act, 21 U.S.C. section 360bbb-3(b)(1), unless the authorization is terminated or revoked.  Performed at [facility], [address], [place], [place] [address]      Discharge Instructions:   Discharge Instructions     Diet general   Complete by: As directed    Increase activity slowly   Complete by: As directed       Allergies as of 02/02/2021       Reactions   Penicillins Hives, Rash, Other (See Comments)   Rash on hands and blisters on ankles when 87 years old Patient tolerates Zosyn   Bee Venom Palpitations   INSECT BITES/STINGS   Darvon [propoxyphene] Nausea Only, Rash        Medication List     STOP taking these medications    aspirin EC 81 MG tablet   predniSONE 20 MG tablet Commonly known as: DELTASONE       TAKE these medications    acetaminophen 325 MG tablet Commonly known as: TYLENOL Take 2 tablets (650 mg total) by mouth every 6 (six) hours as needed for mild pain (or Fever >/= 101).   acidophilus Caps capsule Take 2 capsules by mouth 3 (three) times daily.   albuterol 0.63 MG/3ML nebulizer solution Commonly known as: ACCUNEB 3 (three) times daily as needed.   amLODipine 5 MG tablet Commonly known as: NORVASC Take 5 mg by mouth daily.   azithromycin 250 MG tablet Commonly known as: ZITHROMAX Take 250 mg by mouth 3 (three) times a week.   budesonide 0.5 MG/2ML nebulizer solution Commonly known as: PULMICORT Inhale into the lungs.   escitalopram 10 MG  tablet Commonly known as: LEXAPRO Take 10 mg by mouth daily.   famotidine 20 MG tablet Commonly known as: PEPCID Take 20 mg by mouth 2 (two) times daily.   IRON PO Take by mouth daily.   losartan-hydrochlorothiazide 100-25 MG tablet Commonly known as: HYZAAR Take 0.5 tablets by mouth daily.   Prevagen 10 MG Caps Generic drug: Apoaequorin  Take 10 mg by mouth daily.   torsemide 10 MG tablet Commonly known as: DEMADEX Take 10 mg by mouth daily as needed.   traZODone 150 MG tablet Commonly known as: DESYREL Take 150 mg by mouth at bedtime.   Vitamin D-3 25 MCG (1000 UT) Caps Take 1,000 Units by mouth daily.        Follow-up Information     Juluis Pitch, MD Follow up in 1 week(s).   Specialty: Family Medicine Why: CBC Contact information: [address] [place] [phone]                  Time coordinating discharge: 35 min  Signed:  Geradine Girt DO  [facility] 02/02/2021, 8:33 AM

## 2021-02-01 NOTE — Progress Notes (Signed)
Trevor Darby, MD [address]  [place]  [place], [place] [address]  Main: [phone]  Fax: [phone] Pager: [phone]   Subjective: No acute events overnight, patient did not have any bowel movements today.  He received 1 unit of PRBCs today, his hemoglobin improved from 6.9 to 9.3.  He underwent Meckel scan yesterday which was negative   Objective: Vital signs in last 24 hours: Vitals:   02/01/21 1103 02/01/21 1129 02/01/21 1409 02/01/21 1527  BP: (!) 140/53 (!) 122/59 119/61 104/84  Pulse: 83 64 76 80  Resp: 20 18 18 18   Temp: 97.6 F (36.4 C) 97.7 F (36.5 C) 98 F (36.7 C) 97.8 F (36.6 C)  TempSrc: Oral Oral  Oral  SpO2: 100% 96% 98% 100%  Weight:      Height:       Weight change:   Intake/Output Summary (Last 24 hours) at 02/01/2021 1742 Last data filed at 02/01/2021 1422 Gross per 24 hour  Intake 840 ml  Output 1 ml  Net 839 ml     Exam: Heart:: Regular rate and rhythm, S1S2 present, or without murmur or extra heart sounds Lungs: normal and clear to auscultation Abdomen: soft, nontender, normal bowel sounds   Lab Results: CBC Latest Ref Rng & Units 02/01/2021 02/01/2021 01/31/2021  WBC 4.0 - 10.5 K/uL - 9.9 -  Hemoglobin 13.0 - 17.0 g/dL 9.3(L) 6.9(L) 7.2(L)  Hematocrit 39.0 - 52.0 % 24.9(L) 19.5(L) 20.7(L)  Platelets 150 - 400 K/uL - 132(L) -   CMP Latest Ref Rng & Units 01/30/2021 01/29/2021 01/27/2021  Glucose 70 - 99 mg/dL 128(H) 104(H) 122(H)  BUN 8 - 23 mg/dL 35(H) 40(H) 68(H)  Creatinine 0.61 - 1.24 mg/dL 1.83(H) 1.81(H) 1.86(H)  Sodium 135 - 145 mmol/L 135 136 136  Potassium 3.5 - 5.1 mmol/L 3.7 3.9 4.3  Chloride 98 - 111 mmol/L 101 106 105  CO2 22 - 32 mmol/L 26 26 24   Calcium 8.9 - 10.3 mg/dL 8.2(L) 8.3(L) 8.2(L)  Total Protein 6.5 - 8.1 g/dL - - -  Total Bilirubin 0.3 - 1.2 mg/dL - - -  Alkaline Phos 38 - 126 U/L - - -  AST 15 - 41 U/L - - -  ALT 0 - 44 U/L - - -    Micro Results: Recent Results (from the past 240  hour(s))  Resp Panel by RT-PCR (Flu A&B, Covid) Nasopharyngeal Swab     Status: None   Collection Time: 01/26/21  5:01 PM   Specimen: Nasopharyngeal Swab; Nasopharyngeal(NP) swabs in vial transport medium  Result Value Ref Range Status   SARS Coronavirus 2 by RT PCR NEGATIVE NEGATIVE Final    Comment: (NOTE) SARS-CoV-2 target nucleic acids are NOT DETECTED.  The SARS-CoV-2 RNA is generally detectable in upper respiratory specimens during the acute phase of infection. The lowest concentration of SARS-CoV-2 viral copies this assay can detect is 138 copies/mL. A negative result does not preclude SARS-Cov-2 infection and should not be used as the sole basis for treatment or other patient management decisions. A negative result may occur with  improper specimen collection/handling, submission of specimen other than nasopharyngeal swab, presence of viral mutation(s) within the areas targeted by this assay, and inadequate number of viral copies(<138 copies/mL). A negative result must be combined with clinical observations, patient history, and epidemiological information. The expected result is Negative.  Fact Sheet for Patients:  EntrepreneurPulse.com.au  Fact Sheet for Healthcare Providers:  IncredibleEmployment.be  This test is no t  yet approved or cleared by the [place] and  has been authorized for detection and/or diagnosis of SARS-CoV-2 by FDA under an Emergency Use Authorization (EUA). This EUA will remain  in effect (meaning this test can be used) for the duration of the COVID-19 declaration under Section 564(b)(1) of the Act, 21 U.S.C.section 360bbb-3(b)(1), unless the authorization is terminated  or revoked sooner.       Influenza A by PCR NEGATIVE NEGATIVE Final   Influenza B by PCR NEGATIVE NEGATIVE Final    Comment: (NOTE) The Xpert Xpress SARS-CoV-2/FLU/RSV plus assay is intended as an aid in the diagnosis of influenza from  Nasopharyngeal swab specimens and should not be used as a sole basis for treatment. Nasal washings and aspirates are unacceptable for Xpert Xpress SARS-CoV-2/FLU/RSV testing.  Fact Sheet for Patients: EntrepreneurPulse.com.au  Fact Sheet for Healthcare Providers: IncredibleEmployment.be  This test is not yet approved or cleared by the [place] FDA and has been authorized for detection and/or diagnosis of SARS-CoV-2 by FDA under an Emergency Use Authorization (EUA). This EUA will remain in effect (meaning this test can be used) for the duration of the COVID-19 declaration under Section 564(b)(1) of the Act, 21 U.S.C. section 360bbb-3(b)(1), unless the authorization is terminated or revoked.  Performed at [facility], [address] [place], [place] [address]    Studies/Results: NM Bowel Img Meckels  Result Date: 01/31/2021 CLINICAL DATA:  Melena, diarrhea question Meckel diverticulum EXAM: NUCLEAR MEDICINE BOWEL (MECKEL'S) SCAN TECHNIQUE: Sequential abdominal images were obtained following intravenous injection of radiopharmaceutical. RADIOPHARMACEUTICALS:  10.18 mCi Tc-75m pertechnetate IV COMPARISON:  CT abdomen and pelvis 03/10/2020 FINDINGS: Expected tracer uptake within the stomach. During the first 20 minutes of imaging, as tracer accumulates within the stomach, no abnormal localization of tracer is seen in the mid abdomen or extending into the RIGHT mid abdomen/pelvis to suggest ectopic gastric mucosa and Meckel's diverticulum. Later in the exam, faint than progressive tracer is seen within small bowel loops in the LEFT mid abdomen, likely representing excretion of gastric tracer into small bowel. IMPRESSION: No ectopic localization of tracer is seen within the abdomen at the time of gastric mucosa visualization to suggest ectopic gastric mucosa such as from a Meckel's diverticulum. The prior CT exam demonstrates extensive small  bowel diverticulosis and mid small bowel diverticulitis. Electronically Signed   By: Lavonia Dana M.D.   On: 01/31/2021 16:00   Medications: I have reviewed the patient's current medications. Prior to Admission:  Medications Prior to Admission  Medication Sig Dispense Refill Last Dose   albuterol (ACCUNEB) 0.63 MG/3ML nebulizer solution 3 (three) times daily as needed.   01/26/2021 at 0800   amLODipine (NORVASC) 5 MG tablet Take 5 mg by mouth daily.   01/26/2021 at 0800   Apoaequorin (PREVAGEN) 10 MG CAPS Take 10 mg by mouth daily.   01/26/2021 at 0800   aspirin EC 81 MG tablet Take 81 mg by mouth daily. Swallow whole.   01/26/2021 at 0800   azithromycin (ZITHROMAX) 250 MG tablet Take 250 mg by mouth 3 (three) times a week.   01/26/2021 at 0800   budesonide (PULMICORT) 0.5 MG/2ML nebulizer solution Inhale into the lungs.   01/26/2021 at 1200   Cholecalciferol (VITAMIN D-3) 25 MCG (1000 UT) CAPS Take 1,000 Units by mouth daily.    01/26/2021 at 0800   escitalopram (LEXAPRO) 10 MG tablet Take 10 mg by mouth daily.    01/26/2021 at 0800   Ferrous Sulfate (IRON PO) Take  by mouth daily.   01/26/2021 at 0800   losartan-hydrochlorothiazide (HYZAAR) 100-25 MG tablet Take 0.5 tablets by mouth daily.   01/26/2021 at 0800   traZODone (DESYREL) 150 MG tablet Take 150 mg by mouth at bedtime.   01/25/2021 at 2000   acetaminophen (TYLENOL) 325 MG tablet Take 2 tablets (650 mg total) by mouth every 6 (six) hours as needed for mild pain (or Fever >/= 101).   prn at prn   famotidine (PEPCID) 20 MG tablet Take 20 mg by mouth 2 (two) times daily.   prn at prn   predniSONE (DELTASONE) 20 MG tablet Take 20 mg by mouth daily.      torsemide (DEMADEX) 10 MG tablet Take 10 mg by mouth daily as needed.   prn at prn   Scheduled:  acidophilus  2 capsule Oral TID   amLODipine  5 mg Oral Daily   budesonide  0.5 mg Nebulization Daily   cholecalciferol  1,000 Units Oral Daily   escitalopram  10 mg Oral Daily   [START ON 02/02/2021]  ferrous sulfate  325 mg Oral Q breakfast   hydrochlorothiazide  25 mg Oral Daily   ipratropium-albuterol  3 mL Nebulization BID   traZODone  150 mg Oral QHS   Continuous: EXH:[id number] **OR** acetaminophen, albuterol, ipratropium, methocarbamol, ondansetron **OR** ondansetron (ZOFRAN) IV, oxyCODONE, phenol Anti-infectives (From admission, onward)    None      Scheduled Meds:  acidophilus  2 capsule Oral TID   amLODipine  5 mg Oral Daily   budesonide  0.5 mg Nebulization Daily   cholecalciferol  1,000 Units Oral Daily   escitalopram  10 mg Oral Daily   [START ON 02/02/2021] ferrous sulfate  325 mg Oral Q breakfast   hydrochlorothiazide  25 mg Oral Daily   ipratropium-albuterol  3 mL Nebulization BID   traZODone  150 mg Oral QHS   Continuous Infusions: PRN Meds:.acetaminophen **OR** acetaminophen, albuterol, ipratropium, methocarbamol, ondansetron **OR** ondansetron (ZOFRAN) IV, oxyCODONE, phenol   Assessment: Active Problems:   COPD (chronic obstructive pulmonary disease) (HCC)   Chronic diastolic CHF (congestive heart failure) (HCC)   Essential hypertension   GI bleed   Rectal bleeding   Polyp of sigmoid colon   Plan: Melena: Patient underwent upper endoscopy on 6/11 which was unremarkable, followed by colonoscopy on 6/12 which revealed black stool in the terminal ileum.  Subsequently, underwent video capsule endoscopy on 6/12 which revealed diffuse small bowel diverticulosis and localized areas of oozing in the proximal small bowel.  Subsequently, patient underwent push enteroscopy on 6/14 which revealed a diminutive clean-based erosion in the proximal small bowel.  Less likely the source of anemia.  Patient also has history of CKD which can contribute to anemia of chronic kidney disease.  Patient responded appropriately to blood transfusion today with no signs of active GI bleed.  Recommend to check erythropoietin levels.  He does have iron deficiency, recommend iron  infusion as outpatient.  Patient can be discharged home today and close follow-up with GI as outpatient in 1 to 2 months   LOS: 6 days   Jazaria Jarecki 02/01/2021, 5:42 PM

## 2021-02-01 NOTE — Progress Notes (Signed)
Despite plans for discharge after blood transfusion no family available to take patient home post transfusion.  Patient can discharge in the AM.

## 2021-02-01 NOTE — Progress Notes (Signed)
LM for daughter JV

## 2021-02-02 DIAGNOSIS — K922 Gastrointestinal hemorrhage, unspecified: Secondary | ICD-10-CM

## 2021-02-02 LAB — BPAM RBC
Blood Product Expiration Date: 202207112359
ISSUE DATE / TIME: 202206161107
Unit Type and Rh: 5100

## 2021-02-02 LAB — TYPE AND SCREEN
ABO/RH(D): O POS
Antibody Screen: NEGATIVE
Unit division: 0

## 2021-02-02 LAB — ERYTHROPOIETIN: Erythropoietin: 54.9 m[IU]/mL — ABNORMAL HIGH (ref 2.6–18.5)

## 2021-02-02 NOTE — TOC Transition Note (Signed)
Transition of Care [facility]) - CM/SW Discharge Note   Patient Details  Name: Trevor Ferguson MRN: [id number] Date of Birth: [birth year]  [facility] CM/SW Contact:  Shelbie Hutching, RN Phone Number: 02/02/2021, 9:24 AM   Clinical Narrative:    Patient medically cleared for discharge back to [facility] facility.  Patient's daughter will be picking him up at 1100.  Patient will be able to receive PT at [facility].  Orders faxed to Therapy department at [facility] [phone].     Final next level of care: OP Rehab Barriers to Discharge: Continued Medical Work up   Patient Goals and CMS Choice Patient states their goals for this hospitalization and ongoing recovery are:: to be able to go back to [place] at discharge CMS Medicare.gov Compare Post Acute Care list provided to:: Patient Choice offered to / list presented to : Patient  Discharge Placement                       Discharge Plan and Services   Discharge Planning Services: CM Consult Post Acute Care Choice: Home Health          DME Arranged: N/A DME Agency: NA       HH Arranged: PT [place] Agency: Other - See comment [facility]) Date HH Agency Contacted: 02/02/21 Time [place]: [phone] Representative spoke with at [place]: [place] Determinants of Health ([place]) Interventions     Readmission Risk Interventions Readmission Risk Prevention Plan 01/29/2021 07/19/2018  Transportation Screening Complete Complete  PCP or Specialist Appt within 5-7 Days - Complete  PCP or Specialist Appt within 3-5 Days Complete -  Home Care Screening - Complete  Medication Review (RN CM) - Complete  HRI or Home Care Consult Complete -  Social Work Consult for [place] Planning/Counseling Complete -  Palliative Care Screening Not Applicable -  Medication Review Press photographer) Complete -  Some recent data might be hidden

## 2021-02-02 NOTE — Care Management Important Message (Signed)
Important Message  Patient Details  Name: Trevor Ferguson MRN: [id number] Date of Birth: 04/15/1926   Medicare Important Message Given:  Yes     Juliann Pulse A Gurveer Colucci 02/02/2021, 10:16 AM

## 2021-02-02 NOTE — Progress Notes (Signed)
Went through discharge instruction with patient, verbalize understanding, pt left POV with his daughter

## 2021-02-08 ENCOUNTER — Other Ambulatory Visit: Payer: Self-pay

## 2021-02-08 ENCOUNTER — Ambulatory Visit: Payer: Medicare PPO | Admitting: Nurse Practitioner

## 2021-02-08 ENCOUNTER — Encounter: Payer: Self-pay | Admitting: Nurse Practitioner

## 2021-02-08 VITALS — BP 118/70 | HR 70 | Temp 98.5°F | Ht 68.0 in | Wt 209.5 lb

## 2021-02-08 DIAGNOSIS — W5501XA Bitten by cat, initial encounter: Secondary | ICD-10-CM

## 2021-02-08 DIAGNOSIS — L03113 Cellulitis of right upper limb: Secondary | ICD-10-CM | POA: Diagnosis not present

## 2021-02-08 MED ORDER — METRONIDAZOLE 500 MG PO TABS: 500.0000 mg | ORAL_TABLET | Freq: Three times a day (TID) | ORAL | 0 refills | Status: AC

## 2021-02-08 MED ORDER — DOXYCYCLINE HYCLATE 100 MG PO TABS: 100.0000 mg | ORAL_TABLET | Freq: Two times a day (BID) | ORAL | 0 refills | Status: AC

## 2021-02-08 NOTE — Patient Instructions (Addendum)
To start florastor twice daily for 2 weeks  To take antibiotic with food- take entire course of antibiotics If area getting worse while on antibiotics go to the hospital  Take all antibiotics.   Make sure you Tetanus vaccine is up to date- within the last 5 years.   HOLD Azithromycin - PCP will direct you on when to restart.    Cat-Scratch Disease, Adult Cat-scratch disease is a bacterial infection that is caused by a bite or scratch from an infected cat. It is sometimes called cat-scratch fever. The infection can cause a red bump, swollen glands, and flu-like symptoms. The infection does not spread (is not contagious) between humans. In most cases, the infection is mild and it clears up without treatment. However, a more severe infection may develop in people who have other illnesses or problems that weaken the body's disease-fighting system (immune system). What are the causes? This condition is caused by bacteria called Bartonella henselae. These bacteria may be present in the mouth or on the claws of cats or kittens, especially those that are younger than 94 year old. Cats do not look oract sick when they have this infection. The bacteria can spread to humans through: A bite or scratch. Having contact with an infected cat and then touching your eyes or mouth. What increases the risk? You are more likely to develop this condition if: You own or interact with a cat. You have a weakened immune system. Your immune system may be weakened if: You are pregnant. You have certain conditions like cancer, HIV, or AIDS. You received a donated organ (transplant). What are the signs or symptoms? The first symptoms may be: A bite or scratch that does not heal. A red bump near the wound. The bump may be red, warm, and tender to the touch. Other symptoms may take a few weeks to develop. They may include: Swollen, tender glands (lymph nodes) in the neck or under the arms. Fever. Rash. Joint  pain. Headache. Low energy. Poor appetite. How is this diagnosed? This condition is diagnosed based on your symptoms and your history of a scratch or bite from a cat. Your health care provider will examine your skin and look for swollen lymph nodes. You may have tests, such as: A culture test. This involves testing a sample of fluid or pus from your wound. Blood tests. A lymph node biopsy. This involves removing and testing a tissue sample from a swollen lymph node. How is this treated? Cat-scratch disease usually goes away without treatment in 2-4 months. Your health care provider may recommend home care, such as using a warm cloth (compress) and over-the-counter pain medicine. You may be prescribed antibiotic medicine if: Your immune system is weakened. Your symptoms cause discomfort. If you have a painful, swollen lymph node, it may need to be drained with aneedle. (This is rare.) Follow these instructions at home: Rest and return to your normal activities as told by your health care provider. Take over-the-counter and prescription medicines only as told by your health care provider. If you were prescribed an antibiotic medicine, take it as told by your health care provider. Do not stop taking the antibiotic even if you start to feel better. Apply warm compresses to the area as told your health care provider. Check your wound or swollen gland areas every day for signs of infection, such as: Redness, swelling, or pain. Fluid or blood. Warmth. Pus or a bad smell. Keep all follow-up visits as told by your health  care provider. This is important. How is this prevented? Avoid being scratched or bitten while playing with cats. To do this, avoid playing roughly or taking food away from a cat. Wash your hands well after you have contact with a cat. If you get scratched or bitten, wash the injured area as soon as possible with warm water and soap. Do not let a cat lick your skin if you have  any cuts, sores, or scratches. Do not pick up or play with stray cats. If you have an indoor cat, do not let your cat outside. Having contact with stray cats may make your cat more likely to have contact with the bacteria. Contact a health care provider if: You have redness, swelling, or pain around your wound. You have fluid, blood, or pus coming from your wound. Your wound is warm to the touch. There is pus or a bad smell coming from your wound. You have a fever. Your symptoms get worse or do not get better at home. Get help right away if: You have more swelling of your lymph nodes in your neck or under your arms. You develop pain in your abdomen. You develop a skin rash. You feel dizzy or you faint. You develop inflammation of your eye or vision problems. You have pain in your bones. You develop a stiff neck. Summary Cat-scratch disease, sometimes called cat-scratch fever, is a bacterial infection that is caused by a bite or scratch from an infected cat. The infection can cause a red bump, swollen glands, and flu-like symptoms. Bacteria that cause this condition may be present in the mouth or on the claws of cats or kittens, especially those that are younger than 85 year old. If you were prescribed an antibiotic medicine, take it as told by your health care provider. Do not stop taking the antibiotic even if you start to feel better. This information is not intended to replace advice given to you by your health care provider. Make sure you discuss any questions you have with your healthcare provider. Document Revised: 04/07/2020 Document Reviewed: 04/07/2020 Elsevier Patient Education  [place].

## 2021-02-08 NOTE — Progress Notes (Signed)
Careteam: Patient Care Team: Trevor Pitch, MD as PCP - General (Family Medicine) Trevor Situ Kathlene November, MD as Consulting Physician (Cardiology)  Advanced Directive information    Allergies  Allergen Reactions   Penicillins Hives, Rash and Other (See Comments)    Rash on hands and blisters on ankles when 87 years old Patient tolerates Zosyn   Bee Venom Palpitations    INSECT BITES/STINGS   Darvon [Propoxyphene] Nausea Only and Rash    Chief Complaint  Patient presents with   Acute Visit    Patient was bitten by cat a couple of days ago. Patient has swelling in right hand. Last 2 fingers on right hand are sore and red.     HPI: Patient is a 87 y.o. male seen in [facility] at the [facility] to evaluate cat bite.  Reports his cat bit him a few days ago. Now hand in red, swollen and painful.  He was recently hospitalized for GI bleed and discharged last week.  He has been holding prednisone and baby ASA.  Has low energy but this has been baseline.  No fever or chills. Does not feel "bad"   Review of Systems:  Review of Systems  Constitutional:  Negative for chills and fever.  Skin:        Pain and swelling with redness to right hand after cat bite.   Past Medical History:  Diagnosis Date   COPD (chronic obstructive pulmonary disease) ([facility])    Hypertension    Pneumonia    2012,1985 hospitalized   Prostate cancer [facility])    with seed placement   Sleep apnea    noncompliant, dx 1996   Stroke ([facility]) 2012   no residuals   Past Surgical History:  Procedure Laterality Date   adenoid     APPENDECTOMY     COLONOSCOPY WITH PROPOFOL N/A 01/28/2021   Procedure: COLONOSCOPY WITH PROPOFOL;  Surgeon: Lucilla Lame, MD;  Location: [facility] ENDOSCOPY;  Service: Endoscopy;  Laterality: N/A;  GI bleed   ENTEROSCOPY N/A 01/30/2021   Procedure: ENTEROSCOPY;  Surgeon: Lin Landsman, MD;  Location: [facility];  Service: Gastroenterology;  Laterality: N/A;    ESOPHAGOGASTRODUODENOSCOPY (EGD) WITH PROPOFOL N/A 01/27/2021   Procedure: ESOPHAGOGASTRODUODENOSCOPY (EGD) WITH PROPOFOL;  Surgeon: Lucilla Lame, MD;  [facility];  Service: Endoscopy;  Laterality: N/A;   GIVENS CAPSULE STUDY N/A 01/28/2021   Procedure: GIVENS CAPSULE STUDY;  Surgeon: Lin Landsman, MD;  Location: [facility];  Service: Gastroenterology;  Laterality: N/A;   HEMORRHOID SURGERY     spine cyst     TONSILLECTOMY     Social History:   reports that he quit smoking about 35 years ago. His smoking use included cigarettes. He has never used smokeless tobacco. He reports current alcohol use of about 14.0 standard drinks of alcohol per week. He reports that he does not use drugs.  Family History  Problem Relation Age of Onset   Congestive Heart Failure Mother    COPD Sister    Asthma Sister    Tuberculosis Maternal Grandmother    Stroke Maternal Grandfather     Medications: Patient's Medications  New Prescriptions   No medications on file  Previous Medications   ACETAMINOPHEN (TYLENOL) 325 MG TABLET    Take 2 tablets (650 mg total) by mouth every 6 (six) hours as needed for mild pain (or Fever >/= 101).   ACIDOPHILUS (RISAQUAD) CAPS CAPSULE    Take 2 capsules by mouth 3 (three) times daily.  ALBUTEROL (ACCUNEB) 0.63 MG/3ML NEBULIZER SOLUTION    3 (three) times daily as needed.   AMLODIPINE (NORVASC) 5 MG TABLET    Take 5 mg by mouth daily.   APOAEQUORIN (PREVAGEN) 10 MG CAPS    Take 10 mg by mouth daily.   AZITHROMYCIN (ZITHROMAX) 250 MG TABLET    Take 250 mg by mouth 3 (three) times a week.   BUDESONIDE (PULMICORT) 0.5 MG/2ML NEBULIZER SOLUTION    Inhale into the lungs.   CHOLECALCIFEROL (VITAMIN D-3) 25 MCG (1000 UT) CAPS    Take 1,000 Units by mouth daily.    ESCITALOPRAM (LEXAPRO) 10 MG TABLET    Take 10 mg by mouth daily.    FAMOTIDINE (PEPCID) 20 MG TABLET    Take 20 mg by mouth 2 (two) times daily.   FERROUS SULFATE (IRON PO)    Take by mouth  daily.   LOSARTAN-HYDROCHLOROTHIAZIDE (HYZAAR) 100-25 MG TABLET    Take 0.5 tablets by mouth daily.   TORSEMIDE (DEMADEX) 10 MG TABLET    Take 10 mg by mouth daily as needed.   TRAZODONE (DESYREL) 150 MG TABLET    Take 150 mg by mouth at bedtime.  Modified Medications   No medications on file  Discontinued Medications   No medications on file    Physical Exam:  Vitals:   02/08/21 1034  BP: 118/70  Pulse: 70  Temp: 98.5 F (36.9 C)  TempSrc: Oral  SpO2: 91%  Weight: 209 lb 8 oz (95 kg)  Height: 5\' 8"  (1.727 m)   Body mass index is 31.85 kg/m. Wt Readings from Last 3 Encounters:  02/08/21 209 lb 8 oz (95 kg)  01/28/21 210 lb (95.3 kg)  10/23/20 204 lb 6 oz (92.7 kg)    Physical Exam Constitutional:      Appearance: Normal appearance.  Musculoskeletal:     Right hand: Swelling and tenderness present.     Comments: Several scabbed bites noted to posterior aspect of right hand. Redness and swelling to 4th and 5th finger, posterior hand and into forearm.   Neurological:     Mental Status: He is alert.    Labs reviewed: Basic Metabolic Panel: Recent Labs    03/02/20 0621 03/03/20 0420 03/06/20 1628 03/07/20 0112 01/27/21 0410 01/29/21 0529 01/30/21 0610  NA 137   < > 134*   < > 136 136 135  K 3.7   < > 4.2   < > 4.3 3.9 3.7  CL 104   < > 103   < > 105 106 101  CO2 24   < > 23   < > 24 26 26   GLUCOSE 127*   < > 154*   < > 122* 104* 128*  BUN 39*   < > 38*   < > 68* 40* 35*  CREATININE 1.95*   < > 2.37*   < > 1.86* 1.81* 1.83*  CALCIUM 8.3*   < > 8.4*   < > 8.2* 8.3* 8.2*  TSH 0.657  --  1.355  --   --   --   --    < > = values in this interval not displayed.   Liver Function Tests: Recent Labs    03/07/20 0112 03/11/20 0608 01/26/21 1411  AST 16 14* 18  ALT 12 9 15   ALKPHOS 52 40 47  BILITOT 0.8 0.8 0.7  PROT 6.3* 6.0* 6.1*  ALBUMIN 3.7 3.3* 3.7   Recent Labs    03/01/20 1907 03/06/20 1628  LIPASE 36 31   No results for input(s): AMMONIA in  the last 8760 hours. CBC: Recent Labs    03/11/20 0608 01/26/21 1411 01/30/21 0610 01/31/21 0538 01/31/21 0952 02/01/21 0452 02/01/21 1645  WBC 6.3   < > 9.8 12.5*  --  9.9  --   NEUTROABS 4.4  --   --   --   --   --   --   HGB 10.8*   < > 7.4* 6.7* 7.2* 6.9* 9.3*  HCT 30.7*   < > 21.1* 19.2* 20.7* 19.5* 24.9*  MCV 91.4   < > 92.1 92.3  --  91.5  --   PLT 150   < > 149* 126*  --  132*  --    < > = values in this interval not displayed.   Lipid Panel: Recent Labs    11/07/20 0911  CHOL 120  HDL 61  LDLCALC 52  TRIG 37  CHOLHDL 2.0   TSH: Recent Labs    03/02/20 0621 03/06/20 1628  TSH 0.657 1.355   A1C: No results found for: HGBA1C   Assessment/Plan 1. Infected cat bite, initial encounter He reports he is up to date on tetanus vaccine, recommended to get booster if greater than 5 years.  - doxycycline (VIBRA-TABS) 100 MG tablet; Take 1 tablet (100 mg total) by mouth 2 (two) times daily for 10 days.  Dispense: 20 tablet; Refill: 0 - metroNIDAZOLE (FLAGYL) 500 MG tablet; Take 1 tablet (500 mg total) by mouth 3 (three) times daily for 10 days.  Dispense: 30 tablet; Refill: 0  2. Cellulitis of hand, right -pt with penicillin allergy therefore will treat with doxycycline and flagyl per up to date  -educated to take with food and use florastor -to hold azithromycin while on antibiotics and to resume per his PCP.  -to avoid ETOH -strict follow up precautions given and planning to follow up with PCP in 4 days for recheck.  - doxycycline (VIBRA-TABS) 100 MG tablet; Take 1 tablet (100 mg total) by mouth 2 (two) times daily for 10 days.  Dispense: 20 tablet; Refill: 0 - metroNIDAZOLE (FLAGYL) 500 MG tablet; Take 1 tablet (500 mg total) by mouth 3 (three) times daily for 10 days.  Dispense: 30 tablet; Refill: 0   Renu Asby K. [place], [facility] [phone]

## 2021-04-16 DIAGNOSIS — R809 Proteinuria, unspecified: Secondary | ICD-10-CM | POA: Insufficient documentation

## 2021-04-16 DIAGNOSIS — N1832 Chronic kidney disease, stage 3b: Secondary | ICD-10-CM | POA: Diagnosis not present

## 2021-04-16 DIAGNOSIS — I5032 Chronic diastolic (congestive) heart failure: Secondary | ICD-10-CM | POA: Diagnosis not present

## 2021-04-16 DIAGNOSIS — J9611 Chronic respiratory failure with hypoxia: Secondary | ICD-10-CM | POA: Diagnosis not present

## 2021-04-16 DIAGNOSIS — K219 Gastro-esophageal reflux disease without esophagitis: Secondary | ICD-10-CM

## 2021-04-16 DIAGNOSIS — F3342 Major depressive disorder, recurrent, in full remission: Secondary | ICD-10-CM

## 2021-04-16 DIAGNOSIS — J449 Chronic obstructive pulmonary disease, unspecified: Secondary | ICD-10-CM | POA: Diagnosis not present

## 2021-04-30 DIAGNOSIS — I5032 Chronic diastolic (congestive) heart failure: Secondary | ICD-10-CM | POA: Diagnosis not present

## 2021-04-30 DIAGNOSIS — F3340 Major depressive disorder, recurrent, in remission, unspecified: Secondary | ICD-10-CM

## 2021-04-30 DIAGNOSIS — J449 Chronic obstructive pulmonary disease, unspecified: Secondary | ICD-10-CM | POA: Diagnosis not present

## 2021-04-30 DIAGNOSIS — J9611 Chronic respiratory failure with hypoxia: Secondary | ICD-10-CM | POA: Diagnosis not present

## 2021-04-30 DIAGNOSIS — K219 Gastro-esophageal reflux disease without esophagitis: Secondary | ICD-10-CM | POA: Diagnosis not present

## 2021-05-02 DIAGNOSIS — N1832 Chronic kidney disease, stage 3b: Secondary | ICD-10-CM | POA: Insufficient documentation

## 2021-05-14 DIAGNOSIS — R21 Rash and other nonspecific skin eruption: Secondary | ICD-10-CM | POA: Diagnosis not present

## 2021-05-14 DIAGNOSIS — J9610 Chronic respiratory failure, unspecified whether with hypoxia or hypercapnia: Secondary | ICD-10-CM | POA: Diagnosis not present

## 2021-05-28 DIAGNOSIS — R079 Chest pain, unspecified: Secondary | ICD-10-CM | POA: Diagnosis not present

## 2021-06-13 DIAGNOSIS — J9610 Chronic respiratory failure, unspecified whether with hypoxia or hypercapnia: Secondary | ICD-10-CM

## 2021-06-13 DIAGNOSIS — N183 Chronic kidney disease, stage 3 unspecified: Secondary | ICD-10-CM | POA: Diagnosis not present

## 2021-06-13 DIAGNOSIS — I5030 Unspecified diastolic (congestive) heart failure: Secondary | ICD-10-CM | POA: Diagnosis not present

## 2021-06-13 DIAGNOSIS — K219 Gastro-esophageal reflux disease without esophagitis: Secondary | ICD-10-CM | POA: Diagnosis not present

## 2021-06-13 DIAGNOSIS — J449 Chronic obstructive pulmonary disease, unspecified: Secondary | ICD-10-CM | POA: Diagnosis not present

## 2021-06-13 DIAGNOSIS — F331 Major depressive disorder, recurrent, moderate: Secondary | ICD-10-CM

## 2021-06-21 ENCOUNTER — Other Ambulatory Visit: Payer: Self-pay

## 2021-06-21 ENCOUNTER — Emergency Department: Payer: Medicare PPO

## 2021-06-21 ENCOUNTER — Inpatient Hospital Stay
Admission: EM | Admit: 2021-06-21 | Discharge: 2021-07-18 | DRG: 329 | Disposition: A | Discharge status: Nursing Home (Includes ICF) | Payer: Medicare PPO | Source: Skilled Nursing Facility | Attending: Internal Medicine | Admitting: Internal Medicine

## 2021-06-21 DIAGNOSIS — N1832 Chronic kidney disease, stage 3b: Secondary | ICD-10-CM | POA: Diagnosis present

## 2021-06-21 DIAGNOSIS — D509 Iron deficiency anemia, unspecified: Secondary | ICD-10-CM | POA: Diagnosis present

## 2021-06-21 DIAGNOSIS — I130 Hypertensive heart and chronic kidney disease with heart failure and stage 1 through stage 4 chronic kidney disease, or unspecified chronic kidney disease: Secondary | ICD-10-CM | POA: Diagnosis present

## 2021-06-21 DIAGNOSIS — K5792 Diverticulitis of intestine, part unspecified, without perforation or abscess without bleeding: Secondary | ICD-10-CM | POA: Diagnosis not present

## 2021-06-21 DIAGNOSIS — J9621 Acute and chronic respiratory failure with hypoxia: Secondary | ICD-10-CM | POA: Diagnosis not present

## 2021-06-21 DIAGNOSIS — K567 Ileus, unspecified: Secondary | ICD-10-CM | POA: Diagnosis not present

## 2021-06-21 DIAGNOSIS — T380X5A Adverse effect of glucocorticoids and synthetic analogues, initial encounter: Secondary | ICD-10-CM | POA: Diagnosis not present

## 2021-06-21 DIAGNOSIS — R159 Full incontinence of feces: Secondary | ICD-10-CM | POA: Diagnosis present

## 2021-06-21 DIAGNOSIS — J189 Pneumonia, unspecified organism: Secondary | ICD-10-CM | POA: Diagnosis not present

## 2021-06-21 DIAGNOSIS — R109 Unspecified abdominal pain: Secondary | ICD-10-CM | POA: Diagnosis present

## 2021-06-21 DIAGNOSIS — J449 Chronic obstructive pulmonary disease, unspecified: Secondary | ICD-10-CM | POA: Diagnosis not present

## 2021-06-21 DIAGNOSIS — Z91199 Patient's noncompliance with other medical treatment and regimen due to unspecified reason: Secondary | ICD-10-CM

## 2021-06-21 DIAGNOSIS — K5700 Diverticulitis of small intestine with perforation and abscess without bleeding: Principal | ICD-10-CM | POA: Diagnosis present

## 2021-06-21 DIAGNOSIS — F32A Depression, unspecified: Secondary | ICD-10-CM | POA: Diagnosis present

## 2021-06-21 DIAGNOSIS — T502X5A Adverse effect of carbonic-anhydrase inhibitors, benzothiadiazides and other diuretics, initial encounter: Secondary | ICD-10-CM | POA: Diagnosis not present

## 2021-06-21 DIAGNOSIS — D631 Anemia in chronic kidney disease: Secondary | ICD-10-CM | POA: Diagnosis present

## 2021-06-21 DIAGNOSIS — N179 Acute kidney failure, unspecified: Secondary | ICD-10-CM

## 2021-06-21 DIAGNOSIS — Z66 Do not resuscitate: Secondary | ICD-10-CM | POA: Diagnosis present

## 2021-06-21 DIAGNOSIS — Z8546 Personal history of malignant neoplasm of prostate: Secondary | ICD-10-CM

## 2021-06-21 DIAGNOSIS — N281 Cyst of kidney, acquired: Secondary | ICD-10-CM | POA: Diagnosis present

## 2021-06-21 DIAGNOSIS — N189 Chronic kidney disease, unspecified: Secondary | ICD-10-CM

## 2021-06-21 DIAGNOSIS — K650 Generalized (acute) peritonitis: Secondary | ICD-10-CM | POA: Diagnosis present

## 2021-06-21 DIAGNOSIS — J810 Acute pulmonary edema: Secondary | ICD-10-CM | POA: Diagnosis not present

## 2021-06-21 DIAGNOSIS — L89312 Pressure ulcer of right buttock, stage 2: Secondary | ICD-10-CM | POA: Diagnosis present

## 2021-06-21 DIAGNOSIS — J9601 Acute respiratory failure with hypoxia: Secondary | ICD-10-CM

## 2021-06-21 DIAGNOSIS — Z87891 Personal history of nicotine dependence: Secondary | ICD-10-CM

## 2021-06-21 DIAGNOSIS — J9611 Chronic respiratory failure with hypoxia: Secondary | ICD-10-CM

## 2021-06-21 DIAGNOSIS — G9341 Metabolic encephalopathy: Secondary | ICD-10-CM | POA: Diagnosis present

## 2021-06-21 DIAGNOSIS — R0902 Hypoxemia: Secondary | ICD-10-CM

## 2021-06-21 DIAGNOSIS — Z880 Allergy status to penicillin: Secondary | ICD-10-CM

## 2021-06-21 DIAGNOSIS — R0602 Shortness of breath: Secondary | ICD-10-CM | POA: Diagnosis not present

## 2021-06-21 DIAGNOSIS — N170 Acute kidney failure with tubular necrosis: Secondary | ICD-10-CM | POA: Diagnosis not present

## 2021-06-21 DIAGNOSIS — R0600 Dyspnea, unspecified: Secondary | ICD-10-CM

## 2021-06-21 DIAGNOSIS — I5033 Acute on chronic diastolic (congestive) heart failure: Secondary | ICD-10-CM | POA: Diagnosis not present

## 2021-06-21 DIAGNOSIS — N2581 Secondary hyperparathyroidism of renal origin: Secondary | ICD-10-CM | POA: Diagnosis present

## 2021-06-21 DIAGNOSIS — Z20822 Contact with and (suspected) exposure to covid-19: Secondary | ICD-10-CM | POA: Diagnosis present

## 2021-06-21 DIAGNOSIS — K219 Gastro-esophageal reflux disease without esophagitis: Secondary | ICD-10-CM | POA: Diagnosis present

## 2021-06-21 DIAGNOSIS — J811 Chronic pulmonary edema: Secondary | ICD-10-CM

## 2021-06-21 DIAGNOSIS — J441 Chronic obstructive pulmonary disease with (acute) exacerbation: Secondary | ICD-10-CM | POA: Diagnosis not present

## 2021-06-21 DIAGNOSIS — I4891 Unspecified atrial fibrillation: Secondary | ICD-10-CM | POA: Diagnosis not present

## 2021-06-21 DIAGNOSIS — I10 Essential (primary) hypertension: Secondary | ICD-10-CM | POA: Diagnosis not present

## 2021-06-21 DIAGNOSIS — E873 Alkalosis: Secondary | ICD-10-CM | POA: Diagnosis not present

## 2021-06-21 DIAGNOSIS — K5780 Diverticulitis of intestine, part unspecified, with perforation and abscess without bleeding: Secondary | ICD-10-CM

## 2021-06-21 DIAGNOSIS — L89159 Pressure ulcer of sacral region, unspecified stage: Secondary | ICD-10-CM | POA: Diagnosis not present

## 2021-06-21 DIAGNOSIS — Z79899 Other long term (current) drug therapy: Secondary | ICD-10-CM | POA: Diagnosis not present

## 2021-06-21 DIAGNOSIS — Z8249 Family history of ischemic heart disease and other diseases of the circulatory system: Secondary | ICD-10-CM

## 2021-06-21 DIAGNOSIS — I5032 Chronic diastolic (congestive) heart failure: Secondary | ICD-10-CM

## 2021-06-21 DIAGNOSIS — Z8673 Personal history of transient ischemic attack (TIA), and cerebral infarction without residual deficits: Secondary | ICD-10-CM

## 2021-06-21 DIAGNOSIS — E1122 Type 2 diabetes mellitus with diabetic chronic kidney disease: Secondary | ICD-10-CM | POA: Diagnosis present

## 2021-06-21 DIAGNOSIS — Y95 Nosocomial condition: Secondary | ICD-10-CM | POA: Diagnosis not present

## 2021-06-21 DIAGNOSIS — L89000 Pressure ulcer of unspecified elbow, unstageable: Secondary | ICD-10-CM | POA: Diagnosis not present

## 2021-06-21 DIAGNOSIS — J440 Chronic obstructive pulmonary disease with acute lower respiratory infection: Secondary | ICD-10-CM | POA: Diagnosis present

## 2021-06-21 DIAGNOSIS — R140 Abdominal distension (gaseous): Secondary | ICD-10-CM | POA: Diagnosis not present

## 2021-06-21 DIAGNOSIS — E876 Hypokalemia: Secondary | ICD-10-CM | POA: Diagnosis present

## 2021-06-21 DIAGNOSIS — L89150 Pressure ulcer of sacral region, unstageable: Secondary | ICD-10-CM | POA: Diagnosis not present

## 2021-06-21 DIAGNOSIS — Z7189 Other specified counseling: Secondary | ICD-10-CM | POA: Diagnosis not present

## 2021-06-21 DIAGNOSIS — Z515 Encounter for palliative care: Secondary | ICD-10-CM | POA: Diagnosis not present

## 2021-06-21 DIAGNOSIS — L8990 Pressure ulcer of unspecified site, unspecified stage: Secondary | ICD-10-CM | POA: Insufficient documentation

## 2021-06-21 DIAGNOSIS — L89300 Pressure ulcer of unspecified buttock, unstageable: Secondary | ICD-10-CM | POA: Diagnosis not present

## 2021-06-21 DIAGNOSIS — Z9981 Dependence on supplemental oxygen: Secondary | ICD-10-CM

## 2021-06-21 DIAGNOSIS — I5031 Acute diastolic (congestive) heart failure: Secondary | ICD-10-CM

## 2021-06-21 DIAGNOSIS — G4733 Obstructive sleep apnea (adult) (pediatric): Secondary | ICD-10-CM | POA: Diagnosis present

## 2021-06-21 DIAGNOSIS — R0609 Other forms of dyspnea: Secondary | ICD-10-CM | POA: Diagnosis not present

## 2021-06-21 DIAGNOSIS — I2510 Atherosclerotic heart disease of native coronary artery without angina pectoris: Secondary | ICD-10-CM | POA: Diagnosis present

## 2021-06-21 DIAGNOSIS — E785 Hyperlipidemia, unspecified: Secondary | ICD-10-CM | POA: Diagnosis present

## 2021-06-21 DIAGNOSIS — E874 Mixed disorder of acid-base balance: Secondary | ICD-10-CM | POA: Diagnosis present

## 2021-06-21 DIAGNOSIS — F3340 Major depressive disorder, recurrent, in remission, unspecified: Secondary | ICD-10-CM | POA: Diagnosis not present

## 2021-06-21 LAB — COMPREHENSIVE METABOLIC PANEL
ALT: 9 U/L (ref 0–44)
AST: 13 U/L — ABNORMAL LOW (ref 15–41)
Albumin: 4.2 g/dL (ref 3.5–5.0)
Alkaline Phosphatase: 55 U/L (ref 38–126)
Anion gap: 10 (ref 5–15)
BUN: 56 mg/dL — ABNORMAL HIGH (ref 8–23)
CO2: 25 mmol/L (ref 22–32)
Calcium: 8.7 mg/dL — ABNORMAL LOW (ref 8.9–10.3)
Chloride: 96 mmol/L — ABNORMAL LOW (ref 98–111)
Creatinine, Ser: 2.65 mg/dL — ABNORMAL HIGH (ref 0.61–1.24)
GFR, Estimated: 22 mL/min — ABNORMAL LOW (ref >60)
Glucose, Bld: 114 mg/dL — ABNORMAL HIGH (ref 70–99)
Potassium: 4.3 mmol/L (ref 3.5–5.1)
Sodium: 131 mmol/L — ABNORMAL LOW (ref 135–145)
Total Bilirubin: 0.9 mg/dL (ref 0.3–1.2)
Total Protein: 7 g/dL (ref 6.5–8.1)

## 2021-06-21 LAB — CBC
HCT: 31 % — ABNORMAL LOW (ref 39.0–52.0)
Hemoglobin: 10.5 g/dL — ABNORMAL LOW (ref 13.0–17.0)
MCH: 31.5 pg (ref 26.0–34.0)
MCHC: 33.9 g/dL (ref 30.0–36.0)
MCV: 93.1 fL (ref 80.0–100.0)
Platelets: 233 10*3/uL (ref 150–400)
RBC: 3.33 MIL/uL — ABNORMAL LOW (ref 4.22–5.81)
RDW: 14.9 % (ref 11.5–15.5)
WBC: 15.7 10*3/uL — ABNORMAL HIGH (ref 4.0–10.5)
nRBC: 0 % (ref 0.0–0.2)

## 2021-06-21 LAB — LIPASE, BLOOD: Lipase: 34 U/L (ref 11–51)

## 2021-06-21 LAB — RESP PANEL BY RT-PCR (FLU A&B, COVID) ARPGX2
Influenza A by PCR: NEGATIVE
Influenza B by PCR: NEGATIVE
SARS Coronavirus 2 by RT PCR: NEGATIVE

## 2021-06-21 MED ORDER — SODIUM CHLORIDE 0.9 % IV SOLN: INTRAVENOUS | Status: DC

## 2021-06-21 MED ORDER — SODIUM CHLORIDE 0.9 % IV BOLUS
500.0000 mL | Freq: Once | INTRAVENOUS | Status: AC
  Administered 2021-06-21: 500 mL via INTRAVENOUS

## 2021-06-21 MED ORDER — METRONIDAZOLE 500 MG/100ML IV SOLN
500.0000 mg | Freq: Three times a day (TID) | INTRAVENOUS | Status: DC
  Administered 2021-06-22 – 2021-06-26 (×13): 500 mg via INTRAVENOUS
  Filled 2021-06-21 (×16): qty 100

## 2021-06-21 MED ORDER — ESCITALOPRAM OXALATE 10 MG PO TABS
10.0000 mg | ORAL_TABLET | Freq: Every day | ORAL | Status: DC
  Administered 2021-06-22 – 2021-07-18 (×27): 10 mg via ORAL
  Filled 2021-06-21 (×27): qty 1

## 2021-06-21 MED ORDER — FENTANYL CITRATE PF 50 MCG/ML IJ SOSY
50.0000 ug | PREFILLED_SYRINGE | Freq: Once | INTRAMUSCULAR | Status: AC
  Administered 2021-06-21: 50 ug via INTRAVENOUS
  Filled 2021-06-21: qty 1

## 2021-06-21 MED ORDER — SODIUM CHLORIDE 0.9 % IV SOLN
2.0000 g | Freq: Once | INTRAVENOUS | Status: AC
  Administered 2021-06-21: 2 g via INTRAVENOUS
  Filled 2021-06-21: qty 2

## 2021-06-21 MED ORDER — RISAQUAD PO CAPS
2.0000 | ORAL_CAPSULE | Freq: Three times a day (TID) | ORAL | Status: DC
  Administered 2021-06-22 – 2021-07-18 (×75): 2 via ORAL
  Filled 2021-06-21 (×76): qty 2

## 2021-06-21 MED ORDER — TRAZODONE HCL 50 MG PO TABS
150.0000 mg | ORAL_TABLET | Freq: Every day | ORAL | Status: DC
  Administered 2021-06-21 – 2021-07-17 (×24): 150 mg via ORAL
  Filled 2021-06-21 (×28): qty 1

## 2021-06-21 MED ORDER — SODIUM CHLORIDE 0.9 % IV SOLN
2.0000 g | INTRAVENOUS | Status: DC
  Administered 2021-06-23 – 2021-06-25 (×3): 2 g via INTRAVENOUS
  Filled 2021-06-21 (×4): qty 2

## 2021-06-21 MED ORDER — FERROUS SULFATE 325 (65 FE) MG PO TABS
325.0000 mg | ORAL_TABLET | Freq: Every day | ORAL | Status: DC
  Administered 2021-06-22 – 2021-07-18 (×27): 325 mg via ORAL
  Filled 2021-06-21 (×26): qty 1

## 2021-06-21 MED ORDER — ACETAMINOPHEN 325 MG PO TABS
650.0000 mg | ORAL_TABLET | Freq: Four times a day (QID) | ORAL | Status: DC | PRN
  Administered 2021-06-29 – 2021-07-06 (×2): 650 mg via ORAL
  Filled 2021-06-21 (×2): qty 2

## 2021-06-21 MED ORDER — IPRATROPIUM-ALBUTEROL 0.5-2.5 (3) MG/3ML IN SOLN: 3.0000 mL | RESPIRATORY_TRACT | Status: DC | PRN

## 2021-06-21 MED ORDER — VITAMIN D 25 MCG (1000 UNIT) PO TABS
1000.0000 [IU] | ORAL_TABLET | Freq: Every day | ORAL | Status: DC
  Administered 2021-06-22 – 2021-07-18 (×27): 1000 [IU] via ORAL
  Filled 2021-06-21 (×27): qty 1

## 2021-06-21 MED ORDER — MORPHINE SULFATE (PF) 2 MG/ML IV SOLN
2.0000 mg | INTRAVENOUS | Status: DC | PRN
  Administered 2021-06-22 – 2021-06-27 (×6): 2 mg via INTRAVENOUS
  Filled 2021-06-21 (×6): qty 1

## 2021-06-21 MED ORDER — MAGNESIUM HYDROXIDE 400 MG/5ML PO SUSP: 30.0000 mL | Freq: Every day | ORAL | Status: DC | PRN

## 2021-06-21 MED ORDER — AMLODIPINE BESYLATE 5 MG PO TABS
5.0000 mg | ORAL_TABLET | Freq: Every day | ORAL | Status: DC
  Administered 2021-06-22 – 2021-07-02 (×11): 5 mg via ORAL
  Filled 2021-06-21 (×11): qty 1

## 2021-06-21 MED ORDER — ENOXAPARIN SODIUM 40 MG/0.4ML IJ SOSY
40.0000 mg | PREFILLED_SYRINGE | INTRAMUSCULAR | Status: DC
  Administered 2021-06-22 – 2021-06-26 (×5): 40 mg via SUBCUTANEOUS
  Filled 2021-06-21 (×5): qty 0.4

## 2021-06-21 MED ORDER — ONDANSETRON HCL 4 MG/2ML IJ SOLN
4.0000 mg | Freq: Once | INTRAMUSCULAR | Status: AC
  Administered 2021-06-21: 4 mg via INTRAVENOUS
  Filled 2021-06-21: qty 2

## 2021-06-21 MED ORDER — ONDANSETRON HCL 4 MG/2ML IJ SOLN
4.0000 mg | Freq: Four times a day (QID) | INTRAMUSCULAR | Status: DC | PRN
  Administered 2021-06-25 – 2021-06-30 (×4): 4 mg via INTRAVENOUS
  Filled 2021-06-21 (×4): qty 2

## 2021-06-21 MED ORDER — TRAZODONE HCL 50 MG PO TABS: 25.0000 mg | ORAL_TABLET | Freq: Every evening | ORAL | Status: DC | PRN

## 2021-06-21 MED ORDER — HYDROCHLOROTHIAZIDE 12.5 MG PO TABS: 12.5000 mg | ORAL_TABLET | Freq: Every day | ORAL | Status: DC

## 2021-06-21 MED ORDER — APOAEQUORIN 10 MG PO CAPS: 10.0000 mg | ORAL_CAPSULE | Freq: Every day | ORAL | Status: DC

## 2021-06-21 MED ORDER — FAMOTIDINE 20 MG PO TABS
20.0000 mg | ORAL_TABLET | Freq: Two times a day (BID) | ORAL | Status: DC
  Administered 2021-06-21: 20 mg via ORAL
  Filled 2021-06-21: qty 1

## 2021-06-21 MED ORDER — LOSARTAN POTASSIUM-HCTZ 100-25 MG PO TABS: 0.5000 | ORAL_TABLET | Freq: Every day | ORAL | Status: DC

## 2021-06-21 MED ORDER — LOSARTAN POTASSIUM 50 MG PO TABS: 50.0000 mg | ORAL_TABLET | Freq: Every day | ORAL | Status: DC

## 2021-06-21 MED ORDER — ONDANSETRON HCL 4 MG PO TABS
4.0000 mg | ORAL_TABLET | Freq: Four times a day (QID) | ORAL | Status: DC | PRN
  Filled 2021-06-21: qty 1

## 2021-06-21 MED ORDER — ACETAMINOPHEN 650 MG RE SUPP
650.0000 mg | Freq: Four times a day (QID) | RECTAL | Status: DC | PRN
  Filled 2021-06-21: qty 1

## 2021-06-21 MED ORDER — METRONIDAZOLE 500 MG/100ML IV SOLN
500.0000 mg | Freq: Once | INTRAVENOUS | Status: AC
  Administered 2021-06-21: 500 mg via INTRAVENOUS
  Filled 2021-06-21: qty 100

## 2021-06-21 NOTE — ED Notes (Signed)
To radiology for ct at this time 

## 2021-06-21 NOTE — ED Triage Notes (Signed)
Pt comes via [facility] from [facility] with c/o bloated belly and abdominal pain for 1 week. Xray performed today but not results. Pt states last time this happened it was lower GI bleed.   131/51 96% 3L  HR-80  Pt has hx of copd, CHF and kidney disease.

## 2021-06-21 NOTE — ED Provider Notes (Signed)
[facility] Emergency Department Provider Note  ____________________________________________   Event Date/Time   First MD Initiated Contact with Patient 06/21/21 1853     (approximate)  I have reviewed the triage vital signs and the nursing notes.   HISTORY  Chief Complaint Abdominal Pain    HPI [facility] is a 87 y.o. male with copd on home o2, htn who comes in with abdominal pain. Reports R sided abdominal pain/and bloating for a few days, intermittent, sharp, nothing makes it better or worse. He resides at [facility] but nothing helping including pepto, tylenol, gas x. Denies worsening pain with eating. Reports pain 8/10.  Some associated nausea and 1 episode of vomiting.  Soft Bms and increased gas.   Prior GI bleed- but denies any blood in stool.           Past Medical History:  Diagnosis Date   COPD (chronic obstructive pulmonary disease) ([place])    Hypertension    Pneumonia    2012,1985 hospitalized   Prostate cancer [facility])    with seed placement   Sleep apnea    noncompliant, dx 1996   Stroke ([place]) 2012   no residuals    Patient Active Problem List   Diagnosis Date Noted   Acute GI bleeding    Polyp of sigmoid colon    Rectal bleeding    GI bleed 01/26/2021   Benign hypertensive kidney disease with chronic kidney disease 01/08/2021   Hemorrhoids    Rash    Depression    Weakness    Abdominal pain 03/06/2020   Acute kidney injury superimposed on CKD ([place]) 03/06/2020   Anemia 03/06/2020   Acute diverticulitis 03/01/2020   Pneumonia 07/17/2018   Leg swelling 12/18/2015   Chronic diastolic CHF (congestive heart failure) ([place]) 07/18/2015   Essential hypertension 07/18/2015   Prostate cancer ([place]) 06/02/2015   COPD (chronic obstructive pulmonary disease) ([place]) 03/28/2015   Sleep apnea 03/28/2015   SOB (shortness of breath) 03/28/2015   Personal history of transient ischemic attack (TIA), and cerebral infarction without  residual deficits 09/21/2014    Past Surgical History:  Procedure Laterality Date   adenoid     APPENDECTOMY     COLONOSCOPY WITH PROPOFOL N/A 01/28/2021   Procedure: COLONOSCOPY WITH PROPOFOL;  Surgeon: Lucilla Lame, MD;  Location: [facility] ENDOSCOPY;  Service: Endoscopy;  Laterality: N/A;  GI bleed   ENTEROSCOPY N/A 01/30/2021   Procedure: ENTEROSCOPY;  Surgeon: Lin Landsman, MD;  Location: [facility] ENDOSCOPY;  Service: Gastroenterology;  Laterality: N/A;   ESOPHAGOGASTRODUODENOSCOPY (EGD) WITH PROPOFOL N/A 01/27/2021   Procedure: ESOPHAGOGASTRODUODENOSCOPY (EGD) WITH PROPOFOL;  Surgeon: Lucilla Lame, MD;  Location: [facility] ENDOSCOPY;  Service: Endoscopy;  Laterality: N/A;   GIVENS CAPSULE STUDY N/A 01/28/2021   Procedure: GIVENS CAPSULE STUDY;  Surgeon: Lin Landsman, MD;  Location: [facility] ENDOSCOPY;  Service: Gastroenterology;  Laterality: N/A;   HEMORRHOID SURGERY     spine cyst     TONSILLECTOMY      Prior to Admission medications   Medication Sig Start Date End Date Taking? Authorizing Provider  acetaminophen (TYLENOL) 325 MG tablet Take 2 tablets (650 mg total) by mouth every 6 (six) hours as needed for mild pain (or Fever >/= 101). 03/13/20   Wieting, Richard, MD  acidophilus (RISAQUAD) CAPS capsule Take 2 capsules by mouth 3 (three) times daily. 02/01/21   Geradine Girt, DO  albuterol (ACCUNEB) 0.63 MG/3ML nebulizer solution 3 (three) times daily as needed. 03/31/20  [provider]  amLODipine (NORVASC) 5 MG tablet Take 5 mg by mouth daily.    [provider]  Apoaequorin (PREVAGEN) 10 MG CAPS Take 10 mg by mouth daily.    [provider]  azithromycin (ZITHROMAX) 250 MG tablet Take 250 mg by mouth 3 (three) times a week. 01/18/21   [provider]  Cholecalciferol (VITAMIN D-3) 25 MCG (1000 UT) CAPS Take 1,000 Units by mouth daily.     [provider]  escitalopram (LEXAPRO) 10 MG tablet Take 10 mg by mouth daily.     [provider]  famotidine (PEPCID) 20 MG tablet Take 20 mg by mouth 2 (two) times daily. 11/24/20   [provider]  Ferrous Sulfate (IRON PO) Take by mouth daily.    [provider]  losartan-hydrochlorothiazide (HYZAAR) 100-25 MG tablet Take 0.5 tablets by mouth daily.    [provider]  torsemide (DEMADEX) 10 MG tablet Take 10 mg by mouth daily as needed.    [provider]  traZODone (DESYREL) 150 MG tablet Take 150 mg by mouth at bedtime.    [provider]    Allergies Penicillins, Bee venom, and Darvon [propoxyphene]  Family History  Problem Relation Age of Onset   Congestive Heart Failure Mother    COPD Sister    Asthma Sister    Tuberculosis Maternal Grandmother    Stroke Maternal Grandfather     Social History Social History   Tobacco Use   Smoking status: Former    Years: 40.00    Types: Cigarettes    Quit date: 08/19/1985    Years since quitting: 35.8   Smokeless tobacco: Never  Vaping Use   Vaping Use: Never used  Substance Use Topics   Alcohol use: Yes    Alcohol/week: 14.0 standard drinks    Types: 14 Shots of liquor per week   Drug use: No      Review of Systems Constitutional: No fever/chills Eyes: No visual changes. ENT: No sore throat. Cardiovascular: Denies chest pain. Respiratory: Denies shortness of breath. Gastrointestinal: + abd pain, n/v  Genitourinary: Negative for dysuria. Musculoskeletal: Negative for back pain. Skin: Negative for rash. Neurological: Negative for headaches, focal weakness or numbness. All other ROS negative ____________________________________________   PHYSICAL EXAM:  VITAL SIGNS: ED Triage Vitals [06/21/21 1606]  Enc Vitals Group     BP (!) 131/49     Pulse Rate 69     Resp 20     Temp 98.9 F (37.2 C)     Temp Source Oral     SpO2 96 %     Weight      Height      Head Circumference      Peak Flow      Pain Score      Pain Loc      Pain Edu?      Excl.  in [place]?     Constitutional: Alert and oriented. Well appearing and in no acute distress. Eyes: Conjunctivae are normal. EOMI. Head: Atraumatic. Nose: No congestion/rhinnorhea. Mouth/Throat: Mucous membranes are moist.   Neck: No stridor. Trachea Midline. FROM Cardiovascular: Normal rate, regular rhythm. Grossly normal heart sounds.  Good peripheral circulation. Respiratory: Normal respiratory effort.  No retractions. Lungs CTAB. Gastrointestinal: Tender in the R lower abdomen. No tender on the left - Rovsing sign. No distention. No abdominal bruits.  Musculoskeletal: No lower extremity tenderness nor edema.  No joint effusions. Neurologic:  Normal speech and language.  No gross focal neurologic deficits are appreciated.  Skin:  Skin is warm, dry and intact. No rash noted. Psychiatric: Mood and affect are normal. Speech and behavior are normal. GU: Deferred   ____________________________________________   LABS (all labs ordered are listed, but only abnormal results are displayed)  Labs Reviewed  COMPREHENSIVE METABOLIC PANEL - Abnormal; Notable for the following components:      Result Value   Sodium 131 (*)    Chloride 96 (*)    Glucose, Bld 114 (*)    BUN 56 (*)    Creatinine, Ser 2.65 (*)    Calcium 8.7 (*)    AST 13 (*)    GFR, Estimated 22 (*)    All other components within normal limits  CBC - Abnormal; Notable for the following components:   WBC 15.7 (*)    RBC 3.33 (*)    Hemoglobin 10.5 (*)    HCT 31.0 (*)    All other components within normal limits  LIPASE, BLOOD  URINALYSIS, ROUTINE W REFLEX MICROSCOPIC   ____________________________________________   RADIOLOGY   Official radiology report(s): CT ABDOMEN PELVIS WO CONTRAST  Result Date: 06/21/2021 CLINICAL DATA:  87 year old with abdominal distension and right lower quadrant pain EXAM: CT ABDOMEN AND PELVIS WITHOUT CONTRAST TECHNIQUE: Multidetector CT imaging of the abdomen and pelvis was performed  following the standard protocol without IV contrast. COMPARISON:  CT 03/10/2020 FINDINGS: Lower chest: Subpleural reticular opacity and ground-glass in the right greater than left lower lobe. Resolution of previous pleural effusions. Hepatobiliary: No focal hepatic abnormality on this unenhanced exam. Unremarkable gallbladder. No calcified gallstone. No biliary dilatation. Pancreas: Age related fatty atrophy No ductal dilatation or inflammation. Spleen: Normal in size without focal abnormality. Adrenals/Urinary Tract: Normal adrenal glands. No hydronephrosis. Small upper pole right renal calculus. Small left renal cyst. Unremarkable urinary bladder. Stomach/Bowel: Decompressed stomach. Fluid-filled duodenal diverticulum without inflammation. There is no inflamed small bowel diverticula in the right abdomen, series 5, image 32 hand series 2, image 56. Adjacent fluid-filled small bowel and mesenteric edema. Small amount of mottled gas in the adjacent mesentery, series 2, image 54. This does not appear to be within the mesenteric vasculature. There is no small bowel pneumatosis. Regional small bowel are mildly prominent fluid-filled suggesting regional ileus. Fluid within the cecum and ascending colon. There is formed stool in the more distal colon. Descending and sigmoid colonic diverticulosis without colonic diverticulitis. Vascular/Lymphatic: Aortic atherosclerosis. No aortic aneurysm. There is no portal venous gas. No abdominopelvic adenopathy. Reproductive: Brachytherapy seeds in the prostate. Other: Inflammatory changes with stranding and mesenteric edema in the right small bowel mesentery. Small amount of mottled extraluminal gas related small bowel diverticulitis. No abdominopelvic ascites. No focal or drainable collection. Fat containing bilateral inguinal hernias. Musculoskeletal: Thoracolumbar spine degenerative change. There are no acute or suspicious osseous abnormalities. IMPRESSION: 1. Recurrent acute  small bowel diverticulitis in the right abdomen. Small amount of mottled extraluminal gas in the adjacent mesentery consistent with microperforation. No focal or drainable collection. 2. Regional small bowel ileus. 3. Colonic diverticulosis without diverticulitis. 4. Nonobstructing right nephrolithiasis. 5. Subpleural reticular opacity and ground-glass in the right greater than left lower lobe, favoring post infectious/inflammatory sequela. Aortic Atherosclerosis (ICD10-I70.0). Critical Value/emergent results were called by telephone at the time of interpretation on 06/21/2021 at 8:28 pm to provider [facility] , who verbally acknowledged these results. Electronically Signed   By: Keith Rake M.D.   On: 06/21/2021 20:29    ____________________________________________   PROCEDURES  Procedure(s)  performed (including Critical Care):  Procedures   ____________________________________________   INITIAL IMPRESSION / ASSESSMENT AND PLAN / ED COURSE  [facility] was evaluated in Emergency Department on 06/21/2021 for the symptoms described in the history of present illness. He was evaluated in the context of the global COVID-19 pandemic, which necessitated consideration that the patient might be at risk for infection with the SARS-CoV-2 virus that causes COVID-19. Institutional protocols and algorithms that pertain to the evaluation of patients at risk for COVID-19 are in a state of rapid change based on information released by regulatory bodies including the CDC and federal and state organizations. These policies and algorithms were followed during the patient's care in the ED.    Pt comes in with RLQ pain. Will get CT to rule out abscess, diverticulitis, SBO, perforation or other acute pathology. Will give some IV fent and zofran while waiting for CT. Labs ordered to rule out aki.  White count elevated Hemoglobin stable  Kidney function elevated from baseline will give some fluids   Patient  CT scan concerning for diverticulitis with microperforation.  Discussed with Dr. Peyton Najjar who recommends IV antibiotics.  Given it is a complicated diverticulitis will admit to the hospital team on Zosyn.  Patient does not meet sepsis criteria but does have an elevated white count.  Therefore sepsis alert was not called     ____________________________________________   FINAL CLINICAL IMPRESSION(S) / ED DIAGNOSES   Final diagnoses:  Diverticulitis  AKI (acute kidney injury) ([place])  Perforation of intestine due to diverticulitis of gastrointestinal tract      MEDICATIONS GIVEN DURING THIS VISIT:  Medications  sodium chloride 0.9 % bolus 500 mL (500 mLs Intravenous New Bag/Given 06/21/21 2042)  fentaNYL (SUBLIMAZE) injection 50 mcg (50 mcg Intravenous Given 06/21/21 2042)  ondansetron (ZOFRAN) injection 4 mg (4 mg Intravenous Given 06/21/21 2042)     ED Discharge Orders     None        Note:  This document was prepared using Dragon voice recognition software and may include unintentional dictation errors.    Vanessa [place], MD 06/21/21 2049

## 2021-06-21 NOTE — H&P (Signed)
Trevor Ferguson   PATIENT NAME: Trevor Ferguson    MR#:  [id number]  DATE OF BIRTH:  [birth year]  DATE OF ADMISSION:  06/21/2021  PRIMARY CARE PHYSICIAN: [name], MD   Patient is coming from: Home  REQUESTING/REFERRING PHYSICIAN: Marjean Donna, MD  CHIEF COMPLAINT:   Chief Complaint  Patient presents with   Abdominal Pain    HISTORY OF PRESENT ILLNESS:  Trevor Ferguson is a 87 y.o. Caucasian male with medical history significant for COPD, hypertension, sleep apnea, stage IIIb chronic kidney disease and CVA, who presented to the emergency room with acute onset of right lower quadrant abdominal pain which has been going on over the last couple weeks.  He experienced nausea and dry heaves and admitted to vomiting once a few days ago.  He he admitted to soft occasionally loose and occasionally explosive bowel movements.  His last BM was this morning.  He denied any chest pain or dyspnea or cough or wheezing.  No fever or chills.  No melena or bright red bleeding per rectum.  ED Course: Upon presentation to the ER, blood pressure was 131/49 with otherwise normal vital signs.  Labs revealed mild hyponatremia and hypochloremia and a BN of 56 with creatinine 2.65 compared to 35/1.83 on 01/30/2021.  Lipase was 34 and calcium 8.7 with otherwise unremarkable CMP.  CBC showed leukocytosis of 15.7 and anemia better than previous levels.  Imaging: Abdominal and pelvic CT scan revealed the following: 1. Recurrent acute small bowel diverticulitis in the right abdomen. Small amount of mottled extraluminal gas in the adjacent mesentery consistent with microperforation. No focal or drainable collection. 2. Regional small bowel ileus. 3. Colonic diverticulosis without diverticulitis. 4. Nonobstructing right nephrolithiasis. 5. Subpleural reticular opacity and ground-glass in the right greater than left lower lobe, favoring post infectious/inflammatory sequela. 6.  Aortic  atherosclerosis.  The patient was given IV cefepime and Flagyl, 50 mcg of IV fentanyl, 4 mg IV Zofran and 500 mill IV normal saline bolus.  Dr. Peyton Najjar was notified about the patient.  She will be admitted to a medical bed for further evaluation and management PAST MEDICAL HISTORY:   Past Medical History:  Diagnosis Date   COPD (chronic obstructive pulmonary disease) ([place])    Hypertension    Pneumonia    2012,1985 hospitalized   Prostate cancer ([place])    with seed placement   Sleep apnea    noncompliant, dx 1996   Stroke ([place]) 2012   no residuals  -Stage IIIb chronic kidney disease  PAST SURGICAL HISTORY:   Past Surgical History:  Procedure Laterality Date   adenoid     APPENDECTOMY     COLONOSCOPY WITH PROPOFOL N/A 01/28/2021   Procedure: COLONOSCOPY WITH PROPOFOL;  Surgeon: Lucilla Lame, MD;  Location: [facility] ENDOSCOPY;  Service: Endoscopy;  Laterality: N/A;  GI bleed   ENTEROSCOPY N/A 01/30/2021   Procedure: ENTEROSCOPY;  Surgeon: Lin Landsman, MD;  Location: [facility] ENDOSCOPY;  Service: Gastroenterology;  Laterality: N/A;   ESOPHAGOGASTRODUODENOSCOPY (EGD) WITH PROPOFOL N/A 01/27/2021   Procedure: ESOPHAGOGASTRODUODENOSCOPY (EGD) WITH PROPOFOL;  Surgeon: Lucilla Lame, MD;  Location[facility] END[facility]COPY;  Service: Endoscopy;  Laterality: N/A;   GIVENS CAPSULE STUDY N/A 01/28/2021   Procedure: GIVENS CAPSULE STUDY;  Surgeon: Lin Landsman, MD;  Location: [facility] ENDOSCOPY;  Service: Gastroenterology;  Laterality: N/A;   HEMORRHOID SURGERY     spine cyst     TONSILLECTOMY      SOCIAL HISTORY:   Social History  Tobacco Use   Smoking status: Former    Years: 40.00    Types: Cigarettes    Quit date: 08/19/1985    Years since quitting: 35.8   Smokeless tobacco: Never  Substance Use Topics   Alcohol use: Yes    Alcohol/week: 14.0 standard drinks    Types: 14 Shots of liquor per week    FAMILY HISTORY:   Family History  Problem Relation Age of Onset   Congestive Heart  Failure Mother    COPD Sister    Asthma Sister    Tuberculosis Maternal Grandmother    Stroke Maternal Grandfather     DRUG ALLERGIES:   Allergies  Allergen Reactions   Penicillins Hives, Rash and Other (See Comments)    Rash on hands and blisters on ankles when 87 years old Patient tolerates Zosyn   Bee Venom Palpitations    INSECT BITES/STINGS   Darvon [Propoxyphene] Nausea Only and Rash    REVIEW OF SYSTEMS:   ROS As per history of present illness. All pertinent systems were reviewed above. Constitutional, HEENT, cardiovascular, respiratory, GI, GU, musculoskeletal, neuro, psychiatric, endocrine, integumentary and hematologic systems were reviewed and are otherwise negative/unremarkable except for positive findings mentioned above in the HPI.   MEDICATIONS AT HOME:   Prior to Admission medications   Medication Sig Start Date End Date Taking? Authorizing Provider  acetaminophen (TYLENOL) 325 MG tablet Take 2 tablets (650 mg total) by mouth every 6 (six) hours as needed for mild pain (or Fever >/= 101). 03/13/20   Wieting, Richard, MD  acidophilus (RISAQUAD) CAPS capsule Take 2 capsules by mouth 3 (three) times daily. 02/01/21   Geradine Girt, DO  albuterol (ACCUNEB) 0.63 MG/3ML nebulizer solution 3 (three) times daily as needed. 03/31/20   [provider]  amLODipine (NORVASC) 5 MG tablet Take 5 mg by mouth daily.    [provider]  Apoaequorin (PREVAGEN) 10 MG CAPS Take 10 mg by mouth daily.    [provider]  azithromycin (ZITHROMAX) 250 MG tablet Take 250 mg by mouth 3 (three) times a week. 01/18/21   [provider]  Cholecalciferol (VITAMIN D-3) 25 MCG (1000 UT) CAPS Take 1,000 Units by mouth daily.     [provider]  escitalopram (LEXAPRO) 10 MG tablet Take 10 mg by mouth daily.     [provider]  famotidine (PEPCID) 20 MG tablet Take 20 mg by mouth 2 (two) times daily. 11/24/20   [provider]  Ferrous  Sulfate (IRON PO) Take by mouth daily.    [provider]  losartan-hydrochlorothiazide (HYZAAR) 100-25 MG tablet Take 0.5 tablets by mouth daily.    [provider]  torsemide (DEMADEX) 10 MG tablet Take 10 mg by mouth daily as needed.    [provider]  traZODone (DESYREL) 150 MG tablet Take 150 mg by mouth at bedtime.    [provider]      VITAL SIGNS:  Blood pressure (!) 127/59, pulse 78, temperature 98.9 F (37.2 C), temperature source Oral, resp. rate 17, SpO2 96 %.  PHYSICAL EXAMINATION:  Physical Exam  GENERAL:  87 y.o.-year-old patient lying in the bed with no acute distress.  EYES: Pupils equal, round, reactive to light and accommodation. No scleral icterus. Extraocular muscles intact.  HEENT: Head atraumatic, normocephalic. Oropharynx and nasopharynx clear.  NECK:  Supple, no jugular venous distention. No thyroid enlargement, no tenderness.  LUNGS: Normal breath sounds bilaterally, no wheezing, rales,rhonchi or crepitation. No use of accessory  muscles of respiration.  CARDIOVASCULAR: Regular rate and rhythm, S1, S2 normal. No murmurs, rubs, or gallops.  ABDOMEN: Soft, nondistended, with right lower quadrant tenderness without rebound tenderness guarding or rigidity.  Bowel sounds present. No organomegaly or mass.  EXTREMITIES: No pedal edema, cyanosis, or clubbing.  NEUROLOGIC: Cranial nerves II through XII are intact. Muscle strength 5/5 in all extremities. Sensation intact. Gait not checked.  PSYCHIATRIC: The patient is alert and oriented x 3.  Normal affect and good eye contact. SKIN: No obvious rash, lesion, or ulcer.   LABORATORY PANEL:   CBC Recent Labs  Lab 06/21/21 1621  WBC 15.7*  HGB 10.5*  HCT 31.0*  PLT 233   ------------------------------------------------------------------------------------------------------------------  Chemistries  Recent Labs  Lab 06/21/21 1621  NA 131*  K 4.3  CL 96*  CO2 25  GLUCOSE  114*  BUN 56*  CREATININE 2.65*  CALCIUM 8.7*  AST 13*  ALT 9  ALKPHOS 55  BILITOT 0.9   ------------------------------------------------------------------------------------------------------------------  Cardiac Enzymes No results for input(s): TROPONINI in the last 168 hours. ------------------------------------------------------------------------------------------------------------------  RADIOLOGY:  CT ABDOMEN PELVIS WO CONTRAST  Result Date: 06/21/2021 CLINICAL DATA:  87 year old with abdominal distension and right lower quadrant pain EXAM: CT ABDOMEN AND PELVIS WITHOUT CONTRAST TECHNIQUE: Multidetector CT imaging of the abdomen and pelvis was performed following the standard protocol without IV contrast. COMPARISON:  CT 03/10/2020 FINDINGS: Lower chest: Subpleural reticular opacity and ground-glass in the right greater than left lower lobe. Resolution of previous pleural effusions. Hepatobiliary: No focal hepatic abnormality on this unenhanced exam. Unremarkable gallbladder. No calcified gallstone. No biliary dilatation. Pancreas: Age related fatty atrophy No ductal dilatation or inflammation. Spleen: Normal in size without focal abnormality. Adrenals/Urinary Tract: Normal adrenal glands. No hydronephrosis. Small upper pole right renal calculus. Small left renal cyst. Unremarkable urinary bladder. Stomach/Bowel: Decompressed stomach. Fluid-filled duodenal diverticulum without inflammation. There is no inflamed small bowel diverticula in the right abdomen, series 5, image 32 hand series 2, image 56. Adjacent fluid-filled small bowel and mesenteric edema. Small amount of mottled gas in the adjacent mesentery, series 2, image 54. This does not appear to be within the mesenteric vasculature. There is no small bowel pneumatosis. Regional small bowel are mildly prominent fluid-filled suggesting regional ileus. Fluid within the cecum and ascending colon. There is formed stool in the more distal  colon. Descending and sigmoid colonic diverticulosis without colonic diverticulitis. Vascular/Lymphatic: Aortic atherosclerosis. No aortic aneurysm. There is no portal venous gas. No abdominopelvic adenopathy. Reproductive: Brachytherapy seeds in the prostate. Other: Inflammatory changes with stranding and mesenteric edema in the right small bowel mesentery. Small amount of mottled extraluminal gas related small bowel diverticulitis. No abdominopelvic ascites. No focal or drainable collection. Fat containing bilateral inguinal hernias. Musculoskeletal: Thoracolumbar spine degenerative change. There are no acute or suspicious osseous abnormalities. IMPRESSION: 1. Recurrent acute small bowel diverticulitis in the right abdomen. Small amount of mottled extraluminal gas in the adjacent mesentery consistent with microperforation. No focal or drainable collection. 2. Regional small bowel ileus. 3. Colonic diverticulosis without diverticulitis. 4. Nonobstructing right nephrolithiasis. 5. Subpleural reticular opacity and ground-glass in the right greater than left lower lobe, favoring post infectious/inflammatory sequela. Aortic Atherosclerosis (ICD10-I70.0). Critical Value/emergent results were called by telephone at the time of interpretation on 06/21/2021 at 8:28 pm to provider [facility] , who verbally acknowledged these results. Electronically Signed   By: Keith Rake M.D.   On: 06/21/2021 20:29      IMPRESSION AND PLAN:  Active Problems:   Acute  diverticulitis  1.  Recurrent acute small bowel diverticulitis with suspected microperforation. - The patient will be admitted to a medical bed. - We will continue antibiotic therapy with IV cefepime and Flagyl. - Pain management to be provided. - General surgery consult will be obtained for follow-up.  2.  Acute kidney injury superimposed on stage IIIb chronic kidney disease. - The patient will be hydrated with IV normal saline. - We will follow BMP. - We  will hold off nephrotoxins.  3.  COPD without exacerbation. - We will place the patient on as needed DuoNebs and continue Pulmicort.  4.  Essential hypertension. - We will continue Norvasc. - We will hold off Hyzaar given acute kidney injury.  5.  Depression. - We will continue Lexapro.  6.  GERD. - We will continue PPI therapy.  7.  Chronic anemia. - We will continue ferrous sulfate.  DVT prophylaxis: Lovenox. Code Status: full code. Family Communication:  The plan of care was discussed in details with the patient (and family). I answered all questions. The patient agreed to proceed with the above mentioned plan. Further management will depend upon hospital course. Disposition Plan: Back to previous home environment Consults called: General surgery. All the records are reviewed and case discussed with ED provider.  Status is: Inpatient  Remains inpatient appropriate because:Ongoing diagnostic testing needed not appropriate for outpatient work up, Unsafe d/c plan, IV treatments appropriate due to intensity of illness or inability to take PO, and Inpatient level of care appropriate due to severity of illness   Dispo: The patient is from: Home              Anticipated d/c is to: Home              Patient currently is not medically stable to d/c.              Difficult to place patient: No  TOTAL TIME TAKING CARE OF THIS PATIENT: 55 minutes.     Christel Mormon M.D on 06/21/2021 at 9:44 PM  [facility]   From 7 PM-7 AM, contact night-coverage [email or web address]  CC: Primary care physician; [name], MD

## 2021-06-21 NOTE — ED Notes (Signed)
ERMD at bedside at this time 

## 2021-06-22 ENCOUNTER — Encounter: Payer: Self-pay | Admitting: Family Medicine

## 2021-06-22 ENCOUNTER — Inpatient Hospital Stay: Payer: Medicare PPO | Admitting: Certified Registered Nurse Anesthetist

## 2021-06-22 ENCOUNTER — Encounter
Admission: EM | Disposition: A | Discharge status: Nursing Home (Includes ICF) | Payer: Self-pay | Source: Skilled Nursing Facility | Attending: Student in an Organized Health Care Education/Training Program

## 2021-06-22 DIAGNOSIS — K5792 Diverticulitis of intestine, part unspecified, without perforation or abscess without bleeding: Secondary | ICD-10-CM | POA: Diagnosis not present

## 2021-06-22 HISTORY — PX: LAPAROTOMY: SHX154

## 2021-06-22 HISTORY — PX: BOWEL RESECTION: SHX1257

## 2021-06-22 LAB — CBC
HCT: 27.3 % — ABNORMAL LOW (ref 39.0–52.0)
Hemoglobin: 9.2 g/dL — ABNORMAL LOW (ref 13.0–17.0)
MCH: 30.8 pg (ref 26.0–34.0)
MCHC: 33.7 g/dL (ref 30.0–36.0)
MCV: 91.3 fL (ref 80.0–100.0)
Platelets: 195 10*3/uL (ref 150–400)
RBC: 2.99 MIL/uL — ABNORMAL LOW (ref 4.22–5.81)
RDW: 14.8 % (ref 11.5–15.5)
WBC: 12.2 10*3/uL — ABNORMAL HIGH (ref 4.0–10.5)
nRBC: 0 % (ref 0.0–0.2)

## 2021-06-22 LAB — URINALYSIS, ROUTINE W REFLEX MICROSCOPIC
Bacteria, UA: NONE SEEN
Bilirubin Urine: NEGATIVE
Glucose, UA: NEGATIVE mg/dL
Hgb urine dipstick: NEGATIVE
Ketones, ur: NEGATIVE mg/dL
Leukocytes,Ua: NEGATIVE
Nitrite: NEGATIVE
Protein, ur: 30 mg/dL — AB
Specific Gravity, Urine: 1.016 (ref 1.005–1.030)
Squamous Epithelial / HPF: NONE SEEN (ref 0–5)
pH: 5 (ref 5.0–8.0)

## 2021-06-22 LAB — BASIC METABOLIC PANEL
Anion gap: 7 (ref 5–15)
BUN: 54 mg/dL — ABNORMAL HIGH (ref 8–23)
CO2: 24 mmol/L (ref 22–32)
Calcium: 8.2 mg/dL — ABNORMAL LOW (ref 8.9–10.3)
Chloride: 103 mmol/L (ref 98–111)
Creatinine, Ser: 2.35 mg/dL — ABNORMAL HIGH (ref 0.61–1.24)
GFR, Estimated: 25 mL/min — ABNORMAL LOW (ref >60)
Glucose, Bld: 101 mg/dL — ABNORMAL HIGH (ref 70–99)
Potassium: 4.2 mmol/L (ref 3.5–5.1)
Sodium: 134 mmol/L — ABNORMAL LOW (ref 135–145)

## 2021-06-22 SURGERY — LAPAROTOMY, EXPLORATORY
Anesthesia: General

## 2021-06-22 MED ORDER — LIDOCAINE HCL (PF) 2 % IJ SOLN
INTRAMUSCULAR | Status: AC
  Filled 2021-06-22: qty 5

## 2021-06-22 MED ORDER — PHENYLEPHRINE HCL (PRESSORS) 10 MG/ML IV SOLN
INTRAVENOUS | Status: DC | PRN
  Administered 2021-06-22: 80 ug via INTRAVENOUS
  Administered 2021-06-22: 160 ug via INTRAVENOUS
  Administered 2021-06-22 (×2): 80 ug via INTRAVENOUS
  Administered 2021-06-22: 160 ug via INTRAVENOUS

## 2021-06-22 MED ORDER — PROPOFOL 10 MG/ML IV BOLUS
INTRAVENOUS | Status: AC
  Filled 2021-06-22: qty 20

## 2021-06-22 MED ORDER — ONDANSETRON HCL 4 MG/2ML IJ SOLN: 4.0000 mg | Freq: Once | INTRAMUSCULAR | Status: DC | PRN

## 2021-06-22 MED ORDER — SODIUM CHLORIDE (PF) 0.9 % IJ SOLN
INTRAMUSCULAR | Status: DC | PRN
  Administered 2021-06-22: 100 mL

## 2021-06-22 MED ORDER — KETAMINE HCL 50 MG/5ML IJ SOSY
PREFILLED_SYRINGE | INTRAMUSCULAR | Status: AC
  Filled 2021-06-22: qty 5

## 2021-06-22 MED ORDER — CEFAZOLIN SODIUM-DEXTROSE 2-3 GM-%(50ML) IV SOLR
INTRAVENOUS | Status: DC | PRN
  Administered 2021-06-22: 2 g via INTRAVENOUS

## 2021-06-22 MED ORDER — EPHEDRINE SULFATE 50 MG/ML IJ SOLN
INTRAMUSCULAR | Status: DC | PRN
Start: 2021-06-22 — End: 2021-06-22
  Administered 2021-06-22: 10 mg via INTRAVENOUS
  Administered 2021-06-22 (×2): 5 mg via INTRAVENOUS

## 2021-06-22 MED ORDER — ACETAMINOPHEN 10 MG/ML IV SOLN
INTRAVENOUS | Status: AC
  Filled 2021-06-22: qty 100

## 2021-06-22 MED ORDER — ROCURONIUM BROMIDE 100 MG/10ML IV SOLN
INTRAVENOUS | Status: DC | PRN
  Administered 2021-06-22: 50 mg via INTRAVENOUS

## 2021-06-22 MED ORDER — DEXAMETHASONE SODIUM PHOSPHATE 10 MG/ML IJ SOLN
INTRAMUSCULAR | Status: AC
  Filled 2021-06-22: qty 1

## 2021-06-22 MED ORDER — FENTANYL CITRATE (PF) 100 MCG/2ML IJ SOLN
25.0000 ug | INTRAMUSCULAR | Status: DC | PRN
  Administered 2021-06-22: 50 ug via INTRAVENOUS
  Administered 2021-06-22 (×2): 25 ug via INTRAVENOUS

## 2021-06-22 MED ORDER — ACETAMINOPHEN 10 MG/ML IV SOLN
INTRAVENOUS | Status: DC | PRN
  Administered 2021-06-22: 1000 mg via INTRAVENOUS

## 2021-06-22 MED ORDER — FENTANYL CITRATE (PF) 100 MCG/2ML IJ SOLN
INTRAMUSCULAR | Status: AC
  Administered 2021-06-22: 25 ug via INTRAVENOUS
  Filled 2021-06-22: qty 2

## 2021-06-22 MED ORDER — KETOROLAC TROMETHAMINE 30 MG/ML IJ SOLN
30.0000 mg | Freq: Once | INTRAMUSCULAR | Status: AC
  Administered 2021-06-22: 30 mg via INTRAVENOUS

## 2021-06-22 MED ORDER — PROPOFOL 10 MG/ML IV BOLUS
INTRAVENOUS | Status: DC | PRN
  Administered 2021-06-22: 120 mg via INTRAVENOUS

## 2021-06-22 MED ORDER — KETOROLAC TROMETHAMINE 30 MG/ML IJ SOLN
INTRAMUSCULAR | Status: AC
  Filled 2021-06-22: qty 1

## 2021-06-22 MED ORDER — FENTANYL CITRATE (PF) 100 MCG/2ML IJ SOLN
INTRAMUSCULAR | Status: AC
  Filled 2021-06-22: qty 2

## 2021-06-22 MED ORDER — SODIUM CHLORIDE (PF) 0.9 % IJ SOLN
INTRAMUSCULAR | Status: AC
  Filled 2021-06-22: qty 50

## 2021-06-22 MED ORDER — FENTANYL CITRATE (PF) 100 MCG/2ML IJ SOLN
INTRAMUSCULAR | Status: DC | PRN
  Administered 2021-06-22 (×2): 50 ug via INTRAVENOUS

## 2021-06-22 MED ORDER — BUPIVACAINE LIPOSOME 1.3 % IJ SUSP
INTRAMUSCULAR | Status: AC
  Filled 2021-06-22: qty 20

## 2021-06-22 MED ORDER — ONDANSETRON HCL 4 MG/2ML IJ SOLN
INTRAMUSCULAR | Status: AC
  Filled 2021-06-22: qty 2

## 2021-06-22 MED ORDER — LIDOCAINE HCL (CARDIAC) PF 100 MG/5ML IV SOSY
PREFILLED_SYRINGE | INTRAVENOUS | Status: DC | PRN
  Administered 2021-06-22: 100 mg via INTRAVENOUS

## 2021-06-22 MED ORDER — DEXAMETHASONE SODIUM PHOSPHATE 10 MG/ML IJ SOLN
INTRAMUSCULAR | Status: DC | PRN
  Administered 2021-06-22: 10 mg via INTRAVENOUS

## 2021-06-22 MED ORDER — KETAMINE HCL 50 MG/ML IJ SOLN
INTRAMUSCULAR | Status: DC | PRN
  Administered 2021-06-22: 30 mg via INTRAMUSCULAR

## 2021-06-22 MED ORDER — FENTANYL CITRATE PF 50 MCG/ML IJ SOSY: 25.0000 ug | PREFILLED_SYRINGE | INTRAMUSCULAR | Status: DC | PRN

## 2021-06-22 MED ORDER — BUPIVACAINE-EPINEPHRINE (PF) 0.5% -1:200000 IJ SOLN
INTRAMUSCULAR | Status: AC
  Filled 2021-06-22: qty 30

## 2021-06-22 MED ORDER — FAMOTIDINE 20 MG PO TABS
10.0000 mg | ORAL_TABLET | Freq: Two times a day (BID) | ORAL | Status: DC
  Administered 2021-06-22 – 2021-07-18 (×51): 10 mg via ORAL
  Filled 2021-06-22 (×54): qty 1

## 2021-06-22 MED ORDER — SUCCINYLCHOLINE CHLORIDE 200 MG/10ML IV SOSY
PREFILLED_SYRINGE | INTRAVENOUS | Status: DC | PRN
  Administered 2021-06-22: 100 mg via INTRAVENOUS

## 2021-06-22 SURGICAL SUPPLY — 33 items
CHLORAPREP W/TINT 26 (MISCELLANEOUS) ×2 IMPLANT
DRAPE LAPAROTOMY 100X77 ABD (DRAPES) ×2 IMPLANT
DRSG OPSITE POSTOP 4X10 (GAUZE/BANDAGES/DRESSINGS) ×2 IMPLANT
ELECT REM PT RETURN 9FT ADLT (ELECTROSURGICAL) ×2
ELECTRODE REM PT RTRN 9FT ADLT (ELECTROSURGICAL) ×1 IMPLANT
GAUZE 4X4 16PLY ~~LOC~~+RFID DBL (SPONGE) ×2 IMPLANT
GLOVE SURG ENC MOIS LTX SZ6.5 (GLOVE) ×2 IMPLANT
GLOVE SURG UNDER POLY LF SZ6.5 (GLOVE) ×2 IMPLANT
GOWN STRL REUS W/ TWL LRG LVL3 (GOWN DISPOSABLE) ×2 IMPLANT
GOWN STRL REUS W/TWL LRG LVL3 (GOWN DISPOSABLE) ×2
KIT TURNOVER KIT A (KITS) ×2 IMPLANT
LABEL OR SOLS (LABEL) ×2 IMPLANT
MANIFOLD NEPTUNE II (INSTRUMENTS) ×2 IMPLANT
NEEDLE HYPO 22GX1.5 SAFETY (NEEDLE) ×2 IMPLANT
NS IRRIG 1000ML POUR BTL (IV SOLUTION) ×2 IMPLANT
PACK BASIN MAJOR ARMC (MISCELLANEOUS) ×2 IMPLANT
PACK COLON CLEAN CLOSURE (MISCELLANEOUS) ×2 IMPLANT
RELOAD LINEAR CUT PROX 55 BLUE (ENDOMECHANICALS) ×4 IMPLANT
RETRACTOR WND ALEXIS-O 25 LRG (MISCELLANEOUS) ×1 IMPLANT
RTRCTR WOUND ALEXIS O 25CM LRG (MISCELLANEOUS) ×2
SEALER TISSUE X1 CVD JAW (INSTRUMENTS) ×2 IMPLANT
SPONGE T-LAP 18X18 ~~LOC~~+RFID (SPONGE) ×6 IMPLANT
STAPLER GUN LINEAR PROX 60 (STAPLE) ×2 IMPLANT
STAPLER PROXIMATE 55 BLUE (STAPLE) ×2 IMPLANT
SUT SILK 2 0 (SUTURE) ×1
SUT SILK 2-0 18XBRD TIE 12 (SUTURE) ×1 IMPLANT
SUT SILK 3 0 (SUTURE) ×1
SUT SILK 3-0 18XBRD TIE 12 (SUTURE) ×1 IMPLANT
SUT VIC AB 3-0 SH 27 (SUTURE) ×3
SUT VIC AB 3-0 SH 27X BRD (SUTURE) ×3 IMPLANT
SYR 20ML LL LF (SYRINGE) ×4 IMPLANT
TRAY FOLEY MTR SLVR 16FR STAT (SET/KITS/TRAYS/PACK) ×2 IMPLANT
WATER STERILE IRR 500ML POUR (IV SOLUTION) ×2 IMPLANT

## 2021-06-22 NOTE — Progress Notes (Signed)
PROGRESS NOTE    OCIEL RETHERFORD  BMW:[id number] DOB: 15-Jul-1926 DOA: 06/21/2021 PCP: [name], MD   Brief Narrative:  This 87 years old male with PMH significant for COPD, hypertension, sleep apnea, CKD stage IIIb and history of CVA presenting the ED with acute onset of  right lower quadrant abdominal pain which is ongoing for last couple of weeks.  CT abdomen showed recurrent acute small bowel diverticulitis with small amount of mottled extraluminal gas in the mesentery consistent with microperforation.  There is no focal or drainable collection.  Regional small bowel ileus.  General surgery was consulted, Patient is going to have exploratory laparotomy today.  Assessment & Plan:   Active Problems:   Acute diverticulitis  Recurrent acute small bowel diverticulitis with suspected microperforation: Patient presented with abdominal pain associated with nausea vomiting. CT shows diverticulitis with suspected microperforation. Continue IV antibiotics cefepime and Flagyl. Continue IV hydration and adequate pain control. Surgery consulted, patient is a scheduled to have exploratory laparotomy on 11/4. Follow-up postoperative care.  AKI on CKD stage IIIb: Continue IV hydration.  Monitor serum creatinine. Avoid nephrotoxic medications.  COPD without exacerbation: Continue as needed DuoNeb and Pulmicort.  Essential hypertension: Continue Norvasc.  We will hold Hyzaar given AKI.  Depression: Continue Lexapro  GERD:  Continue Protonix  Chronic anemia: Continue ferrous sulfate  DVT prophylaxis: Lovenox Code Status: DNR Family Communication: No family at bedside Disposition Plan:   Status is: Inpatient  Remains inpatient appropriate because: Acute diverticulitis with microperforation requiring exploratory laparotomy.  Anticipated discharge home in few days.  Consultants:  General surgery  Procedures: Exploratory laparotomy 11/4 Antimicrobials:   Anti-infectives  (From admission, onward)    Start     Dose/Rate Route Frequency Ordered Stop   06/22/21 2100  ceFEPIme (MAXIPIME) 2 g in sodium chloride 0.9 % 100 mL IVPB        2 g 200 mL/hr over 30 Minutes Intravenous Every 24 hours 06/21/21 2331     06/22/21 0600  metroNIDAZOLE (FLAGYL) IVPB 500 mg        500 mg 100 mL/hr over 60 Minutes Intravenous Every 8 hours 06/21/21 2331     06/21/21 2100  ceFEPIme (MAXIPIME) 2 g in sodium chloride 0.9 % 100 mL IVPB        2 g 200 mL/hr over 30 Minutes Intravenous  Once 06/21/21 2050 06/21/21 2130   06/21/21 2100  metroNIDAZOLE (FLAGYL) IVPB 500 mg        500 mg 100 mL/hr over 60 Minutes Intravenous  Once 06/21/21 2050 06/21/21 2204        Subjective: Patient is seen and examined at bedside.  Overnight events noted.   Patient still reports having lower abdominal pain, denies any nausea and vomiting.  Objective: Vitals:   06/22/21 0500 06/22/21 0600 06/22/21 0700 06/22/21 1200  BP: (!) 118/44 (!) 92/50 (!) 114/40 (!) 129/59  Pulse: 64 (!) 59 67 76  Resp:   16 16  Temp:      TempSrc:      SpO2: 93% 91% 97% 94%  Weight:      Height:        Intake/Output Summary (Last 24 hours) at 06/22/2021 1453 Last data filed at 06/22/2021 1205 Gross per 24 hour  Intake 885.87 ml  Output 1050 ml  Net -164.13 ml   Filed Weights   06/22/21 0406  Weight: 94.3 kg    Examination:  General exam: Appears comfortable, not in any acute distress. Respiratory system:  Clear to auscultation. Respiratory effort normal. Cardiovascular system: S1-S2 heard, regular rate and rhythm, no murmur.   Gastrointestinal system: Abdomen is soft, distended, mildly tender, BS + Central nervous system: Alert and oriented X3. No focal neurological deficits. Extremities: No edema, no cyanosis, no clubbing. Skin: No rashes, lesions or ulcers Psychiatry: Judgement and insight appear normal. Mood & affect appropriate.     Data Reviewed: I have personally reviewed following labs and  imaging studies  CBC: Recent Labs  Lab 06/21/21 1621 06/22/21 0515  WBC 15.7* 12.2*  HGB 10.5* 9.2*  HCT 31.0* 27.3*  MCV 93.1 91.3  PLT 233 119   Basic Metabolic Panel: Recent Labs  Lab 06/21/21 1621 06/22/21 0515  NA 131* 134*  K 4.3 4.2  CL 96* 103  CO2 25 24  GLUCOSE 114* 101*  BUN 56* 54*  CREATININE 2.65* 2.35*  CALCIUM 8.7* 8.2*   GFR: Estimated Creatinine Clearance: 21.9 mL/min (A) (by C-G formula based on SCr of 2.35 mg/dL (H)). Liver Function Tests: Recent Labs  Lab 06/21/21 1621  AST 13*  ALT 9  ALKPHOS 55  BILITOT 0.9  PROT 7.0  ALBUMIN 4.2   Recent Labs  Lab 06/21/21 1621  LIPASE 34   No results for input(s): AMMONIA in the last 168 hours. Coagulation Profile: No results for input(s): INR, PROTIME in the last 168 hours. Cardiac Enzymes: No results for input(s): CKTOTAL, CKMB, CKMBINDEX, TROPONINI in the last 168 hours. BNP (last 3 results) No results for input(s): PROBNP in the last 8760 hours. HbA1C: No results for input(s): HGBA1C in the last 72 hours. CBG: No results for input(s): GLUCAP in the last 168 hours. Lipid Profile: No results for input(s): CHOL, HDL, LDLCALC, TRIG, CHOLHDL, LDLDIRECT in the last 72 hours. Thyroid Function Tests: No results for input(s): TSH, T4TOTAL, FREET4, T3FREE, THYROIDAB in the last 72 hours. Anemia Panel: No results for input(s): VITAMINB12, FOLATE, FERRITIN, TIBC, IRON, RETICCTPCT in the last 72 hours. Sepsis Labs: No results for input(s): PROCALCITON, LATICACIDVEN in the last 168 hours.  Recent Results (from the past 240 hour(s))  Resp Panel by RT-PCR (Flu A&B, Covid) Nasopharyngeal Swab     Status: None   Collection Time: 06/21/21  8:38 PM   Specimen: Nasopharyngeal Swab; Nasopharyngeal(NP) swabs in vial transport medium  Result Value Ref Range Status   SARS Coronavirus 2 by RT PCR NEGATIVE NEGATIVE Final    Comment: (NOTE) SARS-CoV-2 target nucleic acids are NOT DETECTED.  The SARS-CoV-2 RNA  is generally detectable in upper respiratory specimens during the acute phase of infection. The lowest concentration of SARS-CoV-2 viral copies this assay can detect is 138 copies/mL. A negative result does not preclude SARS-Cov-2 infection and should not be used as the sole basis for treatment or other patient management decisions. A negative result may occur with  improper specimen collection/handling, submission of specimen other than nasopharyngeal swab, presence of viral mutation(s) within the areas targeted by this assay, and inadequate number of viral copies(<138 copies/mL). A negative result must be combined with clinical observations, patient history, and epidemiological information. The expected result is Negative.  Fact Sheet for Patients:  EntrepreneurPulse.com.au  Fact Sheet for Healthcare Providers:  IncredibleEmployment.be  This test is no t yet approved or cleared by the [place] FDA and  has been authorized for detection and/or diagnosis of SARS-CoV-2 by FDA under an Emergency Use Authorization (EUA). This EUA will remain  in effect (meaning this test can be used) for the duration of the COVID-19  declaration under Section 564(b)(1) of the Act, 21 U.S.C.section 360bbb-3(b)(1), unless the authorization is terminated  or revoked sooner.       Influenza A by PCR NEGATIVE NEGATIVE Final   Influenza B by PCR NEGATIVE NEGATIVE Final    Comment: (NOTE) The Xpert Xpress SARS-CoV-2/FLU/RSV plus assay is intended as an aid in the diagnosis of influenza from Nasopharyngeal swab specimens and should not be used as a sole basis for treatment. Nasal washings and aspirates are unacceptable for Xpert Xpress SARS-CoV-2/FLU/RSV testing.  Fact Sheet for Patients: EntrepreneurPulse.com.au  Fact Sheet for Healthcare Providers: IncredibleEmployment.be  This test is not yet approved or cleared by the  [place] FDA and has been authorized for detection and/or diagnosis of SARS-CoV-2 by FDA under an Emergency Use Authorization (EUA). This EUA will remain in effect (meaning this test can be used) for the duration of the COVID-19 declaration under Section 564(b)(1) of the Act, 21 U.S.C. section 360bbb-3(b)(1), unless the authorization is terminated or revoked.  Performed at [facility], [address], [place], [place] [address]     Radiology Studies: CT ABDOMEN PELVIS WO CONTRAST  Result Date: 06/21/2021 CLINICAL DATA:  87 year old with abdominal distension and right lower quadrant pain EXAM: CT ABDOMEN AND PELVIS WITHOUT CONTRAST TECHNIQUE: Multidetector CT imaging of the abdomen and pelvis was performed following the standard protocol without IV contrast. COMPARISON:  CT 03/10/2020 FINDINGS: Lower chest: Subpleural reticular opacity and ground-glass in the right greater than left lower lobe. Resolution of previous pleural effusions. Hepatobiliary: No focal hepatic abnormality on this unenhanced exam. Unremarkable gallbladder. No calcified gallstone. No biliary dilatation. Pancreas: Age related fatty atrophy No ductal dilatation or inflammation. Spleen: Normal in size without focal abnormality. Adrenals/Urinary Tract: Normal adrenal glands. No hydronephrosis. Small upper pole right renal calculus. Small left renal cyst. Unremarkable urinary bladder. Stomach/Bowel: Decompressed stomach. Fluid-filled duodenal diverticulum without inflammation. There is no inflamed small bowel diverticula in the right abdomen, series 5, image 32 hand series 2, image 56. Adjacent fluid-filled small bowel and mesenteric edema. Small amount of mottled gas in the adjacent mesentery, series 2, image 54. This does not appear to be within the mesenteric vasculature. There is no small bowel pneumatosis. Regional small bowel are mildly prominent fluid-filled suggesting regional ileus. Fluid within the cecum and  ascending colon. There is formed stool in the more distal colon. Descending and sigmoid colonic diverticulosis without colonic diverticulitis. Vascular/Lymphatic: Aortic atherosclerosis. No aortic aneurysm. There is no portal venous gas. No abdominopelvic adenopathy. Reproductive: Brachytherapy seeds in the prostate. Other: Inflammatory changes with stranding and mesenteric edema in the right small bowel mesentery. Small amount of mottled extraluminal gas related small bowel diverticulitis. No abdominopelvic ascites. No focal or drainable collection. Fat containing bilateral inguinal hernias. Musculoskeletal: Thoracolumbar spine degenerative change. There are no acute or suspicious osseous abnormalities. IMPRESSION: 1. Recurrent acute small bowel diverticulitis in the right abdomen. Small amount of mottled extraluminal gas in the adjacent mesentery consistent with microperforation. No focal or drainable collection. 2. Regional small bowel ileus. 3. Colonic diverticulosis without diverticulitis. 4. Nonobstructing right nephrolithiasis. 5. Subpleural reticular opacity and ground-glass in the right greater than left lower lobe, favoring post infectious/inflammatory sequela. Aortic Atherosclerosis (ICD10-I70.0). Critical Value/emergent results were called by telephone at the time of interpretation on 06/21/2021 at 8:28 pm to provider [facility] , who verbally acknowledged these results. Electronically Signed   By: Keith Rake M.D.   On: 06/21/2021 20:29    Scheduled Meds:  acidophilus  2 capsule Oral  TID   amLODipine  5 mg Oral Daily   cholecalciferol  1,000 Units Oral Daily   enoxaparin (LOVENOX) injection  40 mg Subcutaneous Q24H   escitalopram  10 mg Oral Daily   famotidine  10 mg Oral BID   ferrous sulfate  325 mg Oral Q breakfast   traZODone  150 mg Oral QHS   Continuous Infusions:  sodium chloride 100 mL/hr at 06/22/21 1017   ceFEPime (MAXIPIME) IV     metronidazole 500 mg (06/22/21 1412)      LOS: 1 day    Time spent: 35 mins    Jarick Harkins, MD [facility]   If 7PM-7AM, please contact night-coverage

## 2021-06-22 NOTE — Op Note (Signed)
Preoperative diagnosis: Diverticulitis of small intestine with perforation  Postoperative diagnosis: Same  Procedure: Exploratory laparotomy with small bowel resection.   Anesthesia: GETA  Surgeon: Dr. Windell Moment, MD  Wound Classification: Clean Contaminated  Indications: Patient is a 87 y.o. male with signs and symptoms of abdominal pain with guarding and rebound tenderness in right lower quadrant.  CT scan showing procedure perforation with gas on the mesentery.   Findings: 1.  Small portion of jejunum with posterior perforation with mesenteric abscess.   2.  Pandiverticulosis of the small intestine 3.  Adequate hemostasis 4.  Side-to-side small bowel anastomosis with adequate perfusion and tension-free.  Description of procedure: The patient was placed in the supine position and general endotracheal anesthesia was induced. A timeout was completed verifying correct patient, procedure, site, positioning, and implant(s) and/or special equipment prior to beginning this procedure. Preoperative antibiotics were given. A Foley catheter and nasogastric tube were placed. The abdomen was prepped and draped in the usual sterile fashion. After a timeout was performed, a vertical midline incision was made from xiphoid to just below the umbilicus. This was deepened through the subcutaneous tissues and hemostasis was achieved with electrocautery. The linea alba was identified and incised and the peritoneal cavity entered. The abdomen was explored and small amount of purulent peritonitis was identified in the right lower quadrant. Adhesions were lysed sharply under direct vision with Metzenbaum scissors.   The small bowel was inspected from ligament of Treitz to uterosacral vault.  There was a 50 cm segment of jejunum that was identified with thickening with a procedure abscess in the mesentery.  A window was created by using a curved hemostat to separate the mesentery from the bowel at each resection  margin. The mesentery was scored and divided with Enseal.  The bowel was divided with a cutting linear stapler at each resection margin and passed off the table as a specimen. The antimesenteric angles of the proximal and distal seg- ments were then approximated with two sutures of 3-0 silk placed approximately 5 cm apart. Enterotomies were made at the antimesenteric borders and the cutting linear stapler inserted and fired. The lumen was inspected for hemostasis. The enterotomies were closed with a single firing of a linear stapler.  The anastomosis was then inspected for patency and integrity. The mesenteric defect was closed with a running 3-0 Vicryl suture. The remaining small bowel appeared viable.   After the sponge needle and instrument count was correct, the fascia was closed with a running suture of  PDS 0. The skin was closed with skin staples. The patient tolerated the procedure well and was taken to the postanesthesia care unit in stable condition.   Specimen: Small bowel  Complications: None  Estimated Blood Loss: 50 mL

## 2021-06-22 NOTE — Consult Note (Signed)
SURGICAL CONSULTATION NOTE   HISTORY OF PRESENT ILLNESS (HPI):  87 y.o. male presented to [facility] ED for evaluation of abdominal pain she states ago. Patient reports patient presented with right lower quadrant pain.  Pain started 2 days ago.  Pain aggravated by applying pressure and certain abdominal wall movement.  Patient endorses that the pain has been worsening.  He denies any nausea or vomiting.  He denies any fever or chills.  He denies any bloody diarrhea at this time.  There has been no alleviating factors.  At the ED he was found with leukocytosis.  CT scan of the abdomen and pelvis shows perforation of the small bowel diverticulitis with segment of small bowel with thickening.  I personally evaluated the images.  I respectfully disagree with radiologist that this is not just microperforation.  There is significant amount of air in the mesentery of the segment of the small bowel.  As per discussion with patient this is at least the third time that he has diverticulitis of small intestine.  So far he has been the worst episode.  He also had an episode of GI bleeding suspected that it was coming from small intestine.  Surgery is consulted by Dr. Sidney Ace in this context for evaluation and management of perforated small bowel diverticulitis.  PAST MEDICAL HISTORY (PMH):  Past Medical History:  Diagnosis Date   COPD (chronic obstructive pulmonary disease) ([place])    Hypertension    Pneumonia    2012,1985 hospitalized   Prostate cancer ([place])    with seed placement   Sleep apnea    noncompliant, dx 1996   Stroke ([place]) 2012   no residuals     PAST SURGICAL HISTORY ([place]):  Past Surgical History:  Procedure Laterality Date   adenoid     APPENDECTOMY     COLONOSCOPY WITH PROPOFOL N/A 01/28/2021   Procedure: COLONOSCOPY WITH PROPOFOL;  Surgeon: Lucilla Lame, MD;  Location: [facility] ENDOSCOPY;  Service: Endoscopy;  Laterality: N/A;  GI bleed   ENTEROSCOPY N/A 01/30/2021   Procedure: ENTEROSCOPY;   Surgeon: Lin Landsman, MD;  Location: [facility] ENDOSCOPY;  Service: Gastroenterology;  Laterality: N/A;   ESOPHAGOGASTRODUODENOSCOPY (EGD) WITH PROPOFOL N/A 01/27/2021   Procedure: ESOPHAGOGASTRODUODENOSCOPY (EGD) WITH PROPOFOL;  Surgeon: Lucilla Lame, MD;  Location: [facility] ENDOSCOPY;  Service: Endoscopy;  Laterality: N/A;   GIVENS CAPSULE STUDY N/A 01/28/2021   Procedure: GIVENS CAPSULE STUDY;  Surgeon: Lin Landsman, MD;  Location: [facility] ENDOSCOPY;  Service: Gastroenterology;  Laterality: N/A;   HEMORRHOID SURGERY     spine cyst     TONSILLECTOMY       MEDICATIONS:  Prior to Admission medications   Medication Sig Start Date End Date Taking? Authorizing Provider  acetaminophen (TYLENOL) 325 MG tablet Take 2 tablets (650 mg total) by mouth every 6 (six) hours as needed for mild pain (or Fever >/= 101). 03/13/20  Yes Wieting, Richard, MD  albuterol (ACCUNEB) 0.63 MG/3ML nebulizer solution 3 (three) times daily as needed. 03/31/20  Yes [provider]  albuterol (VENTOLIN HFA) 108 (90 Base) MCG/ACT inhaler Inhale 1-2 puffs into the lungs every 6 (six) hours as needed for wheezing or shortness of breath.   Yes [provider]  amLODipine (NORVASC) 5 MG tablet Take 5 mg by mouth daily.   Yes [provider]  budesonide (PULMICORT) 0.5 MG/2ML nebulizer solution Take 0.5 mg by nebulization daily.   Yes [provider]  Cholecalciferol (VITAMIN D-3) 25 MCG (1000 UT) CAPS Take 1,000 Units by  mouth daily.    Yes [provider]  escitalopram (LEXAPRO) 10 MG tablet Take 10 mg by mouth daily.    Yes [provider]  famotidine (PEPCID) 20 MG tablet Take 20 mg by mouth 2 (two) times daily as needed. 11/24/20  Yes [provider]  fluticasone (FLONASE) 50 MCG/ACT nasal spray Place 2 sprays into both nostrils daily as needed for allergies or rhinitis.   Yes [provider]  loratadine (CLARITIN) 10 MG tablet Take 10 mg by mouth daily.    Yes [provider]  losartan-hydrochlorothiazide (HYZAAR) 100-25 MG tablet Take 0.5 tablets by mouth daily.   Yes [provider]  melatonin 5 MG TABS Take 5 mg by mouth at bedtime.   Yes [provider]  omeprazole (PRILOSEC) 20 MG capsule Take 20 mg by mouth daily.   Yes [provider]  ondansetron (ZOFRAN) 4 MG tablet Take 4 mg by mouth every 8 (eight) hours as needed for nausea or vomiting.   Yes [provider]  potassium chloride (KLOR-CON) 10 MEQ tablet Take 10 mEq by mouth daily as needed (when torsemide is given).   Yes [provider]  simethicone (MYLICON) 932 MG chewable tablet Chew 125 mg by mouth every 6 (six) hours as needed for flatulence.   Yes [provider]  torsemide (DEMADEX) 10 MG tablet Take 10 mg by mouth daily as needed.   Yes [provider]  traZODone (DESYREL) 150 MG tablet Take 150 mg by mouth at bedtime.   Yes [provider]  triamcinolone cream (KENALOG) 0.1 % Apply 1 application topically 2 (two) times daily.   Yes [provider]  acidophilus (RISAQUAD) CAPS capsule Take 2 capsules by mouth 3 (three) times daily. Patient not taking: Reported on 06/21/2021 02/01/21   Geradine Girt, DO  Apoaequorin (PREVAGEN) 10 MG CAPS Take 10 mg by mouth daily. Patient not taking: Reported on 06/21/2021    [provider]  azithromycin (ZITHROMAX) 250 MG tablet Take 250 mg by mouth 3 (three) times a week. Patient not taking: Reported on 06/21/2021 01/18/21   [provider]  Ferrous Sulfate (IRON PO) Take by mouth daily. Patient not taking: Reported on 06/21/2021    [provider]     ALLERGIES:  Allergies  Allergen Reactions   Penicillins Hives, Rash and Other (See Comments)    Rash on hands and blisters on ankles when 87 years old Patient tolerates Zosyn   Bee Venom Palpitations    INSECT BITES/STINGS   Darvon [Propoxyphene] Nausea Only and Rash      SOCIAL HISTORY:  Social History   Socioeconomic History   Marital status: Married    Spouse name: Not on file   Number of children: Not on file   Years of education: Not on file   Highest education level: Not on file  Occupational History   Not on file  Tobacco Use   Smoking status: Former    Years: 40.00    Types: Cigarettes    Quit date: 08/19/1985    Years since quitting: 35.8   Smokeless tobacco: Never  Vaping Use   Vaping Use: Never used  Substance and Sexual Activity   Alcohol use: Yes    Alcohol/week: 14.0 standard drinks    Types: 14 Shots of liquor per week   Drug use: No   Sexual activity: Not on file  Other Topics Concern   Not on file  Social History Narrative   Live  with your wife at [facility], walks independently, no current oxygen   Social Determinants of Health   Financial Resource Strain: Not on file  Food Insecurity: Not on file  Transportation Needs: Not on file  Physical Activity: Not on file  Stress: Not on file  Social Connections: Not on file  Intimate Partner Violence: Not on file      FAMILY HISTORY:  Family History  Problem Relation Age of Onset   Congestive Heart Failure Mother    COPD Sister    Asthma Sister    Tuberculosis Maternal Grandmother    Stroke Maternal Grandfather      REVIEW OF SYSTEMS:  Constitutional: denies weight loss, fever, chills, or sweats  Eyes: denies any other vision changes, history of eye injury  ENT: denies sore throat, hearing problems  Respiratory: denies shortness of breath, wheezing  Cardiovascular: denies chest pain, palpitations  Gastrointestinal: Positive abdominal pain Genitourinary: denies burning with urination or urinary frequency Musculoskeletal: denies any other joint pains or cramps  Skin: denies any other rashes or skin discolorations  Neurological: denies any other headache, dizziness, weakness  Psychiatric: denies any other depression, anxiety   All other  review of systems were negative   VITAL SIGNS:  Temp:  [98.6 F (37 C)-98.9 F (37.2 C)] 98.6 F (37 C) (11/04 0400) Pulse Rate:  [59-79] 76 (11/04 1200) Resp:  [16-20] 16 (11/04 1200) BP: (87-131)/(40-62) 129/59 (11/04 1200) SpO2:  [89 %-97 %] 94 % (11/04 1200) Weight:  [94.3 kg] 94.3 kg (11/04 0406)     Height: 5\' 8"  (172.7 cm) Weight: 94.3 kg BMI (Calculated): 31.62   INTAKE/OUTPUT:  This shift: Total I/O In: -  Out: 500 [Urine:500]  Last 2 shifts: @IOLAST2SHIFTS @   PHYSICAL EXAM:  Constitutional:  -- Normal body habitus  -- Awake, alert, and oriented x3  Eyes:  -- Pupils equally round and reactive to light  -- No scleral icterus  Ear, nose, and throat:  -- No jugular venous distension  Pulmonary:  -- No crackles  -- Equal breath sounds bilaterally -- Breathing non-labored at rest Cardiovascular:  -- S1, S2 present  -- No pericardial rubs Gastrointestinal:  -- Abdomen soft, tender to palpation in right lower quadrant, non distended, with guarding and rebound tenderness -- No abdominal masses appreciated, pulsatile or otherwise  Musculoskeletal and Integumentary:  -- Wounds: None appreciated -- Extremities: B/L UE and LE FROM, hands and feet warm, no edema  Neurologic:  -- Motor function: intact and symmetric -- Sensation: intact and symmetric   Labs:  CBC Latest Ref Rng & Units 06/22/2021 06/21/2021 02/01/2021  WBC 4.0 - 10.5 K/uL 12.2(H) 15.7(H) -  Hemoglobin 13.0 - 17.0 g/dL 9.2(L) 10.5(L) 9.3(L)  Hematocrit 39.0 - 52.0 % 27.3(L) 31.0(L) 24.9(L)  Platelets 150 - 400 K/uL 195 233 -   CMP Latest Ref Rng & Units 06/22/2021 06/21/2021 01/30/2021  Glucose 70 - 99 mg/dL 101(H) 114(H) 128(H)  BUN 8 - 23 mg/dL 54(H) 56(H) 35(H)  Creatinine 0.61 - 1.24 mg/dL 2.35(H) 2.65(H) 1.83(H)  Sodium 135 - 145 mmol/L 134(L) 131(L) 135  Potassium 3.5 - 5.1 mmol/L 4.2 4.3 3.7  Chloride 98 - 111 mmol/L 103 96(L) 101  CO2 22 - 32 mmol/L 24 25 26   Calcium 8.9 - 10.3 mg/dL 8.2(L)  8.7(L) 8.2(L)  Total Protein 6.5 - 8.1 g/dL - 7.0 -  Total Bilirubin 0.3 - 1.2 mg/dL - 0.9 -  Alkaline Phos 38 - 126 U/L - 55 -  AST  15 - 41 U/L - 13(L) -  ALT 0 - 44 U/L - 9 -    Imaging studies:  EXAM: CT ABDOMEN AND PELVIS WITHOUT CONTRAST   TECHNIQUE: Multidetector CT imaging of the abdomen and pelvis was performed following the standard protocol without IV contrast.   COMPARISON:  CT 03/10/2020   FINDINGS: Lower chest: Subpleural reticular opacity and ground-glass in the right greater than left lower lobe. Resolution of previous pleural effusions.   Hepatobiliary: No focal hepatic abnormality on this unenhanced exam. Unremarkable gallbladder. No calcified gallstone. No biliary dilatation.   Pancreas: Age related fatty atrophy No ductal dilatation or inflammation.   Spleen: Normal in size without focal abnormality.   Adrenals/Urinary Tract: Normal adrenal glands. No hydronephrosis. Small upper pole right renal calculus. Small left renal cyst. Unremarkable urinary bladder.   Stomach/Bowel: Decompressed stomach. Fluid-filled duodenal diverticulum without inflammation. There is no inflamed small bowel diverticula in the right abdomen, series 5, image 32 hand series 2, image 56. Adjacent fluid-filled small bowel and mesenteric edema. Small amount of mottled gas in the adjacent mesentery, series 2, image 54. This does not appear to be within the mesenteric vasculature. There is no small bowel pneumatosis. Regional small bowel are mildly prominent fluid-filled suggesting regional ileus. Fluid within the cecum and ascending colon. There is formed stool in the more distal colon. Descending and sigmoid colonic diverticulosis without colonic diverticulitis.   Vascular/Lymphatic: Aortic atherosclerosis. No aortic aneurysm. There is no portal venous gas. No abdominopelvic adenopathy.   Reproductive: Brachytherapy seeds in the prostate.   Other: Inflammatory changes with  stranding and mesenteric edema in the right small bowel mesentery. Small amount of mottled extraluminal gas related small bowel diverticulitis. No abdominopelvic ascites. No focal or drainable collection. Fat containing bilateral inguinal hernias.   Musculoskeletal: Thoracolumbar spine degenerative change. There are no acute or suspicious osseous abnormalities.   IMPRESSION: 1. Recurrent acute small bowel diverticulitis in the right abdomen. Small amount of mottled extraluminal gas in the adjacent mesentery consistent with microperforation. No focal or drainable collection. 2. Regional small bowel ileus. 3. Colonic diverticulosis without diverticulitis. 4. Nonobstructing right nephrolithiasis. 5. Subpleural reticular opacity and ground-glass in the right greater than left lower lobe, favoring post infectious/inflammatory sequela.   Aortic Atherosclerosis (ICD10-I70.0).   Critical Value/emergent results were called by telephone at the time of interpretation on 06/21/2021 at 8:28 pm to provider [facility] , who verbally acknowledged these results.     Electronically Signed   By: Keith Rake M.D.   On: 06/21/2021 20:29  Assessment/Plan:  87 y.o. male with diverticulitis of small intestine with perforation, complicated by pertinent comorbidities including COPD, hypertension.  I discussed with patient CT scan results of perforation of the small intestine due to diverticulitis.  Upon discussion with patient this is at least the third episode with significant amount of air in the mesentery.  Patient initially expressed she desires to avoid surgical management.  I discussed with patient the scenarios of observation versus surgical management.  Trial of antibiotic therapy can be successful but if not he could have more difficult surgery.  Also high risk of recurrence.  Surgical management also with wrist due to patient 87 year old, COPD.  I had a long discussion with his wife and his  daughter about this alternatives.  After long discussion with family patient agreed to proceed with surgery.  We will schedule patient in the OR.  Arnold Long, MD

## 2021-06-22 NOTE — Anesthesia Procedure Notes (Signed)
Procedure Name: Intubation Date/Time: 06/22/2021 5:04 PM Performed by: Tollie Eth, CRNA Pre-anesthesia Checklist: Patient identified, Patient being monitored, Timeout performed, Emergency Drugs available and Suction available Patient Re-evaluated:Patient Re-evaluated prior to induction Oxygen Delivery Method: Circle system utilized Preoxygenation: Pre-oxygenation with 100% oxygen Induction Type: IV induction and Rapid sequence Laryngoscope Size: 4 and McGraph Grade View: Grade I Tube type: Oral Tube size: 7.5 mm Number of attempts: 1 Airway Equipment and Method: Stylet and Video-laryngoscopy Placement Confirmation: ETT inserted through vocal cords under direct vision, positive ETCO2 and breath sounds checked- equal and bilateral Secured at: 21 cm Tube secured with: Tape Dental Injury: Teeth and Oropharynx as per pre-operative assessment

## 2021-06-22 NOTE — ED Notes (Addendum)
Patient to OR. Report called to [place], Therapist, sports.

## 2021-06-22 NOTE — Transfer of Care (Signed)
Immediate Anesthesia Transfer of Care Note  Patient: Trevor Ferguson  Procedure(s) Performed: EXPLORATORY LAPAROTOMY SMALL BOWEL RESECTION  Patient Location: PACU  Anesthesia Type:General  Level of Consciousness: drowsy  Airway & Oxygen Therapy: Patient Spontanous Breathing and Patient connected to face mask oxygen  Post-op Assessment: Report given to RN and Post -op Vital signs reviewed and stable  Post vital signs: Reviewed and stable  Last Vitals:  Vitals Value Taken Time  BP 107/51 06/22/21 1845  Temp    Pulse 86 06/22/21 1846  Resp 18 06/22/21 1846  SpO2 96 % 06/22/21 1846  Vitals shown include unvalidated device data.  Last Pain:  Vitals:   06/22/21 0400  TempSrc: Oral  PainSc:          Complications: No notable events documented.

## 2021-06-22 NOTE — Anesthesia Preprocedure Evaluation (Signed)
Anesthesia Evaluation  Patient identified by MRN, date of birth, ID band Patient awake    Reviewed: Allergy & Precautions, NPO status , Patient's Chart, lab work & pertinent test results  History of Anesthesia Complications Negative for: history of anesthetic complications  Airway Mallampati: III  TM Distance: >3 FB Neck ROM: Full    Dental  (+) Poor Dentition, Dental Advidsory Given   Pulmonary shortness of breath and with exertion, sleep apnea , COPD,  COPD inhaler, neg recent URI, Patient abstained from smoking.Not current smoker, former smoker,  Breathing feels at baseline today    + decreased breath sounds      Cardiovascular Exercise Tolerance: Good METShypertension, (-) angina+CHF  (-) CAD and (-) Past MI (-) dysrhythmias  Rhythm:Regular Rate:Normal - Systolic murmurs 0626 TTE: 1. Left ventricular ejection fraction, by estimation, is 60 to 65%. The  left ventricle has normal function. The left ventricle has no regional  wall motion abnormalities. Left ventricular diastolic parameters were  normal.  2. Right ventricular systolic function is normal. The right ventricular  size is normal. There is severely elevated pulmonary artery systolic  pressure.  3. The mitral valve is normal in structure. Trivial mitral valve  regurgitation. No evidence of mitral stenosis.  4. The aortic valve is normal in structure. Aortic valve regurgitation is  not visualized. No aortic stenosis is present.  5. The inferior vena cava is normal in size with greater than 50%  respiratory variability, suggesting right atrial pressure of 3 mmHg.   Neuro/Psych neg Seizures PSYCHIATRIC DISORDERS Depression CVA, No Residual Symptoms    GI/Hepatic neg GERD  ,(+)     (-) substance abuse  ,   Endo/Other  neg diabetes  Renal/GU CRFRenal disease     Musculoskeletal   Abdominal   Peds  Hematology  (+) Blood dyscrasia, anemia ,    Anesthesia Other Findings Past Medical History: No date: COPD (chronic obstructive pulmonary disease) ([facility]) No date: Hypertension No date: Pneumonia     Comment:  2012,1985 hospitalized No date: Prostate cancer ([place])     Comment:  with seed placement No date: Sleep apnea     Comment:  noncompliant, dx 1996 2012: Stroke [facility])     Comment:  no residuals  Reproductive/Obstetrics                                                              Anesthesia Evaluation  Patient identified by MRN, date of birth, ID band Patient awake    Reviewed: Allergy & Precautions, NPO status , Patient's Chart, lab work & pertinent test results  History of Anesthesia Complications Negative for: history of anesthetic complications  Airway Mallampati: II  TM Distance: >3 FB Neck ROM: Full    Dental no notable dental hx. (+) Teeth Intact   Pulmonary neg pulmonary ROS, sleep apnea and Continuous Positive Airway Pressure Ventilation , pneumonia, resolved, COPD (Uses nebs daily),  COPD inhaler, former smoker,    Pulmonary exam normal breath sounds clear to auscultation       Cardiovascular Exercise Tolerance: Poor hypertension, Pt. on medications +CHF  negative cardio ROS Normal cardiovascular exam Rhythm:Regular Rate:Normal     Neuro/Psych PSYCHIATRIC DISORDERS Depression CVA negative neurological ROS  negative psych ROS   GI/Hepatic negative GI ROS, Neg liver ROS,  Endo/Other  negative endocrine ROS  Renal/GU Renal InsufficiencyRenal diseasenegative Renal ROS  negative genitourinary   Musculoskeletal negative musculoskeletal ROS (+)   Abdominal   Peds negative pediatric ROS (+)  Hematology negative hematology ROS (+) anemia ,   Anesthesia Other Findings   Reproductive/Obstetrics negative OB ROS                             Anesthesia Physical Anesthesia Plan  ASA: 3  Anesthesia Plan: MAC   Post-op Pain  Management:    Induction: Intravenous  PONV Risk Score and Plan:   Airway Management Planned: Natural Airway and Nasal Cannula  Additional Equipment:   Intra-op Plan:   Post-operative Plan:   Informed Consent: I have reviewed the patients History and Physical, chart, labs and discussed the procedure including the risks, benefits and alternatives for the proposed anesthesia with the patient or authorized representative who has indicated his/her understanding and acceptance.     Dental Advisory Given  Plan Discussed with: Anesthesiologist, CRNA and Surgeon  Anesthesia Plan Comments: (Patient consented for risks of anesthesia including but not limited to:  - adverse reactions to medications - damage to eyes, teeth, lips or other oral mucosa - nerve damage due to positioning  - sore throat or hoarseness - Damage to heart, brain, nerves, lungs, other parts of body or loss of life  Patient voiced understanding.)        Anesthesia Quick Evaluation  Anesthesia Physical  Anesthesia Plan  ASA: 3  Anesthesia Plan: General   Post-op Pain Management:    Induction: Intravenous, Rapid sequence and Cricoid pressure planned  PONV Risk Score and Plan: 2 and Ondansetron, Dexamethasone and Treatment may vary due to age or medical condition  Airway Management Planned: Oral ETT  Additional Equipment: None  Intra-op Plan:   Post-operative Plan: Extubation in OR  Informed Consent: I have reviewed the patients History and Physical, chart, labs and discussed the procedure including the risks, benefits and alternatives for the proposed anesthesia with the patient or authorized representative who has indicated his/her understanding and acceptance.   Patient has DNR.  Discussed DNR with patient and Suspend DNR.   Dental advisory given  Plan Discussed with: CRNA and Surgeon  Anesthesia Plan Comments: (Discussed risks of anesthesia with patient, including possibility of  difficulty with spontaneous ventilation under anesthesia necessitating airway intervention, PONV, and rare risks such as cardiac or respiratory or neurological events. Discussed DNR status; patient wishes to suspend it perioperatively. Patient understands.)        Anesthesia Quick Evaluation

## 2021-06-23 ENCOUNTER — Inpatient Hospital Stay: Payer: Medicare PPO

## 2021-06-23 DIAGNOSIS — K5792 Diverticulitis of intestine, part unspecified, without perforation or abscess without bleeding: Secondary | ICD-10-CM | POA: Diagnosis not present

## 2021-06-23 LAB — MAGNESIUM: Magnesium: 2.2 mg/dL (ref 1.7–2.4)

## 2021-06-23 LAB — CBC
HCT: 28.4 % — ABNORMAL LOW (ref 39.0–52.0)
Hemoglobin: 9.5 g/dL — ABNORMAL LOW (ref 13.0–17.0)
MCH: 31.1 pg (ref 26.0–34.0)
MCHC: 33.5 g/dL (ref 30.0–36.0)
MCV: 93.1 fL (ref 80.0–100.0)
Platelets: 188 10*3/uL (ref 150–400)
RBC: 3.05 MIL/uL — ABNORMAL LOW (ref 4.22–5.81)
RDW: 14.8 % (ref 11.5–15.5)
WBC: 14.9 10*3/uL — ABNORMAL HIGH (ref 4.0–10.5)
nRBC: 0 % (ref 0.0–0.2)

## 2021-06-23 LAB — PHOSPHORUS: Phosphorus: 5.4 mg/dL — ABNORMAL HIGH (ref 2.5–4.6)

## 2021-06-23 LAB — BASIC METABOLIC PANEL
Anion gap: 8 (ref 5–15)
BUN: 53 mg/dL — ABNORMAL HIGH (ref 8–23)
CO2: 21 mmol/L — ABNORMAL LOW (ref 22–32)
Calcium: 8 mg/dL — ABNORMAL LOW (ref 8.9–10.3)
Chloride: 105 mmol/L (ref 98–111)
Creatinine, Ser: 2.51 mg/dL — ABNORMAL HIGH (ref 0.61–1.24)
GFR, Estimated: 23 mL/min — ABNORMAL LOW (ref >60)
Glucose, Bld: 160 mg/dL — ABNORMAL HIGH (ref 70–99)
Potassium: 4.7 mmol/L (ref 3.5–5.1)
Sodium: 134 mmol/L — ABNORMAL LOW (ref 135–145)

## 2021-06-23 MED ORDER — LACTULOSE 10 GM/15ML PO SOLN
30.0000 g | Freq: Every day | ORAL | Status: DC | PRN
  Administered 2021-06-23: 16:00:00 30 g via ORAL
  Filled 2021-06-23: qty 60

## 2021-06-23 MED ORDER — SIMETHICONE 80 MG PO CHEW
80.0000 mg | CHEWABLE_TABLET | Freq: Four times a day (QID) | ORAL | Status: DC | PRN
  Administered 2021-06-23 – 2021-06-24 (×2): 80 mg via ORAL
  Filled 2021-06-23 (×3): qty 1

## 2021-06-23 MED ORDER — OXYCODONE-ACETAMINOPHEN 5-325 MG PO TABS
1.0000 | ORAL_TABLET | Freq: Four times a day (QID) | ORAL | Status: DC | PRN
  Administered 2021-06-23 – 2021-06-30 (×6): 1 via ORAL
  Filled 2021-06-23 (×6): qty 1

## 2021-06-23 MED ORDER — ALBUTEROL SULFATE (2.5 MG/3ML) 0.083% IN NEBU
2.5000 mg | INHALATION_SOLUTION | RESPIRATORY_TRACT | Status: DC | PRN
  Administered 2021-06-30 – 2021-07-05 (×2): 2.5 mg via RESPIRATORY_TRACT
  Filled 2021-06-23 (×3): qty 3

## 2021-06-23 MED ORDER — MENTHOL 3 MG MT LOZG
1.0000 | LOZENGE | OROMUCOSAL | Status: DC | PRN
  Filled 2021-06-23: qty 9

## 2021-06-23 MED ORDER — GUAIFENESIN-DM 100-10 MG/5ML PO SYRP
5.0000 mL | ORAL_SOLUTION | ORAL | Status: DC | PRN
  Administered 2021-06-23 – 2021-07-06 (×6): 5 mL via ORAL
  Filled 2021-06-23 (×6): qty 5

## 2021-06-23 MED ORDER — BUDESONIDE 0.5 MG/2ML IN SUSP
0.5000 mg | Freq: Every day | RESPIRATORY_TRACT | Status: DC
  Administered 2021-06-23 – 2021-07-08 (×16): 0.5 mg via RESPIRATORY_TRACT
  Filled 2021-06-23 (×17): qty 2

## 2021-06-23 MED ORDER — ORAL CARE MOUTH RINSE
15.0000 mL | Freq: Two times a day (BID) | OROMUCOSAL | Status: DC
  Administered 2021-06-23 – 2021-07-18 (×45): 15 mL via OROMUCOSAL

## 2021-06-23 NOTE — Progress Notes (Signed)
PT Cancellation Note  Patient Details Name: Trevor Ferguson MRN: [id number] DOB: 1926/05/30   Cancelled Treatment:    Reason Eval/Treat Not Completed: Patient declined, no reason specified. Order received, pt chart reviewed. Pt sitting up in recliner upon arrival. He respectfully declines therapy evaluation stating he is in a lot of gas/bloating pain this afternoon. PT will re-attempt at later date.    Patrina Levering PT, DPT 06/23/21 2:57 PM [phone]

## 2021-06-23 NOTE — Progress Notes (Signed)
Patient woke up confused thinking he was at facility. Pulled out NG tube. Had 50 ml output. No complains of pain or nausea. Has been resting well. Dr. Peyton Najjar aware, no new orders.

## 2021-06-23 NOTE — TOC Initial Note (Addendum)
Transition of Care [facility]) - Initial/Assessment Note    Patient Details  Name: Trevor Ferguson MRN: [id number] Date of Birth: [birth year]  Transition of Care [facility]) CM/SW Contact:    Magnus Ivan, LCSW Phone Number: 06/23/2021, 12:04 PM  Clinical Narrative:                CSW met with patient at bedside to complete assessment. Patient stated he lives at [facility], has been in their SNF for 5-6 weeks. Sent message to Seth Bake to confirm if short term rehab or long term care. PCP is Dr. Silvio Pate. Patient uses a RW and o2. Uses [facility] transportation services. Stated he plans to return to [facility] at [place].  1:30- Per Seth Bake at [facility] patient is a long term care resident there and can return at [place]. PT eval pending to determine if needs STR. Will complete FL2 after PT sees the patient.  Expected Discharge Plan: Long Term Nursing Home Barriers to Discharge: Continued Medical Work up   Patient Goals and CMS Choice Patient states their goals for this hospitalization and ongoing recovery are:: return to [facility] CMS Medicare.gov Compare Post Acute Care list provided to:: Patient Choice offered to / list presented to : Patient  Expected Discharge Plan and Services Expected Discharge Plan: [place]       Living arrangements for the past 2 months: [place]                                      Prior Living Arrangements/Services Living arrangements for the past 2 months: [place] Lives with:: Facility Resident, Spouse Patient language and need for interpreter reviewed:: Yes Do you feel safe going back to the place where you live?: Yes      Need for Family Participation in Patient Care: Yes (Comment) Care giver support system in place?: Yes (comment) Current home services: DME Criminal Activity/Legal Involvement Pertinent to Current Situation/Hospitalization: No - Comment as needed  Activities of Daily Living Home  Assistive Devices/Equipment: Carne (specify type), Eyeglasses, Grab bars in shower (rolling with chair) ADL Screening (condition at time of admission) Patient's cognitive ability adequate to safely complete daily activities?: Yes Is the patient deaf or have difficulty hearing?: Yes (hearing aids, not with him) Does the patient have difficulty seeing, even when wearing glasses/contacts?: Yes Does the patient have difficulty concentrating, remembering, or making decisions?: Yes Patient able to express need for assistance with ADLs?: Yes Does the patient have difficulty dressing or bathing?: Yes Independently performs ADLs?: Yes (appropriate for developmental age) Does the patient have difficulty walking or climbing stairs?: Yes Weakness of Legs: Both Weakness of Arms/Hands: Both  Permission Sought/Granted Permission sought to share information with : Chartered certified accountant granted to share information with : Yes, Verbal Permission Granted     Permission granted to share info w AGENCY: [facility]        Emotional Assessment       Orientation: : Oriented to Self, Oriented to Place, Oriented to  Time, Oriented to Situation Alcohol / Substance Use: Not Applicable Psych Involvement: No (comment)  Admission diagnosis:  Diverticulitis [K57.92] AKI (acute kidney injury) ([place]) [N17.9] Acute diverticulitis [K57.92] Perforation of intestine due to diverticulitis of gastrointestinal tract [K57.80] Patient Active Problem List   Diagnosis Date Noted   Acute GI bleeding    Polyp of sigmoid  colon    Rectal bleeding    GI bleed 01/26/2021   Benign hypertensive kidney disease with chronic kidney disease 01/08/2021   Hemorrhoids    Rash    Depression    Weakness    Abdominal pain 03/06/2020   Acute kidney injury superimposed on CKD ([place]) 03/06/2020   Anemia 03/06/2020   Acute diverticulitis 03/01/2020   Pneumonia 07/17/2018   Leg swelling 12/18/2015   Chronic  diastolic CHF (congestive heart failure) ([place]) 07/18/2015   Essential hypertension 07/18/2015   Prostate cancer ([place]) 06/02/2015   COPD (chronic obstructive pulmonary disease) ([place]) 03/28/2015   Sleep apnea 03/28/2015   SOB (shortness of breath) 03/28/2015   Personal history of transient ischemic attack (TIA), and cerebral infarction without residual deficits 09/21/2014   PCP:  [name], MD Pharmacy:   [facility] [place], [place] - [place] [facility] [place] [address] [place] [address] Phone: [phone] Fax: [phone]     Social Determinants of Health (SDOH) Interventions    Readmission Risk Interventions Readmission Risk Prevention Plan 06/23/2021 01/29/2021  Transportation Screening Complete Complete  PCP or Specialist Appt within 3-5 Days Complete Complete  HRI or Home Care Consult Complete Complete  Social Work Consult for [facility] Planning/Counseling Complete Complete  Palliative Care Screening Not Applicable Not Applicable  Medication Review Press photographer) Complete Complete  Some recent data might be hidden

## 2021-06-23 NOTE — Progress Notes (Addendum)
PROGRESS NOTE  Trevor Ferguson:[id number] DOB: 09-26-1925 DOA: 06/21/2021 PCP: [name], MD  HPI/Recap of past 24 hours:  *Brief Narrative:   This is a 87 year old male with past medical history significant for COPD hypertension, hyperlipidemia, sleep apnea, CKD stage IIIb, CVA, who presented to the emergency room with acute onset of right lower quadrant abdominal pain which has started a couple of weeks prior.  CT scan showed recurrent acute small bowel diverticulitis with small amount of mottled extra luminal gas in the mesentery consistent with microperforation there was no focal or drainable collection they also found regional small bowel ileus.  Surgery was therefore consulted and patient had exploratory laparotomy on June 22, 2021  Subjective: June 23, 2021 Patient seen and examined at bedside Patient complaining of sore throat and cough Also he has not had a bowel movement he had some abdominal pain.  Nurse reported abdomen is getting distended  Assessment/Plan: Active Problems:   Acute diverticulitis Recurrent acute small bowel diverticulitis with suspected microperforation:  Patient presented to the emergency department with abdominal pain with nausea CT shows diverticulitis with microperforation She was started on IV antibiotics cefepime and Flagyl Will continue IV hydration and adequate pain control Surgery was consulted and he underwent exploratory laparotomy on June 22, 2021 with small bowel resection and anastomosis, He is doing well   AKI on CKD stage IIIb: Continue IV hydration Monitor creatinine Avoid nephrotoxic medication   COPD without exacerbation: Continue DuoNeb and Pulmicort   Essential hypertension: Continue Norvasc Hyzaar on hold due to AKI   Depression: Continue Lexapro   GERD:  Continue Protonix   Chronic anemia: Continue ferrous sulfate  Code Status: DNR  Severity of Illness: The appropriate patient status for  this patient is INPATIENT. Inpatient status is judged to be reasonable and necessary in order to provide the required intensity of service to ensure the patient's safety. The patient's presenting symptoms, physical exam findings, and initial radiographic and laboratory data in the context of their chronic comorbidities is felt to place them at high risk for further clinical deterioration. Furthermore, it is not anticipated that the patient will be medically stable for discharge from the hospital within 2 midnights of admission.   * I certify that at the point of admission it is my clinical judgment that the patient will require inpatient hospital care spanning beyond 2 midnights from the point of admission due to high intensity of service, high risk for further deterioration and high frequency of surveillance required.*   Family Communication: NON  Disposition Plan:  Status is: Inpatient   Dispo: The patient is from: Home              Anticipated d/c is to:               Anticipated d/c date is:               Patient currently not medically stable for discharge  Consultants: GEN SURG   Procedures: Exploratory laparotomy with small bowel resection.  06/22/21  Antimicrobials: CEFEPIME   DVT prophylaxis: LOVENOX   Objective: Vitals:   06/22/21 2208 06/22/21 2338 06/23/21 0550 06/23/21 0814  BP: (!) 100/59 (!) 121/54 104/63 (!) 117/59  Pulse: 83 85 82 73  Resp: 16 18 20 18   Temp: 98 F (36.7 C) 97.6 F (36.4 C) 97.6 F (36.4 C) (!) 97.5 F (36.4 C)  TempSrc: Oral  Oral Oral  SpO2: 94% 95% 94% 91%  Weight:  Height:        Intake/Output Summary (Last 24 hours) at 06/23/2021 0902 Last data filed at 06/23/2021 0648 Gross per 24 hour  Intake 2100 ml  Output 995 ml  Net 1105 ml   Filed Weights   06/22/21 0406 06/22/21 2134  Weight: 94.3 kg 96.2 kg   Body mass index is 31.78 kg/m.  Exam:  General: 87 y.o. year-old male well developed well nourished in no acute  distress.  Alert and oriented x3. Cardiovascular: Regular rate and rhythm with no rubs or gallops.  No thyromegaly or JVD noted.   Respiratory: Clear to auscultation with no wheezes or rales. Good inspiratory effort. Abdomen: Firm mildly tender and distended colon with normal bowel sounds x4 quadrants. Musculoskeletal: No lower extremity edema. 2/4 pulses in all 4 extremities. Skin: No ulcerative lesions noted or rashes, Psychiatry: Mood is appropriate for condition and setting Neurology:    Data Reviewed: CBC: Recent Labs  Lab 06/21/21 1621 06/22/21 0515 06/23/21 0439  WBC 15.7* 12.2* 14.9*  HGB 10.5* 9.2* 9.5*  HCT 31.0* 27.3* 28.4*  MCV 93.1 91.3 93.1  PLT 233 195 469   Basic Metabolic Panel: Recent Labs  Lab 06/21/21 1621 06/22/21 0515 06/23/21 0439  NA 131* 134* 134*  K 4.3 4.2 4.7  CL 96* 103 105  CO2 25 24 21*  GLUCOSE 114* 101* 160*  BUN 56* 54* 53*  CREATININE 2.65* 2.35* 2.51*  CALCIUM 8.7* 8.2* 8.0*  MG  --   --  2.2  PHOS  --   --  5.4*   GFR: Estimated Creatinine Clearance: 20.9 mL/min (A) (by C-G formula based on SCr of 2.51 mg/dL (H)). Liver Function Tests: Recent Labs  Lab 06/21/21 1621  AST 13*  ALT 9  ALKPHOS 55  BILITOT 0.9  PROT 7.0  ALBUMIN 4.2   Recent Labs  Lab 06/21/21 1621  LIPASE 34   No results for input(s): AMMONIA in the last 168 hours. Coagulation Profile: No results for input(s): INR, PROTIME in the last 168 hours. Cardiac Enzymes: No results for input(s): CKTOTAL, CKMB, CKMBINDEX, TROPONINI in the last 168 hours. BNP (last 3 results) No results for input(s): PROBNP in the last 8760 hours. HbA1C: No results for input(s): HGBA1C in the last 72 hours. CBG: No results for input(s): GLUCAP in the last 168 hours. Lipid Profile: No results for input(s): CHOL, HDL, LDLCALC, TRIG, CHOLHDL, LDLDIRECT in the last 72 hours. Thyroid Function Tests: No results for input(s): TSH, T4TOTAL, FREET4, T3FREE, THYROIDAB in the last  72 hours. Anemia Panel: No results for input(s): VITAMINB12, FOLATE, FERRITIN, TIBC, IRON, RETICCTPCT in the last 72 hours. Urine analysis:    Component Value Date/Time   COLORURINE YELLOW (A) 06/21/2021 2038   APPEARANCEUR CLEAR (A) 06/21/2021 2038   APPEARANCEUR Clear 03/19/2014 1049   LABSPEC 1.016 06/21/2021 2038   LABSPEC 1.016 03/19/2014 1049   PHURINE 5.0 06/21/2021 2038   GLUCOSEU NEGATIVE 06/21/2021 2038   GLUCOSEU Negative 03/19/2014 1049   HGBUR NEGATIVE 06/21/2021 2038   BILIRUBINUR NEGATIVE 06/21/2021 2038   BILIRUBINUR Negative 03/19/2014 [place] 06/21/2021 2038   PROTEINUR 30 (A) 06/21/2021 2038   NITRITE NEGATIVE 06/21/2021 2038   LEUKOCYTESUR NEGATIVE 06/21/2021 2038   LEUKOCYTESUR Negative 03/19/2014 1049   Sepsis Labs: @LABRCNTIP (procalcitonin:4,lacticidven:4)  ) Recent Results (from the past 240 hour(s))  Resp Panel by RT-PCR (Flu A&B, Covid) Nasopharyngeal Swab     Status: None   Collection Time: 06/21/21  8:38 PM  Specimen: Nasopharyngeal Swab; Nasopharyngeal(NP) swabs in vial transport medium  Result Value Ref Range Status   SARS Coronavirus 2 by RT PCR NEGATIVE NEGATIVE Final    Comment: (NOTE) SARS-CoV-2 target nucleic acids are NOT DETECTED.  The SARS-CoV-2 RNA is generally detectable in upper respiratory specimens during the acute phase of infection. The lowest concentration of SARS-CoV-2 viral copies this assay can detect is 138 copies/mL. A negative result does not preclude SARS-Cov-2 infection and should not be used as the sole basis for treatment or other patient management decisions. A negative result may occur with  improper specimen collection/handling, submission of specimen other than nasopharyngeal swab, presence of viral mutation(s) within the areas targeted by this assay, and inadequate number of viral copies(<138 copies/mL). A negative result must be combined with clinical observations, patient history, and  epidemiological information. The expected result is Negative.  Fact Sheet for Patients:  EntrepreneurPulse.com.au  Fact Sheet for Healthcare Providers:  IncredibleEmployment.be  This test is no t yet approved or cleared by the [place] FDA and  has been authorized for detection and/or diagnosis of SARS-CoV-2 by FDA under an Emergency Use Authorization (EUA). This EUA will remain  in effect (meaning this test can be used) for the duration of the COVID-19 declaration under Section 564(b)(1) of the Act, 21 U.S.C.section 360bbb-3(b)(1), unless the authorization is terminated  or revoked sooner.       Influenza A by PCR NEGATIVE NEGATIVE Final   Influenza B by PCR NEGATIVE NEGATIVE Final    Comment: (NOTE) The Xpert Xpress SARS-CoV-2/FLU/RSV plus assay is intended as an aid in the diagnosis of influenza from Nasopharyngeal swab specimens and should not be used as a sole basis for treatment. Nasal washings and aspirates are unacceptable for Xpert Xpress SARS-CoV-2/FLU/RSV testing.  Fact Sheet for Patients: EntrepreneurPulse.com.au  Fact Sheet for Healthcare Providers: IncredibleEmployment.be  This test is not yet approved or cleared by the [place] FDA and has been authorized for detection and/or diagnosis of SARS-CoV-2 by FDA under an Emergency Use Authorization (EUA). This EUA will remain in effect (meaning this test can be used) for the duration of the COVID-19 declaration under Section 564(b)(1) of the Act, 21 U.S.C. section 360bbb-3(b)(1), unless the authorization is terminated or revoked.  Performed at [facility], [address] [place], [place] [address]       Studies: No results found.  Scheduled Meds:  acidophilus  2 capsule Oral TID   amLODipine  5 mg Oral Daily   cholecalciferol  1,000 Units Oral Daily   enoxaparin (LOVENOX) injection  40 mg Subcutaneous Q24H    escitalopram  10 mg Oral Daily   famotidine  10 mg Oral BID   ferrous sulfate  325 mg Oral Q breakfast   mouth rinse  15 mL Mouth Rinse BID   traZODone  150 mg Oral QHS    Continuous Infusions:  sodium chloride 100 mL/hr at 06/22/21 2140   ceFEPime (MAXIPIME) IV     metronidazole 500 mg (06/23/21 5537)     LOS: 2 days     Cristal Deer, MD [facility]  To reach me or the doctor on call, go to: [email or web address] Password [email or web address]  06/23/2021, 9:02 AM

## 2021-06-23 NOTE — Progress Notes (Signed)
Patient ID: Trevor Ferguson, male   DOB: 12/14/1925, 87 y.o.   MRN: [id number]     [place] Hospital Day(s): 2.   Interval History: Patient seen and examined, no acute events or new complaints overnight. Patient reports having some pain on surgical area with coughing.  Otherwise he said that he has been doing well.  He did have some disorientation overnight and took out the NGT.  No nausea or vomiting.  Still not passing gas.  Vital signs in last 24 hours: [min-max] current  Temp:  [97.1 F (36.2 C)-98 F (36.7 C)] 97.5 F (36.4 C) (11/05 0814) Pulse Rate:  [70-89] 73 (11/05 0814) Resp:  [12-20] 18 (11/05 0814) BP: (91-129)/(48-88) 117/59 (11/05 0814) SpO2:  [91 %-98 %] 91 % (11/05 0814) Weight:  [96.2 kg] 96.2 kg (11/04 2134)     Height: 5' 8.5" (174 cm) Weight: 96.2 kg BMI (Calculated): 31.78   Physical Exam:  Constitutional: alert, cooperative and no distress  Respiratory: breathing non-labored at rest  Cardiovascular: regular rate and sinus rhythm  Gastrointestinal: soft, non-tender, and non-distended.  Wound is dry and clean.  Labs:  CBC Latest Ref Rng & Units 06/23/2021 06/22/2021 06/21/2021  WBC 4.0 - 10.5 K/uL 14.9(H) 12.2(H) 15.7(H)  Hemoglobin 13.0 - 17.0 g/dL 9.5(L) 9.2(L) 10.5(L)  Hematocrit 39.0 - 52.0 % 28.4(L) 27.3(L) 31.0(L)  Platelets 150 - 400 K/uL 188 195 233   CMP Latest Ref Rng & Units 06/23/2021 06/22/2021 06/21/2021  Glucose 70 - 99 mg/dL 160(H) 101(H) 114(H)  BUN 8 - 23 mg/dL 53(H) 54(H) 56(H)  Creatinine 0.61 - 1.24 mg/dL 2.51(H) 2.35(H) 2.65(H)  Sodium 135 - 145 mmol/L 134(L) 134(L) 131(L)  Potassium 3.5 - 5.1 mmol/L 4.7 4.2 4.3  Chloride 98 - 111 mmol/L 105 103 96(L)  CO2 22 - 32 mmol/L 21(L) 24 25  Calcium 8.9 - 10.3 mg/dL 8.0(L) 8.2(L) 8.7(L)  Total Protein 6.5 - 8.1 g/dL - - 7.0  Total Bilirubin 0.3 - 1.2 mg/dL - - 0.9  Alkaline Phos 38 - 126 U/L - - 55  AST 15 - 41 U/L - - 13(L)  ALT 0 - 44 U/L - - 9    Imaging studies: No new  pertinent imaging studies   Assessment/Plan:  87 y.o. male with diverticulitis of small intestine with perforation 1 Day Post-Op s/p small bowel resection and anastomosis, complicated by pertinent comorbidities including COPD, hypertension, stage IIIb chronic kidney disease, history of CVA, sleep apnea.  Patient today stable.  No fever, no tachycardia.  Abdomen with localized pain in surgical area with coughing but otherwise seems to be benign.  No nausea or vomiting.  Patient remove NGT upon episode of disorientation overnight.  I will start slowly clear liquid diet.  I oriented the patient to take sips of the liquids slowly.  If no nausea will continue to advance slowly.  I encouraged the patient to get out of bed.  PT evaluation recommended and ordered.  Also recommend the patient to do incentive parameter.  Appreciate hospitalist management of multiple patient's medical comorbidities.  I will continue to follow closely.  Arnold Long, MD

## 2021-06-23 NOTE — Progress Notes (Signed)
Order Requisition for prayer. Met with this very kind gentleman. Provided presence and conversation, then prayer.

## 2021-06-24 ENCOUNTER — Inpatient Hospital Stay: Payer: Medicare PPO

## 2021-06-24 DIAGNOSIS — K5792 Diverticulitis of intestine, part unspecified, without perforation or abscess without bleeding: Secondary | ICD-10-CM | POA: Diagnosis not present

## 2021-06-24 NOTE — Progress Notes (Signed)
Patient ID: Trevor Ferguson, male   DOB: 1926/04/21, 87 y.o.   MRN: [id number]     [facility] Hospital Day(s): 3.   Interval History: Patient seen and examined, no acute events or new complaints overnight. Patient reports feeling okay this morning.  He told me that he fell yesterday.  He denies any loss of consciousness.  He denies any other specific pain other than the surgical pain.  Denies any nausea or vomiting.  He endorses that he had a loose bowel movement.  This is not charted.    Vital signs in last 24 hours: [min-max] current  Temp:  [97.5 F (36.4 C)-98.1 F (36.7 C)] 97.8 F (36.6 C) (11/06 0752) Pulse Rate:  [73-97] 76 (11/06 0752) Resp:  [18] 18 (11/06 0752) BP: (100-132)/(51-63) 109/57 (11/06 0752) SpO2:  [92 %-96 %] 95 % (11/06 0752)     Height: 5' 8.5" (174 cm) Weight: 96.2 kg BMI (Calculated): 31.78   Physical Exam:  Constitutional: alert, cooperative and no distress  Respiratory: breathing non-labored at rest  Cardiovascular: regular rate and sinus rhythm  Gastrointestinal: soft, non-tender, and mild-distended  Labs:  CBC Latest Ref Rng & Units 06/23/2021 06/22/2021 06/21/2021  WBC 4.0 - 10.5 K/uL 14.9(H) 12.2(H) 15.7(H)  Hemoglobin 13.0 - 17.0 g/dL 9.5(L) 9.2(L) 10.5(L)  Hematocrit 39.0 - 52.0 % 28.4(L) 27.3(L) 31.0(L)  Platelets 150 - 400 K/uL 188 195 233   CMP Latest Ref Rng & Units 06/23/2021 06/22/2021 06/21/2021  Glucose 70 - 99 mg/dL 160(H) 101(H) 114(H)  BUN 8 - 23 mg/dL 53(H) 54(H) 56(H)  Creatinine 0.61 - 1.24 mg/dL 2.51(H) 2.35(H) 2.65(H)  Sodium 135 - 145 mmol/L 134(L) 134(L) 131(L)  Potassium 3.5 - 5.1 mmol/L 4.7 4.2 4.3  Chloride 98 - 111 mmol/L 105 103 96(L)  CO2 22 - 32 mmol/L 21(L) 24 25  Calcium 8.9 - 10.3 mg/dL 8.0(L) 8.2(L) 8.7(L)  Total Protein 6.5 - 8.1 g/dL - - 7.0  Total Bilirubin 0.3 - 1.2 mg/dL - - 0.9  Alkaline Phos 38 - 126 U/L - - 55  AST 15 - 41 U/L - - 13(L)  ALT 0 - 44 U/L - - 9    Imaging studies: No new pertinent  imaging studies   Assessment/Plan:  86 y.o. male with diverticulitis of small intestine with perforation 2 Day Post-Op s/p small bowel resection and anastomosis, complicated by pertinent comorbidities including COPD, hypertension, stage IIIb chronic kidney disease, history of CVA, sleep apnea.  Patient did not have any clinical deterioration.  Mildly distended.  He does endorses passing as loose bowel movement.  The abdomen is mildly distended so I will not advance his diet until we have a more significant GI function.  We will keep him in clear liquid diet at this moment.  I encouraged the patient to ambulate with assistance.  Fall yesterday does not seem to have any complications.  Patient understand that he has to notify staff if he needs to get out of bed.  As per PT note yesterday patient declined physical therapy.  This does not make much sense since there is no specific reason why he declined.  Hopefully they can reevaluate today and start physical therapy.  Will continue to follow closely.  Arnold Long, MD

## 2021-06-24 NOTE — Progress Notes (Addendum)
PROGRESS NOTE  Trevor Ferguson ERX:[id number] DOB: 1925/11/09 DOA: 06/21/2021 PCP: [name], MD  HPI/Recap of past 24 hours:  *Brief Narrative:   This is a 87 year old male with past medical history significant for COPD hypertension, hyperlipidemia, sleep apnea, CKD stage IIIb, CVA, who presented to the emergency room with acute onset of right lower quadrant abdominal pain which has started a couple of weeks prior.  CT scan showed recurrent acute small bowel diverticulitis with small amount of mottled extra luminal gas in the mesentery consistent with microperforation there was no focal or drainable collection they also found regional small bowel ileus.  Surgery was therefore consulted and patient had exploratory laparotomy on June 22, 2021  Subjective: June 23, 2021 Patient seen and examined at bedside Patient complaining of sore throat and cough Also he has not had a bowel movement he had some abdominal pain.  Nurse reported abdomen is getting distended  June 24, 2021: Patient seen and examined at bedside he is eating well he does say however that he still has not had a bowel movement but he did have some loose stool and he had an accident did not even know he had made bowel movement  Assessment/Plan: Active Problems:   Acute diverticulitis Recurrent acute small bowel diverticulitis with suspected microperforation:  Patient presented to the emergency department with abdominal pain with nausea CT shows diverticulitis with microperforation She was started on IV antibiotics cefepime and Flagyl Will continue IV hydration and adequate pain control Surgery was consulted and he underwent exploratory laparotomy small bowel resection and anastomosis,on June 22, 2021 He is doing well   AKI on CKD stage IIIb: Continue IV hydration Monitor creatinine Avoid nephrotoxic medication   COPD without exacerbation: Continue DuoNeb and Pulmicort   Essential  hypertension: Continue Norvasc Hyzaar on hold due to AKI   Depression: Continue Lexapro   GERD:  Continue Protonix   Chronic anemia: Continue ferrous sulfate  Possible constipation: Obtain x-ray of the abdomen  Code Status: DNR  Severity of Illness: The appropriate patient status for this patient is INPATIENT. Inpatient status is judged to be reasonable and necessary in order to provide the required intensity of service to ensure the patient's safety. The patient's presenting symptoms, physical exam findings, and initial radiographic and laboratory data in the context of their chronic comorbidities is felt to place them at high risk for further clinical deterioration. Furthermore, it is not anticipated that the patient will be medically stable for discharge from the hospital within 2 midnights of admission.   * I certify that at the point of admission it is my clinical judgment that the patient will require inpatient hospital care spanning beyond 2 midnights from the point of admission due to high intensity of service, high risk for further deterioration and high frequency of surveillance required.*   Family Communication: NON  Disposition Plan:  Status is: Inpatient   Dispo: The patient is from: Home              Anticipated d/c is to:               Anticipated d/c date is:               Patient currently not medically stable for discharge  Consultants: GEN SURG   Procedures: Exploratory laparotomy with small bowel resection.  06/22/21  Antimicrobials: CEFEPIME   DVT prophylaxis: LOVENOX   Objective: Vitals:   06/24/21 0752 06/24/21 1028 06/24/21 1036 06/24/21 1621  BP: Marland Kitchen)  109/57   (!) 126/58  Pulse: 76   84  Resp: 18   18  Temp: 97.8 F (36.6 C)   98.7 F (37.1 C)  TempSrc: Oral     SpO2: 95% (!) 85% (!) 85% 96%  Weight:      Height:        Intake/Output Summary (Last 24 hours) at 06/24/2021 1950 Last data filed at 06/24/2021 1516 Gross per 24 hour   Intake 895.4 ml  Output 151 ml  Net 744.4 ml    Filed Weights   06/22/21 0406 06/22/21 2134  Weight: 94.3 kg 96.2 kg   Body mass index is 31.78 kg/m.  Exam:  General: 87 y.o. year-old male well developed well nourished in no acute distress.  Alert and oriented x3. Cardiovascular: Regular rate and rhythm with no rubs or gallops.  No thyromegaly or JVD noted.   Respiratory: Clear to auscultation with no wheezes or rales. Good inspiratory effort. Abdomen: Firm mildly tender and distended colon with normal bowel sounds x4 quadrants. Musculoskeletal: No lower extremity edema. 2/4 pulses in all 4 extremities. Skin: No ulcerative lesions noted or rashes, Psychiatry: Mood is appropriate for condition and setting Neurology:    Data Reviewed: CBC: Recent Labs  Lab 06/21/21 1621 06/22/21 0515 06/23/21 0439  WBC 15.7* 12.2* 14.9*  HGB 10.5* 9.2* 9.5*  HCT 31.0* 27.3* 28.4*  MCV 93.1 91.3 93.1  PLT 233 195 627    Basic Metabolic Panel: Recent Labs  Lab 06/21/21 1621 06/22/21 0515 06/23/21 0439  NA 131* 134* 134*  K 4.3 4.2 4.7  CL 96* 103 105  CO2 25 24 21*  GLUCOSE 114* 101* 160*  BUN 56* 54* 53*  CREATININE 2.65* 2.35* 2.51*  CALCIUM 8.7* 8.2* 8.0*  MG  --   --  2.2  PHOS  --   --  5.4*    GFR: Estimated Creatinine Clearance: 20.9 mL/min (A) (by C-G formula based on SCr of 2.51 mg/dL (H)). Liver Function Tests: Recent Labs  Lab 06/21/21 1621  AST 13*  ALT 9  ALKPHOS 55  BILITOT 0.9  PROT 7.0  ALBUMIN 4.2    Recent Labs  Lab 06/21/21 1621  LIPASE 34    No results for input(s): AMMONIA in the last 168 hours. Coagulation Profile: No results for input(s): INR, PROTIME in the last 168 hours. Cardiac Enzymes: No results for input(s): CKTOTAL, CKMB, CKMBINDEX, TROPONINI in the last 168 hours. BNP (last 3 results) No results for input(s): PROBNP in the last 8760 hours. HbA1C: No results for input(s): HGBA1C in the last 72 hours. CBG: No results  for input(s): GLUCAP in the last 168 hours. Lipid Profile: No results for input(s): CHOL, HDL, LDLCALC, TRIG, CHOLHDL, LDLDIRECT in the last 72 hours. Thyroid Function Tests: No results for input(s): TSH, T4TOTAL, FREET4, T3FREE, THYROIDAB in the last 72 hours. Anemia Panel: No results for input(s): VITAMINB12, FOLATE, FERRITIN, TIBC, IRON, RETICCTPCT in the last 72 hours. Urine analysis:    Component Value Date/Time   COLORURINE YELLOW (A) 06/21/2021 2038   APPEARANCEUR CLEAR (A) 06/21/2021 2038   APPEARANCEUR Clear 03/19/2014 1049   LABSPEC 1.016 06/21/2021 2038   LABSPEC 1.016 03/19/2014 1049   PHURINE 5.0 06/21/2021 2038   GLUCOSEU NEGATIVE 06/21/2021 2038   GLUCOSEU Negative 03/19/2014 1049   HGBUR NEGATIVE 06/21/2021 2038   BILIRUBINUR NEGATIVE 06/21/2021 2038   BILIRUBINUR Negative 03/19/2014 [place] 06/21/2021 2038   PROTEINUR 30 (A) 06/21/2021 2038   NITRITE  NEGATIVE 06/21/2021 2038   LEUKOCYTESUR NEGATIVE 06/21/2021 2038   LEUKOCYTESUR Negative 03/19/2014 1049   Sepsis Labs: @LABRCNTIP (procalcitonin:4,lacticidven:4)  ) Recent Results (from the past 240 hour(s))  Resp Panel by RT-PCR (Flu A&B, Covid) Nasopharyngeal Swab     Status: None   Collection Time: 06/21/21  8:38 PM   Specimen: Nasopharyngeal Swab; Nasopharyngeal(NP) swabs in vial transport medium  Result Value Ref Range Status   SARS Coronavirus 2 by RT PCR NEGATIVE NEGATIVE Final    Comment: (NOTE) SARS-CoV-2 target nucleic acids are NOT DETECTED.  The SARS-CoV-2 RNA is generally detectable in upper respiratory specimens during the acute phase of infection. The lowest concentration of SARS-CoV-2 viral copies this assay can detect is 138 copies/mL. A negative result does not preclude SARS-Cov-2 infection and should not be used as the sole basis for treatment or other patient management decisions. A negative result may occur with  improper specimen collection/handling, submission of  specimen other than nasopharyngeal swab, presence of viral mutation(s) within the areas targeted by this assay, and inadequate number of viral copies(<138 copies/mL). A negative result must be combined with clinical observations, patient history, and epidemiological information. The expected result is Negative.  Fact Sheet for Patients:  EntrepreneurPulse.com.au  Fact Sheet for Healthcare Providers:  IncredibleEmployment.be  This test is no t yet approved or cleared by the [place] FDA and  has been authorized for detection and/or diagnosis of SARS-CoV-2 by FDA under an Emergency Use Authorization (EUA). This EUA will remain  in effect (meaning this test can be used) for the duration of the COVID-19 declaration under Section 564(b)(1) of the Act, 21 U.S.C.section 360bbb-3(b)(1), unless the authorization is terminated  or revoked sooner.       Influenza A by PCR NEGATIVE NEGATIVE Final   Influenza B by PCR NEGATIVE NEGATIVE Final    Comment: (NOTE) The Xpert Xpress SARS-CoV-2/FLU/RSV plus assay is intended as an aid in the diagnosis of influenza from Nasopharyngeal swab specimens and should not be used as a sole basis for treatment. Nasal washings and aspirates are unacceptable for Xpert Xpress SARS-CoV-2/FLU/RSV testing.  Fact Sheet for Patients: EntrepreneurPulse.com.au  Fact Sheet for Healthcare Providers: IncredibleEmployment.be  This test is not yet approved or cleared by the [place] FDA and has been authorized for detection and/or diagnosis of SARS-CoV-2 by FDA under an Emergency Use Authorization (EUA). This EUA will remain in effect (meaning this test can be used) for the duration of the COVID-19 declaration under Section 564(b)(1) of the Act, 21 U.S.C. section 360bbb-3(b)(1), unless the authorization is terminated or revoked.  Performed at [facility], [address] [place], [place] [address]       Studies: CT HEAD WO CONTRAST (5MM)  Result Date: 06/23/2021 CLINICAL DATA:  Unwitnessed fall. EXAM: CT HEAD WITHOUT CONTRAST TECHNIQUE: Contiguous axial images were obtained from the base of the skull through the vertex without intravenous contrast. COMPARISON:  Head CT 03/07/2019 FINDINGS: Brain: Age related atrophy and chronic small vessel ischemia. No intracranial hemorrhage, mass effect, or midline shift. No hydrocephalus. The basilar cisterns are patent. No evidence of territorial infarct or acute ischemia. No extra-axial or intracranial fluid collection. Vascular: Atherosclerosis of skullbase vasculature without hyperdense vessel or abnormal calcification. Skull: No fracture or focal lesion. Sinuses/Orbits: Chronic opacification of right ethmoid air cells. Trace mucosal thickening in left side of sphenoid sinus. Bilateral cataract resection Other: No confluent scalp hematoma. IMPRESSION: 1. No acute intracranial abnormality. No skull fracture. 2. Age related atrophy and chronic small  vessel ischemia. Electronically Signed   By: Keith Rake M.D.   On: 06/23/2021 22:35    Scheduled Meds:  acidophilus  2 capsule Oral TID   amLODipine  5 mg Oral Daily   budesonide (PULMICORT) nebulizer solution  0.5 mg Nebulization Daily   cholecalciferol  1,000 Units Oral Daily   enoxaparin (LOVENOX) injection  40 mg Subcutaneous Q24H   escitalopram  10 mg Oral Daily   famotidine  10 mg Oral BID   ferrous sulfate  325 mg Oral Q breakfast   mouth rinse  15 mL Mouth Rinse BID   traZODone  150 mg Oral QHS    Continuous Infusions:  sodium chloride 50 mL/hr at 06/24/21 1454   ceFEPime (MAXIPIME) IV 2 g (06/23/21 2325)   metronidazole 500 mg (06/24/21 1454)     LOS: 3 days     Cristal Deer, MD [facility]  To reach me or the doctor on call, go to: [email or web address] Password [email or web address]  06/24/2021, 7:50 PM

## 2021-06-24 NOTE — Evaluation (Signed)
Physical Therapy Evaluation Patient Details Name: Trevor Ferguson MRN: [id number] DOB: 1926/07/26 Today's Date: 06/24/2021  History of Present Illness  Patient is a 87 year old male with PMH of COPD, prostate cancer, HTN, sleep apnea, stage IIIB CKD and CVA. He presented to ER with acute onset of R lower quadrant abdominal pain with nausea and dry heaves.  Patient underwent exploratory laparotomy with small bowl resection on 06/22/21. Patient fell on 06/23/21 while in hospital. Patient came from [place] Impression  Patient is a pleasant 87 year old male who presents with generalized weakness and instability. Prior to hospital admission, pt was living at [facility] where he received assistance with iADLs and ADLs of putting shoes and socks on. Patient is ambulatory for short distances at baseline. Last night patient had a fall in the hospital and room is set up for high fall risk.  Patient is in bed and agreeable to participation with therapy upon PT arrival. Patient required assistance for sitting EOB due to tangling feet as well as abdominal pain. Patient transitioned sitting to standing from elevated surface with min A and cues for safety. He reports abdominal pain with transition. Upon ambulating patient has significant weaving and instability requiring min A and noticeable SP02 drop to 95% while on 4L of 02 via nasal cannula. Patient transitioned to chair with cues for purse lipped breathing. Upon sitting in chair SP02 raised back to 96%. Patient's needs met and alarms set. Education on utilization of pillow on abdomen when needing to cough performed. Pt would benefit from skilled PT to address noted impairments and functional limitations (see below for any additional details).  Upon hospital discharge, pt would benefit from return to SNF at [facility].        Recommendations for follow up therapy are one component of a multi-disciplinary discharge planning process, led by the  attending physician.  Recommendations may be updated based on patient status, additional functional criteria and insurance authorization.  Follow Up Recommendations Skilled nursing-short term rehab (<3 hours/day)    Assistance Recommended at Discharge Intermittent Supervision/Assistance  Functional Status Assessment Patient has had a recent decline in their functional status and demonstrates the ability to make significant improvements in function in a reasonable and predictable amount of time.  Equipment Recommendations  None recommended by PT (if return to SNF)    Recommendations for Other Services       Precautions / Restrictions Precautions Precautions: Fall Restrictions Weight Bearing Restrictions: No      Mobility  Bed Mobility Overal bed mobility: Needs Assistance Bed Mobility: Supine to Sit     Supine to sit: Min guard     General bed mobility comments: cues for log rolling due to abdominal pain, heavy reliance upon railing, feet tangled, required min A for untangling feet.    Transfers Overall transfer level: Needs assistance Equipment used: Rolling Biggar (2 wheels) Transfers: Sit to/from Stand Sit to Stand: From elevated surface;Min assist           General transfer comment: min A for STS transfer secondary tu pain in abdomen with transfer, requires elevated surface due to patient reporting feeling weak and unable from lower surface.    Ambulation/Gait Ambulation/Gait assistance: Min assist Gait Distance (Feet): 12 Feet Assistive device: Rolling Janik (2 wheels) Gait Pattern/deviations: Step-through pattern;Shuffle;Trunk flexed;Drifts right/left Gait velocity: decreased   General Gait Details: Patient is unsafe with ambulation within room with frequent drift, staggers, and occasional Min A to  remain upright, reports increased abdominal pain with ambulation. Returned to seated due to SP02 drop to 85% on 4L of o2 via nasal cannula  Stairs             Wheelchair Mobility    Modified Rankin (Stroke Patients Only)       Balance Overall balance assessment: Needs assistance;History of Falls Sitting-balance support: Feet supported Sitting balance-Leahy Scale: Poor Sitting balance - Comments: abdominal pain limiting seated balance   Standing balance support: Bilateral upper extremity supported;Reliant on assistive device for balance Standing balance-Leahy Scale: Poor Standing balance comment: Patient relies heavily on RW for stability in standing, SP02 droppage additional factor.                             Pertinent Vitals/Pain Pain Assessment: Faces Faces Pain Scale: Hurts whole lot Pain Location: stomach Pain Descriptors / Indicators: Aching;Stabbing Pain Intervention(s): Limited activity within patient's tolerance;Monitored during session;Repositioned    Home Living Family/patient expects to be discharged to:: Skilled nursing facility                   Additional Comments: Patient resides at [facility]    Prior Function Prior Level of Function : Needs assist       Physical Assist : Mobility (physical);ADLs (physical) Mobility (physical): Stairs;Gait ADLs (physical): Dressing Mobility Comments: Patient reports he walks with RW at baseline for short and long distances. ADLs Comments: Needs assistance with putting shoes and socks on.     Hand Dominance   Dominant Hand: Right    Extremity/Trunk Assessment   Upper Extremity Assessment Upper Extremity Assessment: Defer to OT evaluation    Lower Extremity Assessment Lower Extremity Assessment: Generalized weakness (grossly 4/5 bilaterally however limited hip flexion strength due to ambdominal pain.)    Cervical / Trunk Assessment Cervical / Trunk Assessment: Normal  Communication   Communication: No difficulties  Cognition Arousal/Alertness: Awake/alert Behavior During Therapy: WFL for tasks assessed/performed Overall Cognitive  Status: Within Functional Limits for tasks assessed                                 General Comments: Patient A and O x 4. Is pleasant but does not seem aware of his instability.        General Comments General comments (skin integrity, edema, etc.): Patient appears well nourished and groomed.    Exercises Other Exercises Other Exercises: Patient educated on role of PT in acute care setting, safe mobility and transfers, purse lipped breathing for SP02 increase, hugging pillow to abdomen when coughing.   Assessment/Plan    PT Assessment Patient needs continued PT services  PT Problem List Decreased strength;Decreased activity tolerance;Decreased balance;Decreased knowledge of use of DME;Decreased coordination;Decreased mobility;Decreased safety awareness;Decreased knowledge of precautions;Cardiopulmonary status limiting activity;Obesity;Pain       PT Treatment Interventions DME instruction;Gait training;Stair training;Functional mobility training;Therapeutic activities;Patient/family education;Cognitive remediation;Neuromuscular re-education;Balance training;Therapeutic exercise;Manual techniques    PT Goals (Current goals can be found in the Care Plan section)  Acute Rehab PT Goals Patient Stated Goal: to return back to SNF PT Goal Formulation: With patient Time For Goal Achievement: 07/08/21 Potential to Achieve Goals: Fair    Frequency Min 2X/week   Barriers to discharge Other (comment) will need SNF    Co-evaluation               AM-PAC PT "6 Clicks"  Mobility  Outcome Measure Help needed turning from your back to your side while in a flat bed without using bedrails?: A Little Help needed moving from lying on your back to sitting on the side of a flat bed without using bedrails?: A Lot Help needed moving to and from a bed to a chair (including a wheelchair)?: A Lot Help needed standing up from a chair using your arms (e.g., wheelchair or bedside  chair)?: A Lot Help needed to walk in hospital room?: A Little Help needed climbing 3-5 steps with a railing? : Total 6 Click Score: 13    End of Session Equipment Utilized During Treatment: Gait belt;Oxygen (3-4L of 02 via nasal cannula) Activity Tolerance: Patient limited by fatigue;Patient limited by pain Patient left: in chair;with call bell/phone within reach;with chair alarm set Nurse Communication: Mobility status;Other (comment) (Sp02 droppage with ambulation) PT Visit Diagnosis: Unsteadiness on feet (R26.81);Other abnormalities of gait and mobility (R26.89);Repeated falls (R29.6);Muscle weakness (generalized) (M62.81);Difficulty in walking, not elsewhere classified (R26.2);History of falling (Z91.81);Pain Pain - part of body:  (abdomen)    Time: 4742-5956 PT Time Calculation (min) (ACUTE ONLY): 26 min   Charges:   PT Evaluation $PT Eval Moderate Complexity: 1 Mod PT Treatments $Therapeutic Activity: 8-22 mins        Janna Arch, PT, DPT  06/24/2021, 10:54 AM

## 2021-06-24 NOTE — Progress Notes (Signed)
I was alerted by staff that patient was on the floor. Upon walking into the room, other staff members were successful with a  safe transfer from the floor back to the bed. The patient remained alert and oriented as this nurse takes over the post fall assessment. Vitals are WNL and patient is able to verbally deny pain. Neurological assessment did not warrant any acute deficits.Patient was last seen by this nurse sitting in his chair. Patient states, " my machine began to beep and I wanted to shut it off so I got up to do so after calling for help" . Patient is educated on the importance of using his call bell and alerting staff for assistance. Provider is made aware of the occurrence. Sharion Settler, NP came to the bedside to perform a post fall assessment as well and she verbally declined any new deficits. Sharion Settler, NP did order CT of the head to rule out any additional concerns. Patient's wife Suszanne Conners is brief of the occurrence over the phone. Patient is given another call light and it is placed in his view. Bed is lowered to floor. Fall mats are placed. Patient is upgraded from a low to high fall risk. Arm bands are applied. Patient verbally agrees to use the call bell and wait for staff assistance.

## 2021-06-24 NOTE — NC FL2 (Signed)
[place] LEVEL OF CARE SCREENING TOOL     IDENTIFICATION  Patient Name: Trevor Ferguson Birthdate: Jan 13, 1926 Sex: male Admission Date (Current Location): 06/21/2021  [facility] and [place] Number:  Engineering geologist and Address:  [facility], [address], [name], [place] [address]      Provider Number: [id number]  Attending Physician Name and Address:  Cristal Deer, MD  Relative Name and Phone Number:  Virginia Crews (Daughter)   [phone] (Mobile)    Current Level of Care: Hospital Recommended Level of Care: [place] Prior Approval Number:    Date Approved/Denied:   PASRR Number: [id number]  Discharge Plan:      Current Diagnoses: Patient Active Problem List   Diagnosis Date Noted   Acute GI bleeding    Polyp of sigmoid colon    Rectal bleeding    GI bleed 01/26/2021   Benign hypertensive kidney disease with chronic kidney disease 01/08/2021   Hemorrhoids    Rash    Depression    Weakness    Abdominal pain 03/06/2020   Acute kidney injury superimposed on CKD ([place]) 03/06/2020   Anemia 03/06/2020   Acute diverticulitis 03/01/2020   Pneumonia 07/17/2018   Leg swelling 12/18/2015   Chronic diastolic CHF (congestive heart failure) ([place]) 07/18/2015   Essential hypertension 07/18/2015   Prostate cancer ([place]) 06/02/2015   COPD (chronic obstructive pulmonary disease) ([place]) 03/28/2015   Sleep apnea 03/28/2015   SOB (shortness of breath) 03/28/2015   Personal history of transient ischemic attack (TIA), and cerebral infarction without residual deficits 09/21/2014    Orientation RESPIRATION BLADDER Height & Weight     Self, Time, Situation, Place  O2 Continent Weight: 212 lb 1.6 oz (96.2 kg) Height:  5' 8.5" (174 cm)  BEHAVIORAL SYMPTOMS/MOOD NEUROLOGICAL BOWEL NUTRITION STATUS        Diet (clear liquid)  AMBULATORY STATUS COMMUNICATION OF NEEDS Skin   Limited Assist Verbally Surgical wounds  (abdomen - honeycomb dressing)                       Personal Care Assistance Level of Assistance  Bathing, Feeding, Dressing Bathing Assistance: Limited assistance Feeding assistance: Independent Dressing Assistance: Limited assistance     Functional Limitations Info             SPECIAL CARE FACTORS FREQUENCY  PT (By licensed PT), OT (By licensed OT)     PT Frequency: 5 times per week OT Frequency: 5 times per week            Contractures      Additional Factors Info  Code Status, Allergies Code Status Info: DNR Allergies Info: penicillins, bee venom, darvon           Current Medications (06/24/2021):  This is the current hospital active medication list Current Facility-Administered Medications  Medication Dose Route Frequency Provider Last Rate Last Admin   0.9 %  sodium chloride infusion   Intravenous Continuous Mansy, Jan A, MD 100 mL/hr at 06/23/21 2323 New Bag at 06/23/21 2323   acetaminophen (TYLENOL) tablet 650 mg  650 mg Oral Q6H PRN Mansy, Jan A, MD       Or   acetaminophen (TYLENOL) suppository 650 mg  650 mg Rectal Q6H PRN Mansy, Jan A, MD       acidophilus (RISAQUAD) capsule 2 capsule  2 capsule Oral TID Mansy, Jan A, MD   2 capsule at 06/24/21 0933   albuterol (PROVENTIL) (2.5  MG/3ML) 0.083% nebulizer solution 2.5 mg  2.5 mg Nebulization Q4H PRN Cristal Deer, MD       amLODipine (NORVASC) tablet 5 mg  5 mg Oral Daily Mansy, Jan A, MD   5 mg at 06/24/21 0934   budesonide (PULMICORT) nebulizer solution 0.5 mg  0.5 mg Nebulization Daily Cristal Deer, MD   0.5 mg at 06/24/21 0734   ceFEPIme (MAXIPIME) 2 g in sodium chloride 0.9 % 100 mL IVPB  2 g Intravenous Q24H Mansy, Jan A, MD 200 mL/hr at 06/23/21 2325 2 g at 06/23/21 2325   cholecalciferol (VITAMIN D3) tablet 1,000 Units  1,000 Units Oral Daily Mansy, Jan A, MD   1,000 Units at 06/24/21 0934   enoxaparin (LOVENOX) injection 40 mg  40 mg Subcutaneous Q24H Mansy, Jan A, MD   40 mg at  06/24/21 0933   escitalopram (LEXAPRO) tablet 10 mg  10 mg Oral Daily Mansy, Jan A, MD   10 mg at 06/24/21 0934   famotidine (PEPCID) tablet 10 mg  10 mg Oral BID Renda Rolls, RPH   10 mg at 06/24/21 0934   ferrous sulfate tablet 325 mg  325 mg Oral Q breakfast Mansy, Jan A, MD   325 mg at 06/24/21 0934   guaiFENesin-dextromethorphan (ROBITUSSIN DM) 100-10 MG/5ML syrup 5 mL  5 mL Oral Q4H PRN Cristal Deer, MD   5 mL at 06/23/21 1319   lactulose (CHRONULAC) 10 GM/15ML solution 30 g  30 g Oral Daily PRN Cristal Deer, MD   30 g at 06/23/21 1557   MEDLINE mouth rinse  15 mL Mouth Rinse BID Shawna Clamp, MD   15 mL at 06/24/21 0934   menthol-cetylpyridinium (CEPACOL) lozenge 3 mg  1 lozenge Oral PRN Cristal Deer, MD       metroNIDAZOLE (FLAGYL) IVPB 500 mg  500 mg Intravenous Q8H Mansy, Jan A, MD 100 mL/hr at 06/24/21 0500 500 mg at 06/24/21 0500   morphine 2 MG/ML injection 2 mg  2 mg Intravenous Q4H PRN Mansy, Jan A, MD   2 mg at 06/23/21 1319   ondansetron (ZOFRAN) tablet 4 mg  4 mg Oral Q6H PRN Mansy, Jan A, MD       Or   ondansetron [facility]) injection 4 mg  4 mg Intravenous Q6H PRN Mansy, Jan A, MD       oxyCODONE-acetaminophen (PERCOCET/ROXICET) 5-325 MG per tablet 1 tablet  1 tablet Oral Q6H PRN Cristal Deer, MD   1 tablet at 06/23/21 2250   simethicone (MYLICON) chewable tablet 80 mg  80 mg Oral QID PRN Cristal Deer, MD   80 mg at 06/23/21 1557   traZODone (DESYREL) tablet 150 mg  150 mg Oral QHS Mansy, Jan A, MD   150 mg at 06/21/21 2252     Discharge Medications: Please see discharge summary for a list of discharge medications.  Relevant Imaging Results:  Relevant Lab Results:   Additional Information SS#: [id number]  [place], LCSW

## 2021-06-25 ENCOUNTER — Encounter: Payer: Self-pay | Admitting: General Surgery

## 2021-06-25 ENCOUNTER — Inpatient Hospital Stay: Payer: Medicare PPO

## 2021-06-25 DIAGNOSIS — K5792 Diverticulitis of intestine, part unspecified, without perforation or abscess without bleeding: Secondary | ICD-10-CM | POA: Diagnosis not present

## 2021-06-25 LAB — CBC WITH DIFFERENTIAL/PLATELET
Abs Immature Granulocytes: 0.32 10*3/uL — ABNORMAL HIGH (ref 0.00–0.07)
Basophils Absolute: 0.1 10*3/uL (ref 0.0–0.1)
Basophils Relative: 1 %
Eosinophils Absolute: 0.1 10*3/uL (ref 0.0–0.5)
Eosinophils Relative: 1 %
HCT: 26.4 % — ABNORMAL LOW (ref 39.0–52.0)
Hemoglobin: 8.7 g/dL — ABNORMAL LOW (ref 13.0–17.0)
Immature Granulocytes: 3 %
Lymphocytes Relative: 6 %
Lymphs Abs: 0.7 10*3/uL (ref 0.7–4.0)
MCH: 31.8 pg (ref 26.0–34.0)
MCHC: 33 g/dL (ref 30.0–36.0)
MCV: 96.4 fL (ref 80.0–100.0)
Monocytes Absolute: 1.8 10*3/uL — ABNORMAL HIGH (ref 0.1–1.0)
Monocytes Relative: 16 %
Neutro Abs: 8.5 10*3/uL — ABNORMAL HIGH (ref 1.7–7.7)
Neutrophils Relative %: 73 %
Platelets: 211 10*3/uL (ref 150–400)
RBC: 2.74 MIL/uL — ABNORMAL LOW (ref 4.22–5.81)
RDW: 15.8 % — ABNORMAL HIGH (ref 11.5–15.5)
WBC: 11.4 10*3/uL — ABNORMAL HIGH (ref 4.0–10.5)
nRBC: 0 % (ref 0.0–0.2)

## 2021-06-25 LAB — BASIC METABOLIC PANEL
Anion gap: 9 (ref 5–15)
BUN: 66 mg/dL — ABNORMAL HIGH (ref 8–23)
CO2: 18 mmol/L — ABNORMAL LOW (ref 22–32)
Calcium: 7.8 mg/dL — ABNORMAL LOW (ref 8.9–10.3)
Chloride: 104 mmol/L (ref 98–111)
Creatinine, Ser: 3.5 mg/dL — ABNORMAL HIGH (ref 0.61–1.24)
GFR, Estimated: 16 mL/min — ABNORMAL LOW (ref >60)
Glucose, Bld: 110 mg/dL — ABNORMAL HIGH (ref 70–99)
Potassium: 4.5 mmol/L (ref 3.5–5.1)
Sodium: 131 mmol/L — ABNORMAL LOW (ref 135–145)

## 2021-06-25 MED ORDER — HYDROCOD POLST-CPM POLST ER 10-8 MG/5ML PO SUER
5.0000 mL | Freq: Two times a day (BID) | ORAL | Status: DC
  Administered 2021-06-25 – 2021-06-26 (×2): 5 mL via ORAL
  Filled 2021-06-25 (×2): qty 5

## 2021-06-25 MED ORDER — BENZONATATE 100 MG PO CAPS
200.0000 mg | ORAL_CAPSULE | Freq: Three times a day (TID) | ORAL | Status: DC
  Administered 2021-06-25 – 2021-07-15 (×58): 200 mg via ORAL
  Filled 2021-06-25 (×59): qty 2

## 2021-06-25 NOTE — Progress Notes (Signed)
Patient ID: DINESH ULYSSE, male   DOB: March 07, 1926, 87 y.o.   MRN: [id number]     [place] Hospital Day(s): 4.   Interval History: Patient seen and examined, no acute events or new complaints overnight. Patient reports having a lot of cough.  Endorses having some nausea when having a coughing spell but otherwise he has been tolerating clear liquid diet without any problem.  As per nurse he had 2 bowel movements.  Vital signs in last 24 hours: [min-max] current  Temp:  [97.5 F (36.4 C)-98.7 F (37.1 C)] 97.6 F (36.4 C) (11/07 1542) Pulse Rate:  [82-90] 87 (11/07 1542) Resp:  [18-20] 20 (11/07 1542) BP: (92-126)/(53-62) 92/55 (11/07 1542) SpO2:  [90 %-96 %] 91 % (11/07 1542)     Height: 5' 8.5" (174 cm) Weight: 96.2 kg BMI (Calculated): 31.78   Physical Exam:  Constitutional: alert, cooperative and no distress  Respiratory: breathing non-labored at rest  Cardiovascular: regular rate and sinus rhythm  Gastrointestinal: soft, non-tender, but mild distended  Labs:  CBC Latest Ref Rng & Units 06/25/2021 06/23/2021 06/22/2021  WBC 4.0 - 10.5 K/uL 11.4(H) 14.9(H) 12.2(H)  Hemoglobin 13.0 - 17.0 g/dL 8.7(L) 9.5(L) 9.2(L)  Hematocrit 39.0 - 52.0 % 26.4(L) 28.4(L) 27.3(L)  Platelets 150 - 400 K/uL 211 188 195   CMP Latest Ref Rng & Units 06/25/2021 06/23/2021 06/22/2021  Glucose 70 - 99 mg/dL 110(H) 160(H) 101(H)  BUN 8 - 23 mg/dL 66(H) 53(H) 54(H)  Creatinine 0.61 - 1.24 mg/dL 3.50(H) 2.51(H) 2.35(H)  Sodium 135 - 145 mmol/L 131(L) 134(L) 134(L)  Potassium 3.5 - 5.1 mmol/L 4.5 4.7 4.2  Chloride 98 - 111 mmol/L 104 105 103  CO2 22 - 32 mmol/L 18(L) 21(L) 24  Calcium 8.9 - 10.3 mg/dL 7.8(L) 8.0(L) 8.2(L)  Total Protein 6.5 - 8.1 g/dL - - -  Total Bilirubin 0.3 - 1.2 mg/dL - - -  Alkaline Phos 38 - 126 U/L - - -  AST 15 - 41 U/L - - -  ALT 0 - 44 U/L - - -    Assessment/Plan:  87 y.o. male with diverticulitis of small intestine with perforation 3 Day Post-Op s/p small  bowel resection and anastomosis, complicated by pertinent comorbidities including COPD, hypertension, stage IIIb chronic kidney disease, history of CVA, sleep apnea.  Patient today without any clinical deterioration from surgical standpoint.  Patient has not vomited.  Still mildly distended.  Endorses passing gas.  I will keep him in clear liquid diet since anything that he is GI still slowly resolving ileus which is expected post surgery.  Patient had some physical therapy today.  This will greatly help the patient.  Continue DVT prophylaxis.  I will continue to follow closely.  Arnold Long, MD

## 2021-06-25 NOTE — Progress Notes (Signed)
Physical Therapy Treatment Patient Details Name: Trevor Ferguson MRN: [id number] DOB: 02-24-1926 Today's Date: 06/25/2021   History of Present Illness Patient is a 87 year old male with PMH of COPD, prostate cancer, HTN, sleep apnea, stage IIIB CKD and CVA. He presented to ER with acute onset of R lower quadrant abdominal pain with nausea and dry heaves.  Patient underwent exploratory laparotomy with small bowl resection on 06/22/21. Patient fell on 06/23/21 while in hospital. Patient came from [facility]    PT Comments    Pt received laying supine in bed and agreeable to PT treatment. Pt reporting that he felt nauseous after transfer back to chair and RN notified. Upon entering room, pt had removed oxygen and at 83% SpO2 and replaced with 3L O2 to improve saturation to 90% with pursed lip breathing cues. Pt reporting increased abdominal pain while coughing and educated on bracing with pillow to assist with pain. Pt able to stand 2 sets of 3 repetitions with RW + CGA from a signficantly elevated surface. Required minA for side stepping at EOB with RW for safety. With exertion, pt desaturated to 87% and would require seated rest break and cues for breathing to return to 90%. Pt returned to bed and left with all needs within reach and SpO2 at 94% on 3L. Recommendations for return to SNF at discharge remains appropriate.      Recommendations for follow up therapy are one component of a multi-disciplinary discharge planning process, led by the attending physician.  Recommendations may be updated based on patient status, additional functional criteria and insurance authorization.  Follow Up Recommendations  Skilled nursing-short term rehab (<3 hours/day)     Assistance Recommended at Discharge Intermittent Supervision/Assistance  Equipment Recommendations  None recommended by PT    Recommendations for Other Services       Precautions / Restrictions Precautions Precautions:  Fall Restrictions Weight Bearing Restrictions: No     Mobility  Bed Mobility Overal bed mobility: Needs Assistance Bed Mobility: Supine to Sit;Sit to Supine     Supine to sit: Min guard Sit to supine: Min assist   General bed mobility comments: MinA for management of LE due to abdominal pain for supine to sit.    Transfers Overall transfer level: Needs assistance Equipment used: Rolling [place]i (2 wheels) Transfers: Sit to/from Stand Sit to Stand: Min assist;From elevated surface           General transfer comment: MaxA + 2 to attempt from lower surface; MinA from elevated surface with usage of RW    Ambulation/Gait Ambulation/Gait assistance: Min assist Gait Distance (Feet): 3 Feet Assistive device: Rolling [place]et[place] (2 wheels) Gait Pattern/deviations: Step-to pattern Gait velocity: decreased     General Gait Details: Side steps at EOB for repositioning in bed. Heavy reliance on RW thorughout   Stairs             Wheelchair Mobility    Modified Rankin (Stroke Patients Only)       Balance Overall balance assessment: Needs assistance Sitting-balance support: Bilateral upper extremity supported;Feet supported Sitting balance-Leahy Scale: Fair Sitting balance - Comments: Increased abdominal pain in sitting   Standing balance support: Bilateral upper extremity supported;Reliant on assistive device for balance;During functional activity Standing balance-Leahy Scale: Poor Standing balance comment: Heavy reliance on RW for stability. Unable to take steps away from bed  Cognition Arousal/Alertness: Awake/alert Behavior During Therapy: WFL for tasks assessed/performed Overall Cognitive Status: Within Functional Limits for tasks assessed                                          Exercises Other Exercises Other Exercises: Pt educated on bracing pillow during coughing to assist with abdominal pain. Upon  entering room, pt had removed oxygen and found to be 83% SpO2. Replaced with 3L of oxygen and cued for PLB techniques to improve saturation    General Comments        Pertinent Vitals/Pain Pain Assessment: 0-10 Pain Score:  (Unable to provide number) Pain Location: stomach Pain Descriptors / Indicators: Aching;Discomfort;Grimacing Pain Intervention(s): Limited activity within patient's tolerance;Monitored during session;Repositioned;Utilized relaxation techniques;Other (comment) (Bracing with pillow for pain with coughing)    Home Living                          Prior Function            PT Goals (current goals can now be found in the care plan section) Acute Rehab PT Goals Patient Stated Goal: to return back to SNF PT Goal Formulation: With patient Time For Goal Achievement: 07/08/21 Potential to Achieve Goals: Fair Progress towards PT goals: Progressing toward goals    Frequency    Min 2X/week      PT Plan Current plan remains appropriate    Co-evaluation              AM-PAC PT "6 Clicks" Mobility   Outcome Measure  Help needed turning from your back to your side while in a flat bed without using bedrails?: A Little Help needed moving from lying on your back to sitting on the side of a flat bed without using bedrails?: A Lot Help needed moving to and from a bed to a chair (including a wheelchair)?: A Lot Help needed standing up from a chair using your arms (e.g., wheelchair or bedside chair)?: A Lot Help needed to walk in hospital room?: A Lot Help needed climbing 3-5 steps with a railing? : Total 6 Click Score: 12    End of Session Equipment Utilized During Treatment: Gait belt;Oxygen;Other (comment) (3L of oxygen) Activity Tolerance: Patient limited by fatigue;Patient limited by pain Patient left: with call bell/phone within reach;in bed;with bed alarm set Nurse Communication: Mobility status PT Visit Diagnosis: Unsteadiness on feet  (R26.81);Other abnormalities of gait and mobility (R26.89);Repeated falls (R29.6);Muscle weakness (generalized) (M62.81);Difficulty in walking, not elsewhere classified (R26.2);History of falling (Z91.81);Pain Pain - part of body:  (Abdomen)     Time: 5625-6389 PT Time Calculation (min) (ACUTE ONLY): 24 min  Charges:  $Gait Training: 8-22 mins $Therapeutic Activity: 8-22 mins                     Andrey Campanile, SPT    Andrey Campanile 06/25/2021, 4:57 PM

## 2021-06-25 NOTE — Progress Notes (Addendum)
PROGRESS NOTE    Trevor Ferguson  ERX:[id number] DOB: 26-Nov-1925 DOA: 06/21/2021 PCP: [name], MD    Brief Narrative:  This is a 87 year old male with past medical history significant for COPD hypertension, hyperlipidemia, sleep apnea, CKD stage IIIb, CVA, who presented to the emergency room with acute onset of right lower quadrant abdominal pain which has started a couple of weeks prior.  CT scan showed recurrent acute small bowel diverticulitis with small amount of mottled extra luminal gas in the mesentery consistent with microperforation there was no focal or drainable collection they also found regional small bowel ileus.  Surgery was therefore consulted and patient had exploratory laparotomy on June 22, 2021.    Patient tolerated procedure well however has persistent postoperative abdominal distention.  Remains on clear liquid diet as of 11/7.   Assessment & Plan:   Active Problems:   Acute diverticulitis  Acute small bowel diverticulitis with microperforation Presented to ED with abdominal pain nausea CT with diverticulitis and microperforation Started on empiric antibiotics on admission Surgery on consult Status post exploratory laparotomy with small bowel resection and anastomosis 11/4 Overall doing well postoperatively still with some abdominal distention Remains on clear liquid diet Plan: Continue clear liquid diet Continue empiric antibiotics Appreciate surgery follow-up  Postoperative ileus Noted on KUB 11/6 Clear liquid diet Encourage ambulation  Acute kidney injury on chronic kidney disease stage IIIb Creatinine worsening postoperatively Suspect prerenal azotemia Unable to exclude postoperative ATN Plan: Increase maintenance fluids 100 cc/h Monitor volume status carefully Check urinalysis Check renal ultrasound AM BMP  COPD No evidence of acute exacerbation Bronchodilators Pulse oximetry with vital sign checks  Essential  hypertension PTA Norvasc Hold home ACE inhibitor  Depression PTA Lexapro  GERD PPI  Chronic iron deficiency anemia PTA ferrous sulfate    DVT prophylaxis: SQ Lovenox Code Status: DNR Family Communication: Daughter Barnetta Chapel [phone] on 11/8 Disposition Plan: Status is: Inpatient  Remains inpatient appropriate because: Postop day #3 status post exploratory laparotomy.  Remains on IV antibiotics.  Worsening kidney function.  Disposition plan pending.       Level of care: Med-Surg  Consultants:  General surgery  Procedures:  Exploratory laparotomy 11/4  Antimicrobials: Cefepime Flagyl   Subjective: Patient seen and examined.  Mentating clearly.  Does endorse some abdominal distention.  Objective: Vitals:   06/24/21 2040 06/25/21 0452 06/25/21 0739 06/25/21 0808  BP: (!) 106/53 (!) 97/54  118/62  Pulse: 90 82 83 88  Resp: 20 20 18 18   Temp: 98 F (36.7 C) 97.7 F (36.5 C)  (!) 97.5 F (36.4 C)  TempSrc: Oral Oral  Oral  SpO2: 90% 93% 91% 96%  Weight:      Height:        Intake/Output Summary (Last 24 hours) at 06/25/2021 1116 Last data filed at 06/25/2021 1023 Gross per 24 hour  Intake 1050.48 ml  Output 200 ml  Net 850.48 ml   Filed Weights   06/22/21 0406 06/22/21 2134  Weight: 94.3 kg 96.2 kg    Examination:  General exam: No acute distress Respiratory system: Lung sounds decreased at bases.  Normal work of breathing.  3 L Cardiovascular system: S1-S2, RRR, no murmurs, no pedal edema Gastrointestinal system: Nondistended, mild tender to palpation, central abdomen surgical incision, CDI Central nervous system: Alert and oriented. No focal neurological deficits. Extremities: Symmetric 5 x 5 power. Skin: No rashes, lesions or ulcers Psychiatry: Judgement and insight appear normal. Mood & affect appropriate.     Data  Reviewed: I have personally reviewed following labs and imaging studies  CBC: Recent Labs  Lab 06/21/21 1621  06/22/21 0515 06/23/21 0439 06/25/21 0510  WBC 15.7* 12.2* 14.9* 11.4*  NEUTROABS  --   --   --  8.5*  HGB 10.5* 9.2* 9.5* 8.7*  HCT 31.0* 27.3* 28.4* 26.4*  MCV 93.1 91.3 93.1 96.4  PLT 233 195 188 027   Basic Metabolic Panel: Recent Labs  Lab 06/21/21 1621 06/22/21 0515 06/23/21 0439 06/25/21 0510  NA 131* 134* 134* 131*  K 4.3 4.2 4.7 4.5  CL 96* 103 105 104  CO2 25 24 21* 18*  GLUCOSE 114* 101* 160* 110*  BUN 56* 54* 53* 66*  CREATININE 2.65* 2.35* 2.51* 3.50*  CALCIUM 8.7* 8.2* 8.0* 7.8*  MG  --   --  2.2  --   PHOS  --   --  5.4*  --    GFR: Estimated Creatinine Clearance: 15 mL/min (A) (by C-G formula based on SCr of 3.5 mg/dL (H)). Liver Function Tests: Recent Labs  Lab 06/21/21 1621  AST 13*  ALT 9  ALKPHOS 55  BILITOT 0.9  PROT 7.0  ALBUMIN 4.2   Recent Labs  Lab 06/21/21 1621  LIPASE 34   No results for input(s): AMMONIA in the last 168 hours. Coagulation Profile: No results for input(s): INR, PROTIME in the last 168 hours. Cardiac Enzymes: No results for input(s): CKTOTAL, CKMB, CKMBINDEX, TROPONINI in the last 168 hours. BNP (last 3 results) No results for input(s): PROBNP in the last 8760 hours. HbA1C: No results for input(s): HGBA1C in the last 72 hours. CBG: No results for input(s): GLUCAP in the last 168 hours. Lipid Profile: No results for input(s): CHOL, HDL, LDLCALC, TRIG, CHOLHDL, LDLDIRECT in the last 72 hours. Thyroid Function Tests: No results for input(s): TSH, T4TOTAL, FREET4, T3FREE, THYROIDAB in the last 72 hours. Anemia Panel: No results for input(s): VITAMINB12, FOLATE, FERRITIN, TIBC, IRON, RETICCTPCT in the last 72 hours. Sepsis Labs: No results for input(s): PROCALCITON, LATICACIDVEN in the last 168 hours.  Recent Results (from the past 240 hour(s))  Resp Panel by RT-PCR (Flu A&B, Covid) Nasopharyngeal Swab     Status: None   Collection Time: 06/21/21  8:38 PM   Specimen: Nasopharyngeal Swab; Nasopharyngeal(NP)  swabs in vial transport medium  Result Value Ref Range Status   SARS Coronavirus 2 by RT PCR NEGATIVE NEGATIVE Final    Comment: (NOTE) SARS-CoV-2 target nucleic acids are NOT DETECTED.  The SARS-CoV-2 RNA is generally detectable in upper respiratory specimens during the acute phase of infection. The lowest concentration of SARS-CoV-2 viral copies this assay can detect is 138 copies/mL. A negative result does not preclude SARS-Cov-2 infection and should not be used as the sole basis for treatment or other patient management decisions. A negative result may occur with  improper specimen collection/handling, submission of specimen other than nasopharyngeal swab, presence of viral mutation(s) within the areas targeted by this assay, and inadequate number of viral copies(<138 copies/mL). A negative result must be combined with clinical observations, patient history, and epidemiological information. The expected result is Negative.  Fact Sheet for Patients:  EntrepreneurPulse.com.au  Fact Sheet for Healthcare Providers:  IncredibleEmployment.be  This test is no t yet approved or cleared by the [place] FDA and  has been authorized for detection and/or diagnosis of SARS-CoV-2 by FDA under an Emergency Use Authorization (EUA). This EUA will remain  in effect (meaning this test can be used) for the duration  of the COVID-19 declaration under Section 564(b)(1) of the Act, 21 U.S.C.section 360bbb-3(b)(1), unless the authorization is terminated  or revoked sooner.       Influenza A by PCR NEGATIVE NEGATIVE Final   Influenza B by PCR NEGATIVE NEGATIVE Final    Comment: (NOTE) The Xpert Xpress SARS-CoV-2/FLU/RSV plus assay is intended as an aid in the diagnosis of influenza from Nasopharyngeal swab specimens and should not be used as a sole basis for treatment. Nasal washings and aspirates are unacceptable for Xpert Xpress  SARS-CoV-2/FLU/RSV testing.  Fact Sheet for Patients: EntrepreneurPulse.com.au  Fact Sheet for Healthcare Providers: IncredibleEmployment.be  This test is not yet approved or cleared by the [place] FDA and has been authorized for detection and/or diagnosis of SARS-CoV-2 by FDA under an Emergency Use Authorization (EUA). This EUA will remain in effect (meaning this test can be used) for the duration of the COVID-19 declaration under Section 564(b)(1) of the Act, 21 U.S.C. section 360bbb-3(b)(1), unless the authorization is terminated or revoked.  Performed at [facility], [address] [place], [place] [address]          Radiology Studies: CT HEAD WO CONTRAST (5MM)  Result Date: 06/23/2021 CLINICAL DATA:  Unwitnessed fall. EXAM: CT HEAD WITHOUT CONTRAST TECHNIQUE: Contiguous axial images were obtained from the base of the skull through the vertex without intravenous contrast. COMPARISON:  Head CT 03/07/2019 FINDINGS: Brain: Age related atrophy and chronic small vessel ischemia. No intracranial hemorrhage, mass effect, or midline shift. No hydrocephalus. The basilar cisterns are patent. No evidence of territorial infarct or acute ischemia. No extra-axial or intracranial fluid collection. Vascular: Atherosclerosis of skullbase vasculature without hyperdense vessel or abnormal calcification. Skull: No fracture or focal lesion. Sinuses/Orbits: Chronic opacification of right ethmoid air cells. Trace mucosal thickening in left side of sphenoid sinus. Bilateral cataract resection Other: No confluent scalp hematoma. IMPRESSION: 1. No acute intracranial abnormality. No skull fracture. 2. Age related atrophy and chronic small vessel ischemia. Electronically Signed   By: Keith Rake M.D.   On: 06/23/2021 22:35   US RENAL  Result Date: 06/25/2021 CLINICAL DATA:  Acute kidney insufficiency EXAM: RENAL / URINARY TRACT ULTRASOUND COMPLETE  COMPARISON:  CT abdomen 06/21/2021 FINDINGS: Right Kidney: Renal measurements: 10.7 x 5.1 x 5.5 cm = volume: 158 mL. Increased echogenicity with cortical thinning. 10 mm nonobstructing calculus in the upper pole. No hydronephrosis. Left Kidney: Renal measurements: 13.4 x 6 x 5.6 cm = volume: 236 mL. Increased echogenicity with cortical thinning in the upper and midpole. Anechoic cysts measuring up to 1.7 cm in the upper pole. No nephrolithiasis or hydronephrosis identified. Bladder: Appears normal for degree of bladder distention. Other: None. IMPRESSION: 1. Evidence of medical renal disease. 2. Right renal calculus. 3. Left renal cysts. Electronically Signed   By: Ofilia Neas M.D.   On: 06/25/2021 11:05   DG Abd 2 Views  Result Date: 06/24/2021 CLINICAL DATA:  Abdominal distension EXAM: ABDOMEN - 2 VIEW COMPARISON:  06/21/2021 FINDINGS: Scattered large and small bowel gas is noted. No significant dilatation is noted. Postsurgical changes are seen consistent with recent exploratory laparotomy. These findings are likely related to a postoperative ileus. No definitive free air is seen. No acute bony abnormality is noted. Prostate therapy seeds are seen. IMPRESSION: Changes consistent with mild postoperative ileus. Electronically Signed   By: Inez Catalina M.D.   On: 06/24/2021 21:26        Scheduled Meds:  acidophilus  2 capsule Oral TID   amLODipine  5 mg Oral Daily   budesonide (PULMICORT) nebulizer solution  0.5 mg Nebulization Daily   cholecalciferol  1,000 Units Oral Daily   enoxaparin (LOVENOX) injection  40 mg Subcutaneous Q24H   escitalopram  10 mg Oral Daily   famotidine  10 mg Oral BID   ferrous sulfate  325 mg Oral Q breakfast   mouth rinse  15 mL Mouth Rinse BID   traZODone  150 mg Oral QHS   Continuous Infusions:  sodium chloride 100 mL/hr at 06/25/21 1026   ceFEPime (MAXIPIME) IV 2 g (06/24/21 2336)   metronidazole 500 mg (06/25/21 0601)     LOS: 4 days    Time spent:  35 minutes    Sidney Ace, MD [facility]   If 7PM-7AM, please contact night-coverage  06/25/2021, 11:16 AM

## 2021-06-25 NOTE — Care Management Important Message (Signed)
Important Message  Patient Details  Name: Trevor Ferguson MRN: [id number] Date of Birth: 31-Aug-1925   Medicare Important Message Given:  Yes     Dannette Barbara 06/25/2021, 4:09 PM

## 2021-06-25 NOTE — TOC Progression Note (Signed)
Transition of Care [facility]) - Progression Note    Patient Details  Name: Trevor Ferguson MRN: [id number] Date of Birth: 1926-03-21  Transition of Care [facility]) CM/SW Contact  Beverly Sessions, RN Phone Number: 06/25/2021, 1:06 PM  Clinical Narrative:     Not medically stable to start auth  Expected Discharge Plan: Long Term Nursing Home Barriers to Discharge: Continued Medical Work up  Expected Discharge Plan and Services Expected Discharge Plan: [place]       Living arrangements for the past 2 months: [place]                                       Social Determinants of Health (SDOH) Interventions    Readmission Risk Interventions Readmission Risk Prevention Plan 06/23/2021 01/29/2021  Transportation Screening Complete Complete  PCP or Specialist Appt within 3-5 Days Complete Complete  HRI or Home Care Consult Complete Complete  Social Work Consult for [place] Planning/Counseling Complete Complete  Palliative Care Screening Not Applicable Not Applicable  Medication Review Press photographer) Complete Complete  Some recent data might be hidden

## 2021-06-26 ENCOUNTER — Inpatient Hospital Stay: Payer: Medicare PPO

## 2021-06-26 DIAGNOSIS — K5792 Diverticulitis of intestine, part unspecified, without perforation or abscess without bleeding: Secondary | ICD-10-CM | POA: Diagnosis not present

## 2021-06-26 LAB — CBC WITH DIFFERENTIAL/PLATELET
Abs Immature Granulocytes: 0.33 10*3/uL — ABNORMAL HIGH (ref 0.00–0.07)
Abs Immature Granulocytes: 0.42 10*3/uL — ABNORMAL HIGH (ref 0.00–0.07)
Basophils Absolute: 0.1 10*3/uL (ref 0.0–0.1)
Basophils Absolute: 0.1 10*3/uL (ref 0.0–0.1)
Basophils Relative: 1 %
Basophils Relative: 1 %
Eosinophils Absolute: 0.1 10*3/uL (ref 0.0–0.5)
Eosinophils Absolute: 0.1 10*3/uL (ref 0.0–0.5)
Eosinophils Relative: 1 %
Eosinophils Relative: 1 %
HCT: 27.9 % — ABNORMAL LOW (ref 39.0–52.0)
HCT: 28.9 % — ABNORMAL LOW (ref 39.0–52.0)
Hemoglobin: 8.9 g/dL — ABNORMAL LOW (ref 13.0–17.0)
Hemoglobin: 9.3 g/dL — ABNORMAL LOW (ref 13.0–17.0)
Immature Granulocytes: 3 %
Immature Granulocytes: 3 %
Lymphocytes Relative: 5 %
Lymphocytes Relative: 4 %
Lymphs Abs: 0.7 10*3/uL (ref 0.7–4.0)
Lymphs Abs: 0.6 10*3/uL — ABNORMAL LOW (ref 0.7–4.0)
MCH: 30.7 pg (ref 26.0–34.0)
MCH: 30.9 pg (ref 26.0–34.0)
MCHC: 31.9 g/dL (ref 30.0–36.0)
MCHC: 32.2 g/dL (ref 30.0–36.0)
MCV: 96.2 fL (ref 80.0–100.0)
MCV: 96 fL (ref 80.0–100.0)
Monocytes Absolute: 1.9 10*3/uL — ABNORMAL HIGH (ref 0.1–1.0)
Monocytes Absolute: 2.1 10*3/uL — ABNORMAL HIGH (ref 0.1–1.0)
Monocytes Relative: 15 %
Monocytes Relative: 15 %
Neutro Abs: 9.2 10*3/uL — ABNORMAL HIGH (ref 1.7–7.7)
Neutro Abs: 11.2 10*3/uL — ABNORMAL HIGH (ref 1.7–7.7)
Neutrophils Relative %: 75 %
Neutrophils Relative %: 76 %
Platelets: 206 10*3/uL (ref 150–400)
Platelets: 231 10*3/uL (ref 150–400)
RBC: 2.9 MIL/uL — ABNORMAL LOW (ref 4.22–5.81)
RBC: 3.01 MIL/uL — ABNORMAL LOW (ref 4.22–5.81)
RDW: 15.4 % (ref 11.5–15.5)
RDW: 15.5 % (ref 11.5–15.5)
WBC: 12.2 10*3/uL — ABNORMAL HIGH (ref 4.0–10.5)
WBC: 14.6 10*3/uL — ABNORMAL HIGH (ref 4.0–10.5)
nRBC: 0 % (ref 0.0–0.2)
nRBC: 0 % (ref 0.0–0.2)

## 2021-06-26 LAB — URINALYSIS, COMPLETE (UACMP) WITH MICROSCOPIC
Bilirubin Urine: NEGATIVE
Glucose, UA: NEGATIVE mg/dL
Hgb urine dipstick: NEGATIVE
Ketones, ur: 5 mg/dL — AB
Nitrite: NEGATIVE
Protein, ur: 30 mg/dL — AB
Specific Gravity, Urine: 1.018 (ref 1.005–1.030)
pH: 5 (ref 5.0–8.0)

## 2021-06-26 LAB — BASIC METABOLIC PANEL
Anion gap: 9 (ref 5–15)
Anion gap: 9 (ref 5–15)
BUN: 59 mg/dL — ABNORMAL HIGH (ref 8–23)
BUN: 62 mg/dL — ABNORMAL HIGH (ref 8–23)
CO2: 16 mmol/L — ABNORMAL LOW (ref 22–32)
CO2: 17 mmol/L — ABNORMAL LOW (ref 22–32)
Calcium: 7.7 mg/dL — ABNORMAL LOW (ref 8.9–10.3)
Calcium: 7.9 mg/dL — ABNORMAL LOW (ref 8.9–10.3)
Chloride: 105 mmol/L (ref 98–111)
Chloride: 104 mmol/L (ref 98–111)
Creatinine, Ser: 3.49 mg/dL — ABNORMAL HIGH (ref 0.61–1.24)
Creatinine, Ser: 3.75 mg/dL — ABNORMAL HIGH (ref 0.61–1.24)
GFR, Estimated: 16 mL/min — ABNORMAL LOW (ref >60)
GFR, Estimated: 14 mL/min — ABNORMAL LOW (ref >60)
Glucose, Bld: 107 mg/dL — ABNORMAL HIGH (ref 70–99)
Glucose, Bld: 109 mg/dL — ABNORMAL HIGH (ref 70–99)
Potassium: 4.5 mmol/L (ref 3.5–5.1)
Potassium: 4.8 mmol/L (ref 3.5–5.1)
Sodium: 130 mmol/L — ABNORMAL LOW (ref 135–145)
Sodium: 130 mmol/L — ABNORMAL LOW (ref 135–145)

## 2021-06-26 LAB — BLOOD GAS, ARTERIAL
Acid-base deficit: 12.5 mmol/L — ABNORMAL HIGH (ref 0.0–2.0)
Bicarbonate: 15.7 mmol/L — ABNORMAL LOW (ref 20.0–28.0)
Expiratory PAP: 5
FIO2: 0.36
Inspiratory PAP: 12
O2 Saturation: 87.1 %
Patient temperature: 37
pCO2 arterial: 45 mmHg (ref 32.0–48.0)
pH, Arterial: 7.15 — CL (ref 7.350–7.450)
pO2, Arterial: 69 mmHg — ABNORMAL LOW (ref 83.0–108.0)

## 2021-06-26 LAB — LACTIC ACID, PLASMA: Lactic Acid, Venous: 0.8 mmol/L (ref 0.5–1.9)

## 2021-06-26 LAB — SURGICAL PATHOLOGY

## 2021-06-26 MED ORDER — SODIUM CHLORIDE 0.9 % IV SOLN: INTRAVENOUS | Status: DC | PRN

## 2021-06-26 MED ORDER — ADULT MULTIVITAMIN W/MINERALS CH
1.0000 | ORAL_TABLET | Freq: Every day | ORAL | Status: DC
  Administered 2021-06-27 – 2021-07-18 (×22): 1 via ORAL
  Filled 2021-06-26 (×22): qty 1

## 2021-06-26 MED ORDER — SODIUM BICARBONATE 8.4 % IV SOLN
INTRAVENOUS | Status: DC
  Filled 2021-06-26: qty 1000
  Filled 2021-06-26: qty 150
  Filled 2021-06-26 (×2): qty 1000
  Filled 2021-06-26 (×2): qty 150

## 2021-06-26 MED ORDER — LACTATED RINGERS IV BOLUS
1000.0000 mL | Freq: Once | INTRAVENOUS | Status: AC
  Administered 2021-06-26: 1000 mL via INTRAVENOUS

## 2021-06-26 MED ORDER — HEPARIN SODIUM (PORCINE) 5000 UNIT/ML IJ SOLN
5000.0000 [IU] | Freq: Three times a day (TID) | INTRAMUSCULAR | Status: DC
  Administered 2021-06-27 – 2021-07-18 (×65): 5000 [IU] via SUBCUTANEOUS
  Filled 2021-06-26 (×64): qty 1

## 2021-06-26 MED ORDER — BOOST / RESOURCE BREEZE PO LIQD CUSTOM
1.0000 | Freq: Three times a day (TID) | ORAL | Status: DC
  Administered 2021-06-26 – 2021-06-27 (×2): 1 via ORAL

## 2021-06-26 MED ORDER — CHLORHEXIDINE GLUCONATE CLOTH 2 % EX PADS
6.0000 | MEDICATED_PAD | Freq: Every day | CUTANEOUS | Status: DC
  Administered 2021-06-27 – 2021-07-07 (×10): 6 via TOPICAL

## 2021-06-26 MED ORDER — LACTATED RINGERS IV SOLN: INTRAVENOUS | Status: DC

## 2021-06-26 MED ORDER — PIPERACILLIN-TAZOBACTAM IN DEX 2-0.25 GM/50ML IV SOLN
2.2500 g | Freq: Four times a day (QID) | INTRAVENOUS | Status: DC
  Administered 2021-06-26 – 2021-06-28 (×7): 2.25 g via INTRAVENOUS
  Filled 2021-06-26 (×9): qty 50

## 2021-06-26 NOTE — Progress Notes (Signed)
Initial Nutrition Assessment  DOCUMENTATION CODES:   Obesity unspecified  INTERVENTION:   Recommend TPN if unable to advance pt's diet in the next 24 hours   Boost Breeze po TID, each supplement provides 250 kcal and 9 grams of protein  MVI po daily   Pt at high refeed risk; recommend monitor potassium, magnesium and phosphorus labs daily until stable  NUTRITION DIAGNOSIS:   Inadequate oral intake related to acute illness as evidenced by other (comment) (pt on clear liquid diet).  GOAL:   Patient will meet greater than or equal to 90% of their needs  MONITOR:   PO intake, Supplement acceptance, Labs, Weight trends, Skin, I & O's, Diet advancement  REASON FOR ASSESSMENT:   NPO/Clear Liquid Diet    ASSESSMENT:   87 y/o male with h/o COPD, hypertension, prostate cancer, stage IIIb chronic kidney disease, CVA and sleep apnea who is admitted with peforated diverticulits now s/p ex lap with small bowel resection with removal of 33cm of jejunum on 77/4 complicated by post op ileus.  Met with pt in room today. Pt currently on bipap and in the process of being transferred to the progressive care unit. Pt unable to provide any nutrition related history at this time. RN at bedside reports pt with poor oral intake; pt has not eaten anything today. Per chart review, pt has remained on a NPO/clear liquid diet since admission and is now without adequate nutrition for    > 5 days. KUB from 11/6 reports mild ileus. Plan is for abdominal CT scan today. No BM noted since yesterday. RD will add supplements and MVI to help pt meet his estimated needs and to support post-op healing. Pt is at high refeed risk. Recommend consideration of TPN by tomorrow if unable to advance pt's diet. Per chart, pt is up ~20lbs since admit; pt +6.3L on his I & Os.   Medications reviewed and include: risaquad, D3, pepcid, ferrous sulfate, heparin, MVI, cefepime, metronidazole    Labs reviewed: Na 130(L), K 4.5 wnl,  BUN 59(H), creat 3.49(H) P 5.4(H), Mg 2.2 wnl- 11/5 Wbc- 12.2(H), Hgb 8.9(L), Hct 27.9(L)  NUTRITION - FOCUSED PHYSICAL EXAM:  Flowsheet Row Most Recent Value  Orbital Region No depletion  Upper Arm Region No depletion  Thoracic and Lumbar Region No depletion  Buccal Region No depletion  Temple Region No depletion  Clavicle Bone Region Mild depletion  Clavicle and Acromion Bone Region Mild depletion  Scapular Bone Region No depletion  Dorsal Hand No depletion  Patellar Region No depletion  Anterior Thigh Region No depletion  Posterior Calf Region No depletion  Edema (RD Assessment) Mild  Hair Reviewed  Eyes Reviewed  Mouth Reviewed  Skin Reviewed  Nails Reviewed   Diet Order:   Diet Order             Diet clear liquid Room service appropriate? Yes; Fluid consistency: Thin  Diet effective now                  EDUCATION NEEDS:   Not appropriate for education at this time  Skin:  Skin Assessment: Reviewed RN Assessment (Incision abdomen)  Last BM:  11/7- TYPE 7  Height:   Ht Readings from Last 1 Encounters:  06/22/21 5' 8.5" (1.74 m)    Weight:   Wt Readings from Last 1 Encounters:  06/22/21 96.2 kg    Ideal Body Weight:  71.36 kg  BMI:  Body mass index is 31.78 kg/m.  Estimated Nutritional Needs:  Kcal:  1900-2200kcal/day  Protein:  95-110g/day  Fluid:  1.8-2.1L/day  Koleen Distance MS, RD, LDN Please refer to [facility] for RD and/or RD on-call/weekend/after hours pager

## 2021-06-26 NOTE — TOC Progression Note (Signed)
Transition of Care [facility]) - Progression Note    Patient Details  Name: Trevor Ferguson MRN: [id number] Date of Birth: 1926-03-15  Transition of Care [facility]) CM/SW Contact  Beverly Sessions, RN Phone Number: 06/26/2021, 10:23 AM  Clinical Narrative:     Seth Bake at [facility] updated that patient is not medically ready to start auth  Expected Discharge Plan: Long Term Nursing Home Barriers to Discharge: Continued Medical Work up  Expected Discharge Plan and Services Expected Discharge Plan: [place]       Living arrangements for the past 2 months: [place]                                       Social Determinants of Health (SDOH) Interventions    Readmission Risk Interventions Readmission Risk Prevention Plan 06/23/2021 01/29/2021  Transportation Screening Complete Complete  PCP or Specialist Appt within 3-5 Days Complete Complete  HRI or [place] Complete Complete  Social Work Consult for [place] Planning/Counseling Complete Complete  Palliative Care Screening Not Applicable Not Applicable  Medication Review Press photographer) Complete Complete  Some recent data might be hidden

## 2021-06-26 NOTE — Progress Notes (Signed)
Patient ID: Trevor Ferguson, male   DOB: [birth year], 87 y.o.   MRN: [id number]  Surgery Follow Up Note  Patient was reevaluated.  He continues to be lethargic.  He is on BiPAP and tolerating well.  I have reviewed the new labs that shows metabolic acidosis.  Lactic acid 0.8.  There is worsening renal function of two 3.75.  Bicarb 17.  CT scan of the abdomen and pelvis shows no free air or free fluid.  There is no pneumoperitoneum or any gas around the anastomosis.  I personally evaluated the images and discussed it with the radiologist and there is no sign of postop complication.  There is not even ileus that was seen on previous abdominal x-ray.  There is adequate gas and liquid in the large intestine.  I discussed the case with hospitalist.  We rule out intra-abdominal cause of these clinical deterioration.  We discussed about starting patient on bicarb drip.  Possible cause of metabolic acidosis is to worsening renal function.  EXAM: CT ABDOMEN AND PELVIS WITHOUT CONTRAST   TECHNIQUE: Multidetector CT imaging of the abdomen and pelvis was performed following the standard protocol without IV contrast.   COMPARISON:  06/21/2021   FINDINGS: Lower chest: Minimal dependent atelectasis or infiltrate in both lungs with tiny right effusion.   Hepatobiliary: No focal abnormality in the liver on this study without intravenous contrast. Gallbladder is markedly distended. No intrahepatic or extrahepatic biliary dilation.   Pancreas: No focal mass lesion. No dilatation of the main duct. No intraparenchymal cyst. No peripancreatic edema.   Spleen: No splenomegaly. No focal mass lesion.   Adrenals/Urinary Tract: No adrenal nodule or mass. Right kidney unremarkable. 2 cm low-density lesion interpolar left kidney is stable in the interval, likely a cyst. No evidence for hydroureter. The urinary bladder appears normal for the degree of distention.   Stomach/Bowel: Stomach is unremarkable. No  gastric wall thickening. No evidence of outlet obstruction. Duodenum is normally positioned as is the ligament of Treitz. Duodenal diverticulum noted. No small bowel wall thickening. No small bowel dilatation. Left abdominal anastomosis is unremarkable without evidence of wall thickening, pneumatosis, or adjacent fluid/extraluminal gas. No small bowel dilatation. The terminal ileum is normal. Right colon is mildly distended with gas and fluid. Left colon is nondilated. Diverticular change noted in the left colon without diverticulitis.   Vascular/Lymphatic: There is moderate atherosclerotic calcification of the abdominal aorta without aneurysm. There is no gastrohepatic or hepatoduodenal ligament lymphadenopathy. No retroperitoneal or mesenteric lymphadenopathy. No pelvic sidewall lymphadenopathy.   Reproductive: Brachytherapy seeds noted in the prostate gland.   Other: No intraperitoneal free fluid.  No intraperitoneal free air.   Musculoskeletal: Diffuse body wall edema. No worrisome lytic or sclerotic osseous abnormality.   IMPRESSION: 1. Left abdominal small bowel anastomosis is unremarkable. No evidence for wall thickening, pneumatosis, or adjacent fluid/extraluminal gas. No free fluid in the peritoneal cavity. No small bowel dilatation. 2. Marked gallbladder distension. 3. Right colon is mildly distended with gas and fluid. Imaging features are nonspecific. 4. Left colonic diverticulosis without diverticulitis. 5. Diffuse body wall edema. 6. Aortic Atherosclerosis (ICD10-I70.0).     Electronically Signed   By: Misty Stanley M.D.   On: 06/26/2021 18:28   I updated the daughter Virginia Crews.  She understood she understood patient clinical status.  She will update patient's wife.  I will continue to follow closely.  Appreciate hospitalist management of medical comorbidities.  Herbert Pun, MD, FACS

## 2021-06-26 NOTE — Progress Notes (Signed)
MD made aware that patient is a little more difficult to arouse and more confused. Patient received oxy this AM. VSS, will continue to monitor

## 2021-06-26 NOTE — Progress Notes (Addendum)
Patient was assessed upon arrival at around 1700 on the unit. Skin assessment done. Sacral foam placed on bottom. Skin is red in sacral area but blanchable.   Patient was drowsy but was more oriented about an hour later. He remains on BiPap. Respiratory therapist and I took him down for CT scan and Head scan at around 6pm. He was more alert then.  Respiratory therapist mentioned reducing his FiO2 to 24% from 28% as he maintained his Oxygen saturation in 95-98%.   A bolus of Lactate Ringers was administered upon admission to the unit. Doctor has put in another order (which he will possibly hold).   Antibiotics Zoscyn to be administered once received from Pharmacy.  Doctor has also ordered BiCarb drip and a foley.

## 2021-06-26 NOTE — Progress Notes (Signed)
PROGRESS NOTE    Trevor Ferguson  TIW:[id number] DOB: [birth year] DOA: 06/21/2021 PCP: [name], MD    Brief Narrative:  This is a 87 year old male with past medical history significant for COPD hypertension, hyperlipidemia, sleep apnea, CKD stage IIIb, CVA, who presented to the emergency room with acute onset of right lower quadrant abdominal pain which has started a couple of weeks prior.  CT scan showed recurrent acute small bowel diverticulitis with small amount of mottled extra luminal gas in the mesentery consistent with microperforation there was no focal or drainable collection they also found regional small bowel ileus.  Surgery was therefore consulted and patient had exploratory laparotomy on June 22, 2021.    Patient tolerated procedure well however has persistent postoperative abdominal distention.  Remains on clear liquid diet as of 11/7. 11/8: Patient more sleepy this morning.  Reports he does not feel well but is unable to elucidate why.  Having bowel movements.  Denies nausea and vomiting.  On 3 L.  Chest x-ray with evolving opacity, pneumonia versus atelectasis   Assessment & Plan:   Active Problems:   Acute diverticulitis  Acute small bowel diverticulitis with microperforation Presented to ED with abdominal pain nausea CT with diverticulitis and microperforation Started on empiric antibiotics on admission Surgery on consult Status post exploratory laparotomy with small bowel resection and anastomosis 11/4 Overall doing well postoperatively still with some abdominal distention Remains on clear liquid diet 11/8: Patient more lethargic and states he does not feel well Plan: Continue clear liquid diet Continue empiric antibiotics Appreciate surgery follow-up Surgery to evaluate this afternoon regarding advancement of diet however at this time likely not safe to advance past clear liquids.  Defer to general surgery inpatient progression  Lethargy Unclear  etiology Noted on exam 11/8 Could be secondary to opiate administration Plan: Avoid nonessential sedatives Monitor mental status carefully Vitals per unit protocol   Postoperative ileus Noted on KUB 11/6 Clear liquid diet Encourage ambulation  Acute kidney injury on chronic kidney disease stage IIIb Creatinine worsening postoperatively Suspect prerenal azotemia Unable to exclude postoperative ATN No contrast exposure Renal ultrasound medical renal disease, no hydronephrosis Plan: NS at 75 cc/h Monitor volume status carefully Check urinalysis, ordered and pending AM BMP  COPD No evidence of acute exacerbation Bronchodilators Pulse oximetry with vital sign checks  Essential hypertension PTA Norvasc Hold home ACE inhibitor  Depression PTA Lexapro  GERD PPI  Chronic iron deficiency anemia PTA ferrous sulfate    DVT prophylaxis: SQ Lovenox Code Status: DNR Family Communication: Daughter Barnetta Chapel [phone] on 11/7 Disposition Plan: Status is: Inpatient  Remains inpatient appropriate because: Postop day #4 status post exploratory laparotomy.  Remains on IV antibiotics.  Kidney function remains abnormal.  Patient more lethargic this morning.       Level of care: Med-Surg  Consultants:  General surgery  Procedures:  Exploratory laparotomy 11/4  Antimicrobials: Cefepime Flagyl   Subjective: Patient seen and examined.  More lethargic this morning states he does not feel well but unable to clarify.  Objective: Vitals:   06/26/21 0457 06/26/21 0734 06/26/21 0802 06/26/21 1149  BP: (!) 106/57  (!) 110/59 (!) 123/59  Pulse: 85  88 79  Resp: 20  17 18   Temp: 97.8 F (36.6 C)  97.9 F (36.6 C)   TempSrc: Oral  Oral   SpO2: 91% 91% 94% 100%  Weight:      Height:        Intake/Output Summary (Last 24 hours) at 06/26/2021  [place] filed at 06/26/2021 1000 Gross per 24 hour  Intake 3345.97 ml  Output 200 ml  Net 3145.97 ml   Filed  Weights   06/22/21 0406 06/22/21 2134  Weight: 94.3 kg 96.2 kg    Examination:  General exam: Poor lethargic this a.m. Respiratory system: Scattered crackles bilaterally.  Normal work of breathing.  3 L Cardiovascular system: S1-S2, RRR, no murmurs, no pedal edema Gastrointestinal system: Distended mildly tender to palpation, central abdominal and surgical incision, CDI Central nervous system: Alert and oriented. No focal neurological deficits. Extremities: Symmetric 5 x 5 power. Skin: No rashes, lesions or ulcers Psychiatry: Judgement and insight appear normal. Mood & affect appropriate.     Data Reviewed: I have personally reviewed following labs and imaging studies  CBC: Recent Labs  Lab 06/21/21 1621 06/22/21 0515 06/23/21 0439 06/25/21 0510 06/26/21 0429  WBC 15.7* 12.2* 14.9* 11.4* 12.2*  NEUTROABS  --   --   --  8.5* 9.2*  HGB 10.5* 9.2* 9.5* 8.7* 8.9*  HCT 31.0* 27.3* 28.4* 26.4* 27.9*  MCV 93.1 91.3 93.1 96.4 96.2  PLT 233 195 188 211 128   Basic Metabolic Panel: Recent Labs  Lab 06/21/21 1621 06/22/21 0515 06/23/21 0439 06/25/21 0510 06/26/21 0429  NA 131* 134* 134* 131* 130*  K 4.3 4.2 4.7 4.5 4.5  CL 96* 103 105 104 105  CO2 25 24 21* 18* 16*  GLUCOSE 114* 101* 160* 110* 107*  BUN 56* 54* 53* 66* 59*  CREATININE 2.65* 2.35* 2.51* 3.50* 3.49*  CALCIUM 8.7* 8.2* 8.0* 7.8* 7.7*  MG  --   --  2.2  --   --   PHOS  --   --  5.4*  --   --    GFR: Estimated Creatinine Clearance: 15 mL/min (A) (by C-G formula based on SCr of 3.49 mg/dL (H)). Liver Function Tests: Recent Labs  Lab 06/21/21 1621  AST 13*  ALT 9  ALKPHOS 55  BILITOT 0.9  PROT 7.0  ALBUMIN 4.2   Recent Labs  Lab 06/21/21 1621  LIPASE 34   No results for input(s): AMMONIA in the last 168 hours. Coagulation Profile: No results for input(s): INR, PROTIME in the last 168 hours. Cardiac Enzymes: No results for input(s): CKTOTAL, CKMB, CKMBINDEX, TROPONINI in the last 168  hours. BNP (last 3 results) No results for input(s): PROBNP in the last 8760 hours. HbA1C: No results for input(s): HGBA1C in the last 72 hours. CBG: No results for input(s): GLUCAP in the last 168 hours. Lipid Profile: No results for input(s): CHOL, HDL, LDLCALC, TRIG, CHOLHDL, LDLDIRECT in the last 72 hours. Thyroid Function Tests: No results for input(s): TSH, T4TOTAL, FREET4, T3FREE, THYROIDAB in the last 72 hours. Anemia Panel: No results for input(s): VITAMINB12, FOLATE, FERRITIN, TIBC, IRON, RETICCTPCT in the last 72 hours. Sepsis Labs: No results for input(s): PROCALCITON, LATICACIDVEN in the last 168 hours.  Recent Results (from the past 240 hour(s))  Resp Panel by RT-PCR (Flu A&B, Covid) Nasopharyngeal Swab     Status: None   Collection Time: 06/21/21  8:38 PM   Specimen: Nasopharyngeal Swab; Nasopharyngeal(NP) swabs in vial transport medium  Result Value Ref Range Status   SARS Coronavirus 2 by RT PCR NEGATIVE NEGATIVE Final    Comment: (NOTE) SARS-CoV-2 target nucleic acids are NOT DETECTED.  The SARS-CoV-2 RNA is generally detectable in upper respiratory specimens during the acute phase of infection. The lowest concentration of SARS-CoV-2 viral copies this assay can detect  is 138 copies/mL. A negative result does not preclude SARS-Cov-2 infection and should not be used as the sole basis for treatment or other patient management decisions. A negative result may occur with  improper specimen collection/handling, submission of specimen other than nasopharyngeal swab, presence of viral mutation(s) within the areas targeted by this assay, and inadequate number of viral copies(<138 copies/mL). A negative result must be combined with clinical observations, patient history, and epidemiological information. The expected result is Negative.  Fact Sheet for Patients:  EntrepreneurPulse.com.au  Fact Sheet for Healthcare Providers:   IncredibleEmployment.be  This test is no t yet approved or cleared by the [place] FDA and  has been authorized for detection and/or diagnosis of SARS-CoV-2 by FDA under an Emergency Use Authorization (EUA). This EUA will remain  in effect (meaning this test can be used) for the duration of the COVID-19 declaration under Section 564(b)(1) of the Act, 21 U.S.C.section 360bbb-3(b)(1), unless the authorization is terminated  or revoked sooner.       Influenza A by PCR NEGATIVE NEGATIVE Final   Influenza B by PCR NEGATIVE NEGATIVE Final    Comment: (NOTE) The Xpert Xpress SARS-CoV-2/FLU/RSV plus assay is intended as an aid in the diagnosis of influenza from Nasopharyngeal swab specimens and should not be used as a sole basis for treatment. Nasal washings and aspirates are unacceptable for Xpert Xpress SARS-CoV-2/FLU/RSV testing.  Fact Sheet for Patients: EntrepreneurPulse.com.au  Fact Sheet for Healthcare Providers: IncredibleEmployment.be  This test is not yet approved or cleared by the [place] FDA and has been authorized for detection and/or diagnosis of SARS-CoV-2 by FDA under an Emergency Use Authorization (EUA). This EUA will remain in effect (meaning this test can be used) for the duration of the COVID-19 declaration under Section 564(b)(1) of the Act, 21 U.S.C. section 360bbb-3(b)(1), unless the authorization is terminated or revoked.  Performed at [facility], [address] [place], [place] [address]          Radiology Studies: US RENAL  Result Date: 06/25/2021 CLINICAL DATA:  Acute kidney insufficiency EXAM: RENAL / URINARY TRACT ULTRASOUND COMPLETE COMPARISON:  CT abdomen 06/21/2021 FINDINGS: Right Kidney: Renal measurements: 10.7 x 5.1 x 5.5 cm = volume: 158 mL. Increased echogenicity with cortical thinning. 10 mm nonobstructing calculus in the upper pole. No hydronephrosis. Left  Kidney: Renal measurements: 13.4 x 6 x 5.6 cm = volume: 236 mL. Increased echogenicity with cortical thinning in the upper and midpole. Anechoic cysts measuring up to 1.7 cm in the upper pole. No nephrolithiasis or hydronephrosis identified. Bladder: Appears normal for degree of bladder distention. Other: None. IMPRESSION: 1. Evidence of medical renal disease. 2. Right renal calculus. 3. Left renal cysts. Electronically Signed   By: Ofilia Neas M.D.   On: 06/25/2021 11:05   DG Chest Port 1 View  Result Date: 06/26/2021 CLINICAL DATA:  Hypoxia. EXAM: PORTABLE CHEST 1 VIEW COMPARISON:  Prior chest radiographs 03/06/2020 and earlier. FINDINGS: Cardiomegaly, unchanged. Subtle ill-defined opacity within the right mid-lung. No appreciable airspace consolidation within the left lung. No evidence of pleural effusion or pneumothorax. No acute bony abnormality identified. Degenerative changes of the spine. IMPRESSION: Subtle ill-defined opacity within the right mid-lung, which may reflect scarring, atelectasis or early pneumonia. Clinical correlation is recommended. Additionally, short-interval radiographic follow-up is recommended. Cardiomegaly, unchanged. Electronically Signed   By: Kellie Simmering D.O.   On: 06/26/2021 11:09   DG Abd 2 Views  Result Date: 06/24/2021 CLINICAL DATA:  Abdominal distension EXAM: ABDOMEN -  2 VIEW COMPARISON:  06/21/2021 FINDINGS: Scattered large and small bowel gas is noted. No significant dilatation is noted. Postsurgical changes are seen consistent with recent exploratory laparotomy. These findings are likely related to a postoperative ileus. No definitive free air is seen. No acute bony abnormality is noted. Prostate therapy seeds are seen. IMPRESSION: Changes consistent with mild postoperative ileus. Electronically Signed   By: Inez Catalina M.D.   On: 06/24/2021 21:26        Scheduled Meds:  acidophilus  2 capsule Oral TID   amLODipine  5 mg Oral Daily   benzonatate  200  mg Oral TID   budesonide (PULMICORT) nebulizer solution  0.5 mg Nebulization Daily   chlorpheniramine-HYDROcodone  5 mL Oral Q12H   cholecalciferol  1,000 Units Oral Daily   escitalopram  10 mg Oral Daily   famotidine  10 mg Oral BID   ferrous sulfate  325 mg Oral Q breakfast   [START ON 06/27/2021] heparin injection (subcutaneous)  5,000 Units Subcutaneous Q8H   mouth rinse  15 mL Mouth Rinse BID   traZODone  150 mg Oral QHS   Continuous Infusions:  ceFEPime (MAXIPIME) IV Stopped (06/25/21 2310)   metronidazole 500 mg (06/26/21 0541)     LOS: 5 days    Time spent: 35 minutes    Sidney Ace, MD [facility]   If 7PM-7AM, please contact night-coverage  06/26/2021, 12:42 PM

## 2021-06-26 NOTE — Progress Notes (Signed)
Patient placed on Bipap by respiratory. MD at bedside to assess.  Orders received. Holding IV fluids at this time.

## 2021-06-26 NOTE — Progress Notes (Signed)
PT Cancellation Note  Patient Details Name: Trevor Ferguson MRN: [id number] DOB: Jan 29, 1926   Cancelled Treatment:    Reason Eval/Treat Not Completed: Fatigue/lethargy limiting ability to participate  Attempted patient x 2 today. First attempt, pt very lethargic and unable to open eyes even with sternal rub. Second attempt, per RN, pt placed on bipap and inappropriate to mobilize at this time. Will re-attempt tomorrow as patient more appropriate.  Andrey Campanile, SPT  Andrey Campanile 06/26/2021, 2:01 PM

## 2021-06-26 NOTE — Progress Notes (Signed)
Patient taken to 2A for monitoring bipap.

## 2021-06-26 NOTE — Progress Notes (Signed)
Patient ID: Trevor Ferguson, male   DOB: [birth year], 87 y.o.   MRN: [id number]     [facility] Day(s): 5.   Interval History: Patient seen and examined today at 7 AM, no acute events or new complaints overnight. Patient reports not feeling well.  Upon asking why he was not feeling well he is said I do not feel well.  He denies any nausea or vomiting.  Abdominal pain is upon coughing.  There has been no fever and no tachycardia.  Patient was woken up from sleep.  Maybe he was too uncomfortable from working up.  Vital signs in last 24 hours: [min-max] current  Temp:  [97.5 F (36.4 C)-97.8 F (36.6 C)] 97.8 F (36.6 C) (11/08 0457) Pulse Rate:  [80-88] 85 (11/08 0457) Resp:  [18-22] 20 (11/08 0457) BP: (92-118)/(50-62) 106/57 (11/08 0457) SpO2:  [91 %-96 %] 91 % (11/08 0457)     Height: 5' 8.5" (174 cm) Weight: 96.2 kg BMI (Calculated): 31.78   Physical Exam:  Constitutional: alert, cooperative and no distress  Respiratory: breathing non-labored at rest  Cardiovascular: regular rate and sinus rhythm  Gastrointestinal: soft, non-tender, and non-distended  Labs:  CBC Latest Ref Rng & Units 06/26/2021 06/25/2021 06/23/2021  WBC 4.0 - 10.5 K/uL 12.2(H) 11.4(H) 14.9(H)  Hemoglobin 13.0 - 17.0 g/dL 8.9(L) 8.7(L) 9.5(L)  Hematocrit 39.0 - 52.0 % 27.9(L) 26.4(L) 28.4(L)  Platelets 150 - 400 K/uL 206 211 188   CMP Latest Ref Rng & Units 06/26/2021 06/25/2021 06/23/2021  Glucose 70 - 99 mg/dL 107(H) 110(H) 160(H)  BUN 8 - 23 mg/dL 59(H) 66(H) 53(H)  Creatinine 0.61 - 1.24 mg/dL 3.49(H) 3.50(H) 2.51(H)  Sodium 135 - 145 mmol/L 130(L) 131(L) 134(L)  Potassium 3.5 - 5.1 mmol/L 4.5 4.5 4.7  Chloride 98 - 111 mmol/L 105 104 105  CO2 22 - 32 mmol/L 16(L) 18(L) 21(L)  Calcium 8.9 - 10.3 mg/dL 7.7(L) 7.8(L) 8.0(L)  Total Protein 6.5 - 8.1 g/dL - - -  Total Bilirubin 0.3 - 1.2 mg/dL - - -  Alkaline Phos 38 - 126 U/L - - -  AST 15 - 41 U/L - - -  ALT 0 - 44 U/L - - -    Imaging  studies: No new pertinent imaging studies   Assessment/Plan:  87 y.o. male with diverticulitis of small intestine with perforation 4 Day Post-Op s/p small bowel resection and anastomosis, complicated by pertinent comorbidities including COPD, hypertension, stage IIIb chronic kidney disease, history of CVA, sleep apnea.  As per patient he was not feeling well but was not able to tell me any specific reason why he was not feeling well.  I woke him up from sleep and upon questioning of nausea or vomiting or abdominal pain he denies all of those.  Vital signs have been stable.  There is no reported vomiting episode.  He had 2 bowel movement yesterday.  Maybe the patient was still waking up and uncomfortable.  I told him that was can come back later when he is more awake and he nodded yes.  I will reevaluate patient later today decided diet can be advanced.  Arnold Long, MD

## 2021-06-26 NOTE — Plan of Care (Signed)

## 2021-06-26 NOTE — Progress Notes (Signed)
Patient ID: Trevor Ferguson, male   DOB: 1925-10-30, 87 y.o.   MRN: [id number]  Patient reevaluated.  He was still found asleep.  As per discussion with nurse he received oxycodone Intestinex this morning due to the cough and the abdominal pain.  This might be the cause of his prolonged sleep.  Patient is arousable.  Vitals:   06/26/21 0802 06/26/21 1149  BP: (!) 110/59 (!) 123/59  Pulse: 88 79  Resp: 17 18  Temp: 97.9 F (36.6 C)   SpO2: 94% 100%   A/P: Patient today more sleepy than usual.  X-ray without any significant finding.  Adequate O2 sat.  There is no significant change in his abdominal physical exam compared to yesterday.  Appreciate hospitalist management of medical comorbidities.  We will continue clear liquids until patient is more awake and I can assess if I can advance his diet to full liquids.  We will try to see if he is more awake later today.  Herbert Pun, MD, FACS

## 2021-06-27 DIAGNOSIS — K5780 Diverticulitis of intestine, part unspecified, with perforation and abscess without bleeding: Secondary | ICD-10-CM

## 2021-06-27 DIAGNOSIS — R0902 Hypoxemia: Secondary | ICD-10-CM

## 2021-06-27 DIAGNOSIS — J189 Pneumonia, unspecified organism: Secondary | ICD-10-CM

## 2021-06-27 DIAGNOSIS — K5792 Diverticulitis of intestine, part unspecified, without perforation or abscess without bleeding: Secondary | ICD-10-CM | POA: Diagnosis not present

## 2021-06-27 DIAGNOSIS — Y95 Nosocomial condition: Secondary | ICD-10-CM

## 2021-06-27 DIAGNOSIS — N179 Acute kidney failure, unspecified: Secondary | ICD-10-CM

## 2021-06-27 LAB — CBC WITH DIFFERENTIAL/PLATELET
Abs Immature Granulocytes: 0.33 10*3/uL — ABNORMAL HIGH (ref 0.00–0.07)
Basophils Absolute: 0.1 10*3/uL (ref 0.0–0.1)
Basophils Relative: 1 %
Eosinophils Absolute: 0.1 10*3/uL (ref 0.0–0.5)
Eosinophils Relative: 1 %
HCT: 26.7 % — ABNORMAL LOW (ref 39.0–52.0)
Hemoglobin: 8.8 g/dL — ABNORMAL LOW (ref 13.0–17.0)
Immature Granulocytes: 3 %
Lymphocytes Relative: 4 %
Lymphs Abs: 0.5 10*3/uL — ABNORMAL LOW (ref 0.7–4.0)
MCH: 30.6 pg (ref 26.0–34.0)
MCHC: 33 g/dL (ref 30.0–36.0)
MCV: 92.7 fL (ref 80.0–100.0)
Monocytes Absolute: 1.6 10*3/uL — ABNORMAL HIGH (ref 0.1–1.0)
Monocytes Relative: 13 %
Neutro Abs: 9.5 10*3/uL — ABNORMAL HIGH (ref 1.7–7.7)
Neutrophils Relative %: 78 %
Platelets: 213 10*3/uL (ref 150–400)
RBC: 2.88 MIL/uL — ABNORMAL LOW (ref 4.22–5.81)
RDW: 15.3 % (ref 11.5–15.5)
WBC: 12.1 10*3/uL — ABNORMAL HIGH (ref 4.0–10.5)
nRBC: 0 % (ref 0.0–0.2)

## 2021-06-27 LAB — BASIC METABOLIC PANEL
Anion gap: 8 (ref 5–15)
BUN: 61 mg/dL — ABNORMAL HIGH (ref 8–23)
CO2: 19 mmol/L — ABNORMAL LOW (ref 22–32)
Calcium: 7.9 mg/dL — ABNORMAL LOW (ref 8.9–10.3)
Chloride: 105 mmol/L (ref 98–111)
Creatinine, Ser: 3.71 mg/dL — ABNORMAL HIGH (ref 0.61–1.24)
GFR, Estimated: 15 mL/min — ABNORMAL LOW (ref >60)
Glucose, Bld: 140 mg/dL — ABNORMAL HIGH (ref 70–99)
Potassium: 4.2 mmol/L (ref 3.5–5.1)
Sodium: 132 mmol/L — ABNORMAL LOW (ref 135–145)

## 2021-06-27 LAB — BLOOD GAS, VENOUS
Acid-base deficit: 9.2 mmol/L — ABNORMAL HIGH (ref 0.0–2.0)
Bicarbonate: 18 mmol/L — ABNORMAL LOW (ref 20.0–28.0)
O2 Saturation: 91.8 %
Patient temperature: 37
pCO2, Ven: 44 mmHg (ref 44.0–60.0)
pH, Ven: 7.22 — ABNORMAL LOW (ref 7.250–7.430)
pO2, Ven: 76 mmHg — ABNORMAL HIGH (ref 32.0–45.0)

## 2021-06-27 LAB — GLUCOSE, CAPILLARY: Glucose-Capillary: 150 mg/dL — ABNORMAL HIGH (ref 70–99)

## 2021-06-27 MED ORDER — GLYCOPYRROLATE 1 MG PO TABS
1.0000 mg | ORAL_TABLET | Freq: Two times a day (BID) | ORAL | Status: AC
  Administered 2021-06-28 (×2): 1 mg via ORAL
  Filled 2021-06-27 (×4): qty 1

## 2021-06-27 MED ORDER — ENSURE ENLIVE PO LIQD
237.0000 mL | Freq: Three times a day (TID) | ORAL | Status: DC
  Administered 2021-06-28 – 2021-07-18 (×46): 237 mL via ORAL

## 2021-06-27 MED ORDER — DEXTROSE 5 % IV SOLN
250.0000 mg | INTRAVENOUS | Status: AC
  Administered 2021-06-27 – 2021-06-30 (×4): 250 mg via INTRAVENOUS
  Filled 2021-06-27 (×10): qty 250

## 2021-06-27 NOTE — Progress Notes (Signed)
PT Cancellation Note  Patient Details Name: Trevor Ferguson MRN: [id number] DOB: [birth year]   Cancelled Treatment:    Reason Eval/Treat Not Completed: Fatigue/lethargy limiting ability to participate  Attempted to see patient however, pt demonstrating decreased arousal and alertness. Assisted with repositioning patient in bed for comfort with +2 assist. Will re-attempt at later time as patient more appropriate and able to participate.   Andrey Campanile, SPT  Andrey Campanile 06/27/2021, 11:53 AM

## 2021-06-27 NOTE — Progress Notes (Signed)
Clinical/Bedside Swallow Evaluation Patient Details  Name: Trevor Ferguson MRN: [id number] Date of Birth: 22-Jan-1926  Today's Date: 06/27/2021 Time: SLP Start Time (ACUTE ONLY): 1015 SLP Stop Time (ACUTE ONLY): 1055 SLP Time Calculation (min) (ACUTE ONLY): 40 min  Past Medical History:  Past Medical History:  Diagnosis Date   COPD (chronic obstructive pulmonary disease) ([place])    Hypertension    Pneumonia    2012,1985 hospitalized   Prostate cancer ([place])    with seed placement   Sleep apnea    noncompliant, dx 1996   Stroke ([place]) 2012   no residuals   Past Surgical History:  Past Surgical History:  Procedure Laterality Date   adenoid     APPENDECTOMY     BOWEL RESECTION N/A 06/22/2021   Procedure: SMALL BOWEL RESECTION;  Surgeon: Herbert Pun, MD;  Location: [facility] ORS;  Service: General;  Laterality: N/A;   COLONOSCOPY WITH PROPOFOL N/A 01/28/2021   Procedure: COLONOSCOPY WITH PROPOFOL;  Surgeon: Lucilla Lame, MD;  Location: [facility] ENDOSCOPY;  Service: Endoscopy;  Laterality: N/A;  GI bleed   ENTEROSCOPY N/A 01/30/2021   Procedure: ENTEROSCOPY;  Surgeon: Lin Landsman, MD;  Location: [facility] ENDOSCOPY;  Service: Gastroenterology;  Laterality: N/A;   ESOPHAGOGASTRODUODENOSCOPY (EGD) WITH PROPOFOL N/A 01/27/2021   Procedure: ESOPHAGOGASTRODUODENOSCOPY (EGD) WITH PROPOFOL;  Surgeon: Lucilla Lame, MD;  Location: [facility] ENDOSCOPY;  Service: Endoscopy;  Laterality: N/A;   GIVENS CAPSULE STUDY N/A 01/28/2021   Procedure: GIVENS CAPSULE STUDY;  Surgeon: Lin Landsman, MD;  Location: [facility] ENDOSCOPY;  Service: Gastroenterology;  Laterality: N/A;   HEMORRHOID SURGERY     LAPAROTOMY N/A 06/22/2021   Procedure: EXPLORATORY LAPAROTOMY;  Surgeon: Herbert Pun, MD;  Location: [facility] ORS;  Service: General;  Laterality: N/A;   spine cyst     TONSILLECTOMY     HPI:  87 y.o. male with diverticulitis of small intestine with perforation 5 Day Post-Op s/p small bowel resection and  anastomosis, complicated by pertinent comorbidities including COPD, hypertension, stage IIIb chronic kidney disease, history of CVA, sleep apnea. Advanced to clear liquids by surgery; per RN pt not swallowing medications. Per chart review, pt with trace aspiration on barium swallow in 2020; was scheduled for OP MBS but has not completed. Wife reports some coughing with PO intake at baseline.    Assessment / Plan / Recommendation  Clinical Impression   Patient presents with moderate-severe risk for aspiration given inability to follow commands, confusion, poor awareness of POs, and delayed initiation of swallow response. Per nursing and PT, patient has had gradual decline in mentation; now with worsening renal function- nephrology on board. RN reports after prolonged holding, pt chewed and expectorated his medications; also was unable to take them in puree. SLP able to rouse patient briefly with tactile/verbal cues, however he is unable to sustain attention/arousal for more than a few seconds at a time. Unable to follow commands for oral motor exam. When ice chip placed on his tongue, pt held it orally, with verbal cues necessary to prompt any type of manipulation. Transfer appeared passive, with wet-sounding respirations suggestive of aspiration. Pt did finally initiate swallow after about 20 seconds of cuing. Discussed aspiration risk in context of altered mentation and acute illness with pt's wife and son-in-law. Recommend NPO with medications via alternative means. SLP to follow up next date; if no improvements in mentation/swallow function, consider alternative means of nutrition/hydration, or palliative care consult to clarify goals of care.     SLP Visit Diagnosis: Dysphagia, oropharyngeal phase (  R13.12)    Aspiration Risk  Moderate aspiration risk;Severe aspiration risk;Risk for inadequate nutrition/hydration    Diet Recommendation NPO   Medication Administration: Via alternative means     Other  Recommendations Oral Care Recommendations: Oral care QID    Recommendations for follow up therapy are one component of a multi-disciplinary discharge planning process, led by the attending physician.  Recommendations may be updated based on patient status, additional functional criteria and insurance authorization.  Follow up Recommendations Other (comment) (tbd)      Assistance Recommended at Discharge    Functional Status Assessment    Frequency and Duration min 2x/week  2 weeks       Prognosis Prognosis for Safe Diet Advancement: Fair Barriers to Reach Goals: Cognitive deficits      Swallow Study   General Date of Onset: 06/21/21 HPI: 87 y.o. male with diverticulitis of small intestine with perforation 5 Day Post-Op s/p small bowel resection and anastomosis, complicated by pertinent comorbidities including COPD, hypertension, stage IIIb chronic kidney disease, history of CVA, sleep apnea. Advanced to clear liquids by surgery; per RN pt not swallowing medications. Per chart review, pt with trace aspiration on barium swallow in 2020; was scheduled for OP MBS but has not completed. Wife reports some coughing with PO intake at baseline. Type of Study: Bedside Swallow Evaluation Previous Swallow Assessment: trace aspiration on barium swallow at OSH in 2020; MBS was ordered and never completed Diet Prior to this Study: Thin liquids Temperature Spikes Noted: No Respiratory Status: Nasal cannula (4L) History of Recent Intubation: Yes (for procedure) Date extubated: 06/22/21 Behavior/Cognition: Confused;Lethargic/Drowsy;Doesn't follow directions Oral Cavity Assessment: Dry Oral Care Completed by SLP: Yes Oral Cavity - Dentition: Adequate natural dentition Self-Feeding Abilities: Total assist Patient Positioning: Upright in bed Baseline Vocal Quality: Other (comment) (mumbling only) Volitional Cough: Cognitively unable to elicit Volitional Swallow: Unable to elicit     Oral/Motor/Sensory Function Overall Oral Motor/Sensory Function: Generalized oral weakness (difficult to assess due to inability to follow commands)   Ice Chips Ice chips: Impaired Presentation: Spoon Oral Phase Impairments: Reduced labial seal;Reduced lingual movement/coordination;Poor awareness of bolus Oral Phase Functional Implications: Oral holding;Prolonged oral transit Pharyngeal Phase Impairments: Suspected delayed Swallow;Wet Vocal Quality (extended time and max cues to initiate, approximately 20 seconds after passive transfer)   Thin Liquid Thin Liquid: Not tested    Nectar Thick Nectar Thick Liquid: Not tested   Honey Thick Honey Thick Liquid: Not tested   Puree Puree: Not tested   Solid    Deneise Lever, MS, CCC-SLP Speech-Language Pathologist  Solid: Not tested      Aliene Altes 06/27/2021,12:57 PM

## 2021-06-27 NOTE — Progress Notes (Signed)
[facility]  ROUNDING NOTE   Subjective:   Trevor Ferguson is a 87 year old male with past medical history of hypertension, sleep apnea, COPD, CVA, and chronic kidney disease stage IIIb.  Patient presents to the emergency room from his assisted living facility with complaints of right upper quadrant pain, nausea and vomiting.  He has been admitted for Diverticulitis [K57.92] AKI (acute kidney injury) ([place]) [N17.9] Acute diverticulitis [K57.92] Perforation of intestine due to diverticulitis of gastrointestinal tract [K57.80]  Patient is known to our practice and sees Dr. Juleen China in office.  Patient was seen in office by Dr. Juleen China on April 16, 2021.  Patient is seen resting quietly in bed.  Wife and son-in-law at bedside.  Patient's somnolent and unable to participate in interview this morning.  Information obtained from wife, son-in-law, and chart review.  Wife states patient has been complained of progressive abdominal pain over the past few weeks.  He recently moved into assisted living with her from independent living at [facility].  She states decreased appetite with progression of pain.  Intermittent nausea and vomiting.  Denies chest pain, cough, shortness of breath, and fever.  Wife states patient has become more fatigued and not as active over the past year.  We have been consulted for worsening acute kidney injury on CKD stage IIIb during this admission.  Creatinine on admission was 2.65 with GFR 22.  Creatinine currently 3.71 with a GFR of 15.  BUN also elevated to 61 from 56 at time of admission.  Renal ultrasound on 06/25/2021 shows right renal calculus and left renal cyst.  Abdominal CT shows diverticulitis with suspected microperforation. Patient underwent ex lap for small bowel resection on 06/22/2021.    Objective:  Vital signs in last 24 hours:  Temp:  [97.5 F (36.4 C)-98.8 F (37.1 C)] 98.8 F (37.1 C) (11/09 1517) Pulse Rate:  [71-90] 71 (11/09 1517) Resp:   [15-18] 17 (11/09 1517) BP: (105-122)/(54-65) 122/60 (11/09 1517) SpO2:  [91 %-97 %] 91 % (11/09 1517) FiO2 (%):  [24 %] 24 % (11/08 1752) Weight:  [103.6 kg-104.4 kg] 104.4 kg (11/09 0458)  Weight change:  Filed Weights   06/22/21 2134 06/26/21 1549 06/27/21 0458  Weight: 96.2 kg 103.6 kg 104.4 kg    Intake/Output: I/O last 3 completed shifts: In: 1831.4 [P.O.:120; I.V.:1023; IV Piggyback:688.3] Out: 500 [Urine:500]   Intake/Output this shift:  Total I/O In: 896.3 [I.V.:896.3] Out: 650 [Urine:650]  Physical Exam: General: NAD, resting quietly  Head: Normocephalic, atraumatic. Moist oral mucosal membranes  Eyes: Anicteric  Lungs:  Clear to auscultation, normal effort, [place] O2  Heart: Regular rate and rhythm  Abdomen:  Soft, nontender, nondistended  Extremities: Trace peripheral edema.  Neurologic: Nonfocal, moving all four extremities  Skin: No lesions       Basic Metabolic Panel: Recent Labs  Lab 06/23/21 0439 06/25/21 0510 06/26/21 0429 06/26/21 1635 06/27/21 0334  NA 134* 131* 130* 130* 132*  K 4.7 4.5 4.5 4.8 4.2  CL 105 104 105 104 105  CO2 21* 18* 16* 17* 19*  GLUCOSE 160* 110* 107* 109* 140*  BUN 53* 66* 59* 62* 61*  CREATININE 2.51* 3.50* 3.49* 3.75* 3.71*  CALCIUM 8.0* 7.8* 7.7* 7.9* 7.9*  MG 2.2  --   --   --   --   PHOS 5.4*  --   --   --   --     Liver Function Tests: Recent Labs  Lab 06/21/21 1621  AST 13*  ALT 9  ALKPHOS 55  BILITOT 0.9  PROT 7.0  ALBUMIN 4.2   Recent Labs  Lab 06/21/21 1621  LIPASE 34   No results for input(s): AMMONIA in the last 168 hours.  CBC: Recent Labs  Lab 06/23/21 0439 06/25/21 0510 06/26/21 0429 06/26/21 1635 06/27/21 0334  WBC 14.9* 11.4* 12.2* 14.6* 12.1*  NEUTROABS  --  8.5* 9.2* 11.2* 9.5*  HGB 9.5* 8.7* 8.9* 9.3* 8.8*  HCT 28.4* 26.4* 27.9* 28.9* 26.7*  MCV 93.1 96.4 96.2 96.0 92.7  PLT 188 211 206 231 213    Cardiac Enzymes: No results for input(s): CKTOTAL, CKMB, CKMBINDEX,  TROPONINI in the last 168 hours.  BNP: Invalid input(s): POCBNP  CBG: Recent Labs  Lab 06/27/21 0730  GLUCAP 150*    Microbiology: Results for orders placed or performed during the hospital encounter of 06/21/21  Resp Panel by RT-PCR (Flu A&B, Covid) Nasopharyngeal Swab     Status: None   Collection Time: 06/21/21  8:38 PM   Specimen: Nasopharyngeal Swab; Nasopharyngeal(NP) swabs in vial transport medium  Result Value Ref Range Status   SARS Coronavirus 2 by RT PCR NEGATIVE NEGATIVE Final    Comment: (NOTE) SARS-CoV-2 target nucleic acids are NOT DETECTED.  The SARS-CoV-2 RNA is generally detectable in upper respiratory specimens during the acute phase of infection. The lowest concentration of SARS-CoV-2 viral copies this assay can detect is 138 copies/mL. A negative result does not preclude SARS-Cov-2 infection and should not be used as the sole basis for treatment or other patient management decisions. A negative result may occur with  improper specimen collection/handling, submission of specimen other than nasopharyngeal swab, presence of viral mutation(s) within the areas targeted by this assay, and inadequate number of viral copies(<138 copies/mL). A negative result must be combined with clinical observations, patient history, and epidemiological information. The expected result is Negative.  Fact Sheet for Patients:  EntrepreneurPulse.com.au  Fact Sheet for Healthcare Providers:  IncredibleEmployment.be  This test is no t yet approved or cleared by the [place] FDA and  has been authorized for detection and/or diagnosis of SARS-CoV-2 by FDA under an Emergency Use Authorization (EUA). This EUA will remain  in effect (meaning this test can be used) for the duration of the COVID-19 declaration under Section 564(b)(1) of the Act, 21 U.S.C.section 360bbb-3(b)(1), unless the authorization is terminated  or revoked sooner.        Influenza A by PCR NEGATIVE NEGATIVE Final   Influenza B by PCR NEGATIVE NEGATIVE Final    Comment: (NOTE) The Xpert Xpress SARS-CoV-2/FLU/RSV plus assay is intended as an aid in the diagnosis of influenza from Nasopharyngeal swab specimens and should not be used as a sole basis for treatment. Nasal washings and aspirates are unacceptable for Xpert Xpress SARS-CoV-2/FLU/RSV testing.  Fact Sheet for Patients: EntrepreneurPulse.com.au  Fact Sheet for Healthcare Providers: IncredibleEmployment.be  This test is not yet approved or cleared by the [place] FDA and has been authorized for detection and/or diagnosis of SARS-CoV-2 by FDA under an Emergency Use Authorization (EUA). This EUA will remain in effect (meaning this test can be used) for the duration of the COVID-19 declaration under Section 564(b)(1) of the Act, 21 U.S.C. section 360bbb-3(b)(1), unless the authorization is terminated or revoked.  Performed at [facility], [address] [place], [place] [address]   CULTURE, BLOOD (ROUTINE X 2) w Reflex to ID Panel     Status: None (Preliminary result)   Collection Time: 06/26/21  8:11 PM  Specimen: BLOOD  Result Value Ref Range Status   Specimen Description BLOOD RIGHT ANTECUBITAL  Final   Special Requests   Final    BOTTLES DRAWN AEROBIC AND ANAEROBIC Blood Culture adequate volume   Culture   Final    NO GROWTH < 12 HOURS Performed at [facility], [address], [place], [place] [address]    Report Status PENDING  Incomplete  CULTURE, BLOOD (ROUTINE X 2) w Reflex to ID Panel     Status: None (Preliminary result)   Collection Time: 06/26/21  8:30 PM   Specimen: BLOOD  Result Value Ref Range Status   Specimen Description BLOOD BLOOD RIGHT FOREARM  Final   Special Requests   Final    BOTTLES DRAWN AEROBIC AND ANAEROBIC Blood Culture results may not be optimal due to an excessive volume of blood received in  culture bottles   Culture   Final    NO GROWTH < 12 HOURS Performed at [facility], [address] [place], [place] [address]    Report Status PENDING  Incomplete    Coagulation Studies: No results for input(s): LABPROT, INR in the last 72 hours.  Urinalysis: Recent Labs    06/25/21 1545  COLORURINE AMBER*  LABSPEC 1.018  PHURINE 5.0  GLUCOSEU NEGATIVE  HGBUR NEGATIVE  BILIRUBINUR NEGATIVE  KETONESUR 5*  PROTEINUR 30*  NITRITE NEGATIVE  LEUKOCYTESUR TRACE*      Imaging: CT ABDOMEN PELVIS WO CONTRAST  Result Date: 06/26/2021 CLINICAL DATA:  Status post small bowel resection for small bowel diverticulitis. Worsening clinical condition. EXAM: CT ABDOMEN AND PELVIS WITHOUT CONTRAST TECHNIQUE: Multidetector CT imaging of the abdomen and pelvis was performed following the standard protocol without IV contrast. COMPARISON:  06/21/2021 FINDINGS: Lower chest: Minimal dependent atelectasis or infiltrate in both lungs with tiny right effusion. Hepatobiliary: No focal abnormality in the liver on this study without intravenous contrast. Gallbladder is markedly distended. No intrahepatic or extrahepatic biliary dilation. Pancreas: No focal mass lesion. No dilatation of the main duct. No intraparenchymal cyst. No peripancreatic edema. Spleen: No splenomegaly. No focal mass lesion. Adrenals/Urinary Tract: No adrenal nodule or mass. Right kidney unremarkable. 2 cm low-density lesion interpolar left kidney is stable in the interval, likely a cyst. No evidence for hydroureter. The urinary bladder appears normal for the degree of distention. Stomach/Bowel: Stomach is unremarkable. No gastric wall thickening. No evidence of outlet obstruction. Duodenum is normally positioned as is the ligament of Treitz. Duodenal diverticulum noted. No small bowel wall thickening. No small bowel dilatation. Left abdominal anastomosis is unremarkable without evidence of wall thickening, pneumatosis, or adjacent  fluid/extraluminal gas. No small bowel dilatation. The terminal ileum is normal. Right colon is mildly distended with gas and fluid. Left colon is nondilated. Diverticular change noted in the left colon without diverticulitis. Vascular/Lymphatic: There is moderate atherosclerotic calcification of the abdominal aorta without aneurysm. There is no gastrohepatic or hepatoduodenal ligament lymphadenopathy. No retroperitoneal or mesenteric lymphadenopathy. No pelvic sidewall lymphadenopathy. Reproductive: Brachytherapy seeds noted in the prostate gland. Other: No intraperitoneal free fluid.  No intraperitoneal free air. Musculoskeletal: Diffuse body wall edema. No worrisome lytic or sclerotic osseous abnormality. IMPRESSION: 1. Left abdominal small bowel anastomosis is unremarkable. No evidence for wall thickening, pneumatosis, or adjacent fluid/extraluminal gas. No free fluid in the peritoneal cavity. No small bowel dilatation. 2. Marked gallbladder distension. 3. Right colon is mildly distended with gas and fluid. Imaging features are nonspecific. 4. Left colonic diverticulosis without diverticulitis. 5. Diffuse body wall edema. 6.  Aortic Atherosclerosis (ICD10-I70.0). Electronically Signed   By: Misty Stanley M.D.   On: 06/26/2021 18:28   CT HEAD WO CONTRAST (5MM)  Result Date: 06/26/2021 CLINICAL DATA:  Mental status change, unknown cause EXAM: CT HEAD WITHOUT CONTRAST TECHNIQUE: Contiguous axial images were obtained from the base of the skull through the vertex without intravenous contrast. COMPARISON:  06/23/2021 FINDINGS: Brain: No evidence of acute infarction, hemorrhage, cerebral edema, mass, mass effect, or midline shift. Ventricles and sulci are within normal limits for age. No extra-axial fluid collection. Periventricular white matter changes, likely the sequela of chronic small vessel ischemic disease. Remote lacunar infarct in the left basal ganglia Vascular: No hyperdense vessel. Atherosclerotic  calcifications in the intracranial carotid and vertebral arteries. Skull: Normal. Negative for fracture or focal lesion. Sinuses/Orbits: Mucosal thickening in the ethmoid air cells. Status post bilateral lens replacements. Other: The mastoid air cells are well aerated. IMPRESSION: No acute intracranial process. Electronically Signed   By: Merilyn Baba M.D.   On: 06/26/2021 18:15   DG Chest Port 1 View  Result Date: 06/26/2021 CLINICAL DATA:  Hypoxia. EXAM: PORTABLE CHEST 1 VIEW COMPARISON:  Prior chest radiographs 03/06/2020 and earlier. FINDINGS: Cardiomegaly, unchanged. Subtle ill-defined opacity within the right mid-lung. No appreciable airspace consolidation within the left lung. No evidence of pleural effusion or pneumothorax. No acute bony abnormality identified. Degenerative changes of the spine. IMPRESSION: Subtle ill-defined opacity within the right mid-lung, which may reflect scarring, atelectasis or early pneumonia. Clinical correlation is recommended. Additionally, short-interval radiographic follow-up is recommended. Cardiomegaly, unchanged. Electronically Signed   By: Kellie Simmering D.O.   On: 06/26/2021 11:09     Medications:    sodium chloride 10 mL/hr at 06/26/21 1905   azithromycin     piperacillin-tazobactam (ZOSYN)  IV 2.25 g (06/27/21 1524)   sodium bicarbonate 150 mEq in D5W infusion 75 mL/hr at 06/27/21 1302    acidophilus  2 capsule Oral TID   amLODipine  5 mg Oral Daily   benzonatate  200 mg Oral TID   budesonide (PULMICORT) nebulizer solution  0.5 mg Nebulization Daily   Chlorhexidine Gluconate Cloth  6 each Topical Daily   cholecalciferol  1,000 Units Oral Daily   escitalopram  10 mg Oral Daily   famotidine  10 mg Oral BID   feeding supplement  237 mL Oral TID BM   ferrous sulfate  325 mg Oral Q breakfast   glycopyrrolate  1 mg Oral BID   heparin injection (subcutaneous)  5,000 Units Subcutaneous Q8H   mouth rinse  15 mL Mouth Rinse BID   multivitamin with  minerals  1 tablet Oral Daily   traZODone  150 mg Oral QHS   sodium chloride, acetaminophen **OR** acetaminophen, albuterol, guaiFENesin-dextromethorphan, lactulose, menthol-cetylpyridinium, morphine injection, ondansetron **OR** ondansetron (ZOFRAN) IV, oxyCODONE-acetaminophen, simethicone  Assessment/ Plan:  Mr. GRASON BRAILSFORD is a 87 y.o.  male with past medical history of hypertension, sleep apnea, COPD, CVA, and chronic kidney disease stage IIIb.  Patient presents to the emergency room from his assisted living facility with complaints of right upper quadrant pain, nausea and vomiting.  He has been admitted for Diverticulitis [K57.92] AKI (acute kidney injury) ([place]) [N17.9] Acute diverticulitis [K57.92] Perforation of intestine due to diverticulitis of gastrointestinal tract [K57.80]   Acute Kidney Injury on chronic kidney disease stage 3b with baseline creatinine 1.8 and GFR of 35 on 03/28/21.  Acute kidney injury secondary to prolonged poor oral intake vs infectious process and due to bowel perforation Chronic kidney  disease secondary to hypertension, NSAIDs, and diabetes Renal ultrasound shows right renal calculus and left renal cyst No IV contrast exposure.  No indication for dialysis at this time.  Continue IV fluids.  Avoid nephrotoxic agents and therapies, if possible.  We will continue to monitor closely.  Lab Results  Component Value Date   CREATININE 3.71 (H) 06/27/2021   CREATININE 3.75 (H) 06/26/2021   CREATININE 3.49 (H) 06/26/2021    Intake/Output Summary (Last 24 hours) at 06/27/2021 1544 Last data filed at 06/27/2021 1300 Gross per 24 hour  Intake 1384.57 ml  Output 1150 ml  Net 234.57 ml   2.  Hypertension with chronic kidney disease.  Home regimen includes amlodipine, losartan-hydrochlorothiazide, and torsemide.  Currently receiving amlodipine.  BP currently 122/60     LOS: 6   11/9/20223:44 PM

## 2021-06-27 NOTE — Progress Notes (Signed)
Patient continuously removes bipap off despite redirection, reminder. Switched to nasal cannula at 5LPM; desats when asleep. Oxygen increased to 7LPM.  Patient then started pulling his foley catheter; redirected. Continuously pulling on catheter; mittens applied.

## 2021-06-27 NOTE — TOC Progression Note (Signed)
Transition of Care [facility]) - Progression Note    Patient Details  Name: CALIX HEINBAUGH MRN: [id number] Date of Birth: [birth year]  Transition of Care [facility]) CM/SW [place], LCSW Phone Number: 06/27/2021, 11:56 AM  Clinical Narrative:   Pt from [facility], in order for pt to get therapy when return will need PT note in order to get auth. PT unable to work with pt. Will submit for auth closer to dc.    Expected Discharge Plan: Long Term Nursing Home Barriers to Discharge: Continued Medical Work up  Expected Discharge Plan and Services Expected Discharge Plan: [place]       Living arrangements for the past 2 months: [place]                                       Social Determinants of Health (SDOH) Interventions    Readmission Risk Interventions Readmission Risk Prevention Plan 06/23/2021 01/29/2021  Transportation Screening Complete Complete  PCP or Specialist Appt within 3-5 Days Complete Complete  HRI or Home Care Consult Complete Complete  Social Work Consult for [place] Planning/Counseling Complete Complete  Palliative Care Screening Not Applicable Not Applicable  Medication Review Press photographer) Complete Complete  Some recent data might be hidden

## 2021-06-27 NOTE — Progress Notes (Signed)
Nutrition Follow-up  DOCUMENTATION CODES:   Obesity unspecified  INTERVENTION:   -D/c Boost Breeze -MVI with minerals daily -Ensure Enlive po TID, each supplement provides 350 kcal and 20 grams of protein  -Hormel Shake BID, each supplement provides 520 kcals and 22 grams protein -Feeding assistance with meals -RD will follow for diet advancement and adjust supplement regimen as appropriate  NUTRITION DIAGNOSIS:   Inadequate oral intake related to acute illness as evidenced by other (comment) (pt on clear liquid diet).  Progressing; advanced to full liquid diet today  GOAL:   Patient will meet greater than or equal to 90% of their needs  Progressing   MONITOR:   PO intake, Supplement acceptance, Labs, Weight trends, Skin, I & O's, Diet advancement  REASON FOR ASSESSMENT:   NPO/Clear Liquid Diet    ASSESSMENT:   87 y/o male with h/o COPD, hypertension, prostate cancer, stage IIIb chronic kidney disease, CVA and sleep apnea who is admitted with peforated diverticulits now s/p ex lap with small bowel resection with removal of 33cm of jejunum on 27/0 complicated by post op ileus.  11/4- s/p Exploratory laparotomy with small bowel resection 11/5- pt pulled out NGT, advanced to clear liquid diet 11/9- advanced to full liquid diet  Reviewed I/O's: +108 ml x 24 hours and +6.3 L since admission  UOP: 500 ml x 24 hours  Case discussed with MD, who reports that pt has improved today. He is more alert, but remains with respiratory secretions. MD reports plan to advance to full liquid diet, however, voices concern about pt to take adequate nutrition as he require feeding assistance.   Medications reviewed and include vitamin D3 and ferrous sulfate.  Labs reviewed: Na: 132, CBGS: 150 (inpatient orders for glycemic control are none).     Diet Order:   Diet Order             Diet full liquid Room service appropriate? Yes; Fluid consistency: Thin  Diet effective now                    EDUCATION NEEDS:   Not appropriate for education at this time  Skin:  Skin Assessment: Skin Integrity Issues: Skin Integrity Issues:: Incisions Incisions: closed abdomen  Last BM:  06/25/21  Height:   Ht Readings from Last 1 Encounters:  06/22/21 5' 8.5" (1.74 m)    Weight:   Wt Readings from Last 1 Encounters:  06/27/21 104.4 kg    Ideal Body Weight:  71.36 kg  BMI:  Body mass index is 34.49 kg/m.  Estimated Nutritional Needs:   Kcal:  1900-2200kcal/day  Protein:  95-110g/day  Fluid:  1.8-2.1L/day    Loistine Chance, RD, LDN, [place] Registered Dietitian II Certified Diabetes Care and Education Specialist Please refer to AMION for RD and/or RD on-call/weekend/after hours pager

## 2021-06-27 NOTE — Progress Notes (Signed)
Patient ID: Trevor Ferguson, male   DOB: [birth year], 87 y.o.   MRN: [id number]     [place] Hospital Day(s): 6.   Interval History: Patient seen and examined.  He was found more alert than yesterday but disoriented.  He was found with a lot of upper respiratory secretions.  He has been disoriented this morning pulling his BiPAP machine and trying to pull Foley.  Mittens in place.  Patient was calm and on the moment of my evaluation.  He does seem to be uncomfortable with the upper respiratory secretions.  Flowsheet shows bowel incontinence about no description of stool.  Vital signs in last 24 hours: [min-max] current  Temp:  [97.5 F (36.4 C)-98.3 F (36.8 C)] 98.3 F (36.8 C) (11/09 0728) Pulse Rate:  [79-90] 90 (11/09 0728) Resp:  [15-18] 18 (11/09 0728) BP: (105-123)/(54-65) 109/55 (11/09 0728) SpO2:  [86 %-100 %] 95 % (11/09 0832) FiO2 (%):  [24 %] 24 % (11/08 1752) Weight:  [103.6 kg-104.4 kg] 104.4 kg (11/09 0458)     Height: 5' 8.5" (174 cm) Weight: 104.4 kg BMI (Calculated): 34.48   Physical Exam:  Constitutional: alert, cooperative and no distress  Respiratory: breathing non-labored at rest  Cardiovascular: regular rate and sinus rhythm  Gastrointestinal: soft, wound is dry and clean.   Labs:  CBC Latest Ref Rng & Units 06/27/2021 06/26/2021 06/26/2021  WBC 4.0 - 10.5 K/uL 12.1(H) 14.6(H) 12.2(H)  Hemoglobin 13.0 - 17.0 g/dL 8.8(L) 9.3(L) 8.9(L)  Hematocrit 39.0 - 52.0 % 26.7(L) 28.9(L) 27.9(L)  Platelets 150 - 400 K/uL 213 231 206   CMP Latest Ref Rng & Units 06/27/2021 06/26/2021 06/26/2021  Glucose 70 - 99 mg/dL 140(H) 109(H) 107(H)  BUN 8 - 23 mg/dL 61(H) 62(H) 59(H)  Creatinine 0.61 - 1.24 mg/dL 3.71(H) 3.75(H) 3.49(H)  Sodium 135 - 145 mmol/L 132(L) 130(L) 130(L)  Potassium 3.5 - 5.1 mmol/L 4.2 4.8 4.5  Chloride 98 - 111 mmol/L 105 104 105  CO2 22 - 32 mmol/L 19(L) 17(L) 16(L)  Calcium 8.9 - 10.3 mg/dL 7.9(L) 7.9(L) 7.7(L)  Total Protein 6.5 - 8.1 g/dL  - - -  Total Bilirubin 0.3 - 1.2 mg/dL - - -  Alkaline Phos 38 - 126 U/L - - -  AST 15 - 41 U/L - - -  ALT 0 - 44 U/L - - -    Imaging studies: CT scan of the abdomen and pelvis shows no intra-abdominal pathology.  No free air, no free fluid.  Anastomosis looks patent without any sign of leak.  No pneumatosis.  No intra-abdominal explanation of patient deterioration of clinical status.   Assessment/Plan:  87 y.o. male with diverticulitis of small intestine with perforation 5 Day Post-Op s/p small bowel resection and anastomosis, complicated by pertinent comorbidities including COPD, hypertension, stage IIIb chronic kidney disease, history of CVA, sleep apnea.  Patient today is more alert but still confused.  He has been trying to pull the BiPAP and Foley.  As per imaging yesterday the abdomen is not the cause of his clinical deterioration.  From a standpoint I will order full liquid diet but patient need assistant for feeding.  Appreciate hospitalist management of medical comorbidities.  Consider nephrology evaluation for the management of acute kidney injury and metabolic acidosis.  I will continue to follow closely.  Arnold Long, MD

## 2021-06-27 NOTE — Progress Notes (Signed)
PROGRESS NOTE  Trevor Ferguson    DOB: 02/26/1926, 87 y.o.  TWS:[id number]  PCP: [name], MD   Code Status: DNR   DOA: 06/21/2021   LOS: 6  Brief Narrative of Current Hospitalization  Trevor Ferguson is a 87 y.o. male with a PMH significant for COPD, HTN, HLD, sleep apnea, CKD 3B, CVA. They presented from SNF to the ED on 06/21/2021 with abdominal pain x a few days. In the ED, it was found that they had small bowel diverticulitis with possible microperforation.  General surgery was consulted. 11/4 patient underwent exploratory laparotomy with small bowel resection 11/6 postoperative ileus 11/8 having bowel movements, 3 L O2 requirement, increased lethargy  06/27/21 -stable  Assessment & Plan  Active Problems:   Acute diverticulitis  Acute small bowel diverticulitis- s/p resection 00/1 complicated by postoperative ileus.  Last documented stool was 11/7 -Surgery following, appreciate recommendations -Advance diet as tolerated -Zosyn per general surgery  Acute hypoxic respiratory failure 2/2 atelectasis versus early pneumonia  COPD in setting of decreased mental status and requiring up to 4 L [place] O2 currently to maintain oxygen saturations above 90%.  No oxygen use at baseline.  Patient did not tolerate BiPAP overnight.  -Add on respiratory coverage antibiotics.  Has documented penicillin allergy -Repeat chest x-ray if worsening -DuoNebs twice daily -Robinul twice daily  AKI on CKD IIIb- creatinine remains elevated at 3.7.  Repeat blood gas today shows improvement in pH to 7.22, PCO2 44, PO2 76, bicarb 18 -Nephrology following, appreciate recommendations -Continue bicarb drip -Monitor serum bicarb daily  Primary HTN-well-controlled with baseline low diastolic readings -Continue on Norvasc -Holding home ACE inhibitor due to kidney function  Depression -Continue home Lexapro  GERD -Continue home PPI  Chronic iron deficiency anemia -Continue home iron  DVT  prophylaxis: heparin injection 5,000 Units Start: 06/27/21 0600   Diet:  Diet Orders (From admission, onward)     Start     Ordered   06/23/21 1056  Diet clear liquid Room service appropriate? Yes; Fluid consistency: Thin  Diet effective now       Question Answer Comment  Room service appropriate? Yes   Fluid consistency: Thin      06/23/21 1055            Subjective 06/27/21    Pt alerts to voice.  Unable to follow commands or answer questions appropriately.  Disposition Plan & Communication  Patient status: Inpatient  Admitted From: SNF Disposition: Skilled nursing facility Anticipated discharge date: TBD  Family Communication: We will call daughter today, no family at bedside Consults, Procedures, Significant Events  Consultants:  Nephrology General surgery  Procedures/significant events:  Small bowel resection 11/4 Antimicrobials:  Anti-infectives (From admission, onward)    Start     Dose/Rate Route Frequency Ordered Stop   06/26/21 1800  piperacillin-tazobactam (ZOSYN) IVPB 2.25 g        2.25 g 100 mL/hr over 30 Minutes Intravenous Every 6 hours 06/26/21 1604     06/22/21 2100  ceFEPIme (MAXIPIME) 2 g in sodium chloride 0.9 % 100 mL IVPB  Status:  Discontinued        2 g 200 mL/hr over 30 Minutes Intravenous Every 24 hours 06/21/21 2331 06/26/21 1604   06/22/21 0600  metroNIDAZOLE (FLAGYL) IVPB 500 mg  Status:  Discontinued        500 mg 100 mL/hr over 60 Minutes Intravenous Every 8 hours 06/21/21 2331 06/26/21 1604   06/21/21 2100  ceFEPIme (MAXIPIME) 2  g in sodium chloride 0.9 % 100 mL IVPB        2 g 200 mL/hr over 30 Minutes Intravenous  Once 06/21/21 2050 06/21/21 2130   06/21/21 2100  metroNIDAZOLE (FLAGYL) IVPB 500 mg        500 mg 100 mL/hr over 60 Minutes Intravenous  Once 06/21/21 2050 06/22/21 1616       Objective   Vitals:   06/26/21 2326 06/27/21 0444 06/27/21 0458 06/27/21 0728  BP: (!) 105/54 115/65  (!) 109/55  Pulse: 87 84  90   Resp: 16 18  18   Temp: (!) 97.5 F (36.4 C) 97.6 F (36.4 C)  98.3 F (36.8 C)  TempSrc:  Oral  Axillary  SpO2: 92% 91%  93%  Weight:   104.4 kg   Height:        Intake/Output Summary (Last 24 hours) at 06/27/2021 0743 Last data filed at 06/27/2021 0446 Gross per 24 hour  Intake 608.32 ml  Output 500 ml  Net 108.32 ml   Filed Weights   06/22/21 2134 06/26/21 1549 06/27/21 0458  Weight: 96.2 kg 103.6 kg 104.4 kg    Patient BMI: Body mass index is 34.49 kg/m.   Physical Exam: General: Asleep, NAD HEENT: Dry mucous membranes Respiratory: normal respiratory effort.  Significant stridor on exam with Rales. Cardiovascular: normal S1/S2, RRR, no JVD, murmurs, rubs, gallops, quick capillary refill  Gastrointestinal: soft, NT, ND, no HSM felt Nervous: Alert to voice and quickly falls back asleep.  Unable to follow commands Extremities: Trace lower extremity edema Skin: dry, intact, normal temperature, normal color, No rashes, lesions or ulcers on exposed skin  Labs   I have personally reviewed following labs and imaging studies BMP Latest Ref Rng & Units 06/27/2021 06/26/2021 06/26/2021  Glucose 70 - 99 mg/dL 140(H) 109(H) 107(H)  BUN 8 - 23 mg/dL 61(H) 62(H) 59(H)  Creatinine 0.61 - 1.24 mg/dL 3.71(H) 3.75(H) 3.49(H)  Sodium 135 - 145 mmol/L 132(L) 130(L) 130(L)  Potassium 3.5 - 5.1 mmol/L 4.2 4.8 4.5  Chloride 98 - 111 mmol/L 105 104 105  CO2 22 - 32 mmol/L 19(L) 17(L) 16(L)  Calcium 8.9 - 10.3 mg/dL 7.9(L) 7.9(L) 7.7(L)   CBC    Component Value Date/Time   WBC 12.1 (H) 06/27/2021 0334   RBC 2.88 (L) 06/27/2021 0334   HGB 8.8 (L) 06/27/2021 0334   HGB 12.8 (L) 03/19/2014 1049   HCT 26.7 (L) 06/27/2021 0334   HCT 37.7 (L) 03/19/2014 1049   PLT 213 06/27/2021 0334   PLT 137 (L) 03/19/2014 1049   MCV 92.7 06/27/2021 0334   MCV 97 03/19/2014 1049   MCH 30.6 06/27/2021 0334   MCHC 33.0 06/27/2021 0334   RDW 15.3 06/27/2021 0334   RDW 13.2 03/19/2014 1049   LYMPHSABS  0.5 (L) 06/27/2021 0334   MONOABS 1.6 (H) 06/27/2021 0334   EOSABS 0.1 06/27/2021 0334   BASOSABS 0.1 06/27/2021 0334    Imaging Studies  CT ABDOMEN PELVIS WO CONTRAST  Result Date: 06/26/2021 CLINICAL DATA:  Status post small bowel resection for small bowel diverticulitis. Worsening clinical condition. EXAM: CT ABDOMEN AND PELVIS WITHOUT CONTRAST TECHNIQUE: Multidetector CT imaging of the abdomen and pelvis was performed following the standard protocol without IV contrast. COMPARISON:  06/21/2021 FINDINGS: Lower chest: Minimal dependent atelectasis or infiltrate in both lungs with tiny right effusion. Hepatobiliary: No focal abnormality in the liver on this study without intravenous contrast. Gallbladder is markedly distended. No intrahepatic or extrahepatic biliary dilation.  Pancreas: No focal mass lesion. No dilatation of the main duct. No intraparenchymal cyst. No peripancreatic edema. Spleen: No splenomegaly. No focal mass lesion. Adrenals/Urinary Tract: No adrenal nodule or mass. Right kidney unremarkable. 2 cm low-density lesion interpolar left kidney is stable in the interval, likely a cyst. No evidence for hydroureter. The urinary bladder appears normal for the degree of distention. Stomach/Bowel: Stomach is unremarkable. No gastric wall thickening. No evidence of outlet obstruction. Duodenum is normally positioned as is the ligament of Treitz. Duodenal diverticulum noted. No small bowel wall thickening. No small bowel dilatation. Left abdominal anastomosis is unremarkable without evidence of wall thickening, pneumatosis, or adjacent fluid/extraluminal gas. No small bowel dilatation. The terminal ileum is normal. Right colon is mildly distended with gas and fluid. Left colon is nondilated. Diverticular change noted in the left colon without diverticulitis. Vascular/Lymphatic: There is moderate atherosclerotic calcification of the abdominal aorta without aneurysm. There is no gastrohepatic or  hepatoduodenal ligament lymphadenopathy. No retroperitoneal or mesenteric lymphadenopathy. No pelvic sidewall lymphadenopathy. Reproductive: Brachytherapy seeds noted in the prostate gland. Other: No intraperitoneal free fluid.  No intraperitoneal free air. Musculoskeletal: Diffuse body wall edema. No worrisome lytic or sclerotic osseous abnormality. IMPRESSION: 1. Left abdominal small bowel anastomosis is unremarkable. No evidence for wall thickening, pneumatosis, or adjacent fluid/extraluminal gas. No free fluid in the peritoneal cavity. No small bowel dilatation. 2. Marked gallbladder distension. 3. Right colon is mildly distended with gas and fluid. Imaging features are nonspecific. 4. Left colonic diverticulosis without diverticulitis. 5. Diffuse body wall edema. 6. Aortic Atherosclerosis (ICD10-I70.0). Electronically Signed   By: Misty Stanley M.D.   On: 06/26/2021 18:28   CT HEAD WO CONTRAST (5MM)  Result Date: 06/26/2021 CLINICAL DATA:  Mental status change, unknown cause EXAM: CT HEAD WITHOUT CONTRAST TECHNIQUE: Contiguous axial images were obtained from the base of the skull through the vertex without intravenous contrast. COMPARISON:  06/23/2021 FINDINGS: Brain: No evidence of acute infarction, hemorrhage, cerebral edema, mass, mass effect, or midline shift. Ventricles and sulci are within normal limits for age. No extra-axial fluid collection. Periventricular white matter changes, likely the sequela of chronic small vessel ischemic disease. Remote lacunar infarct in the left basal ganglia Vascular: No hyperdense vessel. Atherosclerotic calcifications in the intracranial carotid and vertebral arteries. Skull: Normal. Negative for fracture or focal lesion. Sinuses/Orbits: Mucosal thickening in the ethmoid air cells. Status post bilateral lens replacements. Other: The mastoid air cells are well aerated. IMPRESSION: No acute intracranial process. Electronically Signed   By: Merilyn Baba M.D.   On:  06/26/2021 18:15   DG Chest Port 1 View  Result Date: 06/26/2021 CLINICAL DATA:  Hypoxia. EXAM: PORTABLE CHEST 1 VIEW COMPARISON:  Prior chest radiographs 03/06/2020 and earlier. FINDINGS: Cardiomegaly, unchanged. Subtle ill-defined opacity within the right mid-lung. No appreciable airspace consolidation within the left lung. No evidence of pleural effusion or pneumothorax. No acute bony abnormality identified. Degenerative changes of the spine. IMPRESSION: Subtle ill-defined opacity within the right mid-lung, which may reflect scarring, atelectasis or early pneumonia. Clinical correlation is recommended. Additionally, short-interval radiographic follow-up is recommended. Cardiomegaly, unchanged. Electronically Signed   By: Kellie Simmering D.O.   On: 06/26/2021 11:09   Medications   Scheduled Meds:  acidophilus  2 capsule Oral TID   amLODipine  5 mg Oral Daily   benzonatate  200 mg Oral TID   budesonide (PULMICORT) nebulizer solution  0.5 mg Nebulization Daily   Chlorhexidine Gluconate Cloth  6 each Topical Daily   cholecalciferol  1,000 Units  Oral Daily   escitalopram  10 mg Oral Daily   famotidine  10 mg Oral BID   feeding supplement  1 Container Oral TID BM   ferrous sulfate  325 mg Oral Q breakfast   heparin injection (subcutaneous)  5,000 Units Subcutaneous Q8H   mouth rinse  15 mL Mouth Rinse BID   multivitamin with minerals  1 tablet Oral Daily   traZODone  150 mg Oral QHS   No recently discontinued medications to reconcile  LOS: 6 days   Time spent: >68min  Jeran Hiltz L Monita Swier, DO [facility] 06/27/2021, 7:43 AM   Please refer to amion to contact the [facility] Attending or Consulting provider for this pt  [email or web address] Available by Epic secure chat 7AM-7PM. If 7PM-7AM, please contact night-coverage

## 2021-06-28 DIAGNOSIS — J189 Pneumonia, unspecified organism: Secondary | ICD-10-CM | POA: Diagnosis not present

## 2021-06-28 DIAGNOSIS — K5792 Diverticulitis of intestine, part unspecified, without perforation or abscess without bleeding: Secondary | ICD-10-CM | POA: Diagnosis not present

## 2021-06-28 DIAGNOSIS — R140 Abdominal distension (gaseous): Secondary | ICD-10-CM

## 2021-06-28 DIAGNOSIS — N179 Acute kidney failure, unspecified: Secondary | ICD-10-CM | POA: Diagnosis not present

## 2021-06-28 LAB — BLOOD GAS, VENOUS
Acid-base deficit: 3.2 mmol/L — ABNORMAL HIGH (ref 0.0–2.0)
Bicarbonate: 20.9 mmol/L (ref 20.0–28.0)
O2 Saturation: 99.6 %
Patient temperature: 37
pCO2, Ven: 33 mmHg — ABNORMAL LOW (ref 44.0–60.0)
pH, Ven: 7.41 (ref 7.250–7.430)
pO2, Ven: 181 mmHg — ABNORMAL HIGH (ref 32.0–45.0)

## 2021-06-28 LAB — BASIC METABOLIC PANEL
Anion gap: 7 (ref 5–15)
BUN: 50 mg/dL — ABNORMAL HIGH (ref 8–23)
CO2: 21 mmol/L — ABNORMAL LOW (ref 22–32)
Calcium: 8 mg/dL — ABNORMAL LOW (ref 8.9–10.3)
Chloride: 105 mmol/L (ref 98–111)
Creatinine, Ser: 2.98 mg/dL — ABNORMAL HIGH (ref 0.61–1.24)
GFR, Estimated: 19 mL/min — ABNORMAL LOW (ref >60)
Glucose, Bld: 135 mg/dL — ABNORMAL HIGH (ref 70–99)
Potassium: 3.7 mmol/L (ref 3.5–5.1)
Sodium: 133 mmol/L — ABNORMAL LOW (ref 135–145)

## 2021-06-28 LAB — CBC
HCT: 28.5 % — ABNORMAL LOW (ref 39.0–52.0)
Hemoglobin: 9.5 g/dL — ABNORMAL LOW (ref 13.0–17.0)
MCH: 30.3 pg (ref 26.0–34.0)
MCHC: 33.3 g/dL (ref 30.0–36.0)
MCV: 90.8 fL (ref 80.0–100.0)
Platelets: 243 10*3/uL (ref 150–400)
RBC: 3.14 MIL/uL — ABNORMAL LOW (ref 4.22–5.81)
RDW: 14.8 % (ref 11.5–15.5)
WBC: 17.6 10*3/uL — ABNORMAL HIGH (ref 4.0–10.5)
nRBC: 0 % (ref 0.0–0.2)

## 2021-06-28 NOTE — Progress Notes (Signed)
[facility]  ROUNDING NOTE   Subjective:   Trevor Ferguson is a 87 year old male with past medical history of hypertension, sleep apnea, COPD, CVA, and chronic kidney disease stage IIIb.  Patient presents to the emergency room from his assisted living facility with complaints of right upper quadrant pain, nausea and vomiting.  He has been admitted for Diverticulitis [K57.92] AKI (acute kidney injury) ([place]) [N17.9] Acute diverticulitis [K57.92] Perforation of intestine due to diverticulitis of gastrointestinal tract [K57.80]  Patient is known to our practice and sees Dr. Juleen China in office.  Patient was seen in office by Dr. Juleen China on April 16, 2021.    Patient seen laying in bed, anxious States "something is wrong" Wife at bedside Creatinine improved to 3.1 Urine output 1.75L recorded in past 24 hours    Objective:  Vital signs in last 24 hours:  Temp:  [98.3 F (36.8 C)-98.8 F (37.1 C)] 98.3 F (36.8 C) (11/10 1200) Pulse Rate:  [71-98] 92 (11/10 1200) Resp:  [17-19] 19 (11/10 1200) BP: (122-169)/(60-83) 138/83 (11/10 1200) SpO2:  [87 %-100 %] 96 % (11/10 1200)  Weight change:  Filed Weights   06/22/21 2134 06/26/21 1549 06/27/21 0458  Weight: 96.2 kg 103.6 kg 104.4 kg    Intake/Output: I/O last 3 completed shifts: In: 3273.3 [I.V.:1243.3; IV [id number]:9024] Out: 2250 [Urine:2250]   Intake/Output this shift:  Total I/O In: 240 [P.O.:240] Out: 1000 [Urine:1000]  Physical Exam: General: NAD, anxious  Head: Normocephalic, atraumatic. Moist oral mucosal membranes  Eyes: Anicteric  Lungs:  Clear to auscultation, normal effort, [place] O2  Heart: Regular rate and rhythm  Abdomen:  Soft, nontender, nondistended  Extremities: Trace peripheral edema.  Neurologic: Nonfocal, moving all four extremities  Skin: No lesions       Basic Metabolic Panel: Recent Labs  Lab 06/23/21 0439 06/25/21 0510 06/26/21 0429 06/26/21 1635 06/27/21 0334 06/28/21 0323   NA 134* 131* 130* 130* 132* 133*  K 4.7 4.5 4.5 4.8 4.2 3.7  CL 105 104 105 104 105 105  CO2 21* 18* 16* 17* 19* 21*  GLUCOSE 160* 110* 107* 109* 140* 135*  BUN 53* 66* 59* 62* 61* 50*  CREATININE 2.51* 3.50* 3.49* 3.75* 3.71* 2.98*  CALCIUM 8.0* 7.8* 7.7* 7.9* 7.9* 8.0*  MG 2.2  --   --   --   --   --   PHOS 5.4*  --   --   --   --   --      Liver Function Tests: Recent Labs  Lab 06/21/21 1621  AST 13*  ALT 9  ALKPHOS 55  BILITOT 0.9  PROT 7.0  ALBUMIN 4.2    Recent Labs  Lab 06/21/21 1621  LIPASE 34    No results for input(s): AMMONIA in the last 168 hours.  CBC: Recent Labs  Lab 06/25/21 0510 06/26/21 0429 06/26/21 1635 06/27/21 0334 06/28/21 0323  WBC 11.4* 12.2* 14.6* 12.1* 17.6*  NEUTROABS 8.5* 9.2* 11.2* 9.5*  --   HGB 8.7* 8.9* 9.3* 8.8* 9.5*  HCT 26.4* 27.9* 28.9* 26.7* 28.5*  MCV 96.4 96.2 96.0 92.7 90.8  PLT 211 206 231 213 243     Cardiac Enzymes: No results for input(s): CKTOTAL, CKMB, CKMBINDEX, TROPONINI in the last 168 hours.  BNP: Invalid input(s): POCBNP  CBG: Recent Labs  Lab 06/27/21 0730  GLUCAP 150*     Microbiology: Results for orders placed or performed during the hospital encounter of 06/21/21  Resp Panel by RT-PCR (Flu A&B,  Covid) Nasopharyngeal Swab     Status: None   Collection Time: 06/21/21  8:38 PM   Specimen: Nasopharyngeal Swab; Nasopharyngeal(NP) swabs in vial transport medium  Result Value Ref Range Status   SARS Coronavirus 2 by RT PCR NEGATIVE NEGATIVE Final    Comment: (NOTE) SARS-CoV-2 target nucleic acids are NOT DETECTED.  The SARS-CoV-2 RNA is generally detectable in upper respiratory specimens during the acute phase of infection. The lowest concentration of SARS-CoV-2 viral copies this assay can detect is 138 copies/mL. A negative result does not preclude SARS-Cov-2 infection and should not be used as the sole basis for treatment or other patient management decisions. A negative result may  occur with  improper specimen collection/handling, submission of specimen other than nasopharyngeal swab, presence of viral mutation(s) within the areas targeted by this assay, and inadequate number of viral copies(<138 copies/mL). A negative result must be combined with clinical observations, patient history, and epidemiological information. The expected result is Negative.  Fact Sheet for Patients:  EntrepreneurPulse.com.au  Fact Sheet for Healthcare Providers:  IncredibleEmployment.be  This test is no t yet approved or cleared by the [place] FDA and  has been authorized for detection and/or diagnosis of SARS-CoV-2 by FDA under an Emergency Use Authorization (EUA). This EUA will remain  in effect (meaning this test can be used) for the duration of the COVID-19 declaration under Section 564(b)(1) of the Act, 21 U.S.C.section 360bbb-3(b)(1), unless the authorization is terminated  or revoked sooner.       Influenza A by PCR NEGATIVE NEGATIVE Final   Influenza B by PCR NEGATIVE NEGATIVE Final    Comment: (NOTE) The Xpert Xpress SARS-CoV-2/FLU/RSV plus assay is intended as an aid in the diagnosis of influenza from Nasopharyngeal swab specimens and should not be used as a sole basis for treatment. Nasal washings and aspirates are unacceptable for Xpert Xpress SARS-CoV-2/FLU/RSV testing.  Fact Sheet for Patients: EntrepreneurPulse.com.au  Fact Sheet for Healthcare Providers: IncredibleEmployment.be  This test is not yet approved or cleared by the [place] FDA and has been authorized for detection and/or diagnosis of SARS-CoV-2 by FDA under an Emergency Use Authorization (EUA). This EUA will remain in effect (meaning this test can be used) for the duration of the COVID-19 declaration under Section 564(b)(1) of the Act, 21 U.S.C. section 360bbb-3(b)(1), unless the authorization is terminated  or revoked.  Performed at [facility], [address] [place], [place] [address]   CULTURE, BLOOD (ROUTINE X 2) w Reflex to ID Panel     Status: None (Preliminary result)   Collection Time: 06/26/21  8:11 PM   Specimen: BLOOD  Result Value Ref Range Status   Specimen Description BLOOD RIGHT ANTECUBITAL  Final   Special Requests   Final    BOTTLES DRAWN AEROBIC AND ANAEROBIC Blood Culture adequate volume   Culture   Final    NO GROWTH 2 DAYS Performed at [facility], [address], [place], [place] [address]    Report Status PENDING  Incomplete  CULTURE, BLOOD (ROUTINE X 2) w Reflex to ID Panel     Status: None (Preliminary result)   Collection Time: 06/26/21  8:30 PM   Specimen: BLOOD  Result Value Ref Range Status   Specimen Description BLOOD BLOOD RIGHT FOREARM  Final   Special Requests   Final    BOTTLES DRAWN AEROBIC AND ANAEROBIC Blood Culture results may not be optimal due to an excessive volume of blood received in culture bottles   Culture  Final    NO GROWTH 2 DAYS Performed at [facility], [address] [place], [place] [address]    Report Status PENDING  Incomplete    Coagulation Studies: No results for input(s): LABPROT, INR in the last 72 hours.  Urinalysis: Recent Labs    06/25/21 1545  COLORURINE AMBER*  LABSPEC 1.018  PHURINE 5.0  GLUCOSEU NEGATIVE  HGBUR NEGATIVE  BILIRUBINUR NEGATIVE  KETONESUR 5*  PROTEINUR 30*  NITRITE NEGATIVE  LEUKOCYTESUR TRACE*       Imaging: CT ABDOMEN PELVIS WO CONTRAST  Result Date: 06/26/2021 CLINICAL DATA:  Status post small bowel resection for small bowel diverticulitis. Worsening clinical condition. EXAM: CT ABDOMEN AND PELVIS WITHOUT CONTRAST TECHNIQUE: Multidetector CT imaging of the abdomen and pelvis was performed following the standard protocol without IV contrast. COMPARISON:  06/21/2021 FINDINGS: Lower chest: Minimal dependent atelectasis or infiltrate in both lungs  with tiny right effusion. Hepatobiliary: No focal abnormality in the liver on this study without intravenous contrast. Gallbladder is markedly distended. No intrahepatic or extrahepatic biliary dilation. Pancreas: No focal mass lesion. No dilatation of the main duct. No intraparenchymal cyst. No peripancreatic edema. Spleen: No splenomegaly. No focal mass lesion. Adrenals/Urinary Tract: No adrenal nodule or mass. Right kidney unremarkable. 2 cm low-density lesion interpolar left kidney is stable in the interval, likely a cyst. No evidence for hydroureter. The urinary bladder appears normal for the degree of distention. Stomach/Bowel: Stomach is unremarkable. No gastric wall thickening. No evidence of outlet obstruction. Duodenum is normally positioned as is the ligament of Treitz. Duodenal diverticulum noted. No small bowel wall thickening. No small bowel dilatation. Left abdominal anastomosis is unremarkable without evidence of wall thickening, pneumatosis, or adjacent fluid/extraluminal gas. No small bowel dilatation. The terminal ileum is normal. Right colon is mildly distended with gas and fluid. Left colon is nondilated. Diverticular change noted in the left colon without diverticulitis. Vascular/Lymphatic: There is moderate atherosclerotic calcification of the abdominal aorta without aneurysm. There is no gastrohepatic or hepatoduodenal ligament lymphadenopathy. No retroperitoneal or mesenteric lymphadenopathy. No pelvic sidewall lymphadenopathy. Reproductive: Brachytherapy seeds noted in the prostate gland. Other: No intraperitoneal free fluid.  No intraperitoneal free air. Musculoskeletal: Diffuse body wall edema. No worrisome lytic or sclerotic osseous abnormality. IMPRESSION: 1. Left abdominal small bowel anastomosis is unremarkable. No evidence for wall thickening, pneumatosis, or adjacent fluid/extraluminal gas. No free fluid in the peritoneal cavity. No small bowel dilatation. 2. Marked gallbladder  distension. 3. Right colon is mildly distended with gas and fluid. Imaging features are nonspecific. 4. Left colonic diverticulosis without diverticulitis. 5. Diffuse body wall edema. 6. Aortic Atherosclerosis (ICD10-I70.0). Electronically Signed   By: Misty Stanley M.D.   On: 06/26/2021 18:28   CT HEAD WO CONTRAST (5MM)  Result Date: 06/26/2021 CLINICAL DATA:  Mental status change, unknown cause EXAM: CT HEAD WITHOUT CONTRAST TECHNIQUE: Contiguous axial images were obtained from the base of the skull through the vertex without intravenous contrast. COMPARISON:  06/23/2021 FINDINGS: Brain: No evidence of acute infarction, hemorrhage, cerebral edema, mass, mass effect, or midline shift. Ventricles and sulci are within normal limits for age. No extra-axial fluid collection. Periventricular white matter changes, likely the sequela of chronic small vessel ischemic disease. Remote lacunar infarct in the left basal ganglia Vascular: No hyperdense vessel. Atherosclerotic calcifications in the intracranial carotid and vertebral arteries. Skull: Normal. Negative for fracture or focal lesion. Sinuses/Orbits: Mucosal thickening in the ethmoid air cells. Status post bilateral lens replacements. Other: The mastoid air cells are well aerated. IMPRESSION:  No acute intracranial process. Electronically Signed   By: Merilyn Baba M.D.   On: 06/26/2021 18:15     Medications:    sodium chloride 10 mL/hr at 06/26/21 1905   azithromycin 250 mg (06/27/21 1623)   sodium bicarbonate 150 mEq in D5W infusion 75 mL/hr at 06/28/21 1448    acidophilus  2 capsule Oral TID   amLODipine  5 mg Oral Daily   benzonatate  200 mg Oral TID   budesonide (PULMICORT) nebulizer solution  0.5 mg Nebulization Daily   Chlorhexidine Gluconate Cloth  6 each Topical Daily   cholecalciferol  1,000 Units Oral Daily   escitalopram  10 mg Oral Daily   famotidine  10 mg Oral BID   feeding supplement  237 mL Oral TID BM   ferrous sulfate  325 mg  Oral Q breakfast   glycopyrrolate  1 mg Oral BID   heparin injection (subcutaneous)  5,000 Units Subcutaneous Q8H   mouth rinse  15 mL Mouth Rinse BID   multivitamin with minerals  1 tablet Oral Daily   traZODone  150 mg Oral QHS   sodium chloride, acetaminophen **OR** acetaminophen, albuterol, guaiFENesin-dextromethorphan, lactulose, menthol-cetylpyridinium, morphine injection, ondansetron **OR** ondansetron (ZOFRAN) IV, oxyCODONE-acetaminophen, simethicone  Assessment/ Plan:  Mr. CHADWIN FURY is a 87 y.o.  male with past medical history of hypertension, sleep apnea, COPD, CVA, and chronic kidney disease stage IIIb.  Patient presents to the emergency room from his assisted living facility with complaints of right upper quadrant pain, nausea and vomiting.  He has been admitted for Diverticulitis [K57.92] AKI (acute kidney injury) ([place]) [N17.9] Acute diverticulitis [K57.92] Perforation of intestine due to diverticulitis of gastrointestinal tract [K57.80]   Acute Kidney Injury on chronic kidney disease stage 3b with baseline creatinine 1.8 and GFR of 35 on 03/28/21.  Acute kidney injury secondary to prolonged poor oral intake vs infectious process and due to bowel perforation Chronic kidney disease secondary to hypertension, NSAIDs, and diabetes Renal ultrasound shows right renal calculus and left renal cyst No IV contrast exposure.  No indication for dialysis at this time.  Avoid nephrotoxic agents and therapies, if possible.    Creatinine improving with IVF. Continue IVF for now.   Lab Results  Component Value Date   CREATININE 2.98 (H) 06/28/2021   CREATININE 3.71 (H) 06/27/2021   CREATININE 3.75 (H) 06/26/2021    Intake/Output Summary (Last 24 hours) at 06/28/2021 1455 Last data filed at 06/28/2021 1426 Gross per 24 hour  Intake 2285.8 ml  Output 2100 ml  Net 185.8 ml    2.  Hypertension with chronic kidney disease.  Home regimen includes amlodipine,  losartan-hydrochlorothiazide, and torsemide. Receiving amlodipine.  BP 138/83     LOS: 7   11/10/20222:55 PM

## 2021-06-28 NOTE — Progress Notes (Signed)
Physical Therapy Treatment Patient Details Name: Trevor Ferguson MRN: [id number] DOB: [birth year] Today's Date: 06/28/2021   History of Present Illness Patient is a 87 year old male with PMH of COPD, prostate cancer, HTN, sleep apnea, stage IIIB CKD and CVA. He presented to ER with acute onset of R lower quadrant abdominal pain with nausea and dry heaves.  Patient underwent exploratory laparotomy with small bowl resection on 06/22/21. Patient fell on 06/23/21 while in hospital. Patient came from [facility]    PT Comments    Pt demonstrating increased alertness this morning upon entering the room. Requiring some additional encouragement from family to participate in therapy session. Pt requiring modA and verbal cues for supine to sit with mangement of B LE off EOB. Pt able to maintain static and dynamic seated balance at EOB with intermittent CGA while completing some ADLs (washing face + oral care). Pt able to stand from elevated surface with minA + 2 and RW for safety. Pt noted to desaturate to 84% in standing with 4L O2 and after seated rest break improved to 92% with cues for pursed lip breathing. Pt requiring totalA + 2 back to bed due to fatigue. Pt will continue to benefit from further skilled PT services to improve mobility and endurance with activity.    Recommendations for follow up therapy are one component of a multi-disciplinary discharge planning process, led by the attending physician.  Recommendations may be updated based on patient status, additional functional criteria and insurance authorization.  Follow Up Recommendations  Skilled nursing-short term rehab (<3 hours/day)     Assistance Recommended at Discharge Intermittent Supervision/Assistance  Equipment Recommendations  None recommended by PT    Recommendations for Other Services       Precautions / Restrictions Restrictions Weight Bearing Restrictions: No     Mobility  Bed Mobility Overal bed mobility: Needs  Assistance Bed Mobility: Supine to Sit;Sit to Supine     Supine to sit: Mod assist;HOB elevated Sit to supine: Max assist;+2 for physical assistance   General bed mobility comments: ModA for management of LE off EOB - pt able to reach for bed rails and assist with pushing to bring trunk to upright position. Total A back to bed + 2 for management of trunk and LEs due to patient fatigue    Transfers Overall transfer level: Needs assistance Equipment used: Rolling Bialy (2 wheels) Transfers: Sit to/from Stand Sit to Stand: +2 physical assistance;From elevated surface;Min assist           General transfer comment: MinA + 2 assist for standing from significantly elevated surface due to abdominal pain and generalized LE weakness    Ambulation/Gait Ambulation/Gait assistance: Min assist;+2 physical assistance Gait Distance (Feet): 4 Feet Assistive device: Rolling Arave (2 wheels) Gait Pattern/deviations: Step-to pattern       General Gait Details: Sidesteps at EOB for repositioning. Heavy verbal cues for sequencing and assistance with managing RW   Stairs             Wheelchair Mobility    Modified Rankin (Stroke Patients Only)       Balance Overall balance assessment: Needs assistance Sitting-balance support: Bilateral upper extremity supported;Feet supported Sitting balance-Leahy Scale: Good Sitting balance - Comments: Able to perform static and dynamic seating balance for approximately 15 minutes with only occasional CGA. Oxygen saturation 95% sitting EOB with 4L O2   Standing balance support: Bilateral upper extremity supported;During functional activity;Reliant on assistive device for balance Standing balance-Leahy Scale: Poor  Standing balance comment: HEavy reliance on RW for standing but able to take side steps at EOB with +2 assistance. SpO2 decreased to 84% in standing on 4LO2                            Cognition Arousal/Alertness:  Awake/alert Behavior During Therapy: WFL for tasks assessed/performed Overall Cognitive Status: Within Functional Limits for tasks assessed                                 General Comments: More alert today and able to converse and express needs        Exercises      General Comments General comments (skin integrity, edema, etc.): Oxygen saturation with activity: Supine 92%, Seated 94%, Standing 84%      Pertinent Vitals/Pain Pain Assessment: Faces Faces Pain Scale: No hurt    Home Living                          Prior Function            PT Goals (current goals can now be found in the care plan section) Acute Rehab PT Goals Patient Stated Goal: to return back to SNF PT Goal Formulation: With patient Time For Goal Achievement: 07/08/21 Potential to Achieve Goals: Fair Progress towards PT goals: Progressing toward goals    Frequency    Min 2X/week      PT Plan Current plan remains appropriate    Co-evaluation              AM-PAC PT "6 Clicks" Mobility   Outcome Measure  Help needed turning from your back to your side while in a flat bed without using bedrails?: A Little Help needed moving from lying on your back to sitting on the side of a flat bed without using bedrails?: A Lot Help needed moving to and from a bed to a chair (including a wheelchair)?: A Lot Help needed standing up from a chair using your arms (e.g., wheelchair or bedside chair)?: A Lot Help needed to walk in hospital room?: Total Help needed climbing 3-5 steps with a railing? : Total 6 Click Score: 11    End of Session Equipment Utilized During Treatment: Gait belt;Oxygen Activity Tolerance: Patient limited by fatigue;Patient limited by pain Patient left: in bed;with call bell/phone within reach;with bed alarm set;with family/visitor present Nurse Communication: Mobility status PT Visit Diagnosis: Unsteadiness on feet (R26.81);Other abnormalities of gait and  mobility (R26.89);Repeated falls (R29.6);Muscle weakness (generalized) (M62.81);Difficulty in walking, not elsewhere classified (R26.2);History of falling (Z91.81);Pain Pain - part of body:  (Abdomen)     Time: 6712-4580 PT Time Calculation (min) (ACUTE ONLY): 31 min  Charges:  $Therapeutic Activity: 23-37 mins                     Andrey Campanile, SPT   Andrey Campanile 06/28/2021, 1:13 PM

## 2021-06-28 NOTE — Progress Notes (Addendum)
Speech Language Pathology Treatment: Dysphagia  Patient Details Name: Trevor Ferguson MRN: [id number] DOB: 05-05-1926 Today's Date: 06/28/2021 Time: 0800-0820 SLP Time Calculation (min) (ACUTE ONLY): 20 min  Assessment / Plan / Recommendation Clinical Impression  Patient seen for reassessment; remains somnolent but more arousable with stimulation (washcloth to face, oral care, conversation), requires cues to remain alert. He was able to state he had been married for 2 years, and that he met his wife in 5th grade. Allowed oral care but remains confused and unable to follow commands consistently. Accepted trials of ice chips, maintaining eyes closed. He masticated and initiated swallow response with ice chips, with no overt signs of aspiration. With teaspoons water, appears discoordinated, with delayed coughing suggestive of reduced airway protection. After approximately 10 ice chips, pt grimaced and pushed SLP's hand away; PO trials ceased. At this time pt remains confused and unable to follow commands consistently, increasing aspiration risk. Recommend NPO due to high risk for aspiration and known history of aspiration (per Barium swallow in 2020), but allowing ice chips after oral care for comfort, WHEN ALERT, with full supervision/assistance. D/w RN and MD. RN to message SLP if pt alertness, ability to follow commands improves.     HPI HPI: 87 y.o. male with diverticulitis of small intestine with perforation 5 Day Post-Op s/p small bowel resection and anastomosis, complicated by pertinent comorbidities including COPD, hypertension, stage IIIb chronic kidney disease, history of CVA, sleep apnea. Advanced to clear liquids by surgery; per RN pt not swallowing medications. Per chart review, pt with trace aspiration on barium swallow in 2020; was scheduled for OP MBS but has not completed. Wife reports some coughing with PO intake at baseline.      SLP Plan  Continue with current plan of care       Recommendations for follow up therapy are one component of a multi-disciplinary discharge planning process, led by the attending physician.  Recommendations may be updated based on patient status, additional functional criteria and insurance authorization.    Recommendations  Diet recommendations: NPO;Other(comment) (ice chips only after oral care, when alert) Medication Administration: Via alternative means Supervision: Trained caregiver to feed patient Compensations: Slow rate;Small sips/bites;Minimize environmental distractions Postural Changes and/or Swallow Maneuvers: Seated upright 90 degrees                General recommendations: Other(comment) (palliative care consult is pending) Oral Care Recommendations: Oral care QID Follow Up Recommendations: Other (comment) (tbd) Assistance recommended at discharge: Other (comment) (tbd) SLP Visit Diagnosis: Dysphagia, oropharyngeal phase (R13.12) Plan: Continue with current plan of care       [place], [place], CCC-SLP Speech-Language Pathologist  Aliene Altes  06/28/2021, 8:30 AM

## 2021-06-28 NOTE — Progress Notes (Addendum)
PROGRESS NOTE  Trevor Ferguson    DOB: 02-May-1926, 87 y.o.  ZOX:[id number]  PCP: [name], MD   Code Status: DNR   DOA: 06/21/2021   LOS: 7  Brief Narrative of Current Hospitalization  Trevor Ferguson is a 87 y.o. male with a PMH significant for COPD, HTN, HLD, sleep apnea, CKD 3B, CVA. They presented from SNF to the ED on 06/21/2021 with abdominal pain x a few days. In the ED, it was found that they had small bowel diverticulitis with possible microperforation.  General surgery was consulted. 11/4 patient underwent exploratory laparotomy with small bowel resection 11/6 postoperative ileus 11/8 having bowel movements, 3 L O2 requirement, increased lethargy 11/10 O2 requirement 8-1X, metabolic acidosis improving with bicarb gtt, unable to tolerate PO  06/28/21 -stable  Assessment & Plan  Active Problems:   Acute diverticulitis   AKI (acute kidney injury) ([place])   Diverticulitis   Hypoxia   Perforation of intestine due to diverticulitis of gastrointestinal tract   HAP (hospital-acquired pneumonia)  Acute small bowel diverticulitis- s/p resection 91/4 complicated by postoperative ileus.  Last documented stool was 11/7 -Surgery following, appreciate recommendations -Advance diet as tolerated -Zosyn per general surgery (11/8-11/10), flagyl (11/4-11/8) - gentle bowel regimen  Acute hypoxic respiratory failure 2/2 atelectasis versus early pneumonia  COPD - increased alertness today. Maintained on 4-5L. Decreased oral secretions. -continue respiratory coverage antibiotics.  Has documented penicillin allergy  - azithromycin (11/9- -Repeat chest x-ray if worsening -DuoNebs twice daily -Robinul twice daily - SLP reevaluation  Encephalopathy- unable to tolerate meds PO yesterday and will require full assistance for PO intake today. Has waxing and waning of alertness and orientation. Does not coincide with sedative medicine administration. Poor prognosis due to optimizing medical  management and no significant change in mental status/activity.  - palliative consulted.  - continue PT/OT - attempt to get out of bed and limit interference to avoid delirium.   AKI on CKD IIIb  metabolic acidosis- improving. Cr 3.7>2.9. serum bicarb 20.9, pH 7.41.  -Nephrology following, appreciate recommendations -Continue bicarb drip and discontinue once bicarb 24-28. -Monitor serum bicarb daily  Primary HTN-well-controlled with baseline low diastolic readings -Continue on Norvasc -Holding home ACE inhibitor due to kidney function  Depression -Continue home Lexapro  GERD -Continue home PPI  Chronic iron deficiency anemia -Continue home iron  DVT prophylaxis: heparin injection 5,000 Units Start: 06/27/21 0600   Diet:  Diet Orders (From admission, onward)     Start     Ordered   06/27/21 1242  Diet NPO time specified  Diet effective now        06/27/21 1241            Subjective 06/28/21    Pt alert and follows with conversation. Only verbal when directly addressed. Oriented to self and family members. Waxes and weans on orientation to location. Denies pain.   Disposition Plan & Communication  Patient status: Inpatient  Admitted From: SNF Disposition: Skilled nursing facility Anticipated discharge date: TBD  Family Communication: daughter and wife at bedside Consults, Procedures, Significant Events  Consultants:  Nephrology General surgery  Procedures/significant events:  Small bowel resection 11/4 Antimicrobials:  Anti-infectives (From admission, onward)    Start     Dose/Rate Route Frequency Ordered Stop   06/27/21 1400  azithromycin (ZITHROMAX) 250 mg in dextrose 5 % 125 mL IVPB        250 mg 125 mL/hr over 60 Minutes Intravenous Every 24 hours 06/27/21 1239  06/26/21 1800  piperacillin-tazobactam (ZOSYN) IVPB 2.25 g        2.25 g 100 mL/hr over 30 Minutes Intravenous Every 6 hours 06/26/21 1604     06/22/21 2100  ceFEPIme (MAXIPIME) 2 g in  sodium chloride 0.9 % 100 mL IVPB  Status:  Discontinued        2 g 200 mL/hr over 30 Minutes Intravenous Every 24 hours 06/21/21 2331 06/26/21 1604   06/22/21 0600  metroNIDAZOLE (FLAGYL) IVPB 500 mg  Status:  Discontinued        500 mg 100 mL/hr over 60 Minutes Intravenous Every 8 hours 06/21/21 2331 06/26/21 1604   06/21/21 2100  ceFEPIme (MAXIPIME) 2 g in sodium chloride 0.9 % 100 mL IVPB        2 g 200 mL/hr over 30 Minutes Intravenous  Once 06/21/21 2050 06/21/21 2130   06/21/21 2100  metroNIDAZOLE (FLAGYL) IVPB 500 mg        500 mg 100 mL/hr over 60 Minutes Intravenous  Once 06/21/21 2050 06/22/21 1616       Objective   Vitals:   06/28/21 0018 06/28/21 0327 06/28/21 0731 06/28/21 0737  BP: 139/70 (!) 153/64 (!) 169/67   Pulse: 95 98 86   Resp: 18 18 19    Temp: 98.3 F (36.8 C) 98.6 F (37 C) 98.6 F (37 C)   TempSrc:      SpO2: (!) 87% 97% 98% 95%  Weight:      Height:        Intake/Output Summary (Last 24 hours) at 06/28/2021 0738 Last data filed at 06/28/2021 0044 Gross per 24 hour  Intake 3273.3 ml  Output 1750 ml  Net 1523.3 ml    Filed Weights   06/22/21 2134 06/26/21 1549 06/27/21 0458  Weight: 96.2 kg 103.6 kg 104.4 kg    Patient BMI: Body mass index is 34.49 kg/m.   Physical Exam: General: Awake, NAD HEENT: Dry mucous membranes Respiratory: normal respiratory effort.  Mild rales. Negative wheezing.  Cardiovascular: normal S1/S2, RRR, no JVD, murmurs, rubs, gallops, quick capillary refill  Gastrointestinal: soft, NT, ND, no HSM felt Nervous: Alert and oriented to self consistently. Able to follow simple commands. Extremities: Trace lower extremity edema Skin: dry, intact, normal temperature, normal color, No rashes, lesions or ulcers on exposed skin  Labs   I have personally reviewed following labs and imaging studies BMP Latest Ref Rng & Units 06/28/2021 06/27/2021 06/26/2021  Glucose 70 - 99 mg/dL 135(H) 140(H) 109(H)  BUN 8 - 23 mg/dL 50(H)  61(H) 62(H)  Creatinine 0.61 - 1.24 mg/dL 2.98(H) 3.71(H) 3.75(H)  Sodium 135 - 145 mmol/L 133(L) 132(L) 130(L)  Potassium 3.5 - 5.1 mmol/L 3.7 4.2 4.8  Chloride 98 - 111 mmol/L 105 105 104  CO2 22 - 32 mmol/L 21(L) 19(L) 17(L)  Calcium 8.9 - 10.3 mg/dL 8.0(L) 7.9(L) 7.9(L)   CBC    Component Value Date/Time   WBC 17.6 (H) 06/28/2021 0323   RBC 3.14 (L) 06/28/2021 0323   HGB 9.5 (L) 06/28/2021 0323   HGB 12.8 (L) 03/19/2014 1049   HCT 28.5 (L) 06/28/2021 0323   HCT 37.7 (L) 03/19/2014 1049   PLT 243 06/28/2021 0323   PLT 137 (L) 03/19/2014 1049   MCV 90.8 06/28/2021 0323   MCV 97 03/19/2014 1049   MCH 30.3 06/28/2021 0323   MCHC 33.3 06/28/2021 0323   RDW 14.8 06/28/2021 0323   RDW 13.2 03/19/2014 1049   LYMPHSABS 0.5 (L) 06/27/2021 2595  MONOABS 1.6 (H) 06/27/2021 0334   EOSABS 0.1 06/27/2021 0334   BASOSABS 0.1 06/27/2021 0334    Imaging Studies  No results found. Medications   Scheduled Meds:  acidophilus  2 capsule Oral TID   amLODipine  5 mg Oral Daily   benzonatate  200 mg Oral TID   budesonide (PULMICORT) nebulizer solution  0.5 mg Nebulization Daily   Chlorhexidine Gluconate Cloth  6 each Topical Daily   cholecalciferol  1,000 Units Oral Daily   escitalopram  10 mg Oral Daily   famotidine  10 mg Oral BID   feeding supplement  237 mL Oral TID BM   ferrous sulfate  325 mg Oral Q breakfast   glycopyrrolate  1 mg Oral BID   heparin injection (subcutaneous)  5,000 Units Subcutaneous Q8H   mouth rinse  15 mL Mouth Rinse BID   multivitamin with minerals  1 tablet Oral Daily   traZODone  150 mg Oral QHS   No recently discontinued medications to reconcile  LOS: 7 days   Time spent: >17min  Richarda Osmond, DO [facility] 06/28/2021, 7:38 AM   Please refer to amion to contact the [facility] Attending or Consulting provider for this pt  [email or web address] Available by Epic secure chat 7AM-7PM. If 7PM-7AM, please contact night-coverage

## 2021-06-28 NOTE — Progress Notes (Signed)
Patient ID: Trevor Ferguson, male   DOB: November 30, 1925, 87 y.o.   MRN: [id number]     [place] Hospital Day(s): 7.   Interval History: Patient seen and examined, no acute events or new complaints overnight. Patient reports feeling uncomfortable but not able to explain why. Patient was basically sleeping when I enter the room. He is arousable but does not follow a long conversation.  As per nurse no evidence of bowel movement. No nausea or vomiting either.   Vital signs in last 24 hours: [min-max] current  Temp:  [98.3 F (36.8 C)-98.8 F (37.1 C)] 98.6 F (37 C) (11/10 0731) Pulse Rate:  [71-98] 86 (11/10 0731) Resp:  [17-19] 19 (11/10 0731) BP: (122-169)/(60-73) 169/67 (11/10 0731) SpO2:  [87 %-100 %] 95 % (11/10 0737)     Height: 5' 8.5" (174 cm) Weight: 104.4 kg BMI (Calculated): 34.48   Physical Exam:  Constitutional:  asleep but alert to voice Respiratory: breathing non-labored at rest while sleeping Cardiovascular: regular rate and sinus rhythm  Gastrointestinal: soft, non-tender, and non-distended. Wound is dry and clean.   Labs:  CBC Latest Ref Rng & Units 06/28/2021 06/27/2021 06/26/2021  WBC 4.0 - 10.5 K/uL 17.6(H) 12.1(H) 14.6(H)  Hemoglobin 13.0 - 17.0 g/dL 9.5(L) 8.8(L) 9.3(L)  Hematocrit 39.0 - 52.0 % 28.5(L) 26.7(L) 28.9(L)  Platelets 150 - 400 K/uL 243 213 231   CMP Latest Ref Rng & Units 06/28/2021 06/27/2021 06/26/2021  Glucose 70 - 99 mg/dL 135(H) 140(H) 109(H)  BUN 8 - 23 mg/dL 50(H) 61(H) 62(H)  Creatinine 0.61 - 1.24 mg/dL 2.98(H) 3.71(H) 3.75(H)  Sodium 135 - 145 mmol/L 133(L) 132(L) 130(L)  Potassium 3.5 - 5.1 mmol/L 3.7 4.2 4.8  Chloride 98 - 111 mmol/L 105 105 104  CO2 22 - 32 mmol/L 21(L) 19(L) 17(L)  Calcium 8.9 - 10.3 mg/dL 8.0(L) 7.9(L) 7.9(L)  Total Protein 6.5 - 8.1 g/dL - - -  Total Bilirubin 0.3 - 1.2 mg/dL - - -  Alkaline Phos 38 - 126 U/L - - -  AST 15 - 41 U/L - - -  ALT 0 - 44 U/L - - -    Imaging studies: No new pertinent  imaging studies   Assessment/Plan:  87 y.o. male with diverticulitis of small intestine with perforation 5 Day Post-Op s/p small bowel resection and anastomosis, complicated by pertinent comorbidities including COPD, hypertension, stage IIIb chronic kidney disease, history of CVA, sleep apnea.  Patient this morning continue with clinical change. He continue to be asleep most of the time.   From the diverticulitis standpoint, this was resolved with resection of the intestine. He completed 5 days of IV abx therapy. Agree to discontinue Zosyn from the diverticulitis standpoint.   Last CT scan 2 days ago without any sign of intra abdominal complication. From my standpoint patient can try full liquid diet if he is able to be alert and if speech and swallow therapist agree after complete evaluation.   I will continue to follow. Education officer, museum for the management of medical comorbilities.   Arnold Long, MD

## 2021-06-28 NOTE — Progress Notes (Signed)
Palliative-   Chart reviewed.  Ware meeting with patient's daughter and spouse scheduled for tomorrow 11/10 at 79 am.   Mariana Kaufman, [facility] Palliative Medicine  Please call Palliative Medicine team phone with any questions [phone]. For individual providers please see AMION.  No charge

## 2021-06-28 NOTE — Progress Notes (Signed)
Speech Language Pathology Treatment: Dysphagia  Patient Details Name: Trevor Ferguson MRN: [id number] DOB: 01-Mar-1926 Today's Date: 06/28/2021 Time: 1100-1140 SLP Time Calculation (min) (ACUTE ONLY): 40 min  Assessment / Plan / Recommendation Clinical Impression  Patient seen for reassessment at request of RN, family. Per RN, pt tolerating ice chips well and has sustained alertness, was able to work with PT this morning. Pt alert and conversing with wife, daughter upon SLP arrival. Answers simple questions appropriately and follows basic commands. Patient was able to self-feed sips of thin liquid by cup and teaspoons of applesauce with set-up assistance and min cues for rest breaks, given dyspnea with exertion. Swallow response appears timely. Intermittent congested coughing at baseline, not exacerbated by POs. Answered questions from pt's wife and daughter, and educated extensively on aspiration risk in context of acute illness, fluctuating mentation, and increased O2 needs. Reinforced importance of precautions to reduce aspiration risk, including supervision to ensure adequate alertness, allowing pt to self-feed as able, providing rest breaks to ensure swallow-breathing reciprocity, upright positioning, small sips of liquid, no straws. Recommend initiating full liquids, no straws, meds crushed with above precautions. Hold POs if pt not alert or following commands. Precautions posted at head of bed, discussed with RN via secure chat.     HPI HPI: 87 y.o. male with diverticulitis of small intestine with perforation 5 Day Post-Op s/p small bowel resection and anastomosis, complicated by pertinent comorbidities including COPD, hypertension, stage IIIb chronic kidney disease, history of CVA, sleep apnea. Advanced to clear liquids by surgery; per RN pt not swallowing medications. Per chart review, pt with trace aspiration on barium swallow in 2020; was scheduled for OP MBS but has not completed. Wife reports  some coughing with PO intake at baseline.      SLP Plan  Continue with current plan of care (adv to full liquids with supervision)      Recommendations for follow up therapy are one component of a multi-disciplinary discharge planning process, led by the attending physician.  Recommendations may be updated based on patient status, additional functional criteria and insurance authorization.    Recommendations  Diet recommendations: Thin liquid Liquids provided via: Cup;No straw Medication Administration: Crushed with puree Supervision: Full supervision/cueing for compensatory strategies Compensations: Slow rate;Small sips/bites;Minimize environmental distractions Postural Changes and/or Swallow Maneuvers: Seated upright 90 degrees                General recommendations: Other(comment) (tbd) Oral Care Recommendations: Oral care QID Follow Up Recommendations: Other (comment) (tbd) Assistance recommended at discharge: Other (comment) (tbd) SLP Visit Diagnosis: Dysphagia, oropharyngeal phase (R13.12) Plan: Continue with current plan of care (adv to full liquids with supervision)       [place], [place], CCC-SLP Speech-Language Pathologist   Aliene Altes  06/28/2021, 11:50 AM

## 2021-06-29 DIAGNOSIS — Z515 Encounter for palliative care: Secondary | ICD-10-CM

## 2021-06-29 DIAGNOSIS — Z7189 Other specified counseling: Secondary | ICD-10-CM

## 2021-06-29 LAB — BLOOD GAS, VENOUS
Acid-Base Excess: 0.8 mmol/L (ref 0.0–2.0)
Bicarbonate: 26.6 mmol/L (ref 20.0–28.0)
O2 Saturation: 96.7 %
Patient temperature: 37
pCO2, Ven: 47 mmHg (ref 44.0–60.0)
pH, Ven: 7.36 (ref 7.250–7.430)
pO2, Ven: 91 mmHg — ABNORMAL HIGH (ref 32.0–45.0)

## 2021-06-29 LAB — BASIC METABOLIC PANEL
Anion gap: 5 (ref 5–15)
BUN: 43 mg/dL — ABNORMAL HIGH (ref 8–23)
CO2: 26 mmol/L (ref 22–32)
Calcium: 8.1 mg/dL — ABNORMAL LOW (ref 8.9–10.3)
Chloride: 105 mmol/L (ref 98–111)
Creatinine, Ser: 2.43 mg/dL — ABNORMAL HIGH (ref 0.61–1.24)
GFR, Estimated: 24 mL/min — ABNORMAL LOW (ref >60)
Glucose, Bld: 136 mg/dL — ABNORMAL HIGH (ref 70–99)
Potassium: 3.2 mmol/L — ABNORMAL LOW (ref 3.5–5.1)
Sodium: 136 mmol/L (ref 135–145)

## 2021-06-29 MED ORDER — POTASSIUM CITRATE-CITRIC ACID 1100-334 MG/5ML PO SOLN: 10.0000 meq | Freq: Three times a day (TID) | ORAL | Status: DC

## 2021-06-29 MED ORDER — POTASSIUM CHLORIDE 20 MEQ PO PACK
20.0000 meq | PACK | Freq: Two times a day (BID) | ORAL | Status: AC
  Administered 2021-06-29 (×2): 20 meq via ORAL
  Filled 2021-06-29 (×2): qty 1

## 2021-06-29 NOTE — Progress Notes (Signed)
Patient ID: Trevor Ferguson, male   DOB: 1925/11/10, 87 y.o.   MRN: [id number]     [place] Hospital Day(s): 8.   Interval History: Patient seen and examined, no acute events or new complaints overnight. Patient reports she had a small vomit this morning after breakfast.  Patient also endorses that he had a good bowel movement this morning.  Patient has not been able to eat lunch yet.  Vital signs in last 24 hours: [min-max] current  Temp:  [97.6 F (36.4 C)-98.5 F (36.9 C)] 97.8 F (36.6 C) (11/11 1349) Pulse Rate:  [63-91] 75 (11/11 1349) Resp:  [17-20] 20 (11/11 1349) BP: (133-152)/(53-71) 133/69 (11/11 1349) SpO2:  [89 %-98 %] 97 % (11/11 1349)     Height: 5' 8.5" (174 cm) Weight: 104.4 kg BMI (Calculated): 34.48   Physical Exam:  Constitutional: alert, cooperative and no distress  Respiratory: breathing non-labored at rest  Cardiovascular: regular rate and sinus rhythm  Gastrointestinal: soft, non-tender, and non-distended.  Wound is dry and clean  Labs:  CBC Latest Ref Rng & Units 06/28/2021 06/27/2021 06/26/2021  WBC 4.0 - 10.5 K/uL 17.6(H) 12.1(H) 14.6(H)  Hemoglobin 13.0 - 17.0 g/dL 9.5(L) 8.8(L) 9.3(L)  Hematocrit 39.0 - 52.0 % 28.5(L) 26.7(L) 28.9(L)  Platelets 150 - 400 K/uL 243 213 231   CMP Latest Ref Rng & Units 06/29/2021 06/28/2021 06/27/2021  Glucose 70 - 99 mg/dL 136(H) 135(H) 140(H)  BUN 8 - 23 mg/dL 43(H) 50(H) 61(H)  Creatinine 0.61 - 1.24 mg/dL 2.43(H) 2.98(H) 3.71(H)  Sodium 135 - 145 mmol/L 136 133(L) 132(L)  Potassium 3.5 - 5.1 mmol/L 3.2(L) 3.7 4.2  Chloride 98 - 111 mmol/L 105 105 105  CO2 22 - 32 mmol/L 26 21(L) 19(L)  Calcium 8.9 - 10.3 mg/dL 8.1(L) 8.0(L) 7.9(L)  Total Protein 6.5 - 8.1 g/dL - - -  Total Bilirubin 0.3 - 1.2 mg/dL - - -  Alkaline Phos 38 - 126 U/L - - -  AST 15 - 41 U/L - - -  ALT 0 - 44 U/L - - -    Imaging studies: No new pertinent imaging studies   Assessment/Plan:  87 y.o. male with diverticulitis of  small intestine with perforation 7 Day Post-Op s/p small bowel resection and anastomosis, complicated by pertinent comorbidities including COPD, hypertension, stage IIIb chronic kidney disease, history of CVA, sleep apnea.  Patient today without any significant change on his clinical status.  He had a good bowel movement but also had a small vomit.  Difficult to assess if this was because of ileus versus because of the chronic cough.  We will try lunch with full liquid diet.  Patient may be advanced if he tolerates.  No indication for physical therapy from surgical standpoint.  Appreciate hospitalist and consultant for the management of medical comorbidities.  I will continue to follow closely.  Arnold Long, MD

## 2021-06-29 NOTE — Progress Notes (Signed)
[facility]  ROUNDING NOTE   Subjective:   Trevor Ferguson is a 87 year old male with past medical history of hypertension, sleep apnea, COPD, CVA, and chronic kidney disease stage IIIb.  Patient presents to the emergency room from his assisted living facility with complaints of right upper quadrant pain, nausea and vomiting.  He has been admitted for Diverticulitis [K57.92] AKI (acute kidney injury) ([place]) [N17.9] Acute diverticulitis [K57.92] Perforation of intestine due to diverticulitis of gastrointestinal tract [K57.80]  Patient is known to our practice and sees Dr. Juleen China in office.  Patient was seen in office by Dr. Juleen China on April 16, 2021.    Renal function improved.  Creatinine down to 2.43 with urine output of 1.7 L over the preceding 24 hours. Patient appears to be resting comfortably at the moment.    Objective:  Vital signs in last 24 hours:  Temp:  [97.6 F (36.4 C)-98.5 F (36.9 C)] 97.6 F (36.4 C) (11/11 0816) Pulse Rate:  [76-92] 76 (11/11 0816) Resp:  [17-19] 17 (11/11 0816) BP: (135-152)/(53-83) 146/62 (11/11 0816) SpO2:  [89 %-97 %] 95 % (11/11 0816)  Weight change:  Filed Weights   06/22/21 2134 06/26/21 1549 06/27/21 0458  Weight: 96.2 kg 103.6 kg 104.4 kg    Intake/Output: I/O last 3 completed shifts: In: 1915 [P.O.:1140; I.V.:720; IV Piggyback:55] Out: 2500 [Urine:2500]   Intake/Output this shift:  Total I/O In: 340 [P.O.:340] Out: 500 [Urine:500]  Physical Exam: General: NAD  Head: Normocephalic, atraumatic. Moist oral mucosal membranes  Eyes: Anicteric  Lungs:  Clear to auscultation, normal effort, [place] O2  Heart: Regular rate and rhythm  Abdomen:  Soft, nontender, nondistended  Extremities: Trace peripheral edema.  Neurologic: Nonfocal, moving all four extremities  Skin: No lesions       Basic Metabolic Panel: Recent Labs  Lab 06/23/21 0439 06/25/21 0510 06/26/21 0429 06/26/21 1635 06/27/21 0334 06/28/21 0323  06/29/21 0426  NA 134*   < > 130* 130* 132* 133* 136  K 4.7   < > 4.5 4.8 4.2 3.7 3.2*  CL 105   < > 105 104 105 105 105  CO2 21*   < > 16* 17* 19* 21* 26  GLUCOSE 160*   < > 107* 109* 140* 135* 136*  BUN 53*   < > 59* 62* 61* 50* 43*  CREATININE 2.51*   < > 3.49* 3.75* 3.71* 2.98* 2.43*  CALCIUM 8.0*   < > 7.7* 7.9* 7.9* 8.0* 8.1*  MG 2.2  --   --   --   --   --   --   PHOS 5.4*  --   --   --   --   --   --    < > = values in this interval not displayed.     Liver Function Tests: No results for input(s): AST, ALT, ALKPHOS, BILITOT, PROT, ALBUMIN in the last 168 hours.  No results for input(s): LIPASE, AMYLASE in the last 168 hours.  No results for input(s): AMMONIA in the last 168 hours.  CBC: Recent Labs  Lab 06/25/21 0510 06/26/21 0429 06/26/21 1635 06/27/21 0334 06/28/21 0323  WBC 11.4* 12.2* 14.6* 12.1* 17.6*  NEUTROABS 8.5* 9.2* 11.2* 9.5*  --   HGB 8.7* 8.9* 9.3* 8.8* 9.5*  HCT 26.4* 27.9* 28.9* 26.7* 28.5*  MCV 96.4 96.2 96.0 92.7 90.8  PLT 211 206 231 213 243     Cardiac Enzymes: No results for input(s): CKTOTAL, CKMB, CKMBINDEX, TROPONINI in the  last 168 hours.  BNP: Invalid input(s): POCBNP  CBG: Recent Labs  Lab 06/27/21 0730  GLUCAP 150*     Microbiology: Results for orders placed or performed during the hospital encounter of 06/21/21  Resp Panel by RT-PCR (Flu A&B, Covid) Nasopharyngeal Swab     Status: None   Collection Time: 06/21/21  8:38 PM   Specimen: Nasopharyngeal Swab; Nasopharyngeal(NP) swabs in vial transport medium  Result Value Ref Range Status   SARS Coronavirus 2 by RT PCR NEGATIVE NEGATIVE Final    Comment: (NOTE) SARS-CoV-2 target nucleic acids are NOT DETECTED.  The SARS-CoV-2 RNA is generally detectable in upper respiratory specimens during the acute phase of infection. The lowest concentration of SARS-CoV-2 viral copies this assay can detect is 138 copies/mL. A negative result does not preclude SARS-Cov-2 infection  and should not be used as the sole basis for treatment or other patient management decisions. A negative result may occur with  improper specimen collection/handling, submission of specimen other than nasopharyngeal swab, presence of viral mutation(s) within the areas targeted by this assay, and inadequate number of viral copies(<138 copies/mL). A negative result must be combined with clinical observations, patient history, and epidemiological information. The expected result is Negative.  Fact Sheet for Patients:  EntrepreneurPulse.com.au  Fact Sheet for Healthcare Providers:  IncredibleEmployment.be  This test is no t yet approved or cleared by the [place] FDA and  has been authorized for detection and/or diagnosis of SARS-CoV-2 by FDA under an Emergency Use Authorization (EUA). This EUA will remain  in effect (meaning this test can be used) for the duration of the COVID-19 declaration under Section 564(b)(1) of the Act, 21 U.S.C.section 360bbb-3(b)(1), unless the authorization is terminated  or revoked sooner.       Influenza A by PCR NEGATIVE NEGATIVE Final   Influenza B by PCR NEGATIVE NEGATIVE Final    Comment: (NOTE) The Xpert Xpress SARS-CoV-2/FLU/RSV plus assay is intended as an aid in the diagnosis of influenza from Nasopharyngeal swab specimens and should not be used as a sole basis for treatment. Nasal washings and aspirates are unacceptable for Xpert Xpress SARS-CoV-2/FLU/RSV testing.  Fact Sheet for Patients: EntrepreneurPulse.com.au  Fact Sheet for Healthcare Providers: IncredibleEmployment.be  This test is not yet approved or cleared by the [place] FDA and has been authorized for detection and/or diagnosis of SARS-CoV-2 by FDA under an Emergency Use Authorization (EUA). This EUA will remain in effect (meaning this test can be used) for the duration of the COVID-19 declaration  under Section 564(b)(1) of the Act, 21 U.S.C. section 360bbb-3(b)(1), unless the authorization is terminated or revoked.  Performed at [facility], [address] [place], [place] [address]   CULTURE, BLOOD (ROUTINE X 2) w Reflex to ID Panel     Status: None (Preliminary result)   Collection Time: 06/26/21  8:11 PM   Specimen: BLOOD  Result Value Ref Range Status   Specimen Description BLOOD RIGHT ANTECUBITAL  Final   Special Requests   Final    BOTTLES DRAWN AEROBIC AND ANAEROBIC Blood Culture adequate volume   Culture   Final    NO GROWTH 3 DAYS Performed at [facility], [address] [place], [place] [address]    Report Status PENDING  Incomplete  CULTURE, BLOOD (ROUTINE X 2) w Reflex to ID Panel     Status: None (Preliminary result)   Collection Time: 06/26/21  8:30 PM   Specimen: BLOOD  Result Value Ref Range Status   Specimen  Description BLOOD BLOOD RIGHT FOREARM  Final   Special Requests   Final    BOTTLES DRAWN AEROBIC AND ANAEROBIC Blood Culture results may not be optimal due to an excessive volume of blood received in culture bottles   Culture   Final    NO GROWTH 3 DAYS Performed at [facility], [address] [place], [place] [address]    Report Status PENDING  Incomplete    Coagulation Studies: No results for input(s): LABPROT, INR in the last 72 hours.  Urinalysis: No results for input(s): COLORURINE, LABSPEC, PHURINE, GLUCOSEU, HGBUR, BILIRUBINUR, KETONESUR, PROTEINUR, UROBILINOGEN, NITRITE, LEUKOCYTESUR in the last 72 hours.  Invalid input(s): APPERANCEUR     Imaging: No results found.   Medications:    sodium chloride 10 mL/hr at 06/26/21 1905   azithromycin 250 mg (06/28/21 1459)    acidophilus  2 capsule Oral TID   amLODipine  5 mg Oral Daily   benzonatate  200 mg Oral TID   budesonide (PULMICORT) nebulizer solution  0.5 mg Nebulization Daily   Chlorhexidine Gluconate Cloth  6 each Topical Daily    cholecalciferol  1,000 Units Oral Daily   escitalopram  10 mg Oral Daily   famotidine  10 mg Oral BID   feeding supplement  237 mL Oral TID BM   ferrous sulfate  325 mg Oral Q breakfast   heparin injection (subcutaneous)  5,000 Units Subcutaneous Q8H   mouth rinse  15 mL Mouth Rinse BID   multivitamin with minerals  1 tablet Oral Daily   traZODone  150 mg Oral QHS   sodium chloride, acetaminophen **OR** acetaminophen, albuterol, guaiFENesin-dextromethorphan, lactulose, menthol-cetylpyridinium, morphine injection, ondansetron **OR** ondansetron (ZOFRAN) IV, oxyCODONE-acetaminophen, simethicone  Assessment/ Plan:  Mr. TRINITY HYLAND is a 87 y.o.  male with past medical history of hypertension, sleep apnea, COPD, CVA, and chronic kidney disease stage IIIb.  Patient presents to the emergency room from his assisted living facility with complaints of right upper quadrant pain, nausea and vomiting.  He has been admitted for Diverticulitis [K57.92] AKI (acute kidney injury) ([place]) [N17.9] Acute diverticulitis [K57.92] Perforation of intestine due to diverticulitis of gastrointestinal tract [K57.80]   Acute Kidney Injury on chronic kidney disease stage 3b with baseline creatinine 1.8 and GFR of 35 on 03/28/21.  Acute kidney injury secondary to prolonged poor oral intake vs infectious process and due to bowel perforation Chronic kidney disease secondary to hypertension, NSAIDs, and diabetes Renal ultrasound shows right renal calculus and left renal cyst No IV contrast exposure.   Avoid nephrotoxic agents and therapies, if possible.    Creatinine down to 2.43.  Good urine output noted.  No immediate need for dialysis.  Monitor renal parameters daily.  Lab Results  Component Value Date   CREATININE 2.43 (H) 06/29/2021   CREATININE 2.98 (H) 06/28/2021   CREATININE 3.71 (H) 06/27/2021    Intake/Output Summary (Last 24 hours) at 06/29/2021 1058 Last data filed at 06/29/2021 0940 Gross per 24 hour   Intake 2134.97 ml  Output 2200 ml  Net -65.03 ml    2.  Hypertension with chronic kidney disease.  Home regimen includes amlodipine, losartan-hydrochlorothiazide, and torsemide. Receiving amlodipine.  BP currently 146/62.     LOS: 8 Lyndy Russman 11/11/202210:58 AM

## 2021-06-29 NOTE — Anesthesia Postprocedure Evaluation (Signed)
Anesthesia Post Note  Patient: Trevor Ferguson  Procedure(s) Performed: EXPLORATORY LAPAROTOMY SMALL BOWEL RESECTION  Patient location during evaluation: PACU Anesthesia Type: General Level of consciousness: awake and alert Pain management: pain level controlled Vital Signs Assessment: post-procedure vital signs reviewed and stable Respiratory status: spontaneous breathing, nonlabored ventilation, respiratory function stable and patient connected to nasal cannula oxygen Cardiovascular status: blood pressure returned to baseline and stable Postop Assessment: no apparent nausea or vomiting Anesthetic complications: no   No notable events documented.   Last Vitals:  Vitals:   06/29/21 1349 06/29/21 1512  BP: 133/69 (!) 154/63  Pulse: 75 66  Resp: 20 20  Temp: 36.6 C 36.7 C  SpO2: 97% 97%    Last Pain:  Vitals:   06/29/21 1512  TempSrc: Axillary  PainSc:                  Martha Clan

## 2021-06-29 NOTE — Progress Notes (Signed)
PROGRESS NOTE  Trevor Ferguson    DOB: 1926/03/25, 87 y.o.  DVV:[id number]  PCP: [name], MD   Code Status: DNR   DOA: 06/21/2021   LOS: 8  Brief Narrative of Current Hospitalization  Trevor Ferguson is a 87 y.o. male with a PMH significant for COPD, HTN, HLD, sleep apnea, CKD 3B, CVA. They presented from SNF to the ED on 06/21/2021 with abdominal pain x a few days. In the ED, it was found that they had small bowel diverticulitis with possible microperforation.  General surgery was consulted. 11/4 patient underwent exploratory laparotomy with small bowel resection 11/6 postoperative ileus 11/8 having bowel movements, 3 L O2 requirement, increased lethargy 11/10 O2 requirement 6-2I, metabolic acidosis improving with bicarb gtt, unable to tolerate PO  06/29/21 -stable  Assessment & Plan  Active Problems:   Acute diverticulitis   AKI (acute kidney injury) ([place])   Diverticulitis   Hypoxia   Perforation of intestine due to diverticulitis of gastrointestinal tract   HAP (hospital-acquired pneumonia)   Abdominal distension (gaseous)  Acute small bowel diverticulitis- s/p resection 94/8 complicated by postoperative ileus.  Last documented stool was 11/11 -Surgery following, appreciate recommendations -Advance diet as tolerated and per SLP -Zosyn per general surgery (11/8-11/10), flagyl (11/4-11/8) - gentle bowel regimen  Acute hypoxic respiratory failure 2/2 atelectasis versus early pneumonia  COPD - stable alertness today. Maintained on 4-5L. Decreased oral secretions. -continue respiratory coverage antibiotics.  Has documented penicillin allergy  - azithromycin (11/9- -Repeat chest x-ray if worsening -DuoNebs twice daily -Robinul twice daily - SLP reevaluation  Encephalopathy- oriented to self only. Limited PO intake. No significant improvements in mental status with resolution of acidosis.  - palliative consulted.   - meeting with family today to discuss [place] -  continue PT/OT - attempt to get out of bed and limit interference to avoid delirium.   AKI on CKD IIIb  metabolic acidosis  mild hypokalemia- improving. Cr 3.7>2.9>2.43. serum bicarb 26.6, pH 7.36. K+ 3.2 -Nephrology following, appreciate recommendations -discontinued bicarb drip -Monitor serum bicarb daily, BMP am - replete electrolytes PRN  Primary HTN-well-controlled with baseline low diastolic readings -Continue on Norvasc -Holding home ACE inhibitor due to kidney function  Depression -Continue home Lexapro  GERD -Continue home PPI  Chronic iron deficiency anemia -Continue home iron  DVT prophylaxis: heparin injection 5,000 Units Start: 06/27/21 0600   Diet:  Diet Orders (From admission, onward)     Start     Ordered   06/28/21 1136  Diet full liquid Room service appropriate? Yes; Fluid consistency: Thin  Diet effective now       Comments: NO STRAWS  Question Answer Comment  Room service appropriate? Yes   Fluid consistency: Thin      06/28/21 1138            Subjective 06/29/21    Pt reports no concerns today. When asked how he is doing, he replies, "I'm not quite sure". He denies pain or shortness of breath. Unsure of his last BM.   Disposition Plan & Communication  Patient status: Inpatient  Admitted From: SNF Disposition: Skilled nursing facility Anticipated discharge date: TBD  Family Communication: daughter and wife at bedside Consults, Procedures, Significant Events  Consultants:  Nephrology General surgery Palliative   Procedures/significant events:  Small bowel resection 11/4 Antimicrobials:  Anti-infectives (From admission, onward)    Start     Dose/Rate Route Frequency Ordered Stop   06/27/21 1400  azithromycin (ZITHROMAX) 250 mg in dextrose  5 % 125 mL IVPB        250 mg 125 mL/hr over 60 Minutes Intravenous Every 24 hours 06/27/21 1239     06/26/21 1800  piperacillin-tazobactam (ZOSYN) IVPB 2.25 g  Status:  Discontinued         2.25 g 100 mL/hr over 30 Minutes Intravenous Every 6 hours 06/26/21 1604 06/28/21 0748   06/22/21 2100  ceFEPIme (MAXIPIME) 2 g in sodium chloride 0.9 % 100 mL IVPB  Status:  Discontinued        2 g 200 mL/hr over 30 Minutes Intravenous Every 24 hours 06/21/21 2331 06/26/21 1604   06/22/21 0600  metroNIDAZOLE (FLAGYL) IVPB 500 mg  Status:  Discontinued        500 mg 100 mL/hr over 60 Minutes Intravenous Every 8 hours 06/21/21 2331 06/26/21 1604   06/21/21 2100  ceFEPIme (MAXIPIME) 2 g in sodium chloride 0.9 % 100 mL IVPB        2 g 200 mL/hr over 30 Minutes Intravenous  Once 06/21/21 2050 06/21/21 2130   06/21/21 2100  metroNIDAZOLE (FLAGYL) IVPB 500 mg        500 mg 100 mL/hr over 60 Minutes Intravenous  Once 06/21/21 2050 06/22/21 1616       Objective   Vitals:   06/29/21 0300 06/29/21 0310 06/29/21 0328 06/29/21 0435  BP:    135/71  Pulse:    85  Resp:    18  Temp:    98.5 F (36.9 C)  TempSrc:    Oral  SpO2: (!) 89% 94% 96% 97%  Weight:      Height:        Intake/Output Summary (Last 24 hours) at 06/29/2021 0722 Last data filed at 06/29/2021 0600 Gross per 24 hour  Intake 1914.97 ml  Output 1700 ml  Net 214.97 ml    Filed Weights   06/22/21 2134 06/26/21 1549 06/27/21 0458  Weight: 96.2 kg 103.6 kg 104.4 kg    Patient BMI: Body mass index is 34.49 kg/m.   Physical Exam: General: Awake, NAD HEENT: moist mucous membranes Respiratory: normal respiratory effort. Mild rales. positive wheezing.  Cardiovascular: normal S1/S2, RRR, no JVD, murmurs, rubs, gallops, quick capillary refill  Gastrointestinal: soft, NT, firm, no HSM felt Nervous: Alert and oriented to self consistently. Able to follow simple commands. Extremities: Trace lower extremity edema Skin: dry, intact, normal temperature, normal color, No rashes, lesions or ulcers on exposed skin  Labs   I have personally reviewed following labs and imaging studies BMP Latest Ref Rng & Units 06/29/2021  06/28/2021 06/27/2021  Glucose 70 - 99 mg/dL 136(H) 135(H) 140(H)  BUN 8 - 23 mg/dL 43(H) 50(H) 61(H)  Creatinine 0.61 - 1.24 mg/dL 2.43(H) 2.98(H) 3.71(H)  Sodium 135 - 145 mmol/L 136 133(L) 132(L)  Potassium 3.5 - 5.1 mmol/L 3.2(L) 3.7 4.2  Chloride 98 - 111 mmol/L 105 105 105  CO2 22 - 32 mmol/L 26 21(L) 19(L)  Calcium 8.9 - 10.3 mg/dL 8.1(L) 8.0(L) 7.9(L)   CBC    Component Value Date/Time   WBC 17.6 (H) 06/28/2021 0323   RBC 3.14 (L) 06/28/2021 0323   HGB 9.5 (L) 06/28/2021 0323   HGB 12.8 (L) 03/19/2014 1049   HCT 28.5 (L) 06/28/2021 0323   HCT 37.7 (L) 03/19/2014 1049   PLT 243 06/28/2021 0323   PLT 137 (L) 03/19/2014 1049   MCV 90.8 06/28/2021 0323   MCV 97 03/19/2014 1049   MCH 30.3 06/28/2021 0323  MCHC 33.3 06/28/2021 0323   RDW 14.8 06/28/2021 0323   RDW 13.2 03/19/2014 1049   LYMPHSABS 0.5 (L) 06/27/2021 0334   MONOABS 1.6 (H) 06/27/2021 0334   EOSABS 0.1 06/27/2021 0334   BASOSABS 0.1 06/27/2021 0334    Imaging Studies  No results found. Medications   Scheduled Meds:  acidophilus  2 capsule Oral TID   amLODipine  5 mg Oral Daily   benzonatate  200 mg Oral TID   budesonide (PULMICORT) nebulizer solution  0.5 mg Nebulization Daily   Chlorhexidine Gluconate Cloth  6 each Topical Daily   cholecalciferol  1,000 Units Oral Daily   escitalopram  10 mg Oral Daily   famotidine  10 mg Oral BID   feeding supplement  237 mL Oral TID BM   ferrous sulfate  325 mg Oral Q breakfast   glycopyrrolate  1 mg Oral BID   heparin injection (subcutaneous)  5,000 Units Subcutaneous Q8H   mouth rinse  15 mL Mouth Rinse BID   multivitamin with minerals  1 tablet Oral Daily   traZODone  150 mg Oral QHS   No recently discontinued medications to reconcile  LOS: 8 days   Time spent: >59min  Richarda Osmond, DO [facility] 06/29/2021, 7:22 AM   Please refer to amion to contact the [facility] Attending or Consulting provider for this pt  [email or web address] Available by  Epic secure chat 7AM-7PM. If 7PM-7AM, please contact night-coverage

## 2021-06-29 NOTE — Consult Note (Signed)
Consultation Note Date: 06/29/2021   Patient Name: Trevor Ferguson  DOB: 1926/05/06  MRN: [id number]  Age / Sex: 87 y.o., male  PCP: [name], MD Referring Physician: Richarda Osmond, MD  Reason for Consultation:   HPI/Patient Profile: 87 y.o. male  with past medical history of HTN, sleep apnea, COPD, CVA, CKD IIIb admitted on 06/21/2021 with diverticulitis, AKI on CKD, SB perforation. He is s/p small bowel resection and anastamosis. Recovery has been complicated by mental status changes and metabolic acidosis (improving today). Palliative medicine consulted for [place].     Primary Decision Maker PATIENT  Discussion: I have reviewed medical records including EPIC notes, labs and imaging, received report from RN, assessed the patient and then met at the bedside along with the patient, his spouse and daughter, to discuss diagnosis prognosis, GOC, EOL wishes, disposition and options.  I introduced Palliative Medicine as specialized medical care for people living with serious illness. It focuses on providing relief from the symptoms and stress of a serious illness. The goal is to improve quality of life for both the patient and the family. Additionally, we conduct [place] meetings to clarify patient's values and goals of what they wish their medical care to do for them and what medical interventions they would and would not wish to have.   We discussed a brief life review of the patient. He is an Customer service manager. His family and faith are the most important things to him.  Prior to this admission he was living in skilled [facility] at [facility] with his spouse. He was primarily independent with ADL's but was very fatigued all the time.   He and his spouse note that he finds very little joy in his day to day life. His only joy currently is when his wife and his daughter visit.   We discussed his current  state of health- he and his spouse feel that he is improving. We discussed that unfortunately, in the presence of all of his chronic illnesses, it is likely he may not return to his prior level of functioning due to this most recent insult to his body.   His current GOC are to continue to improve and to return to [facility]. When asked if he had any milestones or special achievements he would wish to accomplish before his EOL he noted that was a difficult question to answer.   He did state that if his health were to decline after discharge he would not wish to return to the hospital. He would want to be kept comfortable at [facility] and proceed with dying process in comfort.   Hospice and Palliative Care services outpatient were explained and offered. If he were to decline during hospitalization and fail to improve- recommend Hospice care. If he continues to improve until discharge- recommend Palliative. Patient and family are agreeable with those recommendations.   Discussed the importance of continued conversation with family and the medical providers regarding overall plan of care and treatment options, ensuring decisions  are within the context of the patient's values and GOCs.    SUMMARY OF RECOMMENDATIONS -Continue current level of care -Plan for d/c back to [facility] with either Palliative or Hospice based on patient's progress     Code Status/Advance Care Planning: DNR   Prognosis:   Unable to determine  Discharge Planning: To Be Determined  Primary Diagnoses: Present on Admission:  Acute diverticulitis   Review of Systems  Physical Exam  Vital Signs: BP (!) 146/62 (BP Location: Right Arm)   Pulse 76   Temp 97.6 F (36.4 C)   Resp 17   Ht 5' 8.5" (1.74 m)   Wt 104.4 kg   SpO2 95%   BMI 34.49 kg/m  Pain Scale: 0-10 POSS *See Group Information*: 2-Acceptable,Slightly drowsy, easily aroused Pain Score: Asleep   SpO2: SpO2: 95 % O2 Device:SpO2: 95 % O2 Flow  Rate: .O2 Flow Rate (L/min): 5 L/min  IO: Intake/output summary:  Intake/Output Summary (Last 24 hours) at 06/29/2021 1109 Last data filed at 06/29/2021 0940 Gross per 24 hour  Intake 2134.97 ml  Output 2200 ml  Net -65.03 ml    LBM: Last BM Date: 06/29/21 Baseline Weight: Weight: 94.3 kg Most recent weight: Weight: 104.4 kg     Palliative Assessment/Data:       Thank you for this consult. Palliative medicine will continue to follow and assist as needed.   Time In: 1000 Time Out: 1125 Time Total: 85 minutes Greater than 50%  of this time was spent counseling and coordinating care related to the above assessment and plan.  Signed by: Mariana Kaufman, AGNP-C Palliative Medicine    Please contact Palliative Medicine Team phone at [phone] for questions and concerns.  For individual provider: See Shea Evans

## 2021-06-30 ENCOUNTER — Inpatient Hospital Stay: Payer: Medicare PPO

## 2021-06-30 LAB — BASIC METABOLIC PANEL
Anion gap: 5 (ref 5–15)
BUN: 41 mg/dL — ABNORMAL HIGH (ref 8–23)
CO2: 28 mmol/L (ref 22–32)
Calcium: 8.1 mg/dL — ABNORMAL LOW (ref 8.9–10.3)
Chloride: 103 mmol/L (ref 98–111)
Creatinine, Ser: 2.22 mg/dL — ABNORMAL HIGH (ref 0.61–1.24)
GFR, Estimated: 27 mL/min — ABNORMAL LOW (ref >60)
Glucose, Bld: 118 mg/dL — ABNORMAL HIGH (ref 70–99)
Potassium: 3.8 mmol/L (ref 3.5–5.1)
Sodium: 136 mmol/L (ref 135–145)

## 2021-06-30 LAB — GLUCOSE, CAPILLARY: Glucose-Capillary: 123 mg/dL — ABNORMAL HIGH (ref 70–99)

## 2021-06-30 MED ORDER — SODIUM CHLORIDE 0.9 % IV SOLN
2.0000 g | INTRAVENOUS | Status: AC
  Administered 2021-06-30 – 2021-07-03 (×4): 2 g via INTRAVENOUS
  Filled 2021-06-30 (×2): qty 20
  Filled 2021-06-30 (×2): qty 2
  Filled 2021-06-30: qty 20

## 2021-06-30 NOTE — Progress Notes (Signed)
Speech Language Pathology Treatment: Dysphagia  Patient Details Name: Trevor Ferguson MRN: [id number] DOB: 1926/03/14 Today's Date: 06/30/2021 Time: 0900-0930 SLP Time Calculation (min) (ACUTE ONLY): 30 min  Assessment / Plan / Recommendation Clinical Impression  Pt seen for ongoing assessment of swallowing. He is alert, verbally responsive and engaged in conversation w/ SLP. Pt is on [place] O2 support. Reports doing well with food yesterday and this morning w/ no GI upset. MD agreed.  Pt explained general aspiration precautions and agreed verbally to the need for following them especially sitting upright for all oral intake; Small bites and sips chewing foods well. Pt assisted w/ positioning d/t min weakness. He continued to feed himself breakfast meal of purees then trials of soft solids w/ thin liquids via cup. No overt clinical s/s of aspiration were noted w/ any consistency; respiratory status remained calm and unlabored, vocal quality clear b/t trials. Oral phase appeared grossly [facility] for bolus management, mastication, and timely A-P transfer for swallowing; oral clearing achieved w/ all consistencies. NSG denied any deficits in swallowing as well. Pt expressed interest in more solid foods at his meals.   Pt appears at reduced risk for aspriation when following general aspiration precautions. Recommend a Regular/mech soft diet for ease of soft, cooked foods Cut w/ gravies added to moisten foods; Thin liquids. Recommend general aspiration precautions; Pills Whole in Puree; tray setup and positioning assistance for meals. ST services will sign off at this time w/ MD to reconsult if needed while admitted. NSG updated. Precautions posted at bedside.     HPI HPI: 87 y.o. male with diverticulitis of small intestine with perforation 5 Day Post-Op s/p small bowel resection and anastomosis, complicated by pertinent comorbidities including COPD, hypertension, stage IIIb chronic kidney disease, history of CVA,  sleep apnea. Advanced to clear liquids by surgery; per RN pt not swallowing medications. Per chart review, pt with trace aspiration on barium swallow in 2020; was scheduled for OP MBS but has not completed. Wife reports some coughing with PO intake at baseline; f/u w/ OP MBS is needs indicate post Discharge.      SLP Plan  All goals met      Recommendations for follow up therapy are one component of a multi-disciplinary discharge planning process, led by the attending physician.  Recommendations may be updated based on patient status, additional functional criteria and insurance authorization.    Recommendations  Diet recommendations: Thin liquid;Regular (soft diet w/ meats cut, moistened) Liquids provided via: Cup Medication Administration: Whole meds with puree (for safer swallowing) Supervision: Patient able to self feed;Intermittent supervision to cue for compensatory strategies Compensations: Minimize environmental distractions;Slow rate;Small sips/bites;Lingual sweep for clearance of pocketing;Follow solids with liquid Postural Changes and/or Swallow Maneuvers: Out of bed for meals;Seated upright 90 degrees;Upright 30-60 min after meal                General recommendations:  (Dietician f/u) Oral Care Recommendations: Oral care BID;Patient independent with oral care (setup) Follow Up Recommendations: No SLP follow up Assistance recommended at discharge: None SLP Visit Diagnosis: Dysphagia, unspecified (R13.10) Plan: All goals met       GO                  Orinda Kenner, MS, CCC-SLP Speech Language Pathologist Rehab Services [phone] [facility]  06/30/2021, 2:35 PM

## 2021-06-30 NOTE — Progress Notes (Signed)
PROGRESS NOTE  Trevor Ferguson    DOB: 20-Jul-1926, 87 y.o.  ZJI:[id number]  PCP: [name], MD   Code Status: DNR   DOA: 06/21/2021   LOS: 9  Brief Narrative of Current Hospitalization  Trevor Ferguson is a 87 y.o. male with a PMH significant for COPD, HTN, HLD, sleep apnea, CKD 3B, CVA. They presented from SNF to the ED on 06/21/2021 with abdominal pain x a few days. In the ED, it was found that they had small bowel diverticulitis with possible microperforation.  General surgery was consulted. 11/4 patient underwent exploratory laparotomy with small bowel resection 11/6 postoperative ileus 11/8 having bowel movements, 3 L O2 requirement, increased lethargy 11/10 O2 requirement 1-7P, metabolic acidosis improving with bicarb gtt, unable to tolerate PO  06/30/21 -stable  Assessment & Plan  Active Problems:   Acute diverticulitis   AKI (acute kidney injury) ([place])   Diverticulitis   Hypoxia   Perforation of intestine due to diverticulitis of gastrointestinal tract   HAP (hospital-acquired pneumonia)   Abdominal distension (gaseous)  Acute small bowel diverticulitis- s/p resection 10/2 complicated by postoperative ileus.  Last documented stool was 11/11 -Surgery following, appreciate recommendations -Advance diet as tolerated and per SLP -Zosyn per general surgery (11/8-11/10), flagyl (11/4-11/8) - gentle bowel regimen  Acute hypoxic respiratory failure 2/2 atelectasis versus early pneumonia  COPD - stable alertness today. Worsening to requirement of 6L. Repeat cxray today showed worsening consolidation of R lung opacity suspicious for PNA, patient currently on therapy. Radiologist read recommends f/u imaging in 3-4 weeks to exclude underlying malignancy. -continue respiratory coverage antibiotics.  Has documented penicillin allergy  - azithromycin (11/9-  - adding on CTX (11/12-11/16) -DuoNebs twice daily -Robinul twice daily - SLP reevaluation - wean to room air as  tolerated  Encephalopathy- oriented to self only. Limited PO intake. No significant improvements in mental status with resolution of acidosis.  - palliative consulted.   - meeting with family to discuss [place]- continue current course of treatment and dc with hospice/palliative to facility - continue PT/OT - attempt to get out of bed and limit interference to avoid delirium.   AKI on CKD IIIb  metabolic acidosis (resolved)  mild hypokalemia (resolved)- improving. Cr 3.7>2.9>2.43>2.22. serum bicarb 28, K+ 3.8 -Nephrology following, appreciate recommendations -Monitor serum bicarb daily, BMP am - replete electrolytes PRN  Primary HTN-well-controlled with baseline low diastolic readings -Continue on Norvasc -Holding home ACE inhibitor due to kidney function  Depression -Continue home Lexapro  GERD -Continue home PPI  Chronic iron deficiency anemia -Continue home iron  DVT prophylaxis: heparin injection 5,000 Units Start: 06/27/21 0600   Diet:  Diet Orders (From admission, onward)     Start     Ordered   06/30/21 1024  DIET SOFT Room service appropriate? Yes with Assist; Fluid consistency: Thin  Diet effective now       Comments: Please CUT the meats and add Gravy  Question Answer Comment  Room service appropriate? Yes with Assist   Fluid consistency: Thin      06/30/21 1024            Subjective 06/30/21    Pt receiving bath today. His alertness is unchanged. No complaints.   Disposition Plan & Communication  Patient status: Inpatient  Admitted From: SNF Disposition: Skilled nursing facility Anticipated discharge date: TBD  Family Communication: none Consults, Procedures, Significant Events  Consultants:  Nephrology General surgery Palliative   Procedures/significant events:  Small bowel resection 11/4  Antimicrobials:  Anti-infectives (From admission, onward)    Start     Dose/Rate Route Frequency Ordered Stop   06/30/21 1300  cefTRIAXone  (ROCEPHIN) 2 g in sodium chloride 0.9 % 100 mL IVPB        2 g 200 mL/hr over 30 Minutes Intravenous Every 24 hours 06/30/21 1205 07/04/21 1259   06/27/21 1400  azithromycin (ZITHROMAX) 250 mg in dextrose 5 % 125 mL IVPB        250 mg 125 mL/hr over 60 Minutes Intravenous Every 24 hours 06/27/21 1239 07/01/21 1759   06/26/21 1800  piperacillin-tazobactam (ZOSYN) IVPB 2.25 g  Status:  Discontinued        2.25 g 100 mL/hr over 30 Minutes Intravenous Every 6 hours 06/26/21 1604 06/28/21 0748   06/22/21 2100  ceFEPIme (MAXIPIME) 2 g in sodium chloride 0.9 % 100 mL IVPB  Status:  Discontinued        2 g 200 mL/hr over 30 Minutes Intravenous Every 24 hours 06/21/21 2331 06/26/21 1604   06/22/21 0600  metroNIDAZOLE (FLAGYL) IVPB 500 mg  Status:  Discontinued        500 mg 100 mL/hr over 60 Minutes Intravenous Every 8 hours 06/21/21 2331 06/26/21 1604   06/21/21 2100  ceFEPIme (MAXIPIME) 2 g in sodium chloride 0.9 % 100 mL IVPB        2 g 200 mL/hr over 30 Minutes Intravenous  Once 06/21/21 2050 06/21/21 2130   06/21/21 2100  metroNIDAZOLE (FLAGYL) IVPB 500 mg        500 mg 100 mL/hr over 60 Minutes Intravenous  Once 06/21/21 2050 06/22/21 1616       Objective   Vitals:   06/30/21 0004 06/30/21 0357 06/30/21 0754 06/30/21 1138  BP: 136/64 139/76 (!) 135/51 (!) 150/67  Pulse: 88 80 74 80  Resp: 18 18 18 19   Temp: 98.1 F (36.7 C) 97.7 F (36.5 C) 97.7 F (36.5 C) 98.2 F (36.8 C)  TempSrc:  Oral    SpO2: 94% 98% 96% 98%  Weight:      Height:        Intake/Output Summary (Last 24 hours) at 06/30/2021 1208 Last data filed at 06/30/2021 1139 Gross per 24 hour  Intake 1672.25 ml  Output 300 ml  Net 1372.25 ml   Filed Weights   06/22/21 2134 06/26/21 1549 06/27/21 0458  Weight: 96.2 kg 103.6 kg 104.4 kg    Patient BMI: Body mass index is 34.49 kg/m.   Physical Exam: General: Awake, NAD HEENT: moist mucous membranes Respiratory: normal respiratory effort. Mild rales.  Decreased breath sounds at bases. Cardiovascular: normal S1/S2, RRR, no JVD, murmurs, rubs, gallops, quick capillary refill  Gastrointestinal: soft, NT, firm, no HSM felt Nervous: Alert and oriented to self consistently. Able to follow simple commands. Extremities: Trace lower extremity edema Skin: dry, intact, normal temperature, normal color, No rashes, lesions or ulcers on exposed skin  Labs   I have personally reviewed following labs and imaging studies BMP Latest Ref Rng & Units 06/30/2021 06/29/2021 06/28/2021  Glucose 70 - 99 mg/dL 118(H) 136(H) 135(H)  BUN 8 - 23 mg/dL 41(H) 43(H) 50(H)  Creatinine 0.61 - 1.24 mg/dL 2.22(H) 2.43(H) 2.98(H)  Sodium 135 - 145 mmol/L 136 136 133(L)  Potassium 3.5 - 5.1 mmol/L 3.8 3.2(L) 3.7  Chloride 98 - 111 mmol/L 103 105 105  CO2 22 - 32 mmol/L 28 26 21(L)  Calcium 8.9 - 10.3 mg/dL 8.1(L) 8.1(L) 8.0(L)   CBC  Component Value Date/Time   WBC 17.6 (H) 06/28/2021 0323   RBC 3.14 (L) 06/28/2021 0323   HGB 9.5 (L) 06/28/2021 0323   HGB 12.8 (L) 03/19/2014 1049   HCT 28.5 (L) 06/28/2021 0323   HCT 37.7 (L) 03/19/2014 1049   PLT 243 06/28/2021 0323   PLT 137 (L) 03/19/2014 1049   MCV 90.8 06/28/2021 0323   MCV 97 03/19/2014 1049   MCH 30.3 06/28/2021 0323   MCHC 33.3 06/28/2021 0323   RDW 14.8 06/28/2021 0323   RDW 13.2 03/19/2014 1049   LYMPHSABS 0.5 (L) 06/27/2021 0334   MONOABS 1.6 (H) 06/27/2021 0334   EOSABS 0.1 06/27/2021 0334   BASOSABS 0.1 06/27/2021 0334    Imaging Studies  DG Chest Port 1 View  Result Date: 06/30/2021 CLINICAL DATA:  Dyspnea R06.00 (ICD-10-CM) EXAM: PORTABLE CHEST 1 VIEW COMPARISON:  June 26, 2021. FINDINGS: Slightly increased conspicuity of patchy airspace opacity in the right midlung. No visible pleural effusions or pneumothorax. Similar enlarged cardiac silhouette. Degenerative changes of the spine. IMPRESSION: 1. Slightly increased conspicuity of patchy airspace opacity in the right midlung,  suspicious for pneumonia. Followup PA and lateral chest X-ray is recommended in 3-4 weeks following trial of antibiotic therapy to ensure resolution and exclude underlying malignancy. 2. Similar cardiomegaly. Electronically Signed   By: Margaretha Sheffield M.D.   On: 06/30/2021 11:43   Medications   Scheduled Meds:  acidophilus  2 capsule Oral TID   amLODipine  5 mg Oral Daily   benzonatate  200 mg Oral TID   budesonide (PULMICORT) nebulizer solution  0.5 mg Nebulization Daily   Chlorhexidine Gluconate Cloth  6 each Topical Daily   cholecalciferol  1,000 Units Oral Daily   escitalopram  10 mg Oral Daily   famotidine  10 mg Oral BID   feeding supplement  237 mL Oral TID BM   ferrous sulfate  325 mg Oral Q breakfast   heparin injection (subcutaneous)  5,000 Units Subcutaneous Q8H   mouth rinse  15 mL Mouth Rinse BID   multivitamin with minerals  1 tablet Oral Daily   traZODone  150 mg Oral QHS   No recently discontinued medications to reconcile  LOS: 9 days   Time spent: >77min  Eames Dibiasio L Terrelle Ruffolo, DO [facility] 06/30/2021, 12:08 PM   Please refer to amion to contact the [facility] Attending or Consulting provider for this pt  [email or web address] Available by Epic secure chat 7AM-7PM. If 7PM-7AM, please contact night-coverage

## 2021-06-30 NOTE — Progress Notes (Signed)
[facility]  PROGRESS NOTE   Subjective:   Patient seen at bedside.  Comfortable today.  Objective:  Vital signs in last 24 hours:  Temp:  [97.7 F (36.5 C)-98.7 F (37.1 C)] 97.8 F (36.6 C) (11/12 1553) Pulse Rate:  [69-88] 69 (11/12 1553) Resp:  [18-19] 18 (11/12 1553) BP: (135-156)/(51-76) 156/66 (11/12 1553) SpO2:  [94 %-98 %] 97 % (11/12 1553)  Weight change:  Filed Weights   06/22/21 2134 06/26/21 1549 06/27/21 0458  Weight: 96.2 kg 103.6 kg 104.4 kg    Intake/Output: I/O last 3 completed shifts: In: 2212.3 [P.O.:1100; I.V.:826.8; IV Piggyback:285.4] Out: 800 [Urine:800]   Intake/Output this shift:  Total I/O In: 800 [P.O.:800] Out: -   Physical Exam: General:  No acute distress  Head:  Normocephalic, atraumatic. Moist oral mucosal membranes  Eyes:  Anicteric  Neck:  Supple  Lungs:   Clear to auscultation, normal effort  Heart:  S1S2 no rubs  Abdomen:   Soft, nontender, bowel sounds present  Extremities:  peripheral edema.  Neurologic:  Awake, alert, following commands  Skin:  No lesions  Access:     Basic Metabolic Panel: Recent Labs  Lab 06/26/21 1635 06/27/21 0334 06/28/21 0323 06/29/21 0426 06/30/21 0445  NA 130* 132* 133* 136 136  K 4.8 4.2 3.7 3.2* 3.8  CL 104 105 105 105 103  CO2 17* 19* 21* 26 28  GLUCOSE 109* 140* 135* 136* 118*  BUN 62* 61* 50* 43* 41*  CREATININE 3.75* 3.71* 2.98* 2.43* 2.22*  CALCIUM 7.9* 7.9* 8.0* 8.1* 8.1*    CBC: Recent Labs  Lab 06/25/21 0510 06/26/21 0429 06/26/21 1635 06/27/21 0334 06/28/21 0323  WBC 11.4* 12.2* 14.6* 12.1* 17.6*  NEUTROABS 8.5* 9.2* 11.2* 9.5*  --   HGB 8.7* 8.9* 9.3* 8.8* 9.5*  HCT 26.4* 27.9* 28.9* 26.7* 28.5*  MCV 96.4 96.2 96.0 92.7 90.8  PLT 211 206 231 213 243     Urinalysis: No results for input(s): COLORURINE, LABSPEC, PHURINE, GLUCOSEU, HGBUR, BILIRUBINUR, KETONESUR, PROTEINUR, UROBILINOGEN, NITRITE, LEUKOCYTESUR in the last 72 hours.  Invalid  input(s): APPERANCEUR    Imaging: DG Chest Port 1 View  Result Date: 06/30/2021 CLINICAL DATA:  Dyspnea R06.00 (ICD-10-CM) EXAM: PORTABLE CHEST 1 VIEW COMPARISON:  June 26, 2021. FINDINGS: Slightly increased conspicuity of patchy airspace opacity in the right midlung. No visible pleural effusions or pneumothorax. Similar enlarged cardiac silhouette. Degenerative changes of the spine. IMPRESSION: 1. Slightly increased conspicuity of patchy airspace opacity in the right midlung, suspicious for pneumonia. Followup PA and lateral chest X-ray is recommended in 3-4 weeks following trial of antibiotic therapy to ensure resolution and exclude underlying malignancy. 2. Similar cardiomegaly. Electronically Signed   By: Margaretha Sheffield M.D.   On: 06/30/2021 11:43     Medications:    sodium chloride 10 mL/hr at 06/26/21 1905   azithromycin Stopped (06/29/21 2230)   cefTRIAXone (ROCEPHIN)  IV 2 g (06/30/21 1356)    acidophilus  2 capsule Oral TID   amLODipine  5 mg Oral Daily   benzonatate  200 mg Oral TID   budesonide (PULMICORT) nebulizer solution  0.5 mg Nebulization Daily   Chlorhexidine Gluconate Cloth  6 each Topical Daily   cholecalciferol  1,000 Units Oral Daily   escitalopram  10 mg Oral Daily   famotidine  10 mg Oral BID   feeding supplement  237 mL Oral TID BM   ferrous sulfate  325 mg Oral Q breakfast   heparin injection (subcutaneous)  5,000 Units Subcutaneous Q8H   mouth rinse  15 mL Mouth Rinse BID   multivitamin with minerals  1 tablet Oral Daily   traZODone  150 mg Oral QHS    Assessment/ Plan:     Active Problems:   Acute diverticulitis   AKI (acute kidney injury) ([place])   Diverticulitis   Hypoxia   Perforation of intestine due to diverticulitis of gastrointestinal tract   HAP (hospital-acquired pneumonia)   Abdominal distension (gaseous)  87 year old male with history of hypertension, coronary artery disease, hyperlipidemia, COPD/obstructive sleep apnea, chronic  kidney disease stage IIIb now found to have diverticulitis.  He underwent exploratory laparotomy.  Patient now has acute kidney injury on the top of chronic kidney disease.  #1: Acute kidney injury: Patient is acute kidney injury on top of chronic kidney disease possibly secondary to prerenal azotemia.  Renal indicis are now stable.  Urine output has been improving.  #2: Anemia: Patient has anemia of chronic kidney disease.  #3: Hypertension: Continue present antihypertensive medications.  #4: Respiratory failure/COPD exacerbation/pneumonia.  Continue antibiotics as ordered.  #5: Diverticulitis: S/p surgery.  Continue to monitor closely.   LOS: [place], [facility] 11/12/20224:30 PM

## 2021-06-30 NOTE — Progress Notes (Signed)
Patient ID: Trevor Ferguson, male   DOB: 03/25/1926, 87 y.o.   MRN: [id number]     [place] [facility] Day(s): 9.   Interval History: Patient seen and examined, no acute events or new complaints overnight. Patient reports feeling the same. Patient was sleepy but arousable. Denies any vomit. Reports doing well with food yesterday and this morning.  Vital signs in last 24 hours: [min-max] current  Temp:  [97.7 F (36.5 C)-98.7 F (37.1 C)] 97.7 F (36.5 C) (11/12 0754) Pulse Rate:  [63-88] 74 (11/12 0754) Resp:  [18-20] 18 (11/12 0754) BP: (133-154)/(51-76) 135/51 (11/12 0754) SpO2:  [94 %-98 %] 96 % (11/12 0754)     Height: 5' 8.5" (174 cm) Weight: 104.4 kg BMI (Calculated): 34.48   Physical Exam:  Constitutional: alert, cooperative and no distress  Respiratory: breathing non-labored at rest  Cardiovascular: regular rate and sinus rhythm  Gastrointestinal: soft, non-tender, and non-distended. Wound is dry and clean.   Labs:  CBC Latest Ref Rng & Units 06/28/2021 06/27/2021 06/26/2021  WBC 4.0 - 10.5 K/uL 17.6(H) 12.1(H) 14.6(H)  Hemoglobin 13.0 - 17.0 g/dL 9.5(L) 8.8(L) 9.3(L)  Hematocrit 39.0 - 52.0 % 28.5(L) 26.7(L) 28.9(L)  Platelets 150 - 400 K/uL 243 213 231   CMP Latest Ref Rng & Units 06/30/2021 06/29/2021 06/28/2021  Glucose 70 - 99 mg/dL 118(H) 136(H) 135(H)  BUN 8 - 23 mg/dL 41(H) 43(H) 50(H)  Creatinine 0.61 - 1.24 mg/dL 2.22(H) 2.43(H) 2.98(H)  Sodium 135 - 145 mmol/L 136 136 133(L)  Potassium 3.5 - 5.1 mmol/L 3.8 3.2(L) 3.7  Chloride 98 - 111 mmol/L 103 105 105  CO2 22 - 32 mmol/L 28 26 21(L)  Calcium 8.9 - 10.3 mg/dL 8.1(L) 8.1(L) 8.0(L)  Total Protein 6.5 - 8.1 g/dL - - -  Total Bilirubin 0.3 - 1.2 mg/dL - - -  Alkaline Phos 38 - 126 U/L - - -  AST 15 - 41 U/L - - -  ALT 0 - 44 U/L - - -    Imaging studies: No new pertinent imaging studies   Assessment/Plan:  87 y.o. male with diverticulitis of small intestine with perforation 8 Day Post-Op  s/p small bowel resection and anastomosis, complicated by pertinent comorbidities including COPD, hypertension, stage IIIb chronic kidney disease, history of CVA, sleep apnea.  Patient tolerated full liquid diet yesterday. No vomit or nausea with full liquid. I discuss case with nurse and speech and swallow therapist and he did well this morning. Patient is ready for solid (soft diet) food from my standpoint. He should stay on soft diet for at least 4 weeks.   Appreciate Medicine and Consultants for medical management of comordidities. I will continue to follow closely.   Arnold Long, MD

## 2021-07-01 DIAGNOSIS — L8990 Pressure ulcer of unspecified site, unspecified stage: Secondary | ICD-10-CM | POA: Insufficient documentation

## 2021-07-01 LAB — CULTURE, BLOOD (ROUTINE X 2)
Culture: NO GROWTH
Culture: NO GROWTH
Special Requests: ADEQUATE

## 2021-07-01 LAB — BASIC METABOLIC PANEL
Anion gap: 6 (ref 5–15)
BUN: 42 mg/dL — ABNORMAL HIGH (ref 8–23)
CO2: 29 mmol/L (ref 22–32)
Calcium: 8.3 mg/dL — ABNORMAL LOW (ref 8.9–10.3)
Chloride: 102 mmol/L (ref 98–111)
Creatinine, Ser: 1.98 mg/dL — ABNORMAL HIGH (ref 0.61–1.24)
GFR, Estimated: 31 mL/min — ABNORMAL LOW (ref >60)
Glucose, Bld: 119 mg/dL — ABNORMAL HIGH (ref 70–99)
Potassium: 3.9 mmol/L (ref 3.5–5.1)
Sodium: 137 mmol/L (ref 135–145)

## 2021-07-01 LAB — GLUCOSE, CAPILLARY: Glucose-Capillary: 124 mg/dL — ABNORMAL HIGH (ref 70–99)

## 2021-07-01 NOTE — Progress Notes (Signed)
[facility]  PROGRESS NOTE   Subjective:   Out of bed to chair.  Family at bedside. Surgery note appreciated.  Objective:  Vital signs in last 24 hours:  Temp:  [97.8 F (36.6 C)-98.7 F (37.1 C)] 98.3 F (36.8 C) (11/13 1100) Pulse Rate:  [54-85] 54 (11/13 1100) Resp:  [16-21] 20 (11/13 1100) BP: (118-156)/(58-71) 142/70 (11/13 1100) SpO2:  [77 %-98 %] 95 % (11/13 1100)  Weight change:  Filed Weights   06/22/21 2134 06/26/21 1549 06/27/21 0458  Weight: 96.2 kg 103.6 kg 104.4 kg    Intake/Output: I/O last 3 completed shifts: In: 3271.8 [P.O.:1640; I.V.:326.8; IV Piggyback:1305] Out: 650 [Urine:650]   Intake/Output this shift:  Total I/O In: 240 [P.O.:240] Out: -   Physical Exam: General:  No acute distress  Head:  Normocephalic, atraumatic. Moist oral mucosal membranes  Eyes:  Anicteric  Neck:  Supple  Lungs:   Clear to auscultation, normal effort  Heart:  S1S2 no rubs  Abdomen:   Soft, nontender, bowel sounds present  Extremities:  peripheral edema.  Neurologic:  Awake, alert, following commands  Skin:  No lesions  Access:     Basic Metabolic Panel: Recent Labs  Lab 06/27/21 0334 06/28/21 0323 06/29/21 0426 06/30/21 0445 07/01/21 0554  NA 132* 133* 136 136 137  K 4.2 3.7 3.2* 3.8 3.9  CL 105 105 105 103 102  CO2 19* 21* 26 28 29   GLUCOSE 140* 135* 136* 118* 119*  BUN 61* 50* 43* 41* 42*  CREATININE 3.71* 2.98* 2.43* 2.22* 1.98*  CALCIUM 7.9* 8.0* 8.1* 8.1* 8.3*    CBC: Recent Labs  Lab 06/25/21 0510 06/26/21 0429 06/26/21 1635 06/27/21 0334 06/28/21 0323  WBC 11.4* 12.2* 14.6* 12.1* 17.6*  NEUTROABS 8.5* 9.2* 11.2* 9.5*  --   HGB 8.7* 8.9* 9.3* 8.8* 9.5*  HCT 26.4* 27.9* 28.9* 26.7* 28.5*  MCV 96.4 96.2 96.0 92.7 90.8  PLT 211 206 231 213 243     Urinalysis: No results for input(s): COLORURINE, LABSPEC, PHURINE, GLUCOSEU, HGBUR, BILIRUBINUR, KETONESUR, PROTEINUR, UROBILINOGEN, NITRITE, LEUKOCYTESUR in the last 72  hours.  Invalid input(s): APPERANCEUR    Imaging: DG Chest Port 1 View  Result Date: 06/30/2021 CLINICAL DATA:  Dyspnea R06.00 (ICD-10-CM) EXAM: PORTABLE CHEST 1 VIEW COMPARISON:  June 26, 2021. FINDINGS: Slightly increased conspicuity of patchy airspace opacity in the right midlung. No visible pleural effusions or pneumothorax. Similar enlarged cardiac silhouette. Degenerative changes of the spine. IMPRESSION: 1. Slightly increased conspicuity of patchy airspace opacity in the right midlung, suspicious for pneumonia. Followup PA and lateral chest X-ray is recommended in 3-4 weeks following trial of antibiotic therapy to ensure resolution and exclude underlying malignancy. 2. Similar cardiomegaly. Electronically Signed   By: Margaretha Sheffield M.D.   On: 06/30/2021 11:43     Medications:    sodium chloride 10 mL/hr at 06/26/21 1905   azithromycin 250 mg (06/30/21 1815)   cefTRIAXone (ROCEPHIN)  IV 2 g (07/01/21 1303)    acidophilus  2 capsule Oral TID   amLODipine  5 mg Oral Daily   benzonatate  200 mg Oral TID   budesonide (PULMICORT) nebulizer solution  0.5 mg Nebulization Daily   Chlorhexidine Gluconate Cloth  6 each Topical Daily   cholecalciferol  1,000 Units Oral Daily   escitalopram  10 mg Oral Daily   famotidine  10 mg Oral BID   feeding supplement  237 mL Oral TID BM   ferrous sulfate  325 mg Oral Q breakfast  heparin injection (subcutaneous)  5,000 Units Subcutaneous Q8H   mouth rinse  15 mL Mouth Rinse BID   multivitamin with minerals  1 tablet Oral Daily   traZODone  150 mg Oral QHS    Assessment/ Plan:     Active Problems:   Acute diverticulitis   AKI (acute kidney injury) ([place])   Diverticulitis   Hypoxia   Perforation of intestine due to diverticulitis of gastrointestinal tract   HAP (hospital-acquired pneumonia)   Abdominal distension (gaseous)   Pressure injury of skin  87 year old male with history of hypertension, coronary artery disease,  hyperlipidemia, COPD/obstructive sleep apnea, chronic kidney disease stage IIIb now found to have diverticulitis.  He underwent exploratory laparotomy.  Patient now has acute kidney injury on the top of chronic kidney disease.   #1: Acute kidney injury: Patient is acute kidney injury on top of chronic kidney disease possibly secondary to prerenal azotemia.  Renal indicis are now stable.  Urine output has been improving.   #2: Anemia: Patient has anemia of chronic kidney disease.   #3: Hypertension: Continue present antihypertensive medications.   #4: Respiratory failure/COPD exacerbation/pneumonia.  Continue antibiotics as ordered.   #5: Diverticulitis: S/p surgery.  Surgery note appreciated.   Continue to monitor closely.  Spoke to the whole family at bedside.   LOS: [place], [facility] 11/13/20222:17 PM

## 2021-07-01 NOTE — Progress Notes (Signed)
Patient ID: Trevor Ferguson, male   DOB: 1925/12/13, 87 y.o.   MRN: [id number]     [place] [facility] Day(s): 10.   Interval History: Patient seen and examined, no acute events or new complaints overnight. Patient reports feeling well today. Patient seem to be doing much better. He was sitting down in chair, alert, oriented, cooperative. No new complains.  Vital signs in last 24 hours: [min-max] current  Temp:  [97.8 F (36.6 C)-98.7 F (37.1 C)] 98.3 F (36.8 C) (11/13 1100) Pulse Rate:  [54-85] 54 (11/13 1100) Resp:  [16-21] 20 (11/13 1100) BP: (118-156)/(58-71) 142/70 (11/13 1100) SpO2:  [77 %-98 %] 95 % (11/13 1100)     Height: 5' 8.5" (174 cm) Weight: 104.4 kg BMI (Calculated): 34.48   Physical Exam:  Constitutional: alert, cooperative and no distress  Respiratory: breathing non-labored at rest  Cardiovascular: regular rate and sinus rhythm  Gastrointestinal: soft, non-tender, and non-distended  Labs:  CBC Latest Ref Rng & Units 06/28/2021 06/27/2021 06/26/2021  WBC 4.0 - 10.5 K/uL 17.6(H) 12.1(H) 14.6(H)  Hemoglobin 13.0 - 17.0 g/dL 9.5(L) 8.8(L) 9.3(L)  Hematocrit 39.0 - 52.0 % 28.5(L) 26.7(L) 28.9(L)  Platelets 150 - 400 K/uL 243 213 231   CMP Latest Ref Rng & Units 07/01/2021 06/30/2021 06/29/2021  Glucose 70 - 99 mg/dL 119(H) 118(H) 136(H)  BUN 8 - 23 mg/dL 42(H) 41(H) 43(H)  Creatinine 0.61 - 1.24 mg/dL 1.98(H) 2.22(H) 2.43(H)  Sodium 135 - 145 mmol/L 137 136 136  Potassium 3.5 - 5.1 mmol/L 3.9 3.8 3.2(L)  Chloride 98 - 111 mmol/L 102 103 105  CO2 22 - 32 mmol/L 29 28 26   Calcium 8.9 - 10.3 mg/dL 8.3(L) 8.1(L) 8.1(L)  Total Protein 6.5 - 8.1 g/dL - - -  Total Bilirubin 0.3 - 1.2 mg/dL - - -  Alkaline Phos 38 - 126 U/L - - -  AST 15 - 41 U/L - - -  ALT 0 - 44 U/L - - -    Imaging studies: No new pertinent imaging studies   Assessment/Plan:  87 y.o. male with diverticulitis of small intestine with perforation 9 Day Post-Op s/p small bowel  resection and anastomosis, complicated by pertinent comorbidities including COPD, hypertension, stage IIIb chronic kidney disease, history of CVA, sleep apnea.  Patient today doing much better. He is awake, alert, cooperative. Eating the soft diet without problem. He also looks much better from the respiratory standpoint. Renal function improving.   From surgical standpoint, will continue with soft diet. No contraindication for physical therapy.   Continue medical management as per primary team and consultants.   I will continue to follow while inpatient.   Arnold Long, MD

## 2021-07-01 NOTE — Progress Notes (Signed)
PROGRESS NOTE  Trevor Ferguson    DOB: December 13, 1925, 87 y.o.  KGY:[id number]  PCP: [name], MD   Code Status: DNR   DOA: 06/21/2021   LOS: 10  Brief Narrative of Current Hospitalization  Trevor Ferguson is a 87 y.o. male with a PMH significant for COPD, HTN, HLD, sleep apnea, CKD 3B, CVA. They presented from SNF to the ED on 06/21/2021 with abdominal pain x a few days. In the ED, it was found that they had small bowel diverticulitis with possible microperforation.  General surgery was consulted. 11/4 patient underwent exploratory laparotomy with small bowel resection 11/6 postoperative ileus 11/8 having bowel movements, 3 L O2 requirement, increased lethargy 11/10 O2 requirement 0-2O, metabolic acidosis improving with bicarb gtt, unable to tolerate PO  07/01/21 -stable, improved  Assessment & Plan  Active Problems:   Acute diverticulitis   AKI (acute kidney injury) ([place])   Diverticulitis   Hypoxia   Perforation of intestine due to diverticulitis of gastrointestinal tract   HAP (hospital-acquired pneumonia)   Abdominal distension (gaseous)   Pressure injury of skin  Acute small bowel diverticulitis- s/p resection 37/8 complicated by postoperative ileus.  Last documented stool was 11/12. Tolerating soft diet without abdominal pain or nausea. -Surgery following, appreciate recommendations -tolerating diet well. Continue soft diet until OP surgery follow up -Zosyn per general surgery (11/8-11/10), flagyl (11/4-11/8) - gentle bowel regimen  Acute hypoxic respiratory failure 2/2 atelectasis versus early pneumonia  COPD - stable alertness today. Improved requirement to 5L. Radiologist read recommends f/u chest imaging in 3-4 weeks to exclude underlying malignancy. Family was made aware. Patient endorses using breathing treatment and on 2L O2 at baseline. -continue respiratory coverage antibiotics.  Has documented penicillin allergy  - azithromycin (11/9-  - adding on CTX  (11/12-11/16) -DuoNebs twice daily -Robinul twice daily - wean to 2L as tolerated  Encephalopathy- improved. Conversant and oriented today. - palliative consulted.   - meeting with family to discuss [place]- continue current course of treatment and dc with hospice/palliative to facility - continue PT/OT - attempt to get out of bed and limit interference to avoid delirium.   AKI on CKD IIIb  metabolic acidosis (resolved)  mild hypokalemia (resolved)- improving. Cr 3.7>>>1.98. serum bicarb 29, K+ 3.9 -Nephrology following, appreciate recommendations -Monitor serum bicarb daily, BMP am - replete electrolytes PRN  Primary HTN-well-controlled with baseline low diastolic readings -Continue on Norvasc -Holding home ACE inhibitor due to kidney function  Depression -Continue home Lexapro  GERD -Continue home PPI  Chronic iron deficiency anemia -Continue home iron  DVT prophylaxis: heparin injection 5,000 Units Start: 06/27/21 0600   Diet:  Diet Orders (From admission, onward)     Start     Ordered   06/30/21 1443  DIET SOFT Room service appropriate? Yes with Assist; Fluid consistency: Thin  Diet effective now       Comments: Please CUT the meats and add Gravy;  NO STRAWS!!! CREAM SOUPS ONLY  Question Answer Comment  Room service appropriate? Yes with Assist   Fluid consistency: Thin      06/30/21 1443            Subjective 07/01/21    Pt sitting in chair today and conversant. Expresses understanding of his condition and states he's doing well. Tolerated diet today without pain or nausea. Denies respiratory complaints.   Disposition Plan & Communication  Patient status: Inpatient  Admitted From: SNF Disposition: Skilled nursing facility Anticipated discharge date: 11/15 or 11/15  Family Communication: none Consults, Procedures, Significant Events  Consultants:  Nephrology General surgery Palliative   Procedures/significant events:  Small bowel resection  11/4  Antimicrobials:  Anti-infectives (From admission, onward)    Start     Dose/Rate Route Frequency Ordered Stop   06/30/21 1300  cefTRIAXone (ROCEPHIN) 2 g in sodium chloride 0.9 % 100 mL IVPB        2 g 200 mL/hr over 30 Minutes Intravenous Every 24 hours 06/30/21 1205 07/04/21 1259   06/27/21 1400  azithromycin (ZITHROMAX) 250 mg in dextrose 5 % 125 mL IVPB        250 mg 125 mL/hr over 60 Minutes Intravenous Every 24 hours 06/27/21 1239 07/01/21 1759   06/26/21 1800  piperacillin-tazobactam (ZOSYN) IVPB 2.25 g  Status:  Discontinued        2.25 g 100 mL/hr over 30 Minutes Intravenous Every 6 hours 06/26/21 1604 06/28/21 0748   06/22/21 2100  ceFEPIme (MAXIPIME) 2 g in sodium chloride 0.9 % 100 mL IVPB  Status:  Discontinued        2 g 200 mL/hr over 30 Minutes Intravenous Every 24 hours 06/21/21 2331 06/26/21 1604   06/22/21 0600  metroNIDAZOLE (FLAGYL) IVPB 500 mg  Status:  Discontinued        500 mg 100 mL/hr over 60 Minutes Intravenous Every 8 hours 06/21/21 2331 06/26/21 1604   06/21/21 2100  ceFEPIme (MAXIPIME) 2 g in sodium chloride 0.9 % 100 mL IVPB        2 g 200 mL/hr over 30 Minutes Intravenous  Once 06/21/21 2050 06/21/21 2130   06/21/21 2100  metroNIDAZOLE (FLAGYL) IVPB 500 mg        500 mg 100 mL/hr over 60 Minutes Intravenous  Once 06/21/21 2050 06/22/21 1616       Objective   Vitals:   06/30/21 2035 07/01/21 0001 07/01/21 0009 07/01/21 0432  BP: 123/62 (!) 118/58  (!) 150/66  Pulse: 84 85  69  Resp: 16 20  (!) 21  Temp: 98 F (36.7 C) 98 F (36.7 C)  98 F (36.7 C)  TempSrc: Oral     SpO2: 93% 95% (!) 77% 98%  Weight:      Height:        Intake/Output Summary (Last 24 hours) at 07/01/2021 0727 Last data filed at 07/01/2021 0300 Gross per 24 hour  Intake 2519.53 ml  Output 650 ml  Net 1869.53 ml    Filed Weights   06/22/21 2134 06/26/21 1549 06/27/21 0458  Weight: 96.2 kg 103.6 kg 104.4 kg    Patient BMI: Body mass index is 34.49 kg/m.    Physical Exam: General: Awake, NAD HEENT: moist mucous membranes Respiratory: normal respiratory effort. Mild rales. Clear lung sounds throughout Cardiovascular: normal S1/S2, RRR, no JVD, murmurs, rubs, gallops, quick capillary refill  Gastrointestinal: soft, NT, firm, no HSM felt Nervous: Alert and oriented. Able to follow simple commands. Extremities: Trace lower extremity edema Skin: dry, intact, normal temperature, normal color, No rashes, lesions or ulcers on exposed skin  Labs   I have personally reviewed following labs and imaging studies BMP Latest Ref Rng & Units 07/01/2021 06/30/2021 06/29/2021  Glucose 70 - 99 mg/dL 119(H) 118(H) 136(H)  BUN 8 - 23 mg/dL 42(H) 41(H) 43(H)  Creatinine 0.61 - 1.24 mg/dL 1.98(H) 2.22(H) 2.43(H)  Sodium 135 - 145 mmol/L 137 136 136  Potassium 3.5 - 5.1 mmol/L 3.9 3.8 3.2(L)  Chloride 98 - 111 mmol/L 102 103 105  CO2  22 - 32 mmol/L 29 28 26   Calcium 8.9 - 10.3 mg/dL 8.3(L) 8.1(L) 8.1(L)   CBC    Component Value Date/Time   WBC 17.6 (H) 06/28/2021 0323   RBC 3.14 (L) 06/28/2021 0323   HGB 9.5 (L) 06/28/2021 0323   HGB 12.8 (L) 03/19/2014 1049   HCT 28.5 (L) 06/28/2021 0323   HCT 37.7 (L) 03/19/2014 1049   PLT 243 06/28/2021 0323   PLT 137 (L) 03/19/2014 1049   MCV 90.8 06/28/2021 0323   MCV 97 03/19/2014 1049   MCH 30.3 06/28/2021 0323   MCHC 33.3 06/28/2021 0323   RDW 14.8 06/28/2021 0323   RDW 13.2 03/19/2014 1049   LYMPHSABS 0.5 (L) 06/27/2021 0334   MONOABS 1.6 (H) 06/27/2021 0334   EOSABS 0.1 06/27/2021 0334   BASOSABS 0.1 06/27/2021 0334    Imaging Studies  DG Chest Port 1 View  Result Date: 06/30/2021 CLINICAL DATA:  Dyspnea R06.00 (ICD-10-CM) EXAM: PORTABLE CHEST 1 VIEW COMPARISON:  June 26, 2021. FINDINGS: Slightly increased conspicuity of patchy airspace opacity in the right midlung. No visible pleural effusions or pneumothorax. Similar enlarged cardiac silhouette. Degenerative changes of the spine. IMPRESSION:  1. Slightly increased conspicuity of patchy airspace opacity in the right midlung, suspicious for pneumonia. Followup PA and lateral chest X-ray is recommended in 3-4 weeks following trial of antibiotic therapy to ensure resolution and exclude underlying malignancy. 2. Similar cardiomegaly. Electronically Signed   By: Margaretha Sheffield M.D.   On: 06/30/2021 11:43   Medications   Scheduled Meds:  acidophilus  2 capsule Oral TID   amLODipine  5 mg Oral Daily   benzonatate  200 mg Oral TID   budesonide (PULMICORT) nebulizer solution  0.5 mg Nebulization Daily   Chlorhexidine Gluconate Cloth  6 each Topical Daily   cholecalciferol  1,000 Units Oral Daily   escitalopram  10 mg Oral Daily   famotidine  10 mg Oral BID   feeding supplement  237 mL Oral TID BM   ferrous sulfate  325 mg Oral Q breakfast   heparin injection (subcutaneous)  5,000 Units Subcutaneous Q8H   mouth rinse  15 mL Mouth Rinse BID   multivitamin with minerals  1 tablet Oral Daily   traZODone  150 mg Oral QHS   No recently discontinued medications to reconcile  LOS: 10 days   Time spent: >46min  Kaede Clendenen L Rena Hunke, DO [facility] 07/01/2021, 7:27 AM   Please refer to amion to contact the [facility] Attending or Consulting provider for this pt  [email or web address] Available by Epic secure chat 7AM-7PM. If 7PM-7AM, please contact night-coverage

## 2021-07-02 DIAGNOSIS — L89000 Pressure ulcer of unspecified elbow, unstageable: Secondary | ICD-10-CM

## 2021-07-02 DIAGNOSIS — R0600 Dyspnea, unspecified: Secondary | ICD-10-CM

## 2021-07-02 DIAGNOSIS — R0609 Other forms of dyspnea: Secondary | ICD-10-CM

## 2021-07-02 DIAGNOSIS — R109 Unspecified abdominal pain: Secondary | ICD-10-CM

## 2021-07-02 MED ORDER — LOPERAMIDE HCL 2 MG PO CAPS
2.0000 mg | ORAL_CAPSULE | ORAL | Status: AC | PRN
  Administered 2021-07-02 – 2021-07-08 (×2): 2 mg via ORAL
  Filled 2021-07-02 (×2): qty 1

## 2021-07-02 NOTE — Progress Notes (Signed)
Pt arrived to room 208 via chair from 2A. Received report from [place], Therapist, sports. See assessment. Will continue to monitor.

## 2021-07-02 NOTE — Progress Notes (Signed)
Patient ID: Trevor Ferguson, male   DOB: 1925/12/25, 87 y.o.   MRN: [id number]     [place] [facility] Day(s): 11.   Interval History: Patient seen and examined, no acute events or new complaints overnight. Patient reports feeling well.  Was again seen sitting down in a chair comfortably.  He is very alert and oriented x3.  He is very cooperative.  He is in no distress.  He said that he had a good breakfast without any issues.  Vital signs in last 24 hours: [min-max] current  Temp:  [98 F (36.7 C)-98.7 F (37.1 C)] 98.7 F (37.1 C) (11/14 1210) Pulse Rate:  [54-91] 81 (11/14 1210) Resp:  [17-22] 17 (11/14 1210) BP: (140-158)/(58-79) 152/73 (11/14 1210) SpO2:  [89 %-97 %] 94 % (11/14 1210)     Height: 5' 8.5" (174 cm) Weight: 104.4 kg BMI (Calculated): 34.48   Physical Exam:  Constitutional: alert, cooperative and no distress  Respiratory: breathing non-labored at rest  Cardiovascular: regular rate and sinus rhythm  Gastrointestinal: soft, non-tender, and non-distended  Labs:  CBC Latest Ref Rng & Units 06/28/2021 06/27/2021 06/26/2021  WBC 4.0 - 10.5 K/uL 17.6(H) 12.1(H) 14.6(H)  Hemoglobin 13.0 - 17.0 g/dL 9.5(L) 8.8(L) 9.3(L)  Hematocrit 39.0 - 52.0 % 28.5(L) 26.7(L) 28.9(L)  Platelets 150 - 400 K/uL 243 213 231   CMP Latest Ref Rng & Units 07/01/2021 06/30/2021 06/29/2021  Glucose 70 - 99 mg/dL 119(H) 118(H) 136(H)  BUN 8 - 23 mg/dL 42(H) 41(H) 43(H)  Creatinine 0.61 - 1.24 mg/dL 1.98(H) 2.22(H) 2.43(H)  Sodium 135 - 145 mmol/L 137 136 136  Potassium 3.5 - 5.1 mmol/L 3.9 3.8 3.2(L)  Chloride 98 - 111 mmol/L 102 103 105  CO2 22 - 32 mmol/L 29 28 26   Calcium 8.9 - 10.3 mg/dL 8.3(L) 8.1(L) 8.1(L)  Total Protein 6.5 - 8.1 g/dL - - -  Total Bilirubin 0.3 - 1.2 mg/dL - - -  Alkaline Phos 38 - 126 U/L - - -  AST 15 - 41 U/L - - -  ALT 0 - 44 U/L - - -    Imaging studies: No new pertinent imaging studies   Assessment/Plan:  87 y.o. male with diverticulitis of  small intestine with perforation 10 Day Post-Op s/p small bowel resection and anastomosis, complicated by pertinent comorbidities including COPD, hypertension, stage IIIb chronic kidney disease, history of CVA, sleep apnea.  Patient recovering adequately from the surgical standpoint.  He continues tolerating soft diet.  There was no clinical deterioration in the last 24 hours.  Renal function continues to be improving and stable.  Consider removing Foley catheter.  From surgical standpoint he can be discharged when medically stable.  I will continue to follow while inpatient.  No contraindication for physical therapy.  Arnold Long, MD

## 2021-07-02 NOTE — Progress Notes (Signed)
PROGRESS NOTE  Trevor Ferguson    DOB: 11-25-1925, 87 y.o.  VOZ:[id number]  PCP: [name], MD   Code Status: DNR   DOA: 06/21/2021   LOS: 11  Brief Narrative of Current Hospitalization  Trevor Ferguson is a 87 y.o. male with a PMH significant for COPD, HTN, HLD, sleep apnea, CKD 3B, CVA. They presented from SNF to the ED on 06/21/2021 with abdominal pain x a few days. In the ED, it was found that they had small bowel diverticulitis with possible microperforation.  General surgery was consulted. 11/4 patient underwent exploratory laparotomy with small bowel resection 11/6 postoperative ileus 11/8 having bowel movements, 3 L O2 requirement, increased lethargy 11/10 O2 requirement 4-2V, metabolic acidosis improving with bicarb gtt, unable to tolerate PO 11/12 mental status improved. Metabolic acidosis resolved. Able to dc bicarb drip  07/02/21 -stable, improved  Assessment & Plan  Active Problems:   Acute diverticulitis   AKI (acute kidney injury) ([place])   Diverticulitis   Hypoxia   Perforation of intestine due to diverticulitis of gastrointestinal tract   HAP (hospital-acquired pneumonia)   Abdominal distension (gaseous)   Pressure injury of skin  Acute small bowel diverticulitis- s/p resection 95/6 complicated by postoperative ileus.  Last documented stool was 11/12. Tolerating soft diet without abdominal pain or nausea. -Surgery following, appreciate recommendations -tolerating diet well. Continue soft diet until OP surgery follow up -Zosyn per general surgery (11/8-11/10), flagyl (11/4-11/8) - gentle bowel regimen  Acute hypoxic respiratory failure 2/2 atelectasis versus early pneumonia  COPD - stable alertness today. Persistent oxygen requirement. Radiologist read recommends f/u chest imaging in 3-4 weeks to exclude underlying malignancy. Family was made aware. Patient endorses using breathing treatment and on 2L O2 at baseline. Will likely need new higher requirement on  discharge. -continue respiratory coverage antibiotics.  Has documented penicillin allergy  - azithromycin (11/9-11/14)  - CTX (11/12-11/16) -DuoNebs twice daily -Robinul twice daily - wean to 2L as tolerated  Encephalopathy- resolved. Conversant and oriented today. - palliative consulted.   - meeting with family to discuss [place]- continue current course of treatment and dc with hospice/palliative to facility - continue PT/OT - attempt to get out of bed and limit interference to avoid delirium.   AKI on CKD IIIb  metabolic acidosis (resolved)  mild hypokalemia (resolved)- improving. Cr 3.7>>>1.98. serum bicarb 29, K+ 3.9 -Nephrology following, appreciate recommendations -Monitor serum bicarb daily, BMP am - replete electrolytes PRN  Primary HTN-well-controlled with baseline low diastolic readings -Continue on Norvasc -Holding home ACE inhibitor due to kidney function  Depression -Continue home Lexapro  GERD -Continue home PPI  Chronic iron deficiency anemia -Continue home iron  DVT prophylaxis: heparin injection 5,000 Units Start: 06/27/21 0600   Diet:  Diet Orders (From admission, onward)     Start     Ordered   06/30/21 1443  DIET SOFT Room service appropriate? Yes with Assist; Fluid consistency: Thin  Diet effective now       Comments: Please CUT the meats and add Gravy;  NO STRAWS!!! CREAM SOUPS ONLY  Question Answer Comment  Room service appropriate? Yes with Assist   Fluid consistency: Thin      06/30/21 1443            Subjective 07/02/21    Pt sitting in chair today and conversant, eating. He has some diarrhea but no abdominal pain. Wants to go home as soon as possible.   Disposition Plan & Communication  Patient status: Inpatient  Admitted From: SNF Disposition: Skilled nursing facility Anticipated discharge date: 11/15 or 11/16  Family Communication: none Consults, Procedures, Significant Events  Consultants:  Nephrology General  surgery Palliative   Procedures/significant events:  Small bowel resection 11/4  Antimicrobials:  Anti-infectives (From admission, onward)    Start     Dose/Rate Route Frequency Ordered Stop   06/30/21 1300  cefTRIAXone (ROCEPHIN) 2 g in sodium chloride 0.9 % 100 mL IVPB        2 g 200 mL/hr over 30 Minutes Intravenous Every 24 hours 06/30/21 1205 07/04/21 1259   06/27/21 1400  azithromycin (ZITHROMAX) 250 mg in dextrose 5 % 125 mL IVPB        250 mg 125 mL/hr over 60 Minutes Intravenous Every 24 hours 06/27/21 1239 07/01/21 1759   06/26/21 1800  piperacillin-tazobactam (ZOSYN) IVPB 2.25 g  Status:  Discontinued        2.25 g 100 mL/hr over 30 Minutes Intravenous Every 6 hours 06/26/21 1604 06/28/21 0748   06/22/21 2100  ceFEPIme (MAXIPIME) 2 g in sodium chloride 0.9 % 100 mL IVPB  Status:  Discontinued        2 g 200 mL/hr over 30 Minutes Intravenous Every 24 hours 06/21/21 2331 06/26/21 1604   06/22/21 0600  metroNIDAZOLE (FLAGYL) IVPB 500 mg  Status:  Discontinued        500 mg 100 mL/hr over 60 Minutes Intravenous Every 8 hours 06/21/21 2331 06/26/21 1604   06/21/21 2100  ceFEPIme (MAXIPIME) 2 g in sodium chloride 0.9 % 100 mL IVPB        2 g 200 mL/hr over 30 Minutes Intravenous  Once 06/21/21 2050 06/21/21 2130   06/21/21 2100  metroNIDAZOLE (FLAGYL) IVPB 500 mg        500 mg 100 mL/hr over 60 Minutes Intravenous  Once 06/21/21 2050 06/22/21 1616       Objective   Vitals:   07/01/21 1646 07/01/21 1958 07/02/21 0031 07/02/21 0422  BP: 140/70 (!) 155/72 (!) 148/58 (!) 152/76  Pulse: (!) 54 74 82 81  Resp: 17 18 18 20   Temp: 98.3 F (36.8 C) 98 F (36.7 C) 98.2 F (36.8 C) 98.2 F (36.8 C)  TempSrc:      SpO2: 95% 97% 93% (!) 89%  Weight:      Height:        Intake/Output Summary (Last 24 hours) at 07/02/2021 0656 Last data filed at 07/02/2021 0034 Gross per 24 hour  Intake 240 ml  Output 810 ml  Net -570 ml    Filed Weights   06/22/21 2134 06/26/21  1549 06/27/21 0458  Weight: 96.2 kg 103.6 kg 104.4 kg    Patient BMI: Body mass index is 34.49 kg/m.   Physical Exam: General: Awake, NAD HEENT: moist mucous membranes Respiratory: normal respiratory effort. Mild rales. Clear lung sounds throughout Cardiovascular: normal S1/S2, RRR, no JVD, murmurs, rubs, gallops, quick capillary refill  Gastrointestinal: soft, NT, firm, no HSM felt Nervous: Alert and oriented. Able to follow simple commands. Extremities: Trace lower extremity edema Skin: dry, intact, normal temperature, normal color, No rashes, lesions or ulcers on exposed skin  Labs   I have personally reviewed following labs and imaging studies BMP Latest Ref Rng & Units 07/01/2021 06/30/2021 06/29/2021  Glucose 70 - 99 mg/dL 119(H) 118(H) 136(H)  BUN 8 - 23 mg/dL 42(H) 41(H) 43(H)  Creatinine 0.61 - 1.24 mg/dL 1.98(H) 2.22(H) 2.43(H)  Sodium 135 - 145 mmol/L 137 136 136  Potassium  3.5 - 5.1 mmol/L 3.9 3.8 3.2(L)  Chloride 98 - 111 mmol/L 102 103 105  CO2 22 - 32 mmol/L 29 28 26   Calcium 8.9 - 10.3 mg/dL 8.3(L) 8.1(L) 8.1(L)   CBC    Component Value Date/Time   WBC 17.6 (H) 06/28/2021 0323   RBC 3.14 (L) 06/28/2021 0323   HGB 9.5 (L) 06/28/2021 0323   HGB 12.8 (L) 03/19/2014 1049   HCT 28.5 (L) 06/28/2021 0323   HCT 37.7 (L) 03/19/2014 1049   PLT 243 06/28/2021 0323   PLT 137 (L) 03/19/2014 1049   MCV 90.8 06/28/2021 0323   MCV 97 03/19/2014 1049   MCH 30.3 06/28/2021 0323   MCHC 33.3 06/28/2021 0323   RDW 14.8 06/28/2021 0323   RDW 13.2 03/19/2014 1049   LYMPHSABS 0.5 (L) 06/27/2021 0334   MONOABS 1.6 (H) 06/27/2021 0334   EOSABS 0.1 06/27/2021 0334   BASOSABS 0.1 06/27/2021 0334    Imaging Studies  No results found. Medications   Scheduled Meds:  acidophilus  2 capsule Oral TID   amLODipine  5 mg Oral Daily   benzonatate  200 mg Oral TID   budesonide (PULMICORT) nebulizer solution  0.5 mg Nebulization Daily   Chlorhexidine Gluconate Cloth  6 each  Topical Daily   cholecalciferol  1,000 Units Oral Daily   escitalopram  10 mg Oral Daily   famotidine  10 mg Oral BID   feeding supplement  237 mL Oral TID BM   ferrous sulfate  325 mg Oral Q breakfast   heparin injection (subcutaneous)  5,000 Units Subcutaneous Q8H   mouth rinse  15 mL Mouth Rinse BID   multivitamin with minerals  1 tablet Oral Daily   traZODone  150 mg Oral QHS   No recently discontinued medications to reconcile  LOS: 11 days   Time spent: >43min  Macario Shear L Nykeem Citro, DO [facility] 07/02/2021, 6:56 AM   Please refer to amion to contact the [facility] Attending or Consulting provider for this pt  [email or web address] Available by Epic secure chat 7AM-7PM. If 7PM-7AM, please contact night-coverage

## 2021-07-02 NOTE — Progress Notes (Signed)
[facility]  PROGRESS NOTE   Subjective:   Wife and son in law are at bedside.   Patient states he became short of breath when eating his breakfast.   UOP 831mL  No new labs this morning.   Objective:  Vital signs in last 24 hours:  Temp:  [98 F (36.7 C)-98.7 F (37.1 C)] 98.7 F (37.1 C) (11/14 1210) Pulse Rate:  [54-91] 81 (11/14 1210) Resp:  [17-22] 17 (11/14 1210) BP: (140-158)/(58-79) 152/73 (11/14 1210) SpO2:  [89 %-97 %] 94 % (11/14 1210)  Weight change:  Filed Weights   06/22/21 2134 06/26/21 1549 06/27/21 0458  Weight: 96.2 kg 103.6 kg 104.4 kg    Intake/Output: I/O last 3 completed shifts: In: 1719.5 [P.O.:480; I.V.:220; IV Piggyback:1019.5] Out: 9563 [Urine:1460]   Intake/Output this shift:  Total I/O In: 480 [P.O.:480] Out: -   Physical Exam: General:  No acute distress  Head:  Normocephalic, atraumatic. Moist oral mucosal membranes  Eyes:  Anicteric  Neck:  Supple  Lungs:   Clear to auscultation, normal effort  Heart:  regular  Abdomen:   +midline incision with staples, clean and dry  Extremities:  peripheral edema.  Neurologic:  Awake, alert, following commands  Skin:  No lesions       Basic Metabolic Panel: Recent Labs  Lab 06/27/21 0334 06/28/21 0323 06/29/21 0426 06/30/21 0445 07/01/21 0554  NA 132* 133* 136 136 137  K 4.2 3.7 3.2* 3.8 3.9  CL 105 105 105 103 102  CO2 19* 21* 26 28 29   GLUCOSE 140* 135* 136* 118* 119*  BUN 61* 50* 43* 41* 42*  CREATININE 3.71* 2.98* 2.43* 2.22* 1.98*  CALCIUM 7.9* 8.0* 8.1* 8.1* 8.3*     CBC: Recent Labs  Lab 06/26/21 0429 06/26/21 1635 06/27/21 0334 06/28/21 0323  WBC 12.2* 14.6* 12.1* 17.6*  NEUTROABS 9.2* 11.2* 9.5*  --   HGB 8.9* 9.3* 8.8* 9.5*  HCT 27.9* 28.9* 26.7* 28.5*  MCV 96.2 96.0 92.7 90.8  PLT 206 231 213 243      Urinalysis: No results for input(s): COLORURINE, LABSPEC, PHURINE, GLUCOSEU, HGBUR, BILIRUBINUR, KETONESUR, PROTEINUR, UROBILINOGEN,  NITRITE, LEUKOCYTESUR in the last 72 hours.  Invalid input(s): APPERANCEUR    Imaging: No results found.   Medications:    sodium chloride 10 mL/hr at 06/26/21 1905   cefTRIAXone (ROCEPHIN)  IV 2 g (07/02/21 1414)    acidophilus  2 capsule Oral TID   amLODipine  5 mg Oral Daily   benzonatate  200 mg Oral TID   budesonide (PULMICORT) nebulizer solution  0.5 mg Nebulization Daily   Chlorhexidine Gluconate Cloth  6 each Topical Daily   cholecalciferol  1,000 Units Oral Daily   escitalopram  10 mg Oral Daily   famotidine  10 mg Oral BID   feeding supplement  237 mL Oral TID BM   ferrous sulfate  325 mg Oral Q breakfast   heparin injection (subcutaneous)  5,000 Units Subcutaneous Q8H   mouth rinse  15 mL Mouth Rinse BID   multivitamin with minerals  1 tablet Oral Daily   traZODone  150 mg Oral QHS    Assessment/ Plan:     Active Problems:   Acute diverticulitis   AKI (acute kidney injury) ([place])   Diverticulitis   Hypoxia   Perforation of intestine due to diverticulitis of gastrointestinal tract   HAP (hospital-acquired pneumonia)   Abdominal distension (gaseous)   Pressure injury of skin   Dyspnea  Mr.Trevor Come  Ferguson is a 87 y.o. white male with hypertension, coronary artery disease, hyperlipidemia, COPD, obstructive sleep apnea, who is admitted to [facility] on 06/21/2021 for Diverticulitis [K57.92] AKI (acute kidney injury) ([place]) [N17.9] Acute diverticulitis [K57.92] Perforation of intestine due to diverticulitis of gastrointestinal tract [K57.80]  Patient underwent Exlap with bowel resection on 06/22/2021.   Acute kidney injury on chronic kidney disease stage IIIB with proteinuria: baseline creatinine of 1.8, GFR of 35 on 03/28/21. Followed outpatient by me, Dr. Juleen China. Acute kidney injury secondary to prerenal azotemia and ATN. Nonoliguric urine output. Creatinine continues to improve. No indication for dialysis.   Anemia of chronic kidney disease: hemoglobin 9.5,  normocytic. No indication for ESA.   Hypertension with chronic kidney disease: 152/73. Currently on amlodipine. Holding home medications of torsemide and losartan.  Secondary Hyperparathyroidism: not currently on any vitamin D agents.    LOS: [place], [facility] 11/14/20223:49 PM

## 2021-07-02 NOTE — Plan of Care (Signed)
  Problem: Education: Goal: Knowledge of General Education information will improve Description: Including pain rating scale, medication(s)/side effects and non-pharmacologic comfort measures Outcome: Progressing   Problem: Health Behavior/Discharge Planning: Goal: Ability to manage health-related needs will improve Outcome: Progressing   Problem: Clinical Measurements: Goal: Ability to maintain clinical measurements within normal limits will improve Outcome: Progressing Goal: Respiratory complications will improve Outcome: Progressing   Problem: Elimination: Goal: Will not experience complications related to urinary retention Outcome: Progressing   Problem: Pain Managment: Goal: General experience of comfort will improve Outcome: Progressing   Problem: Safety: Goal: Ability to remain free from injury will improve Outcome: Progressing   Problem: Skin Integrity: Goal: Risk for impaired skin integrity will decrease Outcome: Progressing

## 2021-07-02 NOTE — Care Management Important Message (Signed)
Important Message  Patient Details  Name: Trevor Ferguson MRN: [id number] Date of Birth: 1926/07/15   Medicare Important Message Given:  Yes     [name] 07/02/2021, 2:58 PM

## 2021-07-02 NOTE — Plan of Care (Signed)
  Problem: Education: Goal: Knowledge of General Education information will improve Description: Including pain rating scale, medication(s)/side effects and non-pharmacologic comfort measures 07/02/2021 1250 by Cristela Blue, RN Outcome: Progressing 07/02/2021 1250 by Cristela Blue, RN Outcome: Progressing   Problem: Health Behavior/Discharge Planning: Goal: Ability to manage health-related needs will improve 07/02/2021 1250 by Cristela Blue, RN Outcome: Progressing 07/02/2021 1250 by Cristela Blue, RN Outcome: Progressing   Problem: Clinical Measurements: Goal: Ability to maintain clinical measurements within normal limits will improve 07/02/2021 1250 by Cristela Blue, RN Outcome: Progressing 07/02/2021 1250 by Cristela Blue, RN Outcome: Progressing Goal: Will remain free from infection 07/02/2021 1250 by Cristela Blue, RN Outcome: Progressing 07/02/2021 1250 by Cristela Blue, RN Outcome: Progressing Goal: Diagnostic test results will improve 07/02/2021 1250 by Cristela Blue, RN Outcome: Progressing 07/02/2021 1250 by Cristela Blue, RN Outcome: Progressing Goal: Respiratory complications will improve 07/02/2021 1250 by Cristela Blue, RN Outcome: Progressing 07/02/2021 1250 by Cristela Blue, RN Outcome: Progressing Goal: Cardiovascular complication will be avoided 07/02/2021 1250 by Cristela Blue, RN Outcome: Progressing 07/02/2021 1250 by Cristela Blue, RN Outcome: Progressing   Problem: Activity: Goal: Risk for activity intolerance will decrease 07/02/2021 1250 by Cristela Blue, RN Outcome: Progressing 07/02/2021 1250 by Cristela Blue, RN Outcome: Progressing   Problem: Nutrition: Goal: Adequate nutrition will be maintained 07/02/2021 1250 by Cristela Blue, RN Outcome: Progressing 07/02/2021 1250 by Cristela Blue, RN Outcome: Progressing   Problem: Coping: Goal: Level of anxiety will decrease 07/02/2021 1250 by Cristela Blue, RN Outcome:  Progressing 07/02/2021 1250 by Cristela Blue, RN Outcome: Progressing   Problem: Elimination: Goal: Will not experience complications related to bowel motility 07/02/2021 1250 by Cristela Blue, RN Outcome: Progressing 07/02/2021 1250 by Cristela Blue, RN Outcome: Progressing Goal: Will not experience complications related to urinary retention 07/02/2021 1250 by Cristela Blue, RN Outcome: Progressing 07/02/2021 1250 by Cristela Blue, RN Outcome: Progressing   Problem: Pain Managment: Goal: General experience of comfort will improve 07/02/2021 1250 by Cristela Blue, RN Outcome: Progressing 07/02/2021 1250 by Cristela Blue, RN Outcome: Progressing   Problem: Safety: Goal: Ability to remain free from injury will improve 07/02/2021 1250 by Cristela Blue, RN Outcome: Progressing 07/02/2021 1250 by Cristela Blue, RN Outcome: Progressing   Problem: Skin Integrity: Goal: Risk for impaired skin integrity will decrease 07/02/2021 1250 by Cristela Blue, RN Outcome: Progressing 07/02/2021 1250 by Cristela Blue, RN Outcome: Progressing

## 2021-07-02 NOTE — Progress Notes (Signed)
Physical Therapy Treatment Patient Details Name: Trevor Ferguson MRN: [id number] DOB: [birth year] Today's Date: 07/02/2021   History of Present Illness Patient is a 87 year old male with PMH of COPD, prostate cancer, HTN, sleep apnea, stage IIIB CKD and CVA. He presented to ER with acute onset of R lower quadrant abdominal pain with nausea and dry heaves.  Patient underwent exploratory laparotomy with small bowl resection on 06/22/21. Patient fell on 06/23/21 while in hospital. Patient came from [facility] SNF    PT Comments    Pt demonstrating great improvements in his mobility today throughout session. Pt able to complete bed mobility without physical assistance + HOB elevated. Pt demonstrating improvements with sit to stand transfers from slightly elevated surface requiring only minA to complete with RW. Pt completed stand step transfer from bed to recliner chair with CGA throughout. Pt did desaturate to 85% SpO2 during transfer and required extended rest break and cues for PLB to improve to 91% in sitting. Pt will continue to benefit from skilled PT services to improve overall activity tolerance and improve safety with mobility to reduce likelihood of falls. Recommending return to STR at discharge.     Recommendations for follow up therapy are one component of a multi-disciplinary discharge planning process, led by the attending physician.  Recommendations may be updated based on patient status, additional functional criteria and insurance authorization.  Follow Up Recommendations  Skilled nursing-short term rehab (<3 hours/day)     Assistance Recommended at Discharge Intermittent Supervision/Assistance  Equipment Recommendations  None recommended by PT    Recommendations for Other Services       Precautions / Restrictions Precautions Precautions: Fall Restrictions Weight Bearing Restrictions: No     Mobility  Bed Mobility Overal bed mobility: Modified Independent Bed Mobility:  Supine to Sit     Supine to sit: Modified independent (Device/Increase time);HOB elevated     General bed mobility comments: Pt able to complete bed mobility without physical assistance today with HOB elevated. Pt using the bed rails and cued for scooting forward at EOB    Transfers Overall transfer level: Needs assistance Equipment used: Rolling Space (2 wheels) Transfers: Sit to/from Stand;Bed to chair/wheelchair/BSC Sit to Stand: From elevated surface;Min assist     Step pivot transfers: Min guard     General transfer comment: MinA for sit to stand from slightly elevated bed surface. CGA for stand step transfer to chair with usage of RW. PT oxygen saturated to 85% during transfer and improved with extened seated rest break and cues for pursed lip breathing    Ambulation/Gait               General Gait Details: Further ambulation deferred due to increased SOB with short transfer   Stairs             Wheelchair Mobility    Modified Rankin (Stroke Patients Only)       Balance Overall balance assessment: Needs assistance Sitting-balance support: Feet supported;No upper extremity supported Sitting balance-Leahy Scale: Good     Standing balance support: Bilateral upper extremity supported;During functional activity;Reliant on assistive device for balance Standing balance-Leahy Scale: Fair Standing balance comment: Improved balance in standing with RW. Pt with slightly posterior lean however, still able to complete transfer without loss of balance noted throughout                            Cognition Arousal/Alertness: Awake/alert Behavior During Therapy:  WFL for tasks assessed/performed Overall Cognitive Status: Within Functional Limits for tasks assessed                                 General Comments: Improved alertness and more conversational today        Exercises Other Exercises Other Exercises: Pt completed 1 sit to  stand from recliner chair with cues for pushing up off of the armrests and required only minA to complete. Pt left seated in recliner chair with oxygen saturation 91% and cued for pursed lip breathing. Pt left with all needs within reach and no further questions at this time.    General Comments        Pertinent Vitals/Pain Pain Assessment: No/denies pain    Home Living                          Prior Function            PT Goals (current goals can now be found in the care plan section) Acute Rehab PT Goals Patient Stated Goal: to return back to SNF PT Goal Formulation: With patient Time For Goal Achievement: 07/08/21 Potential to Achieve Goals: Fair Progress towards PT goals: Progressing toward goals    Frequency    Min 2X/week      PT Plan Current plan remains appropriate    Co-evaluation              AM-PAC PT "6 Clicks" Mobility   Outcome Measure  Help needed turning from your back to your side while in a flat bed without using bedrails?: None Help needed moving from lying on your back to sitting on the side of a flat bed without using bedrails?: A Little Help needed moving to and from a bed to a chair (including a wheelchair)?: A Little Help needed standing up from a chair using your arms (e.g., wheelchair or bedside chair)?: A Little Help needed to walk in hospital room?: Total Help needed climbing 3-5 steps with a railing? : Total 6 Click Score: 15    End of Session Equipment Utilized During Treatment: Gait belt;Oxygen Activity Tolerance: Patient tolerated treatment well;Patient limited by fatigue Patient left: in chair;with call bell/phone within reach;with chair alarm set Nurse Communication: Mobility status PT Visit Diagnosis: Unsteadiness on feet (R26.81);Other abnormalities of gait and mobility (R26.89);Repeated falls (R29.6);Muscle weakness (generalized) (M62.81);Difficulty in walking, not elsewhere classified (R26.2);History of falling  (Z91.81);Pain     Time: 5916-3846 PT Time Calculation (min) (ACUTE ONLY): 17 min  Charges:  $Therapeutic Activity: 8-22 mins                     Andrey Campanile, SPT   Andrey Campanile 07/02/2021, 2:59 PM

## 2021-07-02 NOTE — Progress Notes (Signed)
Daily Progress Note   Patient Name: Trevor Ferguson       Date: 07/02/2021 DOB: August 29, 1925  Age: 87 y.o. MRN#: [id number] Attending Physician: Richarda Osmond, MD Primary Care Physician: [name], MD Admit Date: 06/21/2021 Length of Stay: 11 days  Reason for Consultation/Follow-up: Establishing goals of care  HPI/Patient Profile:  87 y.o. male  with past medical history of HTN, sleep apnea, COPD, CVA, CKD IIIb admitted on 06/21/2021 with diverticulitis, AKI on CKD, SB perforation. He is s/p small bowel resection and anastamosis. Recovery has been complicated by mental status changes and metabolic acidosis, post-op ileus (now resolved). Lives at [facility] with his wife. Palliative medicine consulted for [place].     Subjective:   Subjective: Chart Reviewed. Updates received. Patient Assessed. Created space and opportunity for patient  and family to explore thoughts and feelings regarding current medical situation.  Today's Discussion: Today I met with the the patient, the patient's wife, and the patient's son at the bedside.  The patient states he is doing better.  She feels somewhat worse today than he did yesterday.  He has had multiple soft stools, but no watery stools.  His breathing is "not as good as I would like" but overall acceptable.    Explained palliative care services and clarified the difference between palliative care and hospice.  They feel that he is doing better, and plans are to return to their nursing home where they live.  They do not feel hospice care is appropriate at this time.  Based on this discussion they have elected outpatient palliative care services in place at their nursing home/home.  Review of Systems  Constitutional:  Positive for activity change and fatigue.  Respiratory:  Positive for shortness of breath (mild).   Cardiovascular:  Negative for chest pain.  Gastrointestinal:  Negative for diarrhea (soft stools ([place] 5-6)), nausea  and vomiting.   Objective:   Vital Signs:  BP (!) 158/79 (BP Location: Left Arm)   Pulse 82   Temp 98.7 F (37.1 C)   Resp (!) 22   Ht 5' 8.5" (1.74 m)   Wt 104.4 kg   SpO2 90%   BMI 34.49 kg/m   Physical Exam: Physical Exam Vitals and nursing note reviewed.  Constitutional:      General: He is not in acute distress.    Appearance: He is ill-appearing.  HENT:     Head: Normocephalic and atraumatic.  Cardiovascular:     Rate and Rhythm: Normal rate.  Pulmonary:     Effort: Pulmonary effort is normal. No respiratory distress.     Breath sounds: No wheezing or rhonchi.  Abdominal:     General: Abdomen is flat.     Palpations: Abdomen is soft.     Comments: Noted abdominal incision CDI  Skin:    General: Skin is warm and dry.  Neurological:     Mental Status: He is alert.  Psychiatric:        Mood and Affect: Mood normal.        Behavior: Behavior normal.    Palliative Assessment/Data: 40-50%   Assessment & Plan:   Impression: Present on Admission:  Acute diverticulitis  SUMMARY OF RECOMMENDATIONS   TOC referral for outpatient Palliative Care Remain DNR Continue full scope of care otherwise PMT will continue to follow peripherally Please let us know if any changes or any new needs  Code Status: DNR  Prognosis: Unable to determine  Discharge Planning: To Be  Determined  Discussed with: Medical team, nursing team, [facility] team  Thank you for allowing [place] to participate in the care of JERAME HEDDING PMT will continue to support holistically.  Time Total: 35 min  Visit consisted of counseling and education dealing with the complex and emotionally intense issues of symptom management and palliative care in the setting of serious and potentially life-threatening illness. Greater than 50%  of this time was spent counseling and coordinating care related to the above assessment and plan.  Walden Field, NP Palliative Medicine Team  Team Phone # [phone]  (Nights/Weekends)  04/17/2021, 8:17 AM

## 2021-07-03 DIAGNOSIS — L89300 Pressure ulcer of unspecified buttock, unstageable: Secondary | ICD-10-CM

## 2021-07-03 LAB — BASIC METABOLIC PANEL
Anion gap: 7 (ref 5–15)
BUN: 43 mg/dL — ABNORMAL HIGH (ref 8–23)
CO2: 29 mmol/L (ref 22–32)
Calcium: 8.7 mg/dL — ABNORMAL LOW (ref 8.9–10.3)
Chloride: 104 mmol/L (ref 98–111)
Creatinine, Ser: 1.89 mg/dL — ABNORMAL HIGH (ref 0.61–1.24)
GFR, Estimated: 33 mL/min — ABNORMAL LOW (ref >60)
Glucose, Bld: 116 mg/dL — ABNORMAL HIGH (ref 70–99)
Potassium: 4.6 mmol/L (ref 3.5–5.1)
Sodium: 140 mmol/L (ref 135–145)

## 2021-07-03 LAB — CBC
HCT: 26.5 % — ABNORMAL LOW (ref 39.0–52.0)
Hemoglobin: 9 g/dL — ABNORMAL LOW (ref 13.0–17.0)
MCH: 31.7 pg (ref 26.0–34.0)
MCHC: 34 g/dL (ref 30.0–36.0)
MCV: 93.3 fL (ref 80.0–100.0)
Platelets: 214 10*3/uL (ref 150–400)
RBC: 2.84 MIL/uL — ABNORMAL LOW (ref 4.22–5.81)
RDW: 15.1 % (ref 11.5–15.5)
WBC: 14.2 10*3/uL — ABNORMAL HIGH (ref 4.0–10.5)
nRBC: 0 % (ref 0.0–0.2)

## 2021-07-03 LAB — RESP PANEL BY RT-PCR (FLU A&B, COVID) ARPGX2
Influenza A by PCR: NEGATIVE
Influenza B by PCR: NEGATIVE
SARS Coronavirus 2 by RT PCR: NEGATIVE

## 2021-07-03 MED ORDER — AMLODIPINE BESYLATE 10 MG PO TABS
10.0000 mg | ORAL_TABLET | Freq: Every day | ORAL | Status: DC
  Administered 2021-07-03 – 2021-07-12 (×10): 10 mg via ORAL
  Filled 2021-07-03 (×10): qty 1

## 2021-07-03 MED ORDER — LOSARTAN POTASSIUM 50 MG PO TABS
100.0000 mg | ORAL_TABLET | Freq: Every day | ORAL | Status: DC
  Administered 2021-07-04 – 2021-07-12 (×9): 100 mg via ORAL
  Filled 2021-07-03 (×9): qty 2

## 2021-07-03 NOTE — Progress Notes (Signed)
PROGRESS NOTE  Trevor Ferguson    DOB: Nov 29, 1925, 87 y.o.  HUT:[id number]  PCP: Trevor Carbon, MD   Code Status: DNR   DOA: 06/21/2021   LOS: 12  Brief Narrative of Current Hospitalization  Trevor Ferguson is a 87 y.o. male with a PMH significant for COPD, HTN, HLD, sleep apnea, CKD 3B, CVA. They presented from SNF to the ED on 06/21/2021 with abdominal pain x a few days. In the ED, it was found that they had small bowel diverticulitis with possible microperforation.  General surgery was consulted. 11/4 patient underwent exploratory laparotomy with small bowel resection 11/6 postoperative ileus 11/8 having bowel movements, 3 L O2 requirement, increased lethargy 11/10 O2 requirement 6-5K, metabolic acidosis improving with bicarb gtt, unable to tolerate PO 11/12 mental status improved. Metabolic acidosis resolved. Able to dc bicarb drip  07/03/21 -stable, improved  Assessment & Plan  Active Problems:   Acute diverticulitis   AKI (acute kidney injury) ([place])   Diverticulitis   Hypoxia   Perforation of intestine due to diverticulitis of gastrointestinal tract   HAP (hospital-acquired pneumonia)   Abdominal distension (gaseous)   Pressure injury of skin   Dyspnea  Acute small bowel diverticulitis- s/p resection 81/2 complicated by postoperative ileus.  Now having diarrhea, suspect related to Abx use. Tolerating soft diet without abdominal pain or nausea. -Surgery following, appreciate recommendations -tolerating diet well. Continue soft diet until OP surgery follow up -Zosyn per general surgery (11/8-11/10), flagyl (11/4-11/8) - gentle bowel regimen, imodium PRN  Acute hypoxic respiratory failure 2/2 atelectasis versus early pneumonia  COPD - stable alertness today. Persistent oxygen requirement. Radiologist read recommends f/u chest imaging in 3-4 weeks to exclude underlying malignancy. Family was made aware. Patient endorses using breathing treatment and on 2L O2 at baseline.  Will likely need new higher requirement on discharge. -continue respiratory coverage antibiotics.  Has documented penicillin allergy  - azithromycin (11/9-11/14)  - CTX (11/12-11/16) -DuoNebs twice daily -Robinul twice daily - wean to 2L as tolerated - PT/OT  Encephalopathy- resolved. Conversant and oriented today. - palliative consulted.   - meeting with family to discuss [place]- continue current course of treatment and dc with hospice/palliative to facility - continue PT/OT - attempt to get out of bed and limit interference to avoid delirium.   AKI on CKD IIIb  metabolic acidosis (resolved)  mild hypokalemia (resolved)- improving. Cr 3.7>>>1.98. serum bicarb 29, K+ 3.9 -Nephrology following, appreciate recommendations -Monitor serum bicarb daily, BMP am - replete electrolytes PRN  Primary HTN-well-controlled with baseline low diastolic readings -Continue on Norvasc- increased to 10mg  -Holding home ACE inhibitor due to kidney function  Depression -Continue home Lexapro  GERD -Continue home PPI  Chronic iron deficiency anemia -Continue home iron  DVT prophylaxis: heparin injection 5,000 Units Start: 06/27/21 0600  Diet:  Diet Orders (From admission, onward)     Start     Ordered   06/30/21 1443  DIET SOFT Room service appropriate? Yes with Assist; Fluid consistency: Thin  Diet effective now       Comments: Please CUT the meats and add Gravy;  NO STRAWS!!! CREAM SOUPS ONLY  Question Answer Comment  Room service appropriate? Yes with Assist   Fluid consistency: Thin      06/30/21 1443            Subjective 07/03/21    Pt laying in bed today. Denies complaints.   Disposition Plan & Communication  Patient status: Inpatient  Admitted From: SNF Disposition:  Skilled nursing facility Anticipated discharge date: 11/15 or 11/16  Family Communication: none Consults, Procedures, Significant Events  Consultants:  Nephrology General surgery Palliative    Procedures/significant events:  Small bowel resection 11/4  Antimicrobials:  Anti-infectives (From admission, onward)    Start     Dose/Rate Route Frequency Ordered Stop   06/30/21 1300  cefTRIAXone (ROCEPHIN) 2 g in sodium chloride 0.9 % 100 mL IVPB        2 g 200 mL/hr over 30 Minutes Intravenous Every 24 hours 06/30/21 1205 07/04/21 1259   06/27/21 1400  azithromycin (ZITHROMAX) 250 mg in dextrose 5 % 125 mL IVPB        250 mg 125 mL/hr over 60 Minutes Intravenous Every 24 hours 06/27/21 1239 07/01/21 1759   06/26/21 1800  piperacillin-tazobactam (ZOSYN) IVPB 2.25 g  Status:  Discontinued        2.25 g 100 mL/hr over 30 Minutes Intravenous Every 6 hours 06/26/21 1604 06/28/21 0748   06/22/21 2100  ceFEPIme (MAXIPIME) 2 g in sodium chloride 0.9 % 100 mL IVPB  Status:  Discontinued        2 g 200 mL/hr over 30 Minutes Intravenous Every 24 hours 06/21/21 2331 06/26/21 1604   06/22/21 0600  metroNIDAZOLE (FLAGYL) IVPB 500 mg  Status:  Discontinued        500 mg 100 mL/hr over 60 Minutes Intravenous Every 8 hours 06/21/21 2331 06/26/21 1604   06/21/21 2100  ceFEPIme (MAXIPIME) 2 g in sodium chloride 0.9 % 100 mL IVPB        2 g 200 mL/hr over 30 Minutes Intravenous  Once 06/21/21 2050 06/21/21 2130   06/21/21 2100  metroNIDAZOLE (FLAGYL) IVPB 500 mg        500 mg 100 mL/hr over 60 Minutes Intravenous  Once 06/21/21 2050 06/22/21 1616       Objective   Vitals:   07/02/21 1210 07/02/21 1618 07/02/21 1946 07/03/21 0444  BP: (!) 152/73 (!) 151/57 (!) 155/86 (!) 167/60  Pulse: 81 80 75 89  Resp: 17 20 20 20   Temp: 98.7 F (37.1 C) 98.7 F (37.1 C) 98.2 F (36.8 C) 98.1 F (36.7 C)  TempSrc:  Oral Oral Oral  SpO2: 94% 98% 98% 96%  Weight:      Height:        Intake/Output Summary (Last 24 hours) at 07/03/2021 0654 Last data filed at 07/03/2021 0514 Gross per 24 hour  Intake 960 ml  Output 1100 ml  Net -140 ml    Filed Weights   06/22/21 2134 06/26/21 1549  06/27/21 0458  Weight: 96.2 kg 103.6 kg 104.4 kg    Patient BMI: Body mass index is 34.49 kg/m.   Physical Exam: General: Awake, NAD HEENT: moist mucous membranes Respiratory: normal respiratory effort. Clear lung sounds throughout Cardiovascular: normal S1/S2, RRR, no JVD, murmurs, rubs, gallops, quick capillary refill  Gastrointestinal: soft, NT, firm, no HSM felt Nervous: Alert and oriented. Able to follow simple commands. Extremities: Trace lower extremity edema Skin: dry, intact, normal temperature, normal color, No rashes, lesions or ulcers on exposed skin  Labs   I have personally reviewed following labs and imaging studies BMP Latest Ref Rng & Units 07/01/2021 06/30/2021 06/29/2021  Glucose 70 - 99 mg/dL 119(H) 118(H) 136(H)  BUN 8 - 23 mg/dL 42(H) 41(H) 43(H)  Creatinine 0.61 - 1.24 mg/dL 1.98(H) 2.22(H) 2.43(H)  Sodium 135 - 145 mmol/L 137 136 136  Potassium 3.5 - 5.1 mmol/L 3.9 3.8 3.2(L)  Chloride 98 - 111 mmol/L 102 103 105  CO2 22 - 32 mmol/L 29 28 26   Calcium 8.9 - 10.3 mg/dL 8.3(L) 8.1(L) 8.1(L)   CBC    Component Value Date/Time   WBC 17.6 (H) 06/28/2021 0323   RBC 3.14 (L) 06/28/2021 0323   HGB 9.5 (L) 06/28/2021 0323   HGB 12.8 (L) 03/19/2014 1049   HCT 28.5 (L) 06/28/2021 0323   HCT 37.7 (L) 03/19/2014 1049   PLT 243 06/28/2021 0323   PLT 137 (L) 03/19/2014 1049   MCV 90.8 06/28/2021 0323   MCV 97 03/19/2014 1049   MCH 30.3 06/28/2021 0323   MCHC 33.3 06/28/2021 0323   RDW 14.8 06/28/2021 0323   RDW 13.2 03/19/2014 1049   LYMPHSABS 0.5 (L) 06/27/2021 0334   MONOABS 1.6 (H) 06/27/2021 0334   EOSABS 0.1 06/27/2021 0334   BASOSABS 0.1 06/27/2021 0334    Imaging Studies  No results found. Medications   Scheduled Meds:  acidophilus  2 capsule Oral TID   amLODipine  5 mg Oral Daily   benzonatate  200 mg Oral TID   budesonide (PULMICORT) nebulizer solution  0.5 mg Nebulization Daily   Chlorhexidine Gluconate Cloth  6 each Topical Daily    cholecalciferol  1,000 Units Oral Daily   escitalopram  10 mg Oral Daily   famotidine  10 mg Oral BID   feeding supplement  237 mL Oral TID BM   ferrous sulfate  325 mg Oral Q breakfast   heparin injection (subcutaneous)  5,000 Units Subcutaneous Q8H   mouth rinse  15 mL Mouth Rinse BID   multivitamin with minerals  1 tablet Oral Daily   traZODone  150 mg Oral QHS   No recently discontinued medications to reconcile  LOS: 12 days   Time spent: >49min  Aragorn Recker L Vishal Sandlin, DO [facility] 07/03/2021, 6:54 AM   Please refer to amion to contact the [facility] Attending or Consulting provider for this pt  [email or web address] Available by Epic secure chat 7AM-7PM. If 7PM-7AM, please contact night-coverage

## 2021-07-03 NOTE — Progress Notes (Signed)
Patient ID: Trevor Ferguson, male   DOB: 1926-01-14, 87 y.o.   MRN: [id number]     [facility] Day(s): 12.   Interval History: Patient seen and examined, no acute events or new complaints overnight. Patient reports feeling okay.  He denies any abdominal pain.  He endorses that he had a good bowel movement today.  He has been tolerating diet.  Vital signs in last 24 hours: [min-max] current  Temp:  [97.8 F (36.6 C)-98.7 F (37.1 C)] 97.8 F (36.6 C) (11/15 0810) Pulse Rate:  [65-89] 65 (11/15 0810) Resp:  [18-20] 18 (11/15 0713) BP: (151-167)/(57-86) 164/62 (11/15 0810) SpO2:  [95 %-98 %] 96 % (11/15 0810)     Height: 5' 8.5" (174 cm) Weight: 104.4 kg BMI (Calculated): 34.48   Physical Exam:  Constitutional: alert, cooperative and no distress  Respiratory: breathing non-labored at rest  Cardiovascular: regular rate and sinus rhythm  Gastrointestinal: soft, non-tender, and non-distended.  Wounds are dry and clean  Labs:  CBC Latest Ref Rng & Units 07/03/2021 06/28/2021 06/27/2021  WBC 4.0 - 10.5 K/uL 14.2(H) 17.6(H) 12.1(H)  Hemoglobin 13.0 - 17.0 g/dL 9.0(L) 9.5(L) 8.8(L)  Hematocrit 39.0 - 52.0 % 26.5(L) 28.5(L) 26.7(L)  Platelets 150 - 400 K/uL 214 243 213   CMP Latest Ref Rng & Units 07/03/2021 07/01/2021 06/30/2021  Glucose 70 - 99 mg/dL 116(H) 119(H) 118(H)  BUN 8 - 23 mg/dL 43(H) 42(H) 41(H)  Creatinine 0.61 - 1.24 mg/dL 1.89(H) 1.98(H) 2.22(H)  Sodium 135 - 145 mmol/L 140 137 136  Potassium 3.5 - 5.1 mmol/L 4.6 3.9 3.8  Chloride 98 - 111 mmol/L 104 102 103  CO2 22 - 32 mmol/L 29 29 28   Calcium 8.9 - 10.3 mg/dL 8.7(L) 8.3(L) 8.1(L)  Total Protein 6.5 - 8.1 g/dL - - -  Total Bilirubin 0.3 - 1.2 mg/dL - - -  Alkaline Phos 38 - 126 U/L - - -  AST 15 - 41 U/L - - -  ALT 0 - 44 U/L - - -    Imaging studies: No new pertinent imaging studies   Assessment/Plan:  87 y.o. male with diverticulitis of small intestine with perforation 12 Day Post-Op s/p  small bowel resection and anastomosis, complicated by pertinent comorbidities including COPD, hypertension, stage IIIb chronic kidney disease, history of CVA, sleep apnea.  Patient continues to recover adequately from surgical standpoint.  He is tolerating soft diet.  He is having regular bowel movement.  He denies any abdominal pain.  Continue with stable renal function.  From surgical standpoint patient can be discharged when medically stable.  I will remove the staples at the postop visit.  I will continue to follow while the patient is in-house.  Arnold Long, MD

## 2021-07-03 NOTE — Progress Notes (Signed)
[facility]  PROGRESS NOTE   Subjective:   Patient is concerned about his stool being black in color. He is on iron supplements.   UOP 114mL.  Creatinine 1.89  Objective:  Vital signs in last 24 hours:  Temp:  [97.8 F (36.6 C)-98.7 F (37.1 C)] 97.8 F (36.6 C) (11/15 0810) Pulse Rate:  [65-89] 65 (11/15 0810) Resp:  [18-20] 18 (11/15 0713) BP: (151-167)/(57-86) 164/62 (11/15 0810) SpO2:  [95 %-98 %] 96 % (11/15 0810)  Weight change:  Filed Weights   06/22/21 2134 06/26/21 1549 06/27/21 0458  Weight: 96.2 kg 103.6 kg 104.4 kg    Intake/Output: I/O last 3 completed shifts: In: 960 [P.O.:960] Out: 1360 [Urine:1360]   Intake/Output this shift:  Total I/O In: 480 [P.O.:480] Out: -   Physical Exam: General:  No acute distress  Head:  Normocephalic, atraumatic. Moist oral mucosal membranes  Eyes:  Anicteric  Neck:  Supple  Lungs:   Clear to auscultation, normal effort  Heart:  regular  Abdomen:   +midline incision with staples, clean and dry  Extremities:  No peripheral edema.  Neurologic:  Awake, alert, following commands  Skin:  No lesions       Basic Metabolic Panel: Recent Labs  Lab 06/28/21 0323 06/29/21 0426 06/30/21 0445 07/01/21 0554 07/03/21 0827  NA 133* 136 136 137 140  K 3.7 3.2* 3.8 3.9 4.6  CL 105 105 103 102 104  CO2 21* 26 28 29 29   GLUCOSE 135* 136* 118* 119* 116*  BUN 50* 43* 41* 42* 43*  CREATININE 2.98* 2.43* 2.22* 1.98* 1.89*  CALCIUM 8.0* 8.1* 8.1* 8.3* 8.7*     CBC: Recent Labs  Lab 06/26/21 1635 06/27/21 0334 06/28/21 0323 07/03/21 0827  WBC 14.6* 12.1* 17.6* 14.2*  NEUTROABS 11.2* 9.5*  --   --   HGB 9.3* 8.8* 9.5* 9.0*  HCT 28.9* 26.7* 28.5* 26.5*  MCV 96.0 92.7 90.8 93.3  PLT 231 213 243 214      Urinalysis: No results for input(s): COLORURINE, LABSPEC, PHURINE, GLUCOSEU, HGBUR, BILIRUBINUR, KETONESUR, PROTEINUR, UROBILINOGEN, NITRITE, LEUKOCYTESUR in the last 72 hours.  Invalid input(s):  APPERANCEUR    Imaging: No results found.   Medications:    sodium chloride 10 mL/hr at 06/26/21 1905   cefTRIAXone (ROCEPHIN)  IV 2 g (07/02/21 1414)    acidophilus  2 capsule Oral TID   amLODipine  10 mg Oral Daily   benzonatate  200 mg Oral TID   budesonide (PULMICORT) nebulizer solution  0.5 mg Nebulization Daily   Chlorhexidine Gluconate Cloth  6 each Topical Daily   cholecalciferol  1,000 Units Oral Daily   escitalopram  10 mg Oral Daily   famotidine  10 mg Oral BID   feeding supplement  237 mL Oral TID BM   ferrous sulfate  325 mg Oral Q breakfast   heparin injection (subcutaneous)  5,000 Units Subcutaneous Q8H   mouth rinse  15 mL Mouth Rinse BID   multivitamin with minerals  1 tablet Oral Daily   traZODone  150 mg Oral QHS    Assessment/ Plan:     Active Problems:   Acute diverticulitis   AKI (acute kidney injury) ([place])   Diverticulitis   Hypoxia   Perforation of intestine due to diverticulitis of gastrointestinal tract   HAP (hospital-acquired pneumonia)   Abdominal distension (gaseous)   Pressure injury of skin   Dyspnea  Mr.Trevor Ferguson is a 87 y.o. white male with hypertension, coronary  artery disease, hyperlipidemia, COPD, obstructive sleep apnea, who is admitted to [facility] on 06/21/2021 for Diverticulitis [K57.92] AKI (acute kidney injury) ([place]) [N17.9] Acute diverticulitis [K57.92] Perforation of intestine due to diverticulitis of gastrointestinal tract [K57.80]  Patient underwent Exlap with bowel resection on 06/22/2021.   Acute kidney injury on chronic kidney disease stage IIIB with proteinuria: baseline creatinine of 1.8, GFR of 35 on 03/28/21. Followed outpatient by me, Dr. Juleen China. Acute kidney injury secondary to prerenal azotemia and ATN. Nonoliguric urine output. Creatinine close to baseline. No indication for dialysis.   Anemia of chronic kidney disease: hemoglobin 9, normocytic, trending down. No indication for ESA. History of iron  deficiency. Stool to be checked as per Hospitalist.  - Continue ferrous sulfate.   Hypertension with chronic kidney disease: 164/62 - elevated. Currently on amlodipine. Holding home medications of torsemide and losartan. - restart losartan tomorrow.   Secondary Hyperparathyroidism: not currently on any vitamin D agents.    LOS: [place], [facility] 11/15/20221:21 PM

## 2021-07-03 NOTE — TOC Progression Note (Addendum)
Transition of Care [facility]) - Progression Note    Patient Details  Name: Trevor Ferguson MRN: [id number] Date of Birth: 05-Dec-1925  Transition of Care [facility]) CM/SW Contact  Beverly Sessions, RN Phone Number: 07/03/2021, 3:08 PM  Clinical Narrative:     Per MD peer to peer was denied  Plan for patient to return [place] tomorrow. Updated Seth Bake at [facility]  VM left for daughter   Expected Discharge Plan: Long Term Nursing Home Barriers to Discharge: Continued Medical Work up  Expected Discharge Plan and Services Expected Discharge Plan: [place]       Living arrangements for the past 2 months: [place]                                       Social Determinants of Health (SDOH) Interventions    Readmission Risk Interventions Readmission Risk Prevention Plan 06/23/2021 01/29/2021  Transportation Screening Complete Complete  PCP or Specialist Appt within 3-5 Days Complete Complete  HRI or Home Care Consult Complete Complete  Social Work Consult for [place] Planning/Counseling Complete Complete  Palliative Care Screening Not Applicable Not Applicable  Medication Review [employer]) Complete Complete  Some recent data might be hidden

## 2021-07-03 NOTE — TOC Progression Note (Signed)
Transition of Care [facility]) - Progression Note    Patient Details  Name: Trevor Ferguson MRN: [id number] Date of Birth: 1926/03/11  Transition of Care [facility]) CM/SW Contact  Beverly Sessions, RN Phone Number: 07/03/2021, 10:14 AM  Clinical Narrative:    Notified by MD that patient medically ready for discharge Auth started for [facility] through [place] portal  Per Seth Bake at [facility] Patient can admit today if Josem Kaufmann obtained MD to order covid test    Expected Discharge Plan: Long Term Nursing Home Barriers to Discharge: Continued Medical Work up  Expected Discharge Plan and Services Expected Discharge Plan: [place]       Living arrangements for the past 2 months: [place]                                       Social Determinants of Health (SDOH) Interventions    Readmission Risk Interventions Readmission Risk Prevention Plan 06/23/2021 01/29/2021  Transportation Screening Complete Complete  PCP or Specialist Appt within 3-5 Days Complete Complete  HRI or [place] Complete Complete  Social Work Consult for [place] Planning/Counseling Complete Complete  Palliative Care Screening Not Applicable Not Applicable  Medication Review Press photographer) Complete Complete  Some recent data might be hidden

## 2021-07-03 NOTE — Progress Notes (Signed)
Nutrition Follow-up  DOCUMENTATION CODES:   Obesity unspecified  INTERVENTION:   -Continue MVI with minerals daily -Decrease Ensure Enlive po to BID, each supplement provides 350 kcal and 20 grams of protein  -D/c Hormel Shake -Continue feeding assistance with meals  NUTRITION DIAGNOSIS:   Inadequate oral intake related to acute illness as evidenced by other (comment) (pt on clear liquid diet).  Progressing; advanced to soft diet on 06/30/21  GOAL:   Patient will meet greater than or equal to 90% of their needs  Progressing   MONITOR:   PO intake, Supplement acceptance, Labs, Weight trends, Skin, I & O's, Diet advancement  REASON FOR ASSESSMENT:   NPO/Clear Liquid Diet    ASSESSMENT:   87 y/o male with h/o COPD, hypertension, prostate cancer, stage IIIb chronic kidney disease, CVA and sleep apnea who is admitted with peforated diverticulits now s/p ex lap with small bowel resection with removal of 33cm of jejunum on 29/5 complicated by post op ileus.  11/4- s/p Exploratory laparotomy with small bowel resection 11/5- pt pulled out NGT, advanced to clear liquid diet 11/9- advanced to full liquid diet 11/12- s/p BSE- advanced to a mechanical soft diet  Reviewed I/O's: -140 ml x 24 hours and +9.9 L since admission  UOP: 1.1 L x 24 hours  Pt unavailable at time of visit. Attempted to speak with pt via call to hospital room phone, however, unable to reach.  Pt with improved oral intake. Noted meal completions 25-100%. Pt is tolerating current diet texture well.   Per TOC notes, pt awaiting insurance authorization for SNF placement.   Medications reviewed and include vitamin D3 and ferrous sulfate.   Labs reviewed: CBGS: 123-124 (inpatient orders for glycemic control are none).    Diet Order:   Diet Order             DIET SOFT Room service appropriate? Yes with Assist; Fluid consistency: Thin  Diet effective now                   EDUCATION NEEDS:   Not  appropriate for education at this time  Skin:  Skin Assessment: Skin Integrity Issues: Skin Integrity Issues:: Incisions Incisions: closed abdomen  Last BM:  06/25/21  Height:   Ht Readings from Last 1 Encounters:  06/22/21 5' 8.5" (1.74 m)    Weight:   Wt Readings from Last 1 Encounters:  06/27/21 104.4 kg    Ideal Body Weight:  71.36 kg  BMI:  Body mass index is 34.49 kg/m.  Estimated Nutritional Needs:   Kcal:  1900-2200kcal/day  Protein:  95-110g/day  Fluid:  1.8-2.1L/day    Loistine Chance, RD, LDN, [place] Registered Dietitian II Certified Diabetes Care and Education Specialist Please refer to AMION for RD and/or RD on-call/weekend/after hours pager

## 2021-07-03 NOTE — TOC Progression Note (Signed)
Transition of Care [facility]) - Progression Note    Patient Details  Name: Trevor Ferguson MRN: [id number] Date of Birth: 01-20-1926  Transition of Care [facility]) CM/SW Contact  Beverly Sessions, RN Phone Number: 07/03/2021, 1:12 PM  Clinical Narrative:     Josem Kaufmann still pending   Expected Discharge Plan: Long Term Nursing Home Barriers to Discharge: Continued Medical Work up  Expected Discharge Plan and Services Expected Discharge Plan: [place]       Living arrangements for the past 2 months: [place]                                       Social Determinants of Health (SDOH) Interventions    Readmission Risk Interventions Readmission Risk Prevention Plan 06/23/2021 01/29/2021  Transportation Screening Complete Complete  PCP or Specialist Appt within 3-5 Days Complete Complete  HRI or Home Care Consult Complete Complete  Social Work Consult for [place] Planning/Counseling Complete Complete  Palliative Care Screening Not Applicable Not Applicable  Medication Review Press photographer) Complete Complete  Some recent data might be hidden

## 2021-07-03 NOTE — Progress Notes (Signed)
Patient voided 250 ml at 1545 and 50 ml at 1758, post void bladder scan at 1800 = 77ml.  No complaints offered.  Needs addressed.

## 2021-07-03 NOTE — Discharge Instructions (Signed)
  Diet: Resume home heart healthy regular diet.   Activity: No heavy lifting >20 pounds (children, pets, laundry, garbage) or strenuous activity until follow-up, but light activity and walking are encouraged. Do not drive or drink alcohol if taking narcotic pain medications.  Wound care: May shower with soapy water and pat dry (do not rub incisions), but no baths or submerging incision underwater until follow-up. (no swimming)   Call office [phone]) at any time if any questions, worsening pain, fevers/chills, bleeding, drainage from incision site, or other concerns.

## 2021-07-03 NOTE — TOC Progression Note (Signed)
Transition of Care [facility]) - Progression Note    Patient Details  Name: Trevor Ferguson MRN: [id number] Date of Birth: [birth year]  Transition of Care [facility]) CM/SW Contact  Beverly Sessions, RN Phone Number: 07/03/2021, 2:21 PM  Clinical Narrative:    Insurance is requiring a peer to peer Has to be completed tomorrow by 10 am [phone] Option 5.  M[place] notified  Repeat covid test ordered EMS packet and DNR on chart     Expected Discharge Plan: [place] Barriers to Discharge: Continued Medical Work up  Expected Discharge Plan and Services Expected Discharge Plan: [place]       Living arrangements for the past 2 months: [place]                                       Social Determinants of Health (SDOH) Interventions    Readmission Risk Interventions Readmission Risk Prevention Plan 06/23/2021 01/29/2021  Transportation Screening Complete Complete  PCP or Specialist Appt within 3-5 Days Complete Complete  HRI or [place] Complete Complete  Social Work Consult for [place] Planning/Counseling Complete Complete  Palliative Care Screening Not Applicable Not Applicable  Medication Review Press photographer) Complete Complete  Some recent data might be hidden

## 2021-07-04 DIAGNOSIS — N179 Acute kidney failure, unspecified: Secondary | ICD-10-CM | POA: Diagnosis not present

## 2021-07-04 DIAGNOSIS — K5780 Diverticulitis of intestine, part unspecified, with perforation and abscess without bleeding: Secondary | ICD-10-CM | POA: Diagnosis not present

## 2021-07-04 DIAGNOSIS — J9621 Acute and chronic respiratory failure with hypoxia: Secondary | ICD-10-CM | POA: Diagnosis not present

## 2021-07-04 LAB — IRON AND TIBC
Iron: 72 ug/dL (ref 45–182)
Saturation Ratios: 25 % (ref 17.9–39.5)
TIBC: 293 ug/dL (ref 250–450)
UIBC: 221 ug/dL

## 2021-07-04 LAB — FERRITIN: Ferritin: 201 ng/mL (ref 24–336)

## 2021-07-04 NOTE — Progress Notes (Signed)
[facility]  PROGRESS NOTE   Subjective:   Daughter at bedside.   UOP 914mL.  No new creatinine today.   Objective:  Vital signs in last 24 hours:  Temp:  [97.6 F (36.4 C)-99.1 F (37.3 C)] 97.6 F (36.4 C) (11/16 0809) Pulse Rate:  [80-99] 81 (11/16 0809) Resp:  [16] 16 (11/15 2002) BP: (154-164)/(68-70) 154/68 (11/16 0809) SpO2:  [93 %-97 %] 97 % (11/16 0809)  Weight change:  Filed Weights   06/22/21 2134 06/26/21 1549 06/27/21 0458  Weight: 96.2 kg 103.6 kg 104.4 kg    Intake/Output: I/O last 3 completed shifts: In: 1280 [P.O.:1180; IV Piggyback:100] Out: 1460 [Urine:1460]   Intake/Output this shift:  Total I/O In: 480 [P.O.:480] Out: -   Physical Exam: General:  No acute distress, laying in bed  Head:  Normocephalic, atraumatic. Moist oral mucosal membranes  Eyes:  Anicteric  Neck:  Supple  Lungs:   Clear to auscultation, normal effort  Heart:  regular  Abdomen:   +midline incision with staples, clean and dry  Extremities:  No peripheral edema.  Neurologic:  Awake, alert, following commands  Skin:  No lesions       Basic Metabolic Panel: Recent Labs  Lab 06/28/21 0323 06/29/21 0426 06/30/21 0445 07/01/21 0554 07/03/21 0827  NA 133* 136 136 137 140  K 3.7 3.2* 3.8 3.9 4.6  CL 105 105 103 102 104  CO2 21* 26 28 29 29   GLUCOSE 135* 136* 118* 119* 116*  BUN 50* 43* 41* 42* 43*  CREATININE 2.98* 2.43* 2.22* 1.98* 1.89*  CALCIUM 8.0* 8.1* 8.1* 8.3* 8.7*     CBC: Recent Labs  Lab 06/28/21 0323 07/03/21 0827  WBC 17.6* 14.2*  HGB 9.5* 9.0*  HCT 28.5* 26.5*  MCV 90.8 93.3  PLT 243 214      Urinalysis: No results for input(s): COLORURINE, LABSPEC, PHURINE, GLUCOSEU, HGBUR, BILIRUBINUR, KETONESUR, PROTEINUR, UROBILINOGEN, NITRITE, LEUKOCYTESUR in the last 72 hours.  Invalid input(s): APPERANCEUR    Imaging: No results found.   Medications:    sodium chloride 10 mL/hr at 06/26/21 1905    acidophilus  2 capsule  Oral TID   amLODipine  10 mg Oral Daily   benzonatate  200 mg Oral TID   budesonide (PULMICORT) nebulizer solution  0.5 mg Nebulization Daily   Chlorhexidine Gluconate Cloth  6 each Topical Daily   cholecalciferol  1,000 Units Oral Daily   escitalopram  10 mg Oral Daily   famotidine  10 mg Oral BID   feeding supplement  237 mL Oral TID BM   ferrous sulfate  325 mg Oral Q breakfast   heparin injection (subcutaneous)  5,000 Units Subcutaneous Q8H   losartan  100 mg Oral Daily   mouth rinse  15 mL Mouth Rinse BID   multivitamin with minerals  1 tablet Oral Daily   traZODone  150 mg Oral QHS    Assessment/ Plan:     Active Problems:   Acute diverticulitis   AKI (acute kidney injury) ([place])   Diverticulitis   Hypoxia   Perforation of intestine due to diverticulitis of gastrointestinal tract   HAP (hospital-acquired pneumonia)   Abdominal distension (gaseous)   Pressure injury of skin   Dyspnea  Mr.Trevor Ferguson is a 87 y.o. white male with hypertension, coronary artery disease, hyperlipidemia, COPD, obstructive sleep apnea, who is admitted to [facility] on 06/21/2021 for Diverticulitis [K57.92] AKI (acute kidney injury) ([place]) [N17.9] Acute diverticulitis [K57.92] Perforation of intestine due  to diverticulitis of gastrointestinal tract [K57.80]  Patient underwent Exlap with bowel resection on 06/22/2021.   Acute kidney injury on chronic kidney disease stage IIIB with proteinuria: baseline creatinine of 1.8, GFR of 35 on 03/28/21. Followed outpatient by me, Dr. Juleen China. Acute kidney injury secondary to prerenal azotemia and ATN. Nonoliguric urine output. Creatinine close to baseline. No indication for dialysis.  - needs hospital follow up  Anemia of chronic kidney disease: hemoglobin 9, normocytic, trending down. No indication for ESA. History of iron deficiency. Stool to be checked as per Hospitalist.  - Continue ferrous sulfate.   Hypertension with chronic kidney disease: 154/78.  Currently on amlodipine. Holding home medications of torsemide and losartan. - restarted losartan today  Secondary Hyperparathyroidism: not currently on any vitamin D agents.    LOS: [place], [facility] 11/16/202211:25 AM

## 2021-07-04 NOTE — Progress Notes (Signed)
Physical Therapy Treatment Patient Details Name: Trevor Ferguson MRN: [id number] DOB: 04-26-1926 Today's Date: 07/04/2021   History of Present Illness Patient is a 87 year old male with PMH of COPD, prostate cancer, HTN, sleep apnea, stage IIIB CKD and CVA. He presented to ER with acute onset of R lower quadrant abdominal pain with nausea and dry heaves.  Patient underwent exploratory laparotomy with small bowl resection on 06/22/21. Patient fell on 06/23/21 while in hospital. Patient came from [facility] SNF    PT Comments    Pt received supine in bed with family member present. Pt and daughter given education on home exercise program and pt able to demonstrate all exercises. Pt demonstrating improvements with bed mobility and ability to transfer with usage of RW. Pt continues to be limited by oxygen desaturation in standing and sitting requiring verbal cueing to breath through is nose to receive oxygen. In standing, pt desaturated to 85% on 4L oxygen and demonstrating increased shortness of breath during exertional activities. Pt left seated in recliner chair on 4L O2 with saturation at 91%. Will continue to benefit from PT to progress ambulation and return to prior level of function as able.    Recommendations for follow up therapy are one component of a multi-disciplinary discharge planning process, led by the attending physician.  Recommendations may be updated based on patient status, additional functional criteria and insurance authorization.  Follow Up Recommendations  Skilled nursing-short term rehab (<3 hours/day)     Assistance Recommended at Discharge Intermittent Supervision/Assistance  Equipment Recommendations  None recommended by PT    Recommendations for Other Services       Precautions / Restrictions Precautions Precautions: Fall Restrictions Weight Bearing Restrictions: No     Mobility  Bed Mobility Overal bed mobility: Modified Independent Bed Mobility: Supine to  Sit           General bed mobility comments: Mod I with usage of bed rails    Transfers Overall transfer level: Needs assistance Equipment used: Rolling Bomberger (2 wheels) Transfers: Sit to/from Stand;Bed to chair/wheelchair/BSC Sit to Stand: Min assist;From elevated surface     Step pivot transfers: Min guard     General transfer comment: MinA for sit to stand from slighlty elevated surface; CGA for step transfers to chair with usage of RW    Ambulation/Gait               General Gait Details: Deferred due to oxygen desaturation   Stairs             Wheelchair Mobility    Modified Rankin (Stroke Patients Only)       Balance Overall balance assessment: Needs assistance Sitting-balance support: No upper extremity supported;Feet supported Sitting balance-Leahy Scale: Good     Standing balance support: Bilateral upper extremity supported;During functional activity;Reliant on assistive device for balance Standing balance-Leahy Scale: Fair Standing balance comment: Improved standing balance with RW with B UE assist. CGA for safety. SpO2 decreased to 85% in standing                            Cognition Arousal/Alertness: Awake/alert Behavior During Therapy: WFL for tasks assessed/performed Overall Cognitive Status: Within Functional Limits for tasks assessed                                 General Comments: Imporved affect and alertness  Exercises General Exercises - Lower Extremity Ankle Circles/Pumps: AROM;Strengthening;Both;20 reps;Supine Quad Sets: AROM;Strengthening;Both;10 reps;Supine Gluteal Sets: AROM;Strengthening;Both;10 reps;Supine Heel Slides: AROM;Strengthening;Both;20 reps;Supine Hip ABduction/ADduction: AROM;Strengthening;Both;10 reps;Supine Other Exercises Other Exercises: Pt education given regarding home exercise program. Daughter present and no further questions regarding exercises    General  Comments General comments (skin integrity, edema, etc.): Pt SpO2 desaturated to 85% in standing and requiring seated rest breaks and cues for breathing through his nose. Pt was on 4L throughout the session. Pt left seated in recliner chair at 90% at end of session.      Pertinent Vitals/Pain Pain Assessment: No/denies pain    Home Living                          Prior Function            PT Goals (current goals can now be found in the care plan section) Acute Rehab PT Goals Patient Stated Goal: to return back to SNF PT Goal Formulation: With patient Time For Goal Achievement: 07/08/21 Potential to Achieve Goals: Fair Progress towards PT goals: Progressing toward goals    Frequency    Min 2X/week      PT Plan Current plan remains appropriate    Co-evaluation              AM-PAC PT "6 Clicks" Mobility   Outcome Measure  Help needed turning from your back to your side while in a flat bed without using bedrails?: None Help needed moving from lying on your back to sitting on the side of a flat bed without using bedrails?: None Help needed moving to and from a bed to a chair (including a wheelchair)?: A Little Help needed standing up from a chair using your arms (e.g., wheelchair or bedside chair)?: A Lot Help needed to walk in hospital room?: A Lot Help needed climbing 3-5 steps with a railing? : Total 6 Click Score: 16    End of Session Equipment Utilized During Treatment: Gait belt;Oxygen Activity Tolerance: Patient tolerated treatment well;Patient limited by fatigue Patient left: in chair;with call bell/phone within reach;with chair alarm set;with family/visitor present Nurse Communication: Mobility status PT Visit Diagnosis: Unsteadiness on feet (R26.81);Other abnormalities of gait and mobility (R26.89);Repeated falls (R29.6);Muscle weakness (generalized) (M62.81);Difficulty in walking, not elsewhere classified (R26.2);History of falling (Z91.81);Pain      Time: 6606-0045 PT Time Calculation (min) (ACUTE ONLY): 24 min  Charges:  $Gait Training: 8-22 mins $Therapeutic Exercise: 8-22 mins                     Andrey Campanile, SPT  Andrey Campanile 07/04/2021, 2:48 PM

## 2021-07-04 NOTE — Progress Notes (Signed)
Patient ID: Trevor Ferguson, male   DOB: 01-Aug-1926, 87 y.o.   MRN: [id number]     [place] Hospital Day(s): 13.   Interval History: Patient seen and examined, no acute events or new complaints overnight. Patient reports feeling well.  Denies abdominal pain.  Endorses tolerating diet  Vital signs in last 24 hours: [min-max] current  Temp:  [97.6 F (36.4 C)-99.1 F (37.3 C)] 97.6 F (36.4 C) (11/16 0809) Pulse Rate:  [80-99] 81 (11/16 0809) Resp:  [16] 16 (11/15 2002) BP: (154-164)/(68-70) 154/68 (11/16 0809) SpO2:  [93 %-97 %] 97 % (11/16 0809)     Height: 5' 8.5" (174 cm) Weight: 104.4 kg BMI (Calculated): 34.48   Physical Exam:  Constitutional: alert, cooperative and no distress  Respiratory: breathing non-labored at rest  Cardiovascular: regular rate and sinus rhythm  Gastrointestinal: soft, non-tender, and non-distended  Labs:  CBC Latest Ref Rng & Units 07/03/2021 06/28/2021 06/27/2021  WBC 4.0 - 10.5 K/uL 14.2(H) 17.6(H) 12.1(H)  Hemoglobin 13.0 - 17.0 g/dL 9.0(L) 9.5(L) 8.8(L)  Hematocrit 39.0 - 52.0 % 26.5(L) 28.5(L) 26.7(L)  Platelets 150 - 400 K/uL 214 243 213   CMP Latest Ref Rng & Units 07/03/2021 07/01/2021 06/30/2021  Glucose 70 - 99 mg/dL 116(H) 119(H) 118(H)  BUN 8 - 23 mg/dL 43(H) 42(H) 41(H)  Creatinine 0.61 - 1.24 mg/dL 1.89(H) 1.98(H) 2.22(H)  Sodium 135 - 145 mmol/L 140 137 136  Potassium 3.5 - 5.1 mmol/L 4.6 3.9 3.8  Chloride 98 - 111 mmol/L 104 102 103  CO2 22 - 32 mmol/L 29 29 28   Calcium 8.9 - 10.3 mg/dL 8.7(L) 8.3(L) 8.1(L)  Total Protein 6.5 - 8.1 g/dL - - -  Total Bilirubin 0.3 - 1.2 mg/dL - - -  Alkaline Phos 38 - 126 U/L - - -  AST 15 - 41 U/L - - -  ALT 0 - 44 U/L - - -    Imaging studies: No new pertinent imaging studies   Assessment/Plan:  87 y.o. male with diverticulitis of small intestine with perforation 13 Day Post-Op s/p small bowel resection and anastomosis, complicated by pertinent comorbidities including COPD,  hypertension, stage IIIb chronic kidney disease, history of CVA, sleep apnea.  Patient continue doing well from surgical standpoint.  Tolerating diet.  No pain.  Having some physical therapy.  Having bowel movement.  I will follow up patient in my office in a week for staple removals.  No contraindication to discharge from surgical standpoint when medically stable.  Arnold Long, MD

## 2021-07-04 NOTE — TOC Progression Note (Signed)
Transition of Care [facility]) - Progression Note    Patient Details  Name: Trevor Ferguson MRN: [id number] Date of Birth: 06/14/1926  [facility]) CM/SW Contact  Beverly Sessions, RN Phone Number: 07/04/2021, 9:39 AM  Clinical Narrative:    Received return call from daughter.  She states that they will not be appealing insurances decision to deny SNF   Expected Discharge Plan: Long Term Nursing Home Barriers to Discharge: Continued Medical Work up  Expected Discharge Plan and Services Expected Discharge Plan: [place]       Living arrangements for the past 2 months: [place]                                       Social Determinants of Health (SDOH) Interventions    Readmission Risk Interventions Readmission Risk Prevention Plan 06/23/2021 01/29/2021  Transportation Screening Complete Complete  PCP or Specialist Appt within 3-5 Days Complete Complete  HRI or [place] Complete Complete  Social Work Consult for [place] Planning/Counseling Complete Complete  Palliative Care Screening Not Applicable Not Applicable  Medication Review Press photographer) Complete Complete  Some recent data might be hidden

## 2021-07-04 NOTE — Progress Notes (Signed)
PROGRESS NOTE   As per Dr. Ouida Sills: Trevor Ferguson is a 87 y.o. male with a PMH significant for COPD, HTN, HLD, sleep apnea, CKD 3B, CVA. They presented from SNF to the ED on 06/21/2021 with abdominal pain x a few days. In the ED, it was found that they had small bowel diverticulitis with possible microperforation.  General surgery was consulted. 11/4 patient underwent exploratory laparotomy with small bowel resection 11/6 postoperative ileus 11/8 having bowel movements, 3 L O2 requirement, increased lethargy 11/10 O2 requirement 9-7D, metabolic acidosis improving with bicarb gtt, unable to tolerate PO 11/12 mental status improved. Metabolic acidosis resolved. Able to dc bicarb drip   07/03/21 -stable, improved    As per Dr. Jimmye Norman 07/04/21: Pt was on 4.5L [place] this morning and normally at home, pt uses 2-2.5L [place]. Will need to wean supplemental oxygen back to baseline or close to baseline prior to d/c. SNF was denied by pt's insurance and pt's daughter did not want to file an appeal.    [name]  ZHG:[id number] DOB: 07/27/1926 DOA: 06/21/2021 PCP: [name], MD  Assessment & Plan:   Active Problems:   Acute diverticulitis   AKI (acute kidney injury) ([place])   Diverticulitis   Hypoxia   Perforation of intestine due to diverticulitis of gastrointestinal tract   HAP (hospital-acquired pneumonia)   Abdominal distension (gaseous)   Pressure injury of skin   Dyspnea   Acute small bowel diverticulitis: w/ perforation s/p resection 19/6 complicated by postoperative ileus.  Now having diarrhea, suspect related to abx use. Tolerating soft diet. Completed abx course.Continue soft diet until OP surgery follow up   Acute on chronic hypoxic respiratory failure: likely secondary early pneumonia & COPD. Radiologist read recommends f/u chest imaging in 3-4 weeks to exclude underlying malignancy. Family was made aware. Continue on supplemental oxygen and wean back to baseline as  tolerating, currently on 3L [place] and this morning was on 4.5 L [place]. Completed abx course    Encephalopathy: resolved. Palliative care following and pt will continue current course of treatment and dc with hospice/palliative to facility  AKI on CKD IIIb: Cr is trending down from day prior. Avoid nephrotoxic meds    HTN: continue on amlodipine. Continue to hold ACE-I secondary to AKI on CKD    Depression: severity unknown. Continue on home dose of lexapro    GERD: continue on PPI   IDA: continue on home dose of iron supplement   DVT prophylaxis: SCDs Code Status: DNR  Family Communication: dicussed pt's care w/ pt's daughter, Trevor Ferguson, and answered her questions  Disposition Plan: likely d/c home w/ HH.  Level of care: Med-Surg  Status is: Inpatient  Remains inpatient appropriate because: if supplemental oxygen is stable at 3L or better  at rest and w/ exertion, pt can d/c back home to [facility].     Consultants:  General surg   Procedures:   Antimicrobials:    Subjective: Pt c/o fatigue   Objective: Vitals:   07/03/21 1605 07/03/21 2002 07/04/21 0539 07/04/21 0755  BP: (!) 164/70 (!) 163/68 (!) 156/68   Ferguson: 99 90 80   Resp:  16    Temp: 98 F (36.7 C) 99.1 F (37.3 C) 97.7 F (36.5 C)   TempSrc: Oral Oral Oral   SpO2: 93% 96% 97% 95%  Weight:      Height:        Intake/Output Summary (Last 24 hours) at 07/04/2021 0805 Last data filed at 07/04/2021  0500 Gross per 24 hour  Intake 1040 ml  Output 960 ml  Net 80 ml   Filed Weights   06/22/21 2134 06/26/21 1549 06/27/21 0458  Weight: 96.2 kg 103.6 kg 104.4 kg    Examination:  General exam: Appears calm and comfortable  Respiratory system: diminished breath sounds b/l  Cardiovascular system: S1 & S2+. No rubs, gallops or clicks.  Gastrointestinal system: Abdomen is nondistended, soft and nontender. Normal bowel sounds heard. Central nervous system: Alert and oriented. Moves all extremities   Psychiatry: Judgement and insight appear normal. Flat mood and affect    Data Reviewed: I have personally reviewed following labs and imaging studies  CBC: Recent Labs  Lab 06/28/21 0323 07/03/21 0827  WBC 17.6* 14.2*  HGB 9.5* 9.0*  HCT 28.5* 26.5*  MCV 90.8 93.3  PLT 243 413   Basic Metabolic Panel: Recent Labs  Lab 06/28/21 0323 06/29/21 0426 06/30/21 0445 07/01/21 0554 07/03/21 0827  NA 133* 136 136 137 140  K 3.7 3.2* 3.8 3.9 4.6  CL 105 105 103 102 104  CO2 21* 26 28 29 29   GLUCOSE 135* 136* 118* 119* 116*  BUN 50* 43* 41* 42* 43*  CREATININE 2.98* 2.43* 2.22* 1.98* 1.89*  CALCIUM 8.0* 8.1* 8.1* 8.3* 8.7*   GFR: Estimated Creatinine Clearance: 28.8 mL/min (A) (by C-G formula based on SCr of 1.89 mg/dL (H)). Liver Function Tests: No results for input(s): AST, ALT, ALKPHOS, BILITOT, PROT, ALBUMIN in the last 168 hours. No results for input(s): LIPASE, AMYLASE in the last 168 hours. No results for input(s): AMMONIA in the last 168 hours. Coagulation Profile: No results for input(s): INR, PROTIME in the last 168 hours. Cardiac Enzymes: No results for input(s): CKTOTAL, CKMB, CKMBINDEX, TROPONINI in the last 168 hours. BNP (last 3 results) No results for input(s): PROBNP in the last 8760 hours. HbA1C: No results for input(s): HGBA1C in the last 72 hours. CBG: Recent Labs  Lab 06/30/21 0822 07/01/21 0810  GLUCAP 123* 124*   Lipid Profile: No results for input(s): CHOL, HDL, LDLCALC, TRIG, CHOLHDL, LDLDIRECT in the last 72 hours. Thyroid Function Tests: No results for input(s): TSH, T4TOTAL, FREET4, T3FREE, THYROIDAB in the last 72 hours. Anemia Panel: Recent Labs    07/04/21 0620  FERRITIN 201  TIBC 293  IRON 72   Sepsis Labs: No results for input(s): PROCALCITON, LATICACIDVEN in the last 168 hours.  Recent Results (from the past 240 hour(s))  CULTURE, BLOOD (ROUTINE X 2) w Reflex to ID Panel     Status: None   Collection Time: 06/26/21  8:11  PM   Specimen: BLOOD  Result Value Ref Range Status   Specimen Description BLOOD RIGHT ANTECUBITAL  Final   Special Requests   Final    BOTTLES DRAWN AEROBIC AND ANAEROBIC Blood Culture adequate volume   Culture   Final    NO GROWTH 5 DAYS Performed at [facility], [address] [place], [place] [address]    Report Status 07/01/2021 FINAL  Final  CULTURE, BLOOD (ROUTINE X 2) w Reflex to ID Panel     Status: None   Collection Time: 06/26/21  8:30 PM   Specimen: BLOOD  Result Value Ref Range Status   Specimen Description BLOOD BLOOD RIGHT FOREARM  Final   Special Requests   Final    BOTTLES DRAWN AEROBIC AND ANAEROBIC Blood Culture results may not be optimal due to an excessive volume of blood received in culture bottles   Culture  Final    NO GROWTH 5 DAYS Performed at [facility], [address] [place], [place] [address]    Report Status 07/01/2021 FINAL  Final  Resp Panel by RT-PCR (Flu A&B, Covid) Nasopharyngeal Swab     Status: None   Collection Time: 07/03/21 12:29 PM   Specimen: Nasopharyngeal Swab; Nasopharyngeal(NP) swabs in vial transport medium  Result Value Ref Range Status   SARS Coronavirus 2 by RT PCR NEGATIVE NEGATIVE Final    Comment: (NOTE) SARS-CoV-2 target nucleic acids are NOT DETECTED.  The SARS-CoV-2 RNA is generally detectable in upper respiratory specimens during the acute phase of infection. The lowest concentration of SARS-CoV-2 viral copies this assay can detect is 138 copies/mL. A negative result does not preclude SARS-Cov-2 infection and should not be used as the sole basis for treatment or other patient management decisions. A negative result may occur with  improper specimen collection/handling, submission of specimen other than nasopharyngeal swab, presence of viral mutation(s) within the areas targeted by this assay, and inadequate number of viral copies(<138 copies/mL). A negative result must be combined  with clinical observations, patient history, and epidemiological information. The expected result is Negative.  Fact Sheet for Patients:  EntrepreneurPulse.com.au  Fact Sheet for Healthcare Providers:  IncredibleEmployment.be  This test is no t yet approved or cleared by the [place] FDA and  has been authorized for detection and/or diagnosis of SARS-CoV-2 by FDA under an Emergency Use Authorization (EUA). This EUA will remain  in effect (meaning this test can be used) for the duration of the COVID-19 declaration under Section 564(b)(1) of the Act, 21 U.S.C.section 360bbb-3(b)(1), unless the authorization is terminated  or revoked sooner.       Influenza A by PCR NEGATIVE NEGATIVE Final   Influenza B by PCR NEGATIVE NEGATIVE Final    Comment: (NOTE) The Xpert Xpress SARS-CoV-2/FLU/RSV plus assay is intended as an aid in the diagnosis of influenza from Nasopharyngeal swab specimens and should not be used as a sole basis for treatment. Nasal washings and aspirates are unacceptable for Xpert Xpress SARS-CoV-2/FLU/RSV testing.  Fact Sheet for Patients: EntrepreneurPulse.com.au  Fact Sheet for Healthcare Providers: IncredibleEmployment.be  This test is not yet approved or cleared by the [place] FDA and has been authorized for detection and/or diagnosis of SARS-CoV-2 by FDA under an Emergency Use Authorization (EUA). This EUA will remain in effect (meaning this test can be used) for the duration of the COVID-19 declaration under Section 564(b)(1) of the Act, 21 U.S.C. section 360bbb-3(b)(1), unless the authorization is terminated or revoked.  Performed at [facility], [address], [place], [place] [address]          Radiology Studies: No results found.      Scheduled Meds:  acidophilus  2 capsule Oral TID   amLODipine  10 mg Oral Daily   benzonatate  200 mg Oral TID    budesonide (PULMICORT) nebulizer solution  0.5 mg Nebulization Daily   Chlorhexidine Gluconate Cloth  6 each Topical Daily   cholecalciferol  1,000 Units Oral Daily   escitalopram  10 mg Oral Daily   famotidine  10 mg Oral BID   feeding supplement  237 mL Oral TID BM   ferrous sulfate  325 mg Oral Q breakfast   heparin injection (subcutaneous)  5,000 Units Subcutaneous Q8H   losartan  100 mg Oral Daily   mouth rinse  15 mL Mouth Rinse BID   multivitamin with minerals  1 tablet Oral Daily  traZODone  150 mg Oral QHS   Continuous Infusions:  sodium chloride 10 mL/hr at 06/26/21 1905     LOS: 13 days    Time spent: 32 mins     Wyvonnia Dusky, MD [facility] Pager [phone]  If 7PM-7AM, please contact night-coverage 07/04/2021, 8:05 AM

## 2021-07-05 ENCOUNTER — Inpatient Hospital Stay: Payer: Medicare PPO

## 2021-07-05 DIAGNOSIS — R0600 Dyspnea, unspecified: Secondary | ICD-10-CM

## 2021-07-05 DIAGNOSIS — J189 Pneumonia, unspecified organism: Secondary | ICD-10-CM | POA: Diagnosis not present

## 2021-07-05 DIAGNOSIS — K5780 Diverticulitis of intestine, part unspecified, with perforation and abscess without bleeding: Secondary | ICD-10-CM | POA: Diagnosis not present

## 2021-07-05 DIAGNOSIS — K5792 Diverticulitis of intestine, part unspecified, without perforation or abscess without bleeding: Secondary | ICD-10-CM | POA: Diagnosis not present

## 2021-07-05 DIAGNOSIS — Y95 Nosocomial condition: Secondary | ICD-10-CM | POA: Diagnosis not present

## 2021-07-05 LAB — GLUCOSE, CAPILLARY: Glucose-Capillary: 190 mg/dL — ABNORMAL HIGH (ref 70–99)

## 2021-07-05 LAB — BASIC METABOLIC PANEL
Anion gap: 13 (ref 5–15)
BUN: 40 mg/dL — ABNORMAL HIGH (ref 8–23)
CO2: 28 mmol/L (ref 22–32)
Calcium: 8.6 mg/dL — ABNORMAL LOW (ref 8.9–10.3)
Chloride: 101 mmol/L (ref 98–111)
Creatinine, Ser: 1.5 mg/dL — ABNORMAL HIGH (ref 0.61–1.24)
GFR, Estimated: 43 mL/min — ABNORMAL LOW (ref >60)
Glucose, Bld: 123 mg/dL — ABNORMAL HIGH (ref 70–99)
Potassium: 4.2 mmol/L (ref 3.5–5.1)
Sodium: 142 mmol/L (ref 135–145)

## 2021-07-05 LAB — CBC
HCT: 25.9 % — ABNORMAL LOW (ref 39.0–52.0)
Hemoglobin: 8.4 g/dL — ABNORMAL LOW (ref 13.0–17.0)
MCH: 31.2 pg (ref 26.0–34.0)
MCHC: 32.4 g/dL (ref 30.0–36.0)
MCV: 96.3 fL (ref 80.0–100.0)
Platelets: 205 10*3/uL (ref 150–400)
RBC: 2.69 MIL/uL — ABNORMAL LOW (ref 4.22–5.81)
RDW: 15.3 % (ref 11.5–15.5)
WBC: 14 10*3/uL — ABNORMAL HIGH (ref 4.0–10.5)
nRBC: 0 % (ref 0.0–0.2)

## 2021-07-05 LAB — BRAIN NATRIURETIC PEPTIDE: B Natriuretic Peptide: 823.9 pg/mL — ABNORMAL HIGH (ref 0.0–100.0)

## 2021-07-05 MED ORDER — OXYCODONE-ACETAMINOPHEN 5-325 MG PO TABS
1.0000 | ORAL_TABLET | Freq: Four times a day (QID) | ORAL | 0 refills | Status: DC | PRN
Start: 2021-07-05 — End: 2022-07-22

## 2021-07-05 MED ORDER — LOSARTAN POTASSIUM 100 MG PO TABS: 100.0000 mg | ORAL_TABLET | Freq: Every day | ORAL | Status: DC

## 2021-07-05 MED ORDER — METHYLPREDNISOLONE SODIUM SUCC 125 MG IJ SOLR
90.0000 mg | Freq: Two times a day (BID) | INTRAMUSCULAR | Status: DC
  Administered 2021-07-05 – 2021-07-07 (×4): 90 mg via INTRAVENOUS
  Filled 2021-07-05 (×4): qty 2

## 2021-07-05 MED ORDER — FUROSEMIDE 10 MG/ML IJ SOLN
40.0000 mg | Freq: Once | INTRAMUSCULAR | Status: AC
  Administered 2021-07-05: 19:00:00 40 mg via INTRAVENOUS
  Filled 2021-07-05: qty 4

## 2021-07-05 MED ORDER — FERROUS SULFATE 325 (65 FE) MG PO TABS: 325.0000 mg | ORAL_TABLET | Freq: Every day | ORAL | 3 refills | Status: DC

## 2021-07-05 MED ORDER — SALINE SPRAY 0.65 % NA SOLN
1.0000 | NASAL | Status: DC | PRN
  Administered 2021-07-06 – 2021-07-17 (×2): 1 via NASAL
  Filled 2021-07-05: qty 44

## 2021-07-05 MED ORDER — IPRATROPIUM-ALBUTEROL 0.5-2.5 (3) MG/3ML IN SOLN
3.0000 mL | Freq: Four times a day (QID) | RESPIRATORY_TRACT | Status: DC
  Administered 2021-07-05 – 2021-07-12 (×27): 3 mL via RESPIRATORY_TRACT
  Filled 2021-07-05 (×29): qty 3

## 2021-07-05 NOTE — Progress Notes (Signed)
Pt requiring increase in oxygen needs. Pt called earlier and stated "I cannot breath" on arrival to room pt's O2 saturations were in the low 80's. MD [place] made aware. O2 increased to 5L and Albuterol treatment was administered. MD came to see patient. Per MD, pt was having a COPD exacerbation and orders received for IV solumedrol and duo Nebs.  Minutes later, pt called a second time, on arrival to room, pt's O2 saturations were in the 70's on 5L of oxygen. MD notified to make him aware of the situation and to request for pt to be placed on bipap. Rapid response was called and pt was transferred to room 248. Daughter [name] made aware of the situation.

## 2021-07-05 NOTE — Significant Event (Signed)
Rapid Response Event Note   Reason for Call :  Increased work of breathing, increased RR  Initial Focused Assessment:  Rapid response RN arrived in patient's room with patient sitting up in bed with his nurse and RT at bedside. Patient was alert and oriented, appeared calm but with increased work of breathing and RR in upper 20s. HR appeared to be 110s ST. Oxygen saturations 98% on NRB. Patient able to speak in full sentences. Lungs diminshed. Per patient's RN, patient had episode of acute shortness of breath, had wheezing, increased work of breathing, and elevated respirations. Patient increased from 5L nasal cannula to NRB by RT. Patient's RN gave ordered albuterol breathing treatment before rapid response RN arrival which helped patient per patient's RN. Per patient's RN patient also had similar episode earlier today.  Interventions:  None by this RN. Patient's RN getting patient the IV steroids patient's doctor ordered. Doctor adjusted breathing treatment orders.  Plan of Care:  Patient to transfer to 2A for bipap trial.   Event Summary:   MD Notified: Dr. Darrick Meigs Call Time: 14:13 Arrival Time: 14:15 End Time: 14:21  Stephanie Acre, RN

## 2021-07-05 NOTE — Progress Notes (Signed)
   07/05/21 1420  Clinical Encounter Type  Visited With Patient  Visit Type Initial;Spiritual support;Social support  Referral From Other (Comment) (rapid response)  Spiritual Encounters  Spiritual Needs Emotional;Prayer  Chaplain Burris responded to an adult rapid response to room 208. Chaplain Burris engaged Pt who is experiencing SOB. 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 Trevor Ferguson urged him to keep O2 mask in place and to take slow deep breaths. Chaplain offered support and will continue to follow as Pt moved to 248. Chaplain offered silent prayer.

## 2021-07-05 NOTE — Care Management Important Message (Signed)
Important Message  Patient Details  Name: Trevor Ferguson MRN: [id number] Date of Birth: [birth year]   Medicare Important Message Given:  Yes     Dannette Barbara 07/05/2021, 10:31 AM

## 2021-07-05 NOTE — Discharge Summary (Signed)
Physician Discharge Summary  LISTON THUM YBO:[id number] DOB: [birth year] DOA: 06/21/2021  PCP: Viviana Simpler I, MD  Admit date: 06/21/2021 Discharge date: 07/05/2021  Time spent: 60 minutes  Recommendations for Outpatient Follow-up:  Follow-up general surgery in 1 week for suture removal Follow-up nephrology and 2 weeks Continue on soft diet till outpatient surgical follow-up Will need follow-up chest x-ray in 2 to 4 weeks to check resolution of pneumonia and exclude underlying malignancy   Discharge Diagnoses:  Active Problems:   Acute diverticulitis   AKI (acute kidney injury) ([place])   Diverticulitis   Hypoxia   Perforation of intestine due to diverticulitis of gastrointestinal tract   HAP (hospital-acquired pneumonia)   Abdominal distension (gaseous)   Pressure injury of skin   Dyspnea   Discharge Condition: Stable  Diet recommendation: Soft diet until outpatient surgery follow-up  Filed Weights   06/22/21 2134 06/26/21 1549 06/27/21 0458  Weight: 96.2 kg 103.6 kg 104.4 kg    History of present illness:  87 y.o. male with a PMH significant for COPD, HTN, HLD, sleep apnea, CKD 3B, CVA. They presented from SNF to the ED on 06/21/2021 with abdominal pain x a few days. In the ED, it was found that they had small bowel diverticulitis with possible microperforation.  General surgery was consulted. 11/4 patient underwent exploratory laparotomy with small bowel resection 11/6 postoperative ileus 11/8 having bowel movements, 3 L O2 requirement, increased lethargy 11/10 O2 requirement 2-7P, metabolic acidosis improving with bicarb gtt, unable to tolerate PO 11/12 mental status improved. Metabolic acidosis resolved. Able to dc bicarb drip   07/03/21 -stable, improved  Hospital Course:   Acute small bowel diverticulitis with perforation -S/p resection on 82/11/2351 complicated by postop ileus -Was having diarrhea, currently resolved. -Continue soft diet till outpatient  surgical follow-up -Follow-up surgery in 1 week for staple removal  Acute on chronic hypoxemic respiratory failure -Resolved, at baseline; continue oxygen 2 L/min -Likely due to combination of early pneumonia versus COPD -Cardiologist recommended to follow-up chest imaging in 2 to 4 weeks to exclude underlying malignancy -Family was made aware by hospitalist Dr. Eppie Gibson -Completed antibiotics in the hospital  Acute metabolic encephalopathy -Resolved -Mentally at baseline -Was seen by palliative care -Follow-up palliative care at facility  Acute kidney injury on CKD stage IIIb -Creatinine has improved to 1.50 -Near baseline -Continue losartan -Continue to hold HCTZ  Depression -Continue Lexapro  Iron-deficiency anemia -Continue p.o. ferrous sulfate   Procedures: Expiratory laparotomy with small bowel resection  Consultations: Nephrology  Discharge Exam: Vitals:   07/05/21 0758 07/05/21 0806  BP:  (!) 147/69  Pulse:  77  Resp:  16  Temp:  98.2 F (36.8 C)  SpO2: 90% 94%    General: Appears in no acute distress Cardiovascular: S1-S2, regular Respiratory: Clear to auscultation bilaterally  Discharge Instructions   Discharge Instructions     Diet - low sodium heart healthy   Complete by: As directed    Discharge instructions   Complete by: As directed    Follow up Arnold Long, MD in office in one week for staples removal   Increase activity slowly   Complete by: As directed    No wound care   Complete by: As directed       Allergies as of 07/05/2021       Reactions   Penicillins Hives, Rash, Other (See Comments)   Rash on hands and blisters on ankles when 87 years old Patient tolerates Zosyn   Bee Venom  Palpitations   INSECT BITES/STINGS   Darvon [propoxyphene] Nausea Only, Rash        Medication List     STOP taking these medications    acetaminophen 325 MG tablet Commonly known as: TYLENOL   azithromycin 250 MG  tablet Commonly known as: ZITHROMAX   famotidine 20 MG tablet Commonly known as: PEPCID   losartan-hydrochlorothiazide 100-25 MG tablet Commonly known as: HYZAAR   potassium chloride 10 MEQ tablet Commonly known as: KLOR-CON   torsemide 10 MG tablet Commonly known as: DEMADEX       TAKE these medications    acidophilus Caps capsule Take 2 capsules by mouth 3 (three) times daily.   albuterol 108 (90 Base) MCG/ACT inhaler Commonly known as: VENTOLIN HFA Inhale 1-2 puffs into the lungs every 6 (six) hours as needed for wheezing or shortness of breath. What changed: Another medication with the same name was removed. Continue taking this medication, and follow the directions you see here.   amLODipine 5 MG tablet Commonly known as: NORVASC Take 5 mg by mouth daily.   budesonide 0.5 MG/2ML nebulizer solution Commonly known as: PULMICORT Take 0.5 mg by nebulization daily.   escitalopram 10 MG tablet Commonly known as: LEXAPRO Take 10 mg by mouth daily.   ferrous sulfate 325 (65 FE) MG tablet Take 1 tablet (325 mg total) by mouth daily with breakfast. Start taking on: July 06, 2021 What changed:  medication strength how much to take when to take this   fluticasone 50 MCG/ACT nasal spray Commonly known as: FLONASE Place 2 sprays into both nostrils daily as needed for allergies or rhinitis.   loratadine 10 MG tablet Commonly known as: CLARITIN Take 10 mg by mouth daily.   losartan 100 MG tablet Commonly known as: COZAAR Take 1 tablet (100 mg total) by mouth daily. Start taking on: July 06, 2021   melatonin 5 MG Tabs Take 5 mg by mouth at bedtime.   omeprazole 20 MG capsule Commonly known as: PRILOSEC Take 20 mg by mouth daily.   ondansetron 4 MG tablet Commonly known as: ZOFRAN Take 4 mg by mouth every 8 (eight) hours as needed for nausea or vomiting.   oxyCODONE-acetaminophen 5-325 MG tablet Commonly known as: PERCOCET/ROXICET Take 1 tablet by  mouth every 6 (six) hours as needed for moderate pain.   Prevagen 10 MG Caps Generic drug: Apoaequorin Take 10 mg by mouth daily.   simethicone 125 MG chewable tablet Commonly known as: MYLICON Chew 778 mg by mouth every 6 (six) hours as needed for flatulence.   traZODone 150 MG tablet Commonly known as: DESYREL Take 150 mg by mouth at bedtime.   triamcinolone cream 0.1 % Commonly known as: KENALOG Apply 1 application topically 2 (two) times daily.   Vitamin D-3 25 MCG (1000 UT) Caps Take 1,000 Units by mouth daily.       Allergies  Allergen Reactions   Penicillins Hives, Rash and Other (See Comments)    Rash on hands and blisters on ankles when 87 years old Patient tolerates Zosyn   Bee Venom Palpitations    INSECT BITES/STINGS   Darvon [Propoxyphene] Nausea Only and Rash    Contact information for follow-up providers     Herbert Pun, MD Follow up in 1 week(s).   Specialty: General Surgery Why: For suture removal Contact information: [place] [address] [phone]         Lavonia Dana, MD. Schedule an appointment as soon as possible  for a visit in 2 week(s).   Specialty: Nephrology Contact information: [address] [place] [address] [phone]              Contact information for after-discharge care     Destination     [facility] .   Service: Skilled Nursing Contact information: [place] [phone]                      The results of significant diagnostics from this hospitalization (including imaging, microbiology, ancillary and laboratory) are listed below for reference.    Significant Diagnostic Studies: CT ABDOMEN PELVIS WO CONTRAST  Result Date: 06/26/2021 CLINICAL DATA:  Status post small bowel resection for small bowel diverticulitis. Worsening clinical condition. EXAM: CT ABDOMEN AND PELVIS  WITHOUT CONTRAST TECHNIQUE: Multidetector CT imaging of the abdomen and pelvis was performed following the standard protocol without IV contrast. COMPARISON:  06/21/2021 FINDINGS: Lower chest: Minimal dependent atelectasis or infiltrate in both lungs with tiny right effusion. Hepatobiliary: No focal abnormality in the liver on this study without intravenous contrast. Gallbladder is markedly distended. No intrahepatic or extrahepatic biliary dilation. Pancreas: No focal mass lesion. No dilatation of the main duct. No intraparenchymal cyst. No peripancreatic edema. Spleen: No splenomegaly. No focal mass lesion. Adrenals/Urinary Tract: No adrenal nodule or mass. Right kidney unremarkable. 2 cm low-density lesion interpolar left kidney is stable in the interval, likely a cyst. No evidence for hydroureter. The urinary bladder appears normal for the degree of distention. Stomach/Bowel: Stomach is unremarkable. No gastric wall thickening. No evidence of outlet obstruction. Duodenum is normally positioned as is the ligament of Treitz. Duodenal diverticulum noted. No small bowel wall thickening. No small bowel dilatation. Left abdominal anastomosis is unremarkable without evidence of wall thickening, pneumatosis, or adjacent fluid/extraluminal gas. No small bowel dilatation. The terminal ileum is normal. Right colon is mildly distended with gas and fluid. Left colon is nondilated. Diverticular change noted in the left colon without diverticulitis. Vascular/Lymphatic: There is moderate atherosclerotic calcification of the abdominal aorta without aneurysm. There is no gastrohepatic or hepatoduodenal ligament lymphadenopathy. No retroperitoneal or mesenteric lymphadenopathy. No pelvic sidewall lymphadenopathy. Reproductive: Brachytherapy seeds noted in the prostate gland. Other: No intraperitoneal free fluid.  No intraperitoneal free air. Musculoskeletal: Diffuse body wall edema. No worrisome lytic or sclerotic osseous  abnormality. IMPRESSION: 1. Left abdominal small bowel anastomosis is unremarkable. No evidence for wall thickening, pneumatosis, or adjacent fluid/extraluminal gas. No free fluid in the peritoneal cavity. No small bowel dilatation. 2. Marked gallbladder distension. 3. Right colon is mildly distended with gas and fluid. Imaging features are nonspecific. 4. Left colonic diverticulosis without diverticulitis. 5. Diffuse body wall edema. 6. Aortic Atherosclerosis (ICD10-I70.0). Electronically Signed   By: Misty Stanley M.D.   On: 06/26/2021 18:28   CT ABDOMEN PELVIS WO CONTRAST  Result Date: 06/21/2021 CLINICAL DATA:  87 year old with abdominal distension and right lower quadrant pain EXAM: CT ABDOMEN AND PELVIS WITHOUT CONTRAST TECHNIQUE: Multidetector CT imaging of the abdomen and pelvis was performed following the standard protocol without IV contrast. COMPARISON:  CT 03/10/2020 FINDINGS: Lower chest: Subpleural reticular opacity and ground-glass in the right greater than left lower lobe. Resolution of previous pleural effusions. Hepatobiliary: No focal hepatic abnormality on this unenhanced exam. Unremarkable gallbladder. No calcified gallstone. No biliary dilatation. Pancreas: Age related fatty atrophy No ductal dilatation or inflammation. Spleen: Normal in size without focal abnormality. Adrenals/Urinary Tract: Normal adrenal glands.  No hydronephrosis. Small upper pole right renal calculus. Small left renal cyst. Unremarkable urinary bladder. Stomach/Bowel: Decompressed stomach. Fluid-filled duodenal diverticulum without inflammation. There is no inflamed small bowel diverticula in the right abdomen, series 5, image 32 hand series 2, image 56. Adjacent fluid-filled small bowel and mesenteric edema. Small amount of mottled gas in the adjacent mesentery, series 2, image 54. This does not appear to be within the mesenteric vasculature. There is no small bowel pneumatosis. Regional small bowel are mildly  prominent fluid-filled suggesting regional ileus. Fluid within the cecum and ascending colon. There is formed stool in the more distal colon. Descending and sigmoid colonic diverticulosis without colonic diverticulitis. Vascular/Lymphatic: Aortic atherosclerosis. No aortic aneurysm. There is no portal venous gas. No abdominopelvic adenopathy. Reproductive: Brachytherapy seeds in the prostate. Other: Inflammatory changes with stranding and mesenteric edema in the right small bowel mesentery. Small amount of mottled extraluminal gas related small bowel diverticulitis. No abdominopelvic ascites. No focal or drainable collection. Fat containing bilateral inguinal hernias. Musculoskeletal: Thoracolumbar spine degenerative change. There are no acute or suspicious osseous abnormalities. IMPRESSION: 1. Recurrent acute small bowel diverticulitis in the right abdomen. Small amount of mottled extraluminal gas in the adjacent mesentery consistent with microperforation. No focal or drainable collection. 2. Regional small bowel ileus. 3. Colonic diverticulosis without diverticulitis. 4. Nonobstructing right nephrolithiasis. 5. Subpleural reticular opacity and ground-glass in the right greater than left lower lobe, favoring post infectious/inflammatory sequela. Aortic Atherosclerosis (ICD10-I70.0). Critical Value/emergent results were called by telephone at the time of interpretation on 06/21/2021 at 8:28 pm to provider [facility] , who verbally acknowledged these results. Electronically Signed   By: Keith Rake M.D.   On: 06/21/2021 20:29   CT HEAD WO CONTRAST (5MM)  Result Date: 06/26/2021 CLINICAL DATA:  Mental status change, unknown cause EXAM: CT HEAD WITHOUT CONTRAST TECHNIQUE: Contiguous axial images were obtained from the base of the skull through the vertex without intravenous contrast. COMPARISON:  06/23/2021 FINDINGS: Brain: No evidence of acute infarction, hemorrhage, cerebral edema, mass, mass effect, or  midline shift. Ventricles and sulci are within normal limits for age. No extra-axial fluid collection. Periventricular white matter changes, likely the sequela of chronic small vessel ischemic disease. Remote lacunar infarct in the left basal ganglia Vascular: No hyperdense vessel. Atherosclerotic calcifications in the intracranial carotid and vertebral arteries. Skull: Normal. Negative for fracture or focal lesion. Sinuses/Orbits: Mucosal thickening in the ethmoid air cells. Status post bilateral lens replacements. Other: The mastoid air cells are well aerated. IMPRESSION: No acute intracranial process. Electronically Signed   By: Merilyn Baba M.D.   On: 06/26/2021 18:15   CT HEAD WO CONTRAST (5MM)  Result Date: 06/23/2021 CLINICAL DATA:  Unwitnessed fall. EXAM: CT HEAD WITHOUT CONTRAST TECHNIQUE: Contiguous axial images were obtained from the base of the skull through the vertex without intravenous contrast. COMPARISON:  Head CT 03/07/2019 FINDINGS: Brain: Age related atrophy and chronic small vessel ischemia. No intracranial hemorrhage, mass effect, or midline shift. No hydrocephalus. The basilar cisterns are patent. No evidence of territorial infarct or acute ischemia. No extra-axial or intracranial fluid collection. Vascular: Atherosclerosis of skullbase vasculature without hyperdense vessel or abnormal calcification. Skull: No fracture or focal lesion. Sinuses/Orbits: Chronic opacification of right ethmoid air cells. Trace mucosal thickening in left side of sphenoid sinus. Bilateral cataract resection Other: No confluent scalp hematoma. IMPRESSION: 1. No acute intracranial abnormality. No skull fracture. 2. Age related atrophy and chronic small vessel ischemia. Electronically Signed   By: Aurther Loft.D.  On: 06/23/2021 22:35   US RENAL  Result Date: 06/25/2021 CLINICAL DATA:  Acute kidney insufficiency EXAM: RENAL / URINARY TRACT ULTRASOUND COMPLETE COMPARISON:  CT abdomen 06/21/2021 FINDINGS:  Right Kidney: Renal measurements: 10.7 x 5.1 x 5.5 cm = volume: 158 mL. Increased echogenicity with cortical thinning. 10 mm nonobstructing calculus in the upper pole. No hydronephrosis. Left Kidney: Renal measurements: 13.4 x 6 x 5.6 cm = volume: 236 mL. Increased echogenicity with cortical thinning in the upper and midpole. Anechoic cysts measuring up to 1.7 cm in the upper pole. No nephrolithiasis or hydronephrosis identified. Bladder: Appears normal for degree of bladder distention. Other: None. IMPRESSION: 1. Evidence of medical renal disease. 2. Right renal calculus. 3. Left renal cysts. Electronically Signed   By: Ofilia Neas M.D.   On: 06/25/2021 11:05   DG Chest Port 1 View  Result Date: 06/30/2021 CLINICAL DATA:  Dyspnea R06.00 (ICD-10-CM) EXAM: PORTABLE CHEST 1 VIEW COMPARISON:  June 26, 2021. FINDINGS: Slightly increased conspicuity of patchy airspace opacity in the right midlung. No visible pleural effusions or pneumothorax. Similar enlarged cardiac silhouette. Degenerative changes of the spine. IMPRESSION: 1. Slightly increased conspicuity of patchy airspace opacity in the right midlung, suspicious for pneumonia. Followup PA and lateral chest X-ray is recommended in 3-4 weeks following trial of antibiotic therapy to ensure resolution and exclude underlying malignancy. 2. Similar cardiomegaly. Electronically Signed   By: Margaretha Sheffield M.D.   On: 06/30/2021 11:43   DG Chest Port 1 View  Result Date: 06/26/2021 CLINICAL DATA:  Hypoxia. EXAM: PORTABLE CHEST 1 VIEW COMPARISON:  Prior chest radiographs 03/06/2020 and earlier. FINDINGS: Cardiomegaly, unchanged. Subtle ill-defined opacity within the right mid-lung. No appreciable airspace consolidation within the left lung. No evidence of pleural effusion or pneumothorax. No acute bony abnormality identified. Degenerative changes of the spine. IMPRESSION: Subtle ill-defined opacity within the right mid-lung, which may reflect scarring,  atelectasis or early pneumonia. Clinical correlation is recommended. Additionally, short-interval radiographic follow-up is recommended. Cardiomegaly, unchanged. Electronically Signed   By: Kellie Simmering D.O.   On: 06/26/2021 11:09   DG Abd 2 Views  Result Date: 06/24/2021 CLINICAL DATA:  Abdominal distension EXAM: ABDOMEN - 2 VIEW COMPARISON:  06/21/2021 FINDINGS: Scattered large and small bowel gas is noted. No significant dilatation is noted. Postsurgical changes are seen consistent with recent exploratory laparotomy. These findings are likely related to a postoperative ileus. No definitive free air is seen. No acute bony abnormality is noted. Prostate therapy seeds are seen. IMPRESSION: Changes consistent with mild postoperative ileus. Electronically Signed   By: Inez Catalina M.D.   On: 06/24/2021 21:26    Microbiology: Recent Results (from the past 240 hour(s))  CULTURE, BLOOD (ROUTINE X 2) w Reflex to ID Panel     Status: None   Collection Time: 06/26/21  8:11 PM   Specimen: BLOOD  Result Value Ref Range Status   Specimen Description BLOOD RIGHT ANTECUBITAL  Final   Special Requests   Final    BOTTLES DRAWN AEROBIC AND ANAEROBIC Blood Culture adequate volume   Culture   Final    NO GROWTH 5 DAYS Performed at [facility], [address] [place], [place] [address]    Report Status 07/01/2021 FINAL  Final  CULTURE, BLOOD (ROUTINE X 2) w Reflex to ID Panel     Status: None   Collection Time: 06/26/21  8:30 PM   Specimen: BLOOD  Result Value Ref Range Status   Specimen Description BLOOD BLOOD RIGHT FOREARM  Final   Special Requests   Final    BOTTLES DRAWN AEROBIC AND ANAEROBIC Blood Culture results may not be optimal due to an excessive volume of blood received in culture bottles   Culture   Final    NO GROWTH 5 DAYS Performed at [facility], [address] [place], [place] [address]    Report Status 07/01/2021 FINAL  Final  Resp Panel by RT-PCR (Flu A&B,  Covid) Nasopharyngeal Swab     Status: None   Collection Time: 07/03/21 12:29 PM   Specimen: Nasopharyngeal Swab; Nasopharyngeal(NP) swabs in vial transport medium  Result Value Ref Range Status   SARS Coronavirus 2 by RT PCR NEGATIVE NEGATIVE Final    Comment: (NOTE) SARS-CoV-2 target nucleic acids are NOT DETECTED.  The SARS-CoV-2 RNA is generally detectable in upper respiratory specimens during the acute phase of infection. The lowest concentration of SARS-CoV-2 viral copies this assay can detect is 138 copies/mL. A negative result does not preclude SARS-Cov-2 infection and should not be used as the sole basis for treatment or other patient management decisions. A negative result may occur with  improper specimen collection/handling, submission of specimen other than nasopharyngeal swab, presence of viral mutation(s) within the areas targeted by this assay, and inadequate number of viral copies(<138 copies/mL). A negative result must be combined with clinical observations, patient history, and epidemiological information. The expected result is Negative.  Fact Sheet for Patients:  EntrepreneurPulse.com.au  Fact Sheet for Healthcare Providers:  IncredibleEmployment.be  This test is no t yet approved or cleared by the [place] FDA and  has been authorized for detection and/or diagnosis of SARS-CoV-2 by FDA under an Emergency Use Authorization (EUA). This EUA will remain  in effect (meaning this test can be used) for the duration of the COVID-19 declaration under Section 564(b)(1) of the Act, 21 U.S.C.section 360bbb-3(b)(1), unless the authorization is terminated  or revoked sooner.       Influenza A by PCR NEGATIVE NEGATIVE Final   Influenza B by PCR NEGATIVE NEGATIVE Final    Comment: (NOTE) The Xpert Xpress SARS-CoV-2/FLU/RSV plus assay is intended as an aid in the diagnosis of influenza from Nasopharyngeal swab specimens and should  not be used as a sole basis for treatment. Nasal washings and aspirates are unacceptable for Xpert Xpress SARS-CoV-2/FLU/RSV testing.  Fact Sheet for Patients: EntrepreneurPulse.com.au  Fact Sheet for Healthcare Providers: IncredibleEmployment.be  This test is not yet approved or cleared by the [place] FDA and has been authorized for detection and/or diagnosis of SARS-CoV-2 by FDA under an Emergency Use Authorization (EUA). This EUA will remain in effect (meaning this test can be used) for the duration of the COVID-19 declaration under Section 564(b)(1) of the Act, 21 U.S.C. section 360bbb-3(b)(1), unless the authorization is terminated or revoked.  Performed at [facility], [address] [place], [place] [address]      Labs: Basic Metabolic Panel: Recent Labs  Lab 06/29/21 0426 06/30/21 0445 07/01/21 0554 07/03/21 0827 07/05/21 0433  NA 136 136 137 140 142  K 3.2* 3.8 3.9 4.6 4.2  CL 105 103 102 104 101  CO2 26 28 29 29 28   GLUCOSE 136* 118* 119* 116* 123*  BUN 43* 41* 42* 43* 40*  CREATININE 2.43* 2.22* 1.98* 1.89* 1.50*  CALCIUM 8.1* 8.1* 8.3* 8.7* 8.6*   Liver Function Tests: No results for input(s): AST, ALT, ALKPHOS, BILITOT, PROT, ALBUMIN in the last 168 hours. No results for input(s): LIPASE, AMYLASE in the last  168 hours. No results for input(s): AMMONIA in the last 168 hours. CBC: Recent Labs  Lab 07/03/21 0827 07/05/21 0433  WBC 14.2* 14.0*  HGB 9.0* 8.4*  HCT 26.5* 25.9*  MCV 93.3 96.3  PLT 214 205   Cardiac Enzymes: No results for input(s): CKTOTAL, CKMB, CKMBINDEX, TROPONINI in the last 168 hours. BNP: BNP (last 3 results) No results for input(s): BNP in the last 8760 hours.  ProBNP (last 3 results) No results for input(s): PROBNP in the last 8760 hours.  CBG: Recent Labs  Lab 06/30/21 0822 07/01/21 0810  GLUCAP 123* 124*       Signed:  Oswald Hillock MD.  [facility] 07/05/2021, 12:59 PM

## 2021-07-05 NOTE — Progress Notes (Addendum)
Chest x-ray showed pulmonary edema.  Likely from volume overload BNP elevated at 829 Will give 1 dose of Lasix 40 mg IV x1.

## 2021-07-05 NOTE — Progress Notes (Signed)
Patient was discharged earlier today, however again has worsening shortness of breath.  Oxygen requirement went up to 5 L/min.  On exam patient has bilateral wheezing. He does have history of COPD Will start scheduled DuoNeb nebulizers every 6 hours Solu-Medrol 60 mg IV every 8 hours  If patient is stable tomorrow, will likely need to be discharged on prednisone taper along with DuoNeb every 6 hours as needed

## 2021-07-06 DIAGNOSIS — J9621 Acute and chronic respiratory failure with hypoxia: Secondary | ICD-10-CM | POA: Diagnosis not present

## 2021-07-06 DIAGNOSIS — N179 Acute kidney failure, unspecified: Secondary | ICD-10-CM | POA: Diagnosis not present

## 2021-07-06 DIAGNOSIS — K5780 Diverticulitis of intestine, part unspecified, with perforation and abscess without bleeding: Secondary | ICD-10-CM | POA: Diagnosis not present

## 2021-07-06 LAB — BASIC METABOLIC PANEL
Anion gap: 9 (ref 5–15)
BUN: 49 mg/dL — ABNORMAL HIGH (ref 8–23)
CO2: 28 mmol/L (ref 22–32)
Calcium: 8.8 mg/dL — ABNORMAL LOW (ref 8.9–10.3)
Chloride: 104 mmol/L (ref 98–111)
Creatinine, Ser: 1.79 mg/dL — ABNORMAL HIGH (ref 0.61–1.24)
GFR, Estimated: 35 mL/min — ABNORMAL LOW (ref >60)
Glucose, Bld: 167 mg/dL — ABNORMAL HIGH (ref 70–99)
Potassium: 5.1 mmol/L (ref 3.5–5.1)
Sodium: 141 mmol/L (ref 135–145)

## 2021-07-06 MED ORDER — FUROSEMIDE 10 MG/ML IJ SOLN
40.0000 mg | Freq: Two times a day (BID) | INTRAMUSCULAR | Status: DC
  Administered 2021-07-06 – 2021-07-09 (×6): 40 mg via INTRAVENOUS
  Filled 2021-07-06 (×6): qty 4

## 2021-07-06 MED ORDER — GUAIFENESIN ER 600 MG PO TB12
1200.0000 mg | ORAL_TABLET | Freq: Two times a day (BID) | ORAL | Status: DC
  Administered 2021-07-06 – 2021-07-18 (×25): 1200 mg via ORAL
  Filled 2021-07-06 (×25): qty 2

## 2021-07-06 MED ORDER — FUROSEMIDE 10 MG/ML IJ SOLN
40.0000 mg | Freq: Once | INTRAMUSCULAR | Status: AC
  Administered 2021-07-06: 40 mg via INTRAVENOUS
  Filled 2021-07-06: qty 4

## 2021-07-06 NOTE — Progress Notes (Signed)
[facility]  PROGRESS NOTE   Subjective:   Progressive shortness of breath. Discharge held yesterday.   Furosemide 40mg  IV x 1 yesterday: 364mL recorded.   Objective:  Vital signs in last 24 hours:  Temp:  [97.8 F (36.6 C)-98.5 F (36.9 C)] 97.8 F (36.6 C) (11/18 1232) Pulse Rate:  [83-108] 87 (11/18 1232) Resp:  [16-20] 18 (11/18 1232) BP: (127-166)/(67-97) 140/67 (11/18 1232) SpO2:  [85 %-100 %] 96 % (11/18 1232)  Weight change:  Filed Weights   06/22/21 2134 06/26/21 1549 06/27/21 0458  Weight: 96.2 kg 103.6 kg 104.4 kg    Intake/Output: I/O last 3 completed shifts: In: 360 [P.O.:360] Out: 645 [Urine:645]   Intake/Output this shift:  Total I/O In: 360 [P.O.:360] Out: -   Physical Exam: General:  No acute distress, laying in bed  Head:  Normocephalic, atraumatic. Moist oral mucosal membranes  Eyes:  Anicteric  Neck:  Supple  Lungs:   HFNC 10 L  Heart:  regular  Abdomen:   +midline incision with staples, clean and dry  Extremities:  No peripheral edema.  Neurologic:  Awake, alert, following commands  Skin:  No lesions       Basic Metabolic Panel: Recent Labs  Lab 06/30/21 0445 07/01/21 0554 07/03/21 0827 07/05/21 0433 07/06/21 0528  NA 136 137 140 142 141  K 3.8 3.9 4.6 4.2 5.1  CL 103 102 104 101 104  CO2 28 29 29 28 28   GLUCOSE 118* 119* 116* 123* 167*  BUN 41* 42* 43* 40* 49*  CREATININE 2.22* 1.98* 1.89* 1.50* 1.79*  CALCIUM 8.1* 8.3* 8.7* 8.6* 8.8*     CBC: Recent Labs  Lab 07/03/21 0827 07/05/21 0433  WBC 14.2* 14.0*  HGB 9.0* 8.4*  HCT 26.5* 25.9*  MCV 93.3 96.3  PLT 214 205      Urinalysis: No results for input(s): COLORURINE, LABSPEC, PHURINE, GLUCOSEU, HGBUR, BILIRUBINUR, KETONESUR, PROTEINUR, UROBILINOGEN, NITRITE, LEUKOCYTESUR in the last 72 hours.  Invalid input(s): APPERANCEUR    Imaging: DG Chest Port 1 View  Result Date: 07/05/2021 CLINICAL DATA:  Shortness of breath. EXAM: PORTABLE CHEST 1  VIEW COMPARISON:  Radiograph 06/30/2021, additional prior exams reviewed. FINDINGS: Unchanged cardiomegaly. Small pleural effusions, left greater than right, slightly increased. Patchy airspace disease in the right mid lung is not significantly changed. Diffuse interstitial opacity. No pneumothorax. Stable osseous structures. IMPRESSION: 1. Unchanged cardiomegaly. Diffuse pulmonary interstitial opacity may represent pulmonary edema. 2. Small pleural effusions, left greater than right, slightly increased. 3. Unchanged patchy airspace disease in the right mid lung, indeterminate for pneumonia or scarring. Electronically Signed   By: Keith Rake M.D.   On: 07/05/2021 18:25     Medications:    sodium chloride 10 mL/hr at 06/26/21 1905    acidophilus  2 capsule Oral TID   amLODipine  10 mg Oral Daily   benzonatate  200 mg Oral TID   budesonide (PULMICORT) nebulizer solution  0.5 mg Nebulization Daily   Chlorhexidine Gluconate Cloth  6 each Topical Daily   cholecalciferol  1,000 Units Oral Daily   escitalopram  10 mg Oral Daily   famotidine  10 mg Oral BID   feeding supplement  237 mL Oral TID BM   ferrous sulfate  325 mg Oral Q breakfast   guaiFENesin  1,200 mg Oral BID   heparin injection (subcutaneous)  5,000 Units Subcutaneous Q8H   ipratropium-albuterol  3 mL Nebulization Q6H   losartan  100 mg Oral Daily   mouth  rinse  15 mL Mouth Rinse BID   methylPREDNISolone (SOLU-MEDROL) injection  90 mg Intravenous Q12H   multivitamin with minerals  1 tablet Oral Daily   traZODone  150 mg Oral QHS    Assessment/ Plan:     Active Problems:   Acute diverticulitis   AKI (acute kidney injury) ([place])   Diverticulitis   Hypoxia   Perforation of intestine due to diverticulitis of gastrointestinal tract   HAP (hospital-acquired pneumonia)   Abdominal distension (gaseous)   Pressure injury of skin   Dyspnea  Mr.Trevor Ferguson is a 87 y.o. white male with hypertension, coronary artery  disease, hyperlipidemia, COPD, obstructive sleep apnea, who is admitted to [facility] on 06/21/2021 for Diverticulitis [K57.92] AKI (acute kidney injury) ([place]) [N17.9] Acute diverticulitis [K57.92] Perforation of intestine due to diverticulitis of gastrointestinal tract [K57.80]  Patient underwent Exlap with bowel resection on 06/22/2021.   Acute kidney injury on chronic kidney disease stage IIIB with proteinuria: baseline creatinine of 1.8, GFR of 35 on 03/28/21. Followed outpatient by me, Dr. Juleen China. Acute kidney injury secondary to prerenal azotemia and ATN. Nonoliguric urine output. Creatinine close to baseline. No indication for dialysis.  - restart IV loop diuretics: furosemide 40mg  IV q12 .   Anemia of chronic kidney disease: hemoglobin 8.4, normocytic, trending down. No indication for ESA. History of iron deficiency.   - Continue ferrous sulfate.   Hypertension with chronic kidney disease: 140/67.    - Continue losartan and amlodipine - start IV furosemide as above.      LOS: [place], [facility] 11/18/202212:49 PM

## 2021-07-06 NOTE — Progress Notes (Signed)
Patient ID: Trevor Ferguson, male   DOB: [birth year], 87 y.o.   MRN: [id number]     [facility] Day(s): 15.   Interval History: Patient seen and examined.  He endorsed that he had a bad morning having a lot of cough and shortness of breath.  Denies any nausea or vomiting.  Endorses passing gas and having bowel movement.  Vital signs in last 24 hours: [min-max] current  Temp:  [97.8 F (36.6 C)-98.5 F (36.9 C)] 97.8 F (36.6 C) (11/18 1232) Pulse Rate:  [83-108] 87 (11/18 1232) Resp:  [16-20] 18 (11/18 1232) BP: (127-166)/(67-97) 140/67 (11/18 1232) SpO2:  [91 %-100 %] 96 % (11/18 1232)     Height: 5' 8.5" (174 cm) Weight: 104.4 kg BMI (Calculated): 34.48   Physical Exam:  Constitutional: alert, cooperative and no distress  Respiratory: breathing non-labored at rest  Cardiovascular: regular rate and sinus rhythm  Gastrointestinal: soft, non-tender, and non-distended.  Wound is dry and clean  Labs:  CBC Latest Ref Rng & Units 07/05/2021 07/03/2021 06/28/2021  WBC 4.0 - 10.5 K/uL 14.0(H) 14.2(H) 17.6(H)  Hemoglobin 13.0 - 17.0 g/dL 8.4(L) 9.0(L) 9.5(L)  Hematocrit 39.0 - 52.0 % 25.9(L) 26.5(L) 28.5(L)  Platelets 150 - 400 K/uL 205 214 243   CMP Latest Ref Rng & Units 07/06/2021 07/05/2021 07/03/2021  Glucose 70 - 99 mg/dL 167(H) 123(H) 116(H)  BUN 8 - 23 mg/dL 49(H) 40(H) 43(H)  Creatinine 0.61 - 1.24 mg/dL 1.79(H) 1.50(H) 1.89(H)  Sodium 135 - 145 mmol/L 141 142 140  Potassium 3.5 - 5.1 mmol/L 5.1 4.2 4.6  Chloride 98 - 111 mmol/L 104 101 104  CO2 22 - 32 mmol/L 28 28 29   Calcium 8.9 - 10.3 mg/dL 8.8(L) 8.6(L) 8.7(L)  Total Protein 6.5 - 8.1 g/dL - - -  Total Bilirubin 0.3 - 1.2 mg/dL - - -  Alkaline Phos 38 - 126 U/L - - -  AST 15 - 41 U/L - - -  ALT 0 - 44 U/L - - -    Imaging studies: No new pertinent imaging studies   Assessment/Plan:  87 y.o. male with diverticulitis of small intestine with perforation 14 Day Post-Op s/p small bowel resection  and anastomosis, complicated by pertinent comorbidities including COPD, hypertension, stage IIIb chronic kidney disease, history of CVA, sleep apnea.  Patient with persistent issues with breathing due to COPD.  He has been treated for COPD exacerbation.  There has been no clinical deterioration from the surgical standpoint.  Patient continue tolerating diet.  Today I remove the staples from the midline wound.  No contraindication to continue soft diet.  No contraindication for physical therapy from surgical standpoint.  At this point patient does not need outpatient follow-up since he had a 2-week follow-up done here in the hospital.  I will continue to remain aware while he is still in the hospital.  Arnold Long, MD

## 2021-07-06 NOTE — Progress Notes (Signed)
Physical Therapy Treatment Patient Details Name: Trevor Ferguson MRN: [id number] DOB: Dec 15, 1925 Today's Date: 07/06/2021   History of Present Illness Patient is a 87 year old male with PMH of COPD, prostate cancer, HTN, sleep apnea, stage IIIB CKD and CVA. He presented to ER with acute onset of R lower quadrant abdominal pain with nausea and dry heaves.  Patient underwent exploratory laparotomy with small bowl resection on 06/22/21. Patient fell on 06/23/21 while in hospital. Patient came from [facility] SNF    PT Comments    Pt continuing to make some progress towards therapy goals however, remains limited by oxygen requirements with exertional activities. Pt maintained >/= 90% SpO2 throughout session with bed mobility and stand step transfer to recliner chair with RW. Pt has made improvements to only requiring close SPV to complete transfers. Pt does require extended rest breaks between activities to improve shortness of breath. Pt left seated in recliner chair with all needs within reach and chair alarm on. Recommending STR at discharge to improve quality of life and maintain mobility status.     Recommendations for follow up therapy are one component of a multi-disciplinary discharge planning process, led by the attending physician.  Recommendations may be updated based on patient status, additional functional criteria and insurance authorization.  Follow Up Recommendations  Skilled nursing-short term rehab (<3 hours/day)     Assistance Recommended at Discharge Intermittent Supervision/Assistance  Equipment Recommendations  None recommended by PT    Recommendations for Other Services       Precautions / Restrictions Precautions Precautions: Fall Restrictions Weight Bearing Restrictions: No     Mobility  Bed Mobility Overal bed mobility: Modified Independent Bed Mobility: Supine to Sit     Supine to sit: Modified independent (Device/Increase time)     General bed mobility  comments: Mod I with usage of bed rails    Transfers Overall transfer level: Needs assistance Equipment used: Rolling Tomaszewski (2 wheels) Transfers: Sit to/from Stand;Bed to chair/wheelchair/BSC Sit to Stand: Supervision;From elevated surface     Step pivot transfers: Supervision     General transfer comment: close SPV for both types of transfers    Ambulation/Gait               General Gait Details: Deferred due to oxygen requirements and generalized faatigue   Stairs             Wheelchair Mobility    Modified Rankin (Stroke Patients Only)       Balance Overall balance assessment: Needs assistance Sitting-balance support: Single extremity supported;Feet supported Sitting balance-Leahy Scale: Good     Standing balance support: Bilateral upper extremity supported;During functional activity;Reliant on assistive device for balance Standing balance-Leahy Scale: Fair Standing balance comment: RW for safety for patient                            Cognition Arousal/Alertness: Awake/alert Behavior During Therapy: WFL for tasks assessed/performed Overall Cognitive Status: Within Functional Limits for tasks assessed                                          Exercises Other Exercises Other Exercises: Pt assisted with changing of this brief requiring 2 sit to stands from recliner chair which patient completed with CGA    General Comments General comments (skin integrity, edema, etc.): SpO2 remaining >/=90%  throughout activities with 9L on HFNC. Pt does continue to require additional seated rest breaks after activity      Pertinent Vitals/Pain Pain Assessment: No/denies pain    Home Living                          Prior Function            PT Goals (current goals can now be found in the care plan section) Acute Rehab PT Goals Patient Stated Goal: to return back to SNF PT Goal Formulation: With patient Time For  Goal Achievement: 07/08/21 Potential to Achieve Goals: Fair Progress towards PT goals: Progressing toward goals    Frequency    Min 2X/week      PT Plan Current plan remains appropriate    Co-evaluation              AM-PAC PT "6 Clicks" Mobility   Outcome Measure  Help needed turning from your back to your side while in a flat bed without using bedrails?: None Help needed moving from lying on your back to sitting on the side of a flat bed without using bedrails?: A Little Help needed moving to and from a bed to a chair (including a wheelchair)?: None Help needed standing up from a chair using your arms (e.g., wheelchair or bedside chair)?: A Little Help needed to walk in hospital room?: Total Help needed climbing 3-5 steps with a railing? : Total 6 Click Score: 16    End of Session Equipment Utilized During Treatment: Gait belt;Oxygen Activity Tolerance: Patient tolerated treatment well;Patient limited by fatigue Patient left: in chair;with call bell/phone within reach;with chair alarm set;with family/visitor present Nurse Communication: Mobility status PT Visit Diagnosis: Unsteadiness on feet (R26.81);Other abnormalities of gait and mobility (R26.89);Repeated falls (R29.6);Muscle weakness (generalized) (M62.81);Difficulty in walking, not elsewhere classified (R26.2);History of falling (Z91.81);Pain     Time: 1425-1450 PT Time Calculation (min) (ACUTE ONLY): 25 min  Charges:  $Therapeutic Activity: 23-37 mins                     Andrey Campanile, SPT    Andrey Campanile 07/06/2021, 3:54 PM

## 2021-07-06 NOTE — Progress Notes (Signed)
PROGRESS NOTE   As per Dr. Ouida Sills: Trevor Ferguson is a 87 y.o. male with a PMH significant for COPD, HTN, HLD, sleep apnea, CKD 3B, CVA. They presented from SNF to the ED on 06/21/2021 with abdominal pain x a few days. In the ED, it was found that they had small bowel diverticulitis with possible microperforation.  General surgery was consulted. 11/4 patient underwent exploratory laparotomy with small bowel resection 11/6 postoperative ileus 11/8 having bowel movements, 3 L O2 requirement, increased lethargy 11/10 O2 requirement 2-6S, metabolic acidosis improving with bicarb gtt, unable to tolerate PO 11/12 mental status improved. Metabolic acidosis resolved. Able to dc bicarb drip   07/03/21 -stable, improved    As per Dr. Jimmye Norman 07/04/21 & 07/06/21: Pt was on 4.5L [place] on (11/16) and normally at home, pt uses 2-2.5L [place]. Currently on 9 L HFNC and that will need to be weaned close to baseline prior to d/c. SNF was denied by pt's insurance and pt's daughter did not want to file an appeal.    Trevor Ferguson  TMH:[id number] DOB: [birth year] DOA: 06/21/2021 PCP: [name], MD  Assessment & Plan:   Active Problems:   Acute diverticulitis   AKI (acute kidney injury) ([place])   Diverticulitis   Hypoxia   Perforation of intestine due to diverticulitis of gastrointestinal tract   HAP (hospital-acquired pneumonia)   Abdominal distension (gaseous)   Pressure injury of skin   Dyspnea   Acute small bowel diverticulitis: w/ perforation s/p resection 92/1 complicated by postoperative ileus. Ileus resolved.Tolerating soft diet. Completed abx course. Continue soft diet until OP surgery follow up   Acute on chronic hypoxic respiratory failure: likely secondary early pneumonia & COPD. Radiologist read recommends f/u chest imaging in 3-4 weeks to exclude underlying malignancy. Family was made aware. Continue on supplemental oxygen, wean back to baseline as tolerated, currently on HFNC at 9.    Pulmonary edema: continue on IV lasix. Monitor I/Os. Encourage incentive spirometry & flutter valve    Encephalopathy: resolved. Palliative care following and pt will continue current course of treatment and dc with hospice/palliative to facility  AKI on CKD IIIb: Cr is labile. Nephro following    HTN: continue on amlodipine. Continue to hold ACE-I secondary to AKI on CKD    Depression: severity unknown. Continue on home dose of lexapro   GERD: continue on PPI   IDA: continue on home dose of iron supplement   DVT prophylaxis: SCDs Code Status: DNR  Family Communication: dicussed pt's care w/ pt's daughter, Trevor Ferguson, and answered her questions  Disposition Plan: likely d/c home w/ HH.  Level of care: Progressive  Status is: Inpatient  Remains inpatient appropriate because: severity of illness. Pt is on HFNC at 9 and baseline pt uses 2-2.5L . Respiratory status needs to improve prior to d/c     Consultants:  General surg   Procedures:   Antimicrobials:    Subjective: Pt c/o fatigue   Objective: Vitals:   07/05/21 2330 07/06/21 0232 07/06/21 0721 07/06/21 0818  BP: (!) 146/67   (!) 127/97  Ferguson: 83   (!) 104  Resp: 18   18  Temp: 98.4 F (36.9 C)   97.9 F (36.6 C)  TempSrc:      SpO2: 97% 100% 98% 100%  Weight:      Height:        Intake/Output Summary (Last 24 hours) at 07/06/2021 0830 Last data filed at 07/06/2021 0237 Gross per 24 hour  Intake  360 ml  Output 395 ml  Net -35 ml   Filed Weights   06/22/21 2134 06/26/21 1549 06/27/21 0458  Weight: 96.2 kg 103.6 kg 104.4 kg    Examination:  General exam: Appears uncomfortable  Respiratory system: course breath sounds b/l  Cardiovascular system: S1/S2+. No rubs or gallops  Gastrointestinal system: Abd is soft, NT, ND & hypoactive bowel sounds . Central nervous system: Alert and oriented. Moves all extremities  Psychiatry: Judgement and insight appears normal. Flat mood and affect    Data  Reviewed: I have personally reviewed following labs and imaging studies  CBC: Recent Labs  Lab 07/03/21 0827 07/05/21 0433  WBC 14.2* 14.0*  HGB 9.0* 8.4*  HCT 26.5* 25.9*  MCV 93.3 96.3  PLT 214 408   Basic Metabolic Panel: Recent Labs  Lab 06/30/21 0445 07/01/21 0554 07/03/21 0827 07/05/21 0433 07/06/21 0528  NA 136 137 140 142 141  K 3.8 3.9 4.6 4.2 5.1  CL 103 102 104 101 104  CO2 28 29 29 28 28   GLUCOSE 118* 119* 116* 123* 167*  BUN 41* 42* 43* 40* 49*  CREATININE 2.22* 1.98* 1.89* 1.50* 1.79*  CALCIUM 8.1* 8.3* 8.7* 8.6* 8.8*   GFR: Estimated Creatinine Clearance: 30.5 mL/min (A) (by C-G formula based on SCr of 1.79 mg/dL (H)). Liver Function Tests: No results for input(s): AST, ALT, ALKPHOS, BILITOT, PROT, ALBUMIN in the last 168 hours. No results for input(s): LIPASE, AMYLASE in the last 168 hours. No results for input(s): AMMONIA in the last 168 hours. Coagulation Profile: No results for input(s): INR, PROTIME in the last 168 hours. Cardiac Enzymes: No results for input(s): CKTOTAL, CKMB, CKMBINDEX, TROPONINI in the last 168 hours. BNP (last 3 results) No results for input(s): PROBNP in the last 8760 hours. HbA1C: No results for input(s): HGBA1C in the last 72 hours. CBG: Recent Labs  Lab 06/30/21 0822 07/01/21 0810 07/05/21 1413  GLUCAP 123* 124* 190*   Lipid Profile: No results for input(s): CHOL, HDL, LDLCALC, TRIG, CHOLHDL, LDLDIRECT in the last 72 hours. Thyroid Function Tests: No results for input(s): TSH, T4TOTAL, FREET4, T3FREE, THYROIDAB in the last 72 hours. Anemia Panel: Recent Labs    07/04/21 0620  FERRITIN 201  TIBC 293  IRON 72   Sepsis Labs: No results for input(s): PROCALCITON, LATICACIDVEN in the last 168 hours.  Recent Results (from the past 240 hour(s))  CULTURE, BLOOD (ROUTINE X 2) w Reflex to ID Panel     Status: None   Collection Time: 06/26/21  8:11 PM   Specimen: BLOOD  Result Value Ref Range Status   Specimen  Description BLOOD RIGHT ANTECUBITAL  Final   Special Requests   Final    BOTTLES DRAWN AEROBIC AND ANAEROBIC Blood Culture adequate volume   Culture   Final    NO GROWTH 5 DAYS Performed at [facility], [address] [place], [place] [address]    Report Status 07/01/2021 FINAL  Final  CULTURE, BLOOD (ROUTINE X 2) w Reflex to ID Panel     Status: None   Collection Time: 06/26/21  8:30 PM   Specimen: BLOOD  Result Value Ref Range Status   Specimen Description BLOOD BLOOD RIGHT FOREARM  Final   Special Requests   Final    BOTTLES DRAWN AEROBIC AND ANAEROBIC Blood Culture results may not be optimal due to an excessive volume of blood received in culture bottles   Culture   Final    NO GROWTH 5 DAYS  Performed at [facility], [address] [place], [place] [address]    Report Status 07/01/2021 FINAL  Final  Resp Panel by RT-PCR (Flu A&B, Covid) Nasopharyngeal Swab     Status: None   Collection Time: 07/03/21 12:29 PM   Specimen: Nasopharyngeal Swab; Nasopharyngeal(NP) swabs in vial transport medium  Result Value Ref Range Status   SARS Coronavirus 2 by RT PCR NEGATIVE NEGATIVE Final    Comment: (NOTE) SARS-CoV-2 target nucleic acids are NOT DETECTED.  The SARS-CoV-2 RNA is generally detectable in upper respiratory specimens during the acute phase of infection. The lowest concentration of SARS-CoV-2 viral copies this assay can detect is 138 copies/mL. A negative result does not preclude SARS-Cov-2 infection and should not be used as the sole basis for treatment or other patient management decisions. A negative result may occur with  improper specimen collection/handling, submission of specimen other than nasopharyngeal swab, presence of viral mutation(s) within the areas targeted by this assay, and inadequate number of viral copies(<138 copies/mL). A negative result must be combined with clinical observations, patient history, and epidemiological information.  The expected result is Negative.  Fact Sheet for Patients:  EntrepreneurPulse.com.au  Fact Sheet for Healthcare Providers:  IncredibleEmployment.be  This test is no t yet approved or cleared by the [place] FDA and  has been authorized for detection and/or diagnosis of SARS-CoV-2 by FDA under an Emergency Use Authorization (EUA). This EUA will remain  in effect (meaning this test can be used) for the duration of the COVID-19 declaration under Section 564(b)(1) of the Act, 21 U.S.C.section 360bbb-3(b)(1), unless the authorization is terminated  or revoked sooner.       Influenza A by PCR NEGATIVE NEGATIVE Final   Influenza B by PCR NEGATIVE NEGATIVE Final    Comment: (NOTE) The Xpert Xpress SARS-CoV-2/FLU/RSV plus assay is intended as an aid in the diagnosis of influenza from Nasopharyngeal swab specimens and should not be used as a sole basis for treatment. Nasal washings and aspirates are unacceptable for Xpert Xpress SARS-CoV-2/FLU/RSV testing.  Fact Sheet for Patients: EntrepreneurPulse.com.au  Fact Sheet for Healthcare Providers: IncredibleEmployment.be  This test is not yet approved or cleared by the [place] FDA and has been authorized for detection and/or diagnosis of SARS-CoV-2 by FDA under an Emergency Use Authorization (EUA). This EUA will remain in effect (meaning this test can be used) for the duration of the COVID-19 declaration under Section 564(b)(1) of the Act, 21 U.S.C. section 360bbb-3(b)(1), unless the authorization is terminated or revoked.  Performed at [facility], [address], [place], [place] [address]          Radiology Studies: [facility] Chest [place] 1 View  Result Date: 07/05/2021 CLINICAL DATA:  Shortness of breath. EXAM: PORTABLE CHEST 1 VIEW COMPARISON:  Radiograph 06/30/2021, additional prior exams reviewed. FINDINGS: Unchanged cardiomegaly. Small  pleural effusions, left greater than right, slightly increased. Patchy airspace disease in the right mid lung is not significantly changed. Diffuse interstitial opacity. No pneumothorax. Stable osseous structures. IMPRESSION: 1. Unchanged cardiomegaly. Diffuse pulmonary interstitial opacity may represent pulmonary edema. 2. Small pleural effusions, left greater than right, slightly increased. 3. Unchanged patchy airspace disease in the right mid lung, indeterminate for pneumonia or scarring. Electronically Signed   By: Keith Rake M.D.   On: 07/05/2021 18:25        Scheduled Meds:  acidophilus  2 capsule Oral TID   amLODipine  10 mg Oral Daily   benzonatate  200 mg Oral TID  budesonide (PULMICORT) nebulizer solution  0.5 mg Nebulization Daily   Chlorhexidine Gluconate Cloth  6 each Topical Daily   cholecalciferol  1,000 Units Oral Daily   escitalopram  10 mg Oral Daily   famotidine  10 mg Oral BID   feeding supplement  237 mL Oral TID BM   ferrous sulfate  325 mg Oral Q breakfast   heparin injection (subcutaneous)  5,000 Units Subcutaneous Q8H   ipratropium-albuterol  3 mL Nebulization Q6H   losartan  100 mg Oral Daily   mouth rinse  15 mL Mouth Rinse BID   methylPREDNISolone (SOLU-MEDROL) injection  90 mg Intravenous Q12H   multivitamin with minerals  1 tablet Oral Daily   traZODone  150 mg Oral QHS   Continuous Infusions:  sodium chloride 10 mL/hr at 06/26/21 1905     LOS: 15 days    Time spent: 34 mins     Wyvonnia Dusky, MD [facility] Pager [phone]  If 7PM-7AM, please contact night-coverage 07/06/2021, 8:30 AM

## 2021-07-07 DIAGNOSIS — I5031 Acute diastolic (congestive) heart failure: Secondary | ICD-10-CM | POA: Diagnosis not present

## 2021-07-07 DIAGNOSIS — J9601 Acute respiratory failure with hypoxia: Secondary | ICD-10-CM

## 2021-07-07 DIAGNOSIS — J441 Chronic obstructive pulmonary disease with (acute) exacerbation: Secondary | ICD-10-CM | POA: Diagnosis not present

## 2021-07-07 DIAGNOSIS — J9621 Acute and chronic respiratory failure with hypoxia: Secondary | ICD-10-CM

## 2021-07-07 DIAGNOSIS — J9611 Chronic respiratory failure with hypoxia: Secondary | ICD-10-CM

## 2021-07-07 DIAGNOSIS — N179 Acute kidney failure, unspecified: Secondary | ICD-10-CM | POA: Diagnosis not present

## 2021-07-07 DIAGNOSIS — L89312 Pressure ulcer of right buttock, stage 2: Secondary | ICD-10-CM

## 2021-07-07 LAB — BASIC METABOLIC PANEL
Anion gap: 13 (ref 5–15)
BUN: 59 mg/dL — ABNORMAL HIGH (ref 8–23)
CO2: 22 mmol/L (ref 22–32)
Calcium: 8.4 mg/dL — ABNORMAL LOW (ref 8.9–10.3)
Chloride: 104 mmol/L (ref 98–111)
Creatinine, Ser: 1.96 mg/dL — ABNORMAL HIGH (ref 0.61–1.24)
GFR, Estimated: 31 mL/min — ABNORMAL LOW (ref >60)
Glucose, Bld: 162 mg/dL — ABNORMAL HIGH (ref 70–99)
Potassium: 4.8 mmol/L (ref 3.5–5.1)
Sodium: 139 mmol/L (ref 135–145)

## 2021-07-07 LAB — CBC
HCT: 24.4 % — ABNORMAL LOW (ref 39.0–52.0)
Hemoglobin: 7.8 g/dL — ABNORMAL LOW (ref 13.0–17.0)
MCH: 31.2 pg (ref 26.0–34.0)
MCHC: 32 g/dL (ref 30.0–36.0)
MCV: 97.6 fL (ref 80.0–100.0)
Platelets: 171 10*3/uL (ref 150–400)
RBC: 2.5 MIL/uL — ABNORMAL LOW (ref 4.22–5.81)
RDW: 15.2 % (ref 11.5–15.5)
WBC: 13.1 10*3/uL — ABNORMAL HIGH (ref 4.0–10.5)
nRBC: 0 % (ref 0.0–0.2)

## 2021-07-07 MED ORDER — METHYLPREDNISOLONE SODIUM SUCC 40 MG IJ SOLR
40.0000 mg | Freq: Two times a day (BID) | INTRAMUSCULAR | Status: DC
  Administered 2021-07-07 – 2021-07-11 (×8): 40 mg via INTRAVENOUS
  Filled 2021-07-07 (×8): qty 1

## 2021-07-07 NOTE — Progress Notes (Signed)
Patient ID: Trevor Ferguson, male   DOB: 07/01/1926, 87 y.o.   MRN: [id number]     [facility] Day(s): 16.   Interval History: Patient seen and examined, no acute events or new complaints overnight. Patient reports tolerating diet. No abdominal pain.   Vital signs in last 24 hours: [min-max] current  Temp:  [97.5 F (36.4 C)-98.5 F (36.9 C)] 97.7 F (36.5 C) (11/19 1136) Pulse Rate:  [60-94] 84 (11/19 1136) Resp:  [18] 18 (11/19 1136) BP: (106-145)/(46-69) 134/62 (11/19 1136) SpO2:  [95 %-100 %] 100 % (11/19 1136)     Height: 5' 8.5" (174 cm) Weight: 104.4 kg BMI (Calculated): 34.48   Physical Exam:  Constitutional: alert, cooperative and no distress  Respiratory: breathing non-labored at rest, on nasal canula Cardiovascular: regular rate and sinus rhythm  Gastrointestinal: soft, non-tender, and non-distended. Wound is dry and clena.   Labs:  CBC Latest Ref Rng & Units 07/07/2021 07/05/2021 07/03/2021  WBC 4.0 - 10.5 K/uL 13.1(H) 14.0(H) 14.2(H)  Hemoglobin 13.0 - 17.0 g/dL 7.8(L) 8.4(L) 9.0(L)  Hematocrit 39.0 - 52.0 % 24.4(L) 25.9(L) 26.5(L)  Platelets 150 - 400 K/uL 171 205 214   CMP Latest Ref Rng & Units 07/07/2021 07/06/2021 07/05/2021  Glucose 70 - 99 mg/dL 162(H) 167(H) 123(H)  BUN 8 - 23 mg/dL 59(H) 49(H) 40(H)  Creatinine 0.61 - 1.24 mg/dL 1.96(H) 1.79(H) 1.50(H)  Sodium 135 - 145 mmol/L 139 141 142  Potassium 3.5 - 5.1 mmol/L 4.8 5.1 4.2  Chloride 98 - 111 mmol/L 104 104 101  CO2 22 - 32 mmol/L 22 28 28   Calcium 8.9 - 10.3 mg/dL 8.4(L) 8.8(L) 8.6(L)  Total Protein 6.5 - 8.1 g/dL - - -  Total Bilirubin 0.3 - 1.2 mg/dL - - -  Alkaline Phos 38 - 126 U/L - - -  AST 15 - 41 U/L - - -  ALT 0 - 44 U/L - - -    Imaging studies: No new pertinent imaging studies   Assessment/Plan:  87 y.o. male with diverticulitis of small intestine with perforation 15 Day Post-Op s/p small bowel resection and anastomosis, complicated by pertinent comorbidities  including COPD, hypertension, stage IIIb chronic kidney disease, history of CVA, sleep apnea.  Patient found sitting down on chair. Tolerating diet. Wound looking good, staples removed yesterday.  Will continue medical management of COPD.  I will follow while in hospital.   Arnold Long, MD

## 2021-07-07 NOTE — Plan of Care (Signed)

## 2021-07-07 NOTE — Progress Notes (Signed)
Patient ID: Trevor Ferguson, male   DOB: 04/19/1926, 87 y.o.   MRN: [id number] [facility] PROGRESS NOTE  CHRISTPHOR GROFT [id number]:[id number] DOB: [birth year] DOA: 06/21/2021 PCP: [name], MD  HPI/Subjective: Patient seen this morning and was sitting in the chair.  He states he feels better.  Did have a few soft bowel movements.  Does have some dry cough.  Still on high flow nasal cannula.  Objective: Vitals:   07/07/21 1136 07/07/21 1308  BP: 134/62   Pulse: 84   Resp: 18   Temp: 97.7 F (36.5 C)   SpO2: 100% 95%    Intake/Output Summary (Last 24 hours) at 07/07/2021 1332 Last data filed at 07/07/2021 0700 Gross per 24 hour  Intake 240 ml  Output 300 ml  Net -60 ml   Filed Weights   06/22/21 2134 06/26/21 1549 06/27/21 0458  Weight: 96.2 kg 103.6 kg 104.4 kg    ROS: Review of Systems  Respiratory:  Positive for shortness of breath.   Cardiovascular:  Negative for chest pain.  Gastrointestinal:  Negative for abdominal pain, nausea and vomiting.  Exam: Physical Exam HENT:     Head: Normocephalic.     Mouth/Throat:     Pharynx: No oropharyngeal exudate.  Eyes:     General: Lids are normal.     Conjunctiva/sclera: Conjunctivae normal.  Cardiovascular:     Rate and Rhythm: Normal rate and regular rhythm.     Heart sounds: Normal heart sounds, S1 normal and S2 normal.  Pulmonary:     Breath sounds: Examination of the right-lower field reveals decreased breath sounds. Examination of the left-lower field reveals decreased breath sounds. Decreased breath sounds present. No wheezing, rhonchi or rales.  Abdominal:     Palpations: Abdomen is soft.     Tenderness: There is no abdominal tenderness.  Musculoskeletal:     Right lower leg: No swelling.     Left lower leg: No swelling.  Skin:    General: Skin is warm.     Findings: No rash.  Neurological:     Mental Status: He is alert and oriented to person, place, and time.      Scheduled Meds:  acidophilus   2 capsule Oral TID   amLODipine  10 mg Oral Daily   benzonatate  200 mg Oral TID   budesonide (PULMICORT) nebulizer solution  0.5 mg Nebulization Daily   Chlorhexidine Gluconate Cloth  6 each Topical Daily   cholecalciferol  1,000 Units Oral Daily   escitalopram  10 mg Oral Daily   famotidine  10 mg Oral BID   feeding supplement  237 mL Oral TID BM   ferrous sulfate  325 mg Oral Q breakfast   furosemide  40 mg Intravenous Q12H   guaiFENesin  1,200 mg Oral BID   heparin injection (subcutaneous)  5,000 Units Subcutaneous Q8H   ipratropium-albuterol  3 mL Nebulization Q6H   losartan  100 mg Oral Daily   mouth rinse  15 mL Mouth Rinse BID   methylPREDNISolone (SOLU-MEDROL) injection  40 mg Intravenous Q12H   multivitamin with minerals  1 tablet Oral Daily   traZODone  150 mg Oral QHS   Continuous Infusions:  sodium chloride 10 mL/hr at 06/26/21 1905    Assessment/Plan:  Acute hypoxic respiratory failure.  The patient was on high flow nasal cannula 9 L this morning and tapered down to 7 L this afternoon.  On 07/05/2021 pulse ox was 85% on 2 L. Acute diastolic congestive  heart failure.  Continue IV Lasix today and watch kidney function. COPD exacerbation.  Drop Solu-Medrol dose down to 40 mg every 12 hours.  Continue nebulizer treatments.  Tremor likely secondary to high-dose steroids. Acute kidney injury on chronic kidney disease stage IIIb with proteinuria.  Patient on Lasix 40 mg IV every 12.  Creatinine 1.96 today.  Continue to monitor.  Creatinine 1.5 on 07/05/2021. Acute small bowel diverticulitis with perforation.  Patient had resection by general surgery on 06/22/2021.  Completed antibiotic course.  Had postoperative ileus. Essential hypertension on amlodipine Depression on Lexapro GERD on PPI Iron deficiency anemia.  Last hemoglobin 7.8.  Continue to monitor.  Pressure Injury 06/30/21 Buttocks Right Stage 2 -  Partial thickness loss of dermis presenting as a shallow open injury  with a red, pink wound bed without slough. (Active)  06/30/21   Location: Buttocks  Location Orientation: Right  Staging: Stage 2 -  Partial thickness loss of dermis presenting as a shallow open injury with a red, pink wound bed without slough.  Wound Description (Comments):   Present on Admission:        Code Status:     Code Status Orders  (From admission, onward)           Start     Ordered   06/21/21 2144  Do not attempt resuscitation (DNR)  Continuous       Question Answer Comment  In the event of cardiac or respiratory ARREST Do not call a "code blue"   In the event of cardiac or respiratory ARREST Do not perform Intubation, CPR, defibrillation or ACLS   In the event of cardiac or respiratory ARREST Use medication by any route, position, wound care, and other measures to relive pain and suffering. May use oxygen, suction and manual treatment of airway obstruction as needed for comfort.      06/21/21 2143           Code Status History     Date Active Date Inactive Code Status Order ID Comments User Context   01/26/2021 1842 02/02/2021 1938 DNR [id number]  Clarnce Flock, MD ED   03/06/2020 2232 03/13/2020 1745 DNR [id number]  Para Skeans, MD ED   03/06/2020 2232 03/06/2020 2232 Full Code [id number]  Para Skeans, MD ED   03/01/2020 2154 03/04/2020 2008 DNR [id number]  Christel Mormon, MD ED   07/17/2018 2112 07/19/2018 1735 DNR [id number]  Demetrios Loll, MD Inpatient      Advance Directive Documentation    Flowsheet Row Most Recent Value  Type of Advance Directive Healthcare Power of Attorney  Pre-existing out of facility DNR order (yellow form or pink MOST form) --  "MOST" Form in Place? --      Family Communication: Updated daughter on the phone Disposition Plan: Status is: Inpatient   Consultants: General surgery Nephrology  Time spent: 28 minutes  [place]

## 2021-07-07 NOTE — Progress Notes (Signed)
[facility]  PROGRESS NOTE   Subjective:   Patient states he is breathing better today. Seated in a chair. HFNC 8 L O2.   Creatinine 1.96(1.79)  Objective:  Vital signs in last 24 hours:  Temp:  [97.5 F (36.4 C)-98.5 F (36.9 C)] 97.9 F (36.6 C) (11/19 0812) Pulse Rate:  [60-94] 94 (11/19 0951) Resp:  [18] 18 (11/19 0812) BP: (106-145)/(46-69) 134/69 (11/19 0951) SpO2:  [96 %-99 %] 96 % (11/19 0812)  Weight change:  Filed Weights   06/22/21 2134 06/26/21 1549 06/27/21 0458  Weight: 96.2 kg 103.6 kg 104.4 kg    Intake/Output: I/O last 3 completed shifts: In: 600 [P.O.:600] Out: 695 [Urine:695]   Intake/Output this shift:  No intake/output data recorded.  Physical Exam: General:  No acute distress, sitting in chair  Head:  Normocephalic, atraumatic. Moist oral mucosal membranes  Eyes:  Anicteric  Neck:  Supple  Lungs:   HFNC 8 L  Heart:  regular  Abdomen:   +midline incision with staples, clean and dry  Extremities:  No peripheral edema.  Neurologic:  Awake, alert, following commands  Skin:  No lesions       Basic Metabolic Panel: Recent Labs  Lab 07/01/21 0554 07/03/21 0827 07/05/21 0433 07/06/21 0528 07/07/21 0412  NA 137 140 142 141 139  K 3.9 4.6 4.2 5.1 4.8  CL 102 104 101 104 104  CO2 29 29 28 28 22   GLUCOSE 119* 116* 123* 167* 162*  BUN 42* 43* 40* 49* 59*  CREATININE 1.98* 1.89* 1.50* 1.79* 1.96*  CALCIUM 8.3* 8.7* 8.6* 8.8* 8.4*     CBC: Recent Labs  Lab 07/03/21 0827 07/05/21 0433 07/07/21 0412  WBC 14.2* 14.0* 13.1*  HGB 9.0* 8.4* 7.8*  HCT 26.5* 25.9* 24.4*  MCV 93.3 96.3 97.6  PLT 214 205 171      Urinalysis: No results for input(s): COLORURINE, LABSPEC, PHURINE, GLUCOSEU, HGBUR, BILIRUBINUR, KETONESUR, PROTEINUR, UROBILINOGEN, NITRITE, LEUKOCYTESUR in the last 72 hours.  Invalid input(s): APPERANCEUR    Imaging: DG Chest Port 1 View  Result Date: 07/05/2021 CLINICAL DATA:  Shortness of breath.  EXAM: PORTABLE CHEST 1 VIEW COMPARISON:  Radiograph 06/30/2021, additional prior exams reviewed. FINDINGS: Unchanged cardiomegaly. Small pleural effusions, left greater than right, slightly increased. Patchy airspace disease in the right mid lung is not significantly changed. Diffuse interstitial opacity. No pneumothorax. Stable osseous structures. IMPRESSION: 1. Unchanged cardiomegaly. Diffuse pulmonary interstitial opacity may represent pulmonary edema. 2. Small pleural effusions, left greater than right, slightly increased. 3. Unchanged patchy airspace disease in the right mid lung, indeterminate for pneumonia or scarring. Electronically Signed   By: Keith Rake M.D.   On: 07/05/2021 18:25     Medications:    sodium chloride 10 mL/hr at 06/26/21 1905    acidophilus  2 capsule Oral TID   amLODipine  10 mg Oral Daily   benzonatate  200 mg Oral TID   budesonide (PULMICORT) nebulizer solution  0.5 mg Nebulization Daily   Chlorhexidine Gluconate Cloth  6 each Topical Daily   cholecalciferol  1,000 Units Oral Daily   escitalopram  10 mg Oral Daily   famotidine  10 mg Oral BID   feeding supplement  237 mL Oral TID BM   ferrous sulfate  325 mg Oral Q breakfast   furosemide  40 mg Intravenous Q12H   guaiFENesin  1,200 mg Oral BID   heparin injection (subcutaneous)  5,000 Units Subcutaneous Q8H   ipratropium-albuterol  3 mL Nebulization  Q6H   losartan  100 mg Oral Daily   mouth rinse  15 mL Mouth Rinse BID   methylPREDNISolone (SOLU-MEDROL) injection  90 mg Intravenous Q12H   multivitamin with minerals  1 tablet Oral Daily   traZODone  150 mg Oral QHS    Assessment/ Plan:     Active Problems:   Acute diverticulitis   AKI (acute kidney injury) ([place])   Diverticulitis   Hypoxia   Perforation of intestine due to diverticulitis of gastrointestinal tract   HAP (hospital-acquired pneumonia)   Abdominal distension (gaseous)   Pressure injury of skin   Dyspnea  Trevor Ferguson is a  87 y.o. white male with hypertension, coronary artery disease, hyperlipidemia, COPD, obstructive sleep apnea, who is admitted to [facility] on 06/21/2021 for Diverticulitis [K57.92] AKI (acute kidney injury) ([place]) [N17.9] Acute diverticulitis [K57.92] Perforation of intestine due to diverticulitis of gastrointestinal tract [K57.80]  Patient underwent Exlap with bowel resection on 06/22/2021.   Acute kidney injury on chronic kidney disease stage IIIB with proteinuria: baseline creatinine of 1.8, GFR of 35 on 03/28/21. Followed outpatient by me, Dr. Juleen China. Acute kidney injury secondary to prerenal azotemia and ATN. Nonoliguric urine output. Creatinine close to baseline. No indication for dialysis.  - Continue furosemide 40mg  IV q12 and reassess tomorrow.   Anemia of chronic kidney disease: hemoglobin 7.8, normocytic, trending down. No indication for ESA. History of iron deficiency.   - Continue ferrous sulfate.   Hypertension with chronic kidney disease: 134/69 - Continue losartan and amlodipine - Continue IV furosemide.     LOS: [place], [facility] 11/19/202210:27 AM

## 2021-07-08 DIAGNOSIS — J441 Chronic obstructive pulmonary disease with (acute) exacerbation: Secondary | ICD-10-CM | POA: Diagnosis not present

## 2021-07-08 DIAGNOSIS — D509 Iron deficiency anemia, unspecified: Secondary | ICD-10-CM

## 2021-07-08 DIAGNOSIS — N179 Acute kidney failure, unspecified: Secondary | ICD-10-CM | POA: Diagnosis not present

## 2021-07-08 DIAGNOSIS — J9621 Acute and chronic respiratory failure with hypoxia: Secondary | ICD-10-CM | POA: Diagnosis not present

## 2021-07-08 DIAGNOSIS — I10 Essential (primary) hypertension: Secondary | ICD-10-CM

## 2021-07-08 DIAGNOSIS — I5031 Acute diastolic (congestive) heart failure: Secondary | ICD-10-CM | POA: Diagnosis not present

## 2021-07-08 LAB — HEMOGLOBIN: Hemoglobin: 8.4 g/dL — ABNORMAL LOW (ref 13.0–17.0)

## 2021-07-08 LAB — BASIC METABOLIC PANEL
Anion gap: 10 (ref 5–15)
BUN: 82 mg/dL — ABNORMAL HIGH (ref 8–23)
CO2: 29 mmol/L (ref 22–32)
Calcium: 8.8 mg/dL — ABNORMAL LOW (ref 8.9–10.3)
Chloride: 100 mmol/L (ref 98–111)
Creatinine, Ser: 2.01 mg/dL — ABNORMAL HIGH (ref 0.61–1.24)
GFR, Estimated: 30 mL/min — ABNORMAL LOW (ref >60)
Glucose, Bld: 164 mg/dL — ABNORMAL HIGH (ref 70–99)
Potassium: 4.3 mmol/L (ref 3.5–5.1)
Sodium: 139 mmol/L (ref 135–145)

## 2021-07-08 MED ORDER — FLUTICASONE PROPIONATE 50 MCG/ACT NA SUSP
2.0000 | Freq: Every day | NASAL | Status: DC
  Administered 2021-07-08 – 2021-07-18 (×10): 2 via NASAL
  Filled 2021-07-08 (×2): qty 16

## 2021-07-08 MED ORDER — BUDESONIDE 0.5 MG/2ML IN SUSP
0.5000 mg | Freq: Two times a day (BID) | RESPIRATORY_TRACT | Status: DC
Start: 2021-07-08 — End: 2021-07-14
  Administered 2021-07-08 – 2021-07-14 (×12): 0.5 mg via RESPIRATORY_TRACT
  Filled 2021-07-08 (×12): qty 2

## 2021-07-08 MED ORDER — SALINE SPRAY 0.65 % NA SOLN
1.0000 | NASAL | Status: DC | PRN
  Administered 2021-07-08: 1 via NASAL
  Filled 2021-07-08: qty 44

## 2021-07-08 NOTE — Progress Notes (Signed)
Patient O2 sats doing well.  Turned down to 7 L.  Will continue to monitor.

## 2021-07-08 NOTE — Progress Notes (Signed)
Patient ID: Trevor Ferguson, male   DOB: November 17, 1925, 87 y.o.   MRN: [id number] [facility] PROGRESS NOTE  Trevor Ferguson LTR:[id number] DOB: [birth year] DOA: 06/21/2021 PCP: [name], MD  HPI/Subjective: Patient still on high flow nasal cannula this morning.  Complains of diarrhea when he urinates and cannot control it.  He is asking for some Imodium.  Admitted 16 days ago with recurrent acute small bowel diverticulitis and perforation.  Objective: Vitals:   07/08/21 0849 07/08/21 1114  BP: 137/68 136/72  Pulse: 94 96  Resp: 18 18  Temp: 98.1 F (36.7 C) 97.9 F (36.6 C)  SpO2: 94% 99%    Intake/Output Summary (Last 24 hours) at 07/08/2021 1303 Last data filed at 07/08/2021 1000 Gross per 24 hour  Intake 600 ml  Output 500 ml  Net 100 ml   Filed Weights   06/22/21 2134 06/26/21 1549 06/27/21 0458  Weight: 96.2 kg 103.6 kg 104.4 kg    ROS: Review of Systems  Respiratory:  Positive for shortness of breath.   Cardiovascular:  Negative for chest pain.  Gastrointestinal:  Positive for diarrhea. Negative for abdominal pain, nausea and vomiting.  Exam: Physical Exam HENT:     Head: Normocephalic.     Mouth/Throat:     Pharynx: No oropharyngeal exudate.  Eyes:     General: Lids are normal.     Conjunctiva/sclera: Conjunctivae normal.  Cardiovascular:     Rate and Rhythm: Normal rate. Rhythm irregularly irregular.     Heart sounds: Normal heart sounds, S1 normal and S2 normal.  Pulmonary:     Breath sounds: Examination of the right-middle field reveals decreased breath sounds. Examination of the left-middle field reveals decreased breath sounds. Examination of the right-lower field reveals decreased breath sounds and rhonchi. Examination of the left-lower field reveals decreased breath sounds and rhonchi. Decreased breath sounds and rhonchi present. No wheezing or rales.  Abdominal:     Palpations: Abdomen is soft.     Tenderness: There is no abdominal  tenderness.  Musculoskeletal:     Right lower leg: Swelling present.     Left lower leg: Swelling present.  Skin:    General: Skin is warm.     Findings: No rash.  Neurological:     Mental Status: He is alert and oriented to person, place, and time.      Scheduled Meds:  acidophilus  2 capsule Oral TID   amLODipine  10 mg Oral Daily   benzonatate  200 mg Oral TID   budesonide (PULMICORT) nebulizer solution  0.5 mg Nebulization BID   Chlorhexidine Gluconate Cloth  6 each Topical Daily   cholecalciferol  1,000 Units Oral Daily   escitalopram  10 mg Oral Daily   famotidine  10 mg Oral BID   feeding supplement  237 mL Oral TID BM   ferrous sulfate  325 mg Oral Q breakfast   fluticasone  2 spray Each Nare Daily   furosemide  40 mg Intravenous Q12H   guaiFENesin  1,200 mg Oral BID   heparin injection (subcutaneous)  5,000 Units Subcutaneous Q8H   ipratropium-albuterol  3 mL Nebulization Q6H   losartan  100 mg Oral Daily   mouth rinse  15 mL Mouth Rinse BID   methylPREDNISolone (SOLU-MEDROL) injection  40 mg Intravenous Q12H   multivitamin with minerals  1 tablet Oral Daily   traZODone  150 mg Oral QHS   Continuous Infusions:  sodium chloride 10 mL/hr at 06/26/21 1905  Assessment/Plan:  Acute hypoxic respiratory failure.  On 07/05/2021 pulse ox was 85% on 2 L.  This morning was on 9 L high flow nasal cannula and tapered down to 8 L.  With good pulse ox hopefully can taper down a little bit further.  Nursing staff just turned down to 7 L. Acute diastolic congestive heart failure.  Case discussed with nephrology and will continue IV Lasix today and continue to watch kidney function closely. COPD exacerbation.  Continue Solu-Medrol 40 mg every 12 hours.  Continue nebulizer treatments.  Increase budesonide to twice daily dosing. Acute kidney injury on chronic kidney disease stage IIIb.  Continue Lasix 40 mg IV every 12 hours today.  Creatinine 1.5 on 07/05/2021.  Today's creatinine  2.01 Perforated diverticulitis of small bowel status post operative repair on 06/22/2021 Essential hypertension on amlodipine Depression on Lexapro GERD on PPI Iron deficiency anemia.  Hemoglobin 8.4 today Stage II decubiti right buttock.  Present on admission.  See full description below.  Pressure Injury 06/30/21 Buttocks Right Stage 2 -  Partial thickness loss of dermis presenting as a shallow open injury with a red, pink wound bed without slough. (Active)  06/30/21   Location: Buttocks  Location Orientation: Right  Staging: Stage 2 -  Partial thickness loss of dermis presenting as a shallow open injury with a red, pink wound bed without slough.  Wound Description (Comments):   Present on Admission:        Code Status:     Code Status Orders  (From admission, onward)           Start     Ordered   06/21/21 2144  Do not attempt resuscitation (DNR)  Continuous       Question Answer Comment  In the event of cardiac or respiratory ARREST Do not call a "code blue"   In the event of cardiac or respiratory ARREST Do not perform Intubation, CPR, defibrillation or ACLS   In the event of cardiac or respiratory ARREST Use medication by any route, position, wound care, and other measures to relive pain and suffering. May use oxygen, suction and manual treatment of airway obstruction as needed for comfort.      06/21/21 2143           Code Status History     Date Active Date Inactive Code Status Order ID Comments User Context   01/26/2021 1842 02/02/2021 1938 DNR [id number]  Clarnce Flock, MD ED   03/06/2020 2232 03/13/2020 1745 DNR [id number]  Para Skeans, MD ED   03/06/2020 2232 03/06/2020 2232 Full Code [id number]  Para Skeans, MD ED   03/01/2020 2154 03/04/2020 2008 DNR [id number]  Sidney Ace, Arvella Merles, MD ED   07/17/2018 2112 07/19/2018 1735 DNR [id number]  Demetrios Loll, MD Inpatient      Advance Directive Documentation    Flowsheet Row Most Recent Value  Type of Advance Directive  Healthcare Power of Attorney  Pre-existing out of facility DNR order (yellow form or pink MOST form) --  "MOST" Form in Place? --      Family Communication: Updated patient's daughter on the phone Disposition Plan: Status is: Inpatient  Consultants: Nephrology General surgery  Procedures: Abdominal small bowel perforation  Time spent: 27 minutes  [place]

## 2021-07-08 NOTE — Progress Notes (Signed)
[facility]  PROGRESS NOTE   Subjective:   Patient states he is breathing better today. Seated in a chair. HFNC 6 L O2.   Creatinine 2.01 (1.96) (1.79)   UOP recorded at 556mL - remains on furosemide IV 40mg  q12  Objective:  Vital signs in last 24 hours:  Temp:  [97.5 F (36.4 C)-98.1 F (36.7 C)] 97.9 F (36.6 C) (11/20 1114) Pulse Rate:  [71-96] 96 (11/20 1114) Resp:  [16-21] 18 (11/20 1114) BP: (115-137)/(56-74) 136/72 (11/20 1114) SpO2:  [94 %-100 %] 99 % (11/20 1311)  Weight change:  Filed Weights   06/22/21 2134 06/26/21 1549 06/27/21 0458  Weight: 96.2 kg 103.6 kg 104.4 kg    Intake/Output: I/O last 3 completed shifts: In: 240 [P.O.:240] Out: 800 [Urine:800]   Intake/Output this shift:  Total I/O In: 600 [P.O.:600] Out: -   Physical Exam: General:  No acute distress, sitting in chair  Head:  Normocephalic, atraumatic. Moist oral mucosal membranes  Eyes:  Anicteric  Neck:  Supple  Lungs:   HFNC 6 L  Heart:  regular  Abdomen:   +midline incision with staples, clean and dry  Extremities:  No peripheral edema.  Neurologic:  Awake, alert, following commands  Skin:  No lesions       Basic Metabolic Panel: Recent Labs  Lab 07/03/21 0827 07/05/21 0433 07/06/21 0528 07/07/21 0412 07/08/21 0535  NA 140 142 141 139 139  K 4.6 4.2 5.1 4.8 4.3  CL 104 101 104 104 100  CO2 29 28 28 22 29   GLUCOSE 116* 123* 167* 162* 164*  BUN 43* 40* 49* 59* 82*  CREATININE 1.89* 1.50* 1.79* 1.96* 2.01*  CALCIUM 8.7* 8.6* 8.8* 8.4* 8.8*     CBC: Recent Labs  Lab 07/03/21 0827 07/05/21 0433 07/07/21 0412 07/08/21 0535  WBC 14.2* 14.0* 13.1*  --   HGB 9.0* 8.4* 7.8* 8.4*  HCT 26.5* 25.9* 24.4*  --   MCV 93.3 96.3 97.6  --   PLT 214 205 171  --       Urinalysis: No results for input(s): COLORURINE, LABSPEC, PHURINE, GLUCOSEU, HGBUR, BILIRUBINUR, KETONESUR, PROTEINUR, UROBILINOGEN, NITRITE, LEUKOCYTESUR in the last 72 hours.  Invalid  input(s): APPERANCEUR    Imaging: No results found.   Medications:    sodium chloride 10 mL/hr at 06/26/21 1905    acidophilus  2 capsule Oral TID   amLODipine  10 mg Oral Daily   benzonatate  200 mg Oral TID   budesonide (PULMICORT) nebulizer solution  0.5 mg Nebulization BID   Chlorhexidine Gluconate Cloth  6 each Topical Daily   cholecalciferol  1,000 Units Oral Daily   escitalopram  10 mg Oral Daily   famotidine  10 mg Oral BID   feeding supplement  237 mL Oral TID BM   ferrous sulfate  325 mg Oral Q breakfast   fluticasone  2 spray Each Nare Daily   furosemide  40 mg Intravenous Q12H   guaiFENesin  1,200 mg Oral BID   heparin injection (subcutaneous)  5,000 Units Subcutaneous Q8H   ipratropium-albuterol  3 mL Nebulization Q6H   losartan  100 mg Oral Daily   mouth rinse  15 mL Mouth Rinse BID   methylPREDNISolone (SOLU-MEDROL) injection  40 mg Intravenous Q12H   multivitamin with minerals  1 tablet Oral Daily   traZODone  150 mg Oral QHS    Assessment/ Plan:     Active Problems:   COPD with acute exacerbation ([place])  Acute diverticulitis   AKI (acute kidney injury) ([place])   Diverticulitis   Hypoxia   Perforation of intestine due to diverticulitis of gastrointestinal tract   HAP (hospital-acquired pneumonia)   Abdominal distension (gaseous)   Pressure injury of skin   Dyspnea   Acute on chronic respiratory failure with hypoxia ([facility])   Acute diastolic congestive heart failure [facility])  Mr.JAMESEN STAHNKE is a 87 y.o. white male with hypertension, coronary artery disease, hyperlipidemia, COPD, obstructive sleep apnea, who is admitted to [facility] on 06/21/2021 for Diverticulitis [K57.92] AKI (acute kidney injury) ([place]) [N17.9] Acute diverticulitis [K57.92] Perforation of intestine due to diverticulitis of gastrointestinal tract [K57.80]  Patient underwent Exlap with bowel resection on 06/22/2021.   Acute kidney injury on chronic kidney disease stage IIIB with  proteinuria: baseline creatinine of 1.8, GFR of 35 on 03/28/21. Acute kidney injury secondary to prerenal azotemia and ATN. Nonoliguric urine output. Creatinine close to baseline. No indication for dialysis.  - Continue furosemide 40mg  IV q12 and reassess daily for diuretic need.   Anemia of chronic kidney disease: hemoglobin 8.4, normocytic. No indication for ESA. History of iron deficiency.   - Continue ferrous sulfate.   Hypertension with chronic kidney disease: 136/72 - Continue losartan and amlodipine - Continue IV furosemide.     LOS: [place], [facility] 11/20/20222:01 PM

## 2021-07-09 ENCOUNTER — Inpatient Hospital Stay: Payer: Medicare PPO

## 2021-07-09 DIAGNOSIS — J9621 Acute and chronic respiratory failure with hypoxia: Secondary | ICD-10-CM | POA: Diagnosis not present

## 2021-07-09 DIAGNOSIS — J441 Chronic obstructive pulmonary disease with (acute) exacerbation: Secondary | ICD-10-CM | POA: Diagnosis not present

## 2021-07-09 DIAGNOSIS — I5031 Acute diastolic (congestive) heart failure: Secondary | ICD-10-CM | POA: Diagnosis not present

## 2021-07-09 DIAGNOSIS — N179 Acute kidney failure, unspecified: Secondary | ICD-10-CM | POA: Diagnosis not present

## 2021-07-09 LAB — BASIC METABOLIC PANEL
Anion gap: 8 (ref 5–15)
BUN: 85 mg/dL — ABNORMAL HIGH (ref 8–23)
CO2: 29 mmol/L (ref 22–32)
Calcium: 8.5 mg/dL — ABNORMAL LOW (ref 8.9–10.3)
Chloride: 102 mmol/L (ref 98–111)
Creatinine, Ser: 2.14 mg/dL — ABNORMAL HIGH (ref 0.61–1.24)
GFR, Estimated: 28 mL/min — ABNORMAL LOW (ref >60)
Glucose, Bld: 191 mg/dL — ABNORMAL HIGH (ref 70–99)
Potassium: 4.6 mmol/L (ref 3.5–5.1)
Sodium: 139 mmol/L (ref 135–145)

## 2021-07-09 MED ORDER — HYDROCODONE BIT-HOMATROP MBR 5-1.5 MG/5ML PO SOLN
5.0000 mL | Freq: Once | ORAL | Status: AC
  Administered 2021-07-09: 5 mL via ORAL
  Filled 2021-07-09: qty 5

## 2021-07-09 MED ORDER — FUROSEMIDE 10 MG/ML IJ SOLN
4.0000 mg/h | INTRAVENOUS | Status: DC
  Administered 2021-07-09 – 2021-07-12 (×3): 4 mg/h via INTRAVENOUS
  Filled 2021-07-09 (×3): qty 20

## 2021-07-09 NOTE — Plan of Care (Signed)
Patient slept well, unable to maintain SPO2 between 89% TO 92% on 6 LHFNC, increased oxygen to [facility], patient was able to maintain SPO2 within his normal range after titration.  No BM, VSS.    Problem: Education: Goal: Knowledge of General Education information will improve Description: Including pain rating scale, medication(s)/side effects and non-pharmacologic comfort measures Outcome: Progressing   Problem: Health Behavior/Discharge Planning: Goal: Ability to manage health-related needs will improve Outcome: Progressing   Problem: Clinical Measurements: Goal: Ability to maintain clinical measurements within normal limits will improve Outcome: Progressing Goal: Will remain free from infection Outcome: Progressing Goal: Diagnostic test results will improve Outcome: Progressing Goal: Respiratory complications will improve Outcome: Progressing Goal: Cardiovascular complication will be avoided Outcome: Progressing

## 2021-07-09 NOTE — Progress Notes (Signed)
[facility]  PROGRESS NOTE   Subjective:   Patient had a coughing spell during this evaluation. Otherwise he was sitting in the chair.  Oxygen at 5 L States that his appetite is starting to pick up but it depends on the food Wife and daughter at bedside    Objective:  Vital signs in last 24 hours:  Temp:  [97.8 F (36.6 C)-99 F (37.2 C)] 97.8 F (36.6 C) (11/21 0426) Pulse Rate:  [85-96] 86 (11/21 0426) Resp:  [15-19] 17 (11/21 0426) BP: (120-151)/(57-72) 131/70 (11/21 0426) SpO2:  [90 %-99 %] 93 % (11/21 0822)  Weight change:  Filed Weights   06/22/21 2134 06/26/21 1549 06/27/21 0458  Weight: 96.2 kg 103.6 kg 104.4 kg    Intake/Output: I/O last 3 completed shifts: In: 53 [P.O.:1560] Out: 750 [Urine:750]   Intake/Output this shift:  Total I/O In: -  Out: 600 [Urine:600]  Physical Exam: General:  No acute distress, sitting in chair  Head:  Normocephalic, atraumatic. Moist oral mucosal membranes  Eyes:  Anicteric  Lungs:   HFNC 6-7 L, decreased breath sounds, mild crackles at bases  Heart:  regular  Abdomen:   +midline incision with staples  Extremities:  2+ peripheral edema.  Neurologic:  Awake, alert, following commands  Skin:  No lesions       Basic Metabolic Panel: Recent Labs  Lab 07/05/21 0433 07/06/21 0528 07/07/21 0412 07/08/21 0535 07/09/21 0524  NA 142 141 139 139 139  K 4.2 5.1 4.8 4.3 4.6  CL 101 104 104 100 102  CO2 28 28 22 29 29   GLUCOSE 123* 167* 162* 164* 191*  BUN 40* 49* 59* 82* 85*  CREATININE 1.50* 1.79* 1.96* 2.01* 2.14*  CALCIUM 8.6* 8.8* 8.4* 8.8* 8.5*     CBC: Recent Labs  Lab 07/03/21 0827 07/05/21 0433 07/07/21 0412 07/08/21 0535  WBC 14.2* 14.0* 13.1*  --   HGB 9.0* 8.4* 7.8* 8.4*  HCT 26.5* 25.9* 24.4*  --   MCV 93.3 96.3 97.6  --   PLT 214 205 171  --       Urinalysis: No results for input(s): COLORURINE, LABSPEC, PHURINE, GLUCOSEU, HGBUR, BILIRUBINUR, KETONESUR, PROTEINUR,  UROBILINOGEN, NITRITE, LEUKOCYTESUR in the last 72 hours.  Invalid input(s): APPERANCEUR    Imaging: No results found.   Medications:    sodium chloride 10 mL/hr at 06/26/21 1905    acidophilus  2 capsule Oral TID   amLODipine  10 mg Oral Daily   benzonatate  200 mg Oral TID   budesonide (PULMICORT) nebulizer solution  0.5 mg Nebulization BID   Chlorhexidine Gluconate Cloth  6 each Topical Daily   cholecalciferol  1,000 Units Oral Daily   escitalopram  10 mg Oral Daily   famotidine  10 mg Oral BID   feeding supplement  237 mL Oral TID BM   ferrous sulfate  325 mg Oral Q breakfast   fluticasone  2 spray Each Nare Daily   furosemide  40 mg Intravenous Q12H   guaiFENesin  1,200 mg Oral BID   heparin injection (subcutaneous)  5,000 Units Subcutaneous Q8H   ipratropium-albuterol  3 mL Nebulization Q6H   losartan  100 mg Oral Daily   mouth rinse  15 mL Mouth Rinse BID   methylPREDNISolone (SOLU-MEDROL) injection  40 mg Intravenous Q12H   multivitamin with minerals  1 tablet Oral Daily   traZODone  150 mg Oral QHS    Assessment/ Plan:     Active Problems:  COPD with acute exacerbation ([place])   Acute diverticulitis   AKI (acute kidney injury) ([place])   Diverticulitis   Hypoxia   Perforation of intestine due to diverticulitis of gastrointestinal tract   HAP (hospital-acquired pneumonia)   Abdominal distension (gaseous)   Pressure injury of skin   Dyspnea   Acute on chronic respiratory failure with hypoxia (H[facility])   Acute diastolic congestive heart failure [facility])  Mr.NADIA TORR is a 87 y.o. white male with hypertension, coronary artery disease, hyperlipidemia, COPD, obstructive sleep apnea, who is admitted to [facility] on 06/21/2021 for Diverticulitis [K57.92] AKI (acute kidney injury) ([place]) [N17.9] Acute diverticulitis [K57.92] Perforation of intestine due to diverticulitis of gastrointestinal tract [K57.80]  Patient underwent Exlap with bowel resection on 06/22/2021.    Acute kidney injury on chronic kidney disease stage IIIB with proteinuria: baseline creatinine of 1.5, GFR of 43 on 07/05/21. Acute kidney injury secondary to prerenal azotemia and ATN. Nonoliguric urine output.  No indication for dialysis.   Rising creatinine is concerning.  We will consider changing furosemide to infusion form.  Avoid hypotension.   Anemia of chronic kidney disease: hemoglobin 8.4, normocytic. No indication for ESA. History of iron deficiency.   - Continue ferrous sulfate.   Hypertension with chronic kidney disease:  - Continue losartan and amlodipine    4.  Lower extremity edema.  Currently managed with furosemide bolus 40 mg twice a day.  Consider changing to furosemide infusion.   LOS: [place], MD [facility] 11/21/202210:06 AM

## 2021-07-09 NOTE — Progress Notes (Addendum)
Physical Therapy Treatment Patient Details Name: Trevor Ferguson MRN: [id number] DOB: [birth year] Today's Date: 07/09/2021   History of Present Illness Patient is a 87 year old male with PMH of COPD, prostate cancer, HTN, sleep apnea, stage IIIB CKD and CVA. He presented to ER with acute onset of R lower quadrant abdominal pain with nausea and dry heaves.  Patient underwent exploratory laparotomy with small bowl resection on 06/22/21. Patient fell on 06/23/21 while in hospital. Patient came from [facility]    PT Comments    Patient agreeable to PT and making progress towards meeting goals. He was able to stand x 2 bouts with standing tolerance of up to 2 minutes. Activity tolerance overall is limited. Sp02 dropped to 85% briefly after first stand and and remains in the low 90's with second standing bout on 5 L02. Cues for breathing techniques and energy conservation. Recommend to continue PT to maximize independence and facilitate return to prior level of function. SNF is recommended at discharge.    Recommendations for follow up therapy are one component of a multi-disciplinary discharge planning process, led by the attending physician.  Recommendations may be updated based on patient status, additional functional criteria and insurance authorization.  Follow Up Recommendations  Skilled nursing-short term rehab (<3 hours/day)     Assistance Recommended at Discharge Intermittent Supervision/Assistance  Equipment Recommendations  None recommended by PT    Recommendations for Other Services       Precautions / Restrictions Precautions Precautions: Fall Restrictions Weight Bearing Restrictions: No     Mobility  Bed Mobility               General bed mobility comments: not assessed as patient sitting up on arrival and post session    Transfers Overall transfer level: Needs assistance Equipment used: Rolling [place] (2 wheels) Transfers: Sit to/from Stand Sit to Stand: Min  assist           General transfer comment: lifting assistance provided for standing. emphasis on eccentric control with transfers. 2 bouts of standing performed    Ambulation/Gait             Pre-gait activities: standing tolerance of 1 minute 30 seconds and then 2 minutes with UE supported on rolling [place]. shortness of breath is noted with exertional activity with cues for breathing techniques provided. after first stand, Sp02 85% initially and quickly increased to 90% with rest break. with second standing bout, Sp02 remained in the low 90's. General Gait Details: not assessed due to poor standing tolerance, generalized LE weakness. pre-gait activity performed to increased standing tolerance in preparation for gait training.   Stairs             Wheelchair Mobility    Modified Rankin (Stroke Patients Only)       Balance           Standing balance support: Reliant on assistive device for balance;Bilateral upper extremity supported Standing balance-Leahy Scale: Fair Standing balance comment: Min guard to stand by assistance provided for safety with standing. no loss of balance                            Cognition Arousal/Alertness: Awake/alert Behavior During Therapy: WFL for tasks assessed/performed Overall Cognitive Status: Within Functional Limits for tasks assessed  General Comments: patient able to follow all commands with increased time        Exercises Other Exercises Other Exercises: encouraged ankle pumps, LAQ, and hip flexion from seated position. patient demonstrated correct technique x 1-2 reps each. patient remained sitting up in chair at end of session but declined to have LE reclined.    General Comments        Pertinent Vitals/Pain Pain Assessment: No/denies pain    Home Living                          Prior Function            PT Goals (current goals can now  be found in the care plan section) Acute Rehab PT Goals Patient Stated Goal: to return back to SNF PT Goal Formulation: With patient Time For Goal Achievement: 07/23/21 Potential to Achieve Goals: Good Progress towards PT goals: Progressing toward goals    Frequency    Min 2X/week      PT Plan Current plan remains appropriate (goals and care plan remain appropriate. adjusted the plan of care for 2 more weeks.)    Co-evaluation              AM-PAC PT "6 Clicks" Mobility   Outcome Measure  Help needed turning from your back to your side while in a flat bed without using bedrails?: A Little Help needed moving from lying on your back to sitting on the side of a flat bed without using bedrails?: A Little Help needed moving to and from a bed to a chair (including a wheelchair)?: A Little Help needed standing up from a chair using your arms (e.g., wheelchair or bedside chair)?: A Little Help needed to walk in hospital room?: A Lot Help needed climbing 3-5 steps with a railing? : Total 6 Click Score: 15    End of Session Equipment Utilized During Treatment: Oxygen Activity Tolerance: Patient tolerated treatment well Patient left: in chair;with call bell/phone within reach;with chair alarm set   PT Visit Diagnosis: Unsteadiness on feet (R26.81);Other abnormalities of gait and mobility (R26.89);Repeated falls (R29.6);Muscle weakness (generalized) (M62.81);Difficulty in walking, not elsewhere classified (R26.2);History of falling (Z91.81);Pain     Time: 9371-6967 PT Time Calculation (min) (ACUTE ONLY): 23 min  Charges:  $Therapeutic Activity: 23-37 mins                     Minna Merritts, PT, MPT   Percell Locus 07/09/2021, 3:50 PM

## 2021-07-09 NOTE — Care Management Important Message (Signed)
Important Message  Patient Details  Name: MANOJ ENRIQUEZ MRN: [id number] Date of Birth: 09-Jan-1926   Medicare Important Message Given:  Yes     Dannette Barbara 07/09/2021, 5:09 PM

## 2021-07-09 NOTE — Progress Notes (Signed)
Patient ID: JOSELITO FIELDHOUSE, male   DOB: [birth year], 87 y.o.   MRN: [id number] [facility] PROGRESS NOTE  ANCEL EASLER [id number]:[id number] DOB: [birth year] DOA: 06/21/2021 PCP: [name], MD  HPI/Subjective: Patient feels okay.  He was on 8 L high flow nasal cannula when I saw him and I dialed him down to 6 L.  Patient was still saturating well.  States his breathing is getting a little bit better.  Admitted 17 days ago with recurrent acute small bowel diverticulitis with perforation.  Objective: Vitals:   07/09/21 0822 07/09/21 1213  BP:  (!) 136/59  Pulse:  79  Resp:  17  Temp:  97.8 F (36.6 C)  SpO2: 93% 97%    Intake/Output Summary (Last 24 hours) at 07/09/2021 1324 Last data filed at 07/09/2021 1016 Gross per 24 hour  Intake 1320 ml  Output 1150 ml  Net 170 ml   Filed Weights   06/22/21 2134 06/26/21 1549 06/27/21 0458  Weight: 96.2 kg 103.6 kg 104.4 kg    ROS: Review of Systems  Respiratory:  Positive for shortness of breath.   Cardiovascular:  Negative for chest pain.  Gastrointestinal:  Negative for abdominal pain, nausea and vomiting.  Exam: Physical Exam HENT:     Head: Normocephalic.     Mouth/Throat:     Pharynx: No oropharyngeal exudate.  Eyes:     General: Lids are normal.     Conjunctiva/sclera: Conjunctivae normal.  Cardiovascular:     Rate and Rhythm: Normal rate and regular rhythm.     Heart sounds: Normal heart sounds, S1 normal and S2 normal.  Pulmonary:     Breath sounds: Examination of the right-lower field reveals decreased breath sounds and rhonchi. Examination of the left-lower field reveals decreased breath sounds and rhonchi. Decreased breath sounds and rhonchi present. No wheezing or rales.  Abdominal:     Palpations: Abdomen is soft.     Tenderness: There is no abdominal tenderness.  Musculoskeletal:     Right lower leg: Swelling present.     Left lower leg: Swelling present.  Skin:    General: Skin is warm.     Findings:  No rash.  Neurological:     Mental Status: He is alert and oriented to person, place, and time.      Scheduled Meds:  acidophilus  2 capsule Oral TID   amLODipine  10 mg Oral Daily   benzonatate  200 mg Oral TID   budesonide (PULMICORT) nebulizer solution  0.5 mg Nebulization BID   cholecalciferol  1,000 Units Oral Daily   escitalopram  10 mg Oral Daily   famotidine  10 mg Oral BID   feeding supplement  237 mL Oral TID BM   ferrous sulfate  325 mg Oral Q breakfast   fluticasone  2 spray Each Nare Daily   guaiFENesin  1,200 mg Oral BID   heparin injection (subcutaneous)  5,000 Units Subcutaneous Q8H   ipratropium-albuterol  3 mL Nebulization Q6H   losartan  100 mg Oral Daily   mouth rinse  15 mL Mouth Rinse BID   methylPREDNISolone (SOLU-MEDROL) injection  40 mg Intravenous Q12H   multivitamin with minerals  1 tablet Oral Daily   traZODone  150 mg Oral QHS   Continuous Infusions:  sodium chloride 10 mL/hr at 06/26/21 1905   furosemide (LASIX) 200 mg in dextrose 5% 100 mL (2mg /mL) infusion      Assessment/Plan:  Acute hypoxic respiratory failure.  Patient was on 8  L high flow nasal cannula and I dialed him down to 6 L.  Continue tapering down since pulse ox is good.  Looks like down to 5 L this afternoon. Acute diastolic congestive heart failure.  Nephrology changed over to Lasix drip.  Continue to watch renal function. COPD exacerbation.  Patient moving better air today.  Continue Solu-Medrol 40 mg IV every 12 hours.  Continue nebulizer treatments.  Continue budesonide nebulizers. Acute kidney injury on chronic kidney disease stage IIIb.  Creatinine 1.50 on the 17th.  Up to 2.14 today.  Nephrology to change Lasix over to Lasix drip. Perforated diverticulitis of small bowel status post operative repair on 06/22/2021.  Patient still having some diarrhea.  As needed Imodium. Essential hypertension on amlodipine Depression on Lexapro GERD on PPI Iron deficiency anemia.  Hemoglobin  8.4 yesterday Stage II decubiti right buttock.  Present on admission.  See full description below  Pressure Injury 06/30/21 Buttocks Right Stage 2 -  Partial thickness loss of dermis presenting as a shallow open injury with a red, pink wound bed without slough. (Active)  06/30/21   Location: Buttocks  Location Orientation: Right  Staging: Stage 2 -  Partial thickness loss of dermis presenting as a shallow open injury with a red, pink wound bed without slough.  Wound Description (Comments):   Present on Admission:        Code Status:     Code Status Orders  (From admission, onward)           Start     Ordered   06/21/21 2144  Do not attempt resuscitation (DNR)  Continuous       Question Answer Comment  In the event of cardiac or respiratory ARREST Do not call a "code blue"   In the event of cardiac or respiratory ARREST Do not perform Intubation, CPR, defibrillation or ACLS   In the event of cardiac or respiratory ARREST Use medication by any route, position, wound care, and other measures to relive pain and suffering. May use oxygen, suction and manual treatment of airway obstruction as needed for comfort.      06/21/21 2143           Code Status History     Date Active Date Inactive Code Status Order ID Comments User Context   01/26/2021 1842 02/02/2021 1938 DNR [id number]  Clarnce Flock, MD ED   03/06/2020 2232 03/13/2020 1745 DNR [id number]  Para Skeans, MD ED   03/06/2020 2232 03/06/2020 2232 Full Code [id number]  Para Skeans, MD ED   03/01/2020 2154 03/04/2020 2008 DNR [id number]  Christel Mormon, MD ED   07/17/2018 2112 07/19/2018 1735 DNR [id number]  Demetrios Loll, MD Inpatient      Advance Directive Documentation    Flowsheet Row Most Recent Value  Type of Advance Directive Healthcare Power of Attorney  Pre-existing out of facility DNR order (yellow form or pink MOST form) --  "MOST" Form in Place? --      Family Communication: Spoke with wife and daughter at  the bedside Disposition Plan: Status is: Inpatient  Consultants: Nephrology General surgery  Time spent: 27 minutes  [place]

## 2021-07-10 DIAGNOSIS — F32A Depression, unspecified: Secondary | ICD-10-CM

## 2021-07-10 DIAGNOSIS — J441 Chronic obstructive pulmonary disease with (acute) exacerbation: Secondary | ICD-10-CM | POA: Diagnosis not present

## 2021-07-10 DIAGNOSIS — I5031 Acute diastolic (congestive) heart failure: Secondary | ICD-10-CM | POA: Diagnosis not present

## 2021-07-10 DIAGNOSIS — K5780 Diverticulitis of intestine, part unspecified, with perforation and abscess without bleeding: Secondary | ICD-10-CM | POA: Diagnosis not present

## 2021-07-10 DIAGNOSIS — J9621 Acute and chronic respiratory failure with hypoxia: Secondary | ICD-10-CM | POA: Diagnosis not present

## 2021-07-10 LAB — BASIC METABOLIC PANEL
Anion gap: 9 (ref 5–15)
BUN: 91 mg/dL — ABNORMAL HIGH (ref 8–23)
CO2: 31 mmol/L (ref 22–32)
Calcium: 8.3 mg/dL — ABNORMAL LOW (ref 8.9–10.3)
Chloride: 100 mmol/L (ref 98–111)
Creatinine, Ser: 1.92 mg/dL — ABNORMAL HIGH (ref 0.61–1.24)
GFR, Estimated: 32 mL/min — ABNORMAL LOW (ref >60)
Glucose, Bld: 184 mg/dL — ABNORMAL HIGH (ref 70–99)
Potassium: 4.3 mmol/L (ref 3.5–5.1)
Sodium: 140 mmol/L (ref 135–145)

## 2021-07-10 LAB — GLUCOSE, CAPILLARY: Glucose-Capillary: 199 mg/dL — ABNORMAL HIGH (ref 70–99)

## 2021-07-10 MED ORDER — ACETYLCYSTEINE 10 % IN SOLN
4.0000 mL | Freq: Two times a day (BID) | RESPIRATORY_TRACT | Status: DC
  Administered 2021-07-10: 4 mL via RESPIRATORY_TRACT
  Filled 2021-07-10 (×5): qty 4

## 2021-07-10 MED ORDER — HYDROCOD POLST-CPM POLST ER 10-8 MG/5ML PO SUER
5.0000 mL | Freq: Two times a day (BID) | ORAL | Status: DC
  Administered 2021-07-10 – 2021-07-15 (×10): 5 mL via ORAL
  Filled 2021-07-10 (×10): qty 5

## 2021-07-10 NOTE — Progress Notes (Signed)
Physical Therapy Treatment Patient Details Name: Trevor Ferguson MRN: [id number] DOB: July 06, 1926 Today's Date: 07/10/2021   History of Present Illness Patient is a 87 year old male with PMH of COPD, prostate cancer, HTN, sleep apnea, stage IIIB CKD and CVA. He presented to ER with acute onset of R lower quadrant abdominal pain with nausea and dry heaves.  Patient underwent exploratory laparotomy with small bowl resection on 06/22/21. Patient fell on 06/23/21 while in hospital. Patient came from [facility]    PT Comments    Patient agreeable to get out of bed and participate with PT. He had increased activity tolerance and progressed to ambulating a short distance in the room with rolling [place]. Patient has mild shortness of breath during activity with Sp02 85% after walking on 4 L02. Sp02 quickly increased to the low 90's with seated rest break and cues for breathing techniques. Recommend to continue PT to maximize independence and facilitate return to prior level of function.     Recommendations for follow up therapy are one component of a multi-disciplinary discharge planning process, led by the attending physician.  Recommendations may be updated based on patient status, additional functional criteria and insurance authorization.  Follow Up Recommendations  Skilled nursing-short term rehab (<3 hours/day)     Assistance Recommended at Discharge Intermittent Supervision/Assistance  Equipment Recommendations  None recommended by PT    Recommendations for Other Services       Precautions / Restrictions Precautions Precautions: Fall Restrictions Weight Bearing Restrictions: No     Mobility  Bed Mobility Overal bed mobility: Needs Assistance Bed Mobility: Supine to Sit     Supine to sit: Supervision (HOb almost flat and no use of bed rails)     General bed mobility comments: supervision for safety. increased time and effort required with bed mobility efforts     Transfers Overall transfer level: Needs assistance Equipment used: Rolling [place] (2 wheels) Transfers: Sit to/from Stand;Bed to chair/wheelchair/BSC Sit to Stand: Min guard     Step pivot transfers: Min guard     General transfer comment: patient performed stand step transfer from bed to bed side commode with Min guard for safety and cues for technique. sit to stand from bed side commode with Min guard with good safety awarenes demonstrated    Ambulation/Gait Ambulation/Gait assistance: Min guard Gait Distance (Feet): 6 Feet Assistive device: Rolling [place] (2 wheels) Gait Pattern/deviations: Decreased stride length Gait velocity: decreased     General Gait Details: patient ambulated a short distance in the room with rolling [place] for support. patient is steady with no loss of balance, however is short of breath with activity. Sp02 decreased to 85% and increased to 91% within 30-45 seconds after seated rest break and cues for breathing techniques. patient has some difficulty with pursed lip breathing. he was on 4 L02.   Stairs             Wheelchair Mobility    Modified Rankin (Stroke Patients Only)       Balance Overall balance assessment: Needs assistance Sitting-balance support: Single extremity supported;Feet supported Sitting balance-Leahy Scale: Good     Standing balance support: Reliant on assistive device for balance;Bilateral upper extremity supported Standing balance-Leahy Scale: Fair Standing balance comment: Min guard for safety                            Cognition Arousal/Alertness: Awake/alert Behavior During Therapy: WFL for tasks assessed/performed Overall  Cognitive Status: Within Functional Limits for tasks assessed                                 General Comments: patient able to follow commands without difficulty        Exercises      General Comments        Pertinent Vitals/Pain Pain Assessment:  No/denies pain    Home Living                          Prior Function            PT Goals (current goals can now be found in the care plan section) Acute Rehab PT Goals Patient Stated Goal: to return back to SNF PT Goal Formulation: With patient Time For Goal Achievement: 07/23/21 Potential to Achieve Goals: Good Progress towards PT goals: Progressing toward goals    Frequency    Min 2X/week      PT Plan Current plan remains appropriate    Co-evaluation              AM-PAC PT "6 Clicks" Mobility   Outcome Measure  Help needed turning from your back to your side while in a flat bed without using bedrails?: A Little Help needed moving from lying on your back to sitting on the side of a flat bed without using bedrails?: A Little Help needed moving to and from a bed to a chair (including a wheelchair)?: A Little Help needed standing up from a chair using your arms (e.g., wheelchair or bedside chair)?: A Little Help needed to walk in hospital room?: A Little Help needed climbing 3-5 steps with a railing? : Total 6 Click Score: 16    End of Session         PT Visit Diagnosis: Unsteadiness on feet (R26.81);Other abnormalities of gait and mobility (R26.89);Repeated falls (R29.6);Muscle weakness (generalized) (M62.81);Difficulty in walking, not elsewhere classified (R26.2);History of falling (Z91.81);Pain     Time: 7078-6754 PT Time Calculation (min) (ACUTE ONLY): 23 min  Charges:  $Therapeutic Activity: 23-37 mins                     Minna Merritts, PT, MPT    Percell Locus 07/10/2021, 3:15 PM

## 2021-07-10 NOTE — Progress Notes (Signed)
Nutrition Follow-up  DOCUMENTATION CODES:   Obesity unspecified  INTERVENTION:   -Continue Ensure Enlive po TID, each supplement provides 350 kcal and 20 grams of protein  -Continue MVI with minerals daily  NUTRITION DIAGNOSIS:   Inadequate oral intake related to acute illness as evidenced by other (comment) (pt on clear liquid diet).  Progressing; advanced to soft diet on 06/30/21  GOAL:   Patient will meet greater than or equal to 90% of their needs  Progressing   MONITOR:   PO intake, Supplement acceptance, Labs, Weight trends, Skin, I & O's, Diet advancement  REASON FOR ASSESSMENT:   NPO/Clear Liquid Diet    ASSESSMENT:   87 y/o male with h/o COPD, hypertension, prostate cancer, stage IIIb chronic kidney disease, CVA and sleep apnea who is admitted with peforated diverticulits now s/p ex lap with small bowel resection with removal of 33cm of jejunum on 79/1 complicated by post op ileus.  11/4- s/p Exploratory laparotomy with small bowel resection 11/5- pt pulled out NGT, advanced to clear liquid diet 11/9- advanced to full liquid diet 11/12- s/p BSE- advanced to a mechanical soft diet  Reviewed I/O's: -1.8 L x 24 hours and +3.2 L since 06/26/21  UOP: 2.7 L x 24 hours   Pt tolerating current diet consistency well. Pt consuming 25-100% of meals. Pt is taking Ensure Enlive supplements.   Pt continues to await medical stability for discharge; he is currently on 7 L HFNC.   Medications reviewed and include ferrous sulfate, vitamin D, and solu-medrol.   Labs reviewed: CBGS: 199 (inpatient orders for glycemic control are none).    Diet Order:   Diet Order             Diet - low sodium heart healthy           DIET SOFT Room service appropriate? Yes with Assist; Fluid consistency: Thin  Diet effective now                   EDUCATION NEEDS:   Not appropriate for education at this time  Skin:  Skin Assessment: Skin Integrity Issues: Skin Integrity  Issues:: Stage II Stage II: buttocks Incisions: closed abdomen  Last BM:  07/10/21  Height:   Ht Readings from Last 1 Encounters:  06/22/21 5' 8.5" (1.74 m)    Weight:   Wt Readings from Last 1 Encounters:  06/27/21 104.4 kg    Ideal Body Weight:  71.36 kg  BMI:  Body mass index is 34.49 kg/m.  Estimated Nutritional Needs:   Kcal:  1900-2200kcal/day  Protein:  95-110g/day  Fluid:  1.8-2.1L/day    Loistine Chance, RD, LDN, [place] Registered Dietitian II Certified Diabetes Care and Education Specialist Please refer to AMION for RD and/or RD on-call/weekend/after hours pager

## 2021-07-10 NOTE — Progress Notes (Signed)
Patient ID: Trevor Ferguson, male   DOB: 12-01-1925, 87 y.o.   MRN: [id number] [facility] PROGRESS NOTE  Trevor Ferguson:[id number] DOB: 02/17/1926 DOA: 06/21/2021 PCP: [name], MD  HPI/Subjective: Patient feeling a little bit better.  Not complaining of shortness of breath.  Does have a little bit of a cough.  Objective: Vitals:   07/10/21 1104 07/10/21 1428  BP: (!) 149/63   Pulse: 75   Resp: 18   Temp: 98.2 F (36.8 C)   SpO2: 92% 95%    Intake/Output Summary (Last 24 hours) at 07/10/2021 1541 Last data filed at 07/10/2021 1400 Gross per 24 hour  Intake 720 ml  Output 2425 ml  Net -1705 ml   Filed Weights   06/22/21 2134 06/26/21 1549 06/27/21 0458  Weight: 96.2 kg 103.6 kg 104.4 kg    ROS: Review of Systems  Respiratory:  Positive for cough. Negative for shortness of breath.   Cardiovascular:  Negative for chest pain.  Gastrointestinal:  Negative for abdominal pain, nausea and vomiting.  Exam: Physical Exam HENT:     Head: Normocephalic.     Mouth/Throat:     Pharynx: No oropharyngeal exudate.  Eyes:     General: Lids are normal.     Conjunctiva/sclera: Conjunctivae normal.  Cardiovascular:     Rate and Rhythm: Normal rate. Rhythm irregularly irregular.     Heart sounds: Normal heart sounds, S1 normal and S2 normal.  Pulmonary:     Breath sounds: Examination of the right-lower field reveals decreased breath sounds. Examination of the left-lower field reveals decreased breath sounds. Decreased breath sounds present. No wheezing, rhonchi or rales.     Comments: Coughing with deep breath today. Abdominal:     Palpations: Abdomen is soft.     Tenderness: There is no abdominal tenderness.  Musculoskeletal:     Right lower leg: Swelling present.     Left lower leg: Swelling present.  Skin:    General: Skin is warm.  Neurological:     Mental Status: He is alert and oriented to person, place, and time.      Scheduled Meds:   acetylcysteine  4 mL Nebulization BID   acidophilus  2 capsule Oral TID   amLODipine  10 mg Oral Daily   benzonatate  200 mg Oral TID   budesonide (PULMICORT) nebulizer solution  0.5 mg Nebulization BID   chlorpheniramine-HYDROcodone  5 mL Oral Q12H   cholecalciferol  1,000 Units Oral Daily   escitalopram  10 mg Oral Daily   famotidine  10 mg Oral BID   feeding supplement  237 mL Oral TID BM   ferrous sulfate  325 mg Oral Q breakfast   fluticasone  2 spray Each Nare Daily   guaiFENesin  1,200 mg Oral BID   heparin injection (subcutaneous)  5,000 Units Subcutaneous Q8H   ipratropium-albuterol  3 mL Nebulization Q6H   losartan  100 mg Oral Daily   mouth rinse  15 mL Mouth Rinse BID   methylPREDNISolone (SOLU-MEDROL) injection  40 mg Intravenous Q12H   multivitamin with minerals  1 tablet Oral Daily   traZODone  150 mg Oral QHS   Continuous Infusions:  sodium chloride 10 mL/hr at 06/26/21 1905   furosemide (LASIX) 200 mg in dextrose 5% 100 mL (2mg /mL) infusion 4 mg/hr (07/10/21 0446)   Brief history.  87 year old man with end-stage COPD on oxygen, essential hypertension, history of prostate cancer, sleep apnea, history of stroke, CKD.  Patient had a  perforated small bowel diverticulitis and had operative repair on 06/22/2021.  Postoperatively he had exacerbation of COPD and heart failure.  Patient has been on high flow nasal cannula up until today where he was able to come over to 4 L regular nasal cannula.  Assessment/Plan:  Acute on chronic hypoxic respiratory failure.  This morning when I saw him he was on 7 L high flow nasal cannula.  I dialed him down to 4 L high flow nasal cannula.  This afternoon able to flip over to regular nasal cannula.  Normally wears 2.5 to 3 L of oxygen chronically.  Probably will end up being 2.5 at rest and 6 L with ambulation upon discharge COPD exacerbation.  Patient moving better air today but coughing a lot more. Acute diastolic congestive heart failure.   Last EF 60 to 65% patient on low-dose Lasix drip. Acute kidney injury on chronic kidney disease stage IIIb.  Creatinine 1.50 on the 17th and up to 2.14 on 07/09/2021.  Today's creatinine 1.92. Perforated diverticulitis of small bowel status post operative repair on 06/22/2021 by Dr. Windell Moment still having diarrhea.  As needed Imodium.  Completed antibiotic course. Essential hypertension on amlodipine Depression on Lexapro GERD on PPI Iron deficiency anemia.  Last hemoglobin 8.4 Stage II decubiti right buttock.  Present on admission.  See full description below. Weakness.  We will go back to [facility] rehab once stable for discharge  Pressure Injury 06/30/21 Buttocks Right Stage 2 -  Partial thickness loss of dermis presenting as a shallow open injury with a red, pink wound bed without slough. (Active)  06/30/21   Location: Buttocks  Location Orientation: Right  Staging: Stage 2 -  Partial thickness loss of dermis presenting as a shallow open injury with a red, pink wound bed without slough.  Wound Description (Comments):   Present on Admission:        Code Status:     Code Status Orders  (From admission, onward)           Start     Ordered   06/21/21 2144  Do not attempt resuscitation (DNR)  Continuous       Question Answer Comment  In the event of cardiac or respiratory ARREST Do not call a "code blue"   In the event of cardiac or respiratory ARREST Do not perform Intubation, CPR, defibrillation or ACLS   In the event of cardiac or respiratory ARREST Use medication by any route, position, wound care, and other measures to relive pain and suffering. May use oxygen, suction and manual treatment of airway obstruction as needed for comfort.      06/21/21 2143           Code Status History     Date Active Date Inactive Code Status Order ID Comments User Context   01/26/2021 1842 02/02/2021 1938 DNR [id number]  Clarnce Flock, MD ED   03/06/2020 2232 03/13/2020 1745 DNR  [id number]  Para Skeans, MD ED   03/06/2020 2232 03/06/2020 2232 Full Code [id number]  Para Skeans, MD ED   03/01/2020 2154 03/04/2020 2008 DNR [id number]  Christel Mormon, MD ED   07/17/2018 2112 07/19/2018 1735 DNR [id number]  Demetrios Loll, MD Inpatient      Advance Directive Documentation    Flowsheet Row Most Recent Value  Type of Advance Directive Healthcare Power of Attorney  Pre-existing out of facility DNR order (yellow form or pink MOST form) --  "MOST" Form in Place? --  Family Communication: Updated patient's daughter on the phone Disposition Plan: Status is: Inpatient  Time spent: 28 minutes  [place]

## 2021-07-10 NOTE — Progress Notes (Signed)
PT Cancellation Note  Patient Details Name: Trevor Ferguson MRN: [id number] DOB: 28-Jul-1926   Cancelled Treatment:    Reason Eval/Treat Not Completed: Fatigue/lethargy limiting ability to participate. Patient reports he just got back to bed and is fatigued, requesting PT follow up at a later time.   Minna Merritts, PT, MPT  Percell Locus 07/10/2021, 9:02 AM

## 2021-07-10 NOTE — TOC Progression Note (Signed)
Transition of Care [facility]) - Progression Note    Patient Details  Name: [name] MRN: [id number] Date of Birth: Apr 08, 1926  Transition of Care [facility]) CM/SW [place], [place] Phone Number: 07/10/2021, 8:42 AM  Clinical Narrative:     CSW notes insurance had denied SNF coverage, patient is from [place] with plans to return at time of discharge. Patient continues to be on 7L HFNC, working on weaning O2 needs at this time.   TOC will continue to follow.    Expected Discharge Plan: Long Term Nursing Home Barriers to Discharge: Continued Medical Work up  Expected Discharge Plan and Services Expected Discharge Plan: [place]       Living arrangements for the past 2 months: [place] Expected Discharge Date: 07/05/21                                     Social Determinants of Health (SDOH) Interventions    Readmission Risk Interventions Readmission Risk Prevention Plan 06/23/2021 01/29/2021  Transportation Screening Complete Complete  PCP or Specialist Appt within 3-5 Days Complete Complete  HRI or Home Care Consult Complete Complete  Social Work Consult for [place] Planning/Counseling Complete Complete  Palliative Care Screening Not Applicable Not Applicable  Medication Review Press photographer) Complete Complete  Some recent data might be hidden

## 2021-07-10 NOTE — Progress Notes (Signed)
[facility]  PROGRESS NOTE   Subjective:   Patient resting comfortably in bed, alert and oriented Tolerating meals appropriately Denies nausea and vomiting Reports shortness of breath with exertion, but reports improved respiratory status  Oxygen weaned to 4L [place], dry cough   Objective:  Vital signs in last 24 hours:  Temp:  [97.5 F (36.4 C)-98.2 F (36.8 C)] 98.2 F (36.8 C) (11/22 1104) Pulse Rate:  [65-99] 75 (11/22 1104) Resp:  [18-20] 18 (11/22 1104) BP: (133-149)/(59-73) 149/63 (11/22 1104) SpO2:  [90 %-98 %] 95 % (11/22 1428)  Weight change:  Filed Weights   06/22/21 2134 06/26/21 1549 06/27/21 0458  Weight: 96.2 kg 103.6 kg 104.4 kg    Intake/Output: I/O last 3 completed shifts: In: 1082.6 [P.O.:1080; I.V.:2.6] Out: 3225 [Urine:3225]   Intake/Output this shift:  Total I/O In: 480 [P.O.:480] Out: 350 [Urine:350]  Physical Exam: General:  No acute distress, resting in bed  Head:  Normocephalic, atraumatic. Moist oral mucosal membranes  Eyes:  Anicteric  Lungs:    decreased breath sounds, mild crackles at bases, O2 4L  Heart:  regular  Abdomen:   +midline incision with staples  Extremities:  2+ peripheral edema.  Neurologic:  Awake, alert, following commands  Skin:  No lesions       Basic Metabolic Panel: Recent Labs  Lab 07/06/21 0528 07/07/21 0412 07/08/21 0535 07/09/21 0524 07/10/21 0805  NA 141 139 139 139 140  K 5.1 4.8 4.3 4.6 4.3  CL 104 104 100 102 100  CO2 28 22 29 29 31   GLUCOSE 167* 162* 164* 191* 184*  BUN 49* 59* 82* 85* 91*  CREATININE 1.79* 1.96* 2.01* 2.14* 1.92*  CALCIUM 8.8* 8.4* 8.8* 8.5* 8.3*     CBC: Recent Labs  Lab 07/05/21 0433 07/07/21 0412 07/08/21 0535  WBC 14.0* 13.1*  --   HGB 8.4* 7.8* 8.4*  HCT 25.9* 24.4*  --   MCV 96.3 97.6  --   PLT 205 171  --       Urinalysis: No results for input(s): COLORURINE, LABSPEC, PHURINE, GLUCOSEU, HGBUR, BILIRUBINUR, KETONESUR, PROTEINUR,  UROBILINOGEN, NITRITE, LEUKOCYTESUR in the last 72 hours.  Invalid input(s): APPERANCEUR    Imaging: DG Chest 2 View  Result Date: 07/09/2021 CLINICAL DATA:  Shortness of breath. History of COPD, hypertension pneumonia. Former smoker EXAM: CHEST - 2 VIEW COMPARISON:  None. FINDINGS: The heart is enlarged. Hyperinflated lungs with reticular opacities in the right middle lung as well as bilateral lung bases are unchanged. Bilateral pleural effusions, left greater than right with bibasilar atelectasis is also unchanged. Osteopenia with mild thoracic kyphosis. IMPRESSION: 1.  Stable cardiomegaly. 2. COPD. Reticular opacity in right middle lung as well as in bilateral lung bases with small bilateral pleural effusions concerning for pulmonary edema/infiltrate or chronic interstitial lung disease, not significantly changed. Electronically Signed   By: Keane Police D.O.   On: 07/09/2021 11:02     Medications:    sodium chloride 10 mL/hr at 06/26/21 1905   furosemide (LASIX) 200 mg in dextrose 5% 100 mL (2mg /mL) infusion 4 mg/hr (07/10/21 0446)    acetylcysteine  4 mL Nebulization BID   acidophilus  2 capsule Oral TID   amLODipine  10 mg Oral Daily   benzonatate  200 mg Oral TID   budesonide (PULMICORT) nebulizer solution  0.5 mg Nebulization BID   chlorpheniramine-HYDROcodone  5 mL Oral Q12H   cholecalciferol  1,000 Units Oral Daily   escitalopram  10 mg Oral  Daily   famotidine  10 mg Oral BID   feeding supplement  237 mL Oral TID BM   ferrous sulfate  325 mg Oral Q breakfast   fluticasone  2 spray Each Nare Daily   guaiFENesin  1,200 mg Oral BID   heparin injection (subcutaneous)  5,000 Units Subcutaneous Q8H   ipratropium-albuterol  3 mL Nebulization Q6H   losartan  100 mg Oral Daily   mouth rinse  15 mL Mouth Rinse BID   methylPREDNISolone (SOLU-MEDROL) injection  40 mg Intravenous Q12H   multivitamin with minerals  1 tablet Oral Daily   traZODone  150 mg Oral QHS    Assessment/  Plan:     Active Problems:   COPD with acute exacerbation ([place])   Acute diverticulitis   AKI (acute kidney injury) ([place])   Diverticulitis   Hypoxia   Perforation of intestine due to diverticulitis of gastrointestinal tract   HAP (hospital-acquired pneumonia)   Abdominal distension (gaseous)   Pressure injury of skin   Dyspnea   Acute on chronic respiratory failure with hypoxia (HCC)   Acute diastolic congestive heart failure [facility])  Trevor Ferguson is a 87 y.o. white male with hypertension, coronary artery disease, hyperlipidemia, COPD, obstructive sleep apnea, who is admitted to [facility] on 06/21/2021 for Diverticulitis [K57.92] AKI (acute kidney injury) ([place]) [N17.9] Acute diverticulitis [K57.92] Perforation of intestine due to diverticulitis of gastrointestinal tract [K57.80]  Patient underwent Exlap with bowel resection on 06/22/2021.   Acute kidney injury on chronic kidney disease stage IIIB with proteinuria: baseline creatinine of 1.5, GFR of 43 on 07/05/21. Acute kidney injury secondary to prerenal azotemia and ATN. Nonoliguric urine output.  No indication for dialysis.   -Creatinine improving  - Monitoring renal function with furosemide drip  Anemia of chronic kidney disease: hemoglobin 8.4, normocytic. No indication for ESA. History of iron deficiency.   - Continue ferrous sulfate.   Hypertension with chronic kidney disease:  - Continue losartan and amlodipine    4.  Lower extremity edema.   Continue lasix drip.   LOS: [facility] 11/22/20222:37 PM

## 2021-07-11 DIAGNOSIS — I5031 Acute diastolic (congestive) heart failure: Secondary | ICD-10-CM | POA: Diagnosis not present

## 2021-07-11 DIAGNOSIS — R140 Abdominal distension (gaseous): Secondary | ICD-10-CM | POA: Diagnosis not present

## 2021-07-11 DIAGNOSIS — J9621 Acute and chronic respiratory failure with hypoxia: Secondary | ICD-10-CM | POA: Diagnosis not present

## 2021-07-11 DIAGNOSIS — L89159 Pressure ulcer of sacral region, unspecified stage: Secondary | ICD-10-CM

## 2021-07-11 DIAGNOSIS — K5792 Diverticulitis of intestine, part unspecified, without perforation or abscess without bleeding: Secondary | ICD-10-CM | POA: Diagnosis not present

## 2021-07-11 DIAGNOSIS — R0602 Shortness of breath: Secondary | ICD-10-CM

## 2021-07-11 LAB — CBC
HCT: 27 % — ABNORMAL LOW (ref 39.0–52.0)
Hemoglobin: 9 g/dL — ABNORMAL LOW (ref 13.0–17.0)
MCH: 30.8 pg (ref 26.0–34.0)
MCHC: 33.3 g/dL (ref 30.0–36.0)
MCV: 92.5 fL (ref 80.0–100.0)
Platelets: 200 10*3/uL (ref 150–400)
RBC: 2.92 MIL/uL — ABNORMAL LOW (ref 4.22–5.81)
RDW: 14.8 % (ref 11.5–15.5)
WBC: 9 10*3/uL (ref 4.0–10.5)
nRBC: 0 % (ref 0.0–0.2)

## 2021-07-11 MED ORDER — ACETYLCYSTEINE 20 % IN SOLN
4.0000 mL | Freq: Two times a day (BID) | RESPIRATORY_TRACT | Status: DC
  Administered 2021-07-11 – 2021-07-15 (×8): 4 mL via RESPIRATORY_TRACT
  Filled 2021-07-11 (×10): qty 4

## 2021-07-11 NOTE — Progress Notes (Addendum)
[facility]  PROGRESS NOTE   Subjective:   Patient sitting up in recliner, resting quietly Per nursing note, oxygen saturations decreased to 80s overnight.  Patient placed on high flow 7 L oxygen.  Patient currently remains on this oxygen requirement.  Edema slightly improved bilateral lower extremities.  Patient states no change in respiratory status  Urine output 2.4L in past 24 hours   Objective:  Vital signs in last 24 hours:  Temp:  [96.8 F (36 C)-97.9 F (36.6 C)] 97.8 F (36.6 C) (11/23 1135) Pulse Rate:  [59-87] 87 (11/23 1135) Resp:  [14-20] 17 (11/23 1135) BP: (125-158)/(53-90) 133/62 (11/23 1135) SpO2:  [88 %-100 %] 93 % (11/23 1315)  Weight change:  Filed Weights   06/22/21 2134 06/26/21 1549 06/27/21 0458  Weight: 96.2 kg 103.6 kg 104.4 kg    Intake/Output: I/O last 3 completed shifts: In: 1000.5 [P.O.:960; I.V.:40.5] Out: 4525 [Urine:4525]   Intake/Output this shift:  Total I/O In: -  Out: 450 [Urine:450]  Physical Exam: General:  No acute distress, resting in bed  Head:  Normocephalic, atraumatic. Moist oral mucosal membranes  Eyes:  Anicteric  Lungs:    decreased breath sounds, mild rhonchi/crackles at bases, O2 7L  Heart:  regular  Abdomen:   +midline incision with staples  Extremities:  1+ peripheral edema.  Neurologic:  Awake, alert, following commands  Skin:  No lesions       Basic Metabolic Panel: Recent Labs  Lab 07/06/21 0528 07/07/21 0412 07/08/21 0535 07/09/21 0524 07/10/21 0805  NA 141 139 139 139 140  K 5.1 4.8 4.3 4.6 4.3  CL 104 104 100 102 100  CO2 28 22 29 29 31   GLUCOSE 167* 162* 164* 191* 184*  BUN 49* 59* 82* 85* 91*  CREATININE 1.79* 1.96* 2.01* 2.14* 1.92*  CALCIUM 8.8* 8.4* 8.8* 8.5* 8.3*     CBC: Recent Labs  Lab 07/05/21 0433 07/07/21 0412 07/08/21 0535 07/11/21 0355  WBC 14.0* 13.1*  --  9.0  HGB 8.4* 7.8* 8.4* 9.0*  HCT 25.9* 24.4*  --  27.0*  MCV 96.3 97.6  --  92.5  PLT 205 171   --  200      Urinalysis: No results for input(s): COLORURINE, LABSPEC, PHURINE, GLUCOSEU, HGBUR, BILIRUBINUR, KETONESUR, PROTEINUR, UROBILINOGEN, NITRITE, LEUKOCYTESUR in the last 72 hours.  Invalid input(s): APPERANCEUR    Imaging: No results found.   Medications:    sodium chloride 10 mL/hr at 06/26/21 1905   furosemide (LASIX) 200 mg in dextrose 5% 100 mL (2mg /mL) infusion 6 mg/hr (07/11/21 1140)    acetylcysteine  4 mL Nebulization BID   acidophilus  2 capsule Oral TID   amLODipine  10 mg Oral Daily   benzonatate  200 mg Oral TID   budesonide (PULMICORT) nebulizer solution  0.5 mg Nebulization BID   chlorpheniramine-HYDROcodone  5 mL Oral Q12H   cholecalciferol  1,000 Units Oral Daily   escitalopram  10 mg Oral Daily   famotidine  10 mg Oral BID   feeding supplement  237 mL Oral TID BM   ferrous sulfate  325 mg Oral Q breakfast   fluticasone  2 spray Each Nare Daily   guaiFENesin  1,200 mg Oral BID   heparin injection (subcutaneous)  5,000 Units Subcutaneous Q8H   ipratropium-albuterol  3 mL Nebulization Q6H   losartan  100 mg Oral Daily   mouth rinse  15 mL Mouth Rinse BID   multivitamin with minerals  1 tablet Oral  Daily   traZODone  150 mg Oral QHS    Assessment/ Plan:     Active Problems:   COPD with acute exacerbation ([place])   Acute diverticulitis   AKI (acute kidney injury) ([place])   Diverticulitis   Hypoxia   Perforation of intestine due to diverticulitis of gastrointestinal tract   HAP (hospital-acquired pneumonia)   Abdominal distension (gaseous)   Pressure injury of skin   Dyspnea   Acute on chronic respiratory failure with hypoxia (HCC)   Acute diastolic congestive heart failure [facility])  Mr.Trevor Ferguson is a 87 y.o. white male with hypertension, coronary artery disease, hyperlipidemia, COPD, obstructive sleep apnea, who is admitted to [facility] on 06/21/2021 for Diverticulitis [K57.92] AKI (acute kidney injury) ([place]) [N17.9] Acute diverticulitis  [K57.92] Perforation of intestine due to diverticulitis of gastrointestinal tract [K57.80]  Patient underwent Exlap with bowel resection on 06/22/2021.   Acute kidney injury on chronic kidney disease stage IIIB with proteinuria: baseline creatinine of 1.5, GFR of 43 on 07/05/21. Acute kidney injury secondary to prerenal azotemia and ATN. Nonoliguric urine output.  No indication for dialysis.   -Creatinine continues to improve  - Monitoring renal function with furosemide drip  Anemia of chronic kidney disease: hemoglobin 8.4, normocytic. No indication for ESA. History of iron deficiency.   - Continue ferrous sulfate.   Hypertension with chronic kidney disease:  - Continue losartan and amlodipine    4.  Lower extremity edema.   Will increase Lasix drip to 6 mg/h.   LOS: [facility] 11/23/20221:52 PM

## 2021-07-11 NOTE — Progress Notes (Signed)
Initially patient was on 4L O2 via [place] at the beginning of the shift. Pt started destains to mid 60s while sitting up in the chair and sleeping. O2 increased to 6L via [place] by Rt. Once patient was in the bed laying, he on and off desaturated but recovered on 6L once wake. After multiple of cycle of waking him up. [place] was switched to HFNC at 7L by RT.

## 2021-07-11 NOTE — TOC Progression Note (Signed)
Transition of Care [facility]) - Progression Note    Patient Details  Name: Trevor Ferguson MRN: [id number] Date of Birth: May 01, 1926  Transition of Care [facility]) CM/SW [place], LCSW Phone Number: 07/11/2021, 12:49 PM  Clinical Narrative: Trevor Ferguson to Trevor Ferguson, social worker at [facility]. She said maximum oxygen they can take him back on would be 5 L, no high flow.  Expected Discharge Plan: Long Term Nursing Home Barriers to Discharge: Continued Medical Work up  Expected Discharge Plan and Services Expected Discharge Plan: [place]       Living arrangements for the past 2 months: [place] Expected Discharge Date: 07/05/21                                     Social Determinants of Health (SDOH) Interventions    Readmission Risk Interventions Readmission Risk Prevention Plan 06/23/2021 01/29/2021  Transportation Screening Complete Complete  PCP or Specialist Appt within 3-5 Days Complete Complete  HRI or Home Care Consult Complete Complete  Social Work Consult for [place] Planning/Counseling Complete Complete  Palliative Care Screening Not Applicable Not Applicable  Medication Review Press photographer) Complete Complete  Some recent data might be hidden

## 2021-07-11 NOTE — Care Management Important Message (Signed)
Important Message  Patient Details  Name: WILLIAM SCHAKE MRN: [id number] Date of Birth: 09/20/1925   Medicare Important Message Given:  Yes     Dannette Barbara 07/11/2021, 12:01 PM

## 2021-07-11 NOTE — Progress Notes (Signed)
PROGRESS NOTE  Trevor Ferguson    DOB: May 02, 1926, 87 y.o.  JSE:[id number]  PCP: [name], MD   Code Status: DNR   DOA: 06/21/2021   LOS: 42  Brief Narrative of Current Hospitalization  Trevor Ferguson is a 87 y.o. male with a PMH significant for COPD, HTN, HLD, sleep apnea, CKD 3B, CVA. They presented from SNF to the ED on 06/21/2021 with abdominal pain x a few days. In the ED, it was found that they had small bowel diverticulitis with possible microperforation.  General surgery was consulted. 11/4 patient underwent exploratory laparotomy with small bowel resection 11/6 postoperative ileus 11/8 having bowel movements, 3 L O2 requirement, increased lethargy 11/10 O2 requirement 0-7P, metabolic acidosis improving with bicarb gtt, unable to tolerate PO 11/12 mental status improved. Metabolic acidosis resolved. Able to dc bicarb drip 07/11/21 -patient remains inpatient due to continued increased oxygen demand, however, he maintains that he is completely asymptomatic and feels well.  Assessment & Plan  Active Problems:   COPD with acute exacerbation (HCC)   Acute diverticulitis   AKI (acute kidney injury) ([place])   Diverticulitis   Hypoxia   Perforation of intestine due to diverticulitis of gastrointestinal tract   HAP (hospital-acquired pneumonia)   Abdominal distension (gaseous)   Pressure injury of skin   Dyspnea   Acute on chronic respiratory failure with hypoxia (HCC)   Acute diastolic congestive heart failure ([place])  Acute small bowel diverticulitis- s/p resection 71/0 complicated by postoperative ileus.  Now having diarrhea, suspect related to Abx use. Tolerating soft diet without abdominal pain or nausea. -Surgery following, appreciate recommendations -tolerating diet well. Continue soft diet until OP surgery follow up -Zosyn per general surgery (11/8-11/10), flagyl (11/4-11/8) - gentle bowel regimen, imodium PRN   Acute hypoxic respiratory failure 2/2 atelectasis  versus early pneumonia  COPD - Patient endorses using breathing treatment and on 2L O2 at baseline. Will likely need new higher requirement on discharge. Repeat chest xray 11/21 showed signs consistent with fluid overload. He was started on a lasix drip.  - completed HAP treatment             - azithromycin (11/9-11/14)             - CTX (11/12-11/16) -DuoNebs twice daily -Robinul twice daily - wean to 2L as tolerated - PT/OT   Encephalopathy- resolved. Conversant and oriented today. - palliative consulted.              - meeting with family to discuss [place] 11/11- continue current course of treatment and dc with hospice/palliative to facility - continue PT/OT - attempt to get out of bed and limit night time interference to avoid delirium.    AKI on CKD IIIb  metabolic acidosis (resolved)  mild hypokalemia (resolved)- resolved. Cr 3.7>>1.5>1.92 -Nephrology following, appreciate recommendations -Monitor serum bicarb/electrolytes/Cr daily, BMP am - replete electrolytes PRN   Primary HTN-well-controlled with baseline low diastolic readings -Continue on Norvasc- increased to 10mg  -Holding home ACE inhibitor due to kidney function   Depression -Continue home Lexapro   GERD -Continue home PPI  Chronic iron deficiency anemia -Continue home iron  DVT prophylaxis: Place and maintain sequential compression device Start: 07/04/21 1447 heparin injection 5,000 Units Start: 06/27/21 0600   Diet:  Diet Orders (From admission, onward)     Start     Ordered   07/05/21 0000  Diet - low sodium heart healthy        07/05/21 1257   06/30/21  1443  DIET SOFT Room service appropriate? Yes with Assist; Fluid consistency: Thin  Diet effective now       Comments: Please CUT the meats and add Gravy;  NO STRAWS!!! CREAM SOUPS ONLY  Question Answer Comment  Room service appropriate? Yes with Assist   Fluid consistency: Thin      06/30/21 1443            Subjective 07/11/21    Pt reports  no complaints today. His breathing feels good and he would like to be discharged.   Disposition Plan & Communication  Patient status: Inpatient  Admitted From: SNF Disposition: Skilled nursing facility Anticipated discharge date: TBD, when can tolerate O2 stabilization arounf 5L  Family Communication: daughter  Consults, Procedures, Significant Events  Consultants:  Nephrology General surgery Palliative    Procedures/significant events:  Small bowel resection 11/4  Antimicrobials:  Anti-infectives (From admission, onward)    Start     Dose/Rate Route Frequency Ordered Stop   06/30/21 1300  cefTRIAXone (ROCEPHIN) 2 g in sodium chloride 0.9 % 100 mL IVPB        2 g 200 mL/hr over 30 Minutes Intravenous Every 24 hours 06/30/21 1205 07/03/21 1615   06/27/21 1400  azithromycin (ZITHROMAX) 250 mg in dextrose 5 % 125 mL IVPB        250 mg 125 mL/hr over 60 Minutes Intravenous Every 24 hours 06/27/21 1239 07/01/21 1759   06/26/21 1800  piperacillin-tazobactam (ZOSYN) IVPB 2.25 g  Status:  Discontinued        2.25 g 100 mL/hr over 30 Minutes Intravenous Every 6 hours 06/26/21 1604 06/28/21 0748   06/22/21 2100  ceFEPIme (MAXIPIME) 2 g in sodium chloride 0.9 % 100 mL IVPB  Status:  Discontinued        2 g 200 mL/hr over 30 Minutes Intravenous Every 24 hours 06/21/21 2331 06/26/21 1604   06/22/21 0600  metroNIDAZOLE (FLAGYL) IVPB 500 mg  Status:  Discontinued        500 mg 100 mL/hr over 60 Minutes Intravenous Every 8 hours 06/21/21 2331 06/26/21 1604   06/21/21 2100  ceFEPIme (MAXIPIME) 2 g in sodium chloride 0.9 % 100 mL IVPB        2 g 200 mL/hr over 30 Minutes Intravenous  Once 06/21/21 2050 06/21/21 2130   06/21/21 2100  metroNIDAZOLE (FLAGYL) IVPB 500 mg        500 mg 100 mL/hr over 60 Minutes Intravenous  Once 06/21/21 2050 06/22/21 1616       Objective   Vitals:   07/11/21 0138 07/11/21 0340 07/11/21 0357 07/11/21 0429  BP:  137/90 (!) 153/62   Pulse:  (!) 59 77    Resp:  14 18   Temp:  (!) 96.8 F (36 C) (!) 97.5 F (36.4 C)   TempSrc:   Oral   SpO2: 92% (!) 88% 97% 97%  Weight:      Height:        Intake/Output Summary (Last 24 hours) at 07/11/2021 0708 Last data filed at 07/11/2021 0250 Gross per 24 hour  Intake 760.46 ml  Output 2450 ml  Net -1689.54 ml   Filed Weights   06/22/21 2134 06/26/21 1549 06/27/21 0458  Weight: 96.2 kg 103.6 kg 104.4 kg    Patient BMI: Body mass index is 34.49 kg/m.   Physical Exam: General: awake, alert, NAD HEENT: atraumatic, clear conjunctiva, anicteric sclera, moist mucus membranes, hard of hearing Respiratory: normal respiratory effort. Cardiovascular: normal S1/S2,  RRR, no JVD, murmurs, rubs, gallops, quick capillary refill  Gastrointestinal: soft, NT, ND, no HSM felt Nervous: A&O x3. no gross focal neurologic deficits, normal speech Extremities: moves all equally, no edema, normal tone Skin: dry, intact, normal temperature, normal color, No rashes, lesions or ulcers Psychiatry: normal mood, congruent affect  Labs   CBC    Component Value Date/Time   WBC 9.0 07/11/2021 0355   RBC 2.92 (L) 07/11/2021 0355   HGB 9.0 (L) 07/11/2021 0355   HGB 12.8 (L) 03/19/2014 1049   HCT 27.0 (L) 07/11/2021 0355   HCT 37.7 (L) 03/19/2014 1049   PLT 200 07/11/2021 0355   PLT 137 (L) 03/19/2014 1049   MCV 92.5 07/11/2021 0355   MCV 97 03/19/2014 1049   MCH 30.8 07/11/2021 0355   MCHC 33.3 07/11/2021 0355   RDW 14.8 07/11/2021 0355   RDW 13.2 03/19/2014 1049   LYMPHSABS 0.5 (L) 06/27/2021 0334   MONOABS 1.6 (H) 06/27/2021 0334   EOSABS 0.1 06/27/2021 0334   BASOSABS 0.1 06/27/2021 0334   BMP Latest Ref Rng & Units 07/10/2021 07/09/2021 07/08/2021  Glucose 70 - 99 mg/dL 184(H) 191(H) 164(H)  BUN 8 - 23 mg/dL 91(H) 85(H) 82(H)  Creatinine 0.61 - 1.24 mg/dL 1.92(H) 2.14(H) 2.01(H)  Sodium 135 - 145 mmol/L 140 139 139  Potassium 3.5 - 5.1 mmol/L 4.3 4.6 4.3  Chloride 98 - 111 mmol/L 100 102 100  CO2  22 - 32 mmol/L 31 29 29   Calcium 8.9 - 10.3 mg/dL 8.3(L) 8.5(L) 8.8(L)    Imaging Studies  No results found. Medications   Scheduled Meds:  acetylcysteine  4 mL Nebulization BID   acidophilus  2 capsule Oral TID   amLODipine  10 mg Oral Daily   benzonatate  200 mg Oral TID   budesonide (PULMICORT) nebulizer solution  0.5 mg Nebulization BID   chlorpheniramine-HYDROcodone  5 mL Oral Q12H   cholecalciferol  1,000 Units Oral Daily   escitalopram  10 mg Oral Daily   famotidine  10 mg Oral BID   feeding supplement  237 mL Oral TID BM   ferrous sulfate  325 mg Oral Q breakfast   fluticasone  2 spray Each Nare Daily   guaiFENesin  1,200 mg Oral BID   heparin injection (subcutaneous)  5,000 Units Subcutaneous Q8H   ipratropium-albuterol  3 mL Nebulization Q6H   losartan  100 mg Oral Daily   mouth rinse  15 mL Mouth Rinse BID   methylPREDNISolone (SOLU-MEDROL) injection  40 mg Intravenous Q12H   multivitamin with minerals  1 tablet Oral Daily   traZODone  150 mg Oral QHS   No recently discontinued medications to reconcile  LOS: 20 days   Time spent: >60min  Emarie Paul L Bethene Hankinson, DO [facility] 07/11/2021, 7:08 AM   Please refer to amion to contact the [facility] Attending or Consulting provider for this pt  [email or web address] Available by Epic secure chat 7AM-7PM. If 7PM-7AM, please contact night-coverage

## 2021-07-12 ENCOUNTER — Inpatient Hospital Stay: Payer: Medicare PPO

## 2021-07-12 LAB — BASIC METABOLIC PANEL
Anion gap: 9 (ref 5–15)
BUN: 79 mg/dL — ABNORMAL HIGH (ref 8–23)
CO2: 36 mmol/L — ABNORMAL HIGH (ref 22–32)
Calcium: 8.8 mg/dL — ABNORMAL LOW (ref 8.9–10.3)
Chloride: 95 mmol/L — ABNORMAL LOW (ref 98–111)
Creatinine, Ser: 2.05 mg/dL — ABNORMAL HIGH (ref 0.61–1.24)
GFR, Estimated: 30 mL/min — ABNORMAL LOW (ref >60)
Glucose, Bld: 133 mg/dL — ABNORMAL HIGH (ref 70–99)
Potassium: 3.6 mmol/L (ref 3.5–5.1)
Sodium: 140 mmol/L (ref 135–145)

## 2021-07-12 MED ORDER — UMECLIDINIUM-VILANTEROL 62.5-25 MCG/ACT IN AEPB
2.0000 | INHALATION_SPRAY | Freq: Every day | RESPIRATORY_TRACT | Status: DC
  Administered 2021-07-12 – 2021-07-18 (×7): 2 via RESPIRATORY_TRACT
  Filled 2021-07-12 (×2): qty 14

## 2021-07-12 NOTE — Progress Notes (Signed)
[facility]  PROGRESS NOTE   Subjective:   Patient resting in the bed quietly.  Oxygen requirements have gone up overnight to 9 L now.  Urine output of 4130 cc on Lasix drip running at 6 mg/h Lower extremity edema appears to have improved    Objective:  Vital signs in last 24 hours:  Temp:  [97.7 F (36.5 C)-98.4 F (36.9 C)] 98.2 F (36.8 C) (11/24 0742) Pulse Rate:  [87-97] 97 (11/24 0742) Resp:  [17-20] 20 (11/24 0742) BP: (123-151)/(60-76) 151/76 (11/24 0742) SpO2:  [93 %-98 %] 98 % (11/24 0742)  Weight change:  Filed Weights   06/22/21 2134 06/26/21 1549 06/27/21 0458  Weight: 96.2 kg 103.6 kg 104.4 kg    Intake/Output: I/O last 3 completed shifts: In: 45 [P.O.:720] Out: 5680 [Urine:5680]   Intake/Output this shift:  Total I/O In: -  Out: 800 [Urine:800]  Physical Exam: General:  No acute distress, resting in bed  Head:  Normocephalic, atraumatic. Moist oral mucosal membranes  Eyes:  Anicteric  Lungs:    decreased breath sounds, mild rhonchi/crackles at bases, O2 7L  Heart:  regular  Abdomen:   +midline incision with staples  Extremities:  1+ peripheral edema.  Neurologic:  Awake, alert, following commands  Skin:  No lesions       Basic Metabolic Panel: Recent Labs  Lab 07/07/21 0412 07/08/21 0535 07/09/21 0524 07/10/21 0805 07/12/21 0639  NA 139 139 139 140 140  K 4.8 4.3 4.6 4.3 3.6  CL 104 100 102 100 95*  CO2 22 29 29 31  36*  GLUCOSE 162* 164* 191* 184* 133*  BUN 59* 82* 85* 91* 79*  CREATININE 1.96* 2.01* 2.14* 1.92* 2.05*  CALCIUM 8.4* 8.8* 8.5* 8.3* 8.8*     CBC: Recent Labs  Lab 07/07/21 0412 07/08/21 0535 07/11/21 0355  WBC 13.1*  --  9.0  HGB 7.8* 8.4* 9.0*  HCT 24.4*  --  27.0*  MCV 97.6  --  92.5  PLT 171  --  200      Urinalysis: No results for input(s): COLORURINE, LABSPEC, PHURINE, GLUCOSEU, HGBUR, BILIRUBINUR, KETONESUR, PROTEINUR, UROBILINOGEN, NITRITE, LEUKOCYTESUR in the last 72  hours.  Invalid input(s): APPERANCEUR    Imaging: No results found.   Medications:    sodium chloride 10 mL/hr at 06/26/21 1905   furosemide (LASIX) 200 mg in dextrose 5% 100 mL (2mg /mL) infusion 6 mg/hr (07/11/21 1140)    acetylcysteine  4 mL Nebulization BID   acidophilus  2 capsule Oral TID   amLODipine  10 mg Oral Daily   benzonatate  200 mg Oral TID   budesonide (PULMICORT) nebulizer solution  0.5 mg Nebulization BID   chlorpheniramine-HYDROcodone  5 mL Oral Q12H   cholecalciferol  1,000 Units Oral Daily   escitalopram  10 mg Oral Daily   famotidine  10 mg Oral BID   feeding supplement  237 mL Oral TID BM   ferrous sulfate  325 mg Oral Q breakfast   fluticasone  2 spray Each Nare Daily   guaiFENesin  1,200 mg Oral BID   heparin injection (subcutaneous)  5,000 Units Subcutaneous Q8H   ipratropium-albuterol  3 mL Nebulization Q6H   losartan  100 mg Oral Daily   mouth rinse  15 mL Mouth Rinse BID   multivitamin with minerals  1 tablet Oral Daily   traZODone  150 mg Oral QHS    Assessment/ Plan:     Active Problems:   COPD with acute exacerbation (  [place])   Acute diverticulitis   AKI (acute kidney injury) ([place])   Diverticulitis   Hypoxia   Perforation of intestine due to diverticulitis of gastrointestinal tract   HAP (hospital-acquired pneumonia)   Abdominal distension (gaseous)   Pressure injury of skin   Dyspnea   Acute on chronic respiratory failure with hypoxia (HCC)   Acute diastolic congestive heart failure [facility])  Trevor Ferguson is a 87 y.o. white male with hypertension, coronary artery disease, hyperlipidemia, COPD, obstructive sleep apnea, who is admitted to [facility] on 06/21/2021 for Diverticulitis [K57.92] AKI (acute kidney injury) ([place]) [N17.9] Acute diverticulitis [K57.92] Perforation of intestine due to diverticulitis of gastrointestinal tract [K57.80]  Patient underwent Exlap with bowel resection on 06/22/2021.   Acute kidney injury on chronic  kidney disease stage IIIB with proteinuria: baseline creatinine of 1.5, GFR of 43 on 07/05/21. Acute kidney injury secondary to prerenal azotemia and ATN. Nonoliguric urine output.  No indication for dialysis.   -Creatinine fluctuating with aggressive diuresis  - Monitoring renal function closely while on furosemide drip  Anemia of chronic kidney disease: hemoglobin 9.0, normocytic. No indication for ESA. History of iron deficiency.   - Continue ferrous sulfate.   Hypertension with chronic kidney disease:  - Continue losartan and amlodipine    4.  Lower extremity edema.    Patient responding well.  Fluid status appears to have improved Decrease drip to 4 mg/h.   LOS: [facility] 11/24/20229:02 AM

## 2021-07-12 NOTE — Progress Notes (Signed)
Patient ID: Trevor Ferguson, male   DOB: 1926-02-04, 87 y.o.   MRN: [id number]     [place] Hospital Day(s): 21.   Interval History: Patient seen and examined, no acute events or new complaints overnight. Patient reports he has been having intermittent shortness of breath.  Denies any nausea or vomiting.  Endorses having bowel movements.  Denies any issues with the wound.  This morning I was notified by the nurse that the patient has small wound dehiscence and drainage.  Requested for further evaluation.  Vital signs in last 24 hours: [min-max] current  Temp:  [97.7 F (36.5 C)-98.4 F (36.9 C)] 98.2 F (36.8 C) (11/24 0742) Pulse Rate:  [87-97] 97 (11/24 0742) Resp:  [17-20] 20 (11/24 0742) BP: (123-151)/(60-76) 151/76 (11/24 0742) SpO2:  [91 %-98 %] 98 % (11/24 0742)     Height: 5' 8.5" (174 cm) Weight: 104.4 kg BMI (Calculated): 34.48   Physical Exam:  Constitutional: alert, cooperative and no distress  Respiratory: breathing non-labored at rest  Cardiovascular: regular rate and sinus rhythm  Gastrointestinal: soft, non-tender, and non-distended.  The wound is dry and clean.  There is a very teeny tiny separation of the skin at the lower edge.  No cellulitis.  No drainage.  Dry.  Labs:  CBC Latest Ref Rng & Units 07/11/2021 07/08/2021 07/07/2021  WBC 4.0 - 10.5 K/uL 9.0 - 13.1(H)  Hemoglobin 13.0 - 17.0 g/dL 9.0(L) 8.4(L) 7.8(L)  Hematocrit 39.0 - 52.0 % 27.0(L) - 24.4(L)  Platelets 150 - 400 K/uL 200 - 171   CMP Latest Ref Rng & Units 07/12/2021 07/10/2021 07/09/2021  Glucose 70 - 99 mg/dL 133(H) 184(H) 191(H)  BUN 8 - 23 mg/dL 79(H) 91(H) 85(H)  Creatinine 0.61 - 1.24 mg/dL 2.05(H) 1.92(H) 2.14(H)  Sodium 135 - 145 mmol/L 140 140 139  Potassium 3.5 - 5.1 mmol/L 3.6 4.3 4.6  Chloride 98 - 111 mmol/L 95(L) 100 102  CO2 22 - 32 mmol/L 36(H) 31 29  Calcium 8.9 - 10.3 mg/dL 8.8(L) 8.3(L) 8.5(L)  Total Protein 6.5 - 8.1 g/dL - - -  Total Bilirubin 0.3 - 1.2  mg/dL - - -  Alkaline Phos 38 - 126 U/L - - -  AST 15 - 41 U/L - - -  ALT 0 - 44 U/L - - -    Imaging studies: No new pertinent imaging studies   Assessment/Plan:  87 y.o. male with diverticulitis of small intestine with perforation 20 Day Post-Op s/p small bowel resection and anastomosis, complicated by pertinent comorbidities including COPD, hypertension, stage IIIb chronic kidney disease, history of CVA, sleep apnea.  I personally did a wound check.  The lower edge of the wound is minimally separated from the skin but there is no defect.  There is no drainage.  There is no cellulitis.  No sign of infection.  Patient tolerating diet in small amounts.  He was again oriented to the small meals more frequently.  Patient has intermittent having issues with COPD shortness of breath.  He has been very difficult to wean down oxygen.  Appreciate hospitalist management of medical comorbidities.  I will continue to follow while patient is in the hospital.  Arnold Long, MD

## 2021-07-12 NOTE — Progress Notes (Signed)
PROGRESS NOTE  Trevor Ferguson    DOB: 09/22/1925, 87 y.o.  ZOX:[id number]  PCP: [name], MD   Code Status: DNR   DOA: 06/21/2021   LOS: 21  Brief Narrative of Current Hospitalization  Trevor Ferguson is a 87 y.o. male with a PMH significant for COPD, HTN, HLD, sleep apnea, CKD 3B, CVA. They presented from SNF to the ED on 06/21/2021 with abdominal pain x a few days. In the ED, it was found that they had small bowel diverticulitis with possible microperforation.  General surgery was consulted. 11/4 patient underwent exploratory laparotomy with small bowel resection 11/6 postoperative ileus 11/8 having bowel movements, 3 L O2 requirement, increased lethargy 11/10 O2 requirement 8-1X, metabolic acidosis improving with bicarb gtt, unable to tolerate PO 11/12 mental status improved. Metabolic acidosis resolved. Able to dc bicarb drip 07/12/21 -patient remains inpatient due to continued increased oxygen demand, however, he maintains that he is completely asymptomatic and feels well.  Assessment & Plan  Active Problems:   COPD with acute exacerbation (HCC)   Acute diverticulitis   AKI (acute kidney injury) ([place])   Diverticulitis   Hypoxia   Perforation of intestine due to diverticulitis of gastrointestinal tract   HAP (hospital-acquired pneumonia)   Abdominal distension (gaseous)   Pressure injury of skin   Dyspnea   Acute on chronic respiratory failure with hypoxia (HCC)   Acute diastolic congestive heart failure ([place])  Acute small bowel diverticulitis- s/p resection 91/4 complicated by postoperative ileus.  Now having normal Bms. Tolerating soft diet without abdominal pain or nausea. -Surgery following, appreciate recommendations -tolerating diet well. Continue soft diet until OP surgery follow up -Zosyn per general surgery (11/8-11/10), flagyl (11/4-11/8) - gentle bowel regimen, imodium PRN   Acute hypoxic respiratory failure 2/2 atelectasis versus early pneumonia  COPD -  Patient endorses using breathing treatment and on 2L O2 at baseline. Will likely need new higher requirement on discharge. Repeat chest xray 11/21 showed signs consistent with fluid overload. He was started on a lasix drip.  - completed HAP treatment             - azithromycin (11/9-11/14)             - CTX (11/12-11/16) - anoro daily - pulmicort BID - wean to 2-5L as tolerated - PT/OT - flonase daily - consult pulmonology, per patient's daughter request - repeat chest xray tomorrow if not improving.    Encephalopathy- resolved. Conversant and oriented today. - palliative consulted.              - meeting with family to discuss [place] 11/11- continue current course of treatment and dc with hospice/palliative to facility - continue PT/OT - attempt to get out of bed and limit night time interference to avoid delirium.    AKI on CKD IIIb  metabolic acidosis (resolved)  mild hypokalemia (resolved)- resolved. Cr 3.7>>1.5>1.92>2.05 Worsening likely due to the diuresis for his pulmonary edema -Nephrology following, appreciate recommendations -Monitor serum bicarb/electrolytes/Cr daily, BMP am - replete electrolytes PRN   Primary HTN-well-controlled with baseline low diastolic readings -discontinuing amlodipine today to help prevent vascular leaking -Holding home ACE inhibitor due to kidney function   Depression -Continue home Lexapro   GERD -Continue home PPI  Chronic iron deficiency anemia -Continue home iron  DVT prophylaxis: Place and maintain sequential compression device Start: 07/04/21 1447 heparin injection 5,000 Units Start: 06/27/21 0600   Diet:  Diet Orders (From admission, onward)     Start  Ordered   07/05/21 0000  Diet - low sodium heart healthy        07/05/21 1257   06/30/21 1443  DIET SOFT Room service appropriate? Yes with Assist; Fluid consistency: Thin  Diet effective now       Comments: Please CUT the meats and add Gravy;  NO STRAWS!!! CREAM SOUPS ONLY   Question Answer Comment  Room service appropriate? Yes with Assist   Fluid consistency: Thin      06/30/21 1443            Subjective 07/12/21    Pt reports no complaints today. He would like to leave the hospital.  Disposition Plan & Communication  Patient status: Inpatient  Admitted From: SNF Disposition: Skilled nursing facility Anticipated discharge date: TBD, when can tolerate O2 stabilization around 5L for the SNF requirements  Family Communication: daughter via telephone Consults, Procedures, Significant Events  Consultants:  Nephrology General surgery Palliative  Pulmonology    Procedures/significant events:  Small bowel resection 11/4  Antimicrobials:  Anti-infectives (From admission, onward)    Start     Dose/Rate Route Frequency Ordered Stop   06/30/21 1300  cefTRIAXone (ROCEPHIN) 2 g in sodium chloride 0.9 % 100 mL IVPB        2 g 200 mL/hr over 30 Minutes Intravenous Every 24 hours 06/30/21 1205 07/03/21 1615   06/27/21 1400  azithromycin (ZITHROMAX) 250 mg in dextrose 5 % 125 mL IVPB        250 mg 125 mL/hr over 60 Minutes Intravenous Every 24 hours 06/27/21 1239 07/01/21 1759   06/26/21 1800  piperacillin-tazobactam (ZOSYN) IVPB 2.25 g  Status:  Discontinued        2.25 g 100 mL/hr over 30 Minutes Intravenous Every 6 hours 06/26/21 1604 06/28/21 0748   06/22/21 2100  ceFEPIme (MAXIPIME) 2 g in sodium chloride 0.9 % 100 mL IVPB  Status:  Discontinued        2 g 200 mL/hr over 30 Minutes Intravenous Every 24 hours 06/21/21 2331 06/26/21 1604   06/22/21 0600  metroNIDAZOLE (FLAGYL) IVPB 500 mg  Status:  Discontinued        500 mg 100 mL/hr over 60 Minutes Intravenous Every 8 hours 06/21/21 2331 06/26/21 1604   06/21/21 2100  ceFEPIme (MAXIPIME) 2 g in sodium chloride 0.9 % 100 mL IVPB        2 g 200 mL/hr over 30 Minutes Intravenous  Once 06/21/21 2050 06/21/21 2130   06/21/21 2100  metroNIDAZOLE (FLAGYL) IVPB 500 mg        500 mg 100 mL/hr over  60 Minutes Intravenous  Once 06/21/21 2050 06/22/21 1616       Objective   Vitals:   07/11/21 2003 07/12/21 0047 07/12/21 0230 07/12/21 0435  BP: 135/62 123/60  140/63  Pulse: 93 89  90  Resp: 18 18  20   Temp: 97.9 F (36.6 C) 97.7 F (36.5 C)  98.4 F (36.9 C)  TempSrc:      SpO2: 98% 95% 96% 96%  Weight:      Height:        Intake/Output Summary (Last 24 hours) at 07/12/2021 0716 Last data filed at 07/12/2021 0550 Gross per 24 hour  Intake 720 ml  Output 4130 ml  Net -3410 ml    Filed Weights   06/22/21 2134 06/26/21 1549 06/27/21 0458  Weight: 96.2 kg 103.6 kg 104.4 kg    Patient BMI: Body mass index is 34.49 kg/m.  Physical Exam: General: awake, alert, NAD HEENT: atraumatic, clear conjunctiva, anicteric sclera, moist mucus membranes, hard of hearing Respiratory: normal respiratory effort. Stertor. Lung fields clear. Cardiovascular: normal S1/S2,  RRR, no JVD, murmurs, rubs, gallops, quick capillary refill  Gastrointestinal: soft, NT, ND, no HSM felt Nervous: A&O x3. no gross focal neurologic deficits, normal speech Extremities: moves all equally, no edema, normal tone Skin: dry, intact, normal temperature, normal color, No rashes, lesions or ulcers Psychiatry: normal mood, congruent affect  Labs   CBC    Component Value Date/Time   WBC 9.0 07/11/2021 0355   RBC 2.92 (L) 07/11/2021 0355   HGB 9.0 (L) 07/11/2021 0355   HGB 12.8 (L) 03/19/2014 1049   HCT 27.0 (L) 07/11/2021 0355   HCT 37.7 (L) 03/19/2014 1049   PLT 200 07/11/2021 0355   PLT 137 (L) 03/19/2014 1049   MCV 92.5 07/11/2021 0355   MCV 97 03/19/2014 1049   MCH 30.8 07/11/2021 0355   MCHC 33.3 07/11/2021 0355   RDW 14.8 07/11/2021 0355   RDW 13.2 03/19/2014 1049   LYMPHSABS 0.5 (L) 06/27/2021 0334   MONOABS 1.6 (H) 06/27/2021 0334   EOSABS 0.1 06/27/2021 0334   BASOSABS 0.1 06/27/2021 0334   BMP Latest Ref Rng & Units 07/10/2021 07/09/2021 07/08/2021  Glucose 70 - 99 mg/dL 184(H)  191(H) 164(H)  BUN 8 - 23 mg/dL 91(H) 85(H) 82(H)  Creatinine 0.61 - 1.24 mg/dL 1.92(H) 2.14(H) 2.01(H)  Sodium 135 - 145 mmol/L 140 139 139  Potassium 3.5 - 5.1 mmol/L 4.3 4.6 4.3  Chloride 98 - 111 mmol/L 100 102 100  CO2 22 - 32 mmol/L 31 29 29   Calcium 8.9 - 10.3 mg/dL 8.3(L) 8.5(L) 8.8(L)    Imaging Studies  No results found. Medications   Scheduled Meds:  acetylcysteine  4 mL Nebulization BID   acidophilus  2 capsule Oral TID   amLODipine  10 mg Oral Daily   benzonatate  200 mg Oral TID   budesonide (PULMICORT) nebulizer solution  0.5 mg Nebulization BID   chlorpheniramine-HYDROcodone  5 mL Oral Q12H   cholecalciferol  1,000 Units Oral Daily   escitalopram  10 mg Oral Daily   famotidine  10 mg Oral BID   feeding supplement  237 mL Oral TID BM   ferrous sulfate  325 mg Oral Q breakfast   fluticasone  2 spray Each Nare Daily   guaiFENesin  1,200 mg Oral BID   heparin injection (subcutaneous)  5,000 Units Subcutaneous Q8H   ipratropium-albuterol  3 mL Nebulization Q6H   losartan  100 mg Oral Daily   mouth rinse  15 mL Mouth Rinse BID   multivitamin with minerals  1 tablet Oral Daily   traZODone  150 mg Oral QHS   No recently discontinued medications to reconcile  LOS: 21 days   Time spent: >62min  Amzie Sillas L Yousra Ivens, DO [facility] 07/12/2021, 7:16 AM   Please refer to amion to contact the [facility] Attending or Consulting provider for this pt  [email or web address] Available by Epic secure chat 7AM-7PM. If 7PM-7AM, please contact night-coverage

## 2021-07-12 NOTE — TOC Progression Note (Signed)
Transition of Care [facility]) - Progression Note    Patient Details  Name: Trevor Ferguson MRN: [id number] Date of Birth: Jan 08, 1926  Transition of Care [facility]) CM/SW [place], RN Phone Number: 07/12/2021, 12:00 PM  Clinical Narrative:    The patient remains on 9 Liters of O2, Not medically stable to DC at this time, Will return to [facility] once down to 5 liters    Expected Discharge Plan: [place] Barriers to Discharge: Continued Medical Work up  Expected Discharge Plan and Services Expected Discharge Plan: [place]       Living arrangements for the past 2 months: [place] Expected Discharge Date: 07/05/21                                     Social Determinants of Health (SDOH) Interventions    Readmission Risk Interventions Readmission Risk Prevention Plan 06/23/2021 01/29/2021  Transportation Screening Complete Complete  PCP or Specialist Appt within 3-5 Days Complete Complete  HRI or [place] Complete Complete  Social Work Consult for [place] Planning/Counseling Complete Complete  Palliative Care Screening Not Applicable Not Applicable  Medication Review [employer]) Complete Complete  Some recent data might be hidden

## 2021-07-13 DIAGNOSIS — L89150 Pressure ulcer of sacral region, unstageable: Secondary | ICD-10-CM

## 2021-07-13 LAB — CBC
HCT: 28.8 % — ABNORMAL LOW (ref 39.0–52.0)
Hemoglobin: 9.6 g/dL — ABNORMAL LOW (ref 13.0–17.0)
MCH: 31.2 pg (ref 26.0–34.0)
MCHC: 33.3 g/dL (ref 30.0–36.0)
MCV: 93.5 fL (ref 80.0–100.0)
Platelets: 213 10*3/uL (ref 150–400)
RBC: 3.08 MIL/uL — ABNORMAL LOW (ref 4.22–5.81)
RDW: 15.3 % (ref 11.5–15.5)
WBC: 17.5 10*3/uL — ABNORMAL HIGH (ref 4.0–10.5)
nRBC: 0 % (ref 0.0–0.2)

## 2021-07-13 LAB — BASIC METABOLIC PANEL
Anion gap: 7 (ref 5–15)
BUN: 74 mg/dL — ABNORMAL HIGH (ref 8–23)
CO2: 40 mmol/L — ABNORMAL HIGH (ref 22–32)
Calcium: 8.5 mg/dL — ABNORMAL LOW (ref 8.9–10.3)
Chloride: 95 mmol/L — ABNORMAL LOW (ref 98–111)
Creatinine, Ser: 1.98 mg/dL — ABNORMAL HIGH (ref 0.61–1.24)
GFR, Estimated: 31 mL/min — ABNORMAL LOW (ref >60)
Glucose, Bld: 185 mg/dL — ABNORMAL HIGH (ref 70–99)
Potassium: 3.4 mmol/L — ABNORMAL LOW (ref 3.5–5.1)
Sodium: 142 mmol/L (ref 135–145)

## 2021-07-13 MED ORDER — POTASSIUM CHLORIDE CRYS ER 20 MEQ PO TBCR
40.0000 meq | EXTENDED_RELEASE_TABLET | Freq: Once | ORAL | Status: AC
  Administered 2021-07-13: 40 meq via ORAL
  Filled 2021-07-13: qty 4

## 2021-07-13 MED ORDER — ACETAZOLAMIDE 250 MG PO TABS
500.0000 mg | ORAL_TABLET | Freq: Once | ORAL | Status: AC
  Administered 2021-07-13: 500 mg via ORAL
  Filled 2021-07-13: qty 2

## 2021-07-13 NOTE — Progress Notes (Signed)
Physical Therapy Treatment Patient Details Name: Trevor Ferguson MRN: [id number] DOB: 11-16-1925 Today's Date: 07/13/2021   History of Present Illness Patient is a 87 year old male with PMH of COPD, prostate cancer, HTN, sleep apnea, stage IIIB CKD and CVA. He presented to ER with acute onset of R lower quadrant abdominal pain with nausea and dry heaves.  Patient underwent exploratory laparotomy with small bowl resection on 06/22/21. Patient fell on 06/23/21 while in hospital. Patient came from [facility] SNF    PT Comments    Pt is making gradual progress towards goals. Pt sleeping upon arrival, however easily awakens and reports he would like to transfer to recliner for lunch. Pt on 8L of HFNC with sats easily decreasing to 79% with transfers to recliner. Cues for pursed lip breathing with able to improve to 83% quickly and then 88%. WIll continue to progress as able.  Recommendations for follow up therapy are one component of a multi-disciplinary discharge planning process, led by the attending physician.  Recommendations may be updated based on patient status, additional functional criteria and insurance authorization.  Follow Up Recommendations  Skilled nursing-short term rehab (<3 hours/day)     Assistance Recommended at Discharge Frequent or constant Supervision/Assistance  Equipment Recommendations  None recommended by PT    Recommendations for Other Services       Precautions / Restrictions Precautions Precautions: Fall Restrictions Weight Bearing Restrictions: No     Mobility  Bed Mobility Overal bed mobility: Needs Assistance Bed Mobility: Supine to Sit     Supine to sit: Min guard     General bed mobility comments: safe technique with decreased cues for sequencing. Once seated, upright posture noted. All mobility performed on 8L of HFNC    Transfers Overall transfer level: Needs assistance Equipment used: Rolling Guillen (2 wheels) Transfers: Sit to/from  Stand Sit to Stand: Min assist     Step pivot transfers: Min assist     General transfer comment: needs rocking momentum to stand. Once standing, braces back of legs against bed surface due to weakness and impaired balance. Able to transfer to recliner with min assist    Ambulation/Gait               General Gait Details: deferred due to O2 sats   Stairs             Wheelchair Mobility    Modified Rankin (Stroke Patients Only)       Balance Overall balance assessment: Needs assistance Sitting-balance support: Feet supported Sitting balance-Leahy Scale: Good     Standing balance support: Reliant on assistive device for balance;During functional activity;Bilateral upper extremity supported Standing balance-Leahy Scale: Fair                              Cognition Arousal/Alertness: Awake/alert Behavior During Therapy: WFL for tasks assessed/performed Overall Cognitive Status: Within Functional Limits for tasks assessed                                          Exercises      General Comments        Pertinent Vitals/Pain Pain Assessment: No/denies pain    Home Living  Prior Function            PT Goals (current goals can now be found in the care plan section) Acute Rehab PT Goals Patient Stated Goal: to return back to SNF ( [facility]) PT Goal Formulation: With patient Time For Goal Achievement: 07/23/21 Potential to Achieve Goals: Good Progress towards PT goals: Progressing toward goals    Frequency    Min 2X/week      PT Plan Current plan remains appropriate    Co-evaluation              AM-PAC PT "6 Clicks" Mobility   Outcome Measure  Help needed turning from your back to your side while in a flat bed without using bedrails?: A Little Help needed moving from lying on your back to sitting on the side of a flat bed without using bedrails?: A Little Help  needed moving to and from a bed to a chair (including a wheelchair)?: A Little Help needed standing up from a chair using your arms (e.g., wheelchair or bedside chair)?: A Little Help needed to walk in hospital room?: A Little Help needed climbing 3-5 steps with a railing? : A Lot 6 Click Score: 17    End of Session Equipment Utilized During Treatment: Oxygen Activity Tolerance: Patient limited by fatigue Patient left: in chair;with chair alarm set Nurse Communication: Mobility status PT Visit Diagnosis: Unsteadiness on feet (R26.81);Other abnormalities of gait and mobility (R26.89);Repeated falls (R29.6);Muscle weakness (generalized) (M62.81);Difficulty in walking, not elsewhere classified (R26.2);History of falling (Z91.81);Pain     Time: 7619-5093 PT Time Calculation (min) (ACUTE ONLY): 14 min  Charges:  $Therapeutic Activity: 8-22 mins                     Greggory Stallion, PT, DPT [phone]    Kristia Jupiter 07/13/2021, 12:33 PM

## 2021-07-13 NOTE — Progress Notes (Signed)
[facility]  PROGRESS NOTE   Subjective:   Patient seen resting in bed, eating breakfast Denies nausea and vomiting States breathing has not improved, currently on 8 L high flow Edema improved on lower extremities Lasix drip at 4 mg/h UOP 2L recorded in 24 hours  Objective:  Vital signs in last 24 hours:  Temp:  [97 F (36.1 C)-98.9 F (37.2 C)] 98.8 F (37.1 C) (11/25 1106) Pulse Rate:  [63-107] 90 (11/25 1106) Resp:  [16-20] 18 (11/25 1106) BP: (128-162)/(51-92) 128/68 (11/25 1106) SpO2:  [86 %-98 %] 90 % (11/25 1106)  Weight change:  Filed Weights   06/22/21 2134 06/26/21 1549 06/27/21 0458  Weight: 96.2 kg 103.6 kg 104.4 kg    Intake/Output: I/O last 3 completed shifts: In: 135.1 [I.V.:135.1] Out: 0932 [Urine:4380]   Intake/Output this shift:  Total I/O In: 137.2 [P.O.:120; I.V.:17.2] Out: -   Physical Exam: General:  No acute distress, resting in bed  Head:  Normocephalic, atraumatic. Moist oral mucosal membranes  Eyes:  Anicteric  Lungs:    decreased breath sounds, mild rhonchi/crackles at bases, O2 8L  Heart:  regular  Abdomen:   +midline incision with staples  Extremities:  1+ peripheral edema.  Neurologic:  Awake, alert, following commands  Skin:  No lesions       Basic Metabolic Panel: Recent Labs  Lab 07/08/21 0535 07/09/21 0524 07/10/21 0805 07/12/21 0639 07/13/21 0447  NA 139 139 140 140 142  K 4.3 4.6 4.3 3.6 3.4*  CL 100 102 100 95* 95*  CO2 29 29 31  36* 40*  GLUCOSE 164* 191* 184* 133* 185*  BUN 82* 85* 91* 79* 74*  CREATININE 2.01* 2.14* 1.92* 2.05* 1.98*  CALCIUM 8.8* 8.5* 8.3* 8.8* 8.5*     CBC: Recent Labs  Lab 07/07/21 0412 07/08/21 0535 07/11/21 0355 07/13/21 0447  WBC 13.1*  --  9.0 17.5*  HGB 7.8* 8.4* 9.0* 9.6*  HCT 24.4*  --  27.0* 28.8*  MCV 97.6  --  92.5 93.5  PLT 171  --  200 213      Urinalysis: No results for input(s): COLORURINE, LABSPEC, PHURINE, GLUCOSEU, HGBUR, BILIRUBINUR,  KETONESUR, PROTEINUR, UROBILINOGEN, NITRITE, LEUKOCYTESUR in the last 72 hours.  Invalid input(s): APPERANCEUR    Imaging: DG Chest 2 View  Result Date: 07/12/2021 CLINICAL DATA:  Chest pain, cough, and shortness of breath. EXAM: CHEST - 2 VIEW COMPARISON:  Chest x-ray dated July 09, 2021. FINDINGS: Stable cardiomediastinal silhouette. Chronically coarsened interstitial markings. Unchanged mild bibasilar atelectasis and small bilateral pleural effusions. No pneumothorax. No acute osseous abnormality. IMPRESSION: 1. Unchanged mild bibasilar atelectasis and small bilateral pleural effusions. Electronically Signed   By: Titus Dubin M.D.   On: 07/12/2021 11:20     Medications:    sodium chloride 10 mL/hr at 06/26/21 1905   furosemide (LASIX) 200 mg in dextrose 5% 100 mL (2mg /mL) infusion 4 mg/hr (07/13/21 1201)    acetaZOLAMIDE  500 mg Oral Once   acetylcysteine  4 mL Nebulization BID   acidophilus  2 capsule Oral TID   benzonatate  200 mg Oral TID   budesonide (PULMICORT) nebulizer solution  0.5 mg Nebulization BID   chlorpheniramine-HYDROcodone  5 mL Oral Q12H   cholecalciferol  1,000 Units Oral Daily   escitalopram  10 mg Oral Daily   famotidine  10 mg Oral BID   feeding supplement  237 mL Oral TID BM   ferrous sulfate  325 mg Oral Q breakfast   fluticasone  2 spray Each Nare Daily   guaiFENesin  1,200 mg Oral BID   heparin injection (subcutaneous)  5,000 Units Subcutaneous Q8H   mouth rinse  15 mL Mouth Rinse BID   multivitamin with minerals  1 tablet Oral Daily   traZODone  150 mg Oral QHS   umeclidinium-vilanterol  2 puff Inhalation Daily    Assessment/ Plan:     Active Problems:   COPD with acute exacerbation ([place])   Acute diverticulitis   AKI (acute kidney injury) ([place])   Diverticulitis   Hypoxia   Perforation of intestine due to diverticulitis of gastrointestinal tract   HAP (hospital-acquired pneumonia)   Abdominal distension (gaseous)   Pressure injury  of skin   Dyspnea   Acute on chronic respiratory failure with hypoxia (HCC)   Acute diastolic congestive heart failure [facility])  Trevor Ferguson is a 87 y.o. white male with hypertension, coronary artery disease, hyperlipidemia, COPD, obstructive sleep apnea, who is admitted to [facility] on 06/21/2021 for Diverticulitis [K57.92] AKI (acute kidney injury) ([place]) [N17.9] Acute diverticulitis [K57.92] Perforation of intestine due to diverticulitis of gastrointestinal tract [K57.80]  Patient underwent Exlap with bowel resection on 06/22/2021.   Acute kidney injury on chronic kidney disease stage IIIB with proteinuria: baseline creatinine of 1.5, GFR of 43 on 07/05/21. Acute kidney injury secondary to prerenal azotemia and ATN. Nonoliguric urine output.  No indication for dialysis.   - Monitoring renal function closely while on furosemide drip   Anemia of chronic kidney disease: hemoglobin 9.6, normocytic. No indication for ESA. History of iron deficiency.   - Continue ferrous sulfate.   Hypertension with chronic kidney disease:  - Continue losartan and amlodipine    4.  Lower extremity edema.    Patient responding well with improved fluid status   LOS: [facility] 11/25/20221:51 PM

## 2021-07-13 NOTE — Plan of Care (Signed)

## 2021-07-13 NOTE — Progress Notes (Signed)
PROGRESS NOTE  Trevor Ferguson    DOB: [birth year], 87 y.o.  TZG:[id number]  PCP: [name], MD   Code Status: DNR   DOA: 06/21/2021   LOS: 34  Brief Narrative of Current Hospitalization  Trevor Ferguson is a 87 y.o. male with a PMH significant for COPD, HTN, HLD, sleep apnea, CKD 3B, CVA. They presented from SNF to the ED on 06/21/2021 with abdominal pain x a few days. In the ED, it was found that they had small bowel diverticulitis with possible microperforation.  General surgery was consulted. 11/4 patient underwent exploratory laparotomy with small bowel resection 11/6 postoperative ileus 11/8 having bowel movements, 3 L O2 requirement, increased lethargy 11/10 O2 requirement 7-5F, metabolic acidosis improving with bicarb gtt, unable to tolerate PO 11/12 mental status improved. Metabolic acidosis resolved. Able to dc bicarb drip 07/13/21 -bipap overnight  Assessment & Plan  Active Problems:   COPD with acute exacerbation (HCC)   Acute diverticulitis   AKI (acute kidney injury) ([place])   Diverticulitis   Hypoxia   Perforation of intestine due to diverticulitis of gastrointestinal tract   HAP (hospital-acquired pneumonia)   Abdominal distension (gaseous)   Pressure injury of skin   Dyspnea   Acute on chronic respiratory failure with hypoxia (HCC)   Acute diastolic congestive heart failure ([place])  Acute small bowel diverticulitis- s/p resection 16/3 complicated by postoperative ileus.  Now having normal Bms. Tolerating soft diet without abdominal pain or nausea. -Surgery following, appreciate recommendations -tolerating diet well. Continue soft diet until OP surgery follow up -Zosyn per general surgery (11/8-11/10), flagyl (11/4-11/8) - gentle bowel regimen, imodium PRN   Acute hypoxic respiratory failure 2/2 atelectasis versus early pneumonia  COPD - Patient endorses using breathing treatment and on 2L O2 at baseline. Will likely need new higher requirement on discharge.  Chest xray 11/21 showed signs consistent with fluid overload and unchanged on repeat cxray 11/24. He was started on a lasix drip conservatively to avoid worsening of kidney function.  Was on bipap overnight and had hypoxic delirium which has now resolved. WBC excessive jump from 9>17.5 in 2 days. - completed HAP treatment             - azithromycin (11/9-11/14)             - CTX (11/12-11/16) - anoro daily - pulmicort BID - wean to 2-5L as tolerated - PT/OT - flonase daily - consult pulmonology, per patient's daughter request   Encephalopathy- resolved. Conversant and oriented today. - palliative consulted.              - meeting with family to discuss [place] 11/11- continue current course of treatment and dc with hospice/palliative to facility - continue PT/OT - attempt to get out of bed and limit night time interference to avoid delirium.    AKI on CKD IIIb  metabolic acidosis (resolved)  mild hypokalemia (resolved)- resolved. Cr 3.7>>1.5>1.92>2.05>1.98. K+ 3.4 Improved with decreasing his lasix rate -Nephrology following, appreciate recommendations -Monitor serum bicarb/electrolytes/Cr daily, BMP am - replete electrolytes PRN   Primary HTN-well-controlled with baseline low diastolic readings -discontinuing amlodipine to help prevent vascular leaking -Holding home ACE inhibitor due to kidney function   Depression -Continue home Lexapro   GERD -Continue home PPI  Chronic iron deficiency anemia -Continue home iron  DVT prophylaxis: Place and maintain sequential compression device Start: 07/04/21 1447 heparin injection 5,000 Units Start: 06/27/21 0600   Diet:  Diet Orders (From admission, onward)  Start     Ordered   07/05/21 0000  Diet - low sodium heart healthy        07/05/21 1257   06/30/21 1443  DIET SOFT Room service appropriate? Yes with Assist; Fluid consistency: Thin  Diet effective now       Comments: Please CUT the meats and add Gravy;  NO STRAWS!!! CREAM  SOUPS ONLY  Question Answer Comment  Room service appropriate? Yes with Assist   Fluid consistency: Thin      06/30/21 1443            Subjective 07/13/21    Pt reports no complaints today other than feeling tired.  Disposition Plan & Communication  Patient status: Inpatient  Admitted From: SNF Disposition: Skilled nursing facility Anticipated discharge date: TBD, when can tolerate O2 stabilization around 5L for the SNF requirements  Family Communication: daughter via telephone Consults, Procedures, Significant Events  Consultants:  Nephrology General surgery Palliative  Pulmonology    Procedures/significant events:  Small bowel resection 11/4  Antimicrobials:  Anti-infectives (From admission, onward)    Start     Dose/Rate Route Frequency Ordered Stop   06/30/21 1300  cefTRIAXone (ROCEPHIN) 2 g in sodium chloride 0.9 % 100 mL IVPB        2 g 200 mL/hr over 30 Minutes Intravenous Every 24 hours 06/30/21 1205 07/03/21 1615   06/27/21 1400  azithromycin (ZITHROMAX) 250 mg in dextrose 5 % 125 mL IVPB        250 mg 125 mL/hr over 60 Minutes Intravenous Every 24 hours 06/27/21 1239 07/01/21 1759   06/26/21 1800  piperacillin-tazobactam (ZOSYN) IVPB 2.25 g  Status:  Discontinued        2.25 g 100 mL/hr over 30 Minutes Intravenous Every 6 hours 06/26/21 1604 06/28/21 0748   06/22/21 2100  ceFEPIme (MAXIPIME) 2 g in sodium chloride 0.9 % 100 mL IVPB  Status:  Discontinued        2 g 200 mL/hr over 30 Minutes Intravenous Every 24 hours 06/21/21 2331 06/26/21 1604   06/22/21 0600  metroNIDAZOLE (FLAGYL) IVPB 500 mg  Status:  Discontinued        500 mg 100 mL/hr over 60 Minutes Intravenous Every 8 hours 06/21/21 2331 06/26/21 1604   06/21/21 2100  ceFEPIme (MAXIPIME) 2 g in sodium chloride 0.9 % 100 mL IVPB        2 g 200 mL/hr over 30 Minutes Intravenous  Once 06/21/21 2050 06/21/21 2130   06/21/21 2100  metroNIDAZOLE (FLAGYL) IVPB 500 mg        500 mg 100 mL/hr over 60  Minutes Intravenous  Once 06/21/21 2050 06/22/21 1616       Objective   Vitals:   07/12/21 2022 07/12/21 2035 07/13/21 0003 07/13/21 0436  BP: (!) 140/92 (!) 162/82 (!) 134/51 (!) 131/56  Pulse: (!) 107 (!) 107 70 63  Resp:  20 20 16   Temp:  98.5 F (36.9 C) 98.9 F (37.2 C) (!) 97 F (36.1 C)  TempSrc:  Oral Axillary Axillary  SpO2: (!) 86% 98% 96% 98%  Weight:      Height:        Intake/Output Summary (Last 24 hours) at 07/13/2021 0718 Last data filed at 07/13/2021 0441 Gross per 24 hour  Intake 135.14 ml  Output 2000 ml  Net -1864.86 ml    Filed Weights   06/22/21 2134 06/26/21 1549 06/27/21 0458  Weight: 96.2 kg 103.6 kg 104.4 kg  Patient BMI: Body mass index is 34.49 kg/m.   Physical Exam: General: awake, alert, NAD HEENT: atraumatic, clear conjunctiva, anicteric sclera, moist mucus membranes, hard of hearing Respiratory: normal respiratory effort. Stertor. Lung fields clear. On bipap Cardiovascular: normal S1/S2,  RRR, no JVD, murmurs, rubs, gallops, quick capillary refill  Gastrointestinal: soft, NT, ND, no HSM felt Nervous: A&O x2. no gross focal neurologic deficits, normal speech Extremities: moves all equally, no edema, normal tone Skin: dry, intact, normal temperature, normal color, No rashes, lesions or ulcers Psychiatry: normal mood, congruent affect  Labs   CBC    Component Value Date/Time   WBC 17.5 (H) 07/13/2021 0447   RBC 3.08 (L) 07/13/2021 0447   HGB 9.6 (L) 07/13/2021 0447   HGB 12.8 (L) 03/19/2014 1049   HCT 28.8 (L) 07/13/2021 0447   HCT 37.7 (L) 03/19/2014 1049   PLT 213 07/13/2021 0447   PLT 137 (L) 03/19/2014 1049   MCV 93.5 07/13/2021 0447   MCV 97 03/19/2014 1049   MCH 31.2 07/13/2021 0447   MCHC 33.3 07/13/2021 0447   RDW 15.3 07/13/2021 0447   RDW 13.2 03/19/2014 1049   LYMPHSABS 0.5 (L) 06/27/2021 0334   MONOABS 1.6 (H) 06/27/2021 0334   EOSABS 0.1 06/27/2021 0334   BASOSABS 0.1 06/27/2021 0334   BMP Latest Ref Rng  & Units 07/13/2021 07/12/2021 07/10/2021  Glucose 70 - 99 mg/dL 185(H) 133(H) 184(H)  BUN 8 - 23 mg/dL 74(H) 79(H) 91(H)  Creatinine 0.61 - 1.24 mg/dL 1.98(H) 2.05(H) 1.92(H)  Sodium 135 - 145 mmol/L 142 140 140  Potassium 3.5 - 5.1 mmol/L 3.4(L) 3.6 4.3  Chloride 98 - 111 mmol/L 95(L) 95(L) 100  CO2 22 - 32 mmol/L 40(H) 36(H) 31  Calcium 8.9 - 10.3 mg/dL 8.5(L) 8.8(L) 8.3(L)    Imaging Studies  DG Chest 2 View  Result Date: 07/12/2021 CLINICAL DATA:  Chest pain, cough, and shortness of breath. EXAM: CHEST - 2 VIEW COMPARISON:  Chest x-ray dated July 09, 2021. FINDINGS: Stable cardiomediastinal silhouette. Chronically coarsened interstitial markings. Unchanged mild bibasilar atelectasis and small bilateral pleural effusions. No pneumothorax. No acute osseous abnormality. IMPRESSION: 1. Unchanged mild bibasilar atelectasis and small bilateral pleural effusions. Electronically Signed   By: Titus Dubin M.D.   On: 07/12/2021 11:20   Medications   Scheduled Meds:  acetylcysteine  4 mL Nebulization BID   acidophilus  2 capsule Oral TID   benzonatate  200 mg Oral TID   budesonide (PULMICORT) nebulizer solution  0.5 mg Nebulization BID   chlorpheniramine-HYDROcodone  5 mL Oral Q12H   cholecalciferol  1,000 Units Oral Daily   escitalopram  10 mg Oral Daily   famotidine  10 mg Oral BID   feeding supplement  237 mL Oral TID BM   ferrous sulfate  325 mg Oral Q breakfast   fluticasone  2 spray Each Nare Daily   guaiFENesin  1,200 mg Oral BID   heparin injection (subcutaneous)  5,000 Units Subcutaneous Q8H   mouth rinse  15 mL Mouth Rinse BID   multivitamin with minerals  1 tablet Oral Daily   traZODone  150 mg Oral QHS   umeclidinium-vilanterol  2 puff Inhalation Daily   No recently discontinued medications to reconcile  LOS: 22 days   Time spent: >77min  Lynann Demetrius L Karilyn Wind, DO [facility] 07/13/2021, 7:18 AM   Please refer to amion to contact the [facility] Attending or  Consulting provider for this pt  [email or web address] Available by Epic secure chat 7AM-7PM.  If 7PM-7AM, please contact night-coverage  

## 2021-07-13 NOTE — Progress Notes (Signed)
Nutrition Follow-up  DOCUMENTATION CODES:   Obesity unspecified  INTERVENTION:   -Continue Ensure Enlive po TID, each supplement provides 350 kcal and 20 grams of protein  -Continue MVI with minerals daily  NUTRITION DIAGNOSIS:   Inadequate oral intake related to acute illness as evidenced by other (comment) (pt on clear liquid diet).  Progressing; advanced to soft diet on 06/30/21   GOAL:   Patient will meet greater than or equal to 90% of their needs  Progressing   MONITOR:   PO intake, Supplement acceptance, Labs, Weight trends, Skin, I & O's, Diet advancement  REASON FOR ASSESSMENT:   NPO/Clear Liquid Diet    ASSESSMENT:   87 y/o male with h/o COPD, hypertension, prostate cancer, stage IIIb chronic kidney disease, CVA and sleep apnea who is admitted with peforated diverticulits now s/p ex lap with small bowel resection with removal of 33cm of jejunum on 37/6 complicated by post op ileus.  11/4- s/p Exploratory laparotomy with small bowel resection 11/5- pt pulled out NGT, advanced to clear liquid diet 11/9- advanced to full liquid diet 11/12- s/p BSE- advanced to a mechanical soft diet  Reviewed I/O's: -1.9 L x 24 hours and -5.6 L since 06/29/21   Pt tolerating current diet consistency well. Pt consuming 50-85% of meals. Pt is taking Ensure Enlive supplements.    Pt continues to await medical stability for discharge; he is currently on 6 L HFNC. He is unable to discharge back to SNF until he requires 5 L O2, no high flow.    Medications reviewed and include ferrous sulfate and vitamin D3.   Labs reviewed: K: 3.4.    Diet Order:   Diet Order             Diet - low sodium heart healthy           DIET SOFT Room service appropriate? Yes with Assist; Fluid consistency: Thin  Diet effective now                   EDUCATION NEEDS:   Not appropriate for education at this time  Skin:  Skin Assessment: Skin Integrity Issues: Skin Integrity Issues::  Stage II Stage II: buttocks Incisions: closed abdomen  Last BM:  07/10/21  Height:   Ht Readings from Last 1 Encounters:  06/22/21 5' 8.5" (1.74 m)    Weight:   Wt Readings from Last 1 Encounters:  06/27/21 104.4 kg    Ideal Body Weight:  71.36 kg  BMI:  Body mass index is 34.49 kg/m.  Estimated Nutritional Needs:   Kcal:  1900-2200kcal/day  Protein:  95-110g/day  Fluid:  1.8-2.1L/day    Loistine Chance, RD, LDN, [place] Registered Dietitian II Certified Diabetes Care and Education Specialist Please refer to AMION for RD and/or RD on-call/weekend/after hours pager

## 2021-07-14 ENCOUNTER — Inpatient Hospital Stay: Payer: Medicare PPO

## 2021-07-14 LAB — BASIC METABOLIC PANEL
Anion gap: 9 (ref 5–15)
BUN: 85 mg/dL — ABNORMAL HIGH (ref 8–23)
CO2: 36 mmol/L — ABNORMAL HIGH (ref 22–32)
Calcium: 8.5 mg/dL — ABNORMAL LOW (ref 8.9–10.3)
Chloride: 95 mmol/L — ABNORMAL LOW (ref 98–111)
Creatinine, Ser: 1.88 mg/dL — ABNORMAL HIGH (ref 0.61–1.24)
GFR, Estimated: 33 mL/min — ABNORMAL LOW (ref >60)
Glucose, Bld: 133 mg/dL — ABNORMAL HIGH (ref 70–99)
Potassium: 3.4 mmol/L — ABNORMAL LOW (ref 3.5–5.1)
Sodium: 140 mmol/L (ref 135–145)

## 2021-07-14 MED ORDER — ALBUTEROL SULFATE (2.5 MG/3ML) 0.083% IN NEBU
2.5000 mg | INHALATION_SOLUTION | Freq: Three times a day (TID) | RESPIRATORY_TRACT | Status: DC
  Administered 2021-07-14 – 2021-07-18 (×12): 2.5 mg via RESPIRATORY_TRACT
  Filled 2021-07-14 (×12): qty 3

## 2021-07-14 MED ORDER — SPIRONOLACTONE 25 MG PO TABS
25.0000 mg | ORAL_TABLET | Freq: Every day | ORAL | Status: DC
  Administered 2021-07-14 – 2021-07-16 (×3): 25 mg via ORAL
  Filled 2021-07-14 (×3): qty 1

## 2021-07-14 MED ORDER — ALBUTEROL SULFATE (2.5 MG/3ML) 0.083% IN NEBU: 2.5000 mg | INHALATION_SOLUTION | Freq: Three times a day (TID) | RESPIRATORY_TRACT | Status: DC

## 2021-07-14 MED ORDER — METHYLPREDNISOLONE SODIUM SUCC 40 MG IJ SOLR
40.0000 mg | INTRAMUSCULAR | Status: DC
  Administered 2021-07-14 – 2021-07-15 (×2): 40 mg via INTRAVENOUS
  Filled 2021-07-14 (×2): qty 1

## 2021-07-14 MED ORDER — AZITHROMYCIN 250 MG PO TABS
500.0000 mg | ORAL_TABLET | Freq: Every day | ORAL | Status: DC
  Administered 2021-07-14 – 2021-07-16 (×3): 500 mg via ORAL
  Filled 2021-07-14 (×3): qty 2

## 2021-07-14 MED ORDER — TORSEMIDE 20 MG PO TABS
100.0000 mg | ORAL_TABLET | Freq: Every day | ORAL | Status: DC
  Administered 2021-07-14 – 2021-07-16 (×3): 100 mg via ORAL
  Filled 2021-07-14 (×4): qty 5

## 2021-07-14 NOTE — Consult Note (Signed)
Pulmonary Medicine          Date: 07/14/2021,   MRN# [id number] Trevor Ferguson May 19, 1926     AdmissionWeight: 94.3 kg                 CurrentWeight: 104.4 kg  Referring physician: Dr Ouida Sills    CHIEF COMPLAINT:   Increased O2 requirement    HISTORY OF PRESENT ILLNESS   This is a pleasant 87 yo M with hx of Adanced copd on O2, Smoked 1.5ppd, quit 1944-1987, HTN, OSA, CKD3, CVA, came in due to abd with NVD. On admission he had acute on chronic renal impairment with leukocytosis.  He underwent abd CT on admission with findings of recurrent acute diverticulitis with microperforation, right nephrolithiasis.  He has been treated with antimicrobials and has surgical eval s/p ex lap and resection and has been having increased O2 requirement. PCCM consultation to optimize respiratory status for DC planning. CT chest from 11/8 and recent serial CXRs reviewed with findings of interstitial edema and mild pleural effusion worse on right.   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 male reports that he quit smoking about 33 years ago. His smoking use included cigarettes. He has a 67.50 pack-year smoking history. He has never used smokeless tobacco.  SPIROMETRY: FVC was 2.65 liters, 85% of predicted/Post 2.65, 85%, 0% Change FEV1 was 1.74, 75% of predicted/Post 1.77, 76%, 2% Change FEV1 ratio was 66/Post 67 FEF 25-75% liters per second was 40% of predicted/Post 40%, 0% Change *Patient took Personal Albuterol Inhaler for Post Spirometry  LUNG VOLUMES: TLC was 66% of predicted RV was 42% of predicted  DIFFUSION CAPACITY: DLCO was 42% of predicted DLCO/VA was 61% of predicted  FLOW VOLUME  LOOP:  Scooping of expiratory limb of flow volume loop suggestive of obstructive physiology.  Impression Mixed mild obstructive and restrictive ventilatory defect with decreased DLCO at 42%.   PAST MEDICAL HISTORY   Past Medical History:  Diagnosis Date   COPD (chronic obstructive pulmonary disease) ([place])    Hypertension    Pneumonia    2012,1985 hospitalized   Prostate cancer ([place])    with seed placement   Sleep apnea    noncompliant, dx 1996   Stroke ([place]) 2012   no residuals     SURGICAL HISTORY   Past Surgical History:  Procedure Laterality Date   adenoid     APPENDECTOMY     BOWEL RESECTION N/A 06/22/2021   Procedure: SMALL BOWEL RESECTION;  Surgeon: Herbert Pun, MD;  Location: [facility] ORS;  Service: General;  Laterality: N/A;   COLONOSCOPY WITH PROPOFOL N/A 01/28/2021   Procedure: COLONOSCOPY WITH PROPOFOL;  Surgeon: Lucilla Lame, MD;  Location: [facility] ENDOSCOPY;  Service: Endoscopy;  Laterality: N/A;  GI bleed   ENTEROSCOPY N/A 01/30/2021   Procedure: ENTEROSCOPY;  Surgeon: Lin Landsman, MD;  Location: [facility] ENDOSCOPY;  Service: Gastroenterology;  Laterality: N/A;   ESOPHAGOGASTRODUODENOSCOPY (EGD) WITH PROPOFOL N/A 01/27/2021   Procedure: ESOPHAGOGASTRODUODENOSCOPY (EGD) WITH PROPOFOL;  Surgeon: Lucilla Lame, MD;  Location: [facility] ENDOSCOPY;  Service: Endoscopy;  Laterality: N/A;   GIVENS CAPSULE STUDY N/A 01/28/2021   Procedure: GIVENS CAPSULE STUDY;  Surgeon: Lin Landsman, MD;  Location[facility] ENDOSCOPY;  Service: Gastroenterology;  Laterality: N/A;   HEMORRHOID SURGERY     LAPAROTOMY N/A 06/22/2021   Procedure: EXPLORATORY LAPAROTOMY;  Surgeon: Herbert Pun, MD;  Location: [facility] ORS;  Service: General;  Laterality: N/A;   spine cyst     TONSILLECTOMY       FAMILY HISTORY   Family History  Problem Relation Age of Onset   Congestive Heart Failure Mother    COPD Sister    Asthma Sister    Tuberculosis Maternal Grandmother    Stroke  Maternal Grandfather      SOCIAL HISTORY   Social History   Tobacco Use   Smoking status: Former    Years: 40.00    Types: Cigarettes    Quit date: 08/19/1985    Years since quitting: 35.9   Smokeless tobacco: Never  Vaping Use   Vaping Use: Never used  Substance Use Topics   Alcohol use: Yes    Alcohol/week: 14.0 standard drinks    Types: 14 Shots of liquor per week   Drug use: No     MEDICATIONS    Home Medication:  Current Outpatient Rx   Order #: [id number] Class: No Print   Order #: [id number] Class: No Print   Order #: [id number] Class: Print    Current Medication:  Current Facility-Administered Medications:    0.9 %  sodium chloride infusion, , Intravenous, PRN, Oswald Hillock, MD, Last Rate: 10 mL/hr at 06/26/21 1905, New Bag at 06/26/21 1905   acetaminophen (TYLENOL) tablet 650 mg, 650 mg, Oral, Q6H PRN, 650 mg at 07/06/21 1152 **OR** acetaminophen (TYLENOL) suppository 650 mg, 650 mg, Rectal, Q6H PRN, Darrick Meigs, Marge Duncans, MD   acetylcysteine (MUCOMYST) 20 % nebulizer / oral solution 4 mL, 4 mL, Nebulization, BID, Doristine Mango L, MD, 4 mL at 07/14/21 0714   acidophilus (RISAQUAD) capsule 2 capsule, 2 capsule, Oral, TID, Darrick Meigs, Marge Duncans, MD, 2 capsule at 07/14/21 0945   benzonatate (TESSALON) capsule 200 mg, 200 mg, Oral, TID, Darrick Meigs, Gagan S, MD, 200 mg at 07/14/21 0945   budesonide (PULMICORT) nebulizer solution 0.5 mg, 0.5 mg, Nebulization, BID, Wieting, Richard, MD, 0.5 mg at 07/14/21 0865   chlorpheniramine-HYDROcodone (TUSSIONEX) 10-8 MG/5ML suspension 5 mL, 5 mL, Oral, Q12H, Wieting, Richard, MD, 5 mL at 07/14/21 0944   cholecalciferol (VITAMIN D3) tablet 1,000 Units, 1,000 Units, Oral, Daily, Oswald Hillock, MD, 1,000 Units at 07/14/21 0946   escitalopram (LEXAPRO) tablet 10 mg, 10 mg, Oral, Daily, Darrick Meigs, Gagan S, MD, 10 mg at 07/14/21 0945   famotidine (PEPCID) tablet 10 mg, 10 mg, Oral, BID, Darrick Meigs, Gagan S, MD, 10 mg at 07/14/21 0945   feeding supplement (ENSURE ENLIVE  / ENSURE PLUS) liquid 237 mL, 237 mL, Oral, TID BM, Darrick Meigs, Gagan S, MD, 237 mL at 07/14/21 0957   ferrous sulfate tablet 325 mg, 325 mg, Oral, Q breakfast, Darrick Meigs, Gagan S, MD, 325 mg at 07/14/21 0945   fluticasone (FLONASE) 50 MCG/ACT nasal spray 2 spray, 2 spray, Each Nare, Daily, Mansy, Jan A, MD, 2 spray at 07/14/21 0946   guaiFENesin (MUCINEX) 12 hr tablet 1,200 mg, 1,200 mg, Oral, BID, Eppie Gibson M, MD, 1,200 mg at 07/14/21 0945   heparin injection 5,000 Units, 5,000 Units, Subcutaneous, Q8H, Oswald Hillock, MD, 5,000 Units at 07/14/21 7846   MEDLINE mouth rinse, 15 mL, Mouth Rinse, BID, Darrick Meigs, Marge Duncans, MD, 15 mL at 07/14/21 0946   menthol-cetylpyridinium (CEPACOL) lozenge 3 mg, 1 lozenge, Oral, PRN, Darrick Meigs, Marge Duncans, MD   morphine 2 MG/ML injection  2 mg, 2 mg, Intravenous, Q4H PRN, Darrick Meigs, Marge Duncans, MD, 2 mg at 06/27/21 2008   multivitamin with minerals tablet 1 tablet, 1 tablet, Oral, Daily, Darrick Meigs, Marge Duncans, MD, 1 tablet at 07/14/21 0945   ondansetron (ZOFRAN) tablet 4 mg, 4 mg, Oral, Q6H PRN **OR** ondansetron (ZOFRAN) injection 4 mg, 4 mg, Intravenous, Q6H PRN, Darrick Meigs, Marge Duncans, MD, 4 mg at 06/30/21 2152   oxyCODONE-acetaminophen (PERCOCET/ROXICET) 5-325 MG per tablet 1 tablet, 1 tablet, Oral, Q6H PRN, Oswald Hillock, MD, 1 tablet at 06/30/21 0956   simethicone (MYLICON) chewable tablet 80 mg, 80 mg, Oral, QID PRN, Oswald Hillock, MD, 80 mg at 06/24/21 1508   sodium chloride (OCEAN) 0.65 % nasal spray 1 spray, 1 spray, Each Nare, PRN, Oswald Hillock, MD, 1 spray at 07/06/21 0959   torsemide (DEMADEX) tablet 100 mg, 100 mg, Oral, Daily, Breeze, [place], NP   traZODone (DESYREL) tablet 150 mg, 150 mg, Oral, QHS, Darrick Meigs, Gagan S, MD, 150 mg at 07/13/21 2130   umeclidinium-vilanterol (ANORO ELLIPTA) 62.5-25 MCG/ACT 2 puff, 2 puff, Inhalation, Daily, Richarda Osmond, MD, 2 puff at 07/14/21 0947    ALLERGIES   Penicillins, Bee venom, and Darvon [propoxyphene]     REVIEW OF SYSTEMS    Review  of Systems:  Gen:  Denies  fever, sweats, chills weigh loss  HEENT: Denies blurred vision, double vision, ear pain, eye pain, hearing loss, nose bleeds, sore throat Cardiac:  No dizziness, chest pain or heaviness, chest tightness,edema Resp:   Denies cough or sputum porduction, shortness of breath,wheezing, hemoptysis,  Gi: Denies swallowing difficulty, stomach pain, nausea or vomiting, diarrhea, constipation, bowel incontinence Gu:  Denies bladder incontinence, burning urine Ext:   Denies Joint pain, stiffness or swelling Skin: Denies  skin rash, easy bruising or bleeding or hives Endoc:  Denies polyuria, polydipsia , polyphagia or weight change Psych:   Denies depression, insomnia or hallucinations   Other:  All other systems negative   VS: BP (!) 127/41 (BP Location: Left Arm)   Pulse 92   Temp 97.7 F (36.5 C) (Oral)   Resp 18   Ht 5' 8.5" (1.74 m)   Wt 104.4 kg   SpO2 95%   BMI 34.49 kg/m      PHYSICAL EXAM    GENERAL:NAD, no fevers, chills, no weakness no fatigue HEAD: Normocephalic, atraumatic.  EYES: Pupils equal, round, reactive to light. Extraocular muscles intact. No scleral icterus.  MOUTH: Moist mucosal membrane. Dentition intact. No abscess noted.  EAR, NOSE, THROAT: Clear without exudates. No external lesions.  NECK: Supple. No thyromegaly. No nodules. No JVD.  PULMONARY: Right lower lobe crackles with decreased air entry bilaterally  CARDIOVASCULAR: S1 and S2. Regular rate and rhythm. No murmurs, rubs, or gallops. No edema. Pedal pulses 2+ bilaterally.  GASTROINTESTINAL: Soft, nontender, nondistended. No masses. Positive bowel sounds. No hepatosplenomegaly.  MUSCULOSKELETAL: No swelling, clubbing, or edema. Range of motion full in all extremities.  NEUROLOGIC: Cranial nerves II through XII are intact. No gross focal neurological deficits. Sensation intact. Reflexes intact.  SKIN: No ulceration, lesions, rashes, or cyanosis. Skin warm and dry. Turgor intact.   PSYCHIATRIC: Mood, affect within normal limits. The patient is awake, alert and oriented x 3. Insight, judgment intact.       IMAGING    CT ABDOMEN PELVIS WO CONTRAST  Result Date: 06/26/2021 CLINICAL DATA:  Status post small bowel resection for small bowel diverticulitis. Worsening clinical condition. EXAM: CT ABDOMEN AND PELVIS WITHOUT CONTRAST TECHNIQUE:  Multidetector CT imaging of the abdomen and pelvis was performed following the standard protocol without IV contrast. COMPARISON:  06/21/2021 FINDINGS: Lower chest: Minimal dependent atelectasis or infiltrate in both lungs with tiny right effusion. Hepatobiliary: No focal abnormality in the liver on this study without intravenous contrast. Gallbladder is markedly distended. No intrahepatic or extrahepatic biliary dilation. Pancreas: No focal mass lesion. No dilatation of the main duct. No intraparenchymal cyst. No peripancreatic edema. Spleen: No splenomegaly. No focal mass lesion. Adrenals/Urinary Tract: No adrenal nodule or mass. Right kidney unremarkable. 2 cm low-density lesion interpolar left kidney is stable in the interval, likely a cyst. No evidence for hydroureter. The urinary bladder appears normal for the degree of distention. Stomach/Bowel: Stomach is unremarkable. No gastric wall thickening. No evidence of outlet obstruction. Duodenum is normally positioned as is the ligament of Treitz. Duodenal diverticulum noted. No small bowel wall thickening. No small bowel dilatation. Left abdominal anastomosis is unremarkable without evidence of wall thickening, pneumatosis, or adjacent fluid/extraluminal gas. No small bowel dilatation. The terminal ileum is normal. Right colon is mildly distended with gas and fluid. Left colon is nondilated. Diverticular change noted in the left colon without diverticulitis. Vascular/Lymphatic: There is moderate atherosclerotic calcification of the abdominal aorta without aneurysm. There is no gastrohepatic or  hepatoduodenal ligament lymphadenopathy. No retroperitoneal or mesenteric lymphadenopathy. No pelvic sidewall lymphadenopathy. Reproductive: Brachytherapy seeds noted in the prostate gland. Other: No intraperitoneal free fluid.  No intraperitoneal free air. Musculoskeletal: Diffuse body wall edema. No worrisome lytic or sclerotic osseous abnormality. IMPRESSION: 1. Left abdominal small bowel anastomosis is unremarkable. No evidence for wall thickening, pneumatosis, or adjacent fluid/extraluminal gas. No free fluid in the peritoneal cavity. No small bowel dilatation. 2. Marked gallbladder distension. 3. Right colon is mildly distended with gas and fluid. Imaging features are nonspecific. 4. Left colonic diverticulosis without diverticulitis. 5. Diffuse body wall edema. 6. Aortic Atherosclerosis (ICD10-I70.0). Electronically Signed   By: Misty Stanley M.D.   On: 06/26/2021 18:28   CT ABDOMEN PELVIS WO CONTRAST  Result Date: 06/21/2021 CLINICAL DATA:  87 year old with abdominal distension and right lower quadrant pain EXAM: CT ABDOMEN AND PELVIS WITHOUT CONTRAST TECHNIQUE: Multidetector CT imaging of the abdomen and pelvis was performed following the standard protocol without IV contrast. COMPARISON:  CT 03/10/2020 FINDINGS: Lower chest: Subpleural reticular opacity and ground-glass in the right greater than left lower lobe. Resolution of previous pleural effusions. Hepatobiliary: No focal hepatic abnormality on this unenhanced exam. Unremarkable gallbladder. No calcified gallstone. No biliary dilatation. Pancreas: Age related fatty atrophy No ductal dilatation or inflammation. Spleen: Normal in size without focal abnormality. Adrenals/Urinary Tract: Normal adrenal glands. No hydronephrosis. Small upper pole right renal calculus. Small left renal cyst. Unremarkable urinary bladder. Stomach/Bowel: Decompressed stomach. Fluid-filled duodenal diverticulum without inflammation. There is no inflamed small bowel  diverticula in the right abdomen, series 5, image 32 hand series 2, image 56. Adjacent fluid-filled small bowel and mesenteric edema. Small amount of mottled gas in the adjacent mesentery, series 2, image 54. This does not appear to be within the mesenteric vasculature. There is no small bowel pneumatosis. Regional small bowel are mildly prominent fluid-filled suggesting regional ileus. Fluid within the cecum and ascending colon. There is formed stool in the more distal colon. Descending and sigmoid colonic diverticulosis without colonic diverticulitis. Vascular/Lymphatic: Aortic atherosclerosis. No aortic aneurysm. There is no portal venous gas. No abdominopelvic adenopathy. Reproductive: Brachytherapy seeds in the prostate. Other: Inflammatory changes with stranding and mesenteric edema in the right small bowel mesentery.  Small amount of mottled extraluminal gas related small bowel diverticulitis. No abdominopelvic ascites. No focal or drainable collection. Fat containing bilateral inguinal hernias. Musculoskeletal: Thoracolumbar spine degenerative change. There are no acute or suspicious osseous abnormalities. IMPRESSION: 1. Recurrent acute small bowel diverticulitis in the right abdomen. Small amount of mottled extraluminal gas in the adjacent mesentery consistent with microperforation. No focal or drainable collection. 2. Regional small bowel ileus. 3. Colonic diverticulosis without diverticulitis. 4. Nonobstructing right nephrolithiasis. 5. Subpleural reticular opacity and ground-glass in the right greater than left lower lobe, favoring post infectious/inflammatory sequela. Aortic Atherosclerosis (ICD10-I70.0). Critical Value/emergent results were called by telephone at the time of interpretation on 06/21/2021 at 8:28 pm to provider [facility] , who verbally acknowledged these results. Electronically Signed   By: Keith Rake M.D.   On: 06/21/2021 20:29   DG Chest 2 View  Result Date:  07/12/2021 CLINICAL DATA:  Chest pain, cough, and shortness of breath. EXAM: CHEST - 2 VIEW COMPARISON:  Chest x-ray dated July 09, 2021. FINDINGS: Stable cardiomediastinal silhouette. Chronically coarsened interstitial markings. Unchanged mild bibasilar atelectasis and small bilateral pleural effusions. No pneumothorax. No acute osseous abnormality. IMPRESSION: 1. Unchanged mild bibasilar atelectasis and small bilateral pleural effusions. Electronically Signed   By: Titus Dubin M.D.   On: 07/12/2021 11:20   DG Chest 2 View  Result Date: 07/09/2021 CLINICAL DATA:  Shortness of breath. History of COPD, hypertension pneumonia. Former smoker EXAM: CHEST - 2 VIEW COMPARISON:  None. FINDINGS: The heart is enlarged. Hyperinflated lungs with reticular opacities in the right middle lung as well as bilateral lung bases are unchanged. Bilateral pleural effusions, left greater than right with bibasilar atelectasis is also unchanged. Osteopenia with mild thoracic kyphosis. IMPRESSION: 1.  Stable cardiomegaly. 2. COPD. Reticular opacity in right middle lung as well as in bilateral lung bases with small bilateral pleural effusions concerning for pulmonary edema/infiltrate or chronic interstitial lung disease, not significantly changed. Electronically Signed   By: Keane Police D.O.   On: 07/09/2021 11:02   CT HEAD WO CONTRAST (5MM)  Result Date: 06/26/2021 CLINICAL DATA:  Mental status change, unknown cause EXAM: CT HEAD WITHOUT CONTRAST TECHNIQUE: Contiguous axial images were obtained from the base of the skull through the vertex without intravenous contrast. COMPARISON:  06/23/2021 FINDINGS: Brain: No evidence of acute infarction, hemorrhage, cerebral edema, mass, mass effect, or midline shift. Ventricles and sulci are within normal limits for age. No extra-axial fluid collection. Periventricular white matter changes, likely the sequela of chronic small vessel ischemic disease. Remote lacunar infarct in the  left basal ganglia Vascular: No hyperdense vessel. Atherosclerotic calcifications in the intracranial carotid and vertebral arteries. Skull: Normal. Negative for fracture or focal lesion. Sinuses/Orbits: Mucosal thickening in the ethmoid air cells. Status post bilateral lens replacements. Other: The mastoid air cells are well aerated. IMPRESSION: No acute intracranial process. Electronically Signed   By: Merilyn Baba M.D.   On: 06/26/2021 18:15   CT HEAD WO CONTRAST (5MM)  Result Date: 06/23/2021 CLINICAL DATA:  Unwitnessed fall. EXAM: CT HEAD WITHOUT CONTRAST TECHNIQUE: Contiguous axial images were obtained from the base of the skull through the vertex without intravenous contrast. COMPARISON:  Head CT 03/07/2019 FINDINGS: Brain: Age related atrophy and chronic small vessel ischemia. No intracranial hemorrhage, mass effect, or midline shift. No hydrocephalus. The basilar cisterns are patent. No evidence of territorial infarct or acute ischemia. No extra-axial or intracranial fluid collection. Vascular: Atherosclerosis of skullbase vasculature without hyperdense vessel or abnormal calcification. Skull: No fracture  or focal lesion. Sinuses/Orbits: Chronic opacification of right ethmoid air cells. Trace mucosal thickening in left side of sphenoid sinus. Bilateral cataract resection Other: No confluent scalp hematoma. IMPRESSION: 1. No acute intracranial abnormality. No skull fracture. 2. Age related atrophy and chronic small vessel ischemia. Electronically Signed   By: Keith Rake M.D.   On: 06/23/2021 22:35   US RENAL  Result Date: 06/25/2021 CLINICAL DATA:  Acute kidney insufficiency EXAM: RENAL / URINARY TRACT ULTRASOUND COMPLETE COMPARISON:  CT abdomen 06/21/2021 FINDINGS: Right Kidney: Renal measurements: 10.7 x 5.1 x 5.5 cm = volume: 158 mL. Increased echogenicity with cortical thinning. 10 mm nonobstructing calculus in the upper pole. No hydronephrosis. Left Kidney: Renal measurements: 13.4 x 6 x  5.6 cm = volume: 236 mL. Increased echogenicity with cortical thinning in the upper and midpole. Anechoic cysts measuring up to 1.7 cm in the upper pole. No nephrolithiasis or hydronephrosis identified. Bladder: Appears normal for degree of bladder distention. Other: None. IMPRESSION: 1. Evidence of medical renal disease. 2. Right renal calculus. 3. Left renal cysts. Electronically Signed   By: Ofilia Neas M.D.   On: 06/25/2021 11:05   DG Chest Port 1 View  Result Date: 07/05/2021 CLINICAL DATA:  Shortness of breath. EXAM: PORTABLE CHEST 1 VIEW COMPARISON:  Radiograph 06/30/2021, additional prior exams reviewed. FINDINGS: Unchanged cardiomegaly. Small pleural effusions, left greater than right, slightly increased. Patchy airspace disease in the right mid lung is not significantly changed. Diffuse interstitial opacity. No pneumothorax. Stable osseous structures. IMPRESSION: 1. Unchanged cardiomegaly. Diffuse pulmonary interstitial opacity may represent pulmonary edema. 2. Small pleural effusions, left greater than right, slightly increased. 3. Unchanged patchy airspace disease in the right mid lung, indeterminate for pneumonia or scarring. Electronically Signed   By: Keith Rake M.D.   On: 07/05/2021 18:25   DG Chest Port 1 View  Result Date: 06/30/2021 CLINICAL DATA:  Dyspnea R06.00 (ICD-10-CM) EXAM: PORTABLE CHEST 1 VIEW COMPARISON:  June 26, 2021. FINDINGS: Slightly increased conspicuity of patchy airspace opacity in the right midlung. No visible pleural effusions or pneumothorax. Similar enlarged cardiac silhouette. Degenerative changes of the spine. IMPRESSION: 1. Slightly increased conspicuity of patchy airspace opacity in the right midlung, suspicious for pneumonia. Followup PA and lateral chest X-ray is recommended in 3-4 weeks following trial of antibiotic therapy to ensure resolution and exclude underlying malignancy. 2. Similar cardiomegaly. Electronically Signed   By: Margaretha Sheffield M.D.   On: 06/30/2021 11:43   DG Chest Port 1 View  Result Date: 06/26/2021 CLINICAL DATA:  Hypoxia. EXAM: PORTABLE CHEST 1 VIEW COMPARISON:  Prior chest radiographs 03/06/2020 and earlier. FINDINGS: Cardiomegaly, unchanged. Subtle ill-defined opacity within the right mid-lung. No appreciable airspace consolidation within the left lung. No evidence of pleural effusion or pneumothorax. No acute bony abnormality identified. Degenerative changes of the spine. IMPRESSION: Subtle ill-defined opacity within the right mid-lung, which may reflect scarring, atelectasis or early pneumonia. Clinical correlation is recommended. Additionally, short-interval radiographic follow-up is recommended. Cardiomegaly, unchanged. Electronically Signed   By: Kellie Simmering D.O.   On: 06/26/2021 11:09   DG Abd 2 Views  Result Date: 06/24/2021 CLINICAL DATA:  Abdominal distension EXAM: ABDOMEN - 2 VIEW COMPARISON:  06/21/2021 FINDINGS: Scattered large and small bowel gas is noted. No significant dilatation is noted. Postsurgical changes are seen consistent with recent exploratory laparotomy. These findings are likely related to a postoperative ileus. No definitive free air is seen. No acute bony abnormality is noted. Prostate therapy seeds are seen. IMPRESSION: Changes consistent  with mild postoperative ileus. Electronically Signed   By: Inez Catalina M.D.   On: 06/24/2021 21:26      ASSESSMENT/PLAN   Acute on chronic hypoxemic respiratory failure   Multifactorial in etiology - acutely he has post surgical central abdominal wound which is hindering respiratory capacity. Additional comorbid conditions affecting his oxygenation which are currently being optimized include advanced COPD with OSA overlap syndrome and CKD. Besides this he does have interstitial edema, pleural effusions and atelectasis on current CXR done today.    Bibasilar atelectasis    - patient takes tidal volume of 1200 at baseline , currently at 750cc     - encouraged patient to continue IS at bedside and flutter valve   - he dose expectorate thickened phlegm - patient has mucomyst ordered 33ml 20% bid with RT   - Have added recrutment TID with RT utilizing Metaneb with Aluterol neb  Pleural effusions and Interstitial edema  -Suspect from CKD and post surgical physical stress    -LE edema is 1+   -patient is on diuretic with torsemide 100 daily , ill add aldactone 25 mg today and he has nephrologist following and diuretic regimen may be further adjusted  - electrolyte monitoring - K is lower , aldactone may help this as well. He is on repletion already.   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  Total face to face encounter time for this patient visit was > 66min. >50% of the time was  spent in counseling and coordination of care.   Patient/Family are satisfied with care plan and all questions have been answered.  This document was prepared using Dragon voice recognition software and may include unintentional dictation errors.     Ottie Glazier, M.D.  Division of [place]

## 2021-07-14 NOTE — Progress Notes (Signed)
[facility]  PROGRESS NOTE   Subjective:   Patient seen resting quietly in bed, wife at bedside Oxygen requirement increased to 9 L high flow overnight  Continues to complain of dry cough Edema slowly improving  Lasix drip at 4 mg/h Recorded urine output of 1.5 L over the preceding 24 hours  Objective:  Vital signs in last 24 hours:  Temp:  [97.8 F (36.6 C)-98.3 F (36.8 C)] 97.8 F (36.6 C) (11/26 0903) Pulse Rate:  [78-103] 92 (11/26 0903) Resp:  [18-21] 18 (11/26 0903) BP: (121-136)/(46-81) 136/81 (11/26 0903) SpO2:  [91 %-96 %] 94 % (11/26 0903)  Weight change:  Filed Weights   06/22/21 2134 06/26/21 1549 06/27/21 0458  Weight: 96.2 kg 103.6 kg 104.4 kg    Intake/Output: I/O last 3 completed shifts: In: 505.1 [P.O.:480; I.V.:25.1] Out: 2151 [Urine:2150; Stool:1]   Intake/Output this shift:  No intake/output data recorded.  Physical Exam: General:  No acute distress, resting in bed  Head:  Normocephalic, atraumatic. Moist oral mucosal membranes  Eyes:  Anicteric  Lungs:    decreased breath sounds, mild rhonchi/crackles at bases, O2 8L  Heart:  regular  Abdomen:   +midline incision with staples  Extremities:  1+ peripheral edema.  Neurologic:  Awake, alert, following commands  Skin:  No lesions       Basic Metabolic Panel: Recent Labs  Lab 07/09/21 0524 07/10/21 0805 07/12/21 0639 07/13/21 0447 07/14/21 0539  NA 139 140 140 142 140  K 4.6 4.3 3.6 3.4* 3.4*  CL 102 100 95* 95* 95*  CO2 29 31 36* 40* 36*  GLUCOSE 191* 184* 133* 185* 133*  BUN 85* 91* 79* 74* 85*  CREATININE 2.14* 1.92* 2.05* 1.98* 1.88*  CALCIUM 8.5* 8.3* 8.8* 8.5* 8.5*     CBC: Recent Labs  Lab 07/08/21 0535 07/11/21 0355 07/13/21 0447  WBC  --  9.0 17.5*  HGB 8.4* 9.0* 9.6*  HCT  --  27.0* 28.8*  MCV  --  92.5 93.5  PLT  --  200 213      Urinalysis: No results for input(s): COLORURINE, LABSPEC, PHURINE, GLUCOSEU, HGBUR, BILIRUBINUR, KETONESUR,  PROTEINUR, UROBILINOGEN, NITRITE, LEUKOCYTESUR in the last 72 hours.  Invalid input(s): APPERANCEUR    Imaging: No results found.   Medications:    sodium chloride 10 mL/hr at 06/26/21 1905   furosemide (LASIX) 200 mg in dextrose 5% 100 mL (2mg /mL) infusion 4 mg/hr (07/13/21 1201)    acetylcysteine  4 mL Nebulization BID   acidophilus  2 capsule Oral TID   benzonatate  200 mg Oral TID   budesonide (PULMICORT) nebulizer solution  0.5 mg Nebulization BID   chlorpheniramine-HYDROcodone  5 mL Oral Q12H   cholecalciferol  1,000 Units Oral Daily   escitalopram  10 mg Oral Daily   famotidine  10 mg Oral BID   feeding supplement  237 mL Oral TID BM   ferrous sulfate  325 mg Oral Q breakfast   fluticasone  2 spray Each Nare Daily   guaiFENesin  1,200 mg Oral BID   heparin injection (subcutaneous)  5,000 Units Subcutaneous Q8H   mouth rinse  15 mL Mouth Rinse BID   multivitamin with minerals  1 tablet Oral Daily   traZODone  150 mg Oral QHS   umeclidinium-vilanterol  2 puff Inhalation Daily    Assessment/ Plan:     Active Problems:   COPD with acute exacerbation ([place])   Acute diverticulitis   AKI (acute kidney injury) ([place])  Diverticulitis   Hypoxia   Perforation of intestine due to diverticulitis of gastrointestinal tract   HAP (hospital-acquired pneumonia)   Abdominal distension (gaseous)   Pressure injury of skin   Dyspnea   Acute on chronic respiratory failure with hypoxia (HCC)   Acute diastolic congestive heart failure [facility])  Trevor Ferguson is a 87 y.o. white male with hypertension, coronary artery disease, hyperlipidemia, COPD, obstructive sleep apnea, who is admitted to [facility] on 06/21/2021 for Diverticulitis [K57.92] AKI (acute kidney injury) ([place]) [N17.9] Acute diverticulitis [K57.92] Perforation of intestine due to diverticulitis of gastrointestinal tract [K57.80]  Patient underwent Exlap with bowel resection on 06/22/2021.   Acute kidney injury on chronic  kidney disease stage IIIB with proteinuria: baseline creatinine of 1.5, GFR of 43 on 07/05/21. Acute kidney injury secondary to prerenal azotemia and ATN. Nonoliguric urine output.  No indication for dialysis.   - Creatinine remains stable   Anemia of chronic kidney disease: hemoglobin 9.6, normocytic. No indication for ESA. History of iron deficiency.   - Continue ferrous sulfate.   Hypertension with chronic kidney disease:  - Continue losartan and amlodipine -BP stable at 136/81   4.  Lower extremity edema.    Patient responding well with improved fluid status Will stop Lasix drip and prescribe Torsemide 100mg  dialy  5.  Metabolic alkalosis likely due to diuresis VBG shows pH of 7.52 with PCO2 at 46 Acetazolamide given yesterday Will discontinue Lasix drip today    LOS: [facility] 11/26/202211:46 AM

## 2021-07-14 NOTE — Progress Notes (Signed)
PROGRESS NOTE  Trevor Ferguson    DOB: September 03, 1925, 87 y.o.  VQQ:[id number]  PCP: [name], MD   Code Status: DNR   DOA: 06/21/2021   LOS: 81  Brief Narrative of Current Hospitalization  Trevor Ferguson is a 87 y.o. male with a PMH significant for COPD, HTN, HLD, sleep apnea, CKD 3B, CVA. They presented from SNF to the ED on 06/21/2021 with abdominal pain x a few days. In the ED, it was found that they had small bowel diverticulitis with possible microperforation.  General surgery was consulted. 11/4 patient underwent exploratory laparotomy with small bowel resection 11/6 postoperative ileus 11/8 having bowel movements, 3 L O2 requirement, increased lethargy 11/10 O2 requirement 4-3P, metabolic acidosis improving with bicarb gtt, unable to tolerate PO 11/12 mental status improved. Metabolic acidosis resolved. Able to dc bicarb drip 07/14/21 -stable  Assessment & Plan  Active Problems:   COPD with acute exacerbation (HCC)   Acute diverticulitis   AKI (acute kidney injury) ([place])   Diverticulitis   Hypoxia   Perforation of intestine due to diverticulitis of gastrointestinal tract   HAP (hospital-acquired pneumonia)   Abdominal distension (gaseous)   Pressure injury of skin   Dyspnea   Acute on chronic respiratory failure with hypoxia (HCC)   Acute diastolic congestive heart failure ([place])  Acute small bowel diverticulitis- s/p resection 29/5 complicated by postoperative ileus.  Now having normal Bms (last recorded 11/24). Tolerating soft diet without abdominal pain or nausea. -Surgery following, appreciate recommendations -tolerating diet well. Continue soft diet until OP surgery follow up -Zosyn per general surgery (11/8-11/10), flagyl (11/4-11/8) - gentle bowel regimen, imodium PRN   Acute hypoxic respiratory failure 2/2 atelectasis versus early pneumonia  COPD - Patient endorses using breathing treatment and on 2L O2 at baseline. Will likely need new higher requirement on  discharge. Chest xray 11/21 showed signs consistent with fluid overload and unchanged on repeat cxray 11/24. He was started on a lasix drip conservatively to avoid worsening of kidney function.  WBC excessive jump from 9>17.5 in 2 days. - completed HAP treatment             - azithromycin (11/9-11/14)             - CTX (11/12-11/16) - anoro daily - pulmicort BID - wean to 2-5L as tolerated - PT/OT - flonase daily - consult pulmonology, per patient's daughter request   Encephalopathy- resolved. Conversant and oriented today. - palliative consulted.              - meeting with family to discuss [place] 11/11- continue current course of treatment and dc with hospice/palliative to facility - continue PT/OT - attempt to get out of bed and limit night time interference to avoid delirium.    AKI on CKD IIIb  metabolic acidosis (resolved)  mild hypokalemia (resolved)- resolved. Cr 3.7>>>1.98>1.88. K+ 3.4 Improved with decreasing his lasix rate -Nephrology following, appreciate recommendations -Monitor serum bicarb/electrolytes/Cr daily, BMP am - replete electrolytes PRN   Primary HTN-well-controlled with baseline low diastolic readings -discontinuing amlodipine to help prevent vascular leaking -Holding home ACE inhibitor due to kidney function   Depression -Continue home Lexapro   GERD -Continue home PPI  Chronic iron deficiency anemia -Continue home iron  DVT prophylaxis: Place and maintain sequential compression device Start: 07/04/21 1447 heparin injection 5,000 Units Start: 06/27/21 0600   Diet:  Diet Orders (From admission, onward)     Start     Ordered   07/05/21 0000  Diet - low sodium heart healthy        07/05/21 1257   06/30/21 1443  DIET SOFT Room service appropriate? Yes with Assist; Fluid consistency: Thin  Diet effective now       Comments: Please CUT the meats and add Gravy;  NO STRAWS!!! CREAM SOUPS ONLY  Question Answer Comment  Room service appropriate? Yes  with Assist   Fluid consistency: Thin      06/30/21 1443            Subjective 07/14/21    Pt reports not sure how he's doing quite yet due to just waking up. Denies any complaints at this time.   Disposition Plan & Communication  Patient status: Inpatient  Admitted From: SNF Disposition: Skilled nursing facility Anticipated discharge date: TBD, when can tolerate O2 stabilization around 5L for the SNF requirements  Family Communication: daughter via telephone Consults, Procedures, Significant Events  Consultants:  Nephrology General surgery Palliative  Pulmonology    Procedures/significant events:  Small bowel resection 11/4  Antimicrobials:  Anti-infectives (From admission, onward)    Start     Dose/Rate Route Frequency Ordered Stop   06/30/21 1300  cefTRIAXone (ROCEPHIN) 2 g in sodium chloride 0.9 % 100 mL IVPB        2 g 200 mL/hr over 30 Minutes Intravenous Every 24 hours 06/30/21 1205 07/03/21 1615   06/27/21 1400  azithromycin (ZITHROMAX) 250 mg in dextrose 5 % 125 mL IVPB        250 mg 125 mL/hr over 60 Minutes Intravenous Every 24 hours 06/27/21 1239 07/01/21 1759   06/26/21 1800  piperacillin-tazobactam (ZOSYN) IVPB 2.25 g  Status:  Discontinued        2.25 g 100 mL/hr over 30 Minutes Intravenous Every 6 hours 06/26/21 1604 06/28/21 0748   06/22/21 2100  ceFEPIme (MAXIPIME) 2 g in sodium chloride 0.9 % 100 mL IVPB  Status:  Discontinued        2 g 200 mL/hr over 30 Minutes Intravenous Every 24 hours 06/21/21 2331 06/26/21 1604   06/22/21 0600  metroNIDAZOLE (FLAGYL) IVPB 500 mg  Status:  Discontinued        500 mg 100 mL/hr over 60 Minutes Intravenous Every 8 hours 06/21/21 2331 06/26/21 1604   06/21/21 2100  ceFEPIme (MAXIPIME) 2 g in sodium chloride 0.9 % 100 mL IVPB        2 g 200 mL/hr over 30 Minutes Intravenous  Once 06/21/21 2050 06/21/21 2130   06/21/21 2100  metroNIDAZOLE (FLAGYL) IVPB 500 mg        500 mg 100 mL/hr over 60 Minutes Intravenous   Once 06/21/21 2050 06/22/21 1616       Objective   Vitals:   07/13/21 0757 07/13/21 1106 07/13/21 1608 07/14/21 0055  BP: (!) 141/79 128/68 122/64 (!) 121/46  Pulse: 100 90 (!) 103 78  Resp: 18 18 18  (!) 21  Temp: 98 F (36.7 C) 98.8 F (37.1 C) 98.3 F (36.8 C) 98.3 F (36.8 C)  TempSrc:  Oral Oral   SpO2: 97% 90% 91% 96%  Weight:      Height:        Intake/Output Summary (Last 24 hours) at 07/14/2021 0743 Last data filed at 07/14/2021 0539 Gross per 24 hour  Intake 497.2 ml  Output 1501 ml  Net -1003.8 ml    Filed Weights   06/22/21 2134 06/26/21 1549 06/27/21 0458  Weight: 96.2 kg 103.6 kg 104.4 kg  Patient BMI: Body mass index is 34.49 kg/m.   Physical Exam: General: awake, alert, NAD HEENT: atraumatic, clear conjunctiva, anicteric sclera, moist mucus membranes, hard of hearing Respiratory: normal respiratory effort. Lung fields clear. Cardiovascular: normal S1/S2,  RRR, no JVD, murmurs, rubs, gallops, quick capillary refill  Gastrointestinal: soft, NT, ND, no HSM felt Nervous: A&O x2. no gross focal neurologic deficits, normal speech Extremities: moves all equally, no edema, normal tone Skin: dry, intact, normal temperature, normal color, No rashes, lesions or ulcers Psychiatry: normal mood, congruent affect  Labs   CBC    Component Value Date/Time   WBC 17.5 (H) 07/13/2021 0447   RBC 3.08 (L) 07/13/2021 0447   HGB 9.6 (L) 07/13/2021 0447   HGB 12.8 (L) 03/19/2014 1049   HCT 28.8 (L) 07/13/2021 0447   HCT 37.7 (L) 03/19/2014 1049   PLT 213 07/13/2021 0447   PLT 137 (L) 03/19/2014 1049   MCV 93.5 07/13/2021 0447   MCV 97 03/19/2014 1049   MCH 31.2 07/13/2021 0447   MCHC 33.3 07/13/2021 0447   RDW 15.3 07/13/2021 0447   RDW 13.2 03/19/2014 1049   LYMPHSABS 0.5 (L) 06/27/2021 0334   MONOABS 1.6 (H) 06/27/2021 0334   EOSABS 0.1 06/27/2021 0334   BASOSABS 0.1 06/27/2021 0334   BMP Latest Ref Rng & Units 07/14/2021 07/13/2021 07/12/2021  Glucose  70 - 99 mg/dL 133(H) 185(H) 133(H)  BUN 8 - 23 mg/dL 85(H) 74(H) 79(H)  Creatinine 0.61 - 1.24 mg/dL 1.88(H) 1.98(H) 2.05(H)  Sodium 135 - 145 mmol/L 140 142 140  Potassium 3.5 - 5.1 mmol/L 3.4(L) 3.4(L) 3.6  Chloride 98 - 111 mmol/L 95(L) 95(L) 95(L)  CO2 22 - 32 mmol/L 36(H) 40(H) 36(H)  Calcium 8.9 - 10.3 mg/dL 8.5(L) 8.5(L) 8.8(L)    Imaging Studies  No results found. Medications   Scheduled Meds:  acetylcysteine  4 mL Nebulization BID   acidophilus  2 capsule Oral TID   benzonatate  200 mg Oral TID   budesonide (PULMICORT) nebulizer solution  0.5 mg Nebulization BID   chlorpheniramine-HYDROcodone  5 mL Oral Q12H   cholecalciferol  1,000 Units Oral Daily   escitalopram  10 mg Oral Daily   famotidine  10 mg Oral BID   feeding supplement  237 mL Oral TID BM   ferrous sulfate  325 mg Oral Q breakfast   fluticasone  2 spray Each Nare Daily   guaiFENesin  1,200 mg Oral BID   heparin injection (subcutaneous)  5,000 Units Subcutaneous Q8H   mouth rinse  15 mL Mouth Rinse BID   multivitamin with minerals  1 tablet Oral Daily   traZODone  150 mg Oral QHS   umeclidinium-vilanterol  2 puff Inhalation Daily   No recently discontinued medications to reconcile  LOS: 23 days   Time spent: >64min  Naisha Wisdom L Zakyia Gagan, DO [facility] 07/14/2021, 7:43 AM   Please refer to amion to contact the [facility] Attending or Consulting provider for this pt  [email or web address] Available by Epic secure chat 7AM-7PM. If 7PM-7AM, please contact night-coverage

## 2021-07-15 DIAGNOSIS — J811 Chronic pulmonary edema: Secondary | ICD-10-CM

## 2021-07-15 DIAGNOSIS — I4891 Unspecified atrial fibrillation: Secondary | ICD-10-CM

## 2021-07-15 LAB — BASIC METABOLIC PANEL
Anion gap: 10 (ref 5–15)
BUN: 94 mg/dL — ABNORMAL HIGH (ref 8–23)
CO2: 33 mmol/L — ABNORMAL HIGH (ref 22–32)
Calcium: 8.2 mg/dL — ABNORMAL LOW (ref 8.9–10.3)
Chloride: 95 mmol/L — ABNORMAL LOW (ref 98–111)
Creatinine, Ser: 2.24 mg/dL — ABNORMAL HIGH (ref 0.61–1.24)
GFR, Estimated: 27 mL/min — ABNORMAL LOW (ref >60)
Glucose, Bld: 205 mg/dL — ABNORMAL HIGH (ref 70–99)
Potassium: 3.8 mmol/L (ref 3.5–5.1)
Sodium: 138 mmol/L (ref 135–145)

## 2021-07-15 LAB — CBC
HCT: 28.2 % — ABNORMAL LOW (ref 39.0–52.0)
Hemoglobin: 9.4 g/dL — ABNORMAL LOW (ref 13.0–17.0)
MCH: 31.2 pg (ref 26.0–34.0)
MCHC: 33.3 g/dL (ref 30.0–36.0)
MCV: 93.7 fL (ref 80.0–100.0)
Platelets: 179 10*3/uL (ref 150–400)
RBC: 3.01 MIL/uL — ABNORMAL LOW (ref 4.22–5.81)
RDW: 15.1 % (ref 11.5–15.5)
WBC: 8.5 10*3/uL (ref 4.0–10.5)
nRBC: 0 % (ref 0.0–0.2)

## 2021-07-15 MED ORDER — PREDNISONE 20 MG PO TABS
40.0000 mg | ORAL_TABLET | Freq: Every day | ORAL | Status: DC
  Administered 2021-07-16: 09:00:00 40 mg via ORAL
  Filled 2021-07-15: qty 2

## 2021-07-15 MED ORDER — SM DOUBLE ANTIBIOTIC 500-10000 UNIT/GM EX OINT
1.0000 "application " | TOPICAL_OINTMENT | Freq: Two times a day (BID) | CUTANEOUS | Status: DC
  Administered 2021-07-16 – 2021-07-18 (×6): 1 via TOPICAL
  Filled 2021-07-15 (×4): qty 1

## 2021-07-15 NOTE — Progress Notes (Signed)
Pulmonary Medicine          Date: 07/15/2021,   MRN# [id number] LENORD FRALIX Oct 28, 1925     AdmissionWeight: 94.3 kg                 CurrentWeight: 104.4 kg  Referring physician: Dr Ouida Sills    CHIEF COMPLAINT:   Increased O2 requirement    HISTORY OF PRESENT ILLNESS   This is a pleasant 87 yo M with hx of Adanced copd on O2, Smoked 1.5ppd, quit 1944-1987, HTN, OSA, CKD3, CVA, came in due to abd with NVD. On admission he had acute on chronic renal impairment with leukocytosis.  He underwent abd CT on admission with findings of recurrent acute diverticulitis with microperforation, right nephrolithiasis.  He has been treated with antimicrobials and has surgical eval s/p ex lap and resection and has been having increased O2 requirement. PCCM consultation to optimize respiratory status for DC planning. CT chest from 11/8 and recent serial CXRs reviewed with findings of interstitial edema and mild pleural effusion worse on right.   07/15/21- patient improved. Ow weaned to 3.5L.  PT/OT in progress. Continue metaneb as previous. Mild AKI not concerning continue diuresis. Will dc tussinex , mucomyst, solumedrol, pulmicort.  Keeping oxycontin for analgesia, steroids with prednisone PO.  PAST MEDICAL HISTORY   Past Medical History:  Diagnosis Date   COPD (chronic obstructive pulmonary disease) ([place])    Hypertension    Pneumonia    2012,1985 hospitalized   Prostate cancer ([place])    with seed placement   Sleep apnea    noncompliant, dx 1996   Stroke ([place]) 2012   no residuals     SURGICAL HISTORY   Past Surgical History:  Procedure Laterality Date   adenoid     APPENDECTOMY     BOWEL RESECTION N/A 06/22/2021   Procedure: SMALL BOWEL RESECTION;  Surgeon: Herbert Pun, MD;  Location: [facility] ORS;  Service: General;  Laterality: N/A;   COLONOSCOPY WITH PROPOFOL N/A 01/28/2021   Procedure: COLONOSCOPY WITH PROPOFOL;  Surgeon: Lucilla Lame, MD;  Location: [facility]  ENDOSCOPY;  Service: Endoscopy;  Laterality: N/A;  GI bleed   ENTEROSCOPY N/A 01/30/2021   Procedure: ENTEROSCOPY;  Surgeon: Lin Landsman, MD;  Location: [facility] ENDOSCOPY;  Service: Gastroenterology;  Laterality: N/A;   ESOPHAGOGASTRODUODENOSCOPY (EGD) WITH PROPOFOL N/A 01/27/2021   Procedure: ESOPHAGOGASTRODUODENOSCOPY (EGD) WITH PROPOFOL;  Surgeon: Lucilla Lame, MD;  Location: [facility] ENDOSCOPY;  Service: Endoscopy;  Laterality: N/A;   GIVENS CAPSULE STUDY N/A 01/28/2021   Procedure: GIVENS CAPSULE STUDY;  Surgeon: Lin Landsman, MD;  Location: [facility] ENDOSCOPY;  Service: Gastroenterology;  Laterality: N/A;   HEMORRHOID SURGERY     LAPAROTOMY N/A 06/22/2021   Procedure: EXPLORATORY LAPAROTOMY;  Surgeon: Herbert Pun, MD;  Location: [facility] ORS;  Service: General;  Laterality: N/A;   spine cyst     TONSILLECTOMY       FAMILY HISTORY   Family History  Problem Relation Age of Onset   Congestive Heart Failure Mother    COPD Sister    Asthma Sister    Tuberculosis Maternal Grandmother    Stroke Maternal Grandfather      SOCIAL HISTORY   Social History   Tobacco Use   Smoking status: Former    Years: 40.00    Types: Cigarettes    Quit date: 08/19/1985    Years since quitting: 35.9   Smokeless tobacco: Never  Vaping Use   Vaping Use: Never used  Substance Use  Topics   Alcohol use: Yes    Alcohol/week: 14.0 standard drinks    Types: 14 Shots of liquor per week   Drug use: No     MEDICATIONS    Home Medication:  Current Outpatient Rx   Order #: [id number] Class: No Print   Order #: [id number] Class: No Print   Order #: [id number] Class: Print    Current Medication:  Current Facility-Administered Medications:    0.9 %  sodium chloride infusion, , Intravenous, PRN, Oswald Hillock, MD, Last Rate: 10 mL/hr at 06/26/21 1905, New Bag at 06/26/21 1905   acetaminophen (TYLENOL) tablet 650 mg, 650 mg, Oral, Q6H PRN, 650 mg at 07/06/21 1152 **OR** acetaminophen (TYLENOL)  suppository 650 mg, 650 mg, Rectal, Q6H PRN, Darrick Meigs, Marge Duncans, MD   acetylcysteine (MUCOMYST) 20 % nebulizer / oral solution 4 mL, 4 mL, Nebulization, BID, Doristine Mango L, MD, 4 mL at 07/15/21 0840   acidophilus (RISAQUAD) capsule 2 capsule, 2 capsule, Oral, TID, Darrick Meigs, Marge Duncans, MD, 2 capsule at 07/15/21 0923   albuterol (PROVENTIL) (2.5 MG/3ML) 0.083% nebulizer solution 2.5 mg, 2.5 mg, Nebulization, TID, Lanney Gins, Iraida Cragin, MD, 2.5 mg at 07/15/21 0840   azithromycin (ZITHROMAX) tablet 500 mg, 500 mg, Oral, Daily, Lanney Gins, Gianny Sabino, MD, 500 mg at 07/15/21 0865   benzonatate (TESSALON) capsule 200 mg, 200 mg, Oral, TID, Darrick Meigs, Gagan S, MD, 200 mg at 07/15/21 7846   chlorpheniramine-HYDROcodone (TUSSIONEX) 10-8 MG/5ML suspension 5 mL, 5 mL, Oral, Q12H, Wieting, Richard, MD, 5 mL at 07/15/21 9629   cholecalciferol (VITAMIN D3) tablet 1,000 Units, 1,000 Units, Oral, Daily, Oswald Hillock, MD, 1,000 Units at 07/15/21 0923   escitalopram (LEXAPRO) tablet 10 mg, 10 mg, Oral, Daily, Darrick Meigs, Gagan S, MD, 10 mg at 07/15/21 5284   famotidine (PEPCID) tablet 10 mg, 10 mg, Oral, BID, Darrick Meigs, Gagan S, MD, 10 mg at 07/15/21 1324   feeding supplement (ENSURE ENLIVE / ENSURE PLUS) liquid 237 mL, 237 mL, Oral, TID BM, Darrick Meigs, Gagan S, MD, 237 mL at 07/15/21 4010   ferrous sulfate tablet 325 mg, 325 mg, Oral, Q breakfast, Darrick Meigs, Gagan S, MD, 325 mg at 07/15/21 0923   fluticasone (FLONASE) 50 MCG/ACT nasal spray 2 spray, 2 spray, Each Nare, Daily, Mansy, Jan A, MD, 2 spray at 07/15/21 0923   guaiFENesin (MUCINEX) 12 hr tablet 1,200 mg, 1,200 mg, Oral, BID, Wyvonnia Dusky, MD, 1,200 mg at 07/15/21 2725   heparin injection 5,000 Units, 5,000 Units, Subcutaneous, Q8H, Oswald Hillock, MD, 5,000 Units at 07/15/21 0640   MEDLINE mouth rinse, 15 mL, Mouth Rinse, BID, Darrick Meigs, Gagan S, MD, 15 mL at 07/15/21 3664   menthol-cetylpyridinium (CEPACOL) lozenge 3 mg, 1 lozenge, Oral, PRN, Darrick Meigs, Marge Duncans, MD   methylPREDNISolone sodium succinate  (SOLU-MEDROL) 40 mg/mL injection 40 mg, 40 mg, Intravenous, Q24H, Isha Seefeld, MD, 40 mg at 07/14/21 1655   morphine 2 MG/ML injection 2 mg, 2 mg, Intravenous, Q4H PRN, Oswald Hillock, MD, 2 mg at 06/27/21 2008   multivitamin with minerals tablet 1 tablet, 1 tablet, Oral, Daily, Darrick Meigs, Marge Duncans, MD, 1 tablet at 07/15/21 0922   ondansetron (ZOFRAN) tablet 4 mg, 4 mg, Oral, Q6H PRN **OR** ondansetron (ZOFRAN) injection 4 mg, 4 mg, Intravenous, Q6H PRN, Darrick Meigs, Marge Duncans, MD, 4 mg at 06/30/21 2152   oxyCODONE-acetaminophen (PERCOCET/ROXICET) 5-325 MG per tablet 1 tablet, 1 tablet, Oral, Q6H PRN, Oswald Hillock, MD, 1 tablet at 06/30/21 0956   simethicone (MYLICON) chewable tablet 80 mg, 80 mg,  Oral, QID PRN, Oswald Hillock, MD, 80 mg at 06/24/21 1508   sodium chloride (OCEAN) 0.65 % nasal spray 1 spray, 1 spray, Each Nare, PRN, Oswald Hillock, MD, 1 spray at 07/06/21 4696   spironolactone (ALDACTONE) tablet 25 mg, 25 mg, Oral, Daily, Ottie Glazier, MD, 25 mg at 07/15/21 2952   torsemide (DEMADEX) tablet 100 mg, 100 mg, Oral, Daily, Breeze, Shantelle, NP, 100 mg at 07/15/21 8413   traZODone (DESYREL) tablet 150 mg, 150 mg, Oral, QHS, Darrick Meigs, Gagan S, MD, 150 mg at 07/14/21 2235   umeclidinium-vilanterol (ANORO ELLIPTA) 62.5-25 MCG/ACT 2 puff, 2 puff, Inhalation, Daily, Richarda Osmond, MD, 2 puff at 07/15/21 2440    ALLERGIES   Penicillins, Bee venom, and Darvon [propoxyphene]     REVIEW OF SYSTEMS    Review of Systems:  Gen:  Denies  fever, sweats, chills weigh loss  HEENT: Denies blurred vision, double vision, ear pain, eye pain, hearing loss, nose bleeds, sore throat Cardiac:  No dizziness, chest pain or heaviness, chest tightness,edema Resp:   Denies cough or sputum porduction, shortness of breath,wheezing, hemoptysis,  Gi: Denies swallowing difficulty, stomach pain, nausea or vomiting, diarrhea, constipation, bowel incontinence Gu:  Denies bladder incontinence, burning urine Ext:    Denies Joint pain, stiffness or swelling Skin: Denies  skin rash, easy bruising or bleeding or hives Endoc:  Denies polyuria, polydipsia , polyphagia or weight change Psych:   Denies depression, insomnia or hallucinations   Other:  All other systems negative   VS: BP (!) 127/50 (BP Location: Left Arm)   Pulse 88   Temp (!) 97.5 F (36.4 C) (Oral)   Resp 17   Ht 5' 8.5" (1.74 m)   Wt 104.4 kg   SpO2 95%   BMI 34.49 kg/m      PHYSICAL EXAM    GENERAL:NAD, no fevers, chills, no weakness no fatigue HEAD: Normocephalic, atraumatic.  EYES: Pupils equal, round, reactive to light. Extraocular muscles intact. No scleral icterus.  MOUTH: Moist mucosal membrane. Dentition intact. No abscess noted.  EAR, NOSE, THROAT: Clear without exudates. No external lesions.  NECK: Supple. No thyromegaly. No nodules. No JVD.  PULMONARY: Right lower lobe crackles with decreased air entry bilaterally  CARDIOVASCULAR: S1 and S2. Regular rate and rhythm. No murmurs, rubs, or gallops. No edema. Pedal pulses 2+ bilaterally.  GASTROINTESTINAL: Soft, nontender, nondistended. No masses. Positive bowel sounds. No hepatosplenomegaly.  MUSCULOSKELETAL: No swelling, clubbing, or edema. Range of motion full in all extremities.  NEUROLOGIC: Cranial nerves II through XII are intact. No gross focal neurological deficits. Sensation intact. Reflexes intact.  SKIN: No ulceration, lesions, rashes, or cyanosis. Skin warm and dry. Turgor intact.  PSYCHIATRIC: Mood, affect within normal limits. The patient is awake, alert and oriented x 3. Insight, judgment intact.       IMAGING    CT ABDOMEN PELVIS WO CONTRAST  Result Date: 06/26/2021 CLINICAL DATA:  Status post small bowel resection for small bowel diverticulitis. Worsening clinical condition. EXAM: CT ABDOMEN AND PELVIS WITHOUT CONTRAST TECHNIQUE: Multidetector CT imaging of the abdomen and pelvis was performed following the standard protocol without IV contrast.  COMPARISON:  06/21/2021 FINDINGS: Lower chest: Minimal dependent atelectasis or infiltrate in both lungs with tiny right effusion. Hepatobiliary: No focal abnormality in the liver on this study without intravenous contrast. Gallbladder is markedly distended. No intrahepatic or extrahepatic biliary dilation. Pancreas: No focal mass lesion. No dilatation of the main duct. No intraparenchymal cyst. No peripancreatic edema. Spleen: No splenomegaly.  No focal mass lesion. Adrenals/Urinary Tract: No adrenal nodule or mass. Right kidney unremarkable. 2 cm low-density lesion interpolar left kidney is stable in the interval, likely a cyst. No evidence for hydroureter. The urinary bladder appears normal for the degree of distention. Stomach/Bowel: Stomach is unremarkable. No gastric wall thickening. No evidence of outlet obstruction. Duodenum is normally positioned as is the ligament of Treitz. Duodenal diverticulum noted. No small bowel wall thickening. No small bowel dilatation. Left abdominal anastomosis is unremarkable without evidence of wall thickening, pneumatosis, or adjacent fluid/extraluminal gas. No small bowel dilatation. The terminal ileum is normal. Right colon is mildly distended with gas and fluid. Left colon is nondilated. Diverticular change noted in the left colon without diverticulitis. Vascular/Lymphatic: There is moderate atherosclerotic calcification of the abdominal aorta without aneurysm. There is no gastrohepatic or hepatoduodenal ligament lymphadenopathy. No retroperitoneal or mesenteric lymphadenopathy. No pelvic sidewall lymphadenopathy. Reproductive: Brachytherapy seeds noted in the prostate gland. Other: No intraperitoneal free fluid.  No intraperitoneal free air. Musculoskeletal: Diffuse body wall edema. No worrisome lytic or sclerotic osseous abnormality. IMPRESSION: 1. Left abdominal small bowel anastomosis is unremarkable. No evidence for wall thickening, pneumatosis, or adjacent  fluid/extraluminal gas. No free fluid in the peritoneal cavity. No small bowel dilatation. 2. Marked gallbladder distension. 3. Right colon is mildly distended with gas and fluid. Imaging features are nonspecific. 4. Left colonic diverticulosis without diverticulitis. 5. Diffuse body wall edema. 6. Aortic Atherosclerosis (ICD10-I70.0). Electronically Signed   By: Misty Stanley M.D.   On: 06/26/2021 18:28   CT ABDOMEN PELVIS WO CONTRAST  Result Date: 06/21/2021 CLINICAL DATA:  87 year old with abdominal distension and right lower quadrant pain EXAM: CT ABDOMEN AND PELVIS WITHOUT CONTRAST TECHNIQUE: Multidetector CT imaging of the abdomen and pelvis was performed following the standard protocol without IV contrast. COMPARISON:  CT 03/10/2020 FINDINGS: Lower chest: Subpleural reticular opacity and ground-glass in the right greater than left lower lobe. Resolution of previous pleural effusions. Hepatobiliary: No focal hepatic abnormality on this unenhanced exam. Unremarkable gallbladder. No calcified gallstone. No biliary dilatation. Pancreas: Age related fatty atrophy No ductal dilatation or inflammation. Spleen: Normal in size without focal abnormality. Adrenals/Urinary Tract: Normal adrenal glands. No hydronephrosis. Small upper pole right renal calculus. Small left renal cyst. Unremarkable urinary bladder. Stomach/Bowel: Decompressed stomach. Fluid-filled duodenal diverticulum without inflammation. There is no inflamed small bowel diverticula in the right abdomen, series 5, image 32 hand series 2, image 56. Adjacent fluid-filled small bowel and mesenteric edema. Small amount of mottled gas in the adjacent mesentery, series 2, image 54. This does not appear to be within the mesenteric vasculature. There is no small bowel pneumatosis. Regional small bowel are mildly prominent fluid-filled suggesting regional ileus. Fluid within the cecum and ascending colon. There is formed stool in the more distal colon.  Descending and sigmoid colonic diverticulosis without colonic diverticulitis. Vascular/Lymphatic: Aortic atherosclerosis. No aortic aneurysm. There is no portal venous gas. No abdominopelvic adenopathy. Reproductive: Brachytherapy seeds in the prostate. Other: Inflammatory changes with stranding and mesenteric edema in the right small bowel mesentery. Small amount of mottled extraluminal gas related small bowel diverticulitis. No abdominopelvic ascites. No focal or drainable collection. Fat containing bilateral inguinal hernias. Musculoskeletal: Thoracolumbar spine degenerative change. There are no acute or suspicious osseous abnormalities. IMPRESSION: 1. Recurrent acute small bowel diverticulitis in the right abdomen. Small amount of mottled extraluminal gas in the adjacent mesentery consistent with microperforation. No focal or drainable collection. 2. Regional small bowel ileus. 3. Colonic diverticulosis without diverticulitis. 4. Nonobstructing right  nephrolithiasis. 5. Subpleural reticular opacity and ground-glass in the right greater than left lower lobe, favoring post infectious/inflammatory sequela. Aortic Atherosclerosis (ICD10-I70.0). Critical Value/emergent results were called by telephone at the time of interpretation on 06/21/2021 at 8:28 pm to provider [facility] , who verbally acknowledged these results. Electronically Signed   By: Keith Rake M.D.   On: 06/21/2021 20:29   DG Chest 2 View  Result Date: 07/12/2021 CLINICAL DATA:  Chest pain, cough, and shortness of breath. EXAM: CHEST - 2 VIEW COMPARISON:  Chest x-ray dated July 09, 2021. FINDINGS: Stable cardiomediastinal silhouette. Chronically coarsened interstitial markings. Unchanged mild bibasilar atelectasis and small bilateral pleural effusions. No pneumothorax. No acute osseous abnormality. IMPRESSION: 1. Unchanged mild bibasilar atelectasis and small bilateral pleural effusions. Electronically Signed   By: Titus Dubin M.D.    On: 07/12/2021 11:20   DG Chest 2 View  Result Date: 07/09/2021 CLINICAL DATA:  Shortness of breath. History of COPD, hypertension pneumonia. Former smoker EXAM: CHEST - 2 VIEW COMPARISON:  None. FINDINGS: The heart is enlarged. Hyperinflated lungs with reticular opacities in the right middle lung as well as bilateral lung bases are unchanged. Bilateral pleural effusions, left greater than right with bibasilar atelectasis is also unchanged. Osteopenia with mild thoracic kyphosis. IMPRESSION: 1.  Stable cardiomegaly. 2. COPD. Reticular opacity in right middle lung as well as in bilateral lung bases with small bilateral pleural effusions concerning for pulmonary edema/infiltrate or chronic interstitial lung disease, not significantly changed. Electronically Signed   By: Keane Police D.O.   On: 07/09/2021 11:02   CT HEAD WO CONTRAST (5MM)  Result Date: 06/26/2021 CLINICAL DATA:  Mental status change, unknown cause EXAM: CT HEAD WITHOUT CONTRAST TECHNIQUE: Contiguous axial images were obtained from the base of the skull through the vertex without intravenous contrast. COMPARISON:  06/23/2021 FINDINGS: Brain: No evidence of acute infarction, hemorrhage, cerebral edema, mass, mass effect, or midline shift. Ventricles and sulci are within normal limits for age. No extra-axial fluid collection. Periventricular white matter changes, likely the sequela of chronic small vessel ischemic disease. Remote lacunar infarct in the left basal ganglia Vascular: No hyperdense vessel. Atherosclerotic calcifications in the intracranial carotid and vertebral arteries. Skull: Normal. Negative for fracture or focal lesion. Sinuses/Orbits: Mucosal thickening in the ethmoid air cells. Status post bilateral lens replacements. Other: The mastoid air cells are well aerated. IMPRESSION: No acute intracranial process. Electronically Signed   By: Merilyn Baba M.D.   On: 06/26/2021 18:15   CT HEAD WO CONTRAST (5MM)  Result Date:  06/23/2021 CLINICAL DATA:  Unwitnessed fall. EXAM: CT HEAD WITHOUT CONTRAST TECHNIQUE: Contiguous axial images were obtained from the base of the skull through the vertex without intravenous contrast. COMPARISON:  Head CT 03/07/2019 FINDINGS: Brain: Age related atrophy and chronic small vessel ischemia. No intracranial hemorrhage, mass effect, or midline shift. No hydrocephalus. The basilar cisterns are patent. No evidence of territorial infarct or acute ischemia. No extra-axial or intracranial fluid collection. Vascular: Atherosclerosis of skullbase vasculature without hyperdense vessel or abnormal calcification. Skull: No fracture or focal lesion. Sinuses/Orbits: Chronic opacification of right ethmoid air cells. Trace mucosal thickening in left side of sphenoid sinus. Bilateral cataract resection Other: No confluent scalp hematoma. IMPRESSION: 1. No acute intracranial abnormality. No skull fracture. 2. Age related atrophy and chronic small vessel ischemia. Electronically Signed   By: Keith Rake M.D.   On: 06/23/2021 22:35   US RENAL  Result Date: 06/25/2021 CLINICAL DATA:  Acute kidney insufficiency EXAM: RENAL / URINARY  TRACT ULTRASOUND COMPLETE COMPARISON:  CT abdomen 06/21/2021 FINDINGS: Right Kidney: Renal measurements: 10.7 x 5.1 x 5.5 cm = volume: 158 mL. Increased echogenicity with cortical thinning. 10 mm nonobstructing calculus in the upper pole. No hydronephrosis. Left Kidney: Renal measurements: 13.4 x 6 x 5.6 cm = volume: 236 mL. Increased echogenicity with cortical thinning in the upper and midpole. Anechoic cysts measuring up to 1.7 cm in the upper pole. No nephrolithiasis or hydronephrosis identified. Bladder: Appears normal for degree of bladder distention. Other: None. IMPRESSION: 1. Evidence of medical renal disease. 2. Right renal calculus. 3. Left renal cysts. Electronically Signed   By: Ofilia Neas M.D.   On: 06/25/2021 11:05   DG Chest Port 1 View  Result Date:  07/14/2021 CLINICAL DATA:  Difficulty breathing EXAM: PORTABLE CHEST 1 VIEW COMPARISON:  Previous studies including the examination of 07/12/2021 FINDINGS: Transverse diameter of heart is increased. Central pulmonary vessels are more prominent. Increased interstitial markings are seen in parahilar regions with interval worsening. There are linear densities and increased interstitial markings in both lower lung fields with interval worsening. There is blunting of left lateral CP angle. There is no pneumothorax. IMPRESSION: Increased interstitial markings are seen in the parahilar regions and lower lung fields with interval worsening suggesting pulmonary edema or bilateral pneumonia. Possible small left pleural effusion. Electronically Signed   By: Elmer Picker M.D.   On: 07/14/2021 15:30   DG Chest Port 1 View  Result Date: 07/05/2021 CLINICAL DATA:  Shortness of breath. EXAM: PORTABLE CHEST 1 VIEW COMPARISON:  Radiograph 06/30/2021, additional prior exams reviewed. FINDINGS: Unchanged cardiomegaly. Small pleural effusions, left greater than right, slightly increased. Patchy airspace disease in the right mid lung is not significantly changed. Diffuse interstitial opacity. No pneumothorax. Stable osseous structures. IMPRESSION: 1. Unchanged cardiomegaly. Diffuse pulmonary interstitial opacity may represent pulmonary edema. 2. Small pleural effusions, left greater than right, slightly increased. 3. Unchanged patchy airspace disease in the right mid lung, indeterminate for pneumonia or scarring. Electronically Signed   By: Keith Rake M.D.   On: 07/05/2021 18:25   DG Chest Port 1 View  Result Date: 06/30/2021 CLINICAL DATA:  Dyspnea R06.00 (ICD-10-CM) EXAM: PORTABLE CHEST 1 VIEW COMPARISON:  June 26, 2021. FINDINGS: Slightly increased conspicuity of patchy airspace opacity in the right midlung. No visible pleural effusions or pneumothorax. Similar enlarged cardiac silhouette. Degenerative  changes of the spine. IMPRESSION: 1. Slightly increased conspicuity of patchy airspace opacity in the right midlung, suspicious for pneumonia. Followup PA and lateral chest X-ray is recommended in 3-4 weeks following trial of antibiotic therapy to ensure resolution and exclude underlying malignancy. 2. Similar cardiomegaly. Electronically Signed   By: Margaretha Sheffield M.D.   On: 06/30/2021 11:43   DG Chest Port 1 View  Result Date: 06/26/2021 CLINICAL DATA:  Hypoxia. EXAM: PORTABLE CHEST 1 VIEW COMPARISON:  Prior chest radiographs 03/06/2020 and earlier. FINDINGS: Cardiomegaly, unchanged. Subtle ill-defined opacity within the right mid-lung. No appreciable airspace consolidation within the left lung. No evidence of pleural effusion or pneumothorax. No acute bony abnormality identified. Degenerative changes of the spine. IMPRESSION: Subtle ill-defined opacity within the right mid-lung, which may reflect scarring, atelectasis or early pneumonia. Clinical correlation is recommended. Additionally, short-interval radiographic follow-up is recommended. Cardiomegaly, unchanged. Electronically Signed   By: Kellie Simmering D.O.   On: 06/26/2021 11:09   DG Abd 2 Views  Result Date: 06/24/2021 CLINICAL DATA:  Abdominal distension EXAM: ABDOMEN - 2 VIEW COMPARISON:  06/21/2021 FINDINGS: Scattered large and small bowel  gas is noted. No significant dilatation is noted. Postsurgical changes are seen consistent with recent exploratory laparotomy. These findings are likely related to a postoperative ileus. No definitive free air is seen. No acute bony abnormality is noted. Prostate therapy seeds are seen. IMPRESSION: Changes consistent with mild postoperative ileus. Electronically Signed   By: Inez Catalina M.D.   On: 06/24/2021 21:26      ASSESSMENT/PLAN   Acute on chronic hypoxemic respiratory failure   Multifactorial in etiology - acutely he has post surgical central abdominal wound which is hindering respiratory  capacity. Additional comorbid conditions affecting his oxygenation which are currently being optimized include advanced COPD with OSA overlap syndrome and CKD. Besides this he does have interstitial edema, pleural effusions and atelectasis on current CXR done today.    Bibasilar atelectasis    - patient takes tidal volume of 1200 at baseline , currently at 750cc    - encouraged patient to continue IS at bedside and flutter valve   - he dose expectorate thickened phlegm - patient has mucomyst ordered 40ml 20% bid with RT   - Have added recrutment TID with RT utilizing Metaneb with Aluterol neb  Pleural effusions and Interstitial edema  -Suspect from CKD and post surgical physical stress    -LE edema is 1+   -patient is on diuretic with torsemide 100 daily , ill add aldactone 25 mg today and he has nephrologist following and diuretic regimen may be further adjusted  - electrolyte monitoring - K is lower , aldactone may help this as well. He is on repletion already.   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  Total face to face encounter time for this patient visit was > 80min. >50% of the time was  spent in counseling and coordination of care.   Patient/Family are satisfied with care plan and all questions have been answered.  This document was prepared using Dragon voice recognition software and may include  unintentional dictation errors.     Ottie Glazier, M.D.  Division of [place]

## 2021-07-15 NOTE — Progress Notes (Signed)
PROGRESS NOTE  AB LEAMING    DOB: 1926/02/27, 87 y.o.  RCV:[id number]  PCP: [name], MD   Code Status: DNR   DOA: 06/21/2021   LOS: 24  Brief Narrative of Current Hospitalization  Trevor Ferguson is a 87 y.o. male with a PMH significant for COPD, HTN, HLD, sleep apnea, CKD 3B, CVA. They presented from SNF to the ED on 06/21/2021 with abdominal pain x a few days. In the ED, it was found that they had small bowel diverticulitis with possible microperforation.  General surgery was consulted. 11/4 patient underwent exploratory laparotomy with small bowel resection 11/6 postoperative ileus 11/8 having bowel movements, 3 L O2 requirement, increased lethargy 11/10 O2 requirement 1-0C, metabolic acidosis improving with bicarb gtt, unable to tolerate PO 11/12 mental status improved. Metabolic acidosis resolved. Able to dc bicarb drip 11/26 seen by pulmonology, recs per consult note 07/15/21 -stable  Assessment & Plan  Active Problems:   COPD with acute exacerbation ([place])   Acute diverticulitis   AKI (acute kidney injury) ([place])   Diverticulitis   Hypoxia   Perforation of intestine due to diverticulitis of gastrointestinal tract   HAP (hospital-acquired pneumonia)   Abdominal distension (gaseous)   Pressure injury of skin   Dyspnea   Acute on chronic respiratory failure with hypoxia ([facility])   Acute diastolic congestive heart failure ([place])  Acute small bowel diverticulitis- s/p resection 58/5 complicated by postoperative ileus.  Now having normal Bms. Tolerating soft diet without abdominal pain or nausea. Possible that abdominal pain interfering with respirations. -Surgery following, appreciate recommendations -tolerating diet well. Continue soft diet until OP surgery follow up -Zosyn per general surgery (11/8-11/10), flagyl (11/4-11/8) - gentle bowel regimen, imodium PRN   Acute hypoxic respiratory failure 2/2 atelectasis versus early pneumonia  COPD - Patient endorses using  breathing treatment and on 2L O2 at baseline. Will likely need new higher requirement on discharge. Chest xray 11/21 showed signs consistent with fluid overload and unchanged on repeat cxray 11/24, 11/26.  WBC 9>17.5>8.5, likely to increase now restarted on steroids. - completed HAP treatment             - azithromycin (11/9-11/14)             - CTX (11/12-11/16) - Lasix drip transitioned to PO torsemide.  - anoro daily - pulmicort BID - wean to 2-5L as tolerated - PT/OT - flonase daily - pulmonology following, appreciate recs  Encephalopathy- resolved. Conversant and oriented today. - palliative consulted.              - meeting with family to discuss [place] 11/11- continue current course of treatment and dc with hospice/palliative to facility - continue PT/OT - attempt to get out of bed and limit night time interference to avoid delirium.    AKI on CKD IIIb  metabolic acidosis (resolved)  mild hypokalemia (resolved)- Cr 3.7>>>1.98>2.24. K+ 3.8 -Nephrology following, appreciate recommendations -Monitor serum bicarb/electrolytes/Cr daily, BMP am - replete electrolytes PRN   Primary HTN-well-controlled with baseline low diastolic readings -discontinuing amlodipine to help prevent vascular leaking -Holding home ACE inhibitor due to kidney function   Depression -Continue home Lexapro   GERD -Continue home PPI  Chronic iron deficiency anemia -Continue home iron  DVT prophylaxis: Place and maintain sequential compression device Start: 07/04/21 1447 heparin injection 5,000 Units Start: 06/27/21 0600   Diet:  Diet Orders (From admission, onward)     Start     Ordered   07/05/21 0000  Diet -  low sodium heart healthy        07/05/21 1257   06/30/21 1443  DIET SOFT Room service appropriate? Yes with Assist; Fluid consistency: Thin  Diet effective now       Comments: Please CUT the meats and add Gravy;  NO STRAWS!!! CREAM SOUPS ONLY  Question Answer Comment  Room service  appropriate? Yes with Assist   Fluid consistency: Thin      06/30/21 1443            Subjective 07/15/21    Pt undergoing pulmonary function testing at time of encounter. Will return later.   Disposition Plan & Communication  Patient status: Inpatient  Admitted From: SNF Disposition: Skilled nursing facility Anticipated discharge date: TBD, when can tolerate O2 stabilization around 5L for the SNF requirements  Family Communication: daughter via telephone Consults, Procedures, Significant Events  Consultants:  Nephrology General surgery Palliative  Pulmonology    Procedures/significant events:  Small bowel resection 11/4  Antimicrobials:  Anti-infectives (From admission, onward)    Start     Dose/Rate Route Frequency Ordered Stop   07/14/21 1630  azithromycin (ZITHROMAX) tablet 500 mg        500 mg Oral Daily 07/14/21 1536     06/30/21 1300  cefTRIAXone (ROCEPHIN) 2 g in sodium chloride 0.9 % 100 mL IVPB        2 g 200 mL/hr over 30 Minutes Intravenous Every 24 hours 06/30/21 1205 07/03/21 1615   06/27/21 1400  azithromycin (ZITHROMAX) 250 mg in dextrose 5 % 125 mL IVPB        250 mg 125 mL/hr over 60 Minutes Intravenous Every 24 hours 06/27/21 1239 07/01/21 1759   06/26/21 1800  piperacillin-tazobactam (ZOSYN) IVPB 2.25 g  Status:  Discontinued        2.25 g 100 mL/hr over 30 Minutes Intravenous Every 6 hours 06/26/21 1604 06/28/21 0748   06/22/21 2100  ceFEPIme (MAXIPIME) 2 g in sodium chloride 0.9 % 100 mL IVPB  Status:  Discontinued        2 g 200 mL/hr over 30 Minutes Intravenous Every 24 hours 06/21/21 2331 06/26/21 1604   06/22/21 0600  metroNIDAZOLE (FLAGYL) IVPB 500 mg  Status:  Discontinued        500 mg 100 mL/hr over 60 Minutes Intravenous Every 8 hours 06/21/21 2331 06/26/21 1604   06/21/21 2100  ceFEPIme (MAXIPIME) 2 g in sodium chloride 0.9 % 100 mL IVPB        2 g 200 mL/hr over 30 Minutes Intravenous  Once 06/21/21 2050 06/21/21 2130   06/21/21  2100  metroNIDAZOLE (FLAGYL) IVPB 500 mg        500 mg 100 mL/hr over 60 Minutes Intravenous  Once 06/21/21 2050 06/22/21 1616       Objective   Vitals:   07/14/21 1852 07/14/21 1929 07/14/21 2332 07/15/21 0452  BP: (!) 123/56 106/62 (!) 101/59 137/61  Pulse: 93 90 79 83  Resp: 18 18 14 18   Temp: 98.4 F (36.9 C) 97.6 F (36.4 C) 98.7 F (37.1 C) (!) 97.3 F (36.3 C)  TempSrc:  Oral    SpO2: 98% 96% 96% 95%  Weight:      Height:        Intake/Output Summary (Last 24 hours) at 07/15/2021 0707 Last data filed at 07/15/2021 0500 Gross per 24 hour  Intake 720 ml  Output 1300 ml  Net -580 ml    Filed Weights   06/22/21 2134 06/26/21  8416 06/27/21 0458  Weight: 96.2 kg 103.6 kg 104.4 kg    Patient BMI: Body mass index is 34.49 kg/m.   Physical Exam: General: awake, alert, NAD HEENT: atraumatic, clear conjunctiva, anicteric sclera, moist mucus membranes, hard of hearing Respiratory: normal respiratory effort. Cardiovascular:quick capillary refill  Gastrointestinal: soft, NT, ND, no HSM felt Nervous: Alert. no gross focal neurologic deficits Extremities: moves all equally, no edema, normal tone Skin: dry, intact, normal temperature, normal color, No rashes, lesions or ulcers Psychiatry: normal mood, congruent affect  Labs   CBC    Component Value Date/Time   WBC 8.5 07/15/2021 0538   RBC 3.01 (L) 07/15/2021 0538   HGB 9.4 (L) 07/15/2021 0538   HGB 12.8 (L) 03/19/2014 1049   HCT 28.2 (L) 07/15/2021 0538   HCT 37.7 (L) 03/19/2014 1049   PLT 179 07/15/2021 0538   PLT 137 (L) 03/19/2014 1049   MCV 93.7 07/15/2021 0538   MCV 97 03/19/2014 1049   MCH 31.2 07/15/2021 0538   MCHC 33.3 07/15/2021 0538   RDW 15.1 07/15/2021 0538   RDW 13.2 03/19/2014 1049   LYMPHSABS 0.5 (L) 06/27/2021 0334   MONOABS 1.6 (H) 06/27/2021 0334   EOSABS 0.1 06/27/2021 0334   BASOSABS 0.1 06/27/2021 0334   BMP Latest Ref Rng & Units 07/15/2021 07/14/2021 07/13/2021  Glucose 70 - 99  mg/dL 205(H) 133(H) 185(H)  BUN 8 - 23 mg/dL 94(H) 85(H) 74(H)  Creatinine 0.61 - 1.24 mg/dL 2.24(H) 1.88(H) 1.98(H)  Sodium 135 - 145 mmol/L 138 140 142  Potassium 3.5 - 5.1 mmol/L 3.8 3.4(L) 3.4(L)  Chloride 98 - 111 mmol/L 95(L) 95(L) 95(L)  CO2 22 - 32 mmol/L 33(H) 36(H) 40(H)  Calcium 8.9 - 10.3 mg/dL 8.2(L) 8.5(L) 8.5(L)    Imaging Studies  DG Chest Port 1 View  Result Date: 07/14/2021 CLINICAL DATA:  Difficulty breathing EXAM: PORTABLE CHEST 1 VIEW COMPARISON:  Previous studies including the examination of 07/12/2021 FINDINGS: Transverse diameter of heart is increased. Central pulmonary vessels are more prominent. Increased interstitial markings are seen in parahilar regions with interval worsening. There are linear densities and increased interstitial markings in both lower lung fields with interval worsening. There is blunting of left lateral CP angle. There is no pneumothorax. IMPRESSION: Increased interstitial markings are seen in the parahilar regions and lower lung fields with interval worsening suggesting pulmonary edema or bilateral pneumonia. Possible small left pleural effusion. Electronically Signed   By: Elmer Picker M.D.   On: 07/14/2021 15:30   Medications   Scheduled Meds:  acetylcysteine  4 mL Nebulization BID   acidophilus  2 capsule Oral TID   albuterol  2.5 mg Nebulization TID   azithromycin  500 mg Oral Daily   benzonatate  200 mg Oral TID   chlorpheniramine-HYDROcodone  5 mL Oral Q12H   cholecalciferol  1,000 Units Oral Daily   escitalopram  10 mg Oral Daily   famotidine  10 mg Oral BID   feeding supplement  237 mL Oral TID BM   ferrous sulfate  325 mg Oral Q breakfast   fluticasone  2 spray Each Nare Daily   guaiFENesin  1,200 mg Oral BID   heparin injection (subcutaneous)  5,000 Units Subcutaneous Q8H   mouth rinse  15 mL Mouth Rinse BID   methylPREDNISolone (SOLU-MEDROL) injection  40 mg Intravenous Q24H   multivitamin with minerals  1 tablet  Oral Daily   spironolactone  25 mg Oral Daily   torsemide  100 mg Oral Daily  traZODone  150 mg Oral QHS   umeclidinium-vilanterol  2 puff Inhalation Daily   No recently discontinued medications to reconcile  LOS: 24 days   Time spent: >44min  Zorianna Taliaferro L Taheerah Guldin, DO [facility] 07/15/2021, 7:07 AM   Please refer to amion to contact the [facility] Attending or Consulting provider for this pt  [email or web address] Available by Epic secure chat 7AM-7PM. If 7PM-7AM, please contact night-coverage

## 2021-07-15 NOTE — Evaluation (Signed)
Occupational Therapy Evaluation Patient Details Name: Trevor Ferguson MRN: [id number] DOB: 1926/02/22 Today's Date: 07/15/2021   History of Present Illness Patient is a 87 year old male with PMH of COPD, prostate cancer, HTN, sleep apnea, stage IIIB CKD and CVA. He presented to ER with acute onset of R lower quadrant abdominal pain with nausea and dry heaves.  Patient underwent exploratory laparotomy with small bowl resection on 06/22/21. Patient fell on 06/23/21 while in hospital. Pt also found to have acute hypoxic respiratory failure 2/2 atelectasis versus early pneumonia. Patient came from [facility] SNF   Clinical Impression   Pt seen for OT evaluation this date. Upon arrival to room, pt awake and seated upright in recliner on 6L/min of O2, with son-in-law and wife present. Pt agreeable to OT eval. Prior to admission, pt was residing at [facility], performing most ADLs and functional mobility MOD-I with RW (requires some assist for putting shoes on). Pt on 2 liters of O2 at home. Pt currently requires SET-UP A for seated grooming tasks, MIN A for seated LB dressing, MIN A for functional mobility of short household distances (71ft) with RW, and MIN GUARD for [facility] transfers due to current functional impairments (See OT Problem List below). Of note, SpO2 desat 84% following functional mobility while on 6L/min of supplemental O2, with SpO2 increasing to 92% following 90 sec of PLB; RN informed and pt left in recliner in no acute distress. Pt would benefit from additional skilled OT services to maximize return to PLOF and minimize risk of future falls, injury, caregiver burden, and readmission. Upon discharge, recommend SNF.         Recommendations for follow up therapy are one component of a multi-disciplinary discharge planning process, led by the attending physician.  Recommendations may be updated based on patient status, additional functional criteria and insurance authorization.   Follow Up  Recommendations  Skilled nursing-short term rehab (<3 hours/day)    Assistance Recommended at Discharge Intermittent Supervision/Assistance  Functional Status Assessment  Patient has had a recent decline in their functional status and demonstrates the ability to make significant improvements in function in a reasonable and predictable amount of time.  Equipment Recommendations  Other (comment) (defer to next venue of care)       Precautions / Restrictions Precautions Precautions: Fall Precaution Comments: check O2 Restrictions Weight Bearing Restrictions: No      Mobility Bed Mobility               General bed mobility comments: not assessed, pt in recliner at beginning/end of session    Transfers Overall transfer level: Modified independent Equipment used: Rolling Dyar (2 wheels) Transfers: Sit to/from Stand Sit to Stand: Min assist       General transfer comment: MIN A for steadying once upright      Balance Overall balance assessment: Needs assistance Sitting-balance support: Feet supported Sitting balance-Leahy Scale: Normal Sitting balance - Comments: Good sitting balance reaching within BOS for seated grooming tasks   Standing balance support: Bilateral upper extremity supported;During functional activity;Reliant on assistive device for balance Standing balance-Leahy Scale: Fair Standing balance comment: Pt with x2 LOB during functional mobility, requiring MIN A to steady                         ADL either performed or assessed with clinical judgement   ADL Overall ADL's : Needs assistance/impaired     Grooming: Wash/dry face;Oral care;Set up;Sitting  Lower Body Dressing: Minimal assistance;Sitting/lateral leans Lower Body Dressing Details (indicate cue type and reason): to don socks Toilet Transfer: Min guard;Ambulation;Rolling [place] (2 wheels);Grab bars           Functional mobility during ADLs: Minimal  assistance;Rolling [place] (2 wheels) (to walk 15 ft)       Vision Baseline Vision/History: 1 Wears glasses Ability to See in Adequate Light: 0 Adequate Patient Visual Report: No change from baseline              Pertinent Vitals/Pain Pain Assessment: No/denies pain Pain Intervention(s): Monitored during session     Hand Dominance Right   Extremity/Trunk Assessment Upper Extremity Assessment Upper Extremity Assessment: Generalized weakness   Lower Extremity Assessment Lower Extremity Assessment: Generalized weakness       Communication Communication Communication: No difficulties   Cognition Arousal/Alertness: Awake/alert Behavior During Therapy: WFL for tasks assessed/performed Overall Cognitive Status: Within Functional Limits for tasks assessed                                 General Comments: Pt alert and oriented to self, place, and situation. Wife and son-in-law confirming pt reported PLOF     General Comments  SpO2 desat 84% following functional mobility of short household distances (15 ft) with RW while on 6L/min of supplemental O2. SpO2 increased to 92% following 90 sec of PLB. RN informed and pt left in recliner in no acute distress            Home Living Family/patient expects to be discharged to:: Skilled nursing facility                                 Additional Comments: Patient resides at [facility] and uses 2L of supplemental O2 at baseline      Prior Functioning/Environment Prior Level of Function : Needs assist       Physical Assist : Mobility (physical);ADLs (physical) Mobility (physical): Stairs;Gait ADLs (physical): Dressing Mobility Comments: Patient reports he walks with RW at baseline for short and long distances. ADLs Comments: Needs assistance with putting shoes and socks on. Independent with all other ADLs        OT Problem List: Decreased strength;Decreased activity tolerance;Impaired balance  (sitting and/or standing);Cardiopulmonary status limiting activity      OT Treatment/Interventions: Self-care/ADL training;Therapeutic exercise;Energy conservation;Therapeutic activities;Patient/family education;Balance training    OT Goals(Current goals can be found in the care plan section) Acute Rehab OT Goals Patient Stated Goal: to return to [facility] SNF OT Goal Formulation: With patient/family Time For Goal Achievement: 07/29/21 Potential to Achieve Goals: Good ADL Goals Pt Will Perform Grooming: with min assist;standing (performing x1 standing grooming task) Pt Will Perform Upper Body Bathing: with set-up;sitting (while implementing at least one energy conservation strategy) Pt Will Perform Lower Body Bathing: with min assist;sit to/from stand  OT Frequency: Min 2X/week    AM-PAC OT "6 Clicks" Daily Activity     Outcome Measure Help from another person eating meals?: None Help from another person taking care of personal grooming?: A Little Help from another person toileting, which includes using toliet, bedpan, or urinal?: A Lot Help from another person bathing (including washing, rinsing, drying)?: A Lot Help from another person to put on and taking off regular upper body clothing?: A Little Help from another person to put on and taking off regular  lower body clothing?: A Lot 6 Click Score: 16   End of Session Equipment Utilized During Treatment: Rolling [place] (2 wheels);Oxygen Nurse Communication: Mobility status;Other (comment) (SpO2 following functional mobility)  Activity Tolerance: Patient tolerated treatment well Patient left: in chair;with call bell/phone within reach;with chair alarm set;with family/visitor present  OT Visit Diagnosis: Unsteadiness on feet (R26.81);Muscle weakness (generalized) (M62.81)                Time: 0076-2263 OT Time Calculation (min): 30 min Charges:  OT General Charges $OT Visit: 1 Visit OT Evaluation $OT Eval Moderate Complexity: 1  Mod OT Treatments $Therapeutic Activity: 8-22 mins  Fredirick Maudlin, OTR/L [place]

## 2021-07-15 NOTE — Progress Notes (Signed)
[facility]  PROGRESS NOTE   Subjective:   Patient seen sitting up in chair, just recently ambulated with physical therapy.  Currently short of breath from activity.  Currently on 6 L nasal cannula.  Edema improved  Recorded urine output of 1.3 L in preceding 24 hours. Creatinine elevated 2.2  Objective:  Vital signs in last 24 hours:  Temp:  [97.3 F (36.3 C)-98.7 F (37.1 C)] 98.2 F (36.8 C) (11/27 1429) Pulse Rate:  [79-93] 83 (11/27 1429) Resp:  [14-18] 17 (11/27 1429) BP: (101-137)/(47-62) 113/52 (11/27 1429) SpO2:  [90 %-100 %] 90 % (11/27 1429) FiO2 (%):  [95 %-97 %] 97 % (11/27 1200)  Weight change:  Filed Weights   06/22/21 2134 06/26/21 1549 06/27/21 0458  Weight: 96.2 kg 103.6 kg 104.4 kg    Intake/Output: I/O last 3 completed shifts: In: 38 [P.O.:720] Out: 2801 [Urine:2800; Stool:1]   Intake/Output this shift:  Total I/O In: 720 [P.O.:720] Out: 500 [Urine:500]  Physical Exam: General:  No acute distress, resting in bed  Head:  Normocephalic, atraumatic. Moist oral mucosal membranes  Eyes:  Anicteric  Lungs:    decreased breath sounds, mild crackles at bases, O2 6L  Heart:  regular  Abdomen:   +midline incision with staples  Extremities:  1+ peripheral edema.  Neurologic:  Awake, alert, following commands  Skin:  No lesions       Basic Metabolic Panel: Recent Labs  Lab 07/10/21 0805 07/12/21 0639 07/13/21 0447 07/14/21 0539 07/15/21 0538  NA 140 140 142 140 138  K 4.3 3.6 3.4* 3.4* 3.8  CL 100 95* 95* 95* 95*  CO2 31 36* 40* 36* 33*  GLUCOSE 184* 133* 185* 133* 205*  BUN 91* 79* 74* 85* 94*  CREATININE 1.92* 2.05* 1.98* 1.88* 2.24*  CALCIUM 8.3* 8.8* 8.5* 8.5* 8.2*     CBC: Recent Labs  Lab 07/11/21 0355 07/13/21 0447 07/15/21 0538  WBC 9.0 17.5* 8.5  HGB 9.0* 9.6* 9.4*  HCT 27.0* 28.8* 28.2*  MCV 92.5 93.5 93.7  PLT 200 213 179      Urinalysis: No results for input(s): COLORURINE, LABSPEC, PHURINE,  GLUCOSEU, HGBUR, BILIRUBINUR, KETONESUR, PROTEINUR, UROBILINOGEN, NITRITE, LEUKOCYTESUR in the last 72 hours.  Invalid input(s): APPERANCEUR    Imaging: DG Chest Port 1 View  Result Date: 07/14/2021 CLINICAL DATA:  Difficulty breathing EXAM: PORTABLE CHEST 1 VIEW COMPARISON:  Previous studies including the examination of 07/12/2021 FINDINGS: Transverse diameter of heart is increased. Central pulmonary vessels are more prominent. Increased interstitial markings are seen in parahilar regions with interval worsening. There are linear densities and increased interstitial markings in both lower lung fields with interval worsening. There is blunting of left lateral CP angle. There is no pneumothorax. IMPRESSION: Increased interstitial markings are seen in the parahilar regions and lower lung fields with interval worsening suggesting pulmonary edema or bilateral pneumonia. Possible small left pleural effusion. Electronically Signed   By: Elmer Picker M.D.   On: 07/14/2021 15:30     Medications:    sodium chloride 10 mL/hr at 06/26/21 1905    acetylcysteine  4 mL Nebulization BID   acidophilus  2 capsule Oral TID   albuterol  2.5 mg Nebulization TID   azithromycin  500 mg Oral Daily   benzonatate  200 mg Oral TID   chlorpheniramine-HYDROcodone  5 mL Oral Q12H   cholecalciferol  1,000 Units Oral Daily   escitalopram  10 mg Oral Daily   famotidine  10 mg Oral BID  feeding supplement  237 mL Oral TID BM   ferrous sulfate  325 mg Oral Q breakfast   fluticasone  2 spray Each Nare Daily   guaiFENesin  1,200 mg Oral BID   heparin injection (subcutaneous)  5,000 Units Subcutaneous Q8H   mouth rinse  15 mL Mouth Rinse BID   methylPREDNISolone (SOLU-MEDROL) injection  40 mg Intravenous Q24H   multivitamin with minerals  1 tablet Oral Daily   spironolactone  25 mg Oral Daily   torsemide  100 mg Oral Daily   traZODone  150 mg Oral QHS   umeclidinium-vilanterol  2 puff Inhalation Daily     Assessment/ Plan:     Active Problems:   COPD with acute exacerbation ([place])   Acute diverticulitis   AKI (acute kidney injury) ([place])   Diverticulitis   Hypoxia   Perforation of intestine due to diverticulitis of gastrointestinal tract   HAP (hospital-acquired pneumonia)   Abdominal distension (gaseous)   Pressure injury of skin   Dyspnea   Acute on chronic respiratory failure with hypoxia ([facility])   Acute diastolic congestive heart failure ([facility])   Pulmonary edema  Trevor Ferguson is a 87 y.o. white male with hypertension, coronary artery disease, hyperlipidemia, COPD, obstructive sleep apnea, who is admitted to [facility] on 06/21/2021 for Diverticulitis [K57.92] AKI (acute kidney injury) ([place]) [N17.9] Acute diverticulitis [K57.92] Perforation of intestine due to diverticulitis of gastrointestinal tract [K57.80]  Patient underwent Exlap with bowel resection on 06/22/2021.   Acute kidney injury on chronic kidney disease stage IIIB with proteinuria: baseline creatinine of 1.5, GFR of 43 on 07/05/21. Acute kidney injury secondary to prerenal azotemia and ATN. Nonoliguric urine output.  No indication for dialysis.   - Creatinine elevated today.  Will monitor with transition from IV to oral diuretics. -Will require follow-up with nephrology 1 to 2 weeks after discharge  Anemia of chronic kidney disease: hemoglobin 9.4, normocytic. No indication for ESA. History of iron deficiency.   - Continue ferrous sulfate.  -Stable  Hypertension with chronic kidney disease:  - Continue losartan and amlodipine -BP stable at 136/81   4.  Lower extremity edema.    Patient responding well with improved fluid status Continue torsemide 100mg  dialy  5.  Metabolic alkalosis likely due to IV diuresis VBG shows pH of 7.52 with PCO2 at 46 Acetazolamide given CO2 decreasing to 33 today Continue oral diuretics    LOS: [facility] 11/27/20223:44 PM

## 2021-07-15 NOTE — Progress Notes (Signed)
Physical Therapy Treatment Patient Details Name: Trevor Ferguson MRN: [id number] DOB: 27-Feb-1926 Today's Date: 07/15/2021   History of Present Illness Patient is a 87 year old male with PMH of COPD, prostate cancer, HTN, sleep apnea, stage IIIB CKD and CVA. He presented to ER with acute onset of R lower quadrant abdominal pain with nausea and dry heaves.  Patient underwent exploratory laparotomy with small bowl resection on 06/22/21. Patient fell on 06/23/21 while in hospital. Pt also found to have acute hypoxic respiratory failure 2/2 atelectasis versus early pneumonia. Patient came from [facility]    PT Comments    Patient received in recliner. Family just left. He is received on 4 liters via [place], O2 sats in upper 90%s. Patient requires no assist to stand from recliner but upon standing sats dropped to 88% on 4 liters. Increased O2 to 6 then 8 liters to bring sats above 90%. He was able to ambulate 100 feet with RW and min guard. Upon returning to room, sats down to 79% on 8 liters. Upon sitting down his sats quickly increased to 90%s and he was left in recliner on 4 liters at 94% saturation. Patient continues to be limited by his O2 needs/saturation with mobility. He will continue to benefit from skilled PT while here to improve activity tolerance, strength and safety.      Recommendations for follow up therapy are one component of a multi-disciplinary discharge planning process, led by the attending physician.  Recommendations may be updated based on patient status, additional functional criteria and insurance authorization.  Follow Up Recommendations  Skilled nursing-short term rehab (<3 hours/day)     Assistance Recommended at Discharge Frequent or constant Supervision/Assistance  Equipment Recommendations  None recommended by PT    Recommendations for Other Services       Precautions / Restrictions Precautions Precautions: Fall Precaution Comments: check O2 Restrictions Weight  Bearing Restrictions: No     Mobility  Bed Mobility               General bed mobility comments: not assessed, pt in recliner at beginning/end of session    Transfers Overall transfer level: Modified independent Equipment used: Rolling Bartell (2 wheels) Transfers: Sit to/from Stand Sit to Stand: Modified independent (Device/Increase time)     Step pivot transfers: Min assist     General transfer comment: patient standing before all lines set up correctly. Needed no physical assist    Ambulation/Gait Ambulation/Gait assistance: Min guard Gait Distance (Feet): 100 Feet Assistive device: Rolling Mikels (2 wheels) Gait Pattern/deviations: Step-through pattern;Decreased step length - right;Decreased step length - left;Narrow base of support;Shuffle Gait velocity: decreased     General Gait Details: Patient's O2 sats at 88% on 4 liters upon standing. Increased O2 to 8 liters and he remained in 90%s for the first 30 feet, but by the time he returned to room and recliner sats were reading 79%. Once seated sats improved very quickly back into mid 90%s. Requires cues to breathe in through his nose. He tends to be a mouth breather.   Stairs             Wheelchair Mobility    Modified Rankin (Stroke Patients Only)       Balance Overall balance assessment: Needs assistance Sitting-balance support: Feet supported Sitting balance-Leahy Scale: Normal Sitting balance - Comments: Good sitting balance reaching within BOS for seated grooming tasks   Standing balance support: Bilateral upper extremity supported;During functional activity;Reliant on assistive device for balance  Standing balance-Leahy Scale: Fair Standing balance comment: No lob with ambulation, however patient kept stpping on his sock which was causing patient to stumble slightly.                            Cognition Arousal/Alertness: Awake/alert Behavior During Therapy: WFL for tasks  assessed/performed Overall Cognitive Status: Within Functional Limits for tasks assessed                                 General Comments: Pt alert and oriented to self, place, and situation. Wife and son-in-law confirming pt reported PLOF        Exercises      General Comments General comments (skin integrity, edema, etc.): SpO2 desat 84% following functional mobility of short household distances (15 ft) with RW while on 6L/min of supplemental O2. SpO2 increased to 92% following 90 sec of PLB. RN informed and pt left in recliner in no acute distress      Pertinent Vitals/Pain Pain Assessment: No/denies pain Pain Intervention(s): Monitored during session    Home Living Family/patient expects to be discharged to:: Skilled nursing facility                   Additional Comments: Patient resides at [facility] and uses 2L of supplemental O2 at baseline    Prior Function            PT Goals (current goals can now be found in the care plan section) Acute Rehab PT Goals Patient Stated Goal: to return back to SNF ( [facility]) PT Goal Formulation: With patient Time For Goal Achievement: 07/23/21 Potential to Achieve Goals: Good Progress towards PT goals: Progressing toward goals    Frequency    Min 2X/week      PT Plan Current plan remains appropriate    Co-evaluation              AM-PAC PT "6 Clicks" Mobility   Outcome Measure  Help needed turning from your back to your side while in a flat bed without using bedrails?: A Little Help needed moving from lying on your back to sitting on the side of a flat bed without using bedrails?: A Little Help needed moving to and from a bed to a chair (including a wheelchair)?: A Little Help needed standing up from a chair using your arms (e.g., wheelchair or bedside chair)?: None Help needed to walk in hospital room?: A Little Help needed climbing 3-5 steps with a railing? : A Lot 6 Click Score:  18    End of Session Equipment Utilized During Treatment: Gait belt;Oxygen Activity Tolerance: Patient limited by fatigue Patient left: in chair;with chair alarm set;with call bell/phone within reach Nurse Communication: Mobility status PT Visit Diagnosis: Unsteadiness on feet (R26.81);Other abnormalities of gait and mobility (R26.89);Repeated falls (R29.6);Muscle weakness (generalized) (M62.81);Difficulty in walking, not elsewhere classified (R26.2);History of falling (Z91.81)     Time: 2376-2831 PT Time Calculation (min) (ACUTE ONLY): 26 min  Charges:  $Gait Training: 23-37 mins                     Nalu Troublefield, PT, GCS 07/15/21,12:54 PM

## 2021-07-16 ENCOUNTER — Inpatient Hospital Stay: Admit: 2021-07-16 | Payer: Medicare PPO

## 2021-07-16 ENCOUNTER — Inpatient Hospital Stay (HOSPITAL_COMMUNITY)
Admit: 2021-07-16 | Discharge: 2021-07-16 | Disposition: A | Discharge status: Home / Self Care | Payer: Medicare PPO | Attending: Nephrology | Admitting: Nephrology

## 2021-07-16 DIAGNOSIS — R0609 Other forms of dyspnea: Secondary | ICD-10-CM

## 2021-07-16 DIAGNOSIS — J810 Acute pulmonary edema: Secondary | ICD-10-CM

## 2021-07-16 LAB — BASIC METABOLIC PANEL
Anion gap: 12 (ref 5–15)
BUN: 102 mg/dL — ABNORMAL HIGH (ref 8–23)
CO2: 32 mmol/L (ref 22–32)
Calcium: 8.3 mg/dL — ABNORMAL LOW (ref 8.9–10.3)
Chloride: 92 mmol/L — ABNORMAL LOW (ref 98–111)
Creatinine, Ser: 2.47 mg/dL — ABNORMAL HIGH (ref 0.61–1.24)
GFR, Estimated: 24 mL/min — ABNORMAL LOW (ref >60)
Glucose, Bld: 256 mg/dL — ABNORMAL HIGH (ref 70–99)
Potassium: 4.2 mmol/L (ref 3.5–5.1)
Sodium: 136 mmol/L (ref 135–145)

## 2021-07-16 LAB — ECHOCARDIOGRAM COMPLETE
AR max vel: 1.24 cm2
AV Area VTI: 1.33 cm2
AV Area mean vel: 1.26 cm2
AV Mean grad: 6 mmHg
AV Peak grad: 11.3 mmHg
Ao pk vel: 1.68 m/s
Area-P 1/2: 2.16 cm2
Height: 68.5 in
MV VTI: 0.86 cm2
S' Lateral: 2.8 cm
Weight: 3682.56 oz

## 2021-07-16 MED ORDER — PREDNISONE 20 MG PO TABS
30.0000 mg | ORAL_TABLET | Freq: Every day | ORAL | Status: DC
  Administered 2021-07-17: 30 mg via ORAL
  Filled 2021-07-16: qty 1

## 2021-07-16 MED ORDER — METOLAZONE 5 MG PO TABS
5.0000 mg | ORAL_TABLET | ORAL | Status: DC
  Administered 2021-07-16 – 2021-07-18 (×2): 5 mg via ORAL
  Filled 2021-07-16 (×2): qty 1

## 2021-07-16 NOTE — Progress Notes (Signed)
Pulmonary Medicine          Date: 07/16/2021,   MRN# [id number] Trevor Ferguson 25-Dec-1925     AdmissionWeight: 94.3 kg                 CurrentWeight: 104.4 kg  Referring physician: Dr Ouida Sills    CHIEF COMPLAINT:   Increased O2 requirement    HISTORY OF PRESENT ILLNESS   This is a pleasant 87 yo M with hx of Adanced copd on O2, Smoked 1.5ppd, quit 1944-1987, HTN, OSA, CKD3, CVA, came in due to abd with NVD. On admission he had acute on chronic renal impairment with leukocytosis.  He underwent abd CT on admission with findings of recurrent acute diverticulitis with microperforation, right nephrolithiasis.  He has been treated with antimicrobials and has surgical eval s/p ex lap and resection and has been having increased O2 requirement. PCCM consultation to optimize respiratory status for DC planning. CT chest from 11/8 and recent serial CXRs reviewed with findings of interstitial edema and mild pleural effusion worse on right.   07/15/21- patient improved. Ow weaned to 3.5L.  PT/OT in progress. Continue metaneb as previous. Mild AKI not concerning continue diuresis. Will dc tussinex , mucomyst, solumedrol, pulmicort.  Keeping oxycontin for analgesia, steroids with prednisone PO.    07/16/21- patient is now on home O2 setting 2.5L/min.  He is improved from pulmonary perspective.  I have reduced steroids to 30mg  pred with plan to further wean.   PAST MEDICAL HISTORY   Past Medical History:  Diagnosis Date   COPD (chronic obstructive pulmonary disease) ([place])    Hypertension    Pneumonia    2012,1985 hospitalized   Prostate cancer ([place])    with seed placement   Sleep apnea    noncompliant, dx 1996   Stroke ([place]) 2012   no residuals     SURGICAL HISTORY   Past Surgical History:  Procedure Laterality Date   adenoid     APPENDECTOMY     BOWEL RESECTION N/A 06/22/2021   Procedure: SMALL BOWEL RESECTION;  Surgeon: Herbert Pun, MD;  Location: [facility]  ORS;  Service: General;  Laterality: N/A;   COLONOSCOPY WITH PROPOFOL N/A 01/28/2021   Procedure: COLONOSCOPY WITH PROPOFOL;  Surgeon: Lucilla Lame, MD;  Location: [facility] ENDOSCOPY;  Service: Endoscopy;  Laterality: N/A;  GI bleed   ENTEROSCOPY N/A 01/30/2021   Procedure: ENTEROSCOPY;  Surgeon: Lin Landsman, MD;  Location: [facility] ENDOSCOPY;  Service: Gastroenterology;  Laterality: N/A;   ESOPHAGOGASTRODUODENOSCOPY (EGD) WITH PROPOFOL N/A 01/27/2021   Procedure: ESOPHAGOGASTRODUODENOSCOPY (EGD) WITH PROPOFOL;  Surgeon: Lucilla Lame, MD;  Location: [facility] ENDOSCOPY;  Service: Endoscopy;  Laterality: N/A;   GIVENS CAPSULE STUDY N/A 01/28/2021   Procedure: GIVENS CAPSULE STUDY;  Surgeon: Lin Landsman, MD;  Location: [facility] ENDOSCOPY;  Service: Gastroenterology;  Laterality: N/A;   HEMORRHOID SURGERY     LAPAROTOMY N/A 06/22/2021   Procedure: EXPLORATORY LAPAROTOMY;  Surgeon: Herbert Pun, MD;  Location: [facility] ORS;  Service: General;  Laterality: N/A;   spine cyst     TONSILLECTOMY       FAMILY HISTORY   Family History  Problem Relation Age of Onset   Congestive Heart Failure Mother    COPD Sister    Asthma Sister    Tuberculosis Maternal Grandmother    Stroke Maternal Grandfather      SOCIAL HISTORY   Social History   Tobacco Use   Smoking status: Former    Years: 40.00  Types: Cigarettes    Quit date: 08/19/1985    Years since quitting: 35.9   Smokeless tobacco: Never  Vaping Use   Vaping Use: Never used  Substance Use Topics   Alcohol use: Yes    Alcohol/week: 14.0 standard drinks    Types: 14 Shots of liquor per week   Drug use: No     MEDICATIONS    Home Medication:  Current Outpatient Rx   Order #: [id number] Class: No Print   Order #: [id number] Class: No Print   Order #: [id number] Class: Print    Current Medication:  Current Facility-Administered Medications:    0.9 %  sodium chloride infusion, , Intravenous, PRN, Oswald Hillock, MD, Last Rate: 10  mL/hr at 06/26/21 1905, New Bag at 06/26/21 1905   acetaminophen (TYLENOL) tablet 650 mg, 650 mg, Oral, Q6H PRN, 650 mg at 07/06/21 1152 **OR** acetaminophen (TYLENOL) suppository 650 mg, 650 mg, Rectal, Q6H PRN, Darrick Meigs, Marge Duncans, MD   acidophilus (RISAQUAD) capsule 2 capsule, 2 capsule, Oral, TID, Darrick Meigs, Marge Duncans, MD, 2 capsule at 07/16/21 1711   albuterol (PROVENTIL) (2.5 MG/3ML) 0.083% nebulizer solution 2.5 mg, 2.5 mg, Nebulization, TID, Lanney Gins, Aviana Shevlin, MD, 2.5 mg at 07/16/21 1321   azithromycin (ZITHROMAX) tablet 500 mg, 500 mg, Oral, Daily, Lanney Gins, Ksean Vale, MD, 500 mg at 07/16/21 8916   cholecalciferol (VITAMIN D3) tablet 1,000 Units, 1,000 Units, Oral, Daily, Oswald Hillock, MD, 1,000 Units at 07/16/21 9450   escitalopram (LEXAPRO) tablet 10 mg, 10 mg, Oral, Daily, Darrick Meigs, Gagan S, MD, 10 mg at 07/16/21 3888   famotidine (PEPCID) tablet 10 mg, 10 mg, Oral, BID, Darrick Meigs, Gagan S, MD, 10 mg at 07/16/21 2800   feeding supplement (ENSURE ENLIVE / ENSURE PLUS) liquid 237 mL, 237 mL, Oral, TID BM, Darrick Meigs, Gagan S, MD, 237 mL at 07/16/21 1418   ferrous sulfate tablet 325 mg, 325 mg, Oral, Q breakfast, Darrick Meigs, Gagan S, MD, 325 mg at 07/16/21 0921   fluticasone (FLONASE) 50 MCG/ACT nasal spray 2 spray, 2 spray, Each Nare, Daily, Mansy, Jan A, MD, 2 spray at 07/16/21 0923   guaiFENesin (MUCINEX) 12 hr tablet 1,200 mg, 1,200 mg, Oral, BID, Wyvonnia Dusky, MD, 1,200 mg at 07/16/21 3491   heparin injection 5,000 Units, 5,000 Units, Subcutaneous, Q8H, Darrick Meigs, Marge Duncans, MD, 5,000 Units at 07/16/21 1416   MEDLINE mouth rinse, 15 mL, Mouth Rinse, BID, Darrick Meigs, Marge Duncans, MD, 15 mL at 07/16/21 7915   menthol-cetylpyridinium (CEPACOL) lozenge 3 mg, 1 lozenge, Oral, PRN, Darrick Meigs, Marge Duncans, MD   metolazone (ZAROXOLYN) tablet 5 mg, 5 mg, Oral, Once per day on Mon Wed Fri, Breeze, Shantelle, NP, 5 mg at 07/16/21 1416   multivitamin with minerals tablet 1 tablet, 1 tablet, Oral, Daily, Oswald Hillock, MD, 1 tablet at 07/16/21 0569    oxyCODONE-acetaminophen (PERCOCET/ROXICET) 5-325 MG per tablet 1 tablet, 1 tablet, Oral, Q6H PRN, Oswald Hillock, MD, 1 tablet at 06/30/21 0956   [START ON 07/17/2021] predniSONE (DELTASONE) tablet 30 mg, 30 mg, Oral, Q breakfast, Manoah Deckard, MD   simethicone (MYLICON) chewable tablet 80 mg, 80 mg, Oral, QID PRN, Oswald Hillock, MD, 80 mg at 06/24/21 1508   SM Double Antibiotic 500-10000 UNIT/GM OINT 1 application, 1 application, Topical, BID, Mansy, Jan A, MD, 1 application at 79/48/01 0926   sodium chloride (OCEAN) 0.65 % nasal spray 1 spray, 1 spray, Each Nare, PRN, Oswald Hillock, MD, 1 spray at 07/06/21 0959   torsemide (DEMADEX) tablet 100 mg, 100  mg, Oral, Daily, Breeze, [place], NP, 100 mg at 07/16/21 9735   traZODone (DESYREL) tablet 150 mg, 150 mg, Oral, QHS, Darrick Meigs, Gagan S, MD, 150 mg at 07/15/21 2100   umeclidinium-vilanterol (ANORO ELLIPTA) 62.5-25 MCG/ACT 2 puff, 2 puff, Inhalation, Daily, Richarda Osmond, MD, 2 puff at 07/16/21 3299    ALLERGIES   Penicillins, Bee venom, and Darvon [propoxyphene]     REVIEW OF SYSTEMS    Review of Systems:  Gen:  Denies  fever, sweats, chills weigh loss  HEENT: Denies blurred vision, double vision, ear pain, eye pain, hearing loss, nose bleeds, sore throat Cardiac:  No dizziness, chest pain or heaviness, chest tightness,edema Resp:   Denies cough or sputum porduction, shortness of breath,wheezing, hemoptysis,  Gi: Denies swallowing difficulty, stomach pain, nausea or vomiting, diarrhea, constipation, bowel incontinence Gu:  Denies bladder incontinence, burning urine Ext:   Denies Joint pain, stiffness or swelling Skin: Denies  skin rash, easy bruising or bleeding or hives Endoc:  Denies polyuria, polydipsia , polyphagia or weight change Psych:   Denies depression, insomnia or hallucinations   Other:  All other systems negative   VS: BP (!) 124/105 (BP Location: Left Arm)   Pulse 73   Temp 97.7 F (36.5 C) (Oral)   Resp  18   Ht 5' 8.5" (1.74 m)   Wt 104.4 kg   SpO2 95%   BMI 34.49 kg/m      PHYSICAL EXAM    GENERAL:NAD, no fevers, chills, no weakness no fatigue HEAD: Normocephalic, atraumatic.  EYES: Pupils equal, round, reactive to light. Extraocular muscles intact. No scleral icterus.  MOUTH: Moist mucosal membrane. Dentition intact. No abscess noted.  EAR, NOSE, THROAT: Clear without exudates. No external lesions.  NECK: Supple. No thyromegaly. No nodules. No JVD.  PULMONARY: Right lower lobe crackles with decreased air entry bilaterally  CARDIOVASCULAR: S1 and S2. Regular rate and rhythm. No murmurs, rubs, or gallops. No edema. Pedal pulses 2+ bilaterally.  GASTROINTESTINAL: Soft, nontender, nondistended. No masses. Positive bowel sounds. No hepatosplenomegaly.  MUSCULOSKELETAL: No swelling, clubbing, or edema. Range of motion full in all extremities.  NEUROLOGIC: Cranial nerves II through XII are intact. No gross focal neurological deficits. Sensation intact. Reflexes intact.  SKIN: No ulceration, lesions, rashes, or cyanosis. Skin warm and dry. Turgor intact.  PSYCHIATRIC: Mood, affect within normal limits. The patient is awake, alert and oriented x 3. Insight, judgment intact.       IMAGING    CT ABDOMEN PELVIS WO CONTRAST  Result Date: 06/26/2021 CLINICAL DATA:  Status post small bowel resection for small bowel diverticulitis. Worsening clinical condition. EXAM: CT ABDOMEN AND PELVIS WITHOUT CONTRAST TECHNIQUE: Multidetector CT imaging of the abdomen and pelvis was performed following the standard protocol without IV contrast. COMPARISON:  06/21/2021 FINDINGS: Lower chest: Minimal dependent atelectasis or infiltrate in both lungs with tiny right effusion. Hepatobiliary: No focal abnormality in the liver on this study without intravenous contrast. Gallbladder is markedly distended. No intrahepatic or extrahepatic biliary dilation. Pancreas: No focal mass lesion. No dilatation of the main  duct. No intraparenchymal cyst. No peripancreatic edema. Spleen: No splenomegaly. No focal mass lesion. Adrenals/Urinary Tract: No adrenal nodule or mass. Right kidney unremarkable. 2 cm low-density lesion interpolar left kidney is stable in the interval, likely a cyst. No evidence for hydroureter. The urinary bladder appears normal for the degree of distention. Stomach/Bowel: Stomach is unremarkable. No gastric wall thickening. No evidence of outlet obstruction. Duodenum is normally positioned as is the ligament of  Treitz. Duodenal diverticulum noted. No small bowel wall thickening. No small bowel dilatation. Left abdominal anastomosis is unremarkable without evidence of wall thickening, pneumatosis, or adjacent fluid/extraluminal gas. No small bowel dilatation. The terminal ileum is normal. Right colon is mildly distended with gas and fluid. Left colon is nondilated. Diverticular change noted in the left colon without diverticulitis. Vascular/Lymphatic: There is moderate atherosclerotic calcification of the abdominal aorta without aneurysm. There is no gastrohepatic or hepatoduodenal ligament lymphadenopathy. No retroperitoneal or mesenteric lymphadenopathy. No pelvic sidewall lymphadenopathy. Reproductive: Brachytherapy seeds noted in the prostate gland. Other: No intraperitoneal free fluid.  No intraperitoneal free air. Musculoskeletal: Diffuse body wall edema. No worrisome lytic or sclerotic osseous abnormality. IMPRESSION: 1. Left abdominal small bowel anastomosis is unremarkable. No evidence for wall thickening, pneumatosis, or adjacent fluid/extraluminal gas. No free fluid in the peritoneal cavity. No small bowel dilatation. 2. Marked gallbladder distension. 3. Right colon is mildly distended with gas and fluid. Imaging features are nonspecific. 4. Left colonic diverticulosis without diverticulitis. 5. Diffuse body wall edema. 6. Aortic Atherosclerosis (ICD10-I70.0). Electronically Signed   By: Misty Stanley  M.D.   On: 06/26/2021 18:28   CT ABDOMEN PELVIS WO CONTRAST  Result Date: 06/21/2021 CLINICAL DATA:  87 year old with abdominal distension and right lower quadrant pain EXAM: CT ABDOMEN AND PELVIS WITHOUT CONTRAST TECHNIQUE: Multidetector CT imaging of the abdomen and pelvis was performed following the standard protocol without IV contrast. COMPARISON:  CT 03/10/2020 FINDINGS: Lower chest: Subpleural reticular opacity and ground-glass in the right greater than left lower lobe. Resolution of previous pleural effusions. Hepatobiliary: No focal hepatic abnormality on this unenhanced exam. Unremarkable gallbladder. No calcified gallstone. No biliary dilatation. Pancreas: Age related fatty atrophy No ductal dilatation or inflammation. Spleen: Normal in size without focal abnormality. Adrenals/Urinary Tract: Normal adrenal glands. No hydronephrosis. Small upper pole right renal calculus. Small left renal cyst. Unremarkable urinary bladder. Stomach/Bowel: Decompressed stomach. Fluid-filled duodenal diverticulum without inflammation. There is no inflamed small bowel diverticula in the right abdomen, series 5, image 32 hand series 2, image 56. Adjacent fluid-filled small bowel and mesenteric edema. Small amount of mottled gas in the adjacent mesentery, series 2, image 54. This does not appear to be within the mesenteric vasculature. There is no small bowel pneumatosis. Regional small bowel are mildly prominent fluid-filled suggesting regional ileus. Fluid within the cecum and ascending colon. There is formed stool in the more distal colon. Descending and sigmoid colonic diverticulosis without colonic diverticulitis. Vascular/Lymphatic: Aortic atherosclerosis. No aortic aneurysm. There is no portal venous gas. No abdominopelvic adenopathy. Reproductive: Brachytherapy seeds in the prostate. Other: Inflammatory changes with stranding and mesenteric edema in the right small bowel mesentery. Small amount of mottled  extraluminal gas related small bowel diverticulitis. No abdominopelvic ascites. No focal or drainable collection. Fat containing bilateral inguinal hernias. Musculoskeletal: Thoracolumbar spine degenerative change. There are no acute or suspicious osseous abnormalities. IMPRESSION: 1. Recurrent acute small bowel diverticulitis in the right abdomen. Small amount of mottled extraluminal gas in the adjacent mesentery consistent with microperforation. No focal or drainable collection. 2. Regional small bowel ileus. 3. Colonic diverticulosis without diverticulitis. 4. Nonobstructing right nephrolithiasis. 5. Subpleural reticular opacity and ground-glass in the right greater than left lower lobe, favoring post infectious/inflammatory sequela. Aortic Atherosclerosis (ICD10-I70.0). Critical Value/emergent results were called by telephone at the time of interpretation on 06/21/2021 at 8:28 pm to provider [facility] , who verbally acknowledged these results. Electronically Signed   By: Keith Rake M.D.   On: 06/21/2021 20:29  DG Chest 2 View  Result Date: 07/12/2021 CLINICAL DATA:  Chest pain, cough, and shortness of breath. EXAM: CHEST - 2 VIEW COMPARISON:  Chest x-ray dated July 09, 2021. FINDINGS: Stable cardiomediastinal silhouette. Chronically coarsened interstitial markings. Unchanged mild bibasilar atelectasis and small bilateral pleural effusions. No pneumothorax. No acute osseous abnormality. IMPRESSION: 1. Unchanged mild bibasilar atelectasis and small bilateral pleural effusions. Electronically Signed   By: Titus Dubin M.D.   On: 07/12/2021 11:20   DG Chest 2 View  Result Date: 07/09/2021 CLINICAL DATA:  Shortness of breath. History of COPD, hypertension pneumonia. Former smoker EXAM: CHEST - 2 VIEW COMPARISON:  None. FINDINGS: The heart is enlarged. Hyperinflated lungs with reticular opacities in the right middle lung as well as bilateral lung bases are unchanged. Bilateral pleural  effusions, left greater than right with bibasilar atelectasis is also unchanged. Osteopenia with mild thoracic kyphosis. IMPRESSION: 1.  Stable cardiomegaly. 2. COPD. Reticular opacity in right middle lung as well as in bilateral lung bases with small bilateral pleural effusions concerning for pulmonary edema/infiltrate or chronic interstitial lung disease, not significantly changed. Electronically Signed   By: Keane Police D.O.   On: 07/09/2021 11:02   CT HEAD WO CONTRAST (5MM)  Result Date: 06/26/2021 CLINICAL DATA:  Mental status change, unknown cause EXAM: CT HEAD WITHOUT CONTRAST TECHNIQUE: Contiguous axial images were obtained from the base of the skull through the vertex without intravenous contrast. COMPARISON:  06/23/2021 FINDINGS: Brain: No evidence of acute infarction, hemorrhage, cerebral edema, mass, mass effect, or midline shift. Ventricles and sulci are within normal limits for age. No extra-axial fluid collection. Periventricular white matter changes, likely the sequela of chronic small vessel ischemic disease. Remote lacunar infarct in the left basal ganglia Vascular: No hyperdense vessel. Atherosclerotic calcifications in the intracranial carotid and vertebral arteries. Skull: Normal. Negative for fracture or focal lesion. Sinuses/Orbits: Mucosal thickening in the ethmoid air cells. Status post bilateral lens replacements. Other: The mastoid air cells are well aerated. IMPRESSION: No acute intracranial process. Electronically Signed   By: Merilyn Baba M.D.   On: 06/26/2021 18:15   CT HEAD WO CONTRAST (5MM)  Result Date: 06/23/2021 CLINICAL DATA:  Unwitnessed fall. EXAM: CT HEAD WITHOUT CONTRAST TECHNIQUE: Contiguous axial images were obtained from the base of the skull through the vertex without intravenous contrast. COMPARISON:  Head CT 03/07/2019 FINDINGS: Brain: Age related atrophy and chronic small vessel ischemia. No intracranial hemorrhage, mass effect, or midline shift. No  hydrocephalus. The basilar cisterns are patent. No evidence of territorial infarct or acute ischemia. No extra-axial or intracranial fluid collection. Vascular: Atherosclerosis of skullbase vasculature without hyperdense vessel or abnormal calcification. Skull: No fracture or focal lesion. Sinuses/Orbits: Chronic opacification of right ethmoid air cells. Trace mucosal thickening in left side of sphenoid sinus. Bilateral cataract resection Other: No confluent scalp hematoma. IMPRESSION: 1. No acute intracranial abnormality. No skull fracture. 2. Age related atrophy and chronic small vessel ischemia. Electronically Signed   By: Keith Rake M.D.   On: 06/23/2021 22:35   US RENAL  Result Date: 06/25/2021 CLINICAL DATA:  Acute kidney insufficiency EXAM: RENAL / URINARY TRACT ULTRASOUND COMPLETE COMPARISON:  CT abdomen 06/21/2021 FINDINGS: Right Kidney: Renal measurements: 10.7 x 5.1 x 5.5 cm = volume: 158 mL. Increased echogenicity with cortical thinning. 10 mm nonobstructing calculus in the upper pole. No hydronephrosis. Left Kidney: Renal measurements: 13.4 x 6 x 5.6 cm = volume: 236 mL. Increased echogenicity with cortical thinning in the upper and midpole. Anechoic cysts measuring  up to 1.7 cm in the upper pole. No nephrolithiasis or hydronephrosis identified. Bladder: Appears normal for degree of bladder distention. Other: None. IMPRESSION: 1. Evidence of medical renal disease. 2. Right renal calculus. 3. Left renal cysts. Electronically Signed   By: Ofilia Neas M.D.   On: 06/25/2021 11:05   DG Chest Port 1 View  Result Date: 07/14/2021 CLINICAL DATA:  Difficulty breathing EXAM: PORTABLE CHEST 1 VIEW COMPARISON:  Previous studies including the examination of 07/12/2021 FINDINGS: Transverse diameter of heart is increased. Central pulmonary vessels are more prominent. Increased interstitial markings are seen in parahilar regions with interval worsening. There are linear densities and increased  interstitial markings in both lower lung fields with interval worsening. There is blunting of left lateral CP angle. There is no pneumothorax. IMPRESSION: Increased interstitial markings are seen in the parahilar regions and lower lung fields with interval worsening suggesting pulmonary edema or bilateral pneumonia. Possible small left pleural effusion. Electronically Signed   By: Elmer Picker M.D.   On: 07/14/2021 15:30   DG Chest Port 1 View  Result Date: 07/05/2021 CLINICAL DATA:  Shortness of breath. EXAM: PORTABLE CHEST 1 VIEW COMPARISON:  Radiograph 06/30/2021, additional prior exams reviewed. FINDINGS: Unchanged cardiomegaly. Small pleural effusions, left greater than right, slightly increased. Patchy airspace disease in the right mid lung is not significantly changed. Diffuse interstitial opacity. No pneumothorax. Stable osseous structures. IMPRESSION: 1. Unchanged cardiomegaly. Diffuse pulmonary interstitial opacity may represent pulmonary edema. 2. Small pleural effusions, left greater than right, slightly increased. 3. Unchanged patchy airspace disease in the right mid lung, indeterminate for pneumonia or scarring. Electronically Signed   By: Keith Rake M.D.   On: 07/05/2021 18:25   DG Chest Port 1 View  Result Date: 06/30/2021 CLINICAL DATA:  Dyspnea R06.00 (ICD-10-CM) EXAM: PORTABLE CHEST 1 VIEW COMPARISON:  June 26, 2021. FINDINGS: Slightly increased conspicuity of patchy airspace opacity in the right midlung. No visible pleural effusions or pneumothorax. Similar enlarged cardiac silhouette. Degenerative changes of the spine. IMPRESSION: 1. Slightly increased conspicuity of patchy airspace opacity in the right midlung, suspicious for pneumonia. Followup PA and lateral chest X-ray is recommended in 3-4 weeks following trial of antibiotic therapy to ensure resolution and exclude underlying malignancy. 2. Similar cardiomegaly. Electronically Signed   By: Margaretha Sheffield M.D.    On: 06/30/2021 11:43   DG Chest Port 1 View  Result Date: 06/26/2021 CLINICAL DATA:  Hypoxia. EXAM: PORTABLE CHEST 1 VIEW COMPARISON:  Prior chest radiographs 03/06/2020 and earlier. FINDINGS: Cardiomegaly, unchanged. Subtle ill-defined opacity within the right mid-lung. No appreciable airspace consolidation within the left lung. No evidence of pleural effusion or pneumothorax. No acute bony abnormality identified. Degenerative changes of the spine. IMPRESSION: Subtle ill-defined opacity within the right mid-lung, which may reflect scarring, atelectasis or early pneumonia. Clinical correlation is recommended. Additionally, short-interval radiographic follow-up is recommended. Cardiomegaly, unchanged. Electronically Signed   By: Kellie Simmering D.O.   On: 06/26/2021 11:09   DG Abd 2 Views  Result Date: 06/24/2021 CLINICAL DATA:  Abdominal distension EXAM: ABDOMEN - 2 VIEW COMPARISON:  06/21/2021 FINDINGS: Scattered large and small bowel gas is noted. No significant dilatation is noted. Postsurgical changes are seen consistent with recent exploratory laparotomy. These findings are likely related to a postoperative ileus. No definitive free air is seen. No acute bony abnormality is noted. Prostate therapy seeds are seen. IMPRESSION: Changes consistent with mild postoperative ileus. Electronically Signed   By: Inez Catalina M.D.   On: 06/24/2021 21:26  ECHOCARDIOGRAM COMPLETE  Result Date: 07/16/2021    ECHOCARDIOGRAM REPORT   Patient Name:   Trevor Ferguson Date of Exam: 07/16/2021 Medical Rec #:  [id number]       Height:       68.5 in Accession #:    [id number]      Weight:       230.2 lb Date of Birth:  1925/10/20       BSA:          2.181 m Patient Age:    39 years        BP:           122/70 mmHg Patient Gender: M               HR:           85 bpm. Exam Location:  [facility] Procedure: 2D Echo, Cardiac Doppler and Color Doppler Indications:     Dyspnea R06.00  History:         Patient has prior history of  Echocardiogram examinations, most                  recent 03/02/2020. COPD and Stroke; Risk Factors:Hypertension                  and Sleep Apnea. Pneumonia.  Sonographer:     Sherrie Sport Referring Phys:  [place] Diagnosing Phys: Kathlyn Sacramento MD  Sonographer Comments: Suboptimal apical window. IMPRESSIONS  1. Left ventricular ejection fraction, by estimation, is 60 to 65%. The left ventricle has normal function. The left ventricle has no regional wall motion abnormalities. There is mild left ventricular hypertrophy. Left ventricular diastolic parameters are indeterminate.  2. Right ventricular systolic function is normal. The right ventricular size is normal.  3. Left atrial size was mild to moderately dilated.  4. Right atrial size was mild to moderately dilated.  5. The mitral valve is normal in structure. No evidence of mitral valve regurgitation. No evidence of mitral stenosis.  6. The aortic valve is normal in structure. Aortic valve regurgitation is not visualized. Aortic valve sclerosis/calcification is present, without any evidence of aortic stenosis. FINDINGS  Left Ventricle: Left ventricular ejection fraction, by estimation, is 60 to 65%. The left ventricle has normal function. The left ventricle has no regional wall motion abnormalities. The left ventricular internal cavity size was normal in size. There is  mild left ventricular hypertrophy. Left ventricular diastolic parameters are indeterminate. Right Ventricle: The right ventricular size is normal. No increase in right ventricular wall thickness. Right ventricular systolic function is normal. Left Atrium: Left atrial size was mild to moderately dilated. Right Atrium: Right atrial size was mild to moderately dilated. Pericardium: There is no evidence of pericardial effusion. Mitral Valve: The mitral valve is normal in structure. No evidence of mitral valve regurgitation. No evidence of mitral valve stenosis. MV peak gradient, 6.4 mmHg. The  mean mitral valve gradient is 3.0 mmHg. Tricuspid Valve: The tricuspid valve is normal in structure. Tricuspid valve regurgitation is trivial. No evidence of tricuspid stenosis. Aortic Valve: The aortic valve is normal in structure. Aortic valve regurgitation is not visualized. Aortic valve sclerosis/calcification is present, without any evidence of aortic stenosis. Aortic valve mean gradient measures 6.0 mmHg. Aortic valve peak  gradient measures 11.3 mmHg. Aortic valve area, by VTI measures 1.33 cm. Pulmonic Valve: The pulmonic valve was normal in structure. Pulmonic valve regurgitation is not visualized. No evidence of pulmonic stenosis. Aorta: The aortic root  is normal in size and structure. Venous: The inferior vena cava was not well visualized. IAS/Shunts: No atrial level shunt detected by color flow Doppler.  LEFT VENTRICLE PLAX 2D LVIDd:         4.40 cm   Diastology LVIDs:         2.80 cm   LV e' medial:    7.62 cm/s LV PW:         1.30 cm   LV E/e' medial:  12.0 LV IVS:        1.05 cm   LV e' lateral:   7.07 cm/s LVOT diam:     2.00 cm   LV E/e' lateral: 12.9 LV SV:         41 LV SV Index:   19 LVOT Area:     3.14 cm  RIGHT VENTRICLE RV Basal diam:  4.30 cm RV S prime:     19.90 cm/s TAPSE (M-mode): 5.3 cm LEFT ATRIUM             Index        RIGHT ATRIUM           Index LA diam:        3.70 cm 1.70 cm/m   RA Area:     28.50 cm LA Vol (A2C):   98.9 ml 45.34 ml/m  RA Volume:   94.80 ml  43.46 ml/m LA Vol (A4C):   79.3 ml 36.35 ml/m LA Biplane Vol: 93.6 ml 42.91 ml/m  AORTIC VALVE                     PULMONIC VALVE AV Area (Vmax):    1.24 cm      PV Vmax:        1.02 m/s AV Area (Vmean):   1.26 cm      PV Vmean:       72.450 cm/s AV Area (VTI):     1.33 cm      PV VTI:         0.228 m AV Vmax:           168.33 cm/s   PV Peak grad:   4.2 mmHg AV Vmean:          107.567 cm/s  PV Mean grad:   2.0 mmHg AV VTI:            0.308 m       RVOT Peak grad: 6 mmHg AV Peak Grad:      11.3 mmHg AV Mean Grad:       6.0 mmHg LVOT Vmax:         66.60 cm/s LVOT Vmean:        43.100 cm/s LVOT VTI:          0.131 m LVOT/AV VTI ratio: 0.42  AORTA Ao Root diam: 3.17 cm MITRAL VALVE                TRICUSPID VALVE MV Area (PHT): 2.16 cm     TR Peak grad:   22.8 mmHg MV Area VTI:   0.86 cm     TR Vmax:        239.00 cm/s MV Peak grad:  6.4 mmHg MV Mean grad:  3.0 mmHg     SHUNTS MV Vmax:       1.26 m/s     Systemic VTI:  0.13 m MV Vmean:      75.4 cm/s    Systemic Diam:  2.00 cm MV Decel Time: 351 msec     Pulmonic VTI:  0.220 m MV E velocity: 91.30 cm/s MV A velocity: 107.00 cm/s MV E/A ratio:  0.85 Kathlyn Sacramento MD Electronically signed by Kathlyn Sacramento MD Signature Date/Time: 07/16/2021/4:10:42 PM    Final       ASSESSMENT/PLAN   Acute on chronic hypoxemic respiratory failure   Multifactorial in etiology - acutely he has post surgical central abdominal wound which is hindering respiratory capacity. Additional comorbid conditions affecting his oxygenation which are currently being optimized include advanced COPD with OSA overlap syndrome and CKD. Besides this he does have interstitial edema, pleural effusions and atelectasis on current CXR done today.    Bibasilar atelectasis    - c/w incentive spirometry   - encouraged patient to continue IS at bedside and flutter valve   - dcd mucomyst    - Have added recrutment TID with RT utilizing Metaneb with Aluterol neb  Pleural effusions and Interstitial edema  -Suspect from CKD and post surgical physical stress    -LE edema is 1+   -dcd aldactone - appreciate nephrology   Chronic COPD with signs of mild to moderate exacerbation         -steroids down to pred 30 , dc abx today     Diverticulitis s/p ex-Lap   -Large midline surgical scar with development of restrictive physiology    -patient is on PRN narcotics   - this is likely to reduce his vital capacity and we will perform spirometry with graph to review current lung function       Thank you for  allowing me to participate in the care of this patient.  Total face to face encounter time for this patient visit was > 26min. >50% of the time was  spent in counseling and coordination of care.   Patient/Family are satisfied with care plan and all questions have been answered.  This document was prepared using Dragon voice recognition software and may include unintentional dictation errors.     Ottie Glazier, M.D.  Division of [facility]

## 2021-07-16 NOTE — Progress Notes (Signed)
Patient ID: Trevor Ferguson, male   DOB: Aug 09, 1926, 87 y.o.   MRN: [id number]     [facility] Day(s): 25.   Interval History: Patient seen and examined, no acute events or new complaints overnight. Patient reports feeling well.  He feels more comfortable.  He denies shortness of breath.  He denies abdominal pain.  He is tolerating diet without any problems.  Vital signs in last 24 hours: [min-max] current  Temp:  [97.2 F (36.2 C)-98.3 F (36.8 C)] 98.2 F (36.8 C) (11/28 0740) Pulse Rate:  [77-88] 85 (11/28 0740) Resp:  [16-20] 20 (11/28 0740) BP: (113-124)/(52-70) 122/70 (11/28 0740) SpO2:  [90 %-97 %] 97 % (11/28 0815)     Height: 5' 8.5" (174 cm) Weight: 104.4 kg BMI (Calculated): 34.48   Physical Exam:  Constitutional: alert, cooperative and no distress  Respiratory: breathing non-labored at rest  Cardiovascular: regular rate and sinus rhythm  Gastrointestinal: soft, non-tender, and non-distended.  Wound is healing well  Labs:  CBC Latest Ref Rng & Units 07/15/2021 07/13/2021 07/11/2021  WBC 4.0 - 10.5 K/uL 8.5 17.5(H) 9.0  Hemoglobin 13.0 - 17.0 g/dL 9.4(L) 9.6(L) 9.0(L)  Hematocrit 39.0 - 52.0 % 28.2(L) 28.8(L) 27.0(L)  Platelets 150 - 400 K/uL 179 213 200   CMP Latest Ref Rng & Units 07/16/2021 07/15/2021 07/14/2021  Glucose 70 - 99 mg/dL 256(H) 205(H) 133(H)  BUN 8 - 23 mg/dL 102(H) 94(H) 85(H)  Creatinine 0.61 - 1.24 mg/dL 2.47(H) 2.24(H) 1.88(H)  Sodium 135 - 145 mmol/L 136 138 140  Potassium 3.5 - 5.1 mmol/L 4.2 3.8 3.4(L)  Chloride 98 - 111 mmol/L 92(L) 95(L) 95(L)  CO2 22 - 32 mmol/L 32 33(H) 36(H)  Calcium 8.9 - 10.3 mg/dL 8.3(L) 8.2(L) 8.5(L)  Total Protein 6.5 - 8.1 g/dL - - -  Total Bilirubin 0.3 - 1.2 mg/dL - - -  Alkaline Phos 38 - 126 U/L - - -  AST 15 - 41 U/L - - -  ALT 0 - 44 U/L - - -    Imaging studies: No new pertinent imaging studies   Assessment/Plan:  87 y.o. male with diverticulitis of small intestine with  perforation 24 Day Post-Op s/p small bowel resection and anastomosis, complicated by pertinent comorbidities including COPD, hypertension, stage IIIb chronic kidney disease, history of CVA, sleep apnea.  Patient doing well from surgical standpoint.  He continue tolerating diet.  He is having bowel movement.  When he started including.  Continue medical management as per primary team and pulmonology.  I will continue to follow intermittently while patient is in the hospital.  Arnold Long, MD

## 2021-07-16 NOTE — Care Management Important Message (Signed)
Important Message  Patient Details  Name: Trevor Ferguson MRN: [id number] Date of Birth: 1926/03/29   Medicare Important Message Given:  Yes     Dannette Barbara 07/16/2021, 12:38 PM

## 2021-07-16 NOTE — Progress Notes (Signed)
At rest sat 96% on 2L. Once patient was ambulated in room, from bed to hallway door multiple times on 6L, sat stayed around 92%. Patient very SOB with exertion.

## 2021-07-16 NOTE — Progress Notes (Addendum)
[facility]  PROGRESS NOTE   Subjective:   Patient sitting up in chair, alert and oriented Denies shortness of breath, currently on nasal cannula at 3 L  Creatinine continues to increase 2.5 today Urine output of 950 mL recorded in past 24 hours  Objective:  Vital signs in last 24 hours:  Temp:  [97.2 F (36.2 C)-98.3 F (36.8 C)] 98.2 F (36.8 C) (11/28 0740) Pulse Rate:  [77-88] 85 (11/28 0740) Resp:  [16-20] 20 (11/28 0740) BP: (113-124)/(52-70) 122/70 (11/28 0740) SpO2:  [90 %-97 %] 92 % (11/28 1323)  Weight change:  Filed Weights   06/22/21 2134 06/26/21 1549 06/27/21 0458  Weight: 96.2 kg 103.6 kg 104.4 kg    Intake/Output: I/O last 3 completed shifts: In: 1200 [P.O.:1200] Out: 1550 [Urine:1550]   Intake/Output this shift:  Total I/O In: 360 [P.O.:360] Out: 600 [Urine:600]  Physical Exam: General:  No acute distress, resting in bed  Head:  Normocephalic, atraumatic. Moist oral mucosal membranes  Eyes:  Anicteric  Lungs:    wheeze at bases, O2 3L  Heart:  regular  Abdomen:   +midline incision with staples  Extremities:  2+ peripheral edema.  Neurologic:  Awake, alert, following commands  Skin:  No lesions       Basic Metabolic Panel: Recent Labs  Lab 07/12/21 0639 07/13/21 0447 07/14/21 0539 07/15/21 0538 07/16/21 0558  NA 140 142 140 138 136  K 3.6 3.4* 3.4* 3.8 4.2  CL 95* 95* 95* 95* 92*  CO2 36* 40* 36* 33* 32  GLUCOSE 133* 185* 133* 205* 256*  BUN 79* 74* 85* 94* 102*  CREATININE 2.05* 1.98* 1.88* 2.24* 2.47*  CALCIUM 8.8* 8.5* 8.5* 8.2* 8.3*     CBC: Recent Labs  Lab 07/11/21 0355 07/13/21 0447 07/15/21 0538  WBC 9.0 17.5* 8.5  HGB 9.0* 9.6* 9.4*  HCT 27.0* 28.8* 28.2*  MCV 92.5 93.5 93.7  PLT 200 213 179      Urinalysis: No results for input(s): COLORURINE, LABSPEC, PHURINE, GLUCOSEU, HGBUR, BILIRUBINUR, KETONESUR, PROTEINUR, UROBILINOGEN, NITRITE, LEUKOCYTESUR in the last 72 hours.  Invalid input(s):  APPERANCEUR    Imaging: DG Chest Port 1 View  Result Date: 07/14/2021 CLINICAL DATA:  Difficulty breathing EXAM: PORTABLE CHEST 1 VIEW COMPARISON:  Previous studies including the examination of 07/12/2021 FINDINGS: Transverse diameter of heart is increased. Central pulmonary vessels are more prominent. Increased interstitial markings are seen in parahilar regions with interval worsening. There are linear densities and increased interstitial markings in both lower lung fields with interval worsening. There is blunting of left lateral CP angle. There is no pneumothorax. IMPRESSION: Increased interstitial markings are seen in the parahilar regions and lower lung fields with interval worsening suggesting pulmonary edema or bilateral pneumonia. Possible small left pleural effusion. Electronically Signed   By: Elmer Picker M.D.   On: 07/14/2021 15:30     Medications:    sodium chloride 10 mL/hr at 06/26/21 1905    acidophilus  2 capsule Oral TID   albuterol  2.5 mg Nebulization TID   azithromycin  500 mg Oral Daily   cholecalciferol  1,000 Units Oral Daily   escitalopram  10 mg Oral Daily   famotidine  10 mg Oral BID   feeding supplement  237 mL Oral TID BM   ferrous sulfate  325 mg Oral Q breakfast   fluticasone  2 spray Each Nare Daily   guaiFENesin  1,200 mg Oral BID   heparin injection (subcutaneous)  5,000 Units Subcutaneous  Q8H   mouth rinse  15 mL Mouth Rinse BID   metolazone  5 mg Oral Once per day on Mon Wed Fri   multivitamin with minerals  1 tablet Oral Daily   predniSONE  40 mg Oral Q breakfast   SM Double Antibiotic  1 application Topical BID   spironolactone  25 mg Oral Daily   torsemide  100 mg Oral Daily   traZODone  150 mg Oral QHS   umeclidinium-vilanterol  2 puff Inhalation Daily    Assessment/ Plan:     Active Problems:   COPD with acute exacerbation ([place])   Acute diverticulitis   AKI (acute kidney injury) ([place])   Diverticulitis   Hypoxia   Perforation  of intestine due to diverticulitis of gastrointestinal tract   HAP (hospital-acquired pneumonia)   Abdominal distension (gaseous)   Pressure injury of skin   Dyspnea   Acute on chronic respiratory failure with hypoxia ([facility])   Acute diastolic congestive heart failure ([facility])   Pulmonary edema  Trevor Ferguson is a 87 y.o. white male with hypertension, coronary artery disease, hyperlipidemia, COPD, obstructive sleep apnea, who is admitted to [facility] on 06/21/2021 for Diverticulitis [K57.92] AKI (acute kidney injury) ([place]) [N17.9] Acute diverticulitis [K57.92] Perforation of intestine due to diverticulitis of gastrointestinal tract [K57.80]  Patient underwent Exlap with bowel resection on 06/22/2021.   Acute kidney injury on chronic kidney disease stage IIIB with proteinuria: baseline creatinine of 1.5, GFR of 43 on 07/05/21. Acute kidney injury secondary to prerenal azotemia and ATN. Nonoliguric urine output.  No indication for dialysis.   -Creatinine continues to increase.  Edema slightly worse today.  Will prescribe metolazone 5 mg 3 times weekly. -Will require follow-up with nephrology 1 to 2 weeks after discharge  Anemia of chronic kidney disease: hemoglobin 9.4, normocytic. No indication for ESA. History of iron deficiency.   - Continue ferrous sulfate.  -Stable  Hypertension with chronic kidney disease:  - Continue losartan and amlodipine -BP stable at 136/81   4.  Lower extremity edema.    Patient responding well with improved fluid status Continue torsemide 100mg  dialy  5.  Metabolic alkalosis likely due to IV diuresis VBG shows pH of 7.52 with PCO2 at 46 Acetazolamide given CO2 decreasing to 33 today Continue oral diuretics  6.  Chronic diastolic heart failure Echo in June 2021 shows EF 60 to 65%.  Based on increasing creatinine and edema, will obtain updated echo to evaluate cardiac   LOS: [facility] 11/28/20221:43 PM

## 2021-07-16 NOTE — Plan of Care (Signed)

## 2021-07-16 NOTE — Progress Notes (Signed)
*  PRELIMINARY RESULTS* Echocardiogram 2D Echocardiogram has been performed.  Trevor Ferguson 07/16/2021, 2:13 PM

## 2021-07-16 NOTE — TOC Progression Note (Signed)
Transition of Care [facility]) - Progression Note    Patient Details  Name: REINO LYBBERT MRN: [id number] Date of Birth: [birth year]  Transition of Care [facility]) CM/SW [place], [place] Phone Number: 07/16/2021, 2:41 PM  Clinical Narrative:     CSW spoke with Albina Billet with admissions at [facility], she confirms patient will need to be at 6L or below for oxygen needs to return to them for LTC.   Currently as of yesterday it is noted patient needed 8L of O2 during ambulation with PT.   CSW has reached out to PT and RN to see if we can continue to work on weaning down to at least 6L on ambulation.     Expected Discharge Plan: Long Term Nursing Home Barriers to Discharge: Continued Medical Work up  Expected Discharge Plan and Services Expected Discharge Plan: [place]       Living arrangements for the past 2 months: [place] Expected Discharge Date: 07/05/21                                     Social Determinants of Health (SDOH) Interventions    Readmission Risk Interventions Readmission Risk Prevention Plan 06/23/2021 01/29/2021  Transportation Screening Complete Complete  PCP or Specialist Appt within 3-5 Days Complete Complete  HRI or Home Care Consult Complete Complete  Social Work Consult for [place] Planning/Counseling Complete Complete  Palliative Care Screening Not Applicable Not Applicable  Medication Review [employer] photographer) Complete Complete  Some recent data might be hidden

## 2021-07-16 NOTE — Progress Notes (Signed)
PROGRESS NOTE  Trevor Ferguson    DOB: 09/01/1925, 87 y.o.  BMW:[id number]  PCP: [name], MD   Code Status: DNR   DOA: 06/21/2021   LOS: 25  Brief Narrative of Current Hospitalization  Trevor Ferguson is a 87 y.o. male with a PMH significant for COPD, HTN, HLD, sleep apnea, CKD 3B, CVA. They presented from SNF to the ED on 06/21/2021 with abdominal pain x a few days. In the ED, it was found that they had small bowel diverticulitis with possible microperforation.  General surgery was consulted. 11/4 patient underwent exploratory laparotomy with small bowel resection 11/6 postoperative ileus 11/8 having bowel movements, 3 L O2 requirement, increased lethargy 11/10 O2 requirement 2-7O, metabolic acidosis improving with bicarb gtt, unable to tolerate PO 11/12 mental status improved. Metabolic acidosis resolved. Able to dc bicarb drip 11/26 seen by pulmonology, recs per consult note 07/16/21 -stable  Assessment & Plan  Active Problems:   COPD with acute exacerbation ([place])   Acute diverticulitis   AKI (acute kidney injury) ([place])   Diverticulitis   Hypoxia   Perforation of intestine due to diverticulitis of gastrointestinal tract   HAP (hospital-acquired pneumonia)   Abdominal distension (gaseous)   Pressure injury of skin   Dyspnea   Acute on chronic respiratory failure with hypoxia ([facility])   Acute diastolic congestive heart failure ([place])   Pulmonary edema  Acute small bowel diverticulitis- s/p resection 53/6 complicated by postoperative ileus.  Now having normal Bms. Tolerating soft diet without abdominal pain or nausea. Possible that abdominal pain interfering with respirations. -Surgery following, appreciate recommendations -tolerating diet well. Continue soft diet until OP surgery follow up -Zosyn per general surgery (11/8-11/10), flagyl (11/4-11/8) - gentle bowel regimen, imodium PRN   Acute hypoxic respiratory failure 2/2 atelectasis versus early pneumonia  COPD -  Patient endorses using breathing treatment and on 2L O2 at baseline. Will likely need new higher requirement on discharge. Chest xray 11/21 showed signs consistent with fluid overload and unchanged on repeat cxray 11/24, 11/26.  WBC 9>17.5>8.5, likely to increase now restarted on steroids. - completed HAP treatment             - azithromycin (11/9-11/14)             - CTX (11/12-11/16) - Lasix drip transitioned to PO torsemide + aldactone + metolazone - PT/OT - flonase daily - pulmonology following, appreciate recs  - restarted steroids and azithromycin for COPD exacerbation - anoro, flonase  Encephalopathy- resolved. Conversant and oriented today. - palliative consulted.              - meeting with family to discuss [place] 11/11- continue current course of treatment and dc with hospice/palliative to facility - continue PT/OT - attempt to get out of bed and limit night time interference to avoid delirium.    AKI on CKD IIIb  metabolic acidosis (resolved)  mild hypokalemia (resolved)- Cr 3.7>>>1.98>2.47. K+ 4.2 -Nephrology following, appreciate recommendations -Monitor serum bicarb/electrolytes/Cr daily, BMP am - replete electrolytes PRN   Primary HTN-well-controlled with baseline low diastolic readings -discontinued amlodipine to help prevent vascular leaking -Holding home ACE inhibitor due to kidney function   Depression -Continue home Lexapro   GERD -Continue home PPI  Chronic iron deficiency anemia -Continue home iron  DVT prophylaxis: Place and maintain sequential compression device Start: 07/04/21 1447 heparin injection 5,000 Units Start: 06/27/21 0600   Diet:  Diet Orders (From admission, onward)     Start  Ordered   07/05/21 0000  Diet - low sodium heart healthy        07/05/21 1257   06/30/21 1443  DIET SOFT Room service appropriate? Yes with Assist; Fluid consistency: Thin  Diet effective now       Comments: Please CUT the meats and add Gravy;  NO  STRAWS!!! CREAM SOUPS ONLY  Question Answer Comment  Room service appropriate? Yes with Assist   Fluid consistency: Thin      06/30/21 1443            Subjective 07/16/21    Pt doing very well today. Has no respiratory distress at baseline resting. Endorses feeling a little "wonky" when exerting himself.  Disposition Plan & Communication  Patient status: Inpatient  Admitted From: SNF Disposition: Skilled nursing facility Anticipated discharge date: TBD, when can tolerate O2 stabilization around 5L for the SNF requirements  Family Communication: daughter via telephone Consults, Procedures, Significant Events  Consultants:  Nephrology General surgery Palliative  Pulmonology    Procedures/significant events:  Small bowel resection 11/4  Antimicrobials:  Anti-infectives (From admission, onward)    Start     Dose/Rate Route Frequency Ordered Stop   07/14/21 1630  azithromycin (ZITHROMAX) tablet 500 mg        500 mg Oral Daily 07/14/21 1536     06/30/21 1300  cefTRIAXone (ROCEPHIN) 2 g in sodium chloride 0.9 % 100 mL IVPB        2 g 200 mL/hr over 30 Minutes Intravenous Every 24 hours 06/30/21 1205 07/03/21 1615   06/27/21 1400  azithromycin (ZITHROMAX) 250 mg in dextrose 5 % 125 mL IVPB        250 mg 125 mL/hr over 60 Minutes Intravenous Every 24 hours 06/27/21 1239 07/01/21 1759   06/26/21 1800  piperacillin-tazobactam (ZOSYN) IVPB 2.25 g  Status:  Discontinued        2.25 g 100 mL/hr over 30 Minutes Intravenous Every 6 hours 06/26/21 1604 06/28/21 0748   06/22/21 2100  ceFEPIme (MAXIPIME) 2 g in sodium chloride 0.9 % 100 mL IVPB  Status:  Discontinued        2 g 200 mL/hr over 30 Minutes Intravenous Every 24 hours 06/21/21 2331 06/26/21 1604   06/22/21 0600  metroNIDAZOLE (FLAGYL) IVPB 500 mg  Status:  Discontinued        500 mg 100 mL/hr over 60 Minutes Intravenous Every 8 hours 06/21/21 2331 06/26/21 1604   06/21/21 2100  ceFEPIme (MAXIPIME) 2 g in sodium  chloride 0.9 % 100 mL IVPB        2 g 200 mL/hr over 30 Minutes Intravenous  Once 06/21/21 2050 06/21/21 2130   06/21/21 2100  metroNIDAZOLE (FLAGYL) IVPB 500 mg        500 mg 100 mL/hr over 60 Minutes Intravenous  Once 06/21/21 2050 06/22/21 1616       Objective   Vitals:   07/15/21 2056 07/15/21 2351 07/16/21 0500 07/16/21 0815  BP:  (!) 121/59 122/64   Pulse:  77 80   Resp:  16    Temp:  (!) 97.2 F (36.2 C) 98.2 F (36.8 C)   TempSrc:      SpO2: 97% 94%  97%  Weight:      Height:        Intake/Output Summary (Last 24 hours) at 07/16/2021 1027 Last data filed at 07/16/2021 1021 Gross per 24 hour  Intake 360 ml  Output 450 ml  Net -90 ml  Filed Weights   06/22/21 2134 06/26/21 1549 06/27/21 0458  Weight: 96.2 kg 103.6 kg 104.4 kg    Patient BMI: Body mass index is 34.49 kg/m.   Physical Exam: General: awake, alert, NAD HEENT: atraumatic, clear conjunctiva, anicteric sclera, moist mucus membranes, hard of hearing Respiratory: normal respiratory effort. Cardiovascular:quick capillary refill  Gastrointestinal: soft, NT, ND, no HSM felt Nervous: Alert. no gross focal neurologic deficits Extremities: moves all equally, no edema, normal tone Skin: dry, intact, normal temperature, normal color, No rashes, lesions or ulcers Psychiatry: normal mood, congruent affect  Labs   CBC    Component Value Date/Time   WBC 8.5 07/15/2021 0538   RBC 3.01 (L) 07/15/2021 0538   HGB 9.4 (L) 07/15/2021 0538   HGB 12.8 (L) 03/19/2014 1049   HCT 28.2 (L) 07/15/2021 0538   HCT 37.7 (L) 03/19/2014 1049   PLT 179 07/15/2021 0538   PLT 137 (L) 03/19/2014 1049   MCV 93.7 07/15/2021 0538   MCV 97 03/19/2014 1049   MCH 31.2 07/15/2021 0538   MCHC 33.3 07/15/2021 0538   RDW 15.1 07/15/2021 0538   RDW 13.2 03/19/2014 1049   LYMPHSABS 0.5 (L) 06/27/2021 0334   MONOABS 1.6 (H) 06/27/2021 0334   EOSABS 0.1 06/27/2021 0334   BASOSABS 0.1 06/27/2021 0334   BMP Latest Ref Rng & Units  07/16/2021 07/15/2021 07/14/2021  Glucose 70 - 99 mg/dL 256(H) 205(H) 133(H)  BUN 8 - 23 mg/dL 102(H) 94(H) 85(H)  Creatinine 0.61 - 1.24 mg/dL 2.47(H) 2.24(H) 1.88(H)  Sodium 135 - 145 mmol/L 136 138 140  Potassium 3.5 - 5.1 mmol/L 4.2 3.8 3.4(L)  Chloride 98 - 111 mmol/L 92(L) 95(L) 95(L)  CO2 22 - 32 mmol/L 32 33(H) 36(H)  Calcium 8.9 - 10.3 mg/dL 8.3(L) 8.2(L) 8.5(L)    Imaging Studies  No results found. Medications   Scheduled Meds:  acidophilus  2 capsule Oral TID   albuterol  2.5 mg Nebulization TID   azithromycin  500 mg Oral Daily   cholecalciferol  1,000 Units Oral Daily   escitalopram  10 mg Oral Daily   famotidine  10 mg Oral BID   feeding supplement  237 mL Oral TID BM   ferrous sulfate  325 mg Oral Q breakfast   fluticasone  2 spray Each Nare Daily   guaiFENesin  1,200 mg Oral BID   heparin injection (subcutaneous)  5,000 Units Subcutaneous Q8H   mouth rinse  15 mL Mouth Rinse BID   metolazone  5 mg Oral Once per day on Mon Wed Fri   multivitamin with minerals  1 tablet Oral Daily   predniSONE  40 mg Oral Q breakfast   SM Double Antibiotic  1 application Topical BID   spironolactone  25 mg Oral Daily   torsemide  100 mg Oral Daily   traZODone  150 mg Oral QHS   umeclidinium-vilanterol  2 puff Inhalation Daily   No recently discontinued medications to reconcile  LOS: 25 days   Time spent: >5min  Oasis Goehring L Taia Bramlett, DO [facility] 07/16/2021, 10:27 AM   Please refer to amion to contact the [facility] Attending or Consulting provider for this pt  [email or web address] Available by Epic secure chat 7AM-7PM. If 7PM-7AM, please contact night-coverage

## 2021-07-17 LAB — BASIC METABOLIC PANEL
Anion gap: 13 (ref 5–15)
BUN: 109 mg/dL — ABNORMAL HIGH (ref 8–23)
CO2: 28 mmol/L (ref 22–32)
Calcium: 8.7 mg/dL — ABNORMAL LOW (ref 8.9–10.3)
Chloride: 93 mmol/L — ABNORMAL LOW (ref 98–111)
Creatinine, Ser: 2.35 mg/dL — ABNORMAL HIGH (ref 0.61–1.24)
GFR, Estimated: 25 mL/min — ABNORMAL LOW (ref >60)
Glucose, Bld: 267 mg/dL — ABNORMAL HIGH (ref 70–99)
Potassium: 4.5 mmol/L (ref 3.5–5.1)
Sodium: 134 mmol/L — ABNORMAL LOW (ref 135–145)

## 2021-07-17 MED ORDER — LOPERAMIDE HCL 2 MG PO CAPS: 2.0000 mg | ORAL_CAPSULE | ORAL | Status: DC | PRN

## 2021-07-17 MED ORDER — PREDNISONE 20 MG PO TABS
20.0000 mg | ORAL_TABLET | Freq: Every day | ORAL | Status: DC
  Administered 2021-07-18: 20 mg via ORAL
  Filled 2021-07-17: qty 1

## 2021-07-17 NOTE — Progress Notes (Signed)
Physical Therapy Treatment Patient Details Name: Trevor Ferguson MRN: [id number] DOB: [birth year] Today's Date: 07/17/2021   History of Present Illness Patient is a 87 year old male with PMH of COPD, prostate cancer, HTN, sleep apnea, stage IIIB CKD and CVA. He presented to ER with acute onset of R lower quadrant abdominal pain with nausea and dry heaves.  Patient underwent exploratory laparotomy with small bowl resection on 06/22/21. Patient fell on 06/23/21 while in hospital. Pt also found to have acute hypoxic respiratory failure 2/2 atelectasis versus early pneumonia. Patient came from [facility] SNF    PT Comments    Patient was alert and oriented and lying in bed when physical therapist arrived.  Patient was agreeable for completion of physical therapy exercises.  Patient pleated several exercises including ambulating around room utilizing 2 L of oxygen.  Following ambulation of 50 feet patient's oxygen desaturated to 86% and within 1 minute of resting return to 89% within 2 minutes of resting return to 91%.  Physical therapist noted sore on patient's sacral area and although did not have any bleeding informed patient to continue to monitor this and perform regular changes in positioning to provide relief to this area.  Following ambulatory bout and physical therapy exercises as described in this note patient was seated in reclining chair with chair alarm set and with phone, call bell, and all other needs met.  Patient will continue benefit from physical therapy intervention in order to improve his strength, improve his functional capacity, and to prevent excessive flexion decline following prolonged hospital stay.  Recommendations for follow up therapy are one component of a multi-disciplinary discharge planning process, led by the attending physician.  Recommendations may be updated based on patient status, additional functional criteria and insurance authorization.  Follow Up Recommendations   Skilled nursing-short term rehab (<3 hours/day)     Assistance Recommended at Discharge Frequent or constant Supervision/Assistance  Equipment Recommendations  None recommended by PT    Recommendations for Other Services       Precautions / Restrictions Precautions Precautions: Fall Precaution Comments: check O2 Restrictions Weight Bearing Restrictions: No Other Position/Activity Restrictions: Frequent repositioning needed; buttocks is raw/bleeding in spots R/L buttock from frequent BMs (SN aware)     Mobility  Bed Mobility Overal bed mobility: Modified Independent (with supervision and use of bed rails) Bed Mobility: Supine to Sit     Supine to sit: Min guard Sit to supine: Min assist   General bed mobility comments: Pt in recliner at end of therpay session, in bed at start of session. Patient Response: Cooperative  Transfers Overall transfer level: Modified independent (increased time, CGA) Equipment used: Rolling Debose (2 wheels) Transfers: Sit to/from Stand Sit to Stand: Modified independent (Device/Increase time);Supervision Stand pivot transfers: Min guard   Step pivot transfers: Min guard     General transfer comment: set up of [place]    Ambulation/Gait Ambulation/Gait assistance: Independent Gait Distance (Feet): 50 Feet Assistive device: Rolling Schwanz (2 wheels) Gait Pattern/deviations: Step-through pattern;Decreased step length - right;Decreased step length - left;Narrow base of support;Shuffle Gait velocity: decreased     General Gait Details: Pt )2 sat stayed at 92 when transitioning to standing. Following 50 feet of walking 02 sat dropped to 86 and seated rest was provided.   Stairs             Wheelchair Mobility    Modified Rankin (Stroke Patients Only)       Balance Overall balance assessment: Needs assistance Sitting-balance  support: Feet supported Sitting balance-Leahy Scale: Normal Sitting balance - Comments: Good sitting  balance; able to tilt laterally to the L and reach with the R to perform toilet hygiene, supported by feet   Standing balance support: Bilateral upper extremity supported;During functional activity;Reliant on assistive device for balance Standing balance-Leahy Scale: Fair Standing balance comment: 1 LOB during amb, 1 LOB with dynamic standing while brushing teeth at sink; heavy reliance on 1 hand on countertop for stability                            Cognition Arousal/Alertness: Awake/alert Behavior During Therapy: WFL for tasks assessed/performed Overall Cognitive Status: Within Functional Limits for tasks assessed                                 General Comments: Pt alert and oriented to self, place, and situation.  Pt verbalizing fatigue.        Exercises General Exercises - Lower Extremity Long Arc Quad: 20 reps;Both;Strengthening;AROM Hip Flexion/Marching: 20 reps;AROM;Strengthening;Seated Other Exercises Other Exercises: Ambulation distance 50 feet with CGA and RW. 02 saty dropped to 86 following ambulatory bout and returned to 89 within 1 min and 91 within 2 min of seated rest    General Comments General comments (skin integrity, edema, etc.): O2 2L, 98% at beginning of session at rest; desat to 87% following grooming in standing.  3 min rest while seated to toilet with return to 90%.  Assisted pt back to recliner and increased to 98%.      Pertinent Vitals/Pain Pain Assessment: No/denies pain Pain Score: 0-No pain Faces Pain Scale: Hurts little more Breathing: occasional labored breathing, short period of hyperventilation Negative Vocalization: none Facial Expression: facial grimacing Body Language: relaxed Consolability: no need to console PAINAD Score: 3 Pain Location: buttocks when in certain seated positions Pain Descriptors / Indicators: Tender Pain Intervention(s): Monitored during session;Repositioned    Home Living                           Prior Function            PT Goals (current goals can now be found in the care plan section)      Frequency    Min 2X/week      PT Plan Current plan remains appropriate    Co-evaluation              AM-PAC PT "6 Clicks" Mobility   Outcome Measure  Help needed turning from your back to your side while in a flat bed without using bedrails?: A Little Help needed moving from lying on your back to sitting on the side of a flat bed without using bedrails?: A Little Help needed moving to and from a bed to a chair (including a wheelchair)?: A Little Help needed standing up from a chair using your arms (e.g., wheelchair or bedside chair)?: None Help needed to walk in hospital room?: A Little Help needed climbing 3-5 steps with a railing? : A Lot 6 Click Score: 18    End of Session Equipment Utilized During Treatment: Gait belt;Oxygen Activity Tolerance: Patient limited by fatigue Patient left: in chair;with chair alarm set;with call bell/phone within reach Nurse Communication: Mobility status PT Visit Diagnosis: Unsteadiness on feet (R26.81);Other abnormalities of gait and mobility (R26.89);Repeated falls (R29.6);Muscle weakness (generalized) (M62.81);Difficulty in walking,  not elsewhere classified (R26.2);History of falling (Z91.81)     Time: 0601-5615 PT Time Calculation (min) (ACUTE ONLY): 27 min  Charges:  $Therapeutic Exercise: 23-37 mins                     Rivka Barbara PT, DPT     Particia Lather 07/17/2021, 2:59 PM

## 2021-07-17 NOTE — Progress Notes (Signed)
SATURATION QUALIFICATIONS: (This note is used to comply with regulatory documentation for home oxygen)  Patient Saturations on 2L at Rest = 96%  Patient Saturations on 2L while Ambulating = 82%  Patient Saturations on 6 Liters of oxygen while Ambulating = 96%  Only ambulated a few feet and HR went to 155. When sat back in the chair able to wean 02 back down to 2L and HR slowly recovered.

## 2021-07-17 NOTE — Progress Notes (Signed)
Pulmonary Medicine          Date: 07/17/2021,   MRN# [id number] Trevor Ferguson Jan 03, 1926     AdmissionWeight: 94.3 kg                 CurrentWeight: 104.4 kg  Referring physician: Dr Ouida Sills    CHIEF COMPLAINT:   Increased O2 requirement    HISTORY OF PRESENT ILLNESS   This is a pleasant 87 yo M with hx of Adanced copd on O2, Smoked 1.5ppd, quit 1944-1987, HTN, OSA, CKD3, CVA, came in due to abd with NVD. On admission he had acute on chronic renal impairment with leukocytosis.  He underwent abd CT on admission with findings of recurrent acute diverticulitis with microperforation, right nephrolithiasis.  He has been treated with antimicrobials and has surgical eval s/p ex lap and resection and has been having increased O2 requirement. PCCM consultation to optimize respiratory status for DC planning. CT chest from 11/8 and recent serial CXRs reviewed with findings of interstitial edema and mild pleural effusion worse on right.   07/15/21- patient improved. Ow weaned to 3.5L.  PT/OT in progress. Continue metaneb as previous. Mild AKI not concerning continue diuresis. Will dc tussinex , mucomyst, solumedrol, pulmicort.  Keeping oxycontin for analgesia, steroids with prednisone PO.    07/16/21- patient is now on home O2 setting 2.5L/min.  He is improved from pulmonary perspective.  I have reduced steroids to 30mg  pred with plan to further wean.  07/17/21-  patient is improved to home o2 setting.   PAST MEDICAL HISTORY   Past Medical History:  Diagnosis Date   COPD (chronic obstructive pulmonary disease) ([place])    Hypertension    Pneumonia    2012,1985 hospitalized   Prostate cancer ([place])    with seed placement   Sleep apnea    noncompliant, dx 1996   Stroke ([place]) 2012   no residuals     SURGICAL HISTORY   Past Surgical History:  Procedure Laterality Date   adenoid     APPENDECTOMY     BOWEL RESECTION N/A 06/22/2021   Procedure: SMALL BOWEL RESECTION;   Surgeon: Herbert Pun, MD;  Location: [facility] ORS;  Service: General;  Laterality: N/A;   COLONOSCOPY WITH PROPOFOL N/A 01/28/2021   Procedure: COLONOSCOPY WITH PROPOFOL;  Surgeon: Lucilla Lame, MD;  Location: [facility] ENDOSCOPY;  Service: Endoscopy;  Laterality: N/A;  GI bleed   ENTEROSCOPY N/A 01/30/2021   Procedure: ENTEROSCOPY;  Surgeon: Lin Landsman, MD;  Location: [facility] ENDOSCOPY;  Service: Gastroenterology;  Laterality: N/A;   ESOPHAGOGASTRODUODENOSCOPY (EGD) WITH PROPOFOL N/A 01/27/2021   Procedure: ESOPHAGOGASTRODUODENOSCOPY (EGD) WITH PROPOFOL;  Surgeon: Lucilla Lame, MD;  Location: [facility] ENDOSCOPY;  Service: Endoscopy;  Laterality: N/A;   GIVENS CAPSULE STUDY N/A 01/28/2021   Procedure: GIVENS CAPSULE STUDY;  Surgeon: Lin Landsman, MD;  Location: [facility] ENDOSCOPY;  Service: Gastroenterology;  Laterality: N/A;   HEMORRHOID SURGERY     LAPAROTOMY N/A 06/22/2021   Procedure: EXPLORATORY LAPAROTOMY;  Surgeon: Herbert Pun, MD;  Location: [facility] ORS;  Service: General;  Laterality: N/A;   spine cyst     TONSILLECTOMY       FAMILY HISTORY   Family History  Problem Relation Age of Onset   Congestive Heart Failure Mother    COPD Sister    Asthma Sister    Tuberculosis Maternal Grandmother    Stroke Maternal Grandfather      SOCIAL HISTORY   Social History   Tobacco Use  Smoking status: Former    Years: 40.00    Types: Cigarettes    Quit date: 08/19/1985    Years since quitting: 35.9   Smokeless tobacco: Never  Vaping Use   Vaping Use: Never used  Substance Use Topics   Alcohol use: Yes    Alcohol/week: 14.0 standard drinks    Types: 14 Shots of liquor per week   Drug use: No     MEDICATIONS    Home Medication:  Current Outpatient Rx   Order #: [id number] Class: No Print   Order #: [id number] Class: No Print   Order #: [id number] Class: Print    Current Medication:  Current Facility-Administered Medications:    0.9 %  sodium chloride infusion, ,  Intravenous, PRN, Oswald Hillock, MD, Last Rate: 10 mL/hr at 06/26/21 1905, New Bag at 06/26/21 1905   acetaminophen (TYLENOL) tablet 650 mg, 650 mg, Oral, Q6H PRN, 650 mg at 07/06/21 1152 **OR** acetaminophen (TYLENOL) suppository 650 mg, 650 mg, Rectal, Q6H PRN, Darrick Meigs, Marge Duncans, MD   acidophilus (RISAQUAD) capsule 2 capsule, 2 capsule, Oral, TID, Darrick Meigs, Marge Duncans, MD, 2 capsule at 07/17/21 0916   albuterol (PROVENTIL) (2.5 MG/3ML) 0.083% nebulizer solution 2.5 mg, 2.5 mg, Nebulization, TID, Lanney Gins, Parley Pidcock, MD, 2.5 mg at 07/17/21 1313   cholecalciferol (VITAMIN D3) tablet 1,000 Units, 1,000 Units, Oral, Daily, Oswald Hillock, MD, 1,000 Units at 07/17/21 0916   escitalopram (LEXAPRO) tablet 10 mg, 10 mg, Oral, Daily, Darrick Meigs, Gagan S, MD, 10 mg at 07/17/21 0916   famotidine (PEPCID) tablet 10 mg, 10 mg, Oral, BID, Darrick Meigs, Gagan S, MD, 10 mg at 07/17/21 0916   feeding supplement (ENSURE ENLIVE / ENSURE PLUS) liquid 237 mL, 237 mL, Oral, TID BM, Darrick Meigs, Gagan S, MD, 237 mL at 07/16/21 1418   ferrous sulfate tablet 325 mg, 325 mg, Oral, Q breakfast, Darrick Meigs, Gagan S, MD, 325 mg at 07/17/21 0916   fluticasone (FLONASE) 50 MCG/ACT nasal spray 2 spray, 2 spray, Each Nare, Daily, Mansy, Jan A, MD, 2 spray at 07/16/21 0923   guaiFENesin (MUCINEX) 12 hr tablet 1,200 mg, 1,200 mg, Oral, BID, Wyvonnia Dusky, MD, 1,200 mg at 07/17/21 0917   heparin injection 5,000 Units, 5,000 Units, Subcutaneous, Q8H, Darrick Meigs, Marge Duncans, MD, 5,000 Units at 07/17/21 1327   loperamide (IMODIUM) capsule 2 mg, 2 mg, Oral, PRN, Richarda Osmond, MD   MEDLINE mouth rinse, 15 mL, Mouth Rinse, BID, Darrick Meigs, Marge Duncans, MD, 15 mL at 07/17/21 1607   menthol-cetylpyridinium (CEPACOL) lozenge 3 mg, 1 lozenge, Oral, PRN, Darrick Meigs, Marge Duncans, MD   metolazone (ZAROXOLYN) tablet 5 mg, 5 mg, Oral, Once per day on Mon Wed Fri, Breeze, [place], NP, 5 mg at 07/16/21 1416   multivitamin with minerals tablet 1 tablet, 1 tablet, Oral, Daily, Oswald Hillock, MD, 1 tablet at  07/17/21 0916   oxyCODONE-acetaminophen (PERCOCET/ROXICET) 5-325 MG per tablet 1 tablet, 1 tablet, Oral, Q6H PRN, Oswald Hillock, MD, 1 tablet at 06/30/21 0956   predniSONE (DELTASONE) tablet 30 mg, 30 mg, Oral, Q breakfast, Lanney Gins, Demetrious Rainford, MD, 30 mg at 07/17/21 0916   simethicone (MYLICON) chewable tablet 80 mg, 80 mg, Oral, QID PRN, Oswald Hillock, MD, 80 mg at 06/24/21 1508   SM Double Antibiotic 500-10000 UNIT/GM OINT 1 application, 1 application, Topical, BID, Mansy, Jan A, MD, 1 application at 37/10/62 1327   sodium chloride (OCEAN) 0.65 % nasal spray 1 spray, 1 spray, Each Nare, PRN, Oswald Hillock, MD, 1 spray at 07/17/21  0918   traZODone (DESYREL) tablet 150 mg, 150 mg, Oral, QHS, Darrick Meigs, Gagan S, MD, 150 mg at 07/16/21 2151   umeclidinium-vilanterol (ANORO ELLIPTA) 62.5-25 MCG/ACT 2 puff, 2 puff, Inhalation, Daily, Richarda Osmond, MD, 2 puff at 07/17/21 1328    ALLERGIES   Penicillins, Bee venom, and Darvon [propoxyphene]     REVIEW OF SYSTEMS    Review of Systems:  Gen:  Denies  fever, sweats, chills weigh loss  HEENT: Denies blurred vision, double vision, ear pain, eye pain, hearing loss, nose bleeds, sore throat Cardiac:  No dizziness, chest pain or heaviness, chest tightness,edema Resp:   Denies cough or sputum porduction, shortness of breath,wheezing, hemoptysis,  Gi: Denies swallowing difficulty, stomach pain, nausea or vomiting, diarrhea, constipation, bowel incontinence Gu:  Denies bladder incontinence, burning urine Ext:   Denies Joint pain, stiffness or swelling Skin: Denies  skin rash, easy bruising or bleeding or hives Endoc:  Denies polyuria, polydipsia , polyphagia or weight change Psych:   Denies depression, insomnia or hallucinations   Other:  All other systems negative   VS: BP (!) 152/84 (BP Location: Left Arm)   Pulse 88   Temp 98.3 F (36.8 C)   Resp 20   Ht 5' 8.5" (1.74 m)   Wt 104.4 kg   SpO2 94%   BMI 34.49 kg/m      PHYSICAL EXAM     GENERAL:NAD, no fevers, chills, no weakness no fatigue HEAD: Normocephalic, atraumatic.  EYES: Pupils equal, round, reactive to light. Extraocular muscles intact. No scleral icterus.  MOUTH: Moist mucosal membrane. Dentition intact. No abscess noted.  EAR, NOSE, THROAT: Clear without exudates. No external lesions.  NECK: Supple. No thyromegaly. No nodules. No JVD.  PULMONARY: Right lower lobe crackles with decreased air entry bilaterally  CARDIOVASCULAR: S1 and S2. Regular rate and rhythm. No murmurs, rubs, or gallops. No edema. Pedal pulses 2+ bilaterally.  GASTROINTESTINAL: Soft, nontender, nondistended. No masses. Positive bowel sounds. No hepatosplenomegaly.  MUSCULOSKELETAL: No swelling, clubbing, or edema. Range of motion full in all extremities.  NEUROLOGIC: Cranial nerves II through XII are intact. No gross focal neurological deficits. Sensation intact. Reflexes intact.  SKIN: No ulceration, lesions, rashes, or cyanosis. Skin warm and dry. Turgor intact.  PSYCHIATRIC: Mood, affect within normal limits. The patient is awake, alert and oriented x 3. Insight, judgment intact.       IMAGING    CT ABDOMEN PELVIS WO CONTRAST  Result Date: 06/26/2021 CLINICAL DATA:  Status post small bowel resection for small bowel diverticulitis. Worsening clinical condition. EXAM: CT ABDOMEN AND PELVIS WITHOUT CONTRAST TECHNIQUE: Multidetector CT imaging of the abdomen and pelvis was performed following the standard protocol without IV contrast. COMPARISON:  06/21/2021 FINDINGS: Lower chest: Minimal dependent atelectasis or infiltrate in both lungs with tiny right effusion. Hepatobiliary: No focal abnormality in the liver on this study without intravenous contrast. Gallbladder is markedly distended. No intrahepatic or extrahepatic biliary dilation. Pancreas: No focal mass lesion. No dilatation of the main duct. No intraparenchymal cyst. No peripancreatic edema. Spleen: No splenomegaly. No focal mass  lesion. Adrenals/Urinary Tract: No adrenal nodule or mass. Right kidney unremarkable. 2 cm low-density lesion interpolar left kidney is stable in the interval, likely a cyst. No evidence for hydroureter. The urinary bladder appears normal for the degree of distention. Stomach/Bowel: Stomach is unremarkable. No gastric wall thickening. No evidence of outlet obstruction. Duodenum is normally positioned as is the ligament of Treitz. Duodenal diverticulum noted. No small bowel wall thickening. No small  bowel dilatation. Left abdominal anastomosis is unremarkable without evidence of wall thickening, pneumatosis, or adjacent fluid/extraluminal gas. No small bowel dilatation. The terminal ileum is normal. Right colon is mildly distended with gas and fluid. Left colon is nondilated. Diverticular change noted in the left colon without diverticulitis. Vascular/Lymphatic: There is moderate atherosclerotic calcification of the abdominal aorta without aneurysm. There is no gastrohepatic or hepatoduodenal ligament lymphadenopathy. No retroperitoneal or mesenteric lymphadenopathy. No pelvic sidewall lymphadenopathy. Reproductive: Brachytherapy seeds noted in the prostate gland. Other: No intraperitoneal free fluid.  No intraperitoneal free air. Musculoskeletal: Diffuse body wall edema. No worrisome lytic or sclerotic osseous abnormality. IMPRESSION: 1. Left abdominal small bowel anastomosis is unremarkable. No evidence for wall thickening, pneumatosis, or adjacent fluid/extraluminal gas. No free fluid in the peritoneal cavity. No small bowel dilatation. 2. Marked gallbladder distension. 3. Right colon is mildly distended with gas and fluid. Imaging features are nonspecific. 4. Left colonic diverticulosis without diverticulitis. 5. Diffuse body wall edema. 6. Aortic Atherosclerosis (ICD10-I70.0). Electronically Signed   By: Misty Stanley M.D.   On: 06/26/2021 18:28   CT ABDOMEN PELVIS WO CONTRAST  Result Date:  06/21/2021 CLINICAL DATA:  87 year old with abdominal distension and right lower quadrant pain EXAM: CT ABDOMEN AND PELVIS WITHOUT CONTRAST TECHNIQUE: Multidetector CT imaging of the abdomen and pelvis was performed following the standard protocol without IV contrast. COMPARISON:  CT 03/10/2020 FINDINGS: Lower chest: Subpleural reticular opacity and ground-glass in the right greater than left lower lobe. Resolution of previous pleural effusions. Hepatobiliary: No focal hepatic abnormality on this unenhanced exam. Unremarkable gallbladder. No calcified gallstone. No biliary dilatation. Pancreas: Age related fatty atrophy No ductal dilatation or inflammation. Spleen: Normal in size without focal abnormality. Adrenals/Urinary Tract: Normal adrenal glands. No hydronephrosis. Small upper pole right renal calculus. Small left renal cyst. Unremarkable urinary bladder. Stomach/Bowel: Decompressed stomach. Fluid-filled duodenal diverticulum without inflammation. There is no inflamed small bowel diverticula in the right abdomen, series 5, image 32 hand series 2, image 56. Adjacent fluid-filled small bowel and mesenteric edema. Small amount of mottled gas in the adjacent mesentery, series 2, image 54. This does not appear to be within the mesenteric vasculature. There is no small bowel pneumatosis. Regional small bowel are mildly prominent fluid-filled suggesting regional ileus. Fluid within the cecum and ascending colon. There is formed stool in the more distal colon. Descending and sigmoid colonic diverticulosis without colonic diverticulitis. Vascular/Lymphatic: Aortic atherosclerosis. No aortic aneurysm. There is no portal venous gas. No abdominopelvic adenopathy. Reproductive: Brachytherapy seeds in the prostate. Other: Inflammatory changes with stranding and mesenteric edema in the right small bowel mesentery. Small amount of mottled extraluminal gas related small bowel diverticulitis. No abdominopelvic ascites. No focal  or drainable collection. Fat containing bilateral inguinal hernias. Musculoskeletal: Thoracolumbar spine degenerative change. There are no acute or suspicious osseous abnormalities. IMPRESSION: 1. Recurrent acute small bowel diverticulitis in the right abdomen. Small amount of mottled extraluminal gas in the adjacent mesentery consistent with microperforation. No focal or drainable collection. 2. Regional small bowel ileus. 3. Colonic diverticulosis without diverticulitis. 4. Nonobstructing right nephrolithiasis. 5. Subpleural reticular opacity and ground-glass in the right greater than left lower lobe, favoring post infectious/inflammatory sequela. Aortic Atherosclerosis (ICD10-I70.0). Critical Value/emergent results were called by telephone at the time of interpretation on 06/21/2021 at 8:28 pm to provider [facility] , who verbally acknowledged these results. Electronically Signed   By: Keith Rake M.D.   On: 06/21/2021 20:29   DG Chest 2 View  Result Date: 07/12/2021 CLINICAL DATA:  Chest pain, cough, and shortness of breath. EXAM: CHEST - 2 VIEW COMPARISON:  Chest x-ray dated July 09, 2021. FINDINGS: Stable cardiomediastinal silhouette. Chronically coarsened interstitial markings. Unchanged mild bibasilar atelectasis and small bilateral pleural effusions. No pneumothorax. No acute osseous abnormality. IMPRESSION: 1. Unchanged mild bibasilar atelectasis and small bilateral pleural effusions. Electronically Signed   By: Titus Dubin M.D.   On: 07/12/2021 11:20   DG Chest 2 View  Result Date: 07/09/2021 CLINICAL DATA:  Shortness of breath. History of COPD, hypertension pneumonia. Former smoker EXAM: CHEST - 2 VIEW COMPARISON:  None. FINDINGS: The heart is enlarged. Hyperinflated lungs with reticular opacities in the right middle lung as well as bilateral lung bases are unchanged. Bilateral pleural effusions, left greater than right with bibasilar atelectasis is also unchanged. Osteopenia with  mild thoracic kyphosis. IMPRESSION: 1.  Stable cardiomegaly. 2. COPD. Reticular opacity in right middle lung as well as in bilateral lung bases with small bilateral pleural effusions concerning for pulmonary edema/infiltrate or chronic interstitial lung disease, not significantly changed. Electronically Signed   By: Keane Police D.O.   On: 07/09/2021 11:02   CT HEAD WO CONTRAST (5MM)  Result Date: 06/26/2021 CLINICAL DATA:  Mental status change, unknown cause EXAM: CT HEAD WITHOUT CONTRAST TECHNIQUE: Contiguous axial images were obtained from the base of the skull through the vertex without intravenous contrast. COMPARISON:  06/23/2021 FINDINGS: Brain: No evidence of acute infarction, hemorrhage, cerebral edema, mass, mass effect, or midline shift. Ventricles and sulci are within normal limits for age. No extra-axial fluid collection. Periventricular white matter changes, likely the sequela of chronic small vessel ischemic disease. Remote lacunar infarct in the left basal ganglia Vascular: No hyperdense vessel. Atherosclerotic calcifications in the intracranial carotid and vertebral arteries. Skull: Normal. Negative for fracture or focal lesion. Sinuses/Orbits: Mucosal thickening in the ethmoid air cells. Status post bilateral lens replacements. Other: The mastoid air cells are well aerated. IMPRESSION: No acute intracranial process. Electronically Signed   By: Merilyn Baba M.D.   On: 06/26/2021 18:15   CT HEAD WO CONTRAST (5MM)  Result Date: 06/23/2021 CLINICAL DATA:  Unwitnessed fall. EXAM: CT HEAD WITHOUT CONTRAST TECHNIQUE: Contiguous axial images were obtained from the base of the skull through the vertex without intravenous contrast. COMPARISON:  Head CT 03/07/2019 FINDINGS: Brain: Age related atrophy and chronic small vessel ischemia. No intracranial hemorrhage, mass effect, or midline shift. No hydrocephalus. The basilar cisterns are patent. No evidence of territorial infarct or acute ischemia. No  extra-axial or intracranial fluid collection. Vascular: Atherosclerosis of skullbase vasculature without hyperdense vessel or abnormal calcification. Skull: No fracture or focal lesion. Sinuses/Orbits: Chronic opacification of right ethmoid air cells. Trace mucosal thickening in left side of sphenoid sinus. Bilateral cataract resection Other: No confluent scalp hematoma. IMPRESSION: 1. No acute intracranial abnormality. No skull fracture. 2. Age related atrophy and chronic small vessel ischemia. Electronically Signed   By: Keith Rake M.D.   On: 06/23/2021 22:35   US RENAL  Result Date: 06/25/2021 CLINICAL DATA:  Acute kidney insufficiency EXAM: RENAL / URINARY TRACT ULTRASOUND COMPLETE COMPARISON:  CT abdomen 06/21/2021 FINDINGS: Right Kidney: Renal measurements: 10.7 x 5.1 x 5.5 cm = volume: 158 mL. Increased echogenicity with cortical thinning. 10 mm nonobstructing calculus in the upper pole. No hydronephrosis. Left Kidney: Renal measurements: 13.4 x 6 x 5.6 cm = volume: 236 mL. Increased echogenicity with cortical thinning in the upper and midpole. Anechoic cysts measuring up to 1.7 cm in the upper pole. No nephrolithiasis or  hydronephrosis identified. Bladder: Appears normal for degree of bladder distention. Other: None. IMPRESSION: 1. Evidence of medical renal disease. 2. Right renal calculus. 3. Left renal cysts. Electronically Signed   By: Ofilia Neas M.D.   On: 06/25/2021 11:05   DG Chest Port 1 View  Result Date: 07/14/2021 CLINICAL DATA:  Difficulty breathing EXAM: PORTABLE CHEST 1 VIEW COMPARISON:  Previous studies including the examination of 07/12/2021 FINDINGS: Transverse diameter of heart is increased. Central pulmonary vessels are more prominent. Increased interstitial markings are seen in parahilar regions with interval worsening. There are linear densities and increased interstitial markings in both lower lung fields with interval worsening. There is blunting of left lateral CP  angle. There is no pneumothorax. IMPRESSION: Increased interstitial markings are seen in the parahilar regions and lower lung fields with interval worsening suggesting pulmonary edema or bilateral pneumonia. Possible small left pleural effusion. Electronically Signed   By: Elmer Picker M.D.   On: 07/14/2021 15:30   DG Chest Port 1 View  Result Date: 07/05/2021 CLINICAL DATA:  Shortness of breath. EXAM: PORTABLE CHEST 1 VIEW COMPARISON:  Radiograph 06/30/2021, additional prior exams reviewed. FINDINGS: Unchanged cardiomegaly. Small pleural effusions, left greater than right, slightly increased. Patchy airspace disease in the right mid lung is not significantly changed. Diffuse interstitial opacity. No pneumothorax. Stable osseous structures. IMPRESSION: 1. Unchanged cardiomegaly. Diffuse pulmonary interstitial opacity may represent pulmonary edema. 2. Small pleural effusions, left greater than right, slightly increased. 3. Unchanged patchy airspace disease in the right mid lung, indeterminate for pneumonia or scarring. Electronically Signed   By: Keith Rake M.D.   On: 07/05/2021 18:25   DG Chest Port 1 View  Result Date: 06/30/2021 CLINICAL DATA:  Dyspnea R06.00 (ICD-10-CM) EXAM: PORTABLE CHEST 1 VIEW COMPARISON:  June 26, 2021. FINDINGS: Slightly increased conspicuity of patchy airspace opacity in the right midlung. No visible pleural effusions or pneumothorax. Similar enlarged cardiac silhouette. Degenerative changes of the spine. IMPRESSION: 1. Slightly increased conspicuity of patchy airspace opacity in the right midlung, suspicious for pneumonia. Followup PA and lateral chest X-ray is recommended in 3-4 weeks following trial of antibiotic therapy to ensure resolution and exclude underlying malignancy. 2. Similar cardiomegaly. Electronically Signed   By: Margaretha Sheffield M.D.   On: 06/30/2021 11:43   DG Chest Port 1 View  Result Date: 06/26/2021 CLINICAL DATA:  Hypoxia. EXAM:  PORTABLE CHEST 1 VIEW COMPARISON:  Prior chest radiographs 03/06/2020 and earlier. FINDINGS: Cardiomegaly, unchanged. Subtle ill-defined opacity within the right mid-lung. No appreciable airspace consolidation within the left lung. No evidence of pleural effusion or pneumothorax. No acute bony abnormality identified. Degenerative changes of the spine. IMPRESSION: Subtle ill-defined opacity within the right mid-lung, which may reflect scarring, atelectasis or early pneumonia. Clinical correlation is recommended. Additionally, short-interval radiographic follow-up is recommended. Cardiomegaly, unchanged. Electronically Signed   By: Kellie Simmering D.O.   On: 06/26/2021 11:09   DG Abd 2 Views  Result Date: 06/24/2021 CLINICAL DATA:  Abdominal distension EXAM: ABDOMEN - 2 VIEW COMPARISON:  06/21/2021 FINDINGS: Scattered large and small bowel gas is noted. No significant dilatation is noted. Postsurgical changes are seen consistent with recent exploratory laparotomy. These findings are likely related to a postoperative ileus. No definitive free air is seen. No acute bony abnormality is noted. Prostate therapy seeds are seen. IMPRESSION: Changes consistent with mild postoperative ileus. Electronically Signed   By: Inez Catalina M.D.   On: 06/24/2021 21:26   ECHOCARDIOGRAM COMPLETE  Result Date: 07/16/2021    ECHOCARDIOGRAM  REPORT   Patient Name:   Trevor Ferguson Date of Exam: 07/16/2021 Medical Rec #:  [id number]       Height:       68.5 in Accession #:    [id number]      Weight:       230.2 lb Date of Birth:  04-Apr-1926       BSA:          2.181 m Patient Age:    60 years        BP:           122/70 mmHg Patient Gender: M               HR:           85 bpm. Exam Location:  [facility] Procedure: 2D Echo, Cardiac Doppler and Color Doppler Indications:     Dyspnea R06.00  History:         Patient has prior history of Echocardiogram examinations, most                  recent 03/02/2020. COPD and Stroke; Risk  Factors:Hypertension                  and Sleep Apnea. Pneumonia.  Sonographer:     Sherrie Sport Referring Phys:  [place] Diagnosing Phys: Kathlyn [place] MD  Sonographer Comments: Suboptimal apical window. IMPRESSIONS  1. Left ventricular ejection fraction, by estimation, is 60 to 65%. The left ventricle has normal function. The left ventricle has no regional wall motion abnormalities. There is mild left ventricular hypertrophy. Left ventricular diastolic parameters are indeterminate.  2. Right ventricular systolic function is normal. The right ventricular size is normal.  3. Left atrial size was mild to moderately dilated.  4. Right atrial size was mild to moderately dilated.  5. The mitral valve is normal in structure. No evidence of mitral valve regurgitation. No evidence of mitral stenosis.  6. The aortic valve is normal in structure. Aortic valve regurgitation is not visualized. Aortic valve sclerosis/calcification is present, without any evidence of aortic stenosis. FINDINGS  Left Ventricle: Left ventricular ejection fraction, by estimation, is 60 to 65%. The left ventricle has normal function. The left ventricle has no regional wall motion abnormalities. The left ventricular internal cavity size was normal in size. There is  mild left ventricular hypertrophy. Left ventricular diastolic parameters are indeterminate. Right Ventricle: The right ventricular size is normal. No increase in right ventricular wall thickness. Right ventricular systolic function is normal. Left Atrium: Left atrial size was mild to moderately dilated. Right Atrium: Right atrial size was mild to moderately dilated. Pericardium: There is no evidence of pericardial effusion. Mitral Valve: The mitral valve is normal in structure. No evidence of mitral valve regurgitation. No evidence of mitral valve stenosis. MV peak gradient, 6.4 mmHg. The mean mitral valve gradient is 3.0 mmHg. Tricuspid Valve: The tricuspid valve is normal in  structure. Tricuspid valve regurgitation is trivial. No evidence of tricuspid stenosis. Aortic Valve: The aortic valve is normal in structure. Aortic valve regurgitation is not visualized. Aortic valve sclerosis/calcification is present, without any evidence of aortic stenosis. Aortic valve mean gradient measures 6.0 mmHg. Aortic valve peak  gradient measures 11.3 mmHg. Aortic valve area, by VTI measures 1.33 cm. Pulmonic Valve: The pulmonic valve was normal in structure. Pulmonic valve regurgitation is not visualized. No evidence of pulmonic stenosis. Aorta: The aortic root is normal in size and structure. Venous: The inferior vena  cava was not well visualized. IAS/Shunts: No atrial level shunt detected by color flow Doppler.  LEFT VENTRICLE PLAX 2D LVIDd:         4.40 cm   Diastology LVIDs:         2.80 cm   LV e' medial:    7.62 cm/s LV PW:         1.30 cm   LV E/e' medial:  12.0 LV IVS:        1.05 cm   LV e' lateral:   7.07 cm/s LVOT diam:     2.00 cm   LV E/e' lateral: 12.9 LV SV:         41 LV SV Index:   19 LVOT Area:     3.14 cm  RIGHT VENTRICLE RV Basal diam:  4.30 cm RV S prime:     19.90 cm/s TAPSE (M-mode): 5.3 cm LEFT ATRIUM             Index        RIGHT ATRIUM           Index LA diam:        3.70 cm 1.70 cm/m   RA Area:     28.50 cm LA Vol (A2C):   98.9 ml 45.34 ml/m  RA Volume:   94.80 ml  43.46 ml/m LA Vol (A4C):   79.3 ml 36.35 ml/m LA Biplane Vol: 93.6 ml 42.91 ml/m  AORTIC VALVE                     PULMONIC VALVE AV Area (Vmax):    1.24 cm      PV Vmax:        1.02 m/s AV Area (Vmean):   1.26 cm      PV Vmean:       72.450 cm/s AV Area (VTI):     1.33 cm      PV VTI:         0.228 m AV Vmax:           168.33 cm/s   PV Peak grad:   4.2 mmHg AV Vmean:          107.567 cm/s  PV Mean grad:   2.0 mmHg AV VTI:            0.308 m       RVOT Peak grad: 6 mmHg AV Peak Grad:      11.3 mmHg AV Mean Grad:      6.0 mmHg LVOT Vmax:         66.60 cm/s LVOT Vmean:        43.100 cm/s LVOT VTI:           0.131 m LVOT/AV VTI ratio: 0.42  AORTA Ao Root diam: 3.17 cm MITRAL VALVE                TRICUSPID VALVE MV Area (PHT): 2.16 cm     TR Peak grad:   22.8 mmHg MV Area VTI:   0.86 cm     TR Vmax:        239.00 cm/s MV Peak grad:  6.4 mmHg MV Mean grad:  3.0 mmHg     SHUNTS MV Vmax:       1.26 m/s     Systemic VTI:  0.13 m MV Vmean:      75.4 cm/s    Systemic Diam: 2.00 cm MV Decel Time: 351 msec  Pulmonic VTI:  0.220 m MV E velocity: 91.30 cm/s MV A velocity: 107.00 cm/s MV E/A ratio:  0.85 Kathlyn Sacramento MD Electronically signed by Kathlyn Sacramento MD Signature Date/Time: 07/16/2021/4:10:42 PM    Final       ASSESSMENT/PLAN   Acute on chronic hypoxemic respiratory failure   Multifactorial in etiology - acutely he has post surgical central abdominal wound which is hindering respiratory capacity. Additional comorbid conditions affecting his oxygenation which are currently being optimized include advanced COPD with OSA overlap syndrome and CKD. Besides this he does have interstitial edema, pleural effusions and atelectasis on current CXR done today.     Bibasilar atelectasis    - c/w incentive spirometry   - encouraged patient to continue IS at bedside and flutter valve   - dcd mucomyst    - Have added recrutment TID with RT utilizing Metaneb with Aluterol neb   Pleural effusions and Interstitial edema  -Suspect from CKD and post surgical physical stress    -LE edema is 1+   -dcd aldactone - appreciate nephrology    Chronic COPD with signs of mild to moderate exacerbation     -steroids down to pred 30 , dc abx today      Diverticulitis s/p ex-Lap   -Large midline surgical scar with development of restrictive physiology    -patient is on PRN narcotics   - this is likely to reduce his vital capacity and we will perform spirometry with graph to review current lung function       Thank you for allowing me to participate in the care of this patient.  Total face to face encounter time  for this patient visit was > 10min. >50% of the time was  spent in counseling and coordination of care.   Patient/Family are satisfied with care plan and all questions have been answered.  This document was prepared using Dragon voice recognition software and may include unintentional dictation errors.     Ottie Glazier, M.D.  Division of [place]

## 2021-07-17 NOTE — Progress Notes (Incomplete)
Patient had 6 beats of vtach. Upon assessment patient was asleep and showing no signs of acute distress. Attendiu

## 2021-07-17 NOTE — Progress Notes (Addendum)
[facility]  PROGRESS NOTE   Subjective:   Patient seen resting comfortably Alert and oriented Edema improved Denies shortness of breath Creatinine 2.5 Urine output 2.35 L in preceding 24 hours  Objective:  Vital signs in last 24 hours:  Temp:  [97.5 F (36.4 C)-98.7 F (37.1 C)] 98.7 F (37.1 C) (11/29 1130) Pulse Rate:  [71-86] 71 (11/29 1130) Resp:  [15-21] 15 (11/29 1130) BP: (124-155)/(50-105) 142/58 (11/29 1130) SpO2:  [90 %-100 %] 97 % (11/29 1130)  Weight change:  Filed Weights   06/22/21 2134 06/26/21 1549 06/27/21 0458  Weight: 96.2 kg 103.6 kg 104.4 kg    Intake/Output: I/O last 3 completed shifts: In: 600 [P.O.:600] Out: 2800 [Urine:2800]   Intake/Output this shift:  Total I/O In: 240 [P.O.:240] Out: 1200 [Urine:1200]  Physical Exam: General:  No acute distress, resting in bed  Head:  Normocephalic, atraumatic. Moist oral mucosal membranes  Eyes:  Anicteric  Lungs:    wheeze at bases, O2 3L  Heart:  regular  Abdomen:   +midline incision with staples  Extremities:  1+ peripheral edema.  Neurologic:  Awake, alert, following commands  Skin:  No lesions       Basic Metabolic Panel: Recent Labs  Lab 07/13/21 0447 07/14/21 0539 07/15/21 0538 07/16/21 0558 07/17/21 0448  NA 142 140 138 136 134*  K 3.4* 3.4* 3.8 4.2 4.5  CL 95* 95* 95* 92* 93*  CO2 40* 36* 33* 32 28  GLUCOSE 185* 133* 205* 256* 267*  BUN 74* 85* 94* 102* 109*  CREATININE 1.98* 1.88* 2.24* 2.47* 2.35*  CALCIUM 8.5* 8.5* 8.2* 8.3* 8.7*     CBC: Recent Labs  Lab 07/11/21 0355 07/13/21 0447 07/15/21 0538  WBC 9.0 17.5* 8.5  HGB 9.0* 9.6* 9.4*  HCT 27.0* 28.8* 28.2*  MCV 92.5 93.5 93.7  PLT 200 213 179      Urinalysis: No results for input(s): COLORURINE, LABSPEC, PHURINE, GLUCOSEU, HGBUR, BILIRUBINUR, KETONESUR, PROTEINUR, UROBILINOGEN, NITRITE, LEUKOCYTESUR in the last 72 hours.  Invalid input(s): APPERANCEUR    Imaging: ECHOCARDIOGRAM  COMPLETE  Result Date: 07/16/2021    ECHOCARDIOGRAM REPORT   Patient Name:   Trevor Ferguson Date of Exam: 07/16/2021 Medical Rec #:  [id number]       Height:       68.5 in Accession #:    [id number]      Weight:       230.2 lb Date of Birth:  06/16/1926       BSA:          2.181 m Patient Age:    87 years        BP:           122/70 mmHg Patient Gender: M               HR:           85 bpm. Exam Location:  [facility] Procedure: 2D Echo, Cardiac Doppler and Color Doppler Indications:     Dyspnea R06.00  History:         Patient has prior history of Echocardiogram examinations, most                  recent 03/02/2020. COPD and Stroke; Risk Factors:Hypertension                  and Sleep Apnea. Pneumonia.  Sonographer:     Sherrie Sport Referring Phys:  [place] Diagnosing Phys: Kathlyn Sacramento MD  Sonographer Comments: Suboptimal apical window. IMPRESSIONS  1. Left ventricular ejection fraction, by estimation, is 60 to 65%. The left ventricle has normal function. The left ventricle has no regional wall motion abnormalities. There is mild left ventricular hypertrophy. Left ventricular diastolic parameters are indeterminate.  2. Right ventricular systolic function is normal. The right ventricular size is normal.  3. Left atrial size was mild to moderately dilated.  4. Right atrial size was mild to moderately dilated.  5. The mitral valve is normal in structure. No evidence of mitral valve regurgitation. No evidence of mitral stenosis.  6. The aortic valve is normal in structure. Aortic valve regurgitation is not visualized. Aortic valve sclerosis/calcification is present, without any evidence of aortic stenosis. FINDINGS  Left Ventricle: Left ventricular ejection fraction, by estimation, is 60 to 65%. The left ventricle has normal function. The left ventricle has no regional wall motion abnormalities. The left ventricular internal cavity size was normal in size. There is  mild left ventricular hypertrophy. Left  ventricular diastolic parameters are indeterminate. Right Ventricle: The right ventricular size is normal. No increase in right ventricular wall thickness. Right ventricular systolic function is normal. Left Atrium: Left atrial size was mild to moderately dilated. Right Atrium: Right atrial size was mild to moderately dilated. Pericardium: There is no evidence of pericardial effusion. Mitral Valve: The mitral valve is normal in structure. No evidence of mitral valve regurgitation. No evidence of mitral valve stenosis. MV peak gradient, 6.4 mmHg. The mean mitral valve gradient is 3.0 mmHg. Tricuspid Valve: The tricuspid valve is normal in structure. Tricuspid valve regurgitation is trivial. No evidence of tricuspid stenosis. Aortic Valve: The aortic valve is normal in structure. Aortic valve regurgitation is not visualized. Aortic valve sclerosis/calcification is present, without any evidence of aortic stenosis. Aortic valve mean gradient measures 6.0 mmHg. Aortic valve peak  gradient measures 11.3 mmHg. Aortic valve area, by VTI measures 1.33 cm. Pulmonic Valve: The pulmonic valve was normal in structure. Pulmonic valve regurgitation is not visualized. No evidence of pulmonic stenosis. Aorta: The aortic root is normal in size and structure. Venous: The inferior vena cava was not well visualized. IAS/Shunts: No atrial level shunt detected by color flow Doppler.  LEFT VENTRICLE PLAX 2D LVIDd:         4.40 cm   Diastology LVIDs:         2.80 cm   LV e' medial:    7.62 cm/s LV PW:         1.30 cm   LV E/e' medial:  12.0 LV IVS:        1.05 cm   LV e' lateral:   7.07 cm/s LVOT diam:     2.00 cm   LV E/e' lateral: 12.9 LV SV:         41 LV SV Index:   19 LVOT Area:     3.14 cm  RIGHT VENTRICLE RV Basal diam:  4.30 cm RV S prime:     19.90 cm/s TAPSE (M-mode): 5.3 cm LEFT ATRIUM             Index        RIGHT ATRIUM           Index LA diam:        3.70 cm 1.70 cm/m   RA Area:     28.50 cm LA Vol (A2C):   98.9 ml 45.34  ml/m  RA Volume:   94.80 ml  43.46 ml/m LA Vol (A4C):  79.3 ml 36.35 ml/m LA Biplane Vol: 93.6 ml 42.91 ml/m  AORTIC VALVE                     PULMONIC VALVE AV Area (Vmax):    1.24 cm      PV Vmax:        1.02 m/s AV Area (Vmean):   1.26 cm      PV Vmean:       72.450 cm/s AV Area (VTI):     1.33 cm      PV VTI:         0.228 m AV Vmax:           168.33 cm/s   PV Peak grad:   4.2 mmHg AV Vmean:          107.567 cm/s  PV Mean grad:   2.0 mmHg AV VTI:            0.308 m       RVOT Peak grad: 6 mmHg AV Peak Grad:      11.3 mmHg AV Mean Grad:      6.0 mmHg LVOT Vmax:         66.60 cm/s LVOT Vmean:        43.100 cm/s LVOT VTI:          0.131 m LVOT/AV VTI ratio: 0.42  AORTA Ao Root diam: 3.17 cm MITRAL VALVE                TRICUSPID VALVE MV Area (PHT): 2.16 cm     TR Peak grad:   22.8 mmHg MV Area VTI:   0.86 cm     TR Vmax:        239.00 cm/s MV Peak grad:  6.4 mmHg MV Mean grad:  3.0 mmHg     SHUNTS MV Vmax:       1.26 m/s     Systemic VTI:  0.13 m MV Vmean:      75.4 cm/s    Systemic Diam: 2.00 cm MV Decel Time: 351 msec     Pulmonic VTI:  0.220 m MV E velocity: 91.30 cm/s MV A velocity: 107.00 cm/s MV E/A ratio:  0.85 Kathlyn Sacramento MD Electronically signed by Kathlyn Sacramento MD Signature Date/Time: 07/16/2021/4:10:42 PM    Final      Medications:    sodium chloride 10 mL/hr at 06/26/21 1905    acidophilus  2 capsule Oral TID   albuterol  2.5 mg Nebulization TID   cholecalciferol  1,000 Units Oral Daily   escitalopram  10 mg Oral Daily   famotidine  10 mg Oral BID   feeding supplement  237 mL Oral TID BM   ferrous sulfate  325 mg Oral Q breakfast   fluticasone  2 spray Each Nare Daily   guaiFENesin  1,200 mg Oral BID   heparin injection (subcutaneous)  5,000 Units Subcutaneous Q8H   mouth rinse  15 mL Mouth Rinse BID   metolazone  5 mg Oral Once per day on Mon Wed Fri   multivitamin with minerals  1 tablet Oral Daily   predniSONE  30 mg Oral Q breakfast   SM Double Antibiotic  1  application Topical BID   traZODone  150 mg Oral QHS   umeclidinium-vilanterol  2 puff Inhalation Daily    Assessment/ Plan:     Active Problems:   COPD with acute exacerbation (HCC)   Acute diverticulitis   AKI (acute  kidney injury) ([place])   Diverticulitis   Hypoxia   Perforation of intestine due to diverticulitis of gastrointestinal tract   HAP (hospital-acquired pneumonia)   Abdominal distension (gaseous)   Pressure injury of skin   Dyspnea   Acute on chronic respiratory failure with hypoxia (HCC)   Acute diastolic congestive heart failure (HCC)   Pulmonary edema  Trevor Ferguson is a 87 y.o. white male with hypertension, coronary artery disease, hyperlipidemia, COPD, obstructive sleep apnea, who is admitted to [facility] on 06/21/2021 for Diverticulitis [K57.92] AKI (acute kidney injury) ([place]) [N17.9] Acute diverticulitis [K57.92] Perforation of intestine due to diverticulitis of gastrointestinal tract [K57.80]  Patient underwent Exlap with bowel resection on 06/22/2021.   Acute kidney injury on chronic kidney disease stage IIIB with proteinuria: baseline creatinine of 1.5, GFR of 43 on 07/05/21. Acute kidney injury secondary to prerenal azotemia and ATN. Nonoliguric urine output.  No indication for dialysis.   -Creatinine slightly increased today.  We will hold torsemide for today.  Metolazone 5 mg 3 times weekly. -Will schedule follow-up with nephrology 1 to 2 weeks after discharge  Anemia of chronic kidney disease: hemoglobin 9.4, normocytic. No indication for ESA. History of iron deficiency.   - Continue ferrous sulfate.   Hypertension with chronic kidney disease:  - Continue losartan and amlodipine -BP stable 142/58   4.  Lower extremity edema.    edema not improved, metolazone added 3 times weekly  5.  Metabolic alkalosis likely due to IV diuresis VBG shows pH of 7.52 with PCO2 at 46 Acetazolamide given CO2 within acceptable range   6.  Chronic diastolic heart  failure Echo obtained yesterday maintains EF of 60 to 65%   LOS: [facility] 11/29/202212:04 PM

## 2021-07-17 NOTE — Progress Notes (Signed)
Occupational Therapy Treatment Patient Details Name: Trevor Ferguson MRN: [id number] DOB: 11/30/1925 Today's Date: 07/17/2021   History of present illness Patient is a 87 year old male with PMH of COPD, prostate cancer, HTN, sleep apnea, stage IIIB CKD and CVA. He presented to ER with acute onset of R lower quadrant abdominal pain with nausea and dry heaves.  Patient underwent exploratory laparotomy with small bowl resection on 06/22/21. Patient fell on 06/23/21 while in hospital. Pt also found to have acute hypoxic respiratory failure 2/2 atelectasis versus early pneumonia. Patient came from [facility]   OT comments  Pt awake and up in chair upon OT arrival.  SN reported pt likely fatigued d/t "eventful" morning with multiple BM.  Pt verbalized fatigue but motivated to participate.  OT reinforced EC strategies, specifically minimal talking while ambulating and when performing standing ADLs, sit as needed for self care, and allow frequent rest breaks.  Pt demonstrated PLB without cues.  Pt completed standing grooming tasks at sink followed by seated toileting to have another BM at [facility].  Completed toilet hygiene with min A, mostly d/t pain with raw buttocks from multiple bowel movements.  O2 2L, 98% at beginning of session at rest; desat to 87% following grooming in standing.  3 min rest while seated to toilet with return to 90%.  Assisted pt back to recliner and increased to 98%.  OT reinforced frequent repositioning to reduce further skin breakdown on buttocks.  OT assisted pt to recline slightly in chair for pressure relief.  Left in chair with all needs met, chair alarm on, and call light within reach.  Reinforced need to use call light for all transfers/mobility d/t 2 LOB during OT session during mobility and standing ADL.  Pt verbalized understanding.    Recommendations for follow up therapy are one component of a multi-disciplinary discharge planning process, led by the attending physician.   Recommendations may be updated based on patient status, additional functional criteria and insurance authorization.    Follow Up Recommendations  Skilled nursing-short term rehab (<3 hours/day)    Assistance Recommended at Discharge Intermittent Supervision/Assistance  Equipment Recommendations  Other (comment) (defer to next venue of care)    Recommendations for Other Services      Precautions / Restrictions Precautions Precautions: Fall Precaution Comments: check O2 Restrictions Weight Bearing Restrictions: No Other Position/Activity Restrictions: Frequent repositioning needed; buttocks is raw/bleeding in spots R/L buttock from frequent BMs (SN aware)       Mobility                General bed mobility comments: Pt in recliner beginning/end of OT session Patient Response: Cooperative  Transfers Overall transfer level: Needs assistance Equipment used: Rolling Shaff (2 wheels) Transfers: Sit to/from Stand Sit to Stand: Modified independent (Device/Increase time) Stand pivot transfers: Min guard Step pivot transfers: Min assist       General transfer comment: set up of [place]     Balance Overall balance assessment: Needs assistance Sitting-balance support: Feet supported Sitting balance-Leahy Scale: Normal Sitting balance - Comments: Good sitting balance; able to tilt laterally to the L and reach with the R to perform toilet hygiene, supported by feet   Standing balance support: Bilateral upper extremity supported;During functional activity;Reliant on assistive device for balance Standing balance-Leahy Scale: Fair Standing balance comment: 1 LOB during amb, 1 LOB with dynamic standing while brushing teeth at sink; heavy reliance on 1 hand on countertop for stability  ADL either performed or assessed with clinical judgement   ADL Overall ADL's : Needs assistance/impaired     Grooming: Oral care;Set up;Standing;Min  guard Grooming Details (indicate cue type and reason): 1 LOB standing at sink, recovered with min A from OT; Pt kept 1 hand on countertop for support while brushing teeth to maintain stability         Upper Body Dressing : Set up Upper Body Dressing Details (indicate cue type and reason): hospital gown change     Toilet Transfer: Min guard;Rolling Busser (2 wheels);BSC/3in1   Toileting- Clothing Manipulation and Hygiene: Minimal assistance Toileting - Clothing Manipulation Details (indicate cue type and reason): Pt managed 90% of toilet hygiene in sitting to clean after a BM.  Pt transferred to standing for OT to check behind him and ensure thorough clean was completed.  Pt limited by pain d/t raw buttocks R/L/center     Functional mobility during ADLs: Minimal assistance;Rolling Colucci (2 wheels) General ADL Comments: 1 slight LOB when amb with RW from recliner to sink, 1 LOB while standing at sink while brushing teeth.  Both recovered with min A from OT.    Extremity/Trunk Assessment Upper Extremity Assessment Upper Extremity Assessment: Generalized weakness   Lower Extremity Assessment Lower Extremity Assessment: Generalized weakness   Cervical / Trunk Assessment Cervical / Trunk Assessment: Normal    Vision Baseline Vision/History: 1 Wears glasses Patient Visual Report: No change from baseline                Cognition Arousal/Alertness: Awake/alert Behavior During Therapy: WFL for tasks assessed/performed Overall Cognitive Status: Within Functional Limits for tasks assessed                                 General Comments: Pt alert and oriented to self, place, and situation.  Pt verbalizing fatigue.                      General Comments O2 2L, 98% at beginning of session at rest; desat to 87% following grooming in standing.  3 min rest while seated to toilet with return to 90%.  Assisted pt back to recliner and increased to 98%.    Pertinent  Vitals/ Pain       Pain Assessment: Faces Faces Pain Scale: Hurts little more Breathing: occasional labored breathing, short period of hyperventilation Negative Vocalization: none Facial Expression: facial grimacing Body Language: tense, distressed pacing, fidgeting Consolability: no need to console PAINAD Score: 4 Pain Location: buttocks when performing toilet hygiene Pain Descriptors / Indicators: Tender Pain Intervention(s): Monitored during session;Repositioned                                            Prior Functioning/Environment              Frequency  Min 2X/week        Progress Toward Goals  OT Goals(current goals can now be found in the care plan section)  Progress towards OT goals: Progressing toward goals  Acute Rehab OT Goals Patient Stated Goal: to return to [facility] SN[facility] OT Goal Formulation: With patient Time For Goal Achievement: 07/29/21 Potential to Achieve Goals: Good  Plan Discharge plan remains appropriate  AM-PAC OT "6 Clicks" Daily Activity     Outcome Measure   Help from another person eating meals?: None Help from another person taking care of personal grooming?: A Little Help from another person toileting, which includes using toliet, bedpan, or urinal?: A Lot Help from another person bathing (including washing, rinsing, drying)?: A Lot Help from another person to put on and taking off regular upper body clothing?: A Little Help from another person to put on and taking off regular lower body clothing?: A Lot 6 Click Score: 16    End of Session Equipment Utilized During Treatment: Rolling Vanorder (2 wheels);Oxygen;Gait belt  OT Visit Diagnosis: Unsteadiness on feet (R26.81);Muscle weakness (generalized) (M62.81)   Activity Tolerance Patient tolerated treatment well   Patient Left in chair;with call bell/phone within reach;with chair alarm set   Nurse Communication Mobility status         Time: 1040-1109 OT Time Calculation (min): 29 min  Charges: OT General Charges $OT Visit: 1 Visit OT Treatments $Self Care/Home Management : 23-37 mins  Leta Speller, MS, OTR/L   Darleene Cleaver 07/17/2021, 11:33 AM

## 2021-07-17 NOTE — Progress Notes (Signed)
f  PROGRESS NOTE  Trevor Ferguson    DOB: Feb 06, 1926, 87 y.o.  ZOX:[id number]  PCP: Trevor Carbon, MD   Code Status: DNR   DOA: 06/21/2021   LOS: 23  Brief Narrative of Current Hospitalization  Trevor Ferguson is a 87 y.o. male with a PMH significant for COPD, HTN, HLD, sleep apnea, CKD 3B, CVA. They presented from SNF to the ED on 06/21/2021 with abdominal pain x a few days. In the ED, it was found that they had small bowel diverticulitis with possible microperforation.  General surgery was consulted. 11/4 patient underwent exploratory laparotomy with small bowel resection 11/6 postoperative ileus 11/8 having bowel movements, 3 L O2 requirement, increased lethargy 11/10 O2 requirement 8-1X, metabolic acidosis improving with bicarb gtt, unable to tolerate PO 11/12 mental status improved. Metabolic acidosis resolved. Able to dc bicarb drip 11/26 seen by pulmonology, recs per consult note 11/28 returned to baseline O2 requirement with worsening kidney function 07/17/21 -stable, improved  Assessment & Plan  Active Problems:   COPD with acute exacerbation ([place])   Acute diverticulitis   AKI (acute kidney injury) ([place])   Diverticulitis   Hypoxia   Perforation of intestine due to diverticulitis of gastrointestinal tract   HAP (hospital-acquired pneumonia)   Abdominal distension (gaseous)   Pressure injury of skin   Dyspnea   Acute on chronic respiratory failure with hypoxia ([facility])   Acute diastolic congestive heart failure ([place])   Pulmonary edema  Acute small bowel diverticulitis- s/p resection 91/4 complicated by postoperative ileus.  Now having normal Bms. Tolerating soft diet without abdominal pain or nausea. Possible that abdominal pain interfering with respirations. -Surgery following, appreciate recommendations -tolerating diet well. Continue soft diet until OP surgery follow up -Zosyn per general surgery (11/8-11/10), flagyl (11/4-11/8) - gentle bowel regimen, imodium PRN    Acute hypoxic respiratory failure 2/2 atelectasis versus early pneumonia  COPD - Patient endorses using breathing treatment and on 2L O2 at baseline. Will likely need new higher requirement on discharge. Chest xray 11/21 showed signs consistent with fluid overload and unchanged on repeat cxray 11/24, 11/26.  WBC 9>17.5>8.5, likely to increase now restarted on steroids. Pulse ox showed increased requirement of 6L O2 with exertion to keep O2 sats >95%. Patient meets requirements to go back to his facility - completed HAP treatment             - azithromycin (11/9-11/14)             - CTX (11/12-11/16) - Lasix drip transitioned to PO torsemide + aldactone + metolazone  - torsemide held today for renal function - PT/OT - flonase daily - pulmonology following, appreciate recs  - restarted steroids and azithromycin for COPD exacerbation - anoro, flonase - got weight today so patient will have dry weight. Recommend he get weighed every day at SNF and take torsemide if gaining >2 lbs from dry weight. This is in compromise of renal rest and preventing pulmonary congestion.   Encephalopathy- resolved. Conversant and oriented today. - palliative consulted.              - meeting with family to discuss [place] 11/11- continue current course of treatment and dc with hospice/palliative to facility - continue PT/OT - attempt to get out of bed and limit night time interference to avoid delirium.    AKI on CKD IIIb  metabolic acidosis (resolved)  mild hypokalemia (resolved)- Cr 3.7>>>1.98>2.47>2.35. K+ 4.5. slight improvement again with decreasing some diuresis.  -Nephrology following,  appreciate recommendations -Monitor serum bicarb/electrolytes/Cr daily, BMP am - replete electrolytes PRN - got weight today so patient will have dry weight. Recommend he get weighed every day at SNF and take torsemide if gaining >2 lbs from dry weight. This is in compromise of renal rest and preventing pulmonary congestion.  Patient and family in agreement with this plan. As long as Cr is trending in direction of improvement 11/30, and his respiratory status remains same, he should be discharged back to his facility.    Primary HTN-well-controlled with baseline low diastolic readings -discontinued amlodipine to help prevent vascular leaking -Holding home ACE inhibitor due to kidney function   Depression -Continue home Lexapro   GERD -Continue home PPI  Chronic iron deficiency anemia -Continue home iron  DVT prophylaxis: Place and maintain sequential compression device Start: 07/04/21 1447 heparin injection 5,000 Units Start: 06/27/21 0600   Diet:  Diet Orders (From admission, onward)     Start     Ordered   07/05/21 0000  Diet - low sodium heart healthy        07/05/21 1257   06/30/21 1443  DIET SOFT Room service appropriate? Yes with Assist; Fluid consistency: Thin  Diet effective now       Comments: Please CUT the meats and add Gravy;  NO STRAWS!!! CREAM SOUPS ONLY  Question Answer Comment  Room service appropriate? Yes with Assist   Fluid consistency: Thin      06/30/21 1443            Subjective 07/17/21    Pt doing very well today. He is on baseline O2 with no distress at rest. He has good urine output.   Disposition Plan & Communication  Patient status: Inpatient  Admitted From: SNF Disposition: Skilled nursing facility Anticipated discharge date: hopefully 11/30. This is pending downward trend of Cr in the am and maintains oxygen requirement with exertion less than 6L.  Family Communication: daughter and wife at bedside Consults, Procedures, Significant Events  Consultants:  Nephrology General surgery Palliative  Pulmonology    Procedures/significant events:  Small bowel resection 11/4  Antimicrobials:  Anti-infectives (From admission, onward)    Start     Dose/Rate Route Frequency Ordered Stop   07/14/21 1630  azithromycin (ZITHROMAX) tablet 500 mg  Status:   Discontinued        500 mg Oral Daily 07/14/21 1536 07/16/21 1731   06/30/21 1300  cefTRIAXone (ROCEPHIN) 2 g in sodium chloride 0.9 % 100 mL IVPB        2 g 200 mL/hr over 30 Minutes Intravenous Every 24 hours 06/30/21 1205 07/03/21 1615   06/27/21 1400  azithromycin (ZITHROMAX) 250 mg in dextrose 5 % 125 mL IVPB        250 mg 125 mL/hr over 60 Minutes Intravenous Every 24 hours 06/27/21 1239 07/01/21 1759   06/26/21 1800  piperacillin-tazobactam (ZOSYN) IVPB 2.25 g  Status:  Discontinued        2.25 g 100 mL/hr over 30 Minutes Intravenous Every 6 hours 06/26/21 1604 06/28/21 0748   06/22/21 2100  ceFEPIme (MAXIPIME) 2 g in sodium chloride 0.9 % 100 mL IVPB  Status:  Discontinued        2 g 200 mL/hr over 30 Minutes Intravenous Every 24 hours 06/21/21 2331 06/26/21 1604   06/22/21 0600  metroNIDAZOLE (FLAGYL) IVPB 500 mg  Status:  Discontinued        500 mg 100 mL/hr over 60 Minutes Intravenous Every 8 hours 06/21/21 2331  06/26/21 1604   06/21/21 2100  ceFEPIme (MAXIPIME) 2 g in sodium chloride 0.9 % 100 mL IVPB        2 g 200 mL/hr over 30 Minutes Intravenous  Once 06/21/21 2050 06/21/21 2130   06/21/21 2100  metroNIDAZOLE (FLAGYL) IVPB 500 mg        500 mg 100 mL/hr over 60 Minutes Intravenous  Once 06/21/21 2050 06/22/21 1616       Objective   Vitals:   07/17/21 1445 07/17/21 1448 07/17/21 1523 07/17/21 1526  BP:   (!) 157/72 (!) 152/84  Pulse:   70 88  Resp:   20 20  Temp:   (!) 97.3 F (36.3 C) 98.3 F (36.8 C)  TempSrc:      SpO2: 93% 93% 97% 94%  Weight:      Height:        Intake/Output Summary (Last 24 hours) at 07/17/2021 1700 Last data filed at 07/17/2021 1340 Gross per 24 hour  Intake 480 ml  Output 2950 ml  Net -2470 ml    Filed Weights   06/22/21 2134 06/26/21 1549 06/27/21 0458  Weight: 96.2 kg 103.6 kg 104.4 kg    Patient BMI: Body mass index is 34.49 kg/m.   Physical Exam: General: awake, alert, NAD HEENT: atraumatic, clear conjunctiva,  anicteric sclera, moist mucus membranes, hard of hearing Respiratory: normal respiratory effort. Cardiovascular:quick capillary refill  Gastrointestinal: soft, NT, ND, no HSM felt Nervous: Alert. no gross focal neurologic deficits Extremities: moves all equally, no edema, normal tone Skin: dry, intact, normal temperature, normal color, No rashes, lesions or ulcers Psychiatry: normal mood, congruent affect  Labs   CBC    Component Value Date/Time   WBC 8.5 07/15/2021 0538   RBC 3.01 (L) 07/15/2021 0538   HGB 9.4 (L) 07/15/2021 0538   HGB 12.8 (L) 03/19/2014 1049   HCT 28.2 (L) 07/15/2021 0538   HCT 37.7 (L) 03/19/2014 1049   PLT 179 07/15/2021 0538   PLT 137 (L) 03/19/2014 1049   MCV 93.7 07/15/2021 0538   MCV 97 03/19/2014 1049   MCH 31.2 07/15/2021 0538   MCHC 33.3 07/15/2021 0538   RDW 15.1 07/15/2021 0538   RDW 13.2 03/19/2014 1049   LYMPHSABS 0.5 (L) 06/27/2021 0334   MONOABS 1.6 (H) 06/27/2021 0334   EOSABS 0.1 06/27/2021 0334   BASOSABS 0.1 06/27/2021 0334   BMP Latest Ref Rng & Units 07/17/2021 07/16/2021 07/15/2021  Glucose 70 - 99 mg/dL 267(H) 256(H) 205(H)  BUN 8 - 23 mg/dL 109(H) 102(H) 94(H)  Creatinine 0.61 - 1.24 mg/dL 2.35(H) 2.47(H) 2.24(H)  Sodium 135 - 145 mmol/L 134(L) 136 138  Potassium 3.5 - 5.1 mmol/L 4.5 4.2 3.8  Chloride 98 - 111 mmol/L 93(L) 92(L) 95(L)  CO2 22 - 32 mmol/L 28 32 33(H)  Calcium 8.9 - 10.3 mg/dL 8.7(L) 8.3(L) 8.2(L)    Imaging Studies  No results found. Medications   Scheduled Meds:  acidophilus  2 capsule Oral TID   albuterol  2.5 mg Nebulization TID   cholecalciferol  1,000 Units Oral Daily   escitalopram  10 mg Oral Daily   famotidine  10 mg Oral BID   feeding supplement  237 mL Oral TID BM   ferrous sulfate  325 mg Oral Q breakfast   fluticasone  2 spray Each Nare Daily   guaiFENesin  1,200 mg Oral BID   heparin injection (subcutaneous)  5,000 Units Subcutaneous Q8H   mouth rinse  15 mL Mouth Rinse  BID   metolazone   5 mg Oral Once per day on Mon Wed Fri   multivitamin with minerals  1 tablet Oral Daily   [START ON 07/18/2021] predniSONE  20 mg Oral Q breakfast   SM Double Antibiotic  1 application Topical BID   traZODone  150 mg Oral QHS   umeclidinium-vilanterol  2 puff Inhalation Daily   No recently discontinued medications to reconcile  LOS: 26 days   Time spent: >46min  Delphina Schum L Nahima Ales, DO [facility] 07/17/2021, 5:00 PM   Please refer to amion to contact the [facility] Attending or Consulting provider for this pt  [email or web address] Available by Epic secure chat 7AM-7PM. If 7PM-7AM, please contact night-coverage

## 2021-07-18 LAB — BASIC METABOLIC PANEL
Anion gap: 7 (ref 5–15)
BUN: 100 mg/dL — ABNORMAL HIGH (ref 8–23)
CO2: 31 mmol/L (ref 22–32)
Calcium: 9.1 mg/dL (ref 8.9–10.3)
Chloride: 97 mmol/L — ABNORMAL LOW (ref 98–111)
Creatinine, Ser: 2.09 mg/dL — ABNORMAL HIGH (ref 0.61–1.24)
GFR, Estimated: 29 mL/min — ABNORMAL LOW (ref >60)
Glucose, Bld: 138 mg/dL — ABNORMAL HIGH (ref 70–99)
Potassium: 3.9 mmol/L (ref 3.5–5.1)
Sodium: 135 mmol/L (ref 135–145)

## 2021-07-18 MED ORDER — ENSURE ENLIVE PO LIQD: 237.0000 mL | Freq: Two times a day (BID) | ORAL | 12 refills | Status: DC

## 2021-07-18 MED ORDER — SM DOUBLE ANTIBIOTIC 500-10000 UNIT/GM EX OINT: 1.0000 "application " | TOPICAL_OINTMENT | Freq: Two times a day (BID) | CUTANEOUS | Status: DC

## 2021-07-18 MED ORDER — TORSEMIDE 60 MG PO TABS: 60.0000 mg | ORAL_TABLET | Freq: Every day | ORAL | Status: DC

## 2021-07-18 MED ORDER — FERROUS SULFATE 325 (65 FE) MG PO TABS: 325.0000 mg | ORAL_TABLET | Freq: Every day | ORAL | 3 refills | Status: DC

## 2021-07-18 MED ORDER — SALINE SPRAY 0.65 % NA SOLN: 1.0000 | NASAL | 0 refills | Status: DC | PRN

## 2021-07-18 MED ORDER — SIMETHICONE 80 MG PO CHEW: 80.0000 mg | CHEWABLE_TABLET | Freq: Four times a day (QID) | ORAL | 0 refills | Status: DC | PRN

## 2021-07-18 MED ORDER — AMLODIPINE BESYLATE 5 MG PO TABS: 5.0000 mg | ORAL_TABLET | Freq: Every day | ORAL | Status: DC

## 2021-07-18 MED ORDER — UMECLIDINIUM-VILANTEROL 62.5-25 MCG/ACT IN AEPB: 2.0000 | INHALATION_SPRAY | Freq: Every day | RESPIRATORY_TRACT | Status: DC

## 2021-07-18 MED ORDER — LOPERAMIDE HCL 2 MG PO CAPS
2.0000 mg | ORAL_CAPSULE | ORAL | 0 refills | Status: DC | PRN
Start: 2021-07-18 — End: 2022-06-24

## 2021-07-18 MED ORDER — ENSURE ENLIVE PO LIQD: 237.0000 mL | Freq: Two times a day (BID) | ORAL | Status: DC

## 2021-07-18 MED ORDER — FAMOTIDINE 10 MG PO TABS
10.0000 mg | ORAL_TABLET | Freq: Two times a day (BID) | ORAL | Status: AC
Start: 2021-07-18 — End: ?

## 2021-07-18 MED ORDER — ACETAMINOPHEN 325 MG PO TABS: 650.0000 mg | ORAL_TABLET | Freq: Four times a day (QID) | ORAL | Status: AC | PRN

## 2021-07-18 MED ORDER — FLUTICASONE PROPIONATE 50 MCG/ACT NA SUSP: 2.0000 | Freq: Every day | NASAL | 2 refills | Status: DC

## 2021-07-18 MED ORDER — PREDNISONE 10 MG PO TABS: ORAL_TABLET | ORAL | 0 refills | Status: AC

## 2021-07-18 MED ORDER — TORSEMIDE 20 MG PO TABS
60.0000 mg | ORAL_TABLET | Freq: Every day | ORAL | Status: DC
  Administered 2021-07-18: 60 mg via ORAL
  Filled 2021-07-18: qty 3

## 2021-07-18 NOTE — Discharge Summary (Signed)
Physician Discharge Summary  Trevor Ferguson UJW:[id number] DOB: 1925/12/28 DOA: 06/21/2021  PCP: [name], MD  Admit date: 06/21/2021 Discharge date: 07/18/2021  Admitted From: [facility] Disposition:  [facility]  Recommendations for Outpatient Follow-up:  Follow up with PCP in 1-2 weeks Please obtain BMP/CBC in one week Please follow up with nephrology in 1 to 2 weeks Please follow-up with general surgery as scheduled, call for appointment if needed Follow-up on patient's blood pressure 6.   Palliative care to follow at facility  Home Health: No Equipment/Devices: None  Discharge Condition: Stable CODE STATUS: DNR Diet recommendation: Heart Healthy     Discharge Diagnoses: Active Problems:   COPD with acute exacerbation (HCC)   Acute diverticulitis   AKI (acute kidney injury) ([place])   Diverticulitis   Hypoxia   Perforation of intestine due to diverticulitis of gastrointestinal tract   HAP (hospital-acquired pneumonia)   Abdominal distension (gaseous)   Pressure injury of skin   Dyspnea   Acute on chronic respiratory failure with hypoxia (HCC)   Acute diastolic congestive heart failure (HCC)   Pulmonary edema    Summary of HPI and Hospital Course:   Trevor Ferguson is a 87 y.o. male with a PMH significant for COPD, HTN, HLD, sleep apnea, CKD 3B, CVA. He presented from SNF to the ED on 06/21/2021 with abdominal pain x a few days.  In the ED, it was found that they had small bowel diverticulitis with possible microperforation. General surgery was consulted. 11/4 patient underwent exploratory laparotomy with small bowel resection 11/6 postoperative ileus 11/8 having bowel movements, 3 L O2 requirement, increased lethargy 11/10 O2 requirement 5-6O, metabolic acidosis improving with bicarb gtt, unable to tolerate PO 11/12 mental status improved. Metabolic acidosis resolved. Able to dc bicarb drip 11/26 seen by pulmonology for COPD with acute exacerbation,  started on steroids 11/28 returned to baseline O2 requirement with worsening kidney function, nephrology following 07/17/21 -stable, improved  07/18/2021 Patient's renal function has improved.   He is being weaned off prednisone with short taper prescribed at discharge.   Respiratory status stable on baseline oxygen requirement. Medically stable for discharge to SNF.     Acute small bowel diverticulitis- s/p resection 13/0 complicated by postoperative ileus (resolved).   Now having normal BM's. Tolerating soft diet without abdominal pain or nausea.  -Surgery consulted, appreciate recommendations  -Continue soft diet until OP surgery follow up -Completed antibiotics per general surgery: Zosyn (11/8-11/10), flagyl (11/4-11/8)   Acute hypoxic respiratory failure 2/2 atelectasis versus early pneumonia  COPD - Patient endorses using breathing treatment and on 2L O2 at baseline. Will likely need new higher requirement on discharge. Chest xray 11/21 showed signs consistent with fluid overload and unchanged on repeat cxray 11/24, 11/26.  WBC 9>17.5>8.5, likely to increase now restarted on steroids.  Pulse ox stable in the mid 90s on 2 to 2.5 L/min nasal cannula oxygen. -Supplemental oxygen as needed to maintain sats 88 to 93% (baseline 2 L/min) - completed HAP treatment -  - azithromycin (11/9-11/14)             - CTX (11/12-11/16) - Lasix drip transitioned to PO torsemide + aldactone + metolazone             -Discharge on torsemide 60 mg daily, close nephrology follow-up - flonase daily - pulmonology following, appreciate recs             - restarted steroids and azithromycin for COPD exacerbation  -11/30: Off antibiotics and  tapering prednisone at discharge - Anoro, flonase   Encephalopathy- resolved, mental status appears back to baseline.  - palliative consulted, had meeting with family to discuss [place] 11/11 - continue current course of treatment and dc with hospice/palliative to  facility - continue PT/OT at SNF   AKI on CKD IIIb  metabolic acidosis (resolved)  mild hypokalemia (resolved)-  Cr 3.7>>>1.98>2.47>2.35>>2.09.  K+ 4.5>>3.9.   -Nephrology following, follow-up in clinic in 1 to 2 weeks -Recheck BMP and Mg level within 1 week - replete electrolytes PRN --Continue daily weights   Primary HTN- well-controlled with baseline low diastolic readings --Continue holding losartan at discharge until nephrology follow-up --Amlodipine resumed at discharge for better BP control, consider stopping due to edema --Torsemide 60 mg daily per nephrology   Depression -Continue home Lexapro   GERD -Continue home PPI  Chronic iron deficiency anemia -Continue home iron   Discharge Instructions   Discharge Instructions     (HEART FAILURE PATIENTS) Call MD:  Anytime you have any of the following symptoms: 1) 3 pound weight gain in 24 hours or 5 pounds in 1 week 2) shortness of breath, with or without a dry hacking cough 3) swelling in the hands, feet or stomach 4) if you have to sleep on extra pillows at night in order to breathe.   Complete by: As directed    Call MD for:  extreme fatigue   Complete by: As directed    Call MD for:  persistant dizziness or light-headedness   Complete by: As directed    Call MD for:  persistant nausea and vomiting   Complete by: As directed    Call MD for:  redness, tenderness, or signs of infection (pain, swelling, redness, odor or green/yellow discharge around incision site)   Complete by: As directed    Call MD for:  severe uncontrolled pain   Complete by: As directed    Call MD for:  temperature >100.4   Complete by: As directed    Diet - low sodium heart healthy   Complete by: As directed    Diet - low sodium heart healthy   Complete by: As directed    Discharge instructions   Complete by: As directed    Follow up Arnold Long, MD in office in one week for staples removal   Discharge instructions   Complete by: As  directed    Please take all medications as prescribed.   Prednisone will be tapered over the next 4 days - 20 mg tomorrow and Friday, 10 mg on Sat and Sun, then stop. If you have worsening shortness of breath or wheezing, please contact your doctor.  Please follow up with Nephrology in 1-2 weeks in clinic to check on your kidney function.   Discharge wound care:   Complete by: As directed    Monitor buttock pressure injury closely.  Cleanse daily, keep area clean and dry. Frequently reposition - at least every two hours.   Increase activity slowly   Complete by: As directed    Increase activity slowly   Complete by: As directed    No wound care   Complete by: As directed       Allergies as of 07/18/2021       Reactions   Penicillins Hives, Rash, Other (See Comments)   Rash on hands and blisters on ankles when 87 years old Patient tolerates Zosyn   Bee Venom Palpitations   INSECT BITES/STINGS   Darvon [propoxyphene] Nausea Only, Rash  Medication List     STOP taking these medications    azithromycin 250 MG tablet Commonly known as: ZITHROMAX   losartan-hydrochlorothiazide 100-25 MG tablet Commonly known as: HYZAAR   potassium chloride 10 MEQ tablet Commonly known as: KLOR-CON M   Prevagen 10 MG Caps Generic drug: Apoaequorin       TAKE these medications    acetaminophen 325 MG tablet Commonly known as: TYLENOL Take 2 tablets (650 mg total) by mouth every 6 (six) hours as needed for mild pain (or Fever >/= 101).   acidophilus Caps capsule Take 2 capsules by mouth 3 (three) times daily.   albuterol 108 (90 Base) MCG/ACT inhaler Commonly known as: VENTOLIN HFA Inhale 1-2 puffs into the lungs every 6 (six) hours as needed for wheezing or shortness of breath.   albuterol 0.63 MG/3ML nebulizer solution Commonly known as: ACCUNEB 3 (three) times daily as needed.   amLODipine 5 MG tablet Commonly known as: NORVASC Take 1 tablet (5 mg total) by  mouth daily.   budesonide 0.5 MG/2ML nebulizer solution Commonly known as: PULMICORT Take 0.5 mg by nebulization daily.   escitalopram 10 MG tablet Commonly known as: LEXAPRO Take 10 mg by mouth daily.   famotidine 10 MG tablet Commonly known as: PEPCID Take 1 tablet (10 mg total) by mouth 2 (two) times daily. What changed:  medication strength how much to take when to take this reasons to take this   feeding supplement Liqd Take 237 mLs by mouth 2 (two) times daily between meals. Start taking on: July 19, 2021   ferrous sulfate 325 (65 FE) MG tablet Take 1 tablet (325 mg total) by mouth daily with breakfast. What changed:  medication strength how much to take when to take this   fluticasone 50 MCG/ACT nasal spray Commonly known as: FLONASE Place 2 sprays into both nostrils daily. Start taking on: July 19, 2021 What changed:  when to take this reasons to take this   loperamide 2 MG capsule Commonly known as: IMODIUM Take 1 capsule (2 mg total) by mouth as needed for diarrhea or loose stools.   loratadine 10 MG tablet Commonly known as: CLARITIN Take 10 mg by mouth daily.   melatonin 5 MG Tabs Take 5 mg by mouth at bedtime.   omeprazole 20 MG capsule Commonly known as: PRILOSEC Take 20 mg by mouth daily.   ondansetron 4 MG tablet Commonly known as: ZOFRAN Take 4 mg by mouth every 8 (eight) hours as needed for nausea or vomiting.   oxyCODONE-acetaminophen 5-325 MG tablet Commonly known as: PERCOCET/ROXICET Take 1 tablet by mouth every 6 (six) hours as needed for moderate pain.   predniSONE 10 MG tablet Commonly known as: DELTASONE Take 2 tablets (20 mg total) by mouth daily with breakfast for 2 days, THEN 1 tablet (10 mg total) daily with breakfast for 2 days. Start taking on: July 19, 2021   simethicone 80 MG chewable tablet Commonly known as: MYLICON Chew 1 tablet (80 mg total) by mouth 4 (four) times daily as needed for flatulence. What  changed:  medication strength how much to take when to take this   SM Double Antibiotic 500-10000 UNIT/GM Oint Apply 1 application topically 2 (two) times daily.   sodium chloride 0.65 % Soln nasal spray Commonly known as: OCEAN Place 1 spray into both nostrils as needed for congestion (nose irritation).   Torsemide 60 MG Tabs Take 60 mg by mouth daily. Start taking on: July 19, 2021 What changed:  medication strength how much to take when to take this reasons to take this   traZODone 150 MG tablet Commonly known as: DESYREL Take 150 mg by mouth at bedtime.   triamcinolone cream 0.1 % Commonly known as: KENALOG Apply 1 application topically 2 (two) times daily.   umeclidinium-vilanterol 62.5-25 MCG/ACT Aepb Commonly known as: ANORO ELLIPTA Inhale 2 puffs into the lungs daily. Start taking on: July 19, 2021   Vitamin D-3 25 MCG (1000 UT) Caps Take 1,000 Units by mouth daily.               Discharge Care Instructions  (From admission, onward)           Start     Ordered   07/18/21 0000  Discharge wound care:       Comments: Monitor buttock pressure injury closely.  Cleanse daily, keep area clean and dry. Frequently reposition - at least every two hours.   07/18/21 1506            Contact information for follow-up providers     Lavonia Dana, MD. Go on 08/01/2021.   Specialty: Nephrology Why: 2pm appointment Contact information: [address] [place] [address] [phone]         Herbert Pun, MD. Schedule an appointment as soon as possible for a visit.   Specialty: General Surgery Contact information: [place] [address] [phone]         [name], MD Follow up.   Specialties: Internal Medicine, Pediatrics Why: Hospital follow up Contact information: [place] [address] [phone]              Contact information for  after-discharge care     Destination     [facility] .   Service: Skilled Nursing Contact information: [place] [address] [phone]                    Allergies  Allergen Reactions   Penicillins Hives, Rash and Other (See Comments)    Rash on hands and blisters on ankles when 87 years old Patient tolerates Zosyn   Bee Venom Palpitations    INSECT BITES/STINGS   Darvon [Propoxyphene] Nausea Only and Rash     If you experience worsening of your admission symptoms, develop shortness of breath, life threatening emergency, suicidal or homicidal thoughts you must seek medical attention immediately by calling 911 or calling your MD immediately  if symptoms less severe.    Please note   You were cared for by a hospitalist during your hospital stay. If you have any questions about your discharge medications or the care you received while you were in the hospital after you are discharged, you can call the unit and asked to speak with the hospitalist on call if the hospitalist that took care of you is not available. Once you are discharged, your primary care physician will handle any further medical issues. Please note that NO REFILLS for any discharge medications will be authorized once you are discharged, as it is imperative that you return to your primary care physician (or establish a relationship with a primary care physician if you do not have one) for your aftercare needs so that they can reassess your need for medications and monitor your lab values.   Consultations: General surgery Nephrology Palliative care Pulmonology   Procedures/Studies: CT ABDOMEN PELVIS WO CONTRAST  Result  Date: 06/26/2021 CLINICAL DATA:  Status post small bowel resection for small bowel diverticulitis. Worsening clinical condition. EXAM: CT ABDOMEN AND PELVIS WITHOUT CONTRAST TECHNIQUE: Multidetector CT imaging of the abdomen and pelvis was  performed following the standard protocol without IV contrast. COMPARISON:  06/21/2021 FINDINGS: Lower chest: Minimal dependent atelectasis or infiltrate in both lungs with tiny right effusion. Hepatobiliary: No focal abnormality in the liver on this study without intravenous contrast. Gallbladder is markedly distended. No intrahepatic or extrahepatic biliary dilation. Pancreas: No focal mass lesion. No dilatation of the main duct. No intraparenchymal cyst. No peripancreatic edema. Spleen: No splenomegaly. No focal mass lesion. Adrenals/Urinary Tract: No adrenal nodule or mass. Right kidney unremarkable. 2 cm low-density lesion interpolar left kidney is stable in the interval, likely a cyst. No evidence for hydroureter. The urinary bladder appears normal for the degree of distention. Stomach/Bowel: Stomach is unremarkable. No gastric wall thickening. No evidence of outlet obstruction. Duodenum is normally positioned as is the ligament of Treitz. Duodenal diverticulum noted. No small bowel wall thickening. No small bowel dilatation. Left abdominal anastomosis is unremarkable without evidence of wall thickening, pneumatosis, or adjacent fluid/extraluminal gas. No small bowel dilatation. The terminal ileum is normal. Right colon is mildly distended with gas and fluid. Left colon is nondilated. Diverticular change noted in the left colon without diverticulitis. Vascular/Lymphatic: There is moderate atherosclerotic calcification of the abdominal aorta without aneurysm. There is no gastrohepatic or hepatoduodenal ligament lymphadenopathy. No retroperitoneal or mesenteric lymphadenopathy. No pelvic sidewall lymphadenopathy. Reproductive: Brachytherapy seeds noted in the prostate gland. Other: No intraperitoneal free fluid.  No intraperitoneal free air. Musculoskeletal: Diffuse body wall edema. No worrisome lytic or sclerotic osseous abnormality. IMPRESSION: 1. Left abdominal small bowel anastomosis is unremarkable. No  evidence for wall thickening, pneumatosis, or adjacent fluid/extraluminal gas. No free fluid in the peritoneal cavity. No small bowel dilatation. 2. Marked gallbladder distension. 3. Right colon is mildly distended with gas and fluid. Imaging features are nonspecific. 4. Left colonic diverticulosis without diverticulitis. 5. Diffuse body wall edema. 6. Aortic Atherosclerosis (ICD10-I70.0). Electronically Signed   By: Misty Stanley M.D.   On: 06/26/2021 18:28   CT ABDOMEN PELVIS WO CONTRAST  Result Date: 06/21/2021 CLINICAL DATA:  87 year old with abdominal distension and right lower quadrant pain EXAM: CT ABDOMEN AND PELVIS WITHOUT CONTRAST TECHNIQUE: Multidetector CT imaging of the abdomen and pelvis was performed following the standard protocol without IV contrast. COMPARISON:  CT 03/10/2020 FINDINGS: Lower chest: Subpleural reticular opacity and ground-glass in the right greater than left lower lobe. Resolution of previous pleural effusions. Hepatobiliary: No focal hepatic abnormality on this unenhanced exam. Unremarkable gallbladder. No calcified gallstone. No biliary dilatation. Pancreas: Age related fatty atrophy No ductal dilatation or inflammation. Spleen: Normal in size without focal abnormality. Adrenals/Urinary Tract: Normal adrenal glands. No hydronephrosis. Small upper pole right renal calculus. Small left renal cyst. Unremarkable urinary bladder. Stomach/Bowel: Decompressed stomach. Fluid-filled duodenal diverticulum without inflammation. There is no inflamed small bowel diverticula in the right abdomen, series 5, image 32 hand series 2, image 56. Adjacent fluid-filled small bowel and mesenteric edema. Small amount of mottled gas in the adjacent mesentery, series 2, image 54. This does not appear to be within the mesenteric vasculature. There is no small bowel pneumatosis. Regional small bowel are mildly prominent fluid-filled suggesting regional ileus. Fluid within the cecum and ascending colon.  There is formed stool in the more distal colon. Descending and sigmoid colonic diverticulosis without colonic diverticulitis. Vascular/Lymphatic: Aortic atherosclerosis. No aortic aneurysm. There is no  portal venous gas. No abdominopelvic adenopathy. Reproductive: Brachytherapy seeds in the prostate. Other: Inflammatory changes with stranding and mesenteric edema in the right small bowel mesentery. Small amount of mottled extraluminal gas related small bowel diverticulitis. No abdominopelvic ascites. No focal or drainable collection. Fat containing bilateral inguinal hernias. Musculoskeletal: Thoracolumbar spine degenerative change. There are no acute or suspicious osseous abnormalities. IMPRESSION: 1. Recurrent acute small bowel diverticulitis in the right abdomen. Small amount of mottled extraluminal gas in the adjacent mesentery consistent with microperforation. No focal or drainable collection. 2. Regional small bowel ileus. 3. Colonic diverticulosis without diverticulitis. 4. Nonobstructing right nephrolithiasis. 5. Subpleural reticular opacity and ground-glass in the right greater than left lower lobe, favoring post infectious/inflammatory sequela. Aortic Atherosclerosis (ICD10-I70.0). Critical Value/emergent results were called by telephone at the time of interpretation on 06/21/2021 at 8:28 pm to provider [facility] , who verbally acknowledged these results. Electronically Signed   By: Keith Rake M.D.   On: 06/21/2021 20:29   DG Chest 2 View  Result Date: 07/12/2021 CLINICAL DATA:  Chest pain, cough, and shortness of breath. EXAM: CHEST - 2 VIEW COMPARISON:  Chest x-ray dated July 09, 2021. FINDINGS: Stable cardiomediastinal silhouette. Chronically coarsened interstitial markings. Unchanged mild bibasilar atelectasis and small bilateral pleural effusions. No pneumothorax. No acute osseous abnormality. IMPRESSION: 1. Unchanged mild bibasilar atelectasis and small bilateral pleural effusions.  Electronically Signed   By: Titus Dubin M.D.   On: 07/12/2021 11:20   DG Chest 2 View  Result Date: 07/09/2021 CLINICAL DATA:  Shortness of breath. History of COPD, hypertension pneumonia. Former smoker EXAM: CHEST - 2 VIEW COMPARISON:  None. FINDINGS: The heart is enlarged. Hyperinflated lungs with reticular opacities in the right middle lung as well as bilateral lung bases are unchanged. Bilateral pleural effusions, left greater than right with bibasilar atelectasis is also unchanged. Osteopenia with mild thoracic kyphosis. IMPRESSION: 1.  Stable cardiomegaly. 2. COPD. Reticular opacity in right middle lung as well as in bilateral lung bases with small bilateral pleural effusions concerning for pulmonary edema/infiltrate or chronic interstitial lung disease, not significantly changed. Electronically Signed   By: Keane Police D.O.   On: 07/09/2021 11:02   CT HEAD WO CONTRAST (5MM)  Result Date: 06/26/2021 CLINICAL DATA:  Mental status change, unknown cause EXAM: CT HEAD WITHOUT CONTRAST TECHNIQUE: Contiguous axial images were obtained from the base of the skull through the vertex without intravenous contrast. COMPARISON:  06/23/2021 FINDINGS: Brain: No evidence of acute infarction, hemorrhage, cerebral edema, mass, mass effect, or midline shift. Ventricles and sulci are within normal limits for age. No extra-axial fluid collection. Periventricular white matter changes, likely the sequela of chronic small vessel ischemic disease. Remote lacunar infarct in the left basal ganglia Vascular: No hyperdense vessel. Atherosclerotic calcifications in the intracranial carotid and vertebral arteries. Skull: Normal. Negative for fracture or focal lesion. Sinuses/Orbits: Mucosal thickening in the ethmoid air cells. Status post bilateral lens replacements. Other: The mastoid air cells are well aerated. IMPRESSION: No acute intracranial process. Electronically Signed   By: Merilyn Baba M.D.   On: 06/26/2021 18:15    CT HEAD WO CONTRAST (5MM)  Result Date: 06/23/2021 CLINICAL DATA:  Unwitnessed fall. EXAM: CT HEAD WITHOUT CONTRAST TECHNIQUE: Contiguous axial images were obtained from the base of the skull through the vertex without intravenous contrast. COMPARISON:  Head CT 03/07/2019 FINDINGS: Brain: Age related atrophy and chronic small vessel ischemia. No intracranial hemorrhage, mass effect, or midline shift. No hydrocephalus. The basilar cisterns are patent. No evidence  of territorial infarct or acute ischemia. No extra-axial or intracranial fluid collection. Vascular: Atherosclerosis of skullbase vasculature without hyperdense vessel or abnormal calcification. Skull: No fracture or focal lesion. Sinuses/Orbits: Chronic opacification of right ethmoid air cells. Trace mucosal thickening in left side of sphenoid sinus. Bilateral cataract resection Other: No confluent scalp hematoma. IMPRESSION: 1. No acute intracranial abnormality. No skull fracture. 2. Age related atrophy and chronic small vessel ischemia. Electronically Signed   By: Keith Rake M.D.   On: 06/23/2021 22:35   US RENAL  Result Date: 06/25/2021 CLINICAL DATA:  Acute kidney insufficiency EXAM: RENAL / URINARY TRACT ULTRASOUND COMPLETE COMPARISON:  CT abdomen 06/21/2021 FINDINGS: Right Kidney: Renal measurements: 10.7 x 5.1 x 5.5 cm = volume: 158 mL. Increased echogenicity with cortical thinning. 10 mm nonobstructing calculus in the upper pole. No hydronephrosis. Left Kidney: Renal measurements: 13.4 x 6 x 5.6 cm = volume: 236 mL. Increased echogenicity with cortical thinning in the upper and midpole. Anechoic cysts measuring up to 1.7 cm in the upper pole. No nephrolithiasis or hydronephrosis identified. Bladder: Appears normal for degree of bladder distention. Other: None. IMPRESSION: 1. Evidence of medical renal disease. 2. Right renal calculus. 3. Left renal cysts. Electronically Signed   By: Ofilia Neas M.D.   On: 06/25/2021 11:05   DG  Chest Port 1 View  Result Date: 07/14/2021 CLINICAL DATA:  Difficulty breathing EXAM: PORTABLE CHEST 1 VIEW COMPARISON:  Previous studies including the examination of 07/12/2021 FINDINGS: Transverse diameter of heart is increased. Central pulmonary vessels are more prominent. Increased interstitial markings are seen in parahilar regions with interval worsening. There are linear densities and increased interstitial markings in both lower lung fields with interval worsening. There is blunting of left lateral CP angle. There is no pneumothorax. IMPRESSION: Increased interstitial markings are seen in the parahilar regions and lower lung fields with interval worsening suggesting pulmonary edema or bilateral pneumonia. Possible small left pleural effusion. Electronically Signed   By: Elmer Picker M.D.   On: 07/14/2021 15:30   DG Chest Port 1 View  Result Date: 07/05/2021 CLINICAL DATA:  Shortness of breath. EXAM: PORTABLE CHEST 1 VIEW COMPARISON:  Radiograph 06/30/2021, additional prior exams reviewed. FINDINGS: Unchanged cardiomegaly. Small pleural effusions, left greater than right, slightly increased. Patchy airspace disease in the right mid lung is not significantly changed. Diffuse interstitial opacity. No pneumothorax. Stable osseous structures. IMPRESSION: 1. Unchanged cardiomegaly. Diffuse pulmonary interstitial opacity may represent pulmonary edema. 2. Small pleural effusions, left greater than right, slightly increased. 3. Unchanged patchy airspace disease in the right mid lung, indeterminate for pneumonia or scarring. Electronically Signed   By: Keith Rake M.D.   On: 07/05/2021 18:25   DG Chest Port 1 View  Result Date: 06/30/2021 CLINICAL DATA:  Dyspnea R06.00 (ICD-10-CM) EXAM: PORTABLE CHEST 1 VIEW COMPARISON:  June 26, 2021. FINDINGS: Slightly increased conspicuity of patchy airspace opacity in the right midlung. No visible pleural effusions or pneumothorax. Similar enlarged  cardiac silhouette. Degenerative changes of the spine. IMPRESSION: 1. Slightly increased conspicuity of patchy airspace opacity in the right midlung, suspicious for pneumonia. Followup PA and lateral chest X-ray is recommended in 3-4 weeks following trial of antibiotic therapy to ensure resolution and exclude underlying malignancy. 2. Similar cardiomegaly. Electronically Signed   By: Margaretha Sheffield M.D.   On: 06/30/2021 11:43   DG Chest Port 1 View  Result Date: 06/26/2021 CLINICAL DATA:  Hypoxia. EXAM: PORTABLE CHEST 1 VIEW COMPARISON:  Prior chest radiographs 03/06/2020 and earlier. FINDINGS: Cardiomegaly,  unchanged. Subtle ill-defined opacity within the right mid-lung. No appreciable airspace consolidation within the left lung. No evidence of pleural effusion or pneumothorax. No acute bony abnormality identified. Degenerative changes of the spine. IMPRESSION: Subtle ill-defined opacity within the right mid-lung, which may reflect scarring, atelectasis or early pneumonia. Clinical correlation is recommended. Additionally, short-interval radiographic follow-up is recommended. Cardiomegaly, unchanged. Electronically Signed   By: Kellie Simmering D.O.   On: 06/26/2021 11:09   DG Abd 2 Views  Result Date: 06/24/2021 CLINICAL DATA:  Abdominal distension EXAM: ABDOMEN - 2 VIEW COMPARISON:  06/21/2021 FINDINGS: Scattered large and small bowel gas is noted. No significant dilatation is noted. Postsurgical changes are seen consistent with recent exploratory laparotomy. These findings are likely related to a postoperative ileus. No definitive free air is seen. No acute bony abnormality is noted. Prostate therapy seeds are seen. IMPRESSION: Changes consistent with mild postoperative ileus. Electronically Signed   By: Inez Catalina M.D.   On: 06/24/2021 21:26   ECHOCARDIOGRAM COMPLETE  Result Date: 07/16/2021    ECHOCARDIOGRAM REPORT   Patient Name:   HARTWELL VANDIVER Date of Exam: 07/16/2021 Medical Rec #:   [id number]       Height:       68.5 in Accession #:    [id number]      Weight:       230.2 lb Date of Birth:  1925/11/21       BSA:          2.181 m Patient Age:    71 years        BP:           122/70 mmHg Patient Gender: M               HR:           85 bpm. Exam Location:  [facility] Procedure: 2D Echo, Cardiac Doppler and Color Doppler Indications:     Dyspnea R06.00  History:         Patient has prior history of Echocardiogram examinations, most                  recent 03/02/2020. COPD and Stroke; Risk Factors:Hypertension                  and Sleep Apnea. Pneumonia.  Sonographer:     Sherrie Sport Referring Phys:  [place] Diagnosing Phys: Kathlyn Sacramento MD  Sonographer Comments: Suboptimal apical window. IMPRESSIONS  1. Left ventricular ejection fraction, by estimation, is 60 to 65%. The left ventricle has normal function. The left ventricle has no regional wall motion abnormalities. There is mild left ventricular hypertrophy. Left ventricular diastolic parameters are indeterminate.  2. Right ventricular systolic function is normal. The right ventricular size is normal.  3. Left atrial size was mild to moderately dilated.  4. Right atrial size was mild to moderately dilated.  5. The mitral valve is normal in structure. No evidence of mitral valve regurgitation. No evidence of mitral stenosis.  6. The aortic valve is normal in structure. Aortic valve regurgitation is not visualized. Aortic valve sclerosis/calcification is present, without any evidence of aortic stenosis. FINDINGS  Left Ventricle: Left ventricular ejection fraction, by estimation, is 60 to 65%. The left ventricle has normal function. The left ventricle has no regional wall motion abnormalities. The left ventricular internal cavity size was normal in size. There is  mild left ventricular hypertrophy. Left ventricular diastolic parameters are indeterminate. Right Ventricle: The right ventricular size  is normal. No increase in right ventricular  wall thickness. Right ventricular systolic function is normal. Left Atrium: Left atrial size was mild to moderately dilated. Right Atrium: Right atrial size was mild to moderately dilated. Pericardium: There is no evidence of pericardial effusion. Mitral Valve: The mitral valve is normal in structure. No evidence of mitral valve regurgitation. No evidence of mitral valve stenosis. MV peak gradient, 6.4 mmHg. The mean mitral valve gradient is 3.0 mmHg. Tricuspid Valve: The tricuspid valve is normal in structure. Tricuspid valve regurgitation is trivial. No evidence of tricuspid stenosis. Aortic Valve: The aortic valve is normal in structure. Aortic valve regurgitation is not visualized. Aortic valve sclerosis/calcification is present, without any evidence of aortic stenosis. Aortic valve mean gradient measures 6.0 mmHg. Aortic valve peak  gradient measures 11.3 mmHg. Aortic valve area, by VTI measures 1.33 cm. Pulmonic Valve: The pulmonic valve was normal in structure. Pulmonic valve regurgitation is not visualized. No evidence of pulmonic stenosis. Aorta: The aortic root is normal in size and structure. Venous: The inferior vena cava was not well visualized. IAS/Shunts: No atrial level shunt detected by color flow Doppler.  LEFT VENTRICLE PLAX 2D LVIDd:         4.40 cm   Diastology LVIDs:         2.80 cm   LV e' medial:    7.62 cm/s LV PW:         1.30 cm   LV E/e' medial:  12.0 LV IVS:        1.05 cm   LV e' lateral:   7.07 cm/s LVOT diam:     2.00 cm   LV E/e' lateral: 12.9 LV SV:         41 LV SV Index:   19 LVOT Area:     3.14 cm  RIGHT VENTRICLE RV Basal diam:  4.30 cm RV S prime:     19.90 cm/s TAPSE (M-mode): 5.3 cm LEFT ATRIUM             Index        RIGHT ATRIUM           Index LA diam:        3.70 cm 1.70 cm/m   RA Area:     28.50 cm LA Vol (A2C):   98.9 ml 45.34 ml/m  RA Volume:   94.80 ml  43.46 ml/m LA Vol (A4C):   79.3 ml 36.35 ml/m LA Biplane Vol: 93.6 ml 42.91 ml/m  AORTIC VALVE                      PULMONIC VALVE AV Area (Vmax):    1.24 cm      PV Vmax:        1.02 m/s AV Area (Vmean):   1.26 cm      PV Vmean:       72.450 cm/s AV Area (VTI):     1.33 cm      PV VTI:         0.228 m AV Vmax:           168.33 cm/s   PV Peak grad:   4.2 mmHg AV Vmean:          107.567 cm/s  PV Mean grad:   2.0 mmHg AV VTI:            0.308 m       RVOT Peak grad: 6 mmHg AV Peak Grad:  11.3 mmHg AV Mean Grad:      6.0 mmHg LVOT Vmax:         66.60 cm/s LVOT Vmean:        43.100 cm/s LVOT VTI:          0.131 m LVOT/AV VTI ratio: 0.42  AORTA Ao Root diam: 3.17 cm MITRAL VALVE                TRICUSPID VALVE MV Area (PHT): 2.16 cm     TR Peak grad:   22.8 mmHg MV Area VTI:   0.86 cm     TR Vmax:        239.00 cm/s MV Peak grad:  6.4 mmHg MV Mean grad:  3.0 mmHg     SHUNTS MV Vmax:       1.26 m/s     Systemic VTI:  0.13 m MV Vmean:      75.4 cm/s    Systemic Diam: 2.00 cm MV Decel Time: 351 msec     Pulmonic VTI:  0.220 m MV E velocity: 91.30 cm/s MV A velocity: 107.00 cm/s MV E/A ratio:  0.85 Kathlyn Sacramento MD Electronically signed by Kathlyn Sacramento MD Signature Date/Time: 07/16/2021/4:10:42 PM    Final       Small bowel resection with anastomosis 06/22/2021    Subjective: Patient seen up in recliner today.  On 2 L/min nasal cannula oxygen which she reports is his baseline.  Reports he is very eager to get out of the hospital return to [facility].  Denies chest pain, shortness of breath, fevers chills, nausea vomiting diarrhea or other acute complaints.   Discharge Exam: Vitals:   07/18/21 1115 07/18/21 1311  BP: (!) 144/56   Pulse: 89   Resp:    Temp: 97.7 F (36.5 C)   SpO2: 94% 97%   Vitals:   07/18/21 0729 07/18/21 0840 07/18/21 1115 07/18/21 1311  BP:  (!) 173/58 (!) 144/56   Pulse:  88 89   Resp:  17    Temp:  98 F (36.7 C) 97.7 F (36.5 C)   TempSrc:   Oral   SpO2: 98% 96% 94% 97%  Weight:      Height:        General: Pt is alert, awake, not in acute distress,  obese Cardiovascular: RRR, S1/S2 +, no rubs, no gallops Respiratory: CTA bilaterally with diminished bases, no wheezing, no rhonchi Abdominal: Soft, NT, ND, bowel sounds + Extremities: Normal tone, moves all extremities, no cyanosis    The results of significant diagnostics from this hospitalization (including imaging, microbiology, ancillary and laboratory) are listed below for reference.     Microbiology: No results found for this or any previous visit (from the past 240 hour(s)).   Labs: BNP (last 3 results) Recent Labs    07/05/21 1753  BNP 885.0*   Basic Metabolic Panel: Recent Labs  Lab 07/14/21 0539 07/15/21 0538 07/16/21 0558 07/17/21 0448 07/18/21 0534  NA 140 138 136 134* 135  K 3.4* 3.8 4.2 4.5 3.9  CL 95* 95* 92* 93* 97*  CO2 36* 33* 32 28 31  GLUCOSE 133* 205* 256* 267* 138*  BUN 85* 94* 102* 109* 100*  CREATININE 1.88* 2.24* 2.47* 2.35* 2.09*  CALCIUM 8.5* 8.2* 8.3* 8.7* 9.1   Liver Function Tests: No results for input(s): AST, ALT, ALKPHOS, BILITOT, PROT, ALBUMIN in the last 168 hours. No results for input(s): LIPASE, AMYLASE in the last 168 hours. No results for input(s):  AMMONIA in the last 168 hours. CBC: Recent Labs  Lab 07/13/21 0447 07/15/21 0538  WBC 17.5* 8.5  HGB 9.6* 9.4*  HCT 28.8* 28.2*  MCV 93.5 93.7  PLT 213 179   Cardiac Enzymes: No results for input(s): CKTOTAL, CKMB, CKMBINDEX, TROPONINI in the last 168 hours. BNP: Invalid input(s): POCBNP CBG: No results for input(s): GLUCAP in the last 168 hours. D-Dimer No results for input(s): DDIMER in the last 72 hours. Hgb A1c No results for input(s): HGBA1C in the last 72 hours. Lipid Profile No results for input(s): CHOL, HDL, LDLCALC, TRIG, CHOLHDL, LDLDIRECT in the last 72 hours. Thyroid function studies No results for input(s): TSH, T4TOTAL, T3FREE, THYROIDAB in the last 72 hours.  Invalid input(s): FREET3 Anemia work up No results for input(s): VITAMINB12, FOLATE,  FERRITIN, TIBC, IRON, RETICCTPCT in the last 72 hours. Urinalysis    Component Value Date/Time   COLORURINE AMBER (A) 06/25/2021 1545   APPEARANCEUR HAZY (A) 06/25/2021 1545   APPEARANCEUR Clear 03/19/2014 1049   LABSPEC 1.018 06/25/2021 1545   LABSPEC 1.016 03/19/2014 1049   PHURINE 5.0 06/25/2021 1545   GLUCOSEU NEGATIVE 06/25/2021 1545   GLUCOSEU Negative 03/19/2014 1049   HGBUR NEGATIVE 06/25/2021 1545   BILIRUBINUR NEGATIVE 06/25/2021 1545   BILIRUBINUR Negative 03/19/2014 1049   KETONESUR 5 (A) 06/25/2021 1545   PROTEINUR 30 (A) 06/25/2021 1545   NITRITE NEGATIVE 06/25/2021 1545   LEUKOCYTESUR TRACE (A) 06/25/2021 1545   LEUKOCYTESUR Negative 03/19/2014 1049   Sepsis Labs Invalid input(s): PROCALCITONIN,  WBC,  LACTICIDVEN Microbiology No results found for this or any previous visit (from the past 240 hour(s)).   Time coordinating discharge: Over 30 minutes  SIGNED:   Ezekiel Slocumb, DO [facility] 07/18/2021, 3:07 PM   If 7PM-7AM, please contact night-coverage [email or web address]

## 2021-07-18 NOTE — TOC Transition Note (Signed)
Transition of Care [facility]) - CM/SW Discharge Note   Patient Details  Name: Trevor Ferguson MRN: [id number] Date of Birth: Jan 10, 1926  [facility] of [facility] CM/SW Contact:  Alberteen Sam, LCSW Phone Number: 07/18/2021, 3:30 PM   Clinical Narrative:     Patient will DC to: [facility] Anticipated DC date: 07/18/21 Family notified: daughter [name] Transport byJohnanna Schneiders  Per MD patient ready for DC to [facility]. RN, patient, patient's family, and facility notified of DC. Discharge Summary sent to facility. RN given number for report  [phone] Room 502   . DC packet on chart. Ambulance transport requested for patient.  CSW signing off.  Pricilla Riffle, LCSW    Final next level of care: Skilled Nursing Facility Barriers to Discharge: No Barriers Identified   Patient Goals and CMS Choice Patient states their goals for this hospitalization and ongoing recovery are:: return to [facility] CMS Medicare.gov Compare Post Acute Care list provided to:: Patient Represenative (must comment) (daughter [name]) Choice offered to / list presented to : Patient  Discharge Placement              Patient chooses bed at: [facility] Patient to be transferred to facility by: [facility] Name of family member notified: daughter [name] Patient and family notified of of transfer: 07/18/21  Discharge Plan and Services                                     Social Determinants of Health (SDOH) Interventions     Readmission Risk Interventions Readmission Risk Prevention Plan 06/23/2021 01/29/2021  Transportation Screening Complete Complete  PCP or Specialist Appt within 3-5 Days Complete Complete  HRI or Home Care Consult Complete Complete  Social Work Consult for [place] Planning/Counseling Complete Complete  Palliative Care Screening Not Applicable Not Applicable  Medication Review Press photographer) Complete Complete  Some recent data might be hidden

## 2021-07-18 NOTE — Progress Notes (Signed)
Nutrition Follow-up  DOCUMENTATION CODES:   Obesity unspecified  INTERVENTION:   -Decrease Ensure Enlive po to BID, each supplement provides 350 kcal and 20 grams of protein  -Continue MVI with minerals daily   NUTRITION DIAGNOSIS:   Inadequate oral intake related to acute illness as evidenced by other (comment) (pt on clear liquid diet).  Progressing; advanced to soft diet on 06/30/21  GOAL:   Patient will meet greater than or equal to 90% of their needs  Progressing   MONITOR:   PO intake, Supplement acceptance, Labs, Weight trends, Skin, I & O's, Diet advancement  REASON FOR ASSESSMENT:   NPO/Clear Liquid Diet    ASSESSMENT:   87 y/o male with h/o COPD, hypertension, prostate cancer, stage IIIb chronic kidney disease, CVA and sleep apnea who is admitted with peforated diverticulits now s/p ex lap with small bowel resection with removal of 33cm of jejunum on 07/6 complicated by post op ileus.  11/4- s/p Exploratory laparotomy with small bowel resection 11/5- pt pulled out NGT, advanced to clear liquid diet 11/9- advanced to full liquid diet 11/12- s/p BSE- advanced to a mechanical soft diet  Reviewed I/O's: -1.6 L x 24 hours and -12.6 L 07/04/21  UOP: 2.1 L x 24 hours   Pt remains with good appetite. Noted meal completions 50-100%.   Pt awaiting medical stability (respiratory and kidney function) to discharge to SNF.   Medications reviewed and include prednisone and demadex.   Labs reviewed.   Diet Order:   Diet Order             Diet - low sodium heart healthy           DIET SOFT Room service appropriate? Yes with Assist; Fluid consistency: Thin  Diet effective now                   EDUCATION NEEDS:   Not appropriate for education at this time  Skin:  Skin Assessment: Skin Integrity Issues: Skin Integrity Issues:: Stage II Stage II: buttocks Incisions: closed abdomen  Last BM:  07/17/21  Height:   Ht Readings from Last 1 Encounters:   06/22/21 5' 8.5" (1.74 m)    Weight:   Wt Readings from Last 1 Encounters:  06/27/21 104.4 kg    Ideal Body Weight:  71.36 kg  BMI:  Body mass index is 34.49 kg/m.  Estimated Nutritional Needs:   Kcal:  1900-2200kcal/day  Protein:  95-110g/day  Fluid:  1.8-2.1L/day    Loistine Chance, RD, LDN, [place] Registered Dietitian II Certified Diabetes Care and Education Specialist Please refer to AMION for RD and/or RD on-call/weekend/after hours pager

## 2021-07-18 NOTE — Progress Notes (Addendum)
Physical Therapy Treatment Patient Details Name: Trevor Ferguson MRN: [id number] DOB: Nov 13, 1925 Today's Date: 07/18/2021   History of Present Illness Patient is a 87 year old male with PMH of COPD, prostate cancer, HTN, sleep apnea, stage IIIB CKD and CVA. He presented to ER with acute onset of R lower quadrant abdominal pain with nausea and dry heaves.  Patient underwent exploratory laparotomy with small bowl resection on 06/22/21. Patient fell on 06/23/21 while in hospital. Pt also found to have acute hypoxic respiratory failure 2/2 atelectasis versus early pneumonia. Patient came from [facility] SNF    PT Comments    Patient was asleep in hospital bed upon arrival of physical therapist.  Patient alert and oriented and was agreeable to complete some physical therapy activity at this time.  Patient is optimistic to go to go to skilled nursing facility at [facility] sometime in the near future.  Patient was able to complete several lower extremity exercises as well as standing exercises including marching without significant difficulty but did show signs of fatigue following standing for approximately 4 to 5 minutes.  Although patient so signs of fatigue oxygen saturation did not drop below 90 with standing activities today.  However, oxygen saturation was 87 following several lower extremity exercises in supine in the bed, oxygen recovered to above 90 within 1 minute.  Patient was able to transfer with contact-guard assist from hospital bed to hospital chair and was left seated in chair with breakfast call bell and phone all within reach and all needs met.  Patient will continue to benefit from skilled physical therapy in hospital setting or in skilled nursing facility following hospital discharge.   Recommendations for follow up therapy are one component of a multi-disciplinary discharge planning process, led by the attending physician.  Recommendations may be updated based on patient status,  additional functional criteria and insurance authorization.  Follow Up Recommendations  Skilled nursing-short term rehab (<3 hours/day)     Assistance Recommended at Discharge Frequent or constant Supervision/Assistance  Equipment Recommendations  None recommended by PT    Recommendations for Other Services       Precautions / Restrictions Precautions Precautions: Fall Precaution Comments: check O2 Restrictions Other Position/Activity Restrictions: Frequent repositioning needed; buttocks is raw/bleeding in spots R/L buttock from frequent BMs (SN aware)     Mobility  Bed Mobility                    Transfers                        Ambulation/Gait                   Stairs             Wheelchair Mobility    Modified Rankin (Stroke Patients Only)       Balance Overall balance assessment: Needs assistance Sitting-balance support: Feet supported Sitting balance-Leahy Scale: Normal Sitting balance - Comments: Good sitting balance; able to tilt laterally to the L and reach with the R to perform toilet hygiene, supported by feet   Standing balance support: Bilateral upper extremity supported;During functional activity;Reliant on assistive device for balance Standing balance-Leahy Scale: Fair Standing balance comment: no LOb noted with marching or ambulation this AM, ambulated limited distance in the room.  Cognition Arousal/Alertness: Awake/alert Behavior During Therapy: WFL for tasks assessed/performed Overall Cognitive Status: Within Functional Limits for tasks assessed                                 General Comments: Pt alert and oriented to self, place, and situation.  Pt verbalizing fatigue.        Exercises General Exercises - Lower Extremity Ankle Circles/Pumps: AROM;Both;20 reps Long Arc Quad: 20 reps;Both;Strengthening;AROM Straight Leg Raises: Strengthening;Both;10  reps Hip Flexion/Marching: 20 reps;AROM;Strengthening;Standing Other Exercises Other Exercises: ambulated 10 feet in romm with RW and CGA. Pt was experiencing fatigue and requested to sit down following several standing exercises. Other Exercises: 02 sat to 87 with supine exercises, with standing exercise dropped to 90. Pt also instructed in nose breathing in order to allow more flow from oxygen and he reported this helped with his breathing.    General Comments        Pertinent Vitals/Pain      Home Living Family/patient expects to be discharged to:: Skilled nursing facility                   Additional Comments: Patient resides at [facility] and uses 2L of supplemental O2 at baseline    Prior Function            PT Goals (current goals can now be found in the care plan section)      Frequency    Min 2X/week      PT Plan      Co-evaluation              AM-PAC PT "6 Clicks" Mobility   Outcome Measure  Help needed turning from your back to your side while in a flat bed without using bedrails?: A Little Help needed moving from lying on your back to sitting on the side of a flat bed without using bedrails?: A Little Help needed moving to and from a bed to a chair (including a wheelchair)?: A Little Help needed standing up from a chair using your arms (e.g., wheelchair or bedside chair)?: None Help needed to walk in hospital room?: A Little Help needed climbing 3-5 steps with a railing? : A Lot 6 Click Score: 18    End of Session Equipment Utilized During Treatment: Gait belt;Oxygen Activity Tolerance: Patient limited by fatigue Patient left: in chair;with chair alarm set;with call bell/phone within reach Nurse Communication: Mobility status PT Visit Diagnosis: Unsteadiness on feet (R26.81);Other abnormalities of gait and mobility (R26.89);Repeated falls (R29.6);Muscle weakness (generalized) (M62.81);Difficulty in walking, not elsewhere classified  (R26.2);History of falling (Z91.81)     Time: 2409-7353 PT Time Calculation (min) (ACUTE ONLY): 24 min  Charges:  $Therapeutic Exercise: 23-37 mins                     Rivka Barbara PT, DPT     Particia Lather 07/18/2021, 10:37 AM

## 2021-07-18 NOTE — Plan of Care (Signed)

## 2021-07-18 NOTE — Progress Notes (Signed)
[facility]  PROGRESS NOTE   Subjective:   Patient seen sitting up in chair States her breathing has improved, currently on 2 L Recorded urine output of 2 L in past 24 hours. Edema slightly improved  Creatinine improved to 2.1.   Objective:  Vital signs in last 24 hours:  Temp:  [97.3 F (36.3 C)-98.3 F (36.8 C)] 97.7 F (36.5 C) (11/30 1115) Pulse Rate:  [61-89] 89 (11/30 1115) Resp:  [17-20] 17 (11/30 0840) BP: (143-173)/(56-84) 144/56 (11/30 1115) SpO2:  [93 %-98 %] 94 % (11/30 1115) FiO2 (%):  [93 %] 93 % (11/30 1030)  Weight change:  Filed Weights   06/22/21 2134 06/26/21 1549 06/27/21 0458  Weight: 96.2 kg 103.6 kg 104.4 kg    Intake/Output: I/O last 3 completed shifts: In: 480 [P.O.:480] Out: 3800 [Urine:3800]   Intake/Output this shift:  Total I/O In: 420 [P.O.:420] Out: 400 [Urine:400]  Physical Exam: General:  No acute distress, sitting in chair  Head:  Normocephalic, atraumatic. Moist oral mucosal membranes  Eyes:  Anicteric  Lungs:  Minimal wheeze at bases, O2 2L  Heart:  regular  Abdomen:  Midline surgical wound with gauze dressing  Extremities:  1+ peripheral edema.  Neurologic:  Awake, alert, following commands  Skin:  No lesions       Basic Metabolic Panel: Recent Labs  Lab 07/14/21 0539 07/15/21 0538 07/16/21 0558 07/17/21 0448 07/18/21 0534  NA 140 138 136 134* 135  K 3.4* 3.8 4.2 4.5 3.9  CL 95* 95* 92* 93* 97*  CO2 36* 33* 32 28 31  GLUCOSE 133* 205* 256* 267* 138*  BUN 85* 94* 102* 109* 100*  CREATININE 1.88* 2.24* 2.47* 2.35* 2.09*  CALCIUM 8.5* 8.2* 8.3* 8.7* 9.1     CBC: Recent Labs  Lab 07/13/21 0447 07/15/21 0538  WBC 17.5* 8.5  HGB 9.6* 9.4*  HCT 28.8* 28.2*  MCV 93.5 93.7  PLT 213 179      Urinalysis: No results for input(s): COLORURINE, LABSPEC, PHURINE, GLUCOSEU, HGBUR, BILIRUBINUR, KETONESUR, PROTEINUR, UROBILINOGEN, NITRITE, LEUKOCYTESUR in the last 72 hours.  Invalid input(s):  APPERANCEUR    Imaging: ECHOCARDIOGRAM COMPLETE  Result Date: 07/16/2021    ECHOCARDIOGRAM REPORT   Patient Name:   Trevor Ferguson Date of Exam: 07/16/2021 Medical Rec #:  [id number]       Height:       68.5 in Accession #:    [id number]      Weight:       230.2 lb Date of Birth:  10/26/1925       BSA:          2.181 m Patient Age:    87 years        BP:           122/70 mmHg Patient Gender: M               HR:           85 bpm. Exam Location:  [facility] Procedure: 2D Echo, Cardiac Doppler and Color Doppler Indications:     Dyspnea R06.00  History:         Patient has prior history of Echocardiogram examinations, most                  recent 03/02/2020. COPD and Stroke; Risk Factors:Hypertension                  and Sleep Apnea. Pneumonia.  Sonographer:  Sherrie Sport Referring Phys:  [place] Diagnosing Phys: Kathlyn Sacramento MD  Sonographer Comments: Suboptimal apical window. IMPRESSIONS  1. Left ventricular ejection fraction, by estimation, is 60 to 65%. The left ventricle has normal function. The left ventricle has no regional wall motion abnormalities. There is mild left ventricular hypertrophy. Left ventricular diastolic parameters are indeterminate.  2. Right ventricular systolic function is normal. The right ventricular size is normal.  3. Left atrial size was mild to moderately dilated.  4. Right atrial size was mild to moderately dilated.  5. The mitral valve is normal in structure. No evidence of mitral valve regurgitation. No evidence of mitral stenosis.  6. The aortic valve is normal in structure. Aortic valve regurgitation is not visualized. Aortic valve sclerosis/calcification is present, without any evidence of aortic stenosis. FINDINGS  Left Ventricle: Left ventricular ejection fraction, by estimation, is 60 to 65%. The left ventricle has normal function. The left ventricle has no regional wall motion abnormalities. The left ventricular internal cavity size was normal in size. There is   mild left ventricular hypertrophy. Left ventricular diastolic parameters are indeterminate. Right Ventricle: The right ventricular size is normal. No increase in right ventricular wall thickness. Right ventricular systolic function is normal. Left Atrium: Left atrial size was mild to moderately dilated. Right Atrium: Right atrial size was mild to moderately dilated. Pericardium: There is no evidence of pericardial effusion. Mitral Valve: The mitral valve is normal in structure. No evidence of mitral valve regurgitation. No evidence of mitral valve stenosis. MV peak gradient, 6.4 mmHg. The mean mitral valve gradient is 3.0 mmHg. Tricuspid Valve: The tricuspid valve is normal in structure. Tricuspid valve regurgitation is trivial. No evidence of tricuspid stenosis. Aortic Valve: The aortic valve is normal in structure. Aortic valve regurgitation is not visualized. Aortic valve sclerosis/calcification is present, without any evidence of aortic stenosis. Aortic valve mean gradient measures 6.0 mmHg. Aortic valve peak  gradient measures 11.3 mmHg. Aortic valve area, by VTI measures 1.33 cm. Pulmonic Valve: The pulmonic valve was normal in structure. Pulmonic valve regurgitation is not visualized. No evidence of pulmonic stenosis. Aorta: The aortic root is normal in size and structure. Venous: The inferior vena cava was not well visualized. IAS/Shunts: No atrial level shunt detected by color flow Doppler.  LEFT VENTRICLE PLAX 2D LVIDd:         4.40 cm   Diastology LVIDs:         2.80 cm   LV e' medial:    7.62 cm/s LV PW:         1.30 cm   LV E/e' medial:  12.0 LV IVS:        1.05 cm   LV e' lateral:   7.07 cm/s LVOT diam:     2.00 cm   LV E/e' lateral: 12.9 LV SV:         41 LV SV Index:   19 LVOT Area:     3.14 cm  RIGHT VENTRICLE RV Basal diam:  4.30 cm RV S prime:     19.90 cm/s TAPSE (M-mode): 5.3 cm LEFT ATRIUM             Index        RIGHT ATRIUM           Index LA diam:        3.70 cm 1.70 cm/m   RA Area:      28.50 cm LA Vol (A2C):   98.9 ml 45.34 ml/m  RA Volume:   94.80 ml  43.46 ml/m LA Vol (A4C):   79.3 ml 36.35 ml/m LA Biplane Vol: 93.6 ml 42.91 ml/m  AORTIC VALVE                     PULMONIC VALVE AV Area (Vmax):    1.24 cm      PV Vmax:        1.02 m/s AV Area (Vmean):   1.26 cm      PV Vmean:       72.450 cm/s AV Area (VTI):     1.33 cm      PV VTI:         0.228 m AV Vmax:           168.33 cm/s   PV Peak grad:   4.2 mmHg AV Vmean:          107.567 cm/s  PV Mean grad:   2.0 mmHg AV VTI:            0.308 m       RVOT Peak grad: 6 mmHg AV Peak Grad:      11.3 mmHg AV Mean Grad:      6.0 mmHg LVOT Vmax:         66.60 cm/s LVOT Vmean:        43.100 cm/s LVOT VTI:          0.131 m LVOT/AV VTI ratio: 0.42  AORTA Ao Root diam: 3.17 cm MITRAL VALVE                TRICUSPID VALVE MV Area (PHT): 2.16 cm     TR Peak grad:   22.8 mmHg MV Area VTI:   0.86 cm     TR Vmax:        239.00 cm/s MV Peak grad:  6.4 mmHg MV Mean grad:  3.0 mmHg     SHUNTS MV Vmax:       1.26 m/s     Systemic VTI:  0.13 m MV Vmean:      75.4 cm/s    Systemic Diam: 2.00 cm MV Decel Time: 351 msec     Pulmonic VTI:  0.220 m MV E velocity: 91.30 cm/s MV A velocity: 107.00 cm/s MV E/A ratio:  0.85 Kathlyn Sacramento MD Electronically signed by Kathlyn Sacramento MD Signature Date/Time: 07/16/2021/4:10:42 PM    Final      Medications:    sodium chloride 10 mL/hr at 06/26/21 1905    acidophilus  2 capsule Oral TID   albuterol  2.5 mg Nebulization TID   cholecalciferol  1,000 Units Oral Daily   escitalopram  10 mg Oral Daily   famotidine  10 mg Oral BID   feeding supplement  237 mL Oral TID BM   ferrous sulfate  325 mg Oral Q breakfast   fluticasone  2 spray Each Nare Daily   guaiFENesin  1,200 mg Oral BID   heparin injection (subcutaneous)  5,000 Units Subcutaneous Q8H   mouth rinse  15 mL Mouth Rinse BID   multivitamin with minerals  1 tablet Oral Daily   predniSONE  20 mg Oral Q breakfast   SM Double Antibiotic  1 application Topical  BID   torsemide  60 mg Oral Daily   traZODone  150 mg Oral QHS   umeclidinium-vilanterol  2 puff Inhalation Daily    Assessment/ Plan:     Active Problems:   COPD with acute exacerbation ([place])  Acute diverticulitis   AKI (acute kidney injury) ([place])   Diverticulitis   Hypoxia   Perforation of intestine due to diverticulitis of gastrointestinal tract   HAP (hospital-acquired pneumonia)   Abdominal distension (gaseous)   Pressure injury of skin   Dyspnea   Acute on chronic respiratory failure with hypoxia ([facility])   Acute diastolic congestive heart failure ([facility])   Pulmonary edema  Trevor Ferguson is a 87 y.o. white male with hypertension, coronary artery disease, hyperlipidemia, COPD, obstructive sleep apnea, who is admitted to [facility] on 06/21/2021 for Diverticulitis [K57.92] AKI (acute kidney injury) ([place]) [N17.9] Acute diverticulitis [K57.92] Perforation of intestine due to diverticulitis of gastrointestinal tract [K57.80]  Patient underwent Exlap with bowel resection on 06/22/2021.   Acute kidney injury on chronic kidney disease stage IIIB with proteinuria: baseline creatinine of 1.5, GFR of 43 on 07/05/21. Acute kidney injury secondary to prerenal azotemia and ATN. Nonoliguric urine output.  No indication for dialysis.   -Creatinine slightly improved today.  We will restart torsemide 60 mg daily and hold metolazone.   -Will schedule follow-up with nephrology 1 to 2 weeks after discharge  Anemia of chronic kidney disease: hemoglobin 9.4, normocytic. No indication for ESA. History of iron deficiency.   - Continue ferrous sulfate.  -Patient would be a candidate for ESA, will consider outpatient.  Hypertension with chronic kidney disease:  - Continue losartan and amlodipine -BP stable   4.  Lower extremity edema.    Slightly improved overnight.  We will hold metolazone and restart torsemide as above  5.  Metabolic alkalosis likely due to IV diuresis VBG shows pH of 7.52 with  PCO2 at 46 Previously given Acetazolamide      LOS: [facility] 11/30/202212:47 PM

## 2021-07-23 LAB — BLOOD GAS, VENOUS
Acid-Base Excess: 13.2 mmol/L — ABNORMAL HIGH (ref 0.0–2.0)
Bicarbonate: 37.6 mmol/L — ABNORMAL HIGH (ref 20.0–28.0)
O2 Saturation: 95.1 %
Patient temperature: 37
pCO2, Ven: 46 mmHg (ref 44.0–60.0)
pH, Ven: 7.52 — ABNORMAL HIGH (ref 7.250–7.430)
pO2, Ven: 68 mmHg — ABNORMAL HIGH (ref 32.0–45.0)

## 2021-07-25 DIAGNOSIS — J441 Chronic obstructive pulmonary disease with (acute) exacerbation: Secondary | ICD-10-CM | POA: Diagnosis not present

## 2021-07-25 DIAGNOSIS — N184 Chronic kidney disease, stage 4 (severe): Secondary | ICD-10-CM

## 2021-07-25 DIAGNOSIS — K5700 Diverticulitis of small intestine with perforation and abscess without bleeding: Secondary | ICD-10-CM | POA: Diagnosis not present

## 2021-07-25 DIAGNOSIS — I10 Essential (primary) hypertension: Secondary | ICD-10-CM

## 2021-08-08 DIAGNOSIS — R2241 Localized swelling, mass and lump, right lower limb: Secondary | ICD-10-CM

## 2021-08-08 DIAGNOSIS — R531 Weakness: Secondary | ICD-10-CM | POA: Diagnosis not present

## 2021-08-10 ENCOUNTER — Emergency Department: Payer: Medicare PPO

## 2021-08-10 ENCOUNTER — Inpatient Hospital Stay
Admission: EM | Admit: 2021-08-10 | Discharge: 2021-08-12 | DRG: 811 | Disposition: A | Discharge status: Skilled Nursing Facility | Payer: Medicare PPO | Source: Skilled Nursing Facility | Attending: Internal Medicine | Admitting: Internal Medicine

## 2021-08-10 ENCOUNTER — Encounter: Payer: Self-pay | Admitting: Emergency Medicine

## 2021-08-10 ENCOUNTER — Other Ambulatory Visit: Payer: Self-pay

## 2021-08-10 DIAGNOSIS — Z9981 Dependence on supplemental oxygen: Secondary | ICD-10-CM | POA: Diagnosis not present

## 2021-08-10 DIAGNOSIS — D500 Iron deficiency anemia secondary to blood loss (chronic): Secondary | ICD-10-CM | POA: Diagnosis present

## 2021-08-10 DIAGNOSIS — Z66 Do not resuscitate: Secondary | ICD-10-CM | POA: Diagnosis present

## 2021-08-10 DIAGNOSIS — Z8546 Personal history of malignant neoplasm of prostate: Secondary | ICD-10-CM

## 2021-08-10 DIAGNOSIS — K5721 Diverticulitis of large intestine with perforation and abscess with bleeding: Secondary | ICD-10-CM | POA: Diagnosis not present

## 2021-08-10 DIAGNOSIS — Z8673 Personal history of transient ischemic attack (TIA), and cerebral infarction without residual deficits: Secondary | ICD-10-CM

## 2021-08-10 DIAGNOSIS — Z8249 Family history of ischemic heart disease and other diseases of the circulatory system: Secondary | ICD-10-CM

## 2021-08-10 DIAGNOSIS — Z79899 Other long term (current) drug therapy: Secondary | ICD-10-CM

## 2021-08-10 DIAGNOSIS — R0902 Hypoxemia: Secondary | ICD-10-CM

## 2021-08-10 DIAGNOSIS — J180 Bronchopneumonia, unspecified organism: Secondary | ICD-10-CM | POA: Diagnosis present

## 2021-08-10 DIAGNOSIS — D649 Anemia, unspecified: Secondary | ICD-10-CM | POA: Diagnosis not present

## 2021-08-10 DIAGNOSIS — I5032 Chronic diastolic (congestive) heart failure: Secondary | ICD-10-CM | POA: Diagnosis present

## 2021-08-10 DIAGNOSIS — N184 Chronic kidney disease, stage 4 (severe): Secondary | ICD-10-CM

## 2021-08-10 DIAGNOSIS — Z20822 Contact with and (suspected) exposure to covid-19: Secondary | ICD-10-CM | POA: Diagnosis present

## 2021-08-10 DIAGNOSIS — E8809 Other disorders of plasma-protein metabolism, not elsewhere classified: Secondary | ICD-10-CM | POA: Diagnosis present

## 2021-08-10 DIAGNOSIS — J440 Chronic obstructive pulmonary disease with acute lower respiratory infection: Secondary | ICD-10-CM | POA: Diagnosis present

## 2021-08-10 DIAGNOSIS — K922 Gastrointestinal hemorrhage, unspecified: Secondary | ICD-10-CM | POA: Diagnosis not present

## 2021-08-10 DIAGNOSIS — E46 Unspecified protein-calorie malnutrition: Secondary | ICD-10-CM | POA: Diagnosis present

## 2021-08-10 DIAGNOSIS — Z7951 Long term (current) use of inhaled steroids: Secondary | ICD-10-CM

## 2021-08-10 DIAGNOSIS — Z87891 Personal history of nicotine dependence: Secondary | ICD-10-CM | POA: Diagnosis not present

## 2021-08-10 DIAGNOSIS — J441 Chronic obstructive pulmonary disease with (acute) exacerbation: Secondary | ICD-10-CM

## 2021-08-10 DIAGNOSIS — E785 Hyperlipidemia, unspecified: Secondary | ICD-10-CM | POA: Diagnosis present

## 2021-08-10 DIAGNOSIS — I130 Hypertensive heart and chronic kidney disease with heart failure and stage 1 through stage 4 chronic kidney disease, or unspecified chronic kidney disease: Secondary | ICD-10-CM | POA: Diagnosis present

## 2021-08-10 DIAGNOSIS — K921 Melena: Secondary | ICD-10-CM | POA: Diagnosis present

## 2021-08-10 DIAGNOSIS — K219 Gastro-esophageal reflux disease without esophagitis: Secondary | ICD-10-CM | POA: Diagnosis present

## 2021-08-10 DIAGNOSIS — D509 Iron deficiency anemia, unspecified: Secondary | ICD-10-CM

## 2021-08-10 DIAGNOSIS — D62 Acute posthemorrhagic anemia: Secondary | ICD-10-CM | POA: Diagnosis not present

## 2021-08-10 DIAGNOSIS — Z823 Family history of stroke: Secondary | ICD-10-CM | POA: Diagnosis not present

## 2021-08-10 LAB — PROTIME-INR
INR: 1.1 (ref 0.8–1.2)
Prothrombin Time: 14.6 seconds (ref 11.4–15.2)

## 2021-08-10 LAB — CBC WITH DIFFERENTIAL/PLATELET
Abs Immature Granulocytes: 0 10*3/uL (ref 0.00–0.07)
Band Neutrophils: 2 %
Basophils Absolute: 0 10*3/uL (ref 0.0–0.1)
Basophils Relative: 0 %
Eosinophils Absolute: 0.1 10*3/uL (ref 0.0–0.5)
Eosinophils Relative: 1 %
HCT: 17.2 % — ABNORMAL LOW (ref 39.0–52.0)
Hemoglobin: 5.8 g/dL — ABNORMAL LOW (ref 13.0–17.0)
Lymphocytes Relative: 9 %
Lymphs Abs: 0.6 10*3/uL — ABNORMAL LOW (ref 0.7–4.0)
MCH: 31.2 pg (ref 26.0–34.0)
MCHC: 33.7 g/dL (ref 30.0–36.0)
MCV: 92.5 fL (ref 80.0–100.0)
Monocytes Absolute: 0.4 10*3/uL (ref 0.1–1.0)
Monocytes Relative: 6 %
Neutro Abs: 6 10*3/uL (ref 1.7–7.7)
Neutrophils Relative %: 82 %
Platelets: 166 10*3/uL (ref 150–400)
RBC: 1.86 MIL/uL — ABNORMAL LOW (ref 4.22–5.81)
RDW: 16.7 % — ABNORMAL HIGH (ref 11.5–15.5)
Smear Review: NORMAL
WBC: 7.1 10*3/uL (ref 4.0–10.5)
nRBC: 0 % (ref 0.0–0.2)

## 2021-08-10 LAB — COMPREHENSIVE METABOLIC PANEL
ALT: 19 U/L (ref 0–44)
AST: 19 U/L (ref 15–41)
Albumin: 3.4 g/dL — ABNORMAL LOW (ref 3.5–5.0)
Alkaline Phosphatase: 81 U/L (ref 38–126)
Anion gap: 9 (ref 5–15)
BUN: 38 mg/dL — ABNORMAL HIGH (ref 8–23)
CO2: 28 mmol/L (ref 22–32)
Calcium: 8.6 mg/dL — ABNORMAL LOW (ref 8.9–10.3)
Chloride: 99 mmol/L (ref 98–111)
Creatinine, Ser: 2.46 mg/dL — ABNORMAL HIGH (ref 0.61–1.24)
GFR, Estimated: 24 mL/min — ABNORMAL LOW (ref >60)
Glucose, Bld: 109 mg/dL — ABNORMAL HIGH (ref 70–99)
Potassium: 4.2 mmol/L (ref 3.5–5.1)
Sodium: 136 mmol/L (ref 135–145)
Total Bilirubin: 0.7 mg/dL (ref 0.3–1.2)
Total Protein: 6.5 g/dL (ref 6.5–8.1)

## 2021-08-10 LAB — RESP PANEL BY RT-PCR (FLU A&B, COVID) ARPGX2
Influenza A by PCR: NEGATIVE
Influenza B by PCR: NEGATIVE
SARS Coronavirus 2 by RT PCR: NEGATIVE

## 2021-08-10 LAB — LIPASE, BLOOD: Lipase: 57 U/L — ABNORMAL HIGH (ref 11–51)

## 2021-08-10 LAB — PROCALCITONIN: Procalcitonin: 0.24 ng/mL

## 2021-08-10 LAB — PREPARE RBC (CROSSMATCH)

## 2021-08-10 MED ORDER — AMLODIPINE BESYLATE 5 MG PO TABS
5.0000 mg | ORAL_TABLET | Freq: Every day | ORAL | Status: DC
  Administered 2021-08-11 – 2021-08-12 (×2): 5 mg via ORAL
  Filled 2021-08-10 (×2): qty 1

## 2021-08-10 MED ORDER — SODIUM CHLORIDE 0.9 % IV SOLN: 10.0000 mL/h | Freq: Once | INTRAVENOUS | Status: DC

## 2021-08-10 MED ORDER — BOOST PLUS PO LIQD
237.0000 mL | Freq: Three times a day (TID) | ORAL | Status: DC
  Administered 2021-08-11: 09:00:00 237 mL via ORAL
  Filled 2021-08-10: qty 237

## 2021-08-10 MED ORDER — ACETAMINOPHEN 500 MG PO TABS
1000.0000 mg | ORAL_TABLET | Freq: Once | ORAL | Status: AC
  Administered 2021-08-10: 16:00:00 1000 mg via ORAL
  Filled 2021-08-10: qty 2

## 2021-08-10 MED ORDER — IPRATROPIUM-ALBUTEROL 0.5-2.5 (3) MG/3ML IN SOLN
3.0000 mL | RESPIRATORY_TRACT | Status: DC | PRN
  Filled 2021-08-10: qty 3

## 2021-08-10 MED ORDER — IPRATROPIUM-ALBUTEROL 0.5-2.5 (3) MG/3ML IN SOLN
3.0000 mL | Freq: Four times a day (QID) | RESPIRATORY_TRACT | Status: DC
  Administered 2021-08-10 – 2021-08-12 (×7): 3 mL via RESPIRATORY_TRACT
  Filled 2021-08-10 (×7): qty 3

## 2021-08-10 MED ORDER — IPRATROPIUM-ALBUTEROL 0.5-2.5 (3) MG/3ML IN SOLN
3.0000 mL | Freq: Once | RESPIRATORY_TRACT | Status: AC
  Administered 2021-08-10: 16:00:00 3 mL via RESPIRATORY_TRACT
  Filled 2021-08-10: qty 3

## 2021-08-10 MED ORDER — PANTOPRAZOLE 80MG IVPB - SIMPLE MED
80.0000 mg | Freq: Once | INTRAVENOUS | Status: AC
  Administered 2021-08-10: 13:00:00 80 mg via INTRAVENOUS
  Filled 2021-08-10: qty 100

## 2021-08-10 MED ORDER — TRAZODONE HCL 50 MG PO TABS
50.0000 mg | ORAL_TABLET | Freq: Every day | ORAL | Status: DC
  Administered 2021-08-10: 22:00:00 50 mg via ORAL
  Filled 2021-08-10: qty 1

## 2021-08-10 MED ORDER — MELATONIN 5 MG PO TABS
5.0000 mg | ORAL_TABLET | Freq: Every day | ORAL | Status: DC
  Administered 2021-08-11 (×2): 5 mg via ORAL
  Filled 2021-08-10 (×3): qty 1

## 2021-08-10 MED ORDER — PANTOPRAZOLE SODIUM 40 MG IV SOLR: 40.0000 mg | Freq: Two times a day (BID) | INTRAVENOUS | Status: DC

## 2021-08-10 MED ORDER — FERROUS SULFATE 325 (65 FE) MG PO TABS
325.0000 mg | ORAL_TABLET | Freq: Every day | ORAL | Status: DC
  Administered 2021-08-11 – 2021-08-12 (×2): 325 mg via ORAL
  Filled 2021-08-10 (×2): qty 1

## 2021-08-10 MED ORDER — TRAZODONE HCL 50 MG PO TABS
150.0000 mg | ORAL_TABLET | Freq: Every day | ORAL | Status: DC
  Administered 2021-08-11: 20:00:00 150 mg via ORAL
  Filled 2021-08-10: qty 3

## 2021-08-10 MED ORDER — PANTOPRAZOLE INFUSION (NEW) - SIMPLE MED
8.0000 mg/h | INTRAVENOUS | Status: DC
  Administered 2021-08-10 – 2021-08-11 (×4): 8 mg/h via INTRAVENOUS
  Filled 2021-08-10 (×2): qty 80
  Filled 2021-08-10: qty 100

## 2021-08-10 MED ORDER — METHYLPREDNISOLONE SODIUM SUCC 125 MG IJ SOLR: 125.0000 mg | Freq: Once | INTRAMUSCULAR | Status: DC

## 2021-08-10 MED ORDER — ENSURE ENLIVE PO LIQD
237.0000 mL | Freq: Two times a day (BID) | ORAL | Status: DC
  Administered 2021-08-11: 02:00:00 237 mL via ORAL

## 2021-08-10 NOTE — H&P (Signed)
History and Physical  Trevor Ferguson TSV:[id number] DOB: 10/15/1925 DOA: 08/10/2021  Referring physician: Vladimir Crofts, MD  PCP: [name], MD  Patient coming from: SNF  Chief Complaint: Abnormal lab  HPI: Trevor Ferguson is a 87 y.o. male with medical history significant for COPD on 3-4 L of oxygen via [place] 24/7, hypertension, hyperlipidemia, sleep apnea, CKD stage IV and history of stroke who presents to the emergency department due to low hemoglobin on outpatient lab.  Patient complained of increased weakness and tiredness within past week and endorsed dark-colored stools and melena within same time. Patient was recently admitted from 11/3-11/30 due to diverticulitis with perforation which resulted in exploratory laparotomy and small bowel resection and acute hypoxic respiratory failure secondary to atelectasis versus early pneumonia/COPD.  ED Course:  In the emergency department, patient was intermittently tachypneic, but other vital signs were within normal range.  Work-up in the ED showed hemoglobin of 5.8, BUN/creatinine 38/2.46 (creatinine is within baseline range), albumin 3.4, lipase 57.  Influenza A, B, SARS coronavirus 2 was negative. Chest x-ray showed Patchy airspace disease at the bases bilaterally and progressive in the right base since prior study. Features may reflect bibasilar atelectasis or pneumonia. CT abdomen and pelvis without contrast showed right pleural effusion larger than on the previous exam.  Dependent atelectasis in the right lower lung.  Patchy bilateral pulmonary infiltrates consistent with bronchopneumonia, possibly viral. 2 units of PRBCs was transfused, patient was started on Protonix drip, DuoNeb treatment was provided and Tylenol was given.  Gastroenterology was consulted and endoscopy will possibly be held until patient becomes more stable to be able to tolerate the procedure.  Review of Systems: Constitutional: Negative for chills and fever.  HENT:  Negative for ear pain and sore throat.   Eyes: Negative for pain and visual disturbance.  Respiratory: Positive for shortness of breath.  Negative for cough.   Cardiovascular: Negative for chest pain and palpitations.  Gastrointestinal: Positive for abdominal pain and dark-colored stools.  Negative for vomiting.  Endocrine: Negative for polyphagia and polyuria.  Genitourinary: Negative for decreased urine volume, dysuria, enuresis Musculoskeletal: Negative for arthralgias and back pain.  Skin: Negative for color change and rash.  Allergic/Immunologic: Negative for immunocompromised state.  Neurological: Positive for weakness.  Negative for tremors, syncope, speech difficulty Hematological: Does not bruise/bleed easily.  All other systems reviewed and are negative  Past Medical History:  Diagnosis Date   COPD (chronic obstructive pulmonary disease) ([place])    Hypertension    Pneumonia    2012,1985 hospitalized   Prostate cancer [facility])    with seed placement   Sleep apnea    noncompliant, dx 1996   Stroke ([place]) 2012   no residuals   Past Surgical History:  Procedure Laterality Date   adenoid     APPENDECTOMY     BOWEL RESECTION N/A 06/22/2021   Procedure: SMALL BOWEL RESECTION;  Surgeon: Herbert Pun, MD;  Location: [facility] ORS;  Service: General;  Laterality: N/A;   COLONOSCOPY WITH PROPOFOL N/A 01/28/2021   Procedure: COLONOSCOPY WITH PROPOFOL;  Surgeon: Lucilla Lame, MD;  Location: [facility] ENDOSCOPY;  Service: Endoscopy;  Laterality: N/A;  GI bleed   ENTEROSCOPY N/A 01/30/2021   Procedure: ENTEROSCOPY;  Surgeon: Lin Landsman, MD;  Location: [facility] ENDOSCOPY;  Service: Gastroenterology;  Laterality: N/A;   ESOPHAGOGASTRODUODENOSCOPY (EGD) WITH PROPOFOL N/A 01/27/2021   Procedure: ESOPHAGOGASTRODUODENOSCOPY (EGD) WITH PROPOFOL;  Surgeon: Lucilla Lame, MD;  Location: [facility] ENDOSCOPY;  Service: Endoscopy;  Laterality: N/A;  GIVENS CAPSULE STUDY N/A 01/28/2021   Procedure: GIVENS  CAPSULE STUDY;  Surgeon: Lin Landsman, MD;  Location: [facility] ENDOSCOPY;  Service: Gastroenterology;  Laterality: N/A;   HEMORRHOID SURGERY     LAPAROTOMY N/A 06/22/2021   Procedure: EXPLORATORY LAPAROTOMY;  Surgeon: Herbert Pun, MD;  Location: [facility] ORS;  Service: General;  Laterality: N/A;   spine cyst     TONSILLECTOMY      Social History:  reports that he quit smoking about 36 years ago. His smoking use included cigarettes. He has never used smokeless tobacco. He reports current alcohol use of about 14.0 standard drinks per week. He reports that he does not use drugs.   Allergies  Allergen Reactions   Penicillins Hives, Rash and Other (See Comments)    Rash on hands and blisters on ankles when 87 years old Patient tolerates Zosyn   Bee Venom Palpitations    INSECT BITES/STINGS   Darvon [Propoxyphene] Nausea Only and Rash    Family History  Problem Relation Age of Onset   Congestive Heart Failure Mother    COPD Sister    Asthma Sister    Tuberculosis Maternal Grandmother    Stroke Maternal Grandfather      Prior to Admission medications   Medication Sig Start Date End Date Taking? Authorizing Provider  acidophilus (RISAQUAD) CAPS capsule Take 2 capsules by mouth 3 (three) times daily. 02/01/21  Yes Vann, Jessica U, DO  amLODipine (NORVASC) 5 MG tablet Take 1 tablet (5 mg total) by mouth daily. 07/18/21  Yes Nicole Kindred A, DO  Cholecalciferol (VITAMIN D-3) 25 MCG (1000 UT) CAPS Take 1,000 Units by mouth daily.    Yes [provider]  escitalopram (LEXAPRO) 10 MG tablet Take 10 mg by mouth daily.    Yes [provider]  famotidine (PEPCID) 10 MG tablet Take 1 tablet (10 mg total) by mouth 2 (two) times daily. 07/18/21  Yes Nicole Kindred A, DO  ferrous sulfate 325 (65 FE) MG tablet Take 1 tablet (325 mg total) by mouth daily with breakfast. 07/18/21  Yes Nicole Kindred A, DO  fluticasone-salmeterol (ADVAIR) 250-50 MCG/ACT AEPB Inhale 1 puff  into the lungs 2 (two) times daily. 08/03/21  Yes [provider]  loratadine (CLARITIN) 10 MG tablet Take 10 mg by mouth daily.   Yes [provider]  melatonin 5 MG TABS Take 5 mg by mouth at bedtime.   Yes [provider]  omeprazole (PRILOSEC) 20 MG capsule Take 20 mg by mouth daily.   Yes [provider]  torsemide 60 MG TABS Take 60 mg by mouth daily. 07/19/21  Yes Nicole Kindred A, DO  traZODone (DESYREL) 150 MG tablet Take 150 mg by mouth at bedtime.   Yes [provider]  acetaminophen (TYLENOL) 325 MG tablet Take 2 tablets (650 mg total) by mouth every 6 (six) hours as needed for mild pain (or Fever >/= 101). 07/18/21   Nicole Kindred A, DO  albuterol (ACCUNEB) 0.63 MG/3ML nebulizer solution 3 (three) times daily as needed. 03/31/20   [provider]  albuterol (VENTOLIN HFA) 108 (90 Base) MCG/ACT inhaler Inhale 1-2 puffs into the lungs every 6 (six) hours as needed for wheezing or shortness of breath.    [provider]  Bacitracin-Polymyxin B (SM DOUBLE ANTIBIOTIC) 500-10000 UNIT/GM OINT Apply 1 application topically 2 (two) times daily. Patient not taking: Reported on 08/10/2021 07/18/21   Nicole Kindred A, DO  budesonide (PULMICORT) 0.5 MG/2ML nebulizer solution Take 0.5 mg  by nebulization daily.    [provider]  feeding supplement (ENSURE ENLIVE / ENSURE PLUS) LIQD Take 237 mLs by mouth 2 (two) times daily between meals. 07/19/21   Ezekiel Slocumb, DO  fluticasone (FLONASE) 50 MCG/ACT nasal spray Place 2 sprays into both nostrils daily. 07/19/21   Ezekiel Slocumb, DO  loperamide (IMODIUM) 2 MG capsule Take 1 capsule (2 mg total) by mouth as needed for diarrhea or loose stools. 07/18/21   Nicole Kindred A, DO  ondansetron (ZOFRAN) 4 MG tablet Take 4 mg by mouth every 8 (eight) hours as needed for nausea or vomiting.    [provider]  oxyCODONE-acetaminophen (PERCOCET/ROXICET) 5-325 MG tablet Take 1  tablet by mouth every 6 (six) hours as needed for moderate pain. 07/05/21   Oswald Hillock, MD  simethicone (MYLICON) 80 MG chewable tablet Chew 1 tablet (80 mg total) by mouth 4 (four) times daily as needed for flatulence. 07/18/21   Ezekiel Slocumb, DO  sodium chloride (OCEAN) 0.65 % SOLN nasal spray Place 1 spray into both nostrils as needed for congestion (nose irritation). Patient not taking: Reported on 08/10/2021 07/18/21   Nicole Kindred A, DO  triamcinolone cream (KENALOG) 0.1 % Apply 1 application topically 2 (two) times daily.    [provider]  umeclidinium-vilanterol (ANORO ELLIPTA) 62.5-25 MCG/ACT AEPB Inhale 2 puffs into the lungs daily. Patient not taking: Reported on 08/10/2021 07/19/21   Ezekiel Slocumb, DO    Physical Exam: BP (!) 133/50    Pulse 95    Temp 98.1 F (36.7 C) (Oral)    Resp (!) 21    Ht 5' 8.5" (1.74 m)    Wt 104.4 kg    SpO2 96%    BMI 34.49 kg/m   General: 87 y.o. year-old male well developed well nourished in no acute distress.  Alert and oriented x3. HEENT: NCAT, EOMI Neck: Supple, trachea medial Cardiovascular: Regular rate and rhythm with no rubs or gallops.  No thyromegaly or JVD noted.  No lower extremity edema. 2/4 pulses in all 4 extremities. Respiratory: Tachypneic.  Mild scattered wheezes.  No rales. Abdomen: Soft, nontender nondistended with normal bowel sounds x4 quadrants. Muskuloskeletal: No cyanosis, clubbing or edema noted bilaterally Neuro: CN II-XII intact, strength 5/5 x 4, sensation, reflexes intact Skin: No ulcerative lesions noted or rashes Psychiatry: Mood is appropriate for condition and setting          Labs on Admission:  Basic Metabolic Panel: Recent Labs  Lab 08/10/21 1252  NA 136  K 4.2  CL 99  CO2 28  GLUCOSE 109*  BUN 38*  CREATININE 2.46*  CALCIUM 8.6*   Liver Function Tests: Recent Labs  Lab 08/10/21 1252  AST 19  ALT 19  ALKPHOS 81  BILITOT 0.7  PROT 6.5  ALBUMIN 3.4*   Recent Labs   Lab 08/10/21 1121  LIPASE 57*   No results for input(s): AMMONIA in the last 168 hours. CBC: Recent Labs  Lab 08/10/21 1252  WBC 7.1  NEUTROABS 6.0  HGB 5.8*  HCT 17.2*  MCV 92.5  PLT 166   Cardiac Enzymes: No results for input(s): CKTOTAL, CKMB, CKMBINDEX, TROPONINI in the last 168 hours.  BNP (last 3 results) Recent Labs    07/05/21 1753  BNP 823.9*    ProBNP (last 3 results) No results for input(s): PROBNP in the last 8760 hours.  CBG: No results for input(s): GLUCAP in the last 168 hours.  Radiological Exams on  Admission: CT ABDOMEN PELVIS WO CONTRAST  Result Date: 08/10/2021 CLINICAL DATA:  Recent perforated diverticulitis. Upper gastrointestinal bleeding. Right-sided abdominal pain. EXAM: CT ABDOMEN AND PELVIS WITHOUT CONTRAST TECHNIQUE: Multidetector CT imaging of the abdomen and pelvis was performed following the standard protocol without IV contrast. COMPARISON:  06/26/2021 FINDINGS: Lower chest: Right effusion is slightly larger with dependent atelectasis in the right lung. Newly seen widespread patchy pulmonary infiltrates consistent with bronchopneumonia, possibly viral. Consider coronavirus. Small pericardial effusion is present. Hepatobiliary: Liver parenchyma is normal.  No calcified gallstones. Pancreas: Normal Spleen: Normal Adrenals/Urinary Tract: Adrenal glands are normal. No acute renal finding. Vascular calcification in both kidneys. Left renal cyst. No evidence of obstruction or inflammatory change. Bladder is normal. Stomach/Bowel: Stomach appears normal by CT. Previous small bowel anastomosis without complicating feature by CT. There is what appears to be a giant diverticulum arising from the fourth portion of the duodenum, without acute feature. No acute or finding related to the colon. Vascular/Lymphatic: Aortic atherosclerosis. No aneurysm. IVC is normal. Reproductive: Prostate seed implants in place. Other: No free fluid or air. Musculoskeletal:  Scoliosis and degenerative change of the spine. IMPRESSION: Right pleural effusion larger than on the previous exam. Dependent atelectasis in the right lower lung. Patchy bilateral pulmonary infiltrates consistent with bronchopneumonia, possibly viral. No distinct explanation for gastrointestinal bleeding. No evidence of complication at the small bowel anastomosis. Giant diverticulum of the distal duodenum without acute feature by CT. Aortic Atherosclerosis (ICD10-I70.0). Electronically Signed   By: Nelson Chimes M.D.   On: 08/10/2021 14:33   DG Chest Portable 1 View  Result Date: 08/10/2021 CLINICAL DATA:  Dyspnea. EXAM: PORTABLE CHEST 1 VIEW COMPARISON:  07/14/2021 FINDINGS: The cardio pericardial silhouette is enlarged. Interstitial markings are diffusely coarsened with chronic features. Patchy airspace disease again noted left base with increased patchy airspace opacity in the right lower lung today. Possible tiny left effusion. The visualized bony structures of the thorax show no acute abnormality. Telemetry leads overlie the chest. IMPRESSION: Patchy airspace disease at the bases bilaterally and progressive in the right base since prior study. Features may reflect bibasilar atelectasis or pneumonia. Possible tiny left effusion. Electronically Signed   By: Misty Stanley M.D.   On: 08/10/2021 13:00    EKG: I independently viewed the EKG done and my findings are as followed: Sinus tachycardia at a rate of 111 bpm  Assessment/Plan Present on Admission:  Symptomatic anemia  Principal Problem:   Symptomatic anemia   Symptomatic anemia possible secondary to GI bleed Iron deficiency anemia H/H= 5.8/17.2, this was 9.4/28.2 on 07/15/2021 Type and screen was drawn and 2 units of PRBC was transfused in the ED Continue IV Protonix drip Gastroenterologist consult was appreciated  Possible bronchopneumonia per CT findings CT abdomen and pelvis without contrast was suggestive of bronchial pneumonia and  suspected to be viral in nature. Patient denies cough, nasal congestion, fever, worsening shortness of breath or any other respiratory distress Procalcitonin will be checked prior to starting patient on antibiotics  COPD Continue duo nebs, Mucinex Continue incentive spirometry and flutter valve Continue supplemental oxygen to maintain O2 sat > 92%   CKD stage IV BUN/creatinine 38/2.46 (creatinine is within baseline range)  Renally adjust medications, avoid nephrotoxic agents/dehydration/hypotension  Essential hypertension ( well-controlled) Continue amlodipine  GERD Continue Protonix  Iron deficiency anemia Continue ferrous sulfate   DVT prophylaxis: SCDs  Code Status: DNR  Family Communication: Daughter at bedside (all questions answered to satisfaction)  Disposition Plan:  Patient is from:  home Anticipated DC to:                   SNF or family members home Anticipated DC date:               2-3 days Anticipated DC barriers:         Patient requires inpatient management due to GI bleed requiring blood transfusion    Consults called: Gastroenterology  Admission status: Inpatient    Bernadette Hoit MD [facility]  08/10/2021, 3:43 PM

## 2021-08-10 NOTE — Consult Note (Signed)
Vonda Antigua, MD [address] [place], [place], [place], [address] [address], [place], [place], [place], [address] Phone: [phone]  Fax: [phone]  Consultation  Referring Provider:     Dr. Josephine Cables Primary Care Physician:  [name], MD Reason for Consultation:     GI bleed  Date of Admission:  08/10/2021 Date of Consultation:  08/10/2021         HPI:   Trevor Ferguson is a 87 y.o. male who currently lives at a skilled nursing facility, presents for evaluation of low hemoglobin on outpatient blood work.  Patient has been feeling weak and tired over the past week.  Patient is alert and oriented and daughter is at bedside.  Patient states he never looks at his bowel movements, but does look at the tissue paper after wiping and the stool in the tissue paper has been brown, with no black stool or red stool on the tissue paper itself.  None of the nursing staff at the facility has reported any discoloration in bowel movements to him.  Last bowel movement was this morning   patient was recently discharged on 07/18/2021 after undergoing exploratory laparotomy and small bowel resection on 06/22/2021 for diverticulitis with perforation  Patient also was admitted with melena in June 2022 and underwent EGD, colonoscopy, capsule study and push enteroscopy on that admission.  As per notes from that admission by Dr. Marius Ditch:  "Patient underwent upper endoscopy on 6/11 which was unremarkable, followed by colonoscopy on 6/12 which revealed black stool in the terminal ileum.  Subsequently, underwent video capsule endoscopy on 6/12 which revealed diffuse small bowel diverticulosis and localized areas of oozing in the proximal small bowel.  Subsequently, patient underwent push enteroscopy on 6/14 which revealed a diminutive clean-based erosion in the proximal small bowel.  Less likely the source of anemia. "  Past Medical History:  Diagnosis Date   COPD (chronic obstructive  pulmonary disease) ([place])    Hypertension    Pneumonia    2012,1985 hospitalized   Prostate cancer [facility])    with seed placement   Sleep apnea    noncompliant, dx 1996   Stroke ([place]) 2012   no residuals    Past Surgical History:  Procedure Laterality Date   adenoid     APPENDECTOMY     BOWEL RESECTION N/A 06/22/2021   Procedure: SMALL BOWEL RESECTION;  Surgeon: Herbert Pun, MD;  Location: [facility] ORS;  Service: General;  Laterality: N/A;   COLONOSCOPY WITH PROPOFOL N/A 01/28/2021   Procedure: COLONOSCOPY WITH PROPOFOL;  Surgeon: Lucilla Lame, MD;  Location: [facility] ENDOSCOPY;  Service: Endoscopy;  Laterality: N/A;  GI bleed   ENTEROSCOPY N/A 01/30/2021   Procedure: ENTEROSCOPY;  Surgeon: Lin Landsman, MD;  Location: [facility] ENDOSCOPY;  Service: Gastroenterology;  Laterality: N/A;   ESOPHAGOGASTRODUODENOSCOPY (EGD) WITH PROPOFOL N/A 01/27/2021   Procedure: ESOPHAGOGASTRODUODENOSCOPY (EGD) WITH PROPOFOL;  Surgeon: Lucilla Lame, MD;  Location: [facility] ENDOSCOPY;  Service: Endoscopy;  Laterality: N/A;   GIVENS CAPSULE STUDY N/A 01/28/2021   Procedure: GIVENS CAPSULE STUDY;  Surgeon: Lin Landsman, MD;  Location: [facility] ENDOSCOPY;  Service: Gastroenterology;  Laterality: N/A;   HEMORRHOID SURGERY     LAPAROTOMY N/A 06/22/2021   Procedure: EXPLORATORY LAPAROTOMY;  Surgeon: Herbert Pun, MD;  Location: [facility] ORS;  Service: General;  Laterality: N/A;   spine cyst     TONSILLECTOMY      Prior to Admission medications   Medication Sig Start Date End Date Taking? Authorizing  Provider  acidophilus (RISAQUAD) CAPS capsule Take 2 capsules by mouth 3 (three) times daily. 02/01/21  Yes Vann, Jessica U, DO  amLODipine (NORVASC) 5 MG tablet Take 1 tablet (5 mg total) by mouth daily. 07/18/21  Yes Nicole Kindred A, DO  Cholecalciferol (VITAMIN D-3) 25 MCG (1000 UT) CAPS Take 1,000 Units by mouth daily.    Yes [provider]  escitalopram (LEXAPRO) 10 MG tablet  Take 10 mg by mouth daily.    Yes [provider]  famotidine (PEPCID) 10 MG tablet Take 1 tablet (10 mg total) by mouth 2 (two) times daily. 07/18/21  Yes Nicole Kindred A, DO  ferrous sulfate 325 (65 FE) MG tablet Take 1 tablet (325 mg total) by mouth daily with breakfast. 07/18/21  Yes Nicole Kindred A, DO  fluticasone-salmeterol (ADVAIR) 250-50 MCG/ACT AEPB Inhale 1 puff into the lungs 2 (two) times daily. 08/03/21  Yes [provider]  loratadine (CLARITIN) 10 MG tablet Take 10 mg by mouth daily.   Yes [provider]  melatonin 5 MG TABS Take 5 mg by mouth at bedtime.   Yes [provider]  omeprazole (PRILOSEC) 20 MG capsule Take 20 mg by mouth daily.   Yes [provider]  torsemide 60 MG TABS Take 60 mg by mouth daily. 07/19/21  Yes Nicole Kindred A, DO  traZODone (DESYREL) 150 MG tablet Take 150 mg by mouth at bedtime.   Yes [provider]  acetaminophen (TYLENOL) 325 MG tablet Take 2 tablets (650 mg total) by mouth every 6 (six) hours as needed for mild pain (or Fever >/= 101). 07/18/21   Nicole Kindred A, DO  albuterol (ACCUNEB) 0.63 MG/3ML nebulizer solution 3 (three) times daily as needed. 03/31/20   [provider]  albuterol (VENTOLIN HFA) 108 (90 Base) MCG/ACT inhaler Inhale 1-2 puffs into the lungs every 6 (six) hours as needed for wheezing or shortness of breath.    [provider]  Bacitracin-Polymyxin B (SM DOUBLE ANTIBIOTIC) 500-10000 UNIT/GM OINT Apply 1 application topically 2 (two) times daily. Patient not taking: Reported on 08/10/2021 07/18/21   Nicole Kindred A, DO  budesonide (PULMICORT) 0.5 MG/2ML nebulizer solution Take 0.5 mg by nebulization daily.    [provider]  feeding supplement (ENSURE ENLIVE / ENSURE PLUS) LIQD Take 237 mLs by mouth 2 (two) times daily between meals. 07/19/21   Ezekiel Slocumb, DO  fluticasone (FLONASE) 50 MCG/ACT nasal spray Place 2 sprays into both  nostrils daily. 07/19/21   Ezekiel Slocumb, DO  loperamide (IMODIUM) 2 MG capsule Take 1 capsule (2 mg total) by mouth as needed for diarrhea or loose stools. 07/18/21   Nicole Kindred A, DO  ondansetron (ZOFRAN) 4 MG tablet Take 4 mg by mouth every 8 (eight) hours as needed for nausea or vomiting.    [provider]  oxyCODONE-acetaminophen (PERCOCET/ROXICET) 5-325 MG tablet Take 1 tablet by mouth every 6 (six) hours as needed for moderate pain. 07/05/21   Oswald Hillock, MD  simethicone (MYLICON) 80 MG chewable tablet Chew 1 tablet (80 mg total) by mouth 4 (four) times daily as needed for flatulence. 07/18/21   Ezekiel Slocumb, DO  sodium chloride (OCEAN) 0.65 % SOLN nasal spray Place 1 spray into both nostrils as needed for congestion (nose irritation). Patient not taking: Reported on 08/10/2021 07/18/21   Nicole Kindred A, DO  triamcinolone cream (KENALOG) 0.1 % Apply 1 application topically 2 (two) times daily.    [provider]  umeclidinium-vilanterol (ANORO ELLIPTA) 62.5-25 MCG/ACT AEPB Inhale 2 puffs into the lungs daily. Patient not taking: Reported on 08/10/2021 07/19/21   Ezekiel Slocumb, DO    Family History  Problem Relation Age of Onset   Congestive Heart Failure Mother    COPD Sister    Asthma Sister    Tuberculosis Maternal Grandmother    Stroke Maternal Grandfather      Social History   Tobacco Use   Smoking status: Former    Years: 40.00    Types: Cigarettes    Quit date: 08/19/1985    Years since quitting: 36.0   Smokeless tobacco: Never  Vaping Use   Vaping Use: Never used  Substance Use Topics   Alcohol use: Yes    Alcohol/week: 14.0 standard drinks    Types: 14 Shots of liquor per week   Drug use: No    Allergies as of 08/10/2021 - Review Complete 08/10/2021  Allergen Reaction Noted   Penicillins Hives, Rash, and Other (See Comments) 03/28/2015   Bee venom Palpitations 11/13/2013   Darvon [propoxyphene] Nausea Only  and Rash 03/28/2015    Review of Systems:    All systems reviewed and negative except where noted in HPI.   Physical Exam:  Constitutional: General:   Alert,  Well-developed, well-nourished, pleasant and cooperative in NAD BP (!) 133/50    Pulse 95    Temp 98.1 F (36.7 C) (Oral)    Resp (!) 21    Ht 5' 8.5" (1.74 m)    Wt 104.4 kg    SpO2 96%    BMI 34.49 kg/m   Eyes:  Sclera clear, no icterus.   Conjunctiva pink. PERRLA  Ears:  No scars, lesions or masses, Normal auditory acuity. Nose:  No deformity, discharge, or lesions. Mouth:  No deformity or lesions, oropharynx pink & moist.  Neck:  Supple; no masses or thyromegaly.  Respiratory: Normal respiratory effort, Normal percussion  Gastrointestinal:  Normal bowel sounds.  No bruits.  Soft, non-tender and non-distended without masses, hepatosplenomegaly or hernias noted.  No guarding or rebound tenderness.     Cardiac: No clubbing or edema.  No cyanosis. Normal posterior tibial pedal pulses noted.  Lymphatic:  No significant cervical or axillary adenopathy.  Psych:  Alert and cooperative. Normal mood and affect.  Musculoskeletal:  Normal gait. Head normocephalic, atraumatic. Symmetrical without gross deformities. 5/5 Upper and Lower extremity strength bilaterally.  Skin: Warm. Intact without significant lesions or rashes. No jaundice.  Neurologic:  Face symmetrical, tongue midline, Normal sensation to touch;  grossly normal neurologically.  Psych:  Alert and oriented x3, Alert and cooperative. Normal mood and affect.   LAB RESULTS: Recent Labs    08/10/21 1252  WBC 7.1  HGB 5.8*  HCT 17.2*  PLT 166   BMET Recent Labs    08/10/21 1252  NA 136  K 4.2  CL 99  CO2 28  GLUCOSE 109*  BUN 38*  CREATININE 2.46*  CALCIUM 8.6*   LFT Recent Labs    08/10/21 1252  PROT 6.5  ALBUMIN 3.4*  AST 19  ALT 19  ALKPHOS 81  BILITOT 0.7   PT/INR Recent Labs    08/10/21 1252  LABPROT 14.6  INR 1.1     STUDIES: CT ABDOMEN PELVIS WO CONTRAST  Result Date: 08/10/2021 CLINICAL DATA:  Recent perforated diverticulitis. Upper gastrointestinal bleeding. Right-sided abdominal pain. EXAM: CT ABDOMEN AND PELVIS WITHOUT CONTRAST TECHNIQUE: Multidetector CT imaging of the abdomen and pelvis was performed following the  standard protocol without IV contrast. COMPARISON:  06/26/2021 FINDINGS: Lower chest: Right effusion is slightly larger with dependent atelectasis in the right lung. Newly seen widespread patchy pulmonary infiltrates consistent with bronchopneumonia, possibly viral. Consider coronavirus. Small pericardial effusion is present. Hepatobiliary: Liver parenchyma is normal.  No calcified gallstones. Pancreas: Normal Spleen: Normal Adrenals/Urinary Tract: Adrenal glands are normal. No acute renal finding. Vascular calcification in both kidneys. Left renal cyst. No evidence of obstruction or inflammatory change. Bladder is normal. Stomach/Bowel: Stomach appears normal by CT. Previous small bowel anastomosis without complicating feature by CT. There is what appears to be a giant diverticulum arising from the fourth portion of the duodenum, without acute feature. No acute or finding related to the colon. Vascular/Lymphatic: Aortic atherosclerosis. No aneurysm. IVC is normal. Reproductive: Prostate seed implants in place. Other: No free fluid or air. Musculoskeletal: Scoliosis and degenerative change of the spine. IMPRESSION: Right pleural effusion larger than on the previous exam. Dependent atelectasis in the right lower lung. Patchy bilateral pulmonary infiltrates consistent with bronchopneumonia, possibly viral. No distinct explanation for gastrointestinal bleeding. No evidence of complication at the small bowel anastomosis. Giant diverticulum of the distal duodenum without acute feature by CT. Aortic Atherosclerosis (ICD10-I70.0). Electronically Signed   By: Nelson Chimes M.D.   On: 08/10/2021 14:33   DG  Chest Portable 1 View  Result Date: 08/10/2021 CLINICAL DATA:  Dyspnea. EXAM: PORTABLE CHEST 1 VIEW COMPARISON:  07/14/2021 FINDINGS: The cardio pericardial silhouette is enlarged. Interstitial markings are diffusely coarsened with chronic features. Patchy airspace disease again noted left base with increased patchy airspace opacity in the right lower lung today. Possible tiny left effusion. The visualized bony structures of the thorax show no acute abnormality. Telemetry leads overlie the chest. IMPRESSION: Patchy airspace disease at the bases bilaterally and progressive in the right base since prior study. Features may reflect bibasilar atelectasis or pneumonia. Possible tiny left effusion. Electronically Signed   By: Misty Stanley M.D.   On: 08/10/2021 13:00      Impression / Plan:   Trevor Ferguson is a 87 y.o. y/o male with presentation for weakness, low hemoglobin at skilled nursing facility, with history of GI bleed in June 2022 with EGD, colonoscopy, capsule study and push enteroscopy done at that time, with capsule study showing oozing blood in the small bowel, clean-based erosion in the proximal bowel on push enteroscopy (not thought to be the likely cause of anemia), but no other clear etiology of GI bleed identified at that time  Given his very low hemoglobin on presentation, with an unclear source in June 2022, but blood seen in the small bowel on video capsule study, patient will likely need further evaluation of his low hemoglobin on this admission  However, he will need to be further medically optimized prior to any endoscopic procedures given his increased oxygenation requirements.  Patient states he is on home O2 and recently he increased it to 4 L as he was feeling short of breath and has underlying COPD.  His CT abdomen on admission does report a right pleural effusion, patchy bilateral pulmonary infiltrates consistent with bronchopneumonia  I have discussed with primary team, Dr.  Josephine Cables, that the above findings will need to be addressed and patient better medically optimized prior to proceeding with any endoscopic procedures  He is currently not having any signs of active GI bleeding and denies seeing any melena, or hematochezia at home, and is hemodynamically stable.  Therefore no indication for urgent endoscopy at this  time and medical optimization is best next step in this setting  If hemoglobin remains stable, and there are no signs of active GI bleeding, can advance diet while we are awaiting medical optimization  During his admission in June 2022 for GI bleed, and RBC scan was not done.  If he has active bleeding on this admission, depending on the rate of bleeding, and the clinical picture, we may consider RBC scan since he underwent panendoscopy in June 2022 already without a clear source  PPI IV twice daily  Continue serial CBCs and transfuse PRN Avoid NSAIDs Maintain 2 large-bore IV lines Please page GI with any acute hemodynamic changes, or signs of active GI bleeding.  Emergent or urgent endoscopy can be considered if active bleeding or hemodynamic changes from bleeding occur   Thank you for involving me in the care of this patient.      LOS: 0 days   Virgel Manifold, MD  08/10/2021, 4:28 PM

## 2021-08-10 NOTE — ED Triage Notes (Signed)
Pt comes into the ED via [facility] from [facility] c/o abnormal labs with a low Hgb.  Pt had N/V last week.  See First RN note.

## 2021-08-10 NOTE — ED Triage Notes (Signed)
Pt in via EMS from [facility] with c/o abnormal labs. Pt with NV for the last week and has low hbg. Pt also c/o discomfort to right foot. FSBS 209, 152/68, HR 86, pt on 4L of 02 at all times and sats 98%. #20g to left FA

## 2021-08-10 NOTE — ED Provider Notes (Signed)
[facility] Emergency Department Provider Note ____________________________________________   Event Date/Time   First MD Initiated Contact with Patient 08/10/21 1144     (approximate)  I have reviewed the triage vital signs and the nursing notes.  HISTORY  Chief Complaint Abnormal Lab   HPI Trevor Ferguson is a 87 y.o. malewho presents to the ED for evaluation of low hemoglobin on outpatient labs.  Chart review indicates he was admitted about a month ago due to acute diverticulitis with associated complications.  Perforation requiring bowel resection, postop ileus, COPD exacerbations and ultimately discharged back to his SNF. History of CHF and COPD at 2 L O2 at baseline.  CKD 3.  I am not seeing any blood thinners on his med list.  No antiplatelets either.  Presents to the ED via EMS from his SNF for evaluation of low hemoglobin on outpatient blood work.  He presents with his daughter who provides some supplemental history.  Patient reports feeling increasingly weak and tired over the past week.  Reports melena and dark-colored stools over the past "week or so."  He reports losing track of time due to being stuck in his SNF and is not sure how long he has been feeling the symptoms.  He reports some mild right-sided abdominal discomfort without emesis.  Reports 4/10 intensity right-sided abdominal pain is nonradiating.  Sharp.  He apparently had a hemoglobin of 6 and so was referred to the ED today.  Past Medical History:  Diagnosis Date   COPD (chronic obstructive pulmonary disease) ([place])    Hypertension    Pneumonia    2012,1985 hospitalized   Prostate cancer [facility])    with seed placement   Sleep apnea    noncompliant, dx 1996   Stroke ([place]) 2012   no residuals    Patient Active Problem List   Diagnosis Date Noted   Pulmonary edema    Acute on chronic respiratory failure with hypoxia (HCC)    Acute diastolic congestive heart failure (HCC)     Dyspnea    Pressure injury of skin 07/01/2021   Abdominal distension (gaseous)    AKI (acute kidney injury) ([place])    Diverticulitis    Hypoxia    Perforation of intestine due to diverticulitis of gastrointestinal tract    HAP (hospital-acquired pneumonia)    Acute GI bleeding    Polyp of sigmoid colon    Rectal bleeding    GI bleed 01/26/2021   Benign hypertensive kidney disease with chronic kidney disease 01/08/2021   Hemorrhoids    Rash    Depression    Weakness    Abdominal pain 03/06/2020   Acute kidney injury superimposed on CKD ([place]) 03/06/2020   Anemia 03/06/2020   Acute diverticulitis 03/01/2020   Pneumonia 07/17/2018   Leg swelling 12/18/2015   Chronic diastolic CHF (congestive heart failure) ([place]) 07/18/2015   Essential hypertension 07/18/2015   Prostate cancer ([place]) 06/02/2015   COPD with acute exacerbation ([place]) 03/28/2015   Sleep apnea 03/28/2015   SOB (shortness of breath) 03/28/2015   Personal history of transient ischemic attack (TIA), and cerebral infarction without residual deficits 09/21/2014    Past Surgical History:  Procedure Laterality Date   adenoid     APPENDECTOMY     BOWEL RESECTION N/A 06/22/2021   Procedure: SMALL BOWEL RESECTION;  Surgeon: Herbert Pun, MD;  Location: [facility] ORS;  Service: General;  Laterality: N/A;   COLONOSCOPY WITH PROPOFOL N/A 01/28/2021   Procedure: COLONOSCOPY WITH PROPOFOL;  Surgeon: Lucilla Lame, MD;  Location: [facility] ENDOSCOPY;  Service: Endoscopy;  Laterality: N/A;  GI bleed   ENTEROSCOPY N/A 01/30/2021   Procedure: ENTEROSCOPY;  Surgeon: Lin Landsman, MD;  Location: [facility] ENDOSCOPY;  Service: Gastroenterology;  Laterality: N/A;   ESOPHAGOGASTRODUODENOSCOPY (EGD) WITH PROPOFOL N/A 01/27/2021   Procedure: ESOPHAGOGASTRODUODENOSCOPY (EGD) WITH PROPOFOL;  Surgeon: Lucilla Lame, MD;  Location: [facility] ENDOSCOPY;  Service: Endoscopy;  Laterality: N/A;   GIVENS CAPSULE STUDY N/A 01/28/2021   Procedure: GIVENS CAPSULE  STUDY;  Surgeon: Lin Landsman, MD;  Location: [facility] ENDOSCOPY;  Service: Gastroenterology;  Laterality: N/A;   HEMORRHOID SURGERY     LAPAROTOMY N/A 06/22/2021   Procedure: EXPLORATORY LAPAROTOMY;  Surgeon: Herbert Pun, MD;  Location: [facility] ORS;  Service: General;  Laterality: N/A;   spine cyst     TONSILLECTOMY      Prior to Admission medications   Medication Sig Start Date End Date Taking? Authorizing Provider  acidophilus (RISAQUAD) CAPS capsule Take 2 capsules by mouth 3 (three) times daily. 02/01/21  Yes Vann, Jessica U, DO  amLODipine (NORVASC) 5 MG tablet Take 1 tablet (5 mg total) by mouth daily. 07/18/21  Yes Nicole Kindred A, DO  Cholecalciferol (VITAMIN D-3) 25 MCG (1000 UT) CAPS Take 1,000 Units by mouth daily.    Yes [provider]  escitalopram (LEXAPRO) 10 MG tablet Take 10 mg by mouth daily.    Yes [provider]  famotidine (PEPCID) 10 MG tablet Take 1 tablet (10 mg total) by mouth 2 (two) times daily. 07/18/21  Yes Nicole Kindred A, DO  ferrous sulfate 325 (65 FE) MG tablet Take 1 tablet (325 mg total) by mouth daily with breakfast. 07/18/21  Yes Nicole Kindred A, DO  fluticasone-salmeterol (ADVAIR) 250-50 MCG/ACT AEPB Inhale 1 puff into the lungs 2 (two) times daily. 08/03/21  Yes [provider]  loratadine (CLARITIN) 10 MG tablet Take 10 mg by mouth daily.   Yes [provider]  melatonin 5 MG TABS Take 5 mg by mouth at bedtime.   Yes [provider]  omeprazole (PRILOSEC) 20 MG capsule Take 20 mg by mouth daily.   Yes [provider]  torsemide 60 MG TABS Take 60 mg by mouth daily. 07/19/21  Yes Nicole Kindred A, DO  traZODone (DESYREL) 150 MG tablet Take 150 mg by mouth at bedtime.   Yes [provider]  acetaminophen (TYLENOL) 325 MG tablet Take 2 tablets (650 mg total) by mouth every 6 (six) hours as needed for mild pain (or Fever >/= 101). 07/18/21   Nicole Kindred A, DO  albuterol  (ACCUNEB) 0.63 MG/3ML nebulizer solution 3 (three) times daily as needed. 03/31/20   [provider]  albuterol (VENTOLIN HFA) 108 (90 Base) MCG/ACT inhaler Inhale 1-2 puffs into the lungs every 6 (six) hours as needed for wheezing or shortness of breath.    [provider]  Bacitracin-Polymyxin B (SM DOUBLE ANTIBIOTIC) 500-10000 UNIT/GM OINT Apply 1 application topically 2 (two) times daily. Patient not taking: Reported on 08/10/2021 07/18/21   Nicole Kindred A, DO  budesonide (PULMICORT) 0.5 MG/2ML nebulizer solution Take 0.5 mg by nebulization daily.    [provider]  feeding supplement (ENSURE ENLIVE / ENSURE PLUS) LIQD Take 237 mLs by mouth 2 (two) times daily between meals. 07/19/21   Ezekiel Slocumb, DO  fluticasone (FLONASE) 50 MCG/ACT nasal spray Place 2 sprays into both nostrils daily. 07/19/21   Ezekiel Slocumb, DO  loperamide (IMODIUM) 2 MG capsule  Take 1 capsule (2 mg total) by mouth as needed for diarrhea or loose stools. 07/18/21   Nicole Kindred A, DO  ondansetron (ZOFRAN) 4 MG tablet Take 4 mg by mouth every 8 (eight) hours as needed for nausea or vomiting.    [provider]  oxyCODONE-acetaminophen (PERCOCET/ROXICET) 5-325 MG tablet Take 1 tablet by mouth every 6 (six) hours as needed for moderate pain. 07/05/21   Oswald Hillock, MD  simethicone (MYLICON) 80 MG chewable tablet Chew 1 tablet (80 mg total) by mouth 4 (four) times daily as needed for flatulence. 07/18/21   Ezekiel Slocumb, DO  sodium chloride (OCEAN) 0.65 % SOLN nasal spray Place 1 spray into both nostrils as needed for congestion (nose irritation). Patient not taking: Reported on 08/10/2021 07/18/21   Nicole Kindred A, DO  triamcinolone cream (KENALOG) 0.1 % Apply 1 application topically 2 (two) times daily.    [provider]  umeclidinium-vilanterol (ANORO ELLIPTA) 62.5-25 MCG/ACT AEPB Inhale 2 puffs into the lungs daily. Patient not taking: Reported on 08/10/2021  07/19/21   Nicole Kindred A, DO    Allergies Penicillins, Bee venom, and Darvon [propoxyphene]  Family History  Problem Relation Age of Onset   Congestive Heart Failure Mother    COPD Sister    Asthma Sister    Tuberculosis Maternal Grandmother    Stroke Maternal Grandfather     Social History Social History   Tobacco Use   Smoking status: Former    Years: 40.00    Types: Cigarettes    Quit date: 08/19/1985    Years since quitting: 36.0   Smokeless tobacco: Never  Vaping Use   Vaping Use: Never used  Substance Use Topics   Alcohol use: Yes    Alcohol/week: 14.0 standard drinks    Types: 14 Shots of liquor per week   Drug use: No    Review of Systems  Constitutional: No fever/chills Eyes: No visual changes. ENT: No sore throat. Cardiovascular: Denies chest pain. Respiratory: Positive for shortness of breath. Gastrointestinal: Positive for abdominal pain and melena.  No constipation. Genitourinary: Negative for dysuria. Musculoskeletal: Negative for back pain. Skin: Negative for rash. Neurological: Negative for headaches, focal weakness or numbness.  Difficult to accurately obtain ____________________________________________   PHYSICAL EXAM:  VITAL SIGNS: Vitals:   08/10/21 1436 08/10/21 1452  BP: (!) 142/57 (!) 133/50  Pulse: 93 95  Resp: 16 (!) 21  Temp: 98.1 F (36.7 C) 98.1 F (36.7 C)  SpO2: 100% 96%    Constitutional: Alert and oriented.  Obese and chronically ill-appearing.  Telling jokes while I places ultrasound IV. Eyes: Conjunctivae are normal. PERRL. EOMI. Head: Atraumatic. Nose: No congestion/rhinnorhea. Mouth/Throat: Mucous membranes are moist.  Oropharynx non-erythematous. Neck: No stridor. No cervical spine tenderness to palpation. Cardiovascular: Tachycardic rate, regular rhythm. Grossly normal heart sounds.  Good peripheral circulation. Respiratory: Tachypneic to nearly 30 without retractions or distress.  Telling jokes and speaking  in full sentences.  Prolonged expiratory phase and faint and scattered expiratory wheezes. Gastrointestinal: Soft , nondistended, Mild right-sided abdominal tenderness that is poorly localizing.  No peritoneal features. Musculoskeletal: No lower extremity tenderness.  No joint effusions. No signs of acute trauma. Neurologic:  Normal speech and language. No gross focal neurologic deficits are appreciated. Skin:  Skin is warm, dry and intact. No rash noted. Psychiatric: Mood and affect are normal. Speech and behavior are normal. ____________________________________________   LABS (all labs ordered are listed, but only abnormal results are displayed)  Labs Reviewed  COMPREHENSIVE METABOLIC PANEL - Abnormal; Notable for the following components:      Result Value   Glucose, Bld 109 (*)    BUN 38 (*)    Creatinine, Ser 2.46 (*)    Calcium 8.6 (*)    Albumin 3.4 (*)    GFR, Estimated 24 (*)    All other components within normal limits  CBC WITH DIFFERENTIAL/PLATELET - Abnormal; Notable for the following components:   RBC 1.86 (*)    Hemoglobin 5.8 (*)    HCT 17.2 (*)    RDW 16.7 (*)    Lymphs Abs 0.6 (*)    All other components within normal limits  LIPASE, BLOOD - Abnormal; Notable for the following components:   Lipase 57 (*)    All other components within normal limits  RESP PANEL BY RT-PCR (FLU A&B, COVID) ARPGX2  PROTIME-INR  PROCALCITONIN  TYPE AND SCREEN  PREPARE RBC (CROSSMATCH)   ____________________________________________  12 Lead EKG  Sinus tachycardia with a rate of 111 bpm.  PACs.  Normal axis and intervals.  No STEMI. ____________________________________________  RADIOLOGY  ED MD interpretation: 1 view CXR reviewed by me with patchy bilateral airspace disease, slightly worse from previous CXR  Official radiology report(s): CT ABDOMEN PELVIS WO CONTRAST  Result Date: 08/10/2021 CLINICAL DATA:  Recent perforated diverticulitis. Upper gastrointestinal  bleeding. Right-sided abdominal pain. EXAM: CT ABDOMEN AND PELVIS WITHOUT CONTRAST TECHNIQUE: Multidetector CT imaging of the abdomen and pelvis was performed following the standard protocol without IV contrast. COMPARISON:  06/26/2021 FINDINGS: Lower chest: Right effusion is slightly larger with dependent atelectasis in the right lung. Newly seen widespread patchy pulmonary infiltrates consistent with bronchopneumonia, possibly viral. Consider coronavirus. Small pericardial effusion is present. Hepatobiliary: Liver parenchyma is normal.  No calcified gallstones. Pancreas: Normal Spleen: Normal Adrenals/Urinary Tract: Adrenal glands are normal. No acute renal finding. Vascular calcification in both kidneys. Left renal cyst. No evidence of obstruction or inflammatory change. Bladder is normal. Stomach/Bowel: Stomach appears normal by CT. Previous small bowel anastomosis without complicating feature by CT. There is what appears to be a giant diverticulum arising from the fourth portion of the duodenum, without acute feature. No acute or finding related to the colon. Vascular/Lymphatic: Aortic atherosclerosis. No aneurysm. IVC is normal. Reproductive: Prostate seed implants in place. Other: No free fluid or air. Musculoskeletal: Scoliosis and degenerative change of the spine. IMPRESSION: Right pleural effusion larger than on the previous exam. Dependent atelectasis in the right lower lung. Patchy bilateral pulmonary infiltrates consistent with bronchopneumonia, possibly viral. No distinct explanation for gastrointestinal bleeding. No evidence of complication at the small bowel anastomosis. Giant diverticulum of the distal duodenum without acute feature by CT. Aortic Atherosclerosis (ICD10-I70.0). Electronically Signed   By: Nelson Chimes M.D.   On: 08/10/2021 14:33   DG Chest Portable 1 View  Result Date: 08/10/2021 CLINICAL DATA:  Dyspnea. EXAM: PORTABLE CHEST 1 VIEW COMPARISON:  07/14/2021 FINDINGS: The cardio  pericardial silhouette is enlarged. Interstitial markings are diffusely coarsened with chronic features. Patchy airspace disease again noted left base with increased patchy airspace opacity in the right lower lung today. Possible tiny left effusion. The visualized bony structures of the thorax show no acute abnormality. Telemetry leads overlie the chest. IMPRESSION: Patchy airspace disease at the bases bilaterally and progressive in the right base since prior study. Features may reflect bibasilar atelectasis or pneumonia. Possible tiny left effusion. Electronically Signed   By: Misty Stanley M.D.   On: 08/10/2021 13:00    ____________________________________________  PROCEDURES and INTERVENTIONS  Procedure(s) performed (including Critical Care):  .1-3 Lead EKG Interpretation Performed by: Vladimir Crofts, MD Authorized by: Vladimir Crofts, MD     Interpretation: abnormal     ECG rate:  104   ECG rate assessment: tachycardic     Rhythm: sinus tachycardia     Ectopy: none     Conduction: normal   Ultrasound ED Peripheral IV (Provider)  Date/Time: 08/10/2021 2:01 PM Performed by: Vladimir Crofts, MD Authorized by: Vladimir Crofts, MD   Procedure details:    Indications: poor IV access     Skin Prep: chlorhexidine gluconate     Location: right basilic v.   Angiocath:  18 G   Bedside Ultrasound Guided: Yes     Images: not archived     Patient tolerated procedure without complications: Yes     Dressing applied: Yes   .Critical Care Performed by: Vladimir Crofts, MD Authorized by: Vladimir Crofts, MD   Critical care provider statement:    Critical care time (minutes):  30   Critical care time was exclusive of:  Separately billable procedures and treating other patients   Critical care was necessary to treat or prevent imminent or life-threatening deterioration of the following conditions:  Circulatory failure   Critical care was time spent personally by me on the following activities:  Development  of treatment plan with patient or surrogate, discussions with consultants, evaluation of patient's response to treatment, examination of patient, ordering and review of laboratory studies, ordering and review of radiographic studies, ordering and performing treatments and interventions, pulse oximetry, re-evaluation of patient's condition and review of old charts  Medications  pantoprozole (PROTONIX) 80 mg /NS 100 mL infusion (8 mg/hr Intravenous New Bag/Given 08/10/21 1337)  pantoprazole (PROTONIX) injection 40 mg (has no administration in time range)  0.9 %  sodium chloride infusion (has no administration in time range)  ipratropium-albuterol (DUONEB) 0.5-2.5 (3) MG/3ML nebulizer solution 3 mL (has no administration in time range)  methylPREDNISolone sodium succinate (SOLU-MEDROL) 125 mg/2 mL injection 125 mg (has no administration in time range)  pantoprazole (PROTONIX) 80 mg /NS 100 mL IVPB (0 mg Intravenous Stopped 08/10/21 1336)    ____________________________________________   MDM / ED COURSE   87 year old male presents to the ED with symptomatic anemia that likely represents an upper GI bleed.  He is stable, but tachycardic and tachypneic.  Signs of COPD exacerbation with prolonged expiratory phase, wheezing and dyspnea.  We will get him started on duo nebs and steroids.  Blood work with normocytic anemia with hemoglobin of 5.8 that we will transfuse with 2 units of PRBCs.  With his symptoms of melena and his recent diverticular complications and surgeries, we will CT his abdomen.  Anticipate medical admission.  CT without evidence of surgical complications from his recent interventions.  No clear signs of etiology of his bleed, but Noncon study due to his renal dysfunction.  He has some mild right-sided abdominal tenderness, but no peritoneal features.  No signs of perforation on CT.  We will initiate protocols for upper GI bleeding including a PPI drip and PRBC transfusion.  He further  has signs of COPD exacerbation.  X-ray questions increased infiltrate versus atelectasis.  He otherwise appears noninfectious and does not have a leukocytosis.  No subjective symptoms to suggest CAP.  When reviewing his recent admission with a similar chest x-ray, pulmonology was consulted and recommended management for COPD exacerbation in a similar setting.  We will add a procalcitonin to help elucidate  any infectious pathology and hold antibiotics at this time, sticking with breathing treatments and steroids  Clinical Course as of 08/10/21 1511  Fri Aug 10, 2021  1243 USIV placed by me. We discussed management, if he would want a transfusion, workup and likely admission. [DS]    Clinical Course User Index [DS] Vladimir Crofts, MD    ____________________________________________   FINAL CLINICAL IMPRESSION(S) / ED DIAGNOSES  Final diagnoses:  Symptomatic anemia  Upper GI bleed  COPD exacerbation [facility])     ED Discharge Orders     None        Elina Streng   Note:  This document was prepared using Dragon voice recognition software and may include unintentional dictation errors.    Vladimir Crofts, MD 08/10/21 [phone]

## 2021-08-11 LAB — CREATININE, SERUM
Creatinine, Ser: 2.38 mg/dL — ABNORMAL HIGH (ref 0.61–1.24)
GFR, Estimated: 25 mL/min — ABNORMAL LOW

## 2021-08-11 LAB — COMPREHENSIVE METABOLIC PANEL
ALT: 16 U/L (ref 0–44)
AST: 17 U/L (ref 15–41)
Albumin: 2.6 g/dL — ABNORMAL LOW (ref 3.5–5.0)
Alkaline Phosphatase: 64 U/L (ref 38–126)
Anion gap: 8 (ref 5–15)
BUN: 39 mg/dL — ABNORMAL HIGH (ref 8–23)
CO2: 27 mmol/L (ref 22–32)
Calcium: 8.3 mg/dL — ABNORMAL LOW (ref 8.9–10.3)
Chloride: 99 mmol/L (ref 98–111)
Creatinine, Ser: 2.2 mg/dL — ABNORMAL HIGH (ref 0.61–1.24)
GFR, Estimated: 27 mL/min — ABNORMAL LOW (ref >60)
Glucose, Bld: 143 mg/dL — ABNORMAL HIGH (ref 70–99)
Potassium: 3.7 mmol/L (ref 3.5–5.1)
Sodium: 134 mmol/L — ABNORMAL LOW (ref 135–145)
Total Bilirubin: 1.6 mg/dL — ABNORMAL HIGH (ref 0.3–1.2)
Total Protein: 5.5 g/dL — ABNORMAL LOW (ref 6.5–8.1)

## 2021-08-11 LAB — TYPE AND SCREEN
ABO/RH(D): O POS
Antibody Screen: NEGATIVE
Unit division: 0
Unit division: 0

## 2021-08-11 LAB — CBC
HCT: 23.4 % — ABNORMAL LOW (ref 39.0–52.0)
HCT: 23.4 % — ABNORMAL LOW (ref 39.0–52.0)
Hemoglobin: 8.2 g/dL — ABNORMAL LOW (ref 13.0–17.0)
Hemoglobin: 8.1 g/dL — ABNORMAL LOW (ref 13.0–17.0)
MCH: 31.3 pg (ref 26.0–34.0)
MCH: 31.2 pg (ref 26.0–34.0)
MCHC: 35 g/dL (ref 30.0–36.0)
MCHC: 34.6 g/dL (ref 30.0–36.0)
MCV: 89.3 fL (ref 80.0–100.0)
MCV: 90 fL (ref 80.0–100.0)
Platelets: 168 10*3/uL (ref 150–400)
Platelets: 178 K/uL (ref 150–400)
RBC: 2.62 MIL/uL — ABNORMAL LOW (ref 4.22–5.81)
RBC: 2.6 MIL/uL — ABNORMAL LOW (ref 4.22–5.81)
RDW: 16.8 % — ABNORMAL HIGH (ref 11.5–15.5)
RDW: 16.7 % — ABNORMAL HIGH (ref 11.5–15.5)
WBC: 5.6 10*3/uL (ref 4.0–10.5)
WBC: 5.2 K/uL (ref 4.0–10.5)
nRBC: 0 % (ref 0.0–0.2)
nRBC: 0 % (ref 0.0–0.2)

## 2021-08-11 LAB — BPAM RBC
Blood Product Expiration Date: 202212272359
Blood Product Expiration Date: 202301182359
ISSUE DATE / TIME: 202212231426
ISSUE DATE / TIME: 202212231728
Unit Type and Rh: 5100
Unit Type and Rh: 9500

## 2021-08-11 LAB — APTT: aPTT: 32 seconds (ref 24–36)

## 2021-08-11 LAB — PROCALCITONIN: Procalcitonin: 0.22 ng/mL

## 2021-08-11 LAB — PHOSPHORUS: Phosphorus: 4.5 mg/dL (ref 2.5–4.6)

## 2021-08-11 LAB — MAGNESIUM: Magnesium: 1.5 mg/dL — ABNORMAL LOW (ref 1.7–2.4)

## 2021-08-11 MED ORDER — MAGNESIUM SULFATE 4 GM/100ML IV SOLN
4.0000 g | Freq: Once | INTRAVENOUS | Status: AC
  Administered 2021-08-11: 21:00:00 4 g via INTRAVENOUS
  Filled 2021-08-11: qty 100

## 2021-08-11 MED ORDER — ADULT MULTIVITAMIN W/MINERALS CH
1.0000 | ORAL_TABLET | Freq: Every day | ORAL | Status: DC
  Administered 2021-08-12: 12:00:00 1 via ORAL
  Filled 2021-08-11: qty 1

## 2021-08-11 MED ORDER — ENSURE ENLIVE PO LIQD
237.0000 mL | Freq: Three times a day (TID) | ORAL | Status: DC
  Administered 2021-08-11 – 2021-08-12 (×5): 237 mL via ORAL

## 2021-08-11 MED ORDER — SODIUM CHLORIDE 0.9 % IV SOLN
500.0000 mg | INTRAVENOUS | Status: DC
  Administered 2021-08-11: 10:00:00 500 mg via INTRAVENOUS
  Filled 2021-08-11: qty 500
  Filled 2021-08-11: qty 5

## 2021-08-11 MED ORDER — FUROSEMIDE 10 MG/ML IJ SOLN
80.0000 mg | Freq: Once | INTRAMUSCULAR | Status: AC
  Administered 2021-08-11: 21:00:00 80 mg via INTRAVENOUS
  Filled 2021-08-11 (×2): qty 8

## 2021-08-11 MED ORDER — PANTOPRAZOLE SODIUM 40 MG PO TBEC
40.0000 mg | DELAYED_RELEASE_TABLET | Freq: Two times a day (BID) | ORAL | Status: DC
  Administered 2021-08-11 – 2021-08-12 (×2): 40 mg via ORAL
  Filled 2021-08-11 (×2): qty 1

## 2021-08-11 NOTE — Progress Notes (Signed)
Initial Nutrition Assessment  DOCUMENTATION CODES:   Obesity unspecified  INTERVENTION:   Ensure Enlive po TID, each supplement provides 350 kcal and 20 grams of protein  MVI po daily   Dysphagia 3 diet   NUTRITION DIAGNOSIS:   Increased nutrient needs related to chronic illness (COPD, prostate cancer) as evidenced by estimated needs.  GOAL:   Patient will meet greater than or equal to 90% of their needs  MONITOR:   PO intake, Supplement acceptance, Labs, Weight trends, Skin, I & O's  REASON FOR ASSESSMENT:   Malnutrition Screening Tool    ASSESSMENT:   87 y.o. male with medical history significant for COPD, hypertension, hyperlipidemia, sleep apnea, CKD stage IV, prostate cancer, stoke and diverticulitis s/p ex lap and small bowel resection 11/4 (with removal of 33cm jejunum) who is admitted with anemia, possible GIB and PNA.  RD working remotely.  Pt is known to this RD from his recent previous admit. Pt with fairly good appetite and oral intake at baseline; pt eating well during his last admission and drinking Ensure supplements. Pt did require a mechanical soft diet previously; RD will re-order this for this admission. RD will add supplements and MVI to help pt meet his estimated needs. Per chart, pt appears weight stable since his last admission if admit weight is correct.   Medications reviewed and include: ferrous sulfate, melatonin, solu-medrol, protonix, azithromycin  Labs reviewed: Na 134(L), BUN 39(H), creat 2.20(H), P 4.5 wnl, Mg 1.5(L) Hgb 8.1(L), Hct 23.4(L)  NUTRITION - FOCUSED PHYSICAL EXAM: Unable to perform at this time   Diet Order:   Diet Order             DIET DYS 3 Room service appropriate? Yes; Fluid consistency: Thin  Diet effective now                  EDUCATION NEEDS:   No education needs have been identified at this time  Skin:  Skin Assessment: Reviewed RN Assessment (ecchymosis)  Last BM:  12/23  Height:   Ht Readings  from Last 1 Encounters:  08/10/21 5' 8.5" (1.74 m)    Weight:   Wt Readings from Last 1 Encounters:  08/10/21 104.4 kg    Ideal Body Weight:  71.36 kg  BMI:  Body mass index is 34.49 kg/m.  Estimated Nutritional Needs:   Kcal:  2000-2300kcal/day  Protein:  100-115g/day  Fluid:  1.9-2.2L/day  Koleen Distance MS, RD, LDN Please refer to [facility] for RD and/or RD on-call/weekend/after hours pager

## 2021-08-11 NOTE — Plan of Care (Signed)
Respiratory therapist after being reached out to by day nurse, reported that pt was having fluid on lungs.  He recommended a chest xray. O2 sats on 4L are 95%, but pt is having difficulty breathing. Reached out to on call NP. She put in an order for 80 mg IV lasix. CT of abd yesterday showed R pleural effusion and infiltrates consistent w/bronchopneumonia.  Will continue to monitor.

## 2021-08-11 NOTE — Progress Notes (Signed)
Trevor Antigua, MD [address] [place], [place], [place], [address] [address], [place], [place], [place], [address] Phone: [phone]  Fax: [phone]   Subjective:  Patient was walking out of the bathroom with his nurse today when I entered the room, and was not wearing his oxygen, and was feeling short of breath due to this.  States he just had a bowel movement and it was brown in color.  No abdominal pain, nausea or vomiting.  Daughter at bedside.  Objective: Exam: Vital signs in last 24 hours: Vitals:   08/11/21 0523 08/11/21 0722 08/11/21 0748 08/11/21 1136  BP: (!) 129/59 (!) 137/59  (!) 121/49  Pulse: 95 86  94  Resp: 16 20  15   Temp: 97.6 F (36.4 C) 97.7 F (36.5 C)  97.8 F (36.6 C)  TempSrc: Oral Oral  Oral  SpO2: 93% 100% 97% 98%  Weight:      Height:       Weight change:   Intake/Output Summary (Last 24 hours) at 08/11/2021 1327 Last data filed at 08/11/2021 1033 Gross per 24 hour  Intake 1707.66 ml  Output 600 ml  Net 1107.66 ml    Constitutional: General:   Alert,  Well-developed, well-nourished, pleasant and cooperative in NAD BP (!) 121/49 (BP Location: Left Arm)    Pulse 94    Temp 97.8 F (36.6 C) (Oral)    Resp 15    Ht 5' 8.5" (1.74 m)    Wt 104.4 kg    SpO2 98%    BMI 34.49 kg/m   Eyes:  Sclera clear, no icterus.   Conjunctiva pink.   Ears:  No scars, lesions or masses, Normal auditory acuity. Nose:  No deformity, discharge, or lesions. Mouth:  No deformity or lesions, oropharynx pink & moist.  Neck:  Supple; no masses, trachea midline  Respiratory: Normal respiratory effort  Gastrointestinal:  Soft, non-tender and non-distended without masses, hepatosplenomegaly or hernias noted.  No guarding or rebound tenderness.     Cardiac: No clubbing or edema.  No cyanosis. Normal posterior tibial pedal pulses noted.  Lymphatic:  No significant cervical adenopathy.  Psych:  Alert and cooperative. Normal mood and  affect.  Musculoskeletal:  Head normocephalic, atraumatic. 5/5 Lower extremity strength bilaterally.  Skin: Warm. Intact without significant lesions or rashes. No jaundice.  Neurologic:  Face symmetrical, tongue midline, Normal sensation to touch  Psych:  Alert and oriented x3, Alert and cooperative. Normal mood and affect.   Lab Results: Lab Results  Component Value Date   WBC 5.2 08/11/2021   HGB 8.1 (L) 08/11/2021   HCT 23.4 (L) 08/11/2021   MCV 90.0 08/11/2021   PLT 178 08/11/2021   Micro Results: Recent Results (from the past 240 hour(s))  Resp Panel by RT-PCR (Flu A&B, Covid) Nasopharyngeal Swab     Status: None   Collection Time: 08/10/21  1:33 PM   Specimen: Nasopharyngeal Swab; Nasopharyngeal(NP) swabs in vial transport medium  Result Value Ref Range Status   SARS Coronavirus 2 by RT PCR NEGATIVE NEGATIVE Final    Comment: (NOTE) SARS-CoV-2 target nucleic acids are NOT DETECTED.  The SARS-CoV-2 RNA is generally detectable in upper respiratory specimens during the acute phase of infection. The lowest concentration of SARS-CoV-2 viral copies this assay can detect is 138 copies/mL. A negative result does not preclude SARS-Cov-2 infection and should not be used as the sole basis for treatment or other patient management decisions. A negative result may occur with  improper  specimen collection/handling, submission of specimen other than nasopharyngeal swab, presence of viral mutation(s) within the areas targeted by this assay, and inadequate number of viral copies(<138 copies/mL). A negative result must be combined with clinical observations, patient history, and epidemiological information. The expected result is Negative.  Fact Sheet for Patients:  EntrepreneurPulse.com.au  Fact Sheet for Healthcare Providers:  IncredibleEmployment.be  This test is no t yet approved or cleared by the [place] FDA and  has been authorized  for detection and/or diagnosis of SARS-CoV-2 by FDA under an Emergency Use Authorization (EUA). This EUA will remain  in effect (meaning this test can be used) for the duration of the COVID-19 declaration under Section 564(b)(1) of the Act, 21 U.S.C.section 360bbb-3(b)(1), unless the authorization is terminated  or revoked sooner.       Influenza A by PCR NEGATIVE NEGATIVE Final   Influenza B by PCR NEGATIVE NEGATIVE Final    Comment: (NOTE) The Xpert Xpress SARS-CoV-2/FLU/RSV plus assay is intended as an aid in the diagnosis of influenza from Nasopharyngeal swab specimens and should not be used as a sole basis for treatment. Nasal washings and aspirates are unacceptable for Xpert Xpress SARS-CoV-2/FLU/RSV testing.  Fact Sheet for Patients: EntrepreneurPulse.com.au  Fact Sheet for Healthcare Providers: IncredibleEmployment.be  This test is not yet approved or cleared by the [place] FDA and has been authorized for detection and/or diagnosis of SARS-CoV-2 by FDA under an Emergency Use Authorization (EUA). This EUA will remain in effect (meaning this test can be used) for the duration of the COVID-19 declaration under Section 564(b)(1) of the Act, 21 U.S.C. section 360bbb-3(b)(1), unless the authorization is terminated or revoked.  Performed at [facility], [address] [place], [place] [address]    Studies/Results: CT ABDOMEN PELVIS WO CONTRAST  Result Date: 08/10/2021 CLINICAL DATA:  Recent perforated diverticulitis. Upper gastrointestinal bleeding. Right-sided abdominal pain. EXAM: CT ABDOMEN AND PELVIS WITHOUT CONTRAST TECHNIQUE: Multidetector CT imaging of the abdomen and pelvis was performed following the standard protocol without IV contrast. COMPARISON:  06/26/2021 FINDINGS: Lower chest: Right effusion is slightly larger with dependent atelectasis in the right lung. Newly seen widespread patchy pulmonary infiltrates  consistent with bronchopneumonia, possibly viral. Consider coronavirus. Small pericardial effusion is present. Hepatobiliary: Liver parenchyma is normal.  No calcified gallstones. Pancreas: Normal Spleen: Normal Adrenals/Urinary Tract: Adrenal glands are normal. No acute renal finding. Vascular calcification in both kidneys. Left renal cyst. No evidence of obstruction or inflammatory change. Bladder is normal. Stomach/Bowel: Stomach appears normal by CT. Previous small bowel anastomosis without complicating feature by CT. There is what appears to be a giant diverticulum arising from the fourth portion of the duodenum, without acute feature. No acute or finding related to the colon. Vascular/Lymphatic: Aortic atherosclerosis. No aneurysm. IVC is normal. Reproductive: Prostate seed implants in place. Other: No free fluid or air. Musculoskeletal: Scoliosis and degenerative change of the spine. IMPRESSION: Right pleural effusion larger than on the previous exam. Dependent atelectasis in the right lower lung. Patchy bilateral pulmonary infiltrates consistent with bronchopneumonia, possibly viral. No distinct explanation for gastrointestinal bleeding. No evidence of complication at the small bowel anastomosis. Giant diverticulum of the distal duodenum without acute feature by CT. Aortic Atherosclerosis (ICD10-I70.0). Electronically Signed   By: Nelson Chimes M.D.   On: 08/10/2021 14:33   DG Chest Portable 1 View  Result Date: 08/10/2021 CLINICAL DATA:  Dyspnea. EXAM: PORTABLE CHEST 1 VIEW COMPARISON:  07/14/2021 FINDINGS: The cardio pericardial silhouette is enlarged. Interstitial markings are diffusely coarsened  with chronic features. Patchy airspace disease again noted left base with increased patchy airspace opacity in the right lower lung today. Possible tiny left effusion. The visualized bony structures of the thorax show no acute abnormality. Telemetry leads overlie the chest. IMPRESSION: Patchy airspace disease  at the bases bilaterally and progressive in the right base since prior study. Features may reflect bibasilar atelectasis or pneumonia. Possible tiny left effusion. Electronically Signed   By: Misty Stanley M.D.   On: 08/10/2021 13:00   Medications:  Scheduled Meds:  amLODipine  5 mg Oral Daily   feeding supplement  237 mL Oral TID BM   ferrous sulfate  325 mg Oral Q breakfast   ipratropium-albuterol  3 mL Nebulization Q6H   melatonin  5 mg Oral QHS   methylPREDNISolone (SOLU-MEDROL) injection  125 mg Intravenous Once   [START ON 08/12/2021] multivitamin with minerals  1 tablet Oral Daily   [START ON 08/14/2021] pantoprazole  40 mg Intravenous Q12H   traZODone  150 mg Oral QHS   Continuous Infusions:  sodium chloride Stopped (08/10/21 1545)   azithromycin 500 mg (08/11/21 1004)   pantoprazole 8 mg/hr (08/11/21 0450)   PRN Meds:.ipratropium-albuterol   Assessment: Principal Problem:   Symptomatic anemia Active Problems:   Acute GI bleeding   Hypoalbuminemia due to protein-calorie malnutrition (HCC)   CKD (chronic kidney disease), stage IV ([place])    Plan: Hemoglobin has stabilized since transfusion yesterday and patient has not had any signs of active GI bleeding.  I discussed endoscopic procedures with patient and his daughter today, which would be done when he is further medically optimized from oxygenation and respiratory standpoint.  However, both patient and daughter clearly state that since his hemoglobin has stabilized they would prefer not to do any endoscopic procedures, unless patient has active bleeding  Risks of not undergoing endoscopic procedures discussed with them in detail including risk of underlying malignancy or lesions, rebleeding risks, and they understand these risks well and prefer not to undergo any procedures unless emergent due to change in clinical symptoms (the risks of symptoms being from an underlying GI malignancy is low given that he had EGD and  colonoscopy in June 2022).  Please page GI with any signs of active GI bleeding (none present at this time)  Follow-up in GI clinic in 3 to 4 weeks after discharge as well  Management for increased O2 requirements and lung findings as per primary team   LOS: 1 day   Trevor Antigua, MD 08/11/2021, 1:27 PM

## 2021-08-11 NOTE — Progress Notes (Signed)
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 It was important to him to connect with a church service on Christmas Eve so we found one online to engage. Pt is longing for home and feeling the absence of his wife this evening. Joining in spiritual conversation and prayer was appreciated.

## 2021-08-11 NOTE — Progress Notes (Signed)
PROGRESS NOTE    Trevor Ferguson  CNO:[id number] DOB: 1926/05/18 DOA: 08/10/2021 PCP: [name], MD   Brief Narrative: Taken from H&P.  Trevor Ferguson is a 87 y.o. male with medical history significant for COPD on 3-4 L of oxygen via [place] 24/7, hypertension, hyperlipidemia, sleep apnea, CKD stage IV and history of stroke who presents to the emergency department due to low hemoglobin on outpatient lab.  Patient complained of increased weakness and tiredness within past week and endorsed dark-colored stools and melena within same time. Patient was recently admitted from 11/3-11/30 due to diverticulitis with perforation which resulted in exploratory laparotomy and small bowel resection and acute hypoxic respiratory failure secondary to atelectasis versus early pneumonia/COPD. Patient was hemodynamically stable on presentation.  Hemoglobin found to be at 5.8, received 2 unit of PRBC with improvement of hemoglobin to 8.1. Chest x-ray showed Patchy airspace disease at the bases bilaterally and progressive in the right base since prior study. Features may reflect bibasilar atelectasis or pneumonia. CT abdomen and pelvis without contrast showed right pleural effusion larger than on the previous exam.  Dependent atelectasis in the right lower lung.  Patchy bilateral pulmonary infiltrates consistent with bronchopneumonia, possibly viral. GI was consulted and per GI note patient had upper endoscopy on 6/11 which was unremarkable, followed by colonoscopy on 6/12 which revealed black stool in the terminal ileum.  Subsequently, underwent video capsule endoscopy on 6/12 which revealed diffuse small bowel diverticulosis and localized areas of oozing in the proximal small bowel.  Subsequently, patient underwent push enteroscopy on 6/14 which revealed a diminutive clean-based erosion in the proximal small bowel.  Less likely the source of anemia. There was some concern of GI bleed but patient and family decided  against any endoscopies at this time and they will follow-up with GI as an outpatient. TOC sent a message to [facility], his current residential community and awaiting their response so we can discharge him back.  Subjective: Patient was seen and examined today.  No new complaints.  Denies any abdominal pain.  Denies any obvious bleeding.  Had a brown-colored bowel movement.  Assessment & Plan:   Principal Problem:   Symptomatic anemia Active Problems:   Acute GI bleeding   Hypoalbuminemia due to protein-calorie malnutrition (HCC)   CKD (chronic kidney disease), stage IV (HCC)  Symptomatic anemia most likely secondary to GI bleed.  Patient with a recent abdominal surgery secondary to perforated diverticulitis.  Might have some trickle bleed as there is no obvious bleeding. Multiple prior endoscopies with only 1 erosive lesion in proximal small intestine which was thought to be is not the cause of his anemia at that time.  Patient and family decided against any more endoscopies.  Hemoglobin improved to 8.2 after getting 2 unit of PRBC. GI is not recommending outpatient follow-up. -Monitor hemoglobin -Switch Protonix infusion with p.o. Protonix -Can be discharged back to facility -[facility]  History of iron deficiency anemia. -Continue home dose of ferrous sulfate -Patient might get benefit from Aranesp -advised patient to discuss with nephrology  COPD.  No concern of exacerbation at this time.  On 4 L of oxygen at home -Continue with as needed bronchodilators -Continue with supplemental oxygen  CKD stage IV.  Creatinine seems stable. -Monitor renal function.  Essential hypertension.  Blood pressure within goal. -Continue home dose of amlodipine  Objective: Vitals:   08/11/21 0748 08/11/21 1136 08/11/21 1434 08/11/21 1444  BP:  (!) 121/49  120/61  Pulse:  94  94  Resp:  15  18  Temp:  97.8 F (36.6 C)  98 F (36.7 C)  TempSrc:  Oral  Oral  SpO2: 97% 98% 97% 95%  Weight:       Height:        Intake/Output Summary (Last 24 hours) at 08/11/2021 1619 Last data filed at 08/11/2021 1423 Gross per 24 hour  Intake 1888.93 ml  Output 800 ml  Net 1088.93 ml   Filed Weights   08/10/21 1121  Weight: 104.4 kg    Examination:  General exam: Appears calm and comfortable  Respiratory system: Clear to auscultation. Respiratory effort normal. Cardiovascular system: S1 & S2 heard, RRR.  Gastrointestinal system: Soft, nontender, nondistended, bowel sounds positive. Central nervous system: Alert and oriented. No focal neurological deficits. Extremities: No edema, no cyanosis, pulses intact and symmetrical. Psychiatry: Judgement and insight appear normal.    DVT prophylaxis: SCDs Code Status: DNR Family Communication: Daughter at bedside Disposition Plan:  Status is: Inpatient  Remains inpatient appropriate because: Severity of illness  Level of care: Med-Surg  All the records are reviewed and case discussed with Care Management/Social Worker. Management plans discussed with the patient, nursing and they are in agreement.  Consultants:  GI  Procedures:  Antimicrobials:   Data Reviewed: I have personally reviewed following labs and imaging studies  CBC: Recent Labs  Lab 08/10/21 1252 08/11/21 0024 08/11/21 0431  WBC 7.1 5.6 5.2  NEUTROABS 6.0  --   --   HGB 5.8* 8.2* 8.1*  HCT 17.2* 23.4* 23.4*  MCV 92.5 89.3 90.0  PLT 166 168 425   Basic Metabolic Panel: Recent Labs  Lab 08/10/21 1252 08/11/21 0024 08/11/21 0431  NA 136  --  134*  K 4.2  --  3.7  CL 99  --  99  CO2 28  --  27  GLUCOSE 109*  --  143*  BUN 38*  --  39*  CREATININE 2.46* 2.38* 2.20*  CALCIUM 8.6*  --  8.3*  MG  --   --  1.5*  PHOS  --   --  4.5   GFR: Estimated Creatinine Clearance: 24.8 mL/min (A) (by C-G formula based on SCr of 2.2 mg/dL (H)). Liver Function Tests: Recent Labs  Lab 08/10/21 1252 08/11/21 0431  AST 19 17  ALT 19 16  ALKPHOS 81 64   BILITOT 0.7 1.6*  PROT 6.5 5.5*  ALBUMIN 3.4* 2.6*   Recent Labs  Lab 08/10/21 1121  LIPASE 57*   No results for input(s): AMMONIA in the last 168 hours. Coagulation Profile: Recent Labs  Lab 08/10/21 1252  INR 1.1   Cardiac Enzymes: No results for input(s): CKTOTAL, CKMB, CKMBINDEX, TROPONINI in the last 168 hours. BNP (last 3 results) No results for input(s): PROBNP in the last 8760 hours. HbA1C: No results for input(s): HGBA1C in the last 72 hours. CBG: No results for input(s): GLUCAP in the last 168 hours. Lipid Profile: No results for input(s): CHOL, HDL, LDLCALC, TRIG, CHOLHDL, LDLDIRECT in the last 72 hours. Thyroid Function Tests: No results for input(s): TSH, T4TOTAL, FREET4, T3FREE, THYROIDAB in the last 72 hours. Anemia Panel: No results for input(s): VITAMINB12, FOLATE, FERRITIN, TIBC, IRON, RETICCTPCT in the last 72 hours. Sepsis Labs: Recent Labs  Lab 08/10/21 1121 08/11/21 0431  PROCALCITON 0.24 0.22    Recent Results (from the past 240 hour(s))  Resp Panel by RT-PCR (Flu A&B, Covid) Nasopharyngeal Swab     Status: None   Collection Time: 08/10/21  1:33 PM   Specimen: Nasopharyngeal Swab; Nasopharyngeal(NP) swabs in vial transport medium  Result Value Ref Range Status   SARS Coronavirus 2 by RT PCR NEGATIVE NEGATIVE Final    Comment: (NOTE) SARS-CoV-2 target nucleic acids are NOT DETECTED.  The SARS-CoV-2 RNA is generally detectable in upper respiratory specimens during the acute phase of infection. The lowest concentration of SARS-CoV-2 viral copies this assay can detect is 138 copies/mL. A negative result does not preclude SARS-Cov-2 infection and should not be used as the sole basis for treatment or other patient management decisions. A negative result may occur with  improper specimen collection/handling, submission of specimen other than nasopharyngeal swab, presence of viral mutation(s) within the areas targeted by this assay, and  inadequate number of viral copies(<138 copies/mL). A negative result must be combined with clinical observations, patient history, and epidemiological information. The expected result is Negative.  Fact Sheet for Patients:  EntrepreneurPulse.com.au  Fact Sheet for Healthcare Providers:  IncredibleEmployment.be  This test is no t yet approved or cleared by the [place] FDA and  has been authorized for detection and/or diagnosis of SARS-CoV-2 by FDA under an Emergency Use Authorization (EUA). This EUA will remain  in effect (meaning this test can be used) for the duration of the COVID-19 declaration under Section 564(b)(1) of the Act, 21 U.S.C.section 360bbb-3(b)(1), unless the authorization is terminated  or revoked sooner.       Influenza A by PCR NEGATIVE NEGATIVE Final   Influenza B by PCR NEGATIVE NEGATIVE Final    Comment: (NOTE) The Xpert Xpress SARS-CoV-2/FLU/RSV plus assay is intended as an aid in the diagnosis of influenza from Nasopharyngeal swab specimens and should not be used as a sole basis for treatment. Nasal washings and aspirates are unacceptable for Xpert Xpress SARS-CoV-2/FLU/RSV testing.  Fact Sheet for Patients: EntrepreneurPulse.com.au  Fact Sheet for Healthcare Providers: IncredibleEmployment.be  This test is not yet approved or cleared by the [place] FDA and has been authorized for detection and/or diagnosis of SARS-CoV-2 by FDA under an Emergency Use Authorization (EUA). This EUA will remain in effect (meaning this test can be used) for the duration of the COVID-19 declaration under Section 564(b)(1) of the Act, 21 U.S.C. section 360bbb-3(b)(1), unless the authorization is terminated or revoked.  Performed at [facility], [address], [place], [place] [address]      Radiology Studies: CT ABDOMEN PELVIS WO CONTRAST  Result Date:  08/10/2021 CLINICAL DATA:  Recent perforated diverticulitis. Upper gastrointestinal bleeding. Right-sided abdominal pain. EXAM: CT ABDOMEN AND PELVIS WITHOUT CONTRAST TECHNIQUE: Multidetector CT imaging of the abdomen and pelvis was performed following the standard protocol without IV contrast. COMPARISON:  06/26/2021 FINDINGS: Lower chest: Right effusion is slightly larger with dependent atelectasis in the right lung. Newly seen widespread patchy pulmonary infiltrates consistent with bronchopneumonia, possibly viral. Consider coronavirus. Small pericardial effusion is present. Hepatobiliary: Liver parenchyma is normal.  No calcified gallstones. Pancreas: Normal Spleen: Normal Adrenals/Urinary Tract: Adrenal glands are normal. No acute renal finding. Vascular calcification in both kidneys. Left renal cyst. No evidence of obstruction or inflammatory change. Bladder is normal. Stomach/Bowel: Stomach appears normal by CT. Previous small bowel anastomosis without complicating feature by CT. There is what appears to be a giant diverticulum arising from the fourth portion of the duodenum, without acute feature. No acute or finding related to the colon. Vascular/Lymphatic: Aortic atherosclerosis. No aneurysm. IVC is normal. Reproductive: Prostate seed implants in place. Other: No free fluid or air. Musculoskeletal: Scoliosis and degenerative  change of the spine. IMPRESSION: Right pleural effusion larger than on the previous exam. Dependent atelectasis in the right lower lung. Patchy bilateral pulmonary infiltrates consistent with bronchopneumonia, possibly viral. No distinct explanation for gastrointestinal bleeding. No evidence of complication at the small bowel anastomosis. Giant diverticulum of the distal duodenum without acute feature by CT. Aortic Atherosclerosis (ICD10-I70.0). Electronically Signed   By: Nelson Chimes M.D.   On: 08/10/2021 14:33   DG Chest Portable 1 View  Result Date: 08/10/2021 CLINICAL DATA:   Dyspnea. EXAM: PORTABLE CHEST 1 VIEW COMPARISON:  07/14/2021 FINDINGS: The cardio pericardial silhouette is enlarged. Interstitial markings are diffusely coarsened with chronic features. Patchy airspace disease again noted left base with increased patchy airspace opacity in the right lower lung today. Possible tiny left effusion. The visualized bony structures of the thorax show no acute abnormality. Telemetry leads overlie the chest. IMPRESSION: Patchy airspace disease at the bases bilaterally and progressive in the right base since prior study. Features may reflect bibasilar atelectasis or pneumonia. Possible tiny left effusion. Electronically Signed   By: Misty Stanley M.D.   On: 08/10/2021 13:00    Scheduled Meds:  amLODipine  5 mg Oral Daily   feeding supplement  237 mL Oral TID BM   ferrous sulfate  325 mg Oral Q breakfast   ipratropium-albuterol  3 mL Nebulization Q6H   melatonin  5 mg Oral QHS   methylPREDNISolone (SOLU-MEDROL) injection  125 mg Intravenous Once   [START ON 08/12/2021] multivitamin with minerals  1 tablet Oral Daily   [START ON 08/14/2021] pantoprazole  40 mg Intravenous Q12H   traZODone  150 mg Oral QHS   Continuous Infusions:  sodium chloride Stopped (08/10/21 1545)   azithromycin 500 mg (08/11/21 1004)   pantoprazole 8 mg/hr (08/11/21 1421)     LOS: 1 day   Time spent: 40 minutes. More than 50% of the time was spent in counseling/coordination of care  Lorella Nimrod, MD [facility]  If 7PM-7AM, please contact night-coverage [email or web address]  08/11/2021, 4:19 PM   This record has been created using Systems analyst. Errors have been sought and corrected,but may not always be located. Such creation errors do not reflect on the standard of care.

## 2021-08-11 NOTE — TOC Progression Note (Signed)
Transition of Care [facility]) - Progression Note    Patient Details  Name: IMRAN NUON MRN: [id number] Date of Birth: 02-14-1926  Transition of [facility]) CM/SW Contact  Izola Price, RN Phone Number: 08/11/2021, 3:56 PM  Clinical Narrative:   Provider notified CM that patient can be discharged back to facility arrived from on 12/23. Patient was at [facility] facility. VM left for [facility] Lawerance Sabal at [facility] @ [phone], with call back number for RN CM regarding when patient is able to return. Updated provider. Simmie Davies RN CM           Expected Discharge Plan and Services                           DME Arranged: N/A DME Agency: NA       HH Arranged: NA HH Agency: NA         Social Determinants of Health (SDOH) Interventions    Readmission Risk Interventions Readmission Risk Prevention Plan 06/23/2021 01/29/2021  Transportation Screening Complete Complete  PCP or Specialist Appt within 3-5 Days Complete Complete  HRI or [place] Complete Complete  Social Work Consult for [place] Planning/Counseling Complete Complete  Palliative Care Screening Not Applicable Not Applicable  Medication Review Press photographer) Complete Complete  Some recent data might be hidden

## 2021-08-12 DIAGNOSIS — D649 Anemia, unspecified: Secondary | ICD-10-CM | POA: Diagnosis not present

## 2021-08-12 LAB — CBC
HCT: 23.6 % — ABNORMAL LOW (ref 39.0–52.0)
Hemoglobin: 8.1 g/dL — ABNORMAL LOW (ref 13.0–17.0)
MCH: 30.9 pg (ref 26.0–34.0)
MCHC: 34.3 g/dL (ref 30.0–36.0)
MCV: 90.1 fL (ref 80.0–100.0)
Platelets: 187 10*3/uL (ref 150–400)
RBC: 2.62 MIL/uL — ABNORMAL LOW (ref 4.22–5.81)
RDW: 16.4 % — ABNORMAL HIGH (ref 11.5–15.5)
WBC: 4.8 10*3/uL (ref 4.0–10.5)
nRBC: 0 % (ref 0.0–0.2)

## 2021-08-12 LAB — PROCALCITONIN: Procalcitonin: 0.31 ng/mL

## 2021-08-12 MED ORDER — LEVOFLOXACIN 750 MG PO TABS
750.0000 mg | ORAL_TABLET | ORAL | Status: DC
  Administered 2021-08-12: 12:00:00 750 mg via ORAL
  Filled 2021-08-12: qty 1

## 2021-08-12 MED ORDER — LEVOFLOXACIN 750 MG PO TABS: 750.0000 mg | ORAL_TABLET | ORAL | Status: AC

## 2021-08-12 NOTE — Consult Note (Signed)
Pharmacy Antibiotic Note  Trevor Ferguson is a 87 y.o. male admitted on 08/10/2021 with pneumonia.  Pharmacy has been consulted for levofloxacin dosing.  Plan:  Levofloxacin 750 mg PO q48h  Height: 5' 8.5" (174 cm) Weight: 104.4 kg (230 lb 2.6 oz) IBW/kg (Calculated) : 69.55  Temp (24hrs), Avg:98.1 F (36.7 C), Min:97.6 F (36.4 C), Max:98.9 F (37.2 C)  Recent Labs  Lab 08/10/21 1252 08/11/21 0024 08/11/21 0431 08/12/21 0400  WBC 7.1 5.6 5.2 4.8  CREATININE 2.46* 2.38* 2.20*  --     Estimated Creatinine Clearance: 24.8 mL/min (A) (by C-G formula based on SCr of 2.2 mg/dL (H)).    Allergies  Allergen Reactions   Penicillins Hives, Rash and Other (See Comments)    Rash on hands and blisters on ankles when 87 years old Patient tolerates Zosyn   Bee Venom Palpitations    INSECT BITES/STINGS   Darvon [Propoxyphene] Nausea Only and Rash    Antimicrobials this admission: Azithromycin 12/24 x 1 Levofloxacin 12/25 >>   Dose adjustments this admission: N/A  Microbiology results:  Thank you for allowing pharmacy to be a part of this patients care.  Benita Gutter 08/12/2021 12:55 PM

## 2021-08-12 NOTE — TOC Transition Note (Signed)
Transition of Care [facility]) - CM/SW Discharge Note   Patient Details  Name: ALMIR BOTTS MRN: [id number] Date of Birth: 1926/07/11  Transition of Care [facility]) CM/SW Contact:  Izola Price, RN Phone Number: 08/12/2021, 1:42 PM   Clinical Narrative:   Patient is discharging from [facility] and returning to [place] at [facility]. Verified with Lawerance Sabal at [facility]. FL2 and DC summary inboxed via SNF Hub. EMS forms printed to unit and [facility] contacted for transportation. Patient is going to room 502. Report called to [phone] by unit RN. Simmie Davies RN CM     Final next level of care: [place] ([facility]) (Returning to [facility]) Barriers to Discharge: Barriers Unresolved (comment) (Facility not returning phone calls for readmission. Holiday Weekend. Left VM with [facility])   Patient Goals and CMS Choice        Discharge Placement              Patient chooses bed at: [facility] (will return to [facility] on discharge.)        Discharge Plan and Services                DME Arranged: N/A DME Agency: NA       HH Arranged: NA HH Agency: NA        Social Determinants of Health (SDOH) Interventions     Readmission Risk Interventions Readmission Risk Prevention Plan 06/23/2021 01/29/2021  Transportation Screening Complete Complete  PCP or Specialist Appt within 3-5 Days Complete Complete  HRI or [place] Complete Complete  Social Work Consult for [place] Planning/Counseling Complete Complete  Palliative Care Screening Not Applicable Not Applicable  Medication Review Press photographer) Complete Complete  Some recent data might be hidden

## 2021-08-12 NOTE — Discharge Summary (Addendum)
Physician Discharge Summary  Trevor Ferguson:[id number] DOB: 1926/05/14 DOA: 08/10/2021  PCP: [name], MD  Admit date: 08/10/2021 Discharge date: 08/12/2021  Admitted From: LTC Disposition: LTC  Recommendations for Outpatient Follow-up:  Follow up with PCP in 1-2 weeks Follow-up with gastroenterology Please obtain BMP/CBC in one week Please follow up on the following pending results: None  Home Health: No Equipment/Devices: [place], home oxygen on 4 L Discharge Condition: Stable CODE STATUS: DNR Diet recommendation: Heart Healthy    Brief/Interim Summary: Trevor Ferguson is a 87 y.o. male with medical history significant for COPD on 3-4 L of oxygen via [place] 24/7, hypertension, hyperlipidemia, sleep apnea, CKD stage IV and history of stroke who presents to the emergency department due to low hemoglobin on outpatient lab.  Patient complained of increased weakness and tiredness within past week and endorsed dark-colored stools and melena within same time. Patient was recently admitted from 11/3-11/30 due to diverticulitis with perforation which resulted in exploratory laparotomy and small bowel resection and acute hypoxic respiratory failure secondary to atelectasis versus early pneumonia/COPD. Patient was hemodynamically stable on presentation.  Hemoglobin found to be at 5.8, received 2 unit of PRBC with improvement of hemoglobin to 8.1.  And remained stable. GI was consulted and per GI note patient had upper endoscopy on 6/11 which was unremarkable, followed by colonoscopy on 6/12 which revealed black stool in the terminal ileum.  Subsequently, underwent video capsule endoscopy on 6/12 which revealed diffuse small bowel diverticulosis and localized areas of oozing in the proximal small bowel.  Subsequently, patient underwent push enteroscopy on 6/14 which revealed a diminutive clean-based erosion in the proximal small bowel.  Less likely the source of anemia. There was  some concern of GI bleed but patient and family decided against any endoscopies at this time and they will follow-up with GI as an outpatient.  Patient will continue home iron supplement and will need monitoring of his hemoglobin.  Chest x-ray showed Patchy airspace disease at the bases bilaterally and progressive in the right base since prior study. Features may reflect bibasilar atelectasis or pneumonia. CT abdomen and pelvis without contrast showed right pleural effusion larger than on the previous exam.  Dependent atelectasis in the right lower lung.  Patchy bilateral pulmonary infiltrates consistent with bronchopneumonia, possibly viral.  Procalcitonin was elevated at 0.313 was given Levaquin for 7 days.  He will take it every 48 hours, dose based on his renal function.  Remained on 4 L of oxygen which is his baseline.  Patient has an history of stage IV CKD, creatinine seems stable and at his baseline. Magnesium was at 1.5 which was repleted before discharge.  He will continue rest of his medications and follow-up with his providers.  Discharge Diagnoses:  Principal Problem:   Symptomatic anemia Active Problems:   Acute GI bleeding   Hypoalbuminemia due to protein-calorie malnutrition (HCC)   CKD (chronic kidney disease), stage IV [facility])   Discharge Instructions  Discharge Instructions     Diet - low sodium heart healthy   Complete by: As directed    Increase activity slowly   Complete by: As directed       Allergies as of 08/12/2021       Reactions   Penicillins Hives, Rash, Other (See Comments)   Rash on hands and blisters on ankles when 87 years old Patient tolerates Zosyn   Bee Venom Palpitations   INSECT BITES/STINGS   Darvon [propoxyphene] Nausea Only, Rash  Medication List     STOP taking these medications    SM Double Antibiotic 500-10000 UNIT/GM Oint   sodium chloride 0.65 % Soln nasal spray Commonly known as: OCEAN   umeclidinium-vilanterol  62.5-25 MCG/ACT Aepb Commonly known as: ANORO ELLIPTA       TAKE these medications    acetaminophen 325 MG tablet Commonly known as: TYLENOL Take 2 tablets (650 mg total) by mouth every 6 (six) hours as needed for mild pain (or Fever >/= 101).   acidophilus Caps capsule Take 2 capsules by mouth 3 (three) times daily.   albuterol 108 (90 Base) MCG/ACT inhaler Commonly known as: VENTOLIN HFA Inhale 1-2 puffs into the lungs every 6 (six) hours as needed for wheezing or shortness of breath.   albuterol 0.63 MG/3ML nebulizer solution Commonly known as: ACCUNEB 3 (three) times daily as needed.   amLODipine 5 MG tablet Commonly known as: NORVASC Take 1 tablet (5 mg total) by mouth daily.   budesonide 0.5 MG/2ML nebulizer solution Commonly known as: PULMICORT Take 0.5 mg by nebulization daily.   escitalopram 10 MG tablet Commonly known as: LEXAPRO Take 10 mg by mouth daily.   famotidine 10 MG tablet Commonly known as: PEPCID Take 1 tablet (10 mg total) by mouth 2 (two) times daily.   feeding supplement Liqd Take 237 mLs by mouth 2 (two) times daily between meals.   ferrous sulfate 325 (65 FE) MG tablet Take 1 tablet (325 mg total) by mouth daily with breakfast.   fluticasone 50 MCG/ACT nasal spray Commonly known as: FLONASE Place 2 sprays into both nostrils daily.   fluticasone-salmeterol 250-50 MCG/ACT Aepb Commonly known as: ADVAIR Inhale 1 puff into the lungs 2 (two) times daily.   levofloxacin 750 MG tablet Commonly known as: LEVAQUIN Take 1 tablet (750 mg total) by mouth every other day for 7 days. Start taking on: August 14, 2021   loperamide 2 MG capsule Commonly known as: IMODIUM Take 1 capsule (2 mg total) by mouth as needed for diarrhea or loose stools.   loratadine 10 MG tablet Commonly known as: CLARITIN Take 10 mg by mouth daily.   melatonin 5 MG Tabs Take 5 mg by mouth at bedtime.   omeprazole 20 MG capsule Commonly known as:  PRILOSEC Take 20 mg by mouth daily.   ondansetron 4 MG tablet Commonly known as: ZOFRAN Take 4 mg by mouth every 8 (eight) hours as needed for nausea or vomiting.   oxyCODONE-acetaminophen 5-325 MG tablet Commonly known as: PERCOCET/ROXICET Take 1 tablet by mouth every 6 (six) hours as needed for moderate pain.   simethicone 80 MG chewable tablet Commonly known as: MYLICON Chew 1 tablet (80 mg total) by mouth 4 (four) times daily as needed for flatulence.   Torsemide 60 MG Tabs Take 60 mg by mouth daily.   traZODone 150 MG tablet Commonly known as: DESYREL Take 150 mg by mouth at bedtime.   triamcinolone cream 0.1 % Commonly known as: KENALOG Apply 1 application topically 2 (two) times daily.   Vitamin D-3 25 MCG (1000 UT) Caps Take 1,000 Units by mouth daily.        Allergies  Allergen Reactions   Penicillins Hives, Rash and Other (See Comments)    Rash on hands and blisters on ankles when 87 years old Patient tolerates Zosyn   Bee Venom Palpitations    INSECT BITES/STINGS   Darvon [Propoxyphene] Nausea Only and Rash    Consultations: Gastroenterology  Procedures/Studies: CT ABDOMEN PELVIS WO  CONTRAST  Result Date: 08/10/2021 CLINICAL DATA:  Recent perforated diverticulitis. Upper gastrointestinal bleeding. Right-sided abdominal pain. EXAM: CT ABDOMEN AND PELVIS WITHOUT CONTRAST TECHNIQUE: Multidetector CT imaging of the abdomen and pelvis was performed following the standard protocol without IV contrast. COMPARISON:  06/26/2021 FINDINGS: Lower chest: Right effusion is slightly larger with dependent atelectasis in the right lung. Newly seen widespread patchy pulmonary infiltrates consistent with bronchopneumonia, possibly viral. Consider coronavirus. Small pericardial effusion is present. Hepatobiliary: Liver parenchyma is normal.  No calcified gallstones. Pancreas: Normal Spleen: Normal Adrenals/Urinary Tract: Adrenal glands are normal. No acute renal finding.  Vascular calcification in both kidneys. Left renal cyst. No evidence of obstruction or inflammatory change. Bladder is normal. Stomach/Bowel: Stomach appears normal by CT. Previous small bowel anastomosis without complicating feature by CT. There is what appears to be a giant diverticulum arising from the fourth portion of the duodenum, without acute feature. No acute or finding related to the colon. Vascular/Lymphatic: Aortic atherosclerosis. No aneurysm. IVC is normal. Reproductive: Prostate seed implants in place. Other: No free fluid or air. Musculoskeletal: Scoliosis and degenerative change of the spine. IMPRESSION: Right pleural effusion larger than on the previous exam. Dependent atelectasis in the right lower lung. Patchy bilateral pulmonary infiltrates consistent with bronchopneumonia, possibly viral. No distinct explanation for gastrointestinal bleeding. No evidence of complication at the small bowel anastomosis. Giant diverticulum of the distal duodenum without acute feature by CT. Aortic Atherosclerosis (ICD10-I70.0). Electronically Signed   By: Nelson Chimes M.D.   On: 08/10/2021 14:33   DG Chest Portable 1 View  Result Date: 08/10/2021 CLINICAL DATA:  Dyspnea. EXAM: PORTABLE CHEST 1 VIEW COMPARISON:  07/14/2021 FINDINGS: The cardio pericardial silhouette is enlarged. Interstitial markings are diffusely coarsened with chronic features. Patchy airspace disease again noted left base with increased patchy airspace opacity in the right lower lung today. Possible tiny left effusion. The visualized bony structures of the thorax show no acute abnormality. Telemetry leads overlie the chest. IMPRESSION: Patchy airspace disease at the bases bilaterally and progressive in the right base since prior study. Features may reflect bibasilar atelectasis or pneumonia. Possible tiny left effusion. Electronically Signed   By: Misty Stanley M.D.   On: 08/10/2021 13:00   DG Chest Port 1 View  Result Date:  07/14/2021 CLINICAL DATA:  Difficulty breathing EXAM: PORTABLE CHEST 1 VIEW COMPARISON:  Previous studies including the examination of 07/12/2021 FINDINGS: Transverse diameter of heart is increased. Central pulmonary vessels are more prominent. Increased interstitial markings are seen in parahilar regions with interval worsening. There are linear densities and increased interstitial markings in both lower lung fields with interval worsening. There is blunting of left lateral CP angle. There is no pneumothorax. IMPRESSION: Increased interstitial markings are seen in the parahilar regions and lower lung fields with interval worsening suggesting pulmonary edema or bilateral pneumonia. Possible small left pleural effusion. Electronically Signed   By: Elmer Picker M.D.   On: 07/14/2021 15:30   ECHOCARDIOGRAM COMPLETE  Result Date: 07/16/2021    ECHOCARDIOGRAM REPORT   Patient Name:   EMILIO BAYLOCK Date of Exam: 07/16/2021 Medical Rec #:  [id number]       Height:       68.5 in Accession #:    [id number]      Weight:       230.2 lb Date of Birth:  1925/08/24       BSA:          2.181 m Patient Age:    87 years  BP:           122/70 mmHg Patient Gender: M               HR:           85 bpm. Exam Location:  [facility] Procedure: 2D Echo, Cardiac Doppler and Color Doppler Indications:     Dyspnea R06.00  History:         Patient has prior history of Echocardiogram examinations, most                  recent 03/02/2020. COPD and Stroke; Risk Factors:Hypertension                  and Sleep Apnea. Pneumonia.  Sonographer:     Sherrie Sport Referring Phys:  [place] Diagnosing Phys: Kathlyn Sacramento MD  Sonographer Comments: Suboptimal apical window. IMPRESSIONS  1. Left ventricular ejection fraction, by estimation, is 60 to 65%. The left ventricle has normal function. The left ventricle has no regional wall motion abnormalities. There is mild left ventricular hypertrophy. Left ventricular diastolic parameters  are indeterminate.  2. Right ventricular systolic function is normal. The right ventricular size is normal.  3. Left atrial size was mild to moderately dilated.  4. Right atrial size was mild to moderately dilated.  5. The mitral valve is normal in structure. No evidence of mitral valve regurgitation. No evidence of mitral stenosis.  6. The aortic valve is normal in structure. Aortic valve regurgitation is not visualized. Aortic valve sclerosis/calcification is present, without any evidence of aortic stenosis. FINDINGS  Left Ventricle: Left ventricular ejection fraction, by estimation, is 60 to 65%. The left ventricle has normal function. The left ventricle has no regional wall motion abnormalities. The left ventricular internal cavity size was normal in size. There is  mild left ventricular hypertrophy. Left ventricular diastolic parameters are indeterminate. Right Ventricle: The right ventricular size is normal. No increase in right ventricular wall thickness. Right ventricular systolic function is normal. Left Atrium: Left atrial size was mild to moderately dilated. Right Atrium: Right atrial size was mild to moderately dilated. Pericardium: There is no evidence of pericardial effusion. Mitral Valve: The mitral valve is normal in structure. No evidence of mitral valve regurgitation. No evidence of mitral valve stenosis. MV peak gradient, 6.4 mmHg. The mean mitral valve gradient is 3.0 mmHg. Tricuspid Valve: The tricuspid valve is normal in structure. Tricuspid valve regurgitation is trivial. No evidence of tricuspid stenosis. Aortic Valve: The aortic valve is normal in structure. Aortic valve regurgitation is not visualized. Aortic valve sclerosis/calcification is present, without any evidence of aortic stenosis. Aortic valve mean gradient measures 6.0 mmHg. Aortic valve peak  gradient measures 11.3 mmHg. Aortic valve area, by VTI measures 1.33 cm. Pulmonic Valve: The pulmonic valve was normal in structure.  Pulmonic valve regurgitation is not visualized. No evidence of pulmonic stenosis. Aorta: The aortic root is normal in size and structure. Venous: The inferior vena cava was not well visualized. IAS/Shunts: No atrial level shunt detected by color flow Doppler.  LEFT VENTRICLE PLAX 2D LVIDd:         4.40 cm   Diastology LVIDs:         2.80 cm   LV e' medial:    7.62 cm/s LV PW:         1.30 cm   LV E/e' medial:  12.0 LV IVS:        1.05 cm   LV e' lateral:  7.07 cm/s LVOT diam:     2.00 cm   LV E/e' lateral: 12.9 LV SV:         41 LV SV Index:   19 LVOT Area:     3.14 cm  RIGHT VENTRICLE RV Basal diam:  4.30 cm RV S prime:     19.90 cm/s TAPSE (M-mode): 5.3 cm LEFT ATRIUM             Index        RIGHT ATRIUM           Index LA diam:        3.70 cm 1.70 cm/m   RA Area:     28.50 cm LA Vol (A2C):   98.9 ml 45.34 ml/m  RA Volume:   94.80 ml  43.46 ml/m LA Vol (A4C):   79.3 ml 36.35 ml/m LA Biplane Vol: 93.6 ml 42.91 ml/m  AORTIC VALVE                     PULMONIC VALVE AV Area (Vmax):    1.24 cm      PV Vmax:        1.02 m/s AV Area (Vmean):   1.26 cm      PV Vmean:       72.450 cm/s AV Area (VTI):     1.33 cm      PV VTI:         0.228 m AV Vmax:           168.33 cm/s   PV Peak grad:   4.2 mmHg AV Vmean:          107.567 cm/s  PV Mean grad:   2.0 mmHg AV VTI:            0.308 m       RVOT Peak grad: 6 mmHg AV Peak Grad:      11.3 mmHg AV Mean Grad:      6.0 mmHg LVOT Vmax:         66.60 cm/s LVOT Vmean:        43.100 cm/s LVOT VTI:          0.131 m LVOT/AV VTI ratio: 0.42  AORTA Ao Root diam: 3.17 cm MITRAL VALVE                TRICUSPID VALVE MV Area (PHT): 2.16 cm     TR Peak grad:   22.8 mmHg MV Area VTI:   0.86 cm     TR Vmax:        239.00 cm/s MV Peak grad:  6.4 mmHg MV Mean grad:  3.0 mmHg     SHUNTS MV Vmax:       1.26 m/s     Systemic VTI:  0.13 m MV Vmean:      75.4 cm/s    Systemic Diam: 2.00 cm MV Decel Time: 351 msec     Pulmonic VTI:  0.220 m MV E velocity: 91.30 cm/s MV A velocity: 107.00  cm/s MV E/A ratio:  0.85 Kathlyn Sacramento MD Electronically signed by Kathlyn Sacramento MD Signature Date/Time: 07/16/2021/4:10:42 PM    Final     Subjective: Patient was seen and examined today.  Denies any new complaints.  No obvious bleeding.  Hemoglobin stable.  He wants to go back to his facility to celebrate Christmas with his friends.  Discharge Exam: Vitals:   08/12/21 0737 08/12/21 0829  BP:  (!) 129/57  Pulse:  91  Resp:  19  Temp:  97.6 F (36.4 C)  SpO2: 98% 97%   Vitals:   08/12/21 0135 08/12/21 0615 08/12/21 0737 08/12/21 0829  BP: (!) 119/58 115/80  (!) 129/57  Pulse: 92 91  91  Resp: 18 18  19   Temp: 97.8 F (36.6 C) 97.8 F (36.6 C)  97.6 F (36.4 C)  TempSrc: Oral Oral  Oral  SpO2: 95% 95% 98% 97%  Weight:      Height:        General: Pt is alert, awake, not in acute distress Cardiovascular: RRR, S1/S2 +, no rubs, no gallops Respiratory: CTA bilaterally, no wheezing, no rhonchi Abdominal: Soft, NT, ND, bowel sounds + Extremities: no edema, no cyanosis   The results of significant diagnostics from this hospitalization (including imaging, microbiology, ancillary and laboratory) are listed below for reference.    Microbiology: Recent Results (from the past 240 hour(s))  Resp Panel by RT-PCR (Flu A&B, Covid) Nasopharyngeal Swab     Status: None   Collection Time: 08/10/21  1:33 PM   Specimen: Nasopharyngeal Swab; Nasopharyngeal(NP) swabs in vial transport medium  Result Value Ref Range Status   SARS Coronavirus 2 by RT PCR NEGATIVE NEGATIVE Final    Comment: (NOTE) SARS-CoV-2 target nucleic acids are NOT DETECTED.  The SARS-CoV-2 RNA is generally detectable in upper respiratory specimens during the acute phase of infection. The lowest concentration of SARS-CoV-2 viral copies this assay can detect is 138 copies/mL. A negative result does not preclude SARS-Cov-2 infection and should not be used as the sole basis for treatment or other patient management  decisions. A negative result may occur with  improper specimen collection/handling, submission of specimen other than nasopharyngeal swab, presence of viral mutation(s) within the areas targeted by this assay, and inadequate number of viral copies(<138 copies/mL). A negative result must be combined with clinical observations, patient history, and epidemiological information. The expected result is Negative.  Fact Sheet for Patients:  EntrepreneurPulse.com.au  Fact Sheet for Healthcare Providers:  IncredibleEmployment.be  This test is no t yet approved or cleared by the [place] FDA and  has been authorized for detection and/or diagnosis of SARS-CoV-2 by FDA under an Emergency Use Authorization (EUA). This EUA will remain  in effect (meaning this test can be used) for the duration of the COVID-19 declaration under Section 564(b)(1) of the Act, 21 U.S.C.section 360bbb-3(b)(1), unless the authorization is terminated  or revoked sooner.       Influenza A by PCR NEGATIVE NEGATIVE Final   Influenza B by PCR NEGATIVE NEGATIVE Final    Comment: (NOTE) The Xpert Xpress SARS-CoV-2/FLU/RSV plus assay is intended as an aid in the diagnosis of influenza from Nasopharyngeal swab specimens and should not be used as a sole basis for treatment. Nasal washings and aspirates are unacceptable for Xpert Xpress SARS-CoV-2/FLU/RSV testing.  Fact Sheet for Patients: EntrepreneurPulse.com.au  Fact Sheet for Healthcare Providers: IncredibleEmployment.be  This test is not yet approved or cleared by the [place] FDA and has been authorized for detection and/or diagnosis of SARS-CoV-2 by FDA under an Emergency Use Authorization (EUA). This EUA will remain in effect (meaning this test can be used) for the duration of the COVID-19 declaration under Section 564(b)(1) of the Act, 21 U.S.C. section 360bbb-3(b)(1), unless the  authorization is terminated or revoked.  Performed at [facility], [address] [place], [place] [address]      Labs: BNP (last 3 results) Recent Labs  07/05/21 1753  BNP 025.8*   Basic Metabolic Panel: Recent Labs  Lab 08/10/21 1252 08/11/21 0024 08/11/21 0431  NA 136  --  134*  K 4.2  --  3.7  CL 99  --  99  CO2 28  --  27  GLUCOSE 109*  --  143*  BUN 38*  --  39*  CREATININE 2.46* 2.38* 2.20*  CALCIUM 8.6*  --  8.3*  MG  --   --  1.5*  PHOS  --   --  4.5   Liver Function Tests: Recent Labs  Lab 08/10/21 1252 08/11/21 0431  AST 19 17  ALT 19 16  ALKPHOS 81 64  BILITOT 0.7 1.6*  PROT 6.5 5.5*  ALBUMIN 3.4* 2.6*   Recent Labs  Lab 08/10/21 1121  LIPASE 57*   No results for input(s): AMMONIA in the last 168 hours. CBC: Recent Labs  Lab 08/10/21 1252 08/11/21 0024 08/11/21 0431 08/12/21 0400  WBC 7.1 5.6 5.2 4.8  NEUTROABS 6.0  --   --   --   HGB 5.8* 8.2* 8.1* 8.1*  HCT 17.2* 23.4* 23.4* 23.6*  MCV 92.5 89.3 90.0 90.1  PLT 166 168 178 187   Cardiac Enzymes: No results for input(s): CKTOTAL, CKMB, CKMBINDEX, TROPONINI in the last 168 hours. BNP: Invalid input(s): POCBNP CBG: No results for input(s): GLUCAP in the last 168 hours. D-Dimer No results for input(s): DDIMER in the last 72 hours. Hgb A1c No results for input(s): HGBA1C in the last 72 hours. Lipid Profile No results for input(s): CHOL, HDL, LDLCALC, TRIG, CHOLHDL, LDLDIRECT in the last 72 hours. Thyroid function studies No results for input(s): TSH, T4TOTAL, T3FREE, THYROIDAB in the last 72 hours.  Invalid input(s): FREET3 Anemia work up No results for input(s): VITAMINB12, FOLATE, FERRITIN, TIBC, IRON, RETICCTPCT in the last 72 hours. Urinalysis    Component Value Date/Time   COLORURINE AMBER (A) 06/25/2021 1545   APPEARANCEUR HAZY (A) 06/25/2021 1545   APPEARANCEUR Clear 03/19/2014 1049   LABSPEC 1.018 06/25/2021 1545   LABSPEC 1.016 03/19/2014 1049    PHURINE 5.0 06/25/2021 1545   GLUCOSEU NEGATIVE 06/25/2021 1545   GLUCOSEU Negative 03/19/2014 1049   HGBUR NEGATIVE 06/25/2021 1545   BILIRUBINUR NEGATIVE 06/25/2021 1545   BILIRUBINUR Negative 03/19/2014 1049   KETONESUR 5 (A) 06/25/2021 1545   PROTEINUR 30 (A) 06/25/2021 1545   NITRITE NEGATIVE 06/25/2021 1545   LEUKOCYTESUR TRACE (A) 06/25/2021 1545   LEUKOCYTESUR Negative 03/19/2014 1049   Sepsis Labs Invalid input(s): PROCALCITONIN,  WBC,  LACTICIDVEN Microbiology Recent Results (from the past 240 hour(s))  Resp Panel by RT-PCR (Flu A&B, Covid) Nasopharyngeal Swab     Status: None   Collection Time: 08/10/21  1:33 PM   Specimen: Nasopharyngeal Swab; Nasopharyngeal(NP) swabs in vial transport medium  Result Value Ref Range Status   SARS Coronavirus 2 by RT PCR NEGATIVE NEGATIVE Final    Comment: (NOTE) SARS-CoV-2 target nucleic acids are NOT DETECTED.  The SARS-CoV-2 RNA is generally detectable in upper respiratory specimens during the acute phase of infection. The lowest concentration of SARS-CoV-2 viral copies this assay can detect is 138 copies/mL. A negative result does not preclude SARS-Cov-2 infection and should not be used as the sole basis for treatment or other patient management decisions. A negative result may occur with  improper specimen collection/handling, submission of specimen other than nasopharyngeal swab, presence of viral mutation(s) within the areas targeted by this assay, and inadequate number of viral copies(<138 copies/mL). A  negative result must be combined with clinical observations, patient history, and epidemiological information. The expected result is Negative.  Fact Sheet for Patients:  EntrepreneurPulse.com.au  Fact Sheet for Healthcare Providers:  IncredibleEmployment.be  This test is no t yet approved or cleared by the [place] FDA and  has been authorized for detection and/or diagnosis of  SARS-CoV-2 by FDA under an Emergency Use Authorization (EUA). This EUA will remain  in effect (meaning this test can be used) for the duration of the COVID-19 declaration under Section 564(b)(1) of the Act, 21 U.S.C.section 360bbb-3(b)(1), unless the authorization is terminated  or revoked sooner.       Influenza A by PCR NEGATIVE NEGATIVE Final   Influenza B by PCR NEGATIVE NEGATIVE Final    Comment: (NOTE) The Xpert Xpress SARS-CoV-2/FLU/RSV plus assay is intended as an aid in the diagnosis of influenza from Nasopharyngeal swab specimens and should not be used as a sole basis for treatment. Nasal washings and aspirates are unacceptable for Xpert Xpress SARS-CoV-2/FLU/RSV testing.  Fact Sheet for Patients: EntrepreneurPulse.com.au  Fact Sheet for Healthcare Providers: IncredibleEmployment.be  This test is not yet approved or cleared by the [place] FDA and has been authorized for detection and/or diagnosis of SARS-CoV-2 by FDA under an Emergency Use Authorization (EUA). This EUA will remain in effect (meaning this test can be used) for the duration of the COVID-19 declaration under Section 564(b)(1) of the Act, 21 U.S.C. section 360bbb-3(b)(1), unless the authorization is terminated or revoked.  Performed at [facility], [address] [place], [place] [address]     Time coordinating discharge: Over 30 minutes  SIGNED:  Lorella Nimrod, MD  [facility] 08/12/2021, 11:43 AM  If 7PM-7AM, please contact night-coverage [email or web address]  This record has been created using Systems analyst. Errors have been sought and corrected,but may not always be located. Such creation errors do not reflect on the standard of care.

## 2021-08-12 NOTE — NC FL2 (Signed)
[place] LEVEL OF CARE SCREENING TOOL     IDENTIFICATION  Patient Name: Trevor Ferguson Birthdate: [birth year] Sex: male Admission Date (Current Location): 08/10/2021  [place] and [place] Number:  Engineering geologist and Address:  [facility], [address] [place], [place] [address]      Provider Number: [id number]  Attending Physician Name and Address:  Lorella Nimrod, MD  Relative Name and Phone Number:  Virginia Crews (Daughter)   [phone] [facility])    Current Level of Care: Hospital Recommended Level of Care: Other (Comment) ([place] resident. ROOM [phone].) Prior Approval Number:    Date Approved/Denied: 02/21/11 PASRR Number: [id number]  Discharge Plan: Other (Comment) (LTC at [facility])    Current Diagnoses: Patient Active Problem List   Diagnosis Date Noted   Symptomatic anemia 08/10/2021   Hypoalbuminemia due to protein-calorie malnutrition ([place]) 08/10/2021   CKD (chronic kidney disease), stage IV ([place]) 08/10/2021   Pulmonary edema    Acute on chronic respiratory failure with hypoxia (HCC)    Acute diastolic congestive heart failure (HCC)    Dyspnea    Pressure injury of skin 07/01/2021   Abdominal distension (gaseous)    AKI (acute kidney injury) ([place])    Diverticulitis    Hypoxia    Perforation of intestine due to diverticulitis of gastrointestinal tract    HAP (hospital-acquired pneumonia)    Acute GI bleeding    Polyp of sigmoid colon    Rectal bleeding    GI bleed 01/26/2021   Benign hypertensive kidney disease with chronic kidney disease 01/08/2021   Hemorrhoids    Rash    Depression    Weakness    Abdominal pain 03/06/2020   Acute kidney injury superimposed on CKD ([place]) 03/06/2020   Anemia 03/06/2020   Acute diverticulitis 03/01/2020   Pneumonia 07/17/2018   Leg swelling 12/18/2015   Chronic diastolic CHF (congestive heart failure) ([place]) 07/18/2015   Essential hypertension  07/18/2015   Prostate cancer ([place]) 06/02/2015   COPD with acute exacerbation ([place]) 03/28/2015   Sleep apnea 03/28/2015   SOB (shortness of breath) 03/28/2015   Personal history of transient ischemic attack (TIA), and cerebral infarction without residual deficits 09/21/2014    Orientation RESPIRATION BLADDER Height & Weight     Self, Time, Situation, Place  O2 Continent Weight: 104.4 kg Height:  5' 8.5" (174 cm)  BEHAVIORAL SYMPTOMS/MOOD NEUROLOGICAL BOWEL NUTRITION STATUS      Continent Diet  AMBULATORY STATUS COMMUNICATION OF NEEDS Skin   Extensive Assist Verbally                         Personal Care Assistance Level of Assistance  Bathing, Feeding, Dressing Bathing Assistance: Maximum assistance Feeding assistance: Limited assistance Dressing Assistance: Maximum assistance     Functional Limitations Info             SPECIAL CARE FACTORS FREQUENCY                       Contractures Contractures Info: Not present    Additional Factors Info  Code Status, Allergies Code Status Info: DNR Allergies Info: Bee Venom, Darvon, Penicillins: See chart for more details on reactions to each.           Current Medications (08/12/2021):  This is the current hospital active medication list Current Facility-Administered Medications  Medication Dose Route Frequency Provider Last Rate Last Admin   0.9 %  sodium chloride infusion  10 mL/hr Intravenous Once Adefeso, Oladapo, DO   Held at 08/10/21 1545   amLODipine (NORVASC) tablet 5 mg  5 mg Oral Daily Adefeso, Oladapo, DO   5 mg at 08/12/21 0835   feeding supplement (ENSURE ENLIVE / ENSURE PLUS) liquid 237 mL  237 mL Oral TID BM Lorella Nimrod, MD   237 mL at 08/12/21 0836   ferrous sulfate tablet 325 mg  325 mg Oral Q breakfast Adefeso, Oladapo, DO   325 mg at 08/12/21 0835   ipratropium-albuterol (DUONEB) 0.5-2.5 (3) MG/3ML nebulizer solution 3 mL  3 mL Nebulization Q6H Adefeso, Oladapo, DO   3 mL at 08/12/21 0737    ipratropium-albuterol (DUONEB) 0.5-2.5 (3) MG/3ML nebulizer solution 3 mL  3 mL Nebulization Q4H PRN Adefeso, Oladapo, DO       levofloxacin (LEVAQUIN) tablet 750 mg  750 mg Oral Q48H Lockie Mola B, RPH   750 mg at 08/12/21 1130   melatonin tablet 5 mg  5 mg Oral QHS Adefeso, Oladapo, DO   5 mg at 08/11/21 2026   methylPREDNISolone sodium succinate (SOLU-MEDROL) 125 mg/2 mL injection 125 mg  125 mg Intravenous Once Adefeso, Oladapo, DO       multivitamin with minerals tablet 1 tablet  1 tablet Oral Daily Lorella Nimrod, MD   1 tablet at 08/12/21 1130   pantoprazole (PROTONIX) EC tablet 40 mg  40 mg Oral BID Lorella Nimrod, MD   40 mg at 08/12/21 0835   [START ON 08/14/2021] pantoprazole (PROTONIX) injection 40 mg  40 mg Intravenous Q12H Adefeso, Oladapo, DO       traZODone (DESYREL) tablet 150 mg  150 mg Oral QHS Adefeso, Oladapo, DO   150 mg at 08/11/21 2026     Discharge Medications: Please see discharge summary for a list of discharge medications.  Relevant Imaging Results:  Relevant Lab Results:   Additional Information SS#: [id number]  Room 502.  Izola Price, RN

## 2021-08-13 ENCOUNTER — Encounter: Payer: Self-pay | Admitting: Gastroenterology

## 2021-08-13 DIAGNOSIS — D62 Acute posthemorrhagic anemia: Secondary | ICD-10-CM | POA: Diagnosis not present

## 2021-08-13 DIAGNOSIS — K922 Gastrointestinal hemorrhage, unspecified: Secondary | ICD-10-CM | POA: Diagnosis not present

## 2021-09-05 DIAGNOSIS — F331 Major depressive disorder, recurrent, moderate: Secondary | ICD-10-CM

## 2021-09-05 DIAGNOSIS — N183 Chronic kidney disease, stage 3 unspecified: Secondary | ICD-10-CM

## 2021-09-05 DIAGNOSIS — J449 Chronic obstructive pulmonary disease, unspecified: Secondary | ICD-10-CM

## 2021-09-05 DIAGNOSIS — I5030 Unspecified diastolic (congestive) heart failure: Secondary | ICD-10-CM

## 2021-09-05 DIAGNOSIS — I10 Essential (primary) hypertension: Secondary | ICD-10-CM

## 2021-09-05 DIAGNOSIS — K219 Gastro-esophageal reflux disease without esophagitis: Secondary | ICD-10-CM

## 2021-09-05 DIAGNOSIS — J9811 Atelectasis: Secondary | ICD-10-CM

## 2021-09-14 DIAGNOSIS — B351 Tinea unguium: Secondary | ICD-10-CM

## 2021-09-24 DIAGNOSIS — R070 Pain in throat: Secondary | ICD-10-CM | POA: Diagnosis not present

## 2021-10-16 DIAGNOSIS — R21 Rash and other nonspecific skin eruption: Secondary | ICD-10-CM

## 2021-10-16 DIAGNOSIS — R070 Pain in throat: Secondary | ICD-10-CM

## 2021-11-05 DIAGNOSIS — J441 Chronic obstructive pulmonary disease with (acute) exacerbation: Secondary | ICD-10-CM | POA: Diagnosis not present

## 2021-11-05 DIAGNOSIS — F3341 Major depressive disorder, recurrent, in partial remission: Secondary | ICD-10-CM | POA: Diagnosis not present

## 2021-11-05 DIAGNOSIS — I10 Essential (primary) hypertension: Secondary | ICD-10-CM

## 2021-11-05 DIAGNOSIS — I5033 Acute on chronic diastolic (congestive) heart failure: Secondary | ICD-10-CM | POA: Diagnosis not present

## 2021-11-05 DIAGNOSIS — K219 Gastro-esophageal reflux disease without esophagitis: Secondary | ICD-10-CM

## 2021-11-05 DIAGNOSIS — J9611 Chronic respiratory failure with hypoxia: Secondary | ICD-10-CM | POA: Diagnosis not present

## 2021-11-22 DIAGNOSIS — L308 Other specified dermatitis: Secondary | ICD-10-CM | POA: Diagnosis not present

## 2021-12-05 DIAGNOSIS — E1110 Type 2 diabetes mellitus with ketoacidosis without coma: Secondary | ICD-10-CM | POA: Diagnosis not present

## 2021-12-05 DIAGNOSIS — K219 Gastro-esophageal reflux disease without esophagitis: Secondary | ICD-10-CM | POA: Diagnosis not present

## 2021-12-19 DIAGNOSIS — K219 Gastro-esophageal reflux disease without esophagitis: Secondary | ICD-10-CM | POA: Diagnosis not present

## 2021-12-19 DIAGNOSIS — F331 Major depressive disorder, recurrent, moderate: Secondary | ICD-10-CM | POA: Diagnosis not present

## 2021-12-19 DIAGNOSIS — R059 Cough, unspecified: Secondary | ICD-10-CM | POA: Diagnosis not present

## 2021-12-19 DIAGNOSIS — J440 Chronic obstructive pulmonary disease with acute lower respiratory infection: Secondary | ICD-10-CM | POA: Diagnosis not present

## 2021-12-19 DIAGNOSIS — R0602 Shortness of breath: Secondary | ICD-10-CM | POA: Diagnosis not present

## 2021-12-19 DIAGNOSIS — D649 Anemia, unspecified: Secondary | ICD-10-CM | POA: Diagnosis not present

## 2021-12-19 DIAGNOSIS — J9611 Chronic respiratory failure with hypoxia: Secondary | ICD-10-CM | POA: Diagnosis not present

## 2021-12-19 DIAGNOSIS — N184 Chronic kidney disease, stage 4 (severe): Secondary | ICD-10-CM | POA: Diagnosis not present

## 2021-12-19 DIAGNOSIS — I5030 Unspecified diastolic (congestive) heart failure: Secondary | ICD-10-CM | POA: Diagnosis not present

## 2021-12-19 DIAGNOSIS — I10 Essential (primary) hypertension: Secondary | ICD-10-CM | POA: Diagnosis not present

## 2021-12-20 DIAGNOSIS — I10 Essential (primary) hypertension: Secondary | ICD-10-CM | POA: Diagnosis not present

## 2021-12-24 DIAGNOSIS — Z974 Presence of external hearing-aid: Secondary | ICD-10-CM | POA: Diagnosis not present

## 2021-12-25 DIAGNOSIS — J449 Chronic obstructive pulmonary disease, unspecified: Secondary | ICD-10-CM | POA: Diagnosis not present

## 2022-01-07 DIAGNOSIS — J449 Chronic obstructive pulmonary disease, unspecified: Secondary | ICD-10-CM | POA: Diagnosis not present

## 2022-01-09 DIAGNOSIS — L570 Actinic keratosis: Secondary | ICD-10-CM | POA: Diagnosis not present

## 2022-01-09 DIAGNOSIS — L309 Dermatitis, unspecified: Secondary | ICD-10-CM | POA: Diagnosis not present

## 2022-01-09 DIAGNOSIS — L814 Other melanin hyperpigmentation: Secondary | ICD-10-CM | POA: Diagnosis not present

## 2022-01-17 DIAGNOSIS — D649 Anemia, unspecified: Secondary | ICD-10-CM | POA: Diagnosis not present

## 2022-01-17 DIAGNOSIS — I10 Essential (primary) hypertension: Secondary | ICD-10-CM | POA: Diagnosis not present

## 2022-01-17 DIAGNOSIS — K5713 Diverticulitis of small intestine without perforation or abscess with bleeding: Secondary | ICD-10-CM | POA: Diagnosis not present

## 2022-01-28 DIAGNOSIS — G4733 Obstructive sleep apnea (adult) (pediatric): Secondary | ICD-10-CM | POA: Diagnosis not present

## 2022-01-28 DIAGNOSIS — Z9989 Dependence on other enabling machines and devices: Secondary | ICD-10-CM | POA: Diagnosis not present

## 2022-02-11 DIAGNOSIS — D631 Anemia in chronic kidney disease: Secondary | ICD-10-CM | POA: Diagnosis not present

## 2022-02-11 DIAGNOSIS — N2581 Secondary hyperparathyroidism of renal origin: Secondary | ICD-10-CM | POA: Diagnosis not present

## 2022-02-11 DIAGNOSIS — I10 Essential (primary) hypertension: Secondary | ICD-10-CM | POA: Diagnosis not present

## 2022-02-11 DIAGNOSIS — N184 Chronic kidney disease, stage 4 (severe): Secondary | ICD-10-CM | POA: Diagnosis not present

## 2022-02-18 DIAGNOSIS — I10 Essential (primary) hypertension: Secondary | ICD-10-CM | POA: Diagnosis not present

## 2022-02-25 DIAGNOSIS — Z87891 Personal history of nicotine dependence: Secondary | ICD-10-CM | POA: Diagnosis not present

## 2022-02-25 DIAGNOSIS — J449 Chronic obstructive pulmonary disease, unspecified: Secondary | ICD-10-CM | POA: Diagnosis not present

## 2022-02-25 DIAGNOSIS — I509 Heart failure, unspecified: Secondary | ICD-10-CM | POA: Diagnosis not present

## 2022-02-25 DIAGNOSIS — N189 Chronic kidney disease, unspecified: Secondary | ICD-10-CM | POA: Diagnosis not present

## 2022-02-25 DIAGNOSIS — R350 Frequency of micturition: Secondary | ICD-10-CM | POA: Diagnosis not present

## 2022-02-25 DIAGNOSIS — I130 Hypertensive heart and chronic kidney disease with heart failure and stage 1 through stage 4 chronic kidney disease, or unspecified chronic kidney disease: Secondary | ICD-10-CM | POA: Diagnosis not present

## 2022-02-26 ENCOUNTER — Inpatient Hospital Stay: Payer: Medicare PPO

## 2022-02-26 ENCOUNTER — Encounter: Payer: Self-pay | Admitting: Oncology

## 2022-02-26 ENCOUNTER — Inpatient Hospital Stay: Payer: Medicare PPO | Attending: Oncology | Admitting: Oncology

## 2022-02-26 VITALS — BP 146/69 | HR 71 | Temp 96.9°F | Resp 18 | Wt 200.5 lb

## 2022-02-26 DIAGNOSIS — D72828 Other elevated white blood cell count: Secondary | ICD-10-CM

## 2022-02-26 DIAGNOSIS — N184 Chronic kidney disease, stage 4 (severe): Secondary | ICD-10-CM | POA: Insufficient documentation

## 2022-02-26 DIAGNOSIS — K921 Melena: Secondary | ICD-10-CM | POA: Diagnosis not present

## 2022-02-26 DIAGNOSIS — Z8249 Family history of ischemic heart disease and other diseases of the circulatory system: Secondary | ICD-10-CM | POA: Insufficient documentation

## 2022-02-26 DIAGNOSIS — Z836 Family history of other diseases of the respiratory system: Secondary | ICD-10-CM | POA: Insufficient documentation

## 2022-02-26 DIAGNOSIS — Z823 Family history of stroke: Secondary | ICD-10-CM | POA: Insufficient documentation

## 2022-02-26 DIAGNOSIS — D631 Anemia in chronic kidney disease: Secondary | ICD-10-CM

## 2022-02-26 DIAGNOSIS — Z8673 Personal history of transient ischemic attack (TIA), and cerebral infarction without residual deficits: Secondary | ICD-10-CM | POA: Insufficient documentation

## 2022-02-26 DIAGNOSIS — D72829 Elevated white blood cell count, unspecified: Secondary | ICD-10-CM | POA: Insufficient documentation

## 2022-02-26 DIAGNOSIS — Z8546 Personal history of malignant neoplasm of prostate: Secondary | ICD-10-CM | POA: Diagnosis not present

## 2022-02-26 DIAGNOSIS — Z91030 Bee allergy status: Secondary | ICD-10-CM | POA: Insufficient documentation

## 2022-02-26 DIAGNOSIS — Z79899 Other long term (current) drug therapy: Secondary | ICD-10-CM | POA: Diagnosis not present

## 2022-02-26 DIAGNOSIS — Z87891 Personal history of nicotine dependence: Secondary | ICD-10-CM | POA: Diagnosis not present

## 2022-02-26 DIAGNOSIS — R5383 Other fatigue: Secondary | ICD-10-CM | POA: Diagnosis not present

## 2022-02-26 DIAGNOSIS — Z9049 Acquired absence of other specified parts of digestive tract: Secondary | ICD-10-CM | POA: Diagnosis not present

## 2022-02-26 DIAGNOSIS — Z880 Allergy status to penicillin: Secondary | ICD-10-CM | POA: Insufficient documentation

## 2022-02-26 DIAGNOSIS — Z8719 Personal history of other diseases of the digestive system: Secondary | ICD-10-CM | POA: Diagnosis not present

## 2022-02-26 DIAGNOSIS — D75839 Thrombocytosis, unspecified: Secondary | ICD-10-CM | POA: Diagnosis not present

## 2022-02-26 LAB — CBC WITH DIFFERENTIAL/PLATELET
Abs Immature Granulocytes: 0.51 10*3/uL — ABNORMAL HIGH (ref 0.00–0.07)
Basophils Absolute: 0.1 10*3/uL (ref 0.0–0.1)
Basophils Relative: 1 %
Eosinophils Absolute: 0.1 10*3/uL (ref 0.0–0.5)
Eosinophils Relative: 1 %
HCT: 30.6 % — ABNORMAL LOW (ref 39.0–52.0)
Hemoglobin: 10.2 g/dL — ABNORMAL LOW (ref 13.0–17.0)
Immature Granulocytes: 4 %
Lymphocytes Relative: 10 %
Lymphs Abs: 1.3 10*3/uL (ref 0.7–4.0)
MCH: 29.6 pg (ref 26.0–34.0)
MCHC: 33.3 g/dL (ref 30.0–36.0)
MCV: 88.7 fL (ref 80.0–100.0)
Monocytes Absolute: 1.4 10*3/uL — ABNORMAL HIGH (ref 0.1–1.0)
Monocytes Relative: 12 %
Neutro Abs: 8.6 10*3/uL — ABNORMAL HIGH (ref 1.7–7.7)
Neutrophils Relative %: 72 %
Platelets: 433 10*3/uL — ABNORMAL HIGH (ref 150–400)
RBC: 3.45 MIL/uL — ABNORMAL LOW (ref 4.22–5.81)
RDW: 17.1 % — ABNORMAL HIGH (ref 11.5–15.5)
WBC: 12 10*3/uL — ABNORMAL HIGH (ref 4.0–10.5)
nRBC: 0 % (ref 0.0–0.2)

## 2022-02-26 LAB — RETIC PANEL
Immature Retic Fract: 17 % — ABNORMAL HIGH (ref 2.3–15.9)
RBC.: 3.4 MIL/uL — ABNORMAL LOW (ref 4.22–5.81)
Retic Count, Absolute: 82.3 10*3/uL (ref 19.0–186.0)
Retic Ct Pct: 2.4 % (ref 0.4–3.1)
Reticulocyte Hemoglobin: 34.6 pg (ref >27.9)

## 2022-02-26 LAB — IRON AND TIBC
Iron: 91 ug/dL (ref 45–182)
Saturation Ratios: 29 % (ref 17.9–39.5)
TIBC: 309 ug/dL (ref 250–450)
UIBC: 218 ug/dL

## 2022-02-26 LAB — TECHNOLOGIST SMEAR REVIEW
Plt Morphology: NORMAL
RBC MORPHOLOGY: NORMAL
WBC MORPHOLOGY: NORMAL

## 2022-02-26 LAB — FERRITIN: Ferritin: 159 ng/mL (ref 24–336)

## 2022-02-26 LAB — FOLATE: Folate: 10.7 ng/mL (ref >5.9)

## 2022-02-26 LAB — VITAMIN B12: Vitamin B-12: 548 pg/mL (ref 180–914)

## 2022-02-26 MED ORDER — VITAMIN C 250 MG PO TABS: 500.0000 mg | ORAL_TABLET | Freq: Every day | ORAL | 5 refills | Status: DC

## 2022-02-26 NOTE — Assessment & Plan Note (Signed)
Likely reactive. Monitor.  

## 2022-02-26 NOTE — Assessment & Plan Note (Signed)
Likely anemia secondary to chronic kidney disease. Protein electrophoresis was checked 2 to 3 years ago and was negative. Recommend to check CBC, check smear, iron TIBC ferritin, vitamin B12, folate, reticulocyte panel. We discussed about option of oral iron supplementation versus IV Venofer treatments.  Rationale potential side effects of the options were reviewed and discussed with patient.  We also discussed about the rationale of erythropoietin replacement therapy. Labs are reviewed at the time of dictation. Patient hemoglobin 10.2, iron panel showed ferritin of 159, iron saturation 29.  I recommend patient to continue oral iron supplementation ferrous sulfate once daily.  Recommend adding vitamin C to increase absorption.  Hemoglobin is above 10, no need for erythropoietin replacement therapy currently.

## 2022-02-26 NOTE — Progress Notes (Signed)
Patient here to establish care  

## 2022-02-26 NOTE — Assessment & Plan Note (Addendum)
Probably reactive, Due to his age and other comorbidities, I will hold off additional work-up at this point. Recommend observation.

## 2022-02-26 NOTE — Addendum Note (Signed)
Addended by: Earlie Server on: 02/26/2022 09:04 PM   Modules accepted: Orders

## 2022-02-26 NOTE — Progress Notes (Signed)
Hematology/Oncology Consult note [phone] [phone]      Patient Care Team: [name], MD as PCP - General (Internal Medicine) Minna Merritts, MD as Consulting Physician (Cardiology)   REFERRING PROVIDER: Anthonette Legato, MD  CHIEF COMPLAINTS/REASON FOR VISIT:  Anemia  ASSESSMENT & PLAN:  Anemia Likely anemia secondary to chronic kidney disease. Protein electrophoresis was checked 2 to 3 years ago and was negative. Recommend to check CBC, check smear, iron TIBC ferritin, vitamin B12, folate, reticulocyte panel. We discussed about option of oral iron supplementation versus IV Venofer treatments.  Rationale potential side effects of the options were reviewed and discussed with patient.  We also discussed about the rationale of erythropoietin replacement therapy. Labs are reviewed at the time of dictation. Patient hemoglobin 10.2, iron panel showed ferritin of 159, iron saturation 29.  I recommend patient to continue oral iron supplementation ferrous sulfate once daily.  Recommend adding vitamin C to increase absorption.  Hemoglobin is above 10, no need for erythropoietin replacement therapy currently.    Leukocytosis Probably reactive, Due to his age and other comorbidities, I will hold off additional work-up at this point. Recommend observation.   Thrombocytosis Likely reactive.  Monitor.  Orders Placed This Encounter  Procedures   CBC with Differential/Platelet    Standing Status:   Future    Number of Occurrences:   1    Standing Expiration Date:   02/27/2023   Technologist smear review    Standing Status:   Future    Number of Occurrences:   1    Standing Expiration Date:   02/27/2023   Ferritin    Standing Status:   Future    Number of Occurrences:   1    Standing Expiration Date:   08/29/2022   Iron and TIBC    Standing Status:   Future    Number of Occurrences:   1    Standing Expiration Date:   02/27/2023   Vitamin B12     Standing Status:   Future    Number of Occurrences:   1    Standing Expiration Date:   02/27/2023   Folate    Standing Status:   Future    Number of Occurrences:   1    Standing Expiration Date:   02/27/2023   Retic Panel    Standing Status:   Future    Number of Occurrences:   1    Standing Expiration Date:   02/27/2023   Follow-up in 4 months.  Labs prior to MD +/- IV Venofer treatment. All questions were answered. The patient knows to call the clinic with any problems, questions or concerns.  Earlie Server, MD, PhD [facility] Hematology Oncology 02/26/2022     HISTORY OF PRESENTING ILLNESS:  Trevor Ferguson is a  88 y.o.  male with PMH listed below who was referred to me for anemia Reviewed patient's recent labs that was done.  He was found to have abnormal CBC on 10/24/2021 with a hemoglobin 9.9, WBC 11, hematocrit 30, MCV 90.9. 01/17/2022 hemoglobin 8.9 Per nephrology note. Reviewed patient's previous labs ordered by primary care physician's office, anemia is chronic onset  Patient has chronic kidney disease, stage IV. He has chronic shortness of breath, nasal cannula oxygen.+ Fatigue Appetite is not good because food does not taste good. He denies recent chest pain on exertion,pre-syncopal episodes, or palpitations He had not noticed any recent bleeding such as epistaxis, hematuria or hematochezia.  Occasionally, patient notices  blood in the stool. His last colonoscopy was in 2022.  Patient has hemorrhoids. He denies any pica and eats a variety of diet.  Patient was accompanied by her daughter today.  Patient lives in [facility] Patient has a history of prostate cancer. MEDICAL HISTORY:  Past Medical History:  Diagnosis Date   COPD (chronic obstructive pulmonary disease) ([place])    Diverticulitis    Hypertension    Pneumonia    2012,1985 hospitalized   Prostate cancer [facility])    with seed placement   Sleep apnea    noncompliant, dx 1996   Stroke ([place]) 08/19/2010   no  residuals    SURGICAL HISTORY: Past Surgical History:  Procedure Laterality Date   adenoid     APPENDECTOMY     BOWEL RESECTION N/A 06/22/2021   Procedure: SMALL BOWEL RESECTION;  Surgeon: Herbert Pun, MD;  Location: [facility] ORS;  Service: General;  Laterality: N/A;   COLONOSCOPY WITH PROPOFOL N/A 01/28/2021   Procedure: COLONOSCOPY WITH PROPOFOL;  Surgeon: Lucilla Lame, MD;  Location: [facility] ENDOSCOPY;  Service: Endoscopy;  Laterality: N/A;  GI bleed   ENTEROSCOPY N/A 01/30/2021   Procedure: ENTEROSCOPY;  Surgeon: Lin Landsman, MD;  Location[facility] ENDOSCOPY;  Service: Gastroenterology;  Laterality: N/A;   ESOPHAGOGASTRODUODENOSCOPY (EGD) WITH PROPOFOL N/A 01/27/2021   Procedure: ESOPHAGOGASTRODUODENOSCOPY (EGD) WITH PROPOFOL;  Surgeon: Lucilla Lame, MD;  Location: [facility] ENDOSCOPY;  Service: Endoscopy;  Laterality: N/A;   GIVENS CAPSULE STUDY N/A 01/28/2021   Procedure: GIVENS CAPSULE STUDY;  Surgeon: Lin Landsman, MD;  Location: [facility] ENDOSCOPY;  Service: Gastroenterology;  Laterality: N/A;   HEMORRHOID SURGERY     LAPAROTOMY N/A 06/22/2021   Procedure: EXPLORATORY LAPAROTOMY;  Surgeon: Herbert Pun, MD;  Location: [facility] ORS;  Service: General;  Laterality: N/A;   spine cyst     tailbone surgery   TONSILLECTOMY      SOCIAL HISTORY: Social History   Socioeconomic History   Marital status: Married    Spouse name: Not on file   Number of children: Not on file   Years of education: Not on file   Highest education level: Not on file  Occupational History   Not on file  Tobacco Use   Smoking status: Former    Packs/day: 1.50    Years: 40.00    Total pack years: 60.00    Types: Cigarettes, Pipe    Quit date: 08/19/1985    Years since quitting: 36.5   Smokeless tobacco: Never  Vaping Use   Vaping Use: Never used  Substance and Sexual Activity   Alcohol use: Yes    Alcohol/week: 14.0 standard drinks of alcohol    Types: 14 Shots of liquor per week     Comment: occasional   Drug use: No   Sexual activity: Not on file  Other Topics Concern   Not on file  Social History Narrative   Live with your wife at [facility], walks independently, no current oxygen   Social Determinants of Health   Financial Resource Strain: Low Risk  (07/17/2018)   Overall Financial Resource Strain (CARDIA)    Difficulty of Paying Living Expenses: Not hard at all  Food Insecurity: No Food Insecurity (07/17/2018)   Hunger Vital Sign    Worried About Running Out of Food in the Last Year: Never true    Ran Out of Food in the Last Year: Never true  Transportation Needs: No Transportation Needs (07/17/2018)   PRAPARE - Transportation    Lack  of Transportation (Medical): No    Lack of Transportation (Non-Medical): No  Physical Activity: Inactive (07/17/2018)   Exercise Vital Sign    Days of Exercise per Week: 0 days    Minutes of Exercise per Session: 0 min  Stress: No Stress Concern Present (07/17/2018)   [place]    Feeling of Stress : Not at all  Social Connections: [place] (07/17/2018)   Social Connection and Isolation Panel [NHANES]    Frequency of Communication with Friends and Family: Three times a week    Frequency of Social Gatherings with Friends and Family: Three times a week    Attends Religious Services: More than 4 times per year    Active Member of Clubs or Organizations: Yes    Attends Archivist Meetings: More than 4 times per year    Marital Status: Married  Human resources officer Violence: Not At Risk (07/17/2018)   Humiliation, Afraid, Rape, and Kick questionnaire    Fear of Current or Ex-Partner: No    Emotionally Abused: No    Physically Abused: No    Sexually Abused: No    FAMILY HISTORY: Family History  Problem Relation Age of Onset   Congestive Heart Failure Mother    COPD Sister    Asthma Sister    Heart attack Sister     Tuberculosis Maternal Grandmother    Stroke Maternal Grandfather     ALLERGIES:  is allergic to penicillins, bee venom, and darvon [propoxyphene].  MEDICATIONS:  Current Outpatient Medications  Medication Sig Dispense Refill   acetaminophen (TYLENOL) 325 MG tablet Take 2 tablets (650 mg total) by mouth every 6 (six) hours as needed for mild pain (or Fever >/= 101).     acidophilus (RISAQUAD) CAPS capsule Take 2 capsules by mouth 3 (three) times daily. 180 capsule 0   albuterol (ACCUNEB) 0.63 MG/3ML nebulizer solution 3 (three) times daily as needed.     albuterol (VENTOLIN HFA) 108 (90 Base) MCG/ACT inhaler Inhale 1-2 puffs into the lungs every 6 (six) hours as needed for wheezing or shortness of breath.     amLODipine (NORVASC) 5 MG tablet Take 1 tablet (5 mg total) by mouth daily.     betamethasone dipropionate 0.05 % cream Apply topically daily as needed.     Cholecalciferol (VITAMIN D-3) 25 MCG (1000 UT) CAPS Take 1,000 Units by mouth daily.      escitalopram (LEXAPRO) 10 MG tablet Take 10 mg by mouth daily.      famotidine (PEPCID) 10 MG tablet Take 1 tablet (10 mg total) by mouth 2 (two) times daily.     feeding supplement (ENSURE ENLIVE / ENSURE PLUS) LIQD Take 237 mLs by mouth 2 (two) times daily between meals. 237 mL 12   ferrous sulfate 325 (65 FE) MG tablet Take 1 tablet (325 mg total) by mouth daily with breakfast.  3   fluticasone (FLONASE) 50 MCG/ACT nasal spray Place 2 sprays into both nostrils daily.  2   loperamide (IMODIUM) 2 MG capsule Take 1 capsule (2 mg total) by mouth as needed for diarrhea or loose stools. 30 capsule 0   loratadine (CLARITIN) 10 MG tablet Take 10 mg by mouth daily.     melatonin 5 MG TABS Take 5 mg by mouth at bedtime.     omeprazole (PRILOSEC) 20 MG capsule Take 20 mg by mouth daily.     ondansetron (ZOFRAN) 4 MG tablet Take 4 mg by mouth every  8 (eight) hours as needed for nausea or vomiting.     oxyCODONE-acetaminophen (PERCOCET/ROXICET) 5-325 MG  tablet Take 1 tablet by mouth every 6 (six) hours as needed for moderate pain. 10 tablet 0   simethicone (MYLICON) 80 MG chewable tablet Chew 1 tablet (80 mg total) by mouth 4 (four) times daily as needed for flatulence. 30 tablet 0   torsemide 60 MG TABS Take 60 mg by mouth daily.     traZODone (DESYREL) 150 MG tablet Take 150 mg by mouth at bedtime.     triamcinolone cream (KENALOG) 0.1 % Apply 1 application topically 2 (two) times daily.     fluticasone-salmeterol (ADVAIR) 250-50 MCG/ACT AEPB Inhale 1 puff into the lungs 2 (two) times daily. (Patient not taking: Reported on 02/26/2022)     No current facility-administered medications for this visit.    Review of Systems  Constitutional:  Positive for appetite change and fatigue. Negative for chills and fever.  HENT:   Negative for hearing loss and voice change.   Eyes:  Negative for eye problems and icterus.  Respiratory:  Negative for chest tightness, cough and shortness of breath.   Cardiovascular:  Negative for chest pain and leg swelling.  Gastrointestinal:  Negative for abdominal distention and abdominal pain.       Occasionally blood in the stool  Endocrine: Negative for hot flashes.  Genitourinary:  Negative for difficulty urinating, dysuria and frequency.   Musculoskeletal:  Negative for arthralgias.  Skin:  Negative for itching and rash.  Neurological:  Negative for light-headedness and numbness.  Hematological:  Negative for adenopathy. Does not bruise/bleed easily.  Psychiatric/Behavioral:  Negative for confusion.     PHYSICAL EXAMINATION: ECOG PERFORMANCE STATUS: 2 - Symptomatic, <50% confined to bed Vitals:   02/26/22 1102  BP: (!) 146/69  Pulse: 71  Resp: 18  Temp: (!) 96.9 F (36.1 C)  SpO2: 100%   Filed Weights   02/26/22 1102  Weight: 200 lb 8 oz (90.9 kg)    Physical Exam Constitutional:      General: He is not in acute distress.    Appearance: He is obese.     Comments: Frail appearance elderly male  sitting in the wheelchair.  HENT:     Head: Normocephalic and atraumatic.  Eyes:     General: No scleral icterus. Cardiovascular:     Rate and Rhythm: Normal rate and regular rhythm.     Heart sounds: Normal heart sounds.  Pulmonary:     Effort: Pulmonary effort is normal. No respiratory distress.     Breath sounds: No wheezing.     Comments: Significant decreased breath sound bilaterally Abdominal:     General: There is no distension.  Musculoskeletal:        General: No deformity. Normal range of motion.     Cervical back: Normal range of motion and neck supple.  Skin:    General: Skin is warm and dry.     Findings: No erythema or rash.  Neurological:     Mental Status: He is alert and oriented to person, place, and time. Mental status is at baseline.     Cranial Nerves: No cranial nerve deficit.     Coordination: Coordination normal.  Psychiatric:        Mood and Affect: Mood normal.      LABORATORY DATA:  I have reviewed the data as listed     Latest Ref Rng & Units 02/26/2022   11:49 AM 08/12/2021  4:00 AM 08/11/2021    4:31 AM  CBC  WBC 4.0 - 10.5 K/uL 12.0  4.8  5.2   Hemoglobin 13.0 - 17.0 g/dL 10.2  8.1  8.1   Hematocrit 39.0 - 52.0 % 30.6  23.6  23.4   Platelets 150 - 400 K/uL 433  187  178       Latest Ref Rng & Units 08/11/2021    4:31 AM 08/11/2021   12:24 AM 08/10/2021   12:52 PM  CMP  Glucose 70 - 99 mg/dL 143   109   BUN 8 - 23 mg/dL 39   38   Creatinine 0.61 - 1.24 mg/dL 2.20  2.38  2.46   Sodium 135 - 145 mmol/L 134   136   Potassium 3.5 - 5.1 mmol/L 3.7   4.2   Chloride 98 - 111 mmol/L 99   99   CO2 22 - 32 mmol/L 27   28   Calcium 8.9 - 10.3 mg/dL 8.3   8.6   Total Protein 6.5 - 8.1 g/dL 5.5   6.5   Total Bilirubin 0.3 - 1.2 mg/dL 1.6   0.7   Alkaline Phos 38 - 126 U/L 64   81   AST 15 - 41 U/L 17   19   ALT 0 - 44 U/L 16   19        RADIOGRAPHIC STUDIES: I have personally reviewed the radiological images as listed and agreed  with the findings in the report. No results found.

## 2022-02-27 ENCOUNTER — Telehealth: Payer: Self-pay

## 2022-02-27 NOTE — Telephone Encounter (Signed)
-----   Message from Earlie Server, MD sent at 02/26/2022  9:03 PM EDT ----- Please let daughter know that his hemoglobin is 10.2, improved from previous labs.  Iron panel is acceptable.  I recommend patient to continue iron supplementation once daily.  Recommend to add vitamin C 500 mg daily.  Prescription sent to pharmacy. Please arrange patient to follow-up in 4 months, obtain iron labs prior to MD +/- Venofer.

## 2022-02-27 NOTE — Telephone Encounter (Signed)
Pt left VM, stating that she has questions regarding "high WBC count and lots of immature RBC on report."  She's requesting MD to call back her back regarding questions/ concerns  Pryor Curia: [phone]

## 2022-02-27 NOTE — Telephone Encounter (Signed)
Called and spoke with daughter to inform her of father's lab results and Dr. Collie Siad recommendation to continue iron supplementation and to add vitamin C 500 mg daily. Daughter verbalized understanding. Daughter was confused because she thought Dr. Tasia Catchings mentioned if patients hemoglobin was at 10 or better than he would be a candidate for the hormone that was discussed at last visit. Advised that since the Hemoglobin is above 10 there is non need for the erythropoietin replacement. Daughter is disappointed because her and her father were under the impression that he would be a candidate for the therapy if hbg was over 10. Advised that I would communicate with Dr. Tasia Catchings. Daughter request a call to further discuss this.     [place], please schedule patient for: Lab in 4 months, MD/Venofer 2 days after. I will notify daughter of appt. Thanks

## 2022-02-27 NOTE — Telephone Encounter (Signed)
Returned call to Juliann Pulse and informed her that it was clafied with Dr. Tasia Catchings and hormone replacement was needed if hemoglobin was below 10, but since Mr. Spivak hemoglobin was above 10, he was not a candidate for this. Increased WBC count is most likely reacitve and increased immature RBC count was most likely because pt is on iron supplement and his body is regenerating RBCs. Informed her that WBC count can be monitored or aggressive work up can be done if she would like. She did not voice that she wanted to proceed with workup.   Elwanda [place] said she would call back to set up 4 month follow up appt.  Lab with MD/ venofer *new* 1-2 days after labs.

## 2022-02-28 ENCOUNTER — Telehealth: Payer: Self-pay | Admitting: *Deleted

## 2022-02-28 DIAGNOSIS — J449 Chronic obstructive pulmonary disease, unspecified: Secondary | ICD-10-CM | POA: Diagnosis not present

## 2022-02-28 DIAGNOSIS — F3341 Major depressive disorder, recurrent, in partial remission: Secondary | ICD-10-CM | POA: Diagnosis not present

## 2022-02-28 DIAGNOSIS — K219 Gastro-esophageal reflux disease without esophagitis: Secondary | ICD-10-CM | POA: Diagnosis not present

## 2022-02-28 DIAGNOSIS — J9611 Chronic respiratory failure with hypoxia: Secondary | ICD-10-CM | POA: Diagnosis not present

## 2022-02-28 DIAGNOSIS — J1000 Influenza due to other identified influenza virus with unspecified type of pneumonia: Secondary | ICD-10-CM | POA: Diagnosis not present

## 2022-02-28 DIAGNOSIS — I5032 Chronic diastolic (congestive) heart failure: Secondary | ICD-10-CM | POA: Diagnosis not present

## 2022-02-28 DIAGNOSIS — N184 Chronic kidney disease, stage 4 (severe): Secondary | ICD-10-CM | POA: Diagnosis not present

## 2022-02-28 NOTE — Telephone Encounter (Signed)
Spoke to [place] at [facility] ([place] and informed her that order will be faxed for Vitamin C 500 mg daily. Also gave her verbal in case fax did not go through. She repeated instructions back.   Fax: [phone]

## 2022-02-28 NOTE — Telephone Encounter (Signed)
Daughter called stating that all prescription needs to be sent to [facility] so that [facility] will fill it for him. She requests the prescription sent to [facility] the other day be resent to [facility] (Ascorbic Acid) and to call her if any questions about this

## 2022-03-06 DIAGNOSIS — G4733 Obstructive sleep apnea (adult) (pediatric): Secondary | ICD-10-CM | POA: Diagnosis not present

## 2022-03-21 DIAGNOSIS — I10 Essential (primary) hypertension: Secondary | ICD-10-CM | POA: Diagnosis not present

## 2022-04-04 DIAGNOSIS — N3281 Overactive bladder: Secondary | ICD-10-CM | POA: Diagnosis not present

## 2022-04-04 DIAGNOSIS — M7741 Metatarsalgia, right foot: Secondary | ICD-10-CM | POA: Diagnosis not present

## 2022-04-05 DIAGNOSIS — L570 Actinic keratosis: Secondary | ICD-10-CM | POA: Diagnosis not present

## 2022-04-05 DIAGNOSIS — L814 Other melanin hyperpigmentation: Secondary | ICD-10-CM | POA: Diagnosis not present

## 2022-04-05 DIAGNOSIS — L309 Dermatitis, unspecified: Secondary | ICD-10-CM | POA: Diagnosis not present

## 2022-04-17 ENCOUNTER — Non-Acute Institutional Stay (SKILLED_NURSING_FACILITY): Payer: Medicare PPO | Admitting: Student

## 2022-04-17 DIAGNOSIS — J9611 Chronic respiratory failure with hypoxia: Secondary | ICD-10-CM

## 2022-04-17 DIAGNOSIS — J9612 Chronic respiratory failure with hypercapnia: Secondary | ICD-10-CM

## 2022-04-17 DIAGNOSIS — R600 Localized edema: Secondary | ICD-10-CM

## 2022-04-17 DIAGNOSIS — D649 Anemia, unspecified: Secondary | ICD-10-CM

## 2022-04-17 DIAGNOSIS — R351 Nocturia: Secondary | ICD-10-CM | POA: Diagnosis not present

## 2022-04-17 DIAGNOSIS — K5752 Diverticulitis of both small and large intestine without perforation or abscess without bleeding: Secondary | ICD-10-CM

## 2022-04-17 DIAGNOSIS — N401 Enlarged prostate with lower urinary tract symptoms: Secondary | ICD-10-CM

## 2022-04-17 NOTE — Progress Notes (Unsigned)
Location:   [facility]) Nursing Home Room Number: [id number] Place of Service:  Nursing [phone]) Provider:  Amada Kingfisher, MD  Patient Care Team: Dewayne Shorter, MD as PCP - General (Family Medicine) [name], MD as Consulting Physician (Cardiology)  Extended Emergency Contact Information Primary Emergency Contact: Chatfield,Kathryn Address: [phone] [address]          [place], [place] [address] Johnnette Litter of [place] Mobile Phone: [phone] Relation: Daughter Secondary Emergency Contact: [facility] Address: [address]          [place], [place] [address] Johnnette Litter of [place] Phone: [phone] Mobile Phone: [phone] Relation: Spouse  Code Status:  DNAR Goals of care: Advanced Directive information    02/26/2022   10:51 AM  Advanced Directives  Does Patient Have a Medical Advance Directive? Yes  Type of Advance Directive Living will;Healthcare Power of Attorney     Chief Complaint  Patient presents with   Nocturia    HPI:  Pt is a 88 y.o. male seen today for an acute visit for    Past Medical History:  Diagnosis Date   COPD (chronic obstructive pulmonary disease) ([place])    Diverticulitis    Hypertension    Pneumonia    2012,1985 hospitalized   Prostate cancer [facility])    with seed placement   Sleep apnea    noncompliant, dx 1996   Stroke ([place]) 08/19/2010   no residuals   Past Surgical History:  Procedure Laterality Date   adenoid     APPENDECTOMY     BOWEL RESECTION N/A 06/22/2021   Procedure: SMALL BOWEL RESECTION;  Surgeon: Herbert Pun, MD;  Location: [facility] ORS;  Service: General;  Laterality: N/A;   COLONOSCOPY WITH PROPOFOL N/A 01/28/2021   Procedure: COLONOSCOPY WITH PROPOFOL;  Surgeon: Lucilla Lame, MD;  Location: [facility] ENDOSCOPY;  Service: Endoscopy;  Laterality: N/A;  GI bleed   ENTEROSCOPY N/A 01/30/2021   Procedure: ENTEROSCOPY;  Surgeon: Lin Landsman, MD;  Location: [facility] ENDOSCOPY;   Service: Gastroenterology;  Laterality: N/A;   ESOPHAGOGASTRODUODENOSCOPY (EGD) WITH PROPOFOL N/A 01/27/2021   Procedure: ESOPHAGOGASTRODUODENOSCOPY (EGD) WITH PROPOFOL;  Surgeon: Lucilla Lame, MD;  Location: [facility] ENDOSCOPY;  Service: Endoscopy;  Laterality: N/A;   GIVENS CAPSULE STUDY N/A 01/28/2021   Procedure: GIVENS CAPSULE STUDY;  Surgeon: Lin Landsman, MD;  Location: [facility] ENDOSCOPY;  Service: Gastroenterology;  Laterality: N/A;   HEMORRHOID SURGERY     LAPAROTOMY N/A 06/22/2021   Procedure: EXPLORATORY LAPAROTOMY;  Surgeon: Herbert Pun, MD;  Location: [facility] ORS;  Service: General;  Laterality: N/A;   spine cyst     tailbone surgery   TONSILLECTOMY      Allergies  Allergen Reactions   Penicillins Hives, Rash and Other (See Comments)    Rash on hands and blisters on ankles when 88 years old Patient tolerates Zosyn   Bee Venom Palpitations    INSECT BITES/STINGS   Darvon [Propoxyphene] Nausea Only and Rash    Outpatient Encounter Medications as of 04/17/2022  Medication Sig   clobetasol cream (TEMOVATE) 0.05 %    diclofenac Sodium (VOLTAREN) 1 % GEL    fluticasone (FLONASE) 50 MCG/ACT nasal spray Place into the nose.   ipratropium-albuterol (DUONEB) 0.5-2.5 (3) MG/3ML SOLN Inhale into the lungs.   acetaminophen (TYLENOL) 325 MG tablet Take 2 tablets (650 mg total) by mouth every 6 (six) hours as needed for mild pain (or Fever >/= 101).   acidophilus (RISAQUAD) CAPS capsule Take 2 capsules by mouth 3 (  three) times daily.   albuterol (VENTOLIN HFA) 108 (90 Base) MCG/ACT inhaler Inhale 1-2 puffs into the lungs every 6 (six) hours as needed for wheezing or shortness of breath.   amLODipine (NORVASC) 5 MG tablet Take 1 tablet (5 mg total) by mouth daily.   Cholecalciferol (VITAMIN D-3) 25 MCG (1000 UT) CAPS Take 1,000 Units by mouth daily.    escitalopram (LEXAPRO) 10 MG tablet Take 10 mg by mouth daily.    famotidine (PEPCID) 10 MG tablet Take 1 tablet (10 mg total)  by mouth 2 (two) times daily.   feeding supplement (ENSURE ENLIVE / ENSURE PLUS) LIQD Take 237 mLs by mouth 2 (two) times daily between meals.   ferrous sulfate 325 (65 FE) MG tablet Take 1 tablet (325 mg total) by mouth daily with breakfast.   fluticasone-salmeterol (ADVAIR) 250-50 MCG/ACT AEPB Inhale 1 puff into the lungs 2 (two) times daily. (Patient not taking: Reported on 02/26/2022)   loperamide (IMODIUM) 2 MG capsule Take 1 capsule (2 mg total) by mouth as needed for diarrhea or loose stools.   loratadine (CLARITIN) 10 MG tablet Take 10 mg by mouth daily.   melatonin 5 MG TABS Take 5 mg by mouth at bedtime.   omeprazole (PRILOSEC) 20 MG capsule Take 20 mg by mouth daily.   ondansetron (ZOFRAN) 4 MG tablet Take 4 mg by mouth every 8 (eight) hours as needed for nausea or vomiting.   oxyCODONE-acetaminophen (PERCOCET/ROXICET) 5-325 MG tablet Take 1 tablet by mouth every 6 (six) hours as needed for moderate pain.   simethicone (MYLICON) 80 MG chewable tablet Chew 1 tablet (80 mg total) by mouth 4 (four) times daily as needed for flatulence.   torsemide 60 MG TABS Take 60 mg by mouth daily.   traZODone (DESYREL) 150 MG tablet Take 150 mg by mouth at bedtime.   triamcinolone cream (KENALOG) 0.1 % Apply 1 application topically 2 (two) times daily.   vitamin C (ASCORBIC ACID) 250 MG tablet Take 2 tablets (500 mg total) by mouth daily.   [DISCONTINUED] albuterol (ACCUNEB) 0.63 MG/3ML nebulizer solution 3 (three) times daily as needed.   [DISCONTINUED] betamethasone dipropionate 0.05 % cream Apply topically daily as needed.   [DISCONTINUED] fluticasone (FLONASE) 50 MCG/ACT nasal spray Place 2 sprays into both nostrils daily.   No facility-administered encounter medications on file as of 04/17/2022.    Review of Systems  Constitutional: Negative.   Respiratory: Negative.    Genitourinary:  Positive for frequency. Negative for dysuria, hematuria, penile discharge, penile pain and scrotal swelling.    Immunization History  Administered Date(s) Administered   Fluad Quad(high Dose 65+) 06/07/2019   Influenza, High Dose Seasonal PF 07/19/2018   Influenza-Unspecified 06/18/2012, 06/03/2013, 05/19/2014, 06/21/2014, 04/13/2015, 06/11/2016, 05/23/2017   Pneumococcal Conjugate-13 09/11/2015   Pneumococcal-Unspecified 08/19/1998   Tdap 11/29/2004, 10/05/2014   Zoster, Live 10/28/2007   Pertinent  Health Maintenance Due  Topic Date Due   INFLUENZA VACCINE  03/19/2022      08/10/2021   10:59 PM 08/11/2021    2:00 AM 08/11/2021    8:45 AM 08/11/2021    8:10 PM 02/26/2022   10:55 AM  Fall Risk  Patient Fall Risk Level High fall risk High fall risk High fall risk High fall risk High fall risk   Functional Status Survey:    Vitals:   04/17/22 1044  BP: (!) 122/54  Pulse: 77  Resp: 18  SpO2: 95%  Weight: 201 lb 6.4 oz (91.4 kg)   Body mass  index is 30.18 kg/m. Physical Exam Constitutional:      Appearance: Normal appearance.  Cardiovascular:     Rate and Rhythm: Normal rate and regular rhythm.     Pulses: Normal pulses.     Heart sounds: Normal heart sounds.  Pulmonary:     Effort: Pulmonary effort is normal.     Breath sounds: Normal breath sounds.     Comments: 2LNC Abdominal:     General: Abdomen is flat.     Palpations: Abdomen is soft.     Tenderness: There is no abdominal tenderness.  Musculoskeletal:     Comments: Trace bilateral edema   Neurological:     Mental Status: He is alert.  Psychiatric:        Mood and Affect: Mood normal.   Labs reviewed: Recent Labs    06/23/21 0439 06/25/21 0510 07/18/21 0534 08/10/21 1252 08/11/21 0024 08/11/21 0431  NA 134*   < > 135 136  --  134*  K 4.7   < > 3.9 4.2  --  3.7  CL 105   < > 97* 99  --  99  CO2 21*   < > 31 28  --  27  GLUCOSE 160*   < > 138* 109*  --  143*  BUN 53*   < > 100* 38*  --  39*  CREATININE 2.51*   < > 2.09* 2.46* 2.38* 2.20*  CALCIUM 8.0*   < > 9.1 8.6*  --  8.3*  MG 2.2  --   --   --    --  1.5*  PHOS 5.4*  --   --   --   --  4.5   < > = values in this interval not displayed.   Recent Labs    06/21/21 1621 08/10/21 1252 08/11/21 0431  AST 13* 19 17  ALT '9 19 16  '$ ALKPHOS 55 81 64  BILITOT 0.9 0.7 1.6*  PROT 7.0 6.5 5.5*  ALBUMIN 4.2 3.4* 2.6*   Recent Labs    06/27/21 0334 06/28/21 0323 08/10/21 1252 08/11/21 0024 08/11/21 0431 08/12/21 0400 02/26/22 1149  WBC 12.1*   < > 7.1   < > 5.2 4.8 12.0*  NEUTROABS 9.5*  --  6.0  --   --   --  8.6*  HGB 8.8*   < > 5.8*   < > 8.1* 8.1* 10.2*  HCT 26.7*   < > 17.2*   < > 23.4* 23.6* 30.6*  MCV 92.7   < > 92.5   < > 90.0 90.1 88.7  PLT 213   < > 166   < > 178 187 433*   < > = values in this interval not displayed.   Lab Results  Component Value Date   TSH 1.355 03/06/2020   No results found for: "HGBA1C" Lab Results  Component Value Date   CHOL 120 11/07/2020   HDL 61 11/07/2020   LDLCALC 52 11/07/2020   TRIG 37 11/07/2020   CHOLHDL 2.0 11/07/2020    Significant Diagnostic Results in last 30 days:  No results found.  Assessment/Plan 1. Bilateral leg edema Received note from overnight nurse that patient had increased swelling in legs. Minimal swelling today. Encouraged compression and elevation. Continue Torsemide 60 daily.  2. Nocturia associated with benign prostatic hyperplasia Patient has had a trial of flomax in the past. Defer urine samples due to concern for colonization of bacteria in the absence of symptoms outside of frequency. Discussed lifestyle  changes such as decreasing PM water, decreasing caffeine and avoiding bladder sensitive foods. Discussed possibility of starting a medication such as myrbetriq to see if symptoms would improve with treatment of hyperactive bladder. Given potential side effects, patient would like to defer at this time given his age and comorbid condition. Urinal at bedside to maintain independence and minimize PM disruption.   3. Chronic respiratory failure with  hypoxia and hypercapnia (HCC) Likely secondary to underlying COPD and CHF. No changes in O2 requirements. Continue ambulation 2-3x per day.   4. Diverticulitis of both small and large intestine without perforation or abscess without bleeding History of diverticulitis in 2021 and 2022. No active symptoms at this time. Will continue to monitor for blood in stool.   5. Anemia, unspecified type Adding to problem list from external records. Most recent hgb on 04/08/2022 8.7 which is near his baseline. MCV90 Platelets 407    Family/ staff Communication: Nursing updated with plan  Labs/tests ordered:  deferred, will collect at regulatory visit as indicated.   Tomasa Rand, MD, [facility] [phone]

## 2022-04-18 ENCOUNTER — Encounter: Payer: Self-pay | Admitting: Student

## 2022-04-18 DIAGNOSIS — N401 Enlarged prostate with lower urinary tract symptoms: Secondary | ICD-10-CM | POA: Insufficient documentation

## 2022-04-25 DIAGNOSIS — D649 Anemia, unspecified: Secondary | ICD-10-CM | POA: Diagnosis not present

## 2022-04-25 DIAGNOSIS — I10 Essential (primary) hypertension: Secondary | ICD-10-CM | POA: Diagnosis not present

## 2022-04-29 ENCOUNTER — Encounter: Payer: Self-pay | Admitting: Student

## 2022-04-29 ENCOUNTER — Non-Acute Institutional Stay (SKILLED_NURSING_FACILITY): Payer: Medicare PPO | Admitting: Student

## 2022-04-29 DIAGNOSIS — N401 Enlarged prostate with lower urinary tract symptoms: Secondary | ICD-10-CM

## 2022-04-29 DIAGNOSIS — R351 Nocturia: Secondary | ICD-10-CM | POA: Diagnosis not present

## 2022-04-29 DIAGNOSIS — F320 Major depressive disorder, single episode, mild: Secondary | ICD-10-CM | POA: Diagnosis not present

## 2022-04-29 NOTE — Progress Notes (Signed)
Location:  Other [facility]) Nursing Home Room Number: [place]:  SNF [phone]) Provider:  Dr. Amada Kingfisher, MD  Patient Care Team: Dewayne Shorter, MD as PCP - General (Family Medicine) [name], MD as Consulting Physician (Cardiology)  Extended Emergency Contact Information Primary Emergency Contact: Chatfield,Kathryn Address: [phone] [address]          [place], [place] [address] Johnnette Litter of [place] Mobile Phone: [phone] Relation: Daughter Secondary Emergency Contact: [facility] Address: [address]          [place], [place] [address] Johnnette Litter of [place] Phone: [phone] Mobile Phone: [phone] Relation: Spouse  Code Status:  DNR Goals of care: Advanced Directive information    02/26/2022   10:51 AM  Advanced Directives  Does Patient Have a Medical Advance Directive? Yes  Type of Advance Directive Living will;Healthcare Power of Attorney     Chief Complaint  Patient presents with   Acute Visit    Depressed Mood    HPI:  Pt is a 88 y.o. male seen today for an acute visit for his depressed mood. He states for the last few months he has had issues with urinating at night and it's really affecting his sleep and his mood in general. He has never been on a depression medication before. He is interested in starting something that could help with his mood. He has had lack of motivation to participate in typical activities he enjoys. He knows the COPD will continue to worsen over time, but has been fairly stable. He denies fevers, chills, and feels otherwise well. He wants to feel like his normal self again.   He does share that he used to be over numerous campus [employer] groups in [place], [place], [place], and [place]. 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   Past Medical History:  Diagnosis Date   COPD (chronic obstructive pulmonary disease) ([place])    Diverticulitis    Hypertension     Pneumonia    2012,1985 hospitalized   Prostate cancer [facility])    with seed placement   Sleep apnea    noncompliant, dx 1996   Stroke ([place]) 08/19/2010   no residuals   Past Surgical History:  Procedure Laterality Date   adenoid     APPENDECTOMY     BOWEL RESECTION N/A 06/22/2021   Procedure: SMALL BOWEL RESECTION;  Surgeon: Herbert Pun, MD;  Location: [facility] ORS;  Service: General;  Laterality: N/A;   COLONOSCOPY WITH PROPOFOL N/A 01/28/2021   Procedure: COLONOSCOPY WITH PROPOFOL;  Surgeon: Lucilla Lame, MD;  Location: [facility] ENDOSCOPY;  Service: Endoscopy;  Laterality: N/A;  GI bleed   ENTEROSCOPY N/A 01/30/2021   Procedure: ENTEROSCOPY;  Surgeon: Lin Landsman, MD;  Location: [facility] ENDOSCOPY;  Service: Gastroenterology;  Laterality: N/A;   ESOPHAGOGASTRODUODENOSCOPY (EGD) WITH PROPOFOL N/A 01/27/2021   Procedure: ESOPHAGOGASTRODUODENOSCOPY (EGD) WITH PROPOFOL;  Surgeon: Lucilla Lame, MD;  Location: [facility] ENDOSCOPY;  Service: Endoscopy;  Laterality: N/A;   GIVENS CAPSULE STUDY N/A 01/28/2021   Procedure: GIVENS CAPSULE STUDY;  Surgeon: Lin Landsman, MD;  Location: [facility] ENDOSCOPY;  Service: Gastroenterology;  Laterality: N/A;   HEMORRHOID SURGERY     LAPAROTOMY N/A 06/22/2021   Procedure: EXPLORATORY LAPAROTOMY;  Surgeon: Herbert Pun, MD;  Location: [facility] ORS;  Service: General;  Laterality: N/A;   spine cyst     tailbone surgery   TONSILLECTOMY      Allergies  Allergen Reactions   Penicillins Hives,  Rash and Other (See Comments)    Rash on hands and blisters on ankles when 88 years old Patient tolerates Zosyn   Bee Venom Palpitations    INSECT BITES/STINGS   Darvon [Propoxyphene] Nausea Only and Rash    Outpatient Encounter Medications as of 04/29/2022  Medication Sig   acetaminophen (TYLENOL) 325 MG tablet Take 2 tablets (650 mg total) by mouth every 6 (six) hours as needed for mild pain (or Fever >/= 101).   acidophilus (RISAQUAD) CAPS capsule Take  2 capsules by mouth 3 (three) times daily. (Patient taking differently: Take 1 capsule by mouth 3 (three) times daily.)   albuterol (VENTOLIN HFA) 108 (90 Base) MCG/ACT inhaler Inhale 1-2 puffs into the lungs every 6 (six) hours as needed for wheezing or shortness of breath.   amLODipine (NORVASC) 5 MG tablet Take 1 tablet (5 mg total) by mouth daily.   augmented betamethasone dipropionate (DIPROLENE-AF) 0.05 % cream Apply to BLE Lesions topically as needed for rash nightly as needed.   carbamide peroxide (DEBROX) 6.5 % OTIC solution Place 5 drops into both ears daily as needed.   Cholecalciferol (VITAMIN D-3) 25 MCG (1000 UT) CAPS Take 1,000 Units by mouth daily.    diclofenac Sodium (VOLTAREN) 1 % GEL Apply to bilateral shoulders/neck every morning and bedtime.   famotidine (PEPCID) 10 MG tablet Take 1 tablet (10 mg total) by mouth 2 (two) times daily.   feeding supplement (ENSURE ENLIVE / ENSURE PLUS) LIQD Take 237 mLs by mouth 2 (two) times daily between meals.   ferrous sulfate 325 (65 FE) MG tablet Take 1 tablet (325 mg total) by mouth daily with breakfast.   fluticasone (FLONASE) 50 MCG/ACT nasal spray Place 2 sprays into both nostrils daily. As needed.   ipratropium-albuterol (DUONEB) 0.5-2.5 (3) MG/3ML SOLN Inhale 3 mLs into the lungs every 8 (eight) hours as needed.   loperamide (IMODIUM) 2 MG capsule Take 1 capsule (2 mg total) by mouth as needed for diarrhea or loose stools.   loratadine (CLARITIN) 10 MG tablet Take 10 mg by mouth daily.   melatonin 5 MG TABS Take 5 mg by mouth at bedtime.   omeprazole (PRILOSEC) 40 MG capsule Take 40 mg by mouth daily.   ondansetron (ZOFRAN) 4 MG tablet Take 4 mg by mouth every 8 (eight) hours as needed for nausea or vomiting.   oxyCODONE-acetaminophen (PERCOCET/ROXICET) 5-325 MG tablet Take 1 tablet by mouth every 6 (six) hours as needed for moderate pain.   sertraline (ZOLOFT) 50 MG tablet Take 50 mg by mouth daily.   simethicone (MYLICON) 80 MG  chewable tablet Chew 1 tablet (80 mg total) by mouth 4 (four) times daily as needed for flatulence.   torsemide 60 MG TABS Take 60 mg by mouth daily.   traZODone (DESYREL) 150 MG tablet Take 100 mg by mouth at bedtime.   triamcinolone cream (KENALOG) 0.1 % Apply 1 application topically 2 (two) times daily.   vitamin C (ASCORBIC ACID) 250 MG tablet Take 2 tablets (500 mg total) by mouth daily.   [DISCONTINUED] escitalopram (LEXAPRO) 10 MG tablet Take 10 mg by mouth daily.    [DISCONTINUED] clobetasol cream (TEMOVATE) 0.05 %    [DISCONTINUED] fluticasone-salmeterol (ADVAIR) 250-50 MCG/ACT AEPB Inhale 1 puff into the lungs 2 (two) times daily. (Patient not taking: Reported on 02/26/2022)   No facility-administered encounter medications on file as of 04/29/2022.    Review of Systems  All other systems reviewed and are negative.   Immunization History  Administered Date(s) Administered   Fluad Quad(high Dose 65+) 06/07/2019   Influenza, High Dose Seasonal PF 07/19/2018   Influenza-Unspecified 06/18/2012, 06/03/2013, 05/19/2014, 06/21/2014, 04/13/2015, 06/11/2016, 05/23/2017   Pneumococcal Conjugate-13 09/11/2015   Pneumococcal-Unspecified 08/19/1998   Tdap 11/29/2004, 10/05/2014   Zoster, Live 10/28/2007   Pertinent  Health Maintenance Due  Topic Date Due   INFLUENZA VACCINE  03/19/2022      08/10/2021   10:59 PM 08/11/2021    2:00 AM 08/11/2021    8:45 AM 08/11/2021    8:10 PM 02/26/2022   10:55 AM  Fall Risk  Patient Fall Risk Level High fall risk High fall risk High fall risk High fall risk High fall risk   Functional Status Survey:    Vitals:   04/29/22 1038  BP: 124/60  Pulse: 68  Resp: 18  Temp: (!) 97.5 F (36.4 C)  SpO2: 97%  Weight: 202 lb 6.4 oz (91.8 kg)  Height: 5' 8.5" (1.74 m)   Body mass index is 30.33 kg/m. Physical Exam Constitutional:      Appearance: Normal appearance.  Pulmonary:     Effort: Pulmonary effort is normal.  Neurological:      Mental Status: He is alert.  Psychiatric:     Comments: Tearful when discussing his current state     Labs reviewed: Recent Labs    06/23/21 0439 06/25/21 0510 07/18/21 0534 08/10/21 1252 08/11/21 0024 08/11/21 0431  NA 134*   < > 135 136  --  134*  K 4.7   < > 3.9 4.2  --  3.7  CL 105   < > 97* 99  --  99  CO2 21*   < > 31 28  --  27  GLUCOSE 160*   < > 138* 109*  --  143*  BUN 53*   < > 100* 38*  --  39*  CREATININE 2.51*   < > 2.09* 2.46* 2.38* 2.20*  CALCIUM 8.0*   < > 9.1 8.6*  --  8.3*  MG 2.2  --   --   --   --  1.5*  PHOS 5.4*  --   --   --   --  4.5   < > = values in this interval not displayed.   Recent Labs    06/21/21 1621 08/10/21 1252 08/11/21 0431  AST 13* 19 17  ALT '9 19 16  '$ ALKPHOS 55 81 64  BILITOT 0.9 0.7 1.6*  PROT 7.0 6.5 5.5*  ALBUMIN 4.2 3.4* 2.6*   Recent Labs    06/27/21 0334 06/28/21 0323 08/10/21 1252 08/11/21 0024 08/11/21 0431 08/12/21 0400 02/26/22 1149  WBC 12.1*   < > 7.1   < > 5.2 4.8 12.0*  NEUTROABS 9.5*  --  6.0  --   --   --  8.6*  HGB 8.8*   < > 5.8*   < > 8.1* 8.1* 10.2*  HCT 26.7*   < > 17.2*   < > 23.4* 23.6* 30.6*  MCV 92.7   < > 92.5   < > 90.0 90.1 88.7  PLT 213   < > 166   < > 178 187 433*   < > = values in this interval not displayed.   Lab Results  Component Value Date   TSH 1.355 03/06/2020   No results found for: "HGBA1C" Lab Results  Component Value Date   CHOL 120 11/07/2020   HDL 61 11/07/2020   LDLCALC 52 11/07/2020   TRIG  37 11/07/2020   CHOLHDL 2.0 11/07/2020    Significant Diagnostic Results in last 30 days:  No results found.  Assessment/Plan 1. Current mild episode of major depressive disorder, unspecified whether recurrent [facility]) Patient has had an issue with depression. Has been on Lexapro for sometime and is not seeing significant improvement. Patient has also been on trazodone 150 mg and isn't sure it's actually helping with his mood or with his sleep. Discussed the difference  between discontinuing meds and needing to taper. Will plan for trazodone taper by 50 mg every two weeks until at zero. Plan to discontinue Lexapro 10 mg today and start sertraline 50 mg tomorrow. Will plan for rapid increase in meds if indicated. Will follow up in 3-4 weeks.   2. Nocturia associated with benign prostatic hyperplasia Continues to have issues. Deferring medications at this time and encouraging lifestyle adjustments.     Family/ staff Communication: discussed with nursing and his daughter Orlean Bradford ordered:  deferred today, will plan for regular scheduled monthly labs to include BMP, Vitamin D, and B12 levels.   Tomasa Rand, MD, [facility] [phone]

## 2022-05-02 DIAGNOSIS — I10 Essential (primary) hypertension: Secondary | ICD-10-CM | POA: Diagnosis not present

## 2022-05-02 DIAGNOSIS — F329 Major depressive disorder, single episode, unspecified: Secondary | ICD-10-CM | POA: Diagnosis not present

## 2022-05-02 DIAGNOSIS — J9611 Chronic respiratory failure with hypoxia: Secondary | ICD-10-CM | POA: Diagnosis not present

## 2022-05-02 DIAGNOSIS — J449 Chronic obstructive pulmonary disease, unspecified: Secondary | ICD-10-CM | POA: Diagnosis not present

## 2022-05-02 DIAGNOSIS — K219 Gastro-esophageal reflux disease without esophagitis: Secondary | ICD-10-CM | POA: Diagnosis not present

## 2022-05-15 DIAGNOSIS — L814 Other melanin hyperpigmentation: Secondary | ICD-10-CM | POA: Diagnosis not present

## 2022-05-15 DIAGNOSIS — L570 Actinic keratosis: Secondary | ICD-10-CM | POA: Diagnosis not present

## 2022-05-15 DIAGNOSIS — R233 Spontaneous ecchymoses: Secondary | ICD-10-CM | POA: Diagnosis not present

## 2022-05-15 DIAGNOSIS — L853 Xerosis cutis: Secondary | ICD-10-CM | POA: Diagnosis not present

## 2022-05-15 DIAGNOSIS — L309 Dermatitis, unspecified: Secondary | ICD-10-CM | POA: Diagnosis not present

## 2022-05-20 DIAGNOSIS — I129 Hypertensive chronic kidney disease with stage 1 through stage 4 chronic kidney disease, or unspecified chronic kidney disease: Secondary | ICD-10-CM | POA: Diagnosis not present

## 2022-05-20 DIAGNOSIS — R350 Frequency of micturition: Secondary | ICD-10-CM | POA: Diagnosis not present

## 2022-05-20 DIAGNOSIS — N2581 Secondary hyperparathyroidism of renal origin: Secondary | ICD-10-CM | POA: Diagnosis not present

## 2022-05-20 DIAGNOSIS — N1832 Chronic kidney disease, stage 3b: Secondary | ICD-10-CM | POA: Diagnosis not present

## 2022-05-20 DIAGNOSIS — I10 Essential (primary) hypertension: Secondary | ICD-10-CM | POA: Diagnosis not present

## 2022-05-27 ENCOUNTER — Non-Acute Institutional Stay (SKILLED_NURSING_FACILITY): Payer: Medicare PPO | Admitting: Student

## 2022-05-27 ENCOUNTER — Encounter: Payer: Self-pay | Admitting: Student

## 2022-05-27 DIAGNOSIS — K635 Polyp of colon: Secondary | ICD-10-CM

## 2022-05-27 DIAGNOSIS — J9612 Chronic respiratory failure with hypercapnia: Secondary | ICD-10-CM | POA: Diagnosis not present

## 2022-05-27 DIAGNOSIS — R079 Chest pain, unspecified: Secondary | ICD-10-CM | POA: Diagnosis not present

## 2022-05-27 DIAGNOSIS — F320 Major depressive disorder, single episode, mild: Secondary | ICD-10-CM

## 2022-05-27 DIAGNOSIS — Z66 Do not resuscitate: Secondary | ICD-10-CM

## 2022-05-27 DIAGNOSIS — J9611 Chronic respiratory failure with hypoxia: Secondary | ICD-10-CM | POA: Diagnosis not present

## 2022-05-27 DIAGNOSIS — N184 Chronic kidney disease, stage 4 (severe): Secondary | ICD-10-CM | POA: Diagnosis not present

## 2022-05-27 DIAGNOSIS — R351 Nocturia: Secondary | ICD-10-CM

## 2022-05-27 DIAGNOSIS — I4581 Long QT syndrome: Secondary | ICD-10-CM | POA: Diagnosis not present

## 2022-05-27 DIAGNOSIS — N401 Enlarged prostate with lower urinary tract symptoms: Secondary | ICD-10-CM | POA: Diagnosis not present

## 2022-05-27 NOTE — Progress Notes (Signed)
Location:  Other [facility]) Nursing Home Room Number: 218-A Place of Service:  SNF [phone]) Provider:  Dewayne Shorter, MD  Patient Care Team: Dewayne Shorter, MD as PCP - General (Family Medicine) Minna Merritts, MD as Consulting Physician (Cardiology)  Extended Emergency Contact Information Primary Emergency Contact: Chatfield,Kathryn Address: [phone]          [place], [place] [address] Johnnette Litter of [place] Mobile Phone: [phone] Relation: Daughter Secondary Emergency Contact: [facility] Address: [address]          [place], [place] [address] Johnnette Litter of [place] Phone: [phone] Mobile Phone: [phone] Relation: Spouse  Code Status:  DNR Goals of care: Advanced Directive information    02/26/2022   10:51 AM  Advanced Directives  Does Patient Have a Medical Advance Directive? Yes  Type of Advance Directive Living will;Healthcare Power of Attorney     Chief Complaint  Patient presents with   Acute Visit    Depression     HPI:  Pt is a 88 y.o. male seen today for an acute visit for follow up of mood. Patient states he has no pain, he just can't seem to have improvement of his mood. Patient states he has had low mood. He has felt fatigued. Wondered if he had atrial fibrillation after seeing a BB player on a commercial based on how he feels. No pain at all. He feels depressed. He knows he is on the medication and wonders what can be done. Denies chest pain. Chronic shortness of breath.    He has skin changes that he was seen by derm for. He has a cream, but doesn't understand what's causing the skin change.   He has had semiloose stools for the last month. No diarrhea. No blood in stool, difficult to see difference between blood and iron effect in stools.    Past Medical History:  Diagnosis Date   COPD (chronic obstructive pulmonary disease) ([place])    Diverticulitis    Hypertension    Pneumonia    2012,1985 hospitalized    Prostate cancer [facility])    with seed placement   Sleep apnea    noncompliant, dx 1996   Stroke ([place]) 08/19/2010   no residuals   Past Surgical History:  Procedure Laterality Date   adenoid     APPENDECTOMY     BOWEL RESECTION N/A 06/22/2021   Procedure: SMALL BOWEL RESECTION;  Surgeon: Herbert Pun, MD;  Location: [facility] ORS;  Service: General;  Laterality: N/A;   COLONOSCOPY WITH PROPOFOL N/A 01/28/2021   Procedure: COLONOSCOPY WITH PROPOFOL;  Surgeon: Lucilla Lame, MD;  Location: [facility] ENDOSCOPY;  Service: Endoscopy;  Laterality: N/A;  GI bleed   ENTEROSCOPY N/A 01/30/2021   Procedure: ENTEROSCOPY;  Surgeon: Lin Landsman, MD;  Location: [facility] ENDOSCOPY;  Service: Gastroenterology;  Laterality: N/A;   ESOPHAGOGASTRODUODENOSCOPY (EGD) WITH PROPOFOL N/A 01/27/2021   Procedure: ESOPHAGOGASTRODUODENOSCOPY (EGD) WITH PROPOFOL;  Surgeon: Lucilla Lame, MD;  Location: [facility] ENDOSCOPY;  Service: Endoscopy;  Laterality: N/A;   GIVENS CAPSULE STUDY N/A 01/28/2021   Procedure: GIVENS CAPSULE STUDY;  Surgeon: Lin Landsman, MD;  Location: [facility] ENDOSCOPY;  Service: Gastroenterology;  Laterality: N/A;   HEMORRHOID SURGERY     LAPAROTOMY N/A 06/22/2021   Procedure: EXPLORATORY LAPAROTOMY;  Surgeon: Herbert Pun, MD;  Location: [facility] ORS;  Service: General;  Laterality: N/A;   spine cyst     tailbone surgery   TONSILLECTOMY      Allergies  Allergen Reactions   Penicillins  Hives, Rash and Other (See Comments)    Rash on hands and blisters on ankles when 88 years old Patient tolerates Zosyn   Bee Venom Palpitations    INSECT BITES/STINGS   Darvon [Propoxyphene] Nausea Only and Rash    Outpatient Encounter Medications as of 05/27/2022  Medication Sig   acetaminophen (TYLENOL) 325 MG tablet Take 2 tablets (650 mg total) by mouth every 6 (six) hours as needed for mild pain (or Fever >/= 101).   albuterol (ACCUNEB) 0.63 MG/3ML nebulizer solution Take 1 ampule by  nebulization every 8 (eight) hours as needed for wheezing or shortness of breath.   albuterol (VENTOLIN HFA) 108 (90 Base) MCG/ACT inhaler Inhale 2 puffs into the lungs every 6 (six) hours as needed for wheezing or shortness of breath.   amLODipine (NORVASC) 5 MG tablet Take 1 tablet (5 mg total) by mouth daily.   ascorbic acid (VITAMIN C) 500 MG tablet Take 500 mg by mouth daily.   carbamide peroxide (DEBROX) 6.5 % OTIC solution Place 5 drops into both ears daily as needed.   cetaphil (CETAPHIL) lotion Apply 1 Application topically 2 (two) times daily. Day and evening shift apply to bilateral legs for dry skin after triamcinolone   Cholecalciferol (VITAMIN D-3) 25 MCG (1000 UT) CAPS Take 1,000 Units by mouth daily.    diclofenac Sodium (VOLTAREN) 1 % GEL Apply to bilateral shoulders/neck every morning and bedtime.   famotidine (PEPCID) 10 MG tablet Take 1 tablet (10 mg total) by mouth 2 (two) times daily.   ferrous sulfate 325 (65 FE) MG tablet Take 1 tablet (325 mg total) by mouth daily with breakfast.   fluticasone (FLONASE) 50 MCG/ACT nasal spray Place 2 sprays into both nostrils daily as needed.   Lactobacillus (ACIDOPHILUS) CAPS capsule Take 1 capsule by mouth 3 (three) times daily. For prophylaxis supplement   loperamide (IMODIUM) 2 MG capsule Take 1 capsule (2 mg total) by mouth as needed for diarrhea or loose stools.   loratadine (CLARITIN) 10 MG tablet Take 10 mg by mouth daily.   melatonin 3 MG TABS tablet Take 3 mg by mouth at bedtime. may have one '3mg'$  tab per request in addition to routine '5mg'$ .   melatonin 5 MG TABS Take 5 mg by mouth at bedtime.   omeprazole (PRILOSEC) 40 MG capsule Take 40 mg by mouth daily.   ondansetron (ZOFRAN) 4 MG tablet Take 4 mg by mouth every 8 (eight) hours as needed for nausea or vomiting.   oxyCODONE-acetaminophen (PERCOCET/ROXICET) 5-325 MG tablet Take 1 tablet by mouth every 6 (six) hours as needed for moderate pain.   sertraline (ZOLOFT) 50 MG tablet  Take 50 mg by mouth daily.   simethicone (MYLICON) 80 MG chewable tablet Chew 80 mg by mouth every 4 (four) hours as needed for flatulence (or upset stomach).   torsemide (DEMADEX) 20 MG tablet Take 40 mg by mouth daily.   traZODone (DESYREL) 50 MG tablet Take 50 mg by mouth at bedtime.   triamcinolone cream (KENALOG) 0.1 % Apply 1 application topically 2 (two) times daily.   [DISCONTINUED] acidophilus (RISAQUAD) CAPS capsule Take 2 capsules by mouth 3 (three) times daily.   [DISCONTINUED] augmented betamethasone dipropionate (DIPROLENE-AF) 0.05 % cream Apply to BLE Lesions topically as needed for rash nightly as needed.   [DISCONTINUED] feeding supplement (ENSURE ENLIVE / ENSURE PLUS) LIQD Take 237 mLs by mouth 2 (two) times daily between meals.   [DISCONTINUED] ipratropium-albuterol (DUONEB) 0.5-2.5 (3) MG/3ML SOLN Inhale 3 mLs into  the lungs every 8 (eight) hours as needed.   [DISCONTINUED] simethicone (MYLICON) 80 MG chewable tablet Chew 1 tablet (80 mg total) by mouth 4 (four) times daily as needed for flatulence.   [DISCONTINUED] torsemide 60 MG TABS Take 60 mg by mouth daily.   [DISCONTINUED] traZODone (DESYREL) 150 MG tablet Take 100 mg by mouth at bedtime.   [DISCONTINUED] vitamin C (ASCORBIC ACID) 250 MG tablet Take 2 tablets (500 mg total) by mouth daily.   No facility-administered encounter medications on file as of 05/27/2022.    Review of Systems  Psychiatric/Behavioral:  Positive for dysphoric mood and sleep disturbance.        Chronic sleep issues due to nocturia.     Immunization History  Administered Date(s) Administered   Fluad Quad(high Dose 65+) 06/07/2019   Influenza, High Dose Seasonal PF 07/19/2018   Influenza-Unspecified 06/18/2012, 06/03/2013, 05/19/2014, 06/21/2014, 04/13/2015, 06/11/2016, 05/23/2017   Pneumococcal Conjugate-13 09/11/2015   Pneumococcal-Unspecified 08/19/1998   Tdap 11/29/2004, 10/05/2014   Zoster, Live 10/28/2007   Pertinent  Health  Maintenance Due  Topic Date Due   INFLUENZA VACCINE  03/19/2022      08/10/2021   10:59 PM 08/11/2021    2:00 AM 08/11/2021    8:45 AM 08/11/2021    8:10 PM 02/26/2022   10:55 AM  Fall Risk  Patient Fall Risk Level High fall risk High fall risk High fall risk High fall risk High fall risk   Functional Status Survey:    Vitals:   05/27/22 1029  BP: (!) 140/56  Pulse: 67  Temp: (!) 97 F (36.1 C)  SpO2: 95%  Weight: 200 lb 3.2 oz (90.8 kg)  Height: 5' 8.5" (1.74 m)   Body mass index is 30 kg/m. Physical Exam Constitutional:      Appearance: Normal appearance.  Cardiovascular:     Rate and Rhythm: Normal rate. Rhythm irregular.     Pulses: Normal pulses.     Heart sounds: Normal heart sounds.  Pulmonary:     Breath sounds: Normal breath sounds.  Abdominal:     General: Abdomen is flat. Bowel sounds are normal.     Palpations: Abdomen is soft.  Musculoskeletal:        General: No swelling or tenderness.  Skin:    Capillary Refill: Capillary refill takes 2 to 3 seconds.     Comments: Bilateral legs with notable 48m macules scattered diffusely.   Neurological:     Mental Status: He is alert and oriented to person, place, and time.     Gait: Gait normal.  Psychiatric:     Comments: Depressed mood      Labs reviewed: Recent Labs    06/23/21 0439 06/25/21 0510 07/18/21 0534 08/10/21 1252 08/11/21 0024 08/11/21 0431  NA 134*   < > 135 136  --  134*  K 4.7   < > 3.9 4.2  --  3.7  CL 105   < > 97* 99  --  99  CO2 21*   < > 31 28  --  27  GLUCOSE 160*   < > 138* 109*  --  143*  BUN 53*   < > 100* 38*  --  39*  CREATININE 2.51*   < > 2.09* 2.46* 2.38* 2.20*  CALCIUM 8.0*   < > 9.1 8.6*  --  8.3*  MG 2.2  --   --   --   --  1.5*  PHOS 5.4*  --   --   --   --  4.5   < > = values in this interval not displayed.   Recent Labs    06/21/21 1621 08/10/21 1252 08/11/21 0431  AST 13* 19 17  ALT '9 19 16  '$ ALKPHOS 55 81 64  BILITOT 0.9 0.7 1.6*  PROT 7.0 6.5  5.5*  ALBUMIN 4.2 3.4* 2.6*   Recent Labs    06/27/21 0334 06/28/21 0323 08/10/21 1252 08/11/21 0024 08/11/21 0431 08/12/21 0400 02/26/22 1149  WBC 12.1*   < > 7.1   < > 5.2 4.8 12.0*  NEUTROABS 9.5*  --  6.0  --   --   --  8.6*  HGB 8.8*   < > 5.8*   < > 8.1* 8.1* 10.2*  HCT 26.7*   < > 17.2*   < > 23.4* 23.6* 30.6*  MCV 92.7   < > 92.5   < > 90.0 90.1 88.7  PLT 213   < > 166   < > 178 187 433*   < > = values in this interval not displayed.   Lab Results  Component Value Date   TSH 1.355 03/06/2020   No results found for: "HGBA1C" Lab Results  Component Value Date   CHOL 120 11/07/2020   HDL 61 11/07/2020   LDLCALC 52 11/07/2020   TRIG 37 11/07/2020   CHOLHDL 2.0 11/07/2020    Significant Diagnostic Results in last 30 days:  No results found.  Assessment/Plan 1. Do not resuscitate DNR form completed and on record.   2. Current mild episode of major depressive disorder, unspecified whether recurrent [facility]) Patient has had issues with depression for some time. Discussed recent change in medications. Will plan to increase sertraline to 100 and if mood remains low, rapid titration over the next month every 2 weeks to '200mg'$  daily. Continue lower dose of trazodone given no effect with goal of discontinuing in the future. Patient denies palpitations. Given his concern for a fib and numerous changes in potentially cardiotoxic drugs, will plan for ECG. ECG performed and results show NSR at a normal rate. It seems like patient is also aware all of his conditions are only getting worse and it is starting to impact is overall outlook on wife.  3. Chronic respiratory failure with hypoxia and hypercapnia (HCC) Patient continues to have symptoms well-controlled on current regimen, however, patient has generalized fatigue.  4. Nocturia associated with benign prostatic hyperplasia Continues to impact patient's sleep. Based on conversation will continue to avoid medications.   5.  Chronic kidney disease, stage IV (severe) ([place]) Patient followed by nephrology. Continued decline in renal function. Recent decrease in torsemide dose to 40 mg daily.   6. Polyp of sigmoid colon, unspecified type Hx of anemia secondary to GI bleed. Most recent Fe 114 1 month ago, CBC stable at 8.4.      Family/ staff Communication: daughter and wife updated, nursing staff updated   Labs/tests ordered:  recent CBC and iron panel, will repeat CMP, BNP  Tomasa Rand, MD, [facility] [phone]

## 2022-05-30 ENCOUNTER — Encounter: Payer: Self-pay | Admitting: Student

## 2022-05-30 DIAGNOSIS — D649 Anemia, unspecified: Secondary | ICD-10-CM | POA: Diagnosis not present

## 2022-05-30 DIAGNOSIS — I10 Essential (primary) hypertension: Secondary | ICD-10-CM | POA: Diagnosis not present

## 2022-05-30 DIAGNOSIS — K5713 Diverticulitis of small intestine without perforation or abscess with bleeding: Secondary | ICD-10-CM | POA: Diagnosis not present

## 2022-06-03 DIAGNOSIS — R350 Frequency of micturition: Secondary | ICD-10-CM | POA: Diagnosis not present

## 2022-06-03 DIAGNOSIS — N2581 Secondary hyperparathyroidism of renal origin: Secondary | ICD-10-CM | POA: Diagnosis not present

## 2022-06-03 DIAGNOSIS — N1832 Chronic kidney disease, stage 3b: Secondary | ICD-10-CM | POA: Diagnosis not present

## 2022-06-03 DIAGNOSIS — I129 Hypertensive chronic kidney disease with stage 1 through stage 4 chronic kidney disease, or unspecified chronic kidney disease: Secondary | ICD-10-CM | POA: Diagnosis not present

## 2022-06-05 DIAGNOSIS — R233 Spontaneous ecchymoses: Secondary | ICD-10-CM | POA: Diagnosis not present

## 2022-06-05 DIAGNOSIS — D492 Neoplasm of unspecified behavior of bone, soft tissue, and skin: Secondary | ICD-10-CM | POA: Diagnosis not present

## 2022-06-05 DIAGNOSIS — L570 Actinic keratosis: Secondary | ICD-10-CM | POA: Diagnosis not present

## 2022-06-05 DIAGNOSIS — L814 Other melanin hyperpigmentation: Secondary | ICD-10-CM | POA: Diagnosis not present

## 2022-06-05 DIAGNOSIS — L853 Xerosis cutis: Secondary | ICD-10-CM | POA: Diagnosis not present

## 2022-06-17 DIAGNOSIS — G4733 Obstructive sleep apnea (adult) (pediatric): Secondary | ICD-10-CM | POA: Diagnosis not present

## 2022-06-19 DIAGNOSIS — D649 Anemia, unspecified: Secondary | ICD-10-CM | POA: Diagnosis not present

## 2022-06-19 DIAGNOSIS — I5032 Chronic diastolic (congestive) heart failure: Secondary | ICD-10-CM | POA: Diagnosis not present

## 2022-06-19 DIAGNOSIS — N1832 Chronic kidney disease, stage 3b: Secondary | ICD-10-CM | POA: Diagnosis not present

## 2022-06-20 DIAGNOSIS — K5732 Diverticulitis of large intestine without perforation or abscess without bleeding: Secondary | ICD-10-CM | POA: Diagnosis not present

## 2022-06-24 ENCOUNTER — Encounter: Payer: Self-pay | Admitting: Student

## 2022-06-24 ENCOUNTER — Non-Acute Institutional Stay (SKILLED_NURSING_FACILITY): Payer: Medicare PPO | Admitting: Student

## 2022-06-24 DIAGNOSIS — K649 Unspecified hemorrhoids: Secondary | ICD-10-CM

## 2022-06-24 DIAGNOSIS — Z8719 Personal history of other diseases of the digestive system: Secondary | ICD-10-CM

## 2022-06-24 DIAGNOSIS — J449 Chronic obstructive pulmonary disease, unspecified: Secondary | ICD-10-CM

## 2022-06-24 DIAGNOSIS — R0902 Hypoxemia: Secondary | ICD-10-CM

## 2022-06-24 DIAGNOSIS — F320 Major depressive disorder, single episode, mild: Secondary | ICD-10-CM

## 2022-06-24 DIAGNOSIS — I5032 Chronic diastolic (congestive) heart failure: Secondary | ICD-10-CM | POA: Diagnosis not present

## 2022-06-24 DIAGNOSIS — G4730 Sleep apnea, unspecified: Secondary | ICD-10-CM

## 2022-06-24 DIAGNOSIS — N189 Chronic kidney disease, unspecified: Secondary | ICD-10-CM

## 2022-06-24 DIAGNOSIS — R351 Nocturia: Secondary | ICD-10-CM

## 2022-06-24 DIAGNOSIS — N184 Chronic kidney disease, stage 4 (severe): Secondary | ICD-10-CM

## 2022-06-24 DIAGNOSIS — N401 Enlarged prostate with lower urinary tract symptoms: Secondary | ICD-10-CM

## 2022-06-24 DIAGNOSIS — D631 Anemia in chronic kidney disease: Secondary | ICD-10-CM

## 2022-06-24 DIAGNOSIS — N2581 Secondary hyperparathyroidism of renal origin: Secondary | ICD-10-CM | POA: Diagnosis not present

## 2022-06-24 NOTE — Progress Notes (Signed)
Location:  Other [facility]) Nursing Home Room Number: 218-A Place of Service:  SNF [phone]) Provider:  Dewayne Shorter, MD  Patient Care Team: Dewayne Shorter, MD as PCP - General (Family Medicine) Minna Merritts, MD as Consulting Physician (Cardiology)  Extended Emergency Contact Information Primary Emergency Contact: Chatfield,Kathryn Address: [phone]          [place], [place] [address] Johnnette Litter of [place] Phone: [phone] Relation: Daughter Secondary Emergency Contact: [facility] Address: [address]          [place],  [address] Johnnette Litter of [place] Phone: [phone] Mobile Phone: [phone] Relation: Spouse  Code Status:  DNR Goals of care: Advanced Directive information    06/24/2022   10:20 AM  Advanced Directives  Does Patient Have a Medical Advance Directive? Yes  Type of Advance Directive Out of facility DNR (pink MOST or yellow form)  Does patient want to make changes to medical advance directive? No - Patient declined  Pre-existing out of facility DNR order (yellow form or pink MOST form) Yellow form placed in chart (order not valid for inpatient use);Pink MOST form placed in chart (order not valid for inpatient use)     Chief Complaint  Patient presents with   Medical Management of Chronic Issues    Routine visit. Discuss need for AWV, covid boosters, shingrix, and pneumonia vaccines or post pone/exclude if patient refuses. Vitals and medications are a reflection of [facility]MR system, [facility]     HPI:  Pt is a 88 y.o. male seen today for medical management of chronic diseases.   Patient is here with his daughter and wife.   He states his mood has been good. He urinates and has bowel movements at the same time. He had a day with some dysuria, but has since had improvement.   He is playing games with his daughter and wife. He has photos of his church in [place] that he would like to share with  me.   Past Medical History:  Diagnosis Date   Acute GI bleeding    COPD (chronic obstructive pulmonary disease) ([place])    Diverticulitis    GI bleed 01/26/2021   Hypertension    Pneumonia    2012,1985 hospitalized   Prostate cancer ([place])    with seed placement   Pulmonary edema    Rectal bleeding    Sleep apnea    noncompliant, dx 1996   Stroke ([place]) 08/19/2010   no residuals   Past Surgical History:  Procedure Laterality Date   adenoid     APPENDECTOMY     BOWEL RESECTION N/A 06/22/2021   Procedure: SMALL BOWEL RESECTION;  Surgeon: Herbert Pun, MD;  Location: [facility] ORS;  Service: General;  Laterality: N/A;   COLONOSCOPY WITH PROPOFOL N/A 01/28/2021   Procedure: COLONOSCOPY WITH PROPOFOL;  Surgeon: Lucilla Lame, MD;  Location: [facility] ENDOSCOPY;  Service: Endoscopy;  Laterality: N/A;  GI bleed   ENTEROSCOPY N/A 01/30/2021   Procedure: ENTEROSCOPY;  Surgeon: Lin Landsman, MD;  Location: [facility] ENDOSCOPY;  Service: Gastroenterology;  Laterality: N/A;   ESOPHAGOGASTRODUODENOSCOPY (EGD) WITH PROPOFOL N/A 01/27/2021   Procedure: ESOPHAGOGASTRODUODENOSCOPY (EGD) WITH PROPOFOL;  Surgeon: Lucilla Lame, MD;  Location: [facility] ENDOSCOPY;  Service: Endoscopy;  Laterality: N/A;   GIVENS CAPSULE STUDY N/A 01/28/2021   Procedure: GIVENS CAPSULE STUDY;  Surgeon: Lin Landsman, MD;  Location: [facility] ENDOSCOPY;  Service: Gastroenterology;  Laterality: N/A;   HEMORRHOID SURGERY     LAPAROTOMY N/A  06/22/2021   Procedure: EXPLORATORY LAPAROTOMY;  Surgeon: Herbert Pun, MD;  Location: [facility] ORS;  Service: General;  Laterality: N/A;   spine cyst     tailbone surgery   TONSILLECTOMY      Allergies  Allergen Reactions   Penicillins Hives, Rash and Other (See Comments)    Rash on hands and blisters on ankles when 88 years old Patient tolerates Zosyn   Bee Venom Palpitations    INSECT BITES/STINGS   Darvon [Propoxyphene] Nausea Only and Rash    Outpatient Encounter  Medications as of 06/24/2022  Medication Sig   acetaminophen (TYLENOL) 325 MG tablet Take 2 tablets (650 mg total) by mouth every 6 (six) hours as needed for mild pain (or Fever >/= 101).   albuterol (ACCUNEB) 0.63 MG/3ML nebulizer solution Take 1 ampule by nebulization every 8 (eight) hours as needed for wheezing or shortness of breath.   albuterol (VENTOLIN HFA) 108 (90 Base) MCG/ACT inhaler Inhale 2 puffs into the lungs every 6 (six) hours as needed for wheezing or shortness of breath.   amLODipine (NORVASC) 5 MG tablet Take 1 tablet (5 mg total) by mouth daily.   ascorbic acid (VITAMIN C) 500 MG tablet Take 500 mg by mouth daily.   carbamide peroxide (DEBROX) 6.5 % OTIC solution Place 5 drops into both ears daily as needed.   cetaphil (CETAPHIL) lotion Apply 1 Application topically 2 (two) times daily. Day and evening shift apply to bilateral legs for dry skin after triamcinolone   Cholecalciferol (VITAMIN D-3) 25 MCG (1000 UT) CAPS Take 1,000 Units by mouth daily.    diclofenac Sodium (VOLTAREN) 1 % GEL Apply to bilateral shoulders/neck every morning and bedtime.   famotidine (PEPCID) 10 MG tablet Take 1 tablet (10 mg total) by mouth 2 (two) times daily.   ferrous sulfate 325 (65 FE) MG tablet Take 1 tablet (325 mg total) by mouth daily with breakfast.   fluorouracil (EFUDEX) 5 % cream Apply 1 Application topically as directed. Apply to lesion top of head topically every day and evening shift 14 days on and 7 days off for Neoplasm of uncertain behavior   fluticasone (FLONASE) 50 MCG/ACT nasal spray Place 2 sprays into both nostrils daily as needed.   Lactobacillus (ACIDOPHILUS) CAPS capsule Take 1 capsule by mouth 3 (three) times daily. For prophylaxis supplement   lactose free nutrition (BOOST) LIQD Take 237 mLs by mouth every 12 (twelve) hours as needed (for weight managment).   loratadine (CLARITIN) 10 MG tablet Take 10 mg by mouth daily.   melatonin 3 MG TABS tablet Take 3 mg by mouth at  bedtime. may have one '3mg'$  tab per request in addition to routine '5mg'$ .   melatonin 5 MG TABS Take 5 mg by mouth at bedtime.   omeprazole (PRILOSEC) 40 MG capsule Take 40 mg by mouth daily.   oxyCODONE-acetaminophen (PERCOCET/ROXICET) 5-325 MG tablet Take 1 tablet by mouth every 6 (six) hours as needed for moderate pain.   OXYGEN Inhale 2 L into the lungs as directed. Every shift for COPD and SOB   sertraline (ZOLOFT) 100 MG tablet Take 100 mg by mouth daily.   simethicone (MYLICON) 80 MG chewable tablet Chew 80 mg by mouth every 4 (four) hours as needed for flatulence (or upset stomach).   tamsulosin (FLOMAX) 0.4 MG CAPS capsule Take 0.4 mg by mouth at bedtime.   torsemide (DEMADEX) 20 MG tablet Take 40 mg by mouth daily. And 1 tablet by mouth every 72 hours as needed  for increased edema   triamcinolone cream (KENALOG) 0.1 % Apply 1 application topically 2 (two) times daily.   [DISCONTINUED] loperamide (IMODIUM) 2 MG capsule Take 1 capsule (2 mg total) by mouth as needed for diarrhea or loose stools.   [DISCONTINUED] ondansetron (ZOFRAN) 4 MG tablet Take 4 mg by mouth every 8 (eight) hours as needed for nausea or vomiting.   [DISCONTINUED] sertraline (ZOLOFT) 50 MG tablet Take 50 mg by mouth daily.   [DISCONTINUED] traZODone (DESYREL) 50 MG tablet Take 50 mg by mouth at bedtime.   No facility-administered encounter medications on file as of 06/24/2022.    Review of Systems  Immunization History  Administered Date(s) Administered   Fluad Quad(high Dose 65+) 06/07/2019   Influenza, High Dose Seasonal PF 07/19/2018, 06/04/2022   Influenza-Unspecified 06/18/2012, 06/03/2013, 05/19/2014, 06/21/2014, 04/13/2015, 06/11/2016, 05/23/2017   Pneumococcal Conjugate-13 09/11/2015   Pneumococcal-Unspecified 08/19/1998   Tdap 11/29/2004, 10/05/2014   Zoster, Live 10/28/2007   Pertinent  Health Maintenance Due  Topic Date Due   INFLUENZA VACCINE  Completed      08/10/2021   10:59 PM 08/11/2021     2:00 AM 08/11/2021    8:45 AM 08/11/2021    8:10 PM 02/26/2022   10:55 AM  Fall Risk  Patient Fall Risk Level High fall risk High fall risk High fall risk High fall risk High fall risk   Functional Status Survey:    Vitals:   06/24/22 1019  BP: (!) 154/66  Pulse: 70  SpO2: 95%  Weight: 201 lb 3.2 oz (91.3 kg)  Height: 5' 8.5" (1.74 m)   Body mass index is 30.15 kg/m. Physical Exam  Labs reviewed: Recent Labs    07/18/21 0534 08/10/21 1252 08/11/21 0024 08/11/21 0431  NA 135 136  --  134*  K 3.9 4.2  --  3.7  CL 97* 99  --  99  CO2 31 28  --  27  GLUCOSE 138* 109*  --  143*  BUN 100* 38*  --  39*  CREATININE 2.09* 2.46* 2.38* 2.20*  CALCIUM 9.1 8.6*  --  8.3*  MG  --   --   --  1.5*  PHOS  --   --   --  4.5   Recent Labs    08/10/21 1252 08/11/21 0431  AST 19 17  ALT 19 16  ALKPHOS 81 64  BILITOT 0.7 1.6*  PROT 6.5 5.5*  ALBUMIN 3.4* 2.6*   Recent Labs    06/27/21 0334 06/28/21 0323 08/10/21 1252 08/11/21 0024 08/11/21 0431 08/12/21 0400 02/26/22 1149  WBC 12.1*   < > 7.1   < > 5.2 4.8 12.0*  NEUTROABS 9.5*  --  6.0  --   --   --  8.6*  HGB 8.8*   < > 5.8*   < > 8.1* 8.1* 10.2*  HCT 26.7*   < > 17.2*   < > 23.4* 23.6* 30.6*  MCV 92.7   < > 92.5   < > 90.0 90.1 88.7  PLT 213   < > 166   < > 178 187 433*   < > = values in this interval not displayed.   Lab Results  Component Value Date   TSH 1.355 03/06/2020   No results found for: "HGBA1C" Lab Results  Component Value Date   CHOL 120 11/07/2020   HDL 61 11/07/2020   LDLCALC 52 11/07/2020   TRIG 37 11/07/2020   CHOLHDL 2.0 11/07/2020    Significant Diagnostic  Results in last 30 days:  No results found.  Assessment/Plan 1. Chronic diastolic CHF (congestive heart failure) ([place]) 3. Chronic obstructive pulmonary disease, unspecified COPD type ([place]) 4. Sleep apnea, unspecified typ 5. Hypoxia Chronic use of oxygen. Lungs clear today. Compliant with use of sleep apnea machine. Continue to  monitor for symptoms.  - continue torsemide 40 mg daily.  - Continue prn albuterol - Continue   2. Hemorrhoids, unspecified hemorrhoid type Patient has had manageable stools - encourage high fiber diet to help with normal consistency of stool.   6. Secondary hyperparathyroidism of renal origin [facility]) Continue with monitoring with quarterly labs.   7. Anemia in chronic kidney disease, unspecified CKD stage Hx of GIB Continue iron supplementation. Hgb has continued to downtrend. No desire for surgical interventions.  Hemoglobin  Date/Time Value Ref Range Status  02/26/2022 11:49 AM 10.2 (L) 13.0 - 17.0 g/dL Final   HGB  Date/Time Value Ref Range Status  03/19/2014 10:49 AM 12.8 (L) 13.0 - 18.0 g/dL Final   8. Chronic kidney disease, stage IV (severe) ([place]) Followed by nephrology. Last GFR 17 on labs.   Creatinine  Date/Time Value Ref Range Status  03/19/2014 10:49 AM 1.37 (H) 0.60 - 1.30 mg/dL Final   Creatinine, Ser  Date/Time Value Ref Range Status  08/11/2021 04:31 AM 2.20 (H) 0.61 - 1.24 mg/dL Final     9. Current mild episode of major depressive disorder, unspecified whether recurrent (HCC) Mood stable on 100 mg sertraline daily. Continue to monitor symptoms.   10. Nocturia associated with benign prostatic hyperplasia Persistent issue. Stable. CTM. Continue flomax 0.4 mg daily.   11. Hypertension Continue amlodipine 5 mg daily. Goal to be 135/80 based on renal function. Typically at goal. Will monitor for persistently elevated blood pressures.   Family/ staff Communication: family, nursing  Labs/tests ordered:  Monthly CBC, quarterly renal labs.   Tomasa Rand, MD, [facility] [phone]

## 2022-06-25 ENCOUNTER — Other Ambulatory Visit: Payer: Self-pay | Admitting: Oncology

## 2022-06-28 MED FILL — Iron Sucrose Inj 20 MG/ML (Fe Equiv): INTRAVENOUS | Qty: 10 | Status: AC

## 2022-07-01 ENCOUNTER — Inpatient Hospital Stay: Payer: Medicare PPO

## 2022-07-01 ENCOUNTER — Inpatient Hospital Stay: Payer: Medicare PPO | Attending: Oncology

## 2022-07-01 ENCOUNTER — Encounter: Payer: Self-pay | Admitting: Oncology

## 2022-07-01 ENCOUNTER — Inpatient Hospital Stay (HOSPITAL_BASED_OUTPATIENT_CLINIC_OR_DEPARTMENT_OTHER): Payer: Medicare PPO | Admitting: Oncology

## 2022-07-01 VITALS — BP 122/71 | HR 88 | Temp 97.1°F | Wt 202.0 lb

## 2022-07-01 DIAGNOSIS — I129 Hypertensive chronic kidney disease with stage 1 through stage 4 chronic kidney disease, or unspecified chronic kidney disease: Secondary | ICD-10-CM | POA: Diagnosis not present

## 2022-07-01 DIAGNOSIS — R5383 Other fatigue: Secondary | ICD-10-CM | POA: Insufficient documentation

## 2022-07-01 DIAGNOSIS — D75839 Thrombocytosis, unspecified: Secondary | ICD-10-CM

## 2022-07-01 DIAGNOSIS — Z91030 Bee allergy status: Secondary | ICD-10-CM | POA: Diagnosis not present

## 2022-07-01 DIAGNOSIS — Z8719 Personal history of other diseases of the digestive system: Secondary | ICD-10-CM | POA: Diagnosis not present

## 2022-07-01 DIAGNOSIS — G4730 Sleep apnea, unspecified: Secondary | ICD-10-CM | POA: Diagnosis not present

## 2022-07-01 DIAGNOSIS — Z7289 Other problems related to lifestyle: Secondary | ICD-10-CM | POA: Insufficient documentation

## 2022-07-01 DIAGNOSIS — Z8673 Personal history of transient ischemic attack (TIA), and cerebral infarction without residual deficits: Secondary | ICD-10-CM | POA: Diagnosis not present

## 2022-07-01 DIAGNOSIS — K921 Melena: Secondary | ICD-10-CM | POA: Insufficient documentation

## 2022-07-01 DIAGNOSIS — Z880 Allergy status to penicillin: Secondary | ICD-10-CM | POA: Insufficient documentation

## 2022-07-01 DIAGNOSIS — D631 Anemia in chronic kidney disease: Secondary | ICD-10-CM | POA: Insufficient documentation

## 2022-07-01 DIAGNOSIS — Z79899 Other long term (current) drug therapy: Secondary | ICD-10-CM | POA: Diagnosis not present

## 2022-07-01 DIAGNOSIS — Z9049 Acquired absence of other specified parts of digestive tract: Secondary | ICD-10-CM | POA: Insufficient documentation

## 2022-07-01 DIAGNOSIS — N184 Chronic kidney disease, stage 4 (severe): Secondary | ICD-10-CM | POA: Insufficient documentation

## 2022-07-01 DIAGNOSIS — D72828 Other elevated white blood cell count: Secondary | ICD-10-CM

## 2022-07-01 DIAGNOSIS — Z825 Family history of asthma and other chronic lower respiratory diseases: Secondary | ICD-10-CM | POA: Insufficient documentation

## 2022-07-01 DIAGNOSIS — Z8249 Family history of ischemic heart disease and other diseases of the circulatory system: Secondary | ICD-10-CM | POA: Diagnosis not present

## 2022-07-01 DIAGNOSIS — J449 Chronic obstructive pulmonary disease, unspecified: Secondary | ICD-10-CM | POA: Insufficient documentation

## 2022-07-01 DIAGNOSIS — Z8546 Personal history of malignant neoplasm of prostate: Secondary | ICD-10-CM | POA: Diagnosis not present

## 2022-07-01 DIAGNOSIS — N189 Chronic kidney disease, unspecified: Secondary | ICD-10-CM

## 2022-07-01 DIAGNOSIS — Z823 Family history of stroke: Secondary | ICD-10-CM | POA: Insufficient documentation

## 2022-07-01 DIAGNOSIS — Z87891 Personal history of nicotine dependence: Secondary | ICD-10-CM | POA: Insufficient documentation

## 2022-07-01 LAB — IRON AND TIBC
Iron: 34 ug/dL — ABNORMAL LOW (ref 45–182)
Saturation Ratios: 15 % — ABNORMAL LOW (ref 17.9–39.5)
TIBC: 231 ug/dL — ABNORMAL LOW (ref 250–450)
UIBC: 197 ug/dL

## 2022-07-01 LAB — CBC WITH DIFFERENTIAL/PLATELET
Abs Immature Granulocytes: 0.66 10*3/uL — ABNORMAL HIGH (ref 0.00–0.07)
Basophils Absolute: 0.1 10*3/uL (ref 0.0–0.1)
Basophils Relative: 1 %
Eosinophils Absolute: 0.1 10*3/uL (ref 0.0–0.5)
Eosinophils Relative: 1 %
HCT: 26.5 % — ABNORMAL LOW (ref 39.0–52.0)
Hemoglobin: 8.8 g/dL — ABNORMAL LOW (ref 13.0–17.0)
Immature Granulocytes: 4 %
Lymphocytes Relative: 6 %
Lymphs Abs: 1 10*3/uL (ref 0.7–4.0)
MCH: 31.7 pg (ref 26.0–34.0)
MCHC: 33.2 g/dL (ref 30.0–36.0)
MCV: 95.3 fL (ref 80.0–100.0)
Monocytes Absolute: 1.8 10*3/uL — ABNORMAL HIGH (ref 0.1–1.0)
Monocytes Relative: 11 %
Neutro Abs: 12.5 10*3/uL — ABNORMAL HIGH (ref 1.7–7.7)
Neutrophils Relative %: 77 %
Platelets: 453 10*3/uL — ABNORMAL HIGH (ref 150–400)
RBC: 2.78 MIL/uL — ABNORMAL LOW (ref 4.22–5.81)
RDW: 15.8 % — ABNORMAL HIGH (ref 11.5–15.5)
WBC: 16.1 10*3/uL — ABNORMAL HIGH (ref 4.0–10.5)
nRBC: 0 % (ref 0.0–0.2)

## 2022-07-01 LAB — RETIC PANEL
Immature Retic Fract: 16.1 % — ABNORMAL HIGH (ref 2.3–15.9)
RBC.: 2.84 MIL/uL — ABNORMAL LOW (ref 4.22–5.81)
Retic Count, Absolute: 81.2 10*3/uL (ref 19.0–186.0)
Retic Ct Pct: 2.9 % (ref 0.4–3.1)
Reticulocyte Hemoglobin: 32.2 pg (ref >27.9)

## 2022-07-01 LAB — FERRITIN: Ferritin: 261 ng/mL (ref 24–336)

## 2022-07-01 NOTE — Assessment & Plan Note (Addendum)
Likely anemia secondary to chronic kidney disease. Protein electrophoresis was checked 2 to 3 years ago and was negative. Acute drop of hemoglobin of 8.8, dropped from 10s.  Iron panel resulted after his visit. Ferritin >200, iron saturation 15. Recommend IV venofer weekly x 2.   I recommend patient to start erythropoietin therapy, Retacrit 20,000 units Q2 weeks.  Rationale and potential risks were reviewed and discussed with patient and his daughter.  They agree with the plan.

## 2022-07-01 NOTE — Progress Notes (Signed)
Hematology/Oncology Consult note [phone] [phone]      Patient Care Team: Dewayne Shorter, MD as PCP - General (Family Medicine) Minna Merritts, MD as Consulting Physician (Cardiology)  ASSESSMENT & PLAN:   Anemia in chronic kidney disease Likely anemia secondary to chronic kidney disease. Protein electrophoresis was checked 2 to 3 years ago and was negative. Acute drop of hemoglobin of 8.8, dropped from 10s.  Iron panel resulted after his visit. Ferritin >200, iron saturation 15. Recommend IV venofer weekly x 2.   I recommend patient to start erythropoietin therapy, Retacrit 20,000 units Q2 weeks.  Rationale and potential risks were reviewed and discussed with patient and his daughter.  They agree with the plan.    Orders Placed This Encounter  Procedures   Hemoglobin and Hematocrit, Blood    Standing Status:   Standing    Number of Occurrences:   20    Standing Expiration Date:   07/02/2023   Ferritin    Standing Status:   Future    Standing Expiration Date:   12/30/2022   Iron and TIBC    Standing Status:   Future    Standing Expiration Date:   07/02/2023   CBC with Differential/Platelet    Standing Status:   Future    Standing Expiration Date:   07/02/2023   Retic Panel    Standing Status:   Future    Standing Expiration Date:   07/02/2023   Follow up per LOS  All questions were answered. The patient knows to call the clinic with any problems, questions or concerns. We spent sufficient time to discuss many aspect of care, questions were answered to patient's satisfaction. A total of 25 minutes was spent on this visit.  With 5 minutes spent reviewing lab results,  15 minutes counseling the patient on the diagnosis, IV Venofer and Retacrit treatments, side effects of the treatment.  Additional 5 minutes was spent on answering patient's questions.  Earlie Server, MD, PhD [facility] Hematology Oncology 07/01/2022    CHIEF  COMPLAINTS/REASON FOR VISIT:  Anemia  HISTORY OF PRESENTING ILLNESS:  Trevor Ferguson is a  88 y.o.  male with PMH listed below who was referred to me for anemia Reviewed patient's recent labs that was done.  He was found to have abnormal CBC on 10/24/2021 with a hemoglobin 9.9, WBC 11, hematocrit 30, MCV 90.9. 01/17/2022 hemoglobin 8.9 Per nephrology note. Reviewed patient's previous labs ordered by primary care physician's office, anemia is chronic onset  Patient has chronic kidney disease, stage IV. He has chronic shortness of breath, nasal cannula oxygen.+ Fatigue Appetite is not good because food does not taste good. He denies recent chest pain on exertion,pre-syncopal episodes, or palpitations He had not noticed any recent bleeding such as epistaxis, hematuria or hematochezia.  Occasionally, patient notices blood in the stool. His last colonoscopy was in 2022.  Patient has hemorrhoids. He denies any pica and eats a variety of diet.   Patient lives in [facility] Patient has a history of prostate cancer.   INTERVAL HISTORY Trevor Ferguson is a 88 y.o. male who has above history reviewed by me today presents for follow up visit for anemia.  + fatigue.  He take oral iron supplementation, + dark stool, no bright red blood in stool. Patient was accompanied by her daughter today.   MEDICAL HISTORY:  Past Medical History:  Diagnosis Date   Acute GI bleeding    COPD (chronic obstructive pulmonary disease) ([place])  Diverticulitis    GI bleed 01/26/2021   Hypertension    Pneumonia    2012,1985 hospitalized   Prostate cancer [facility])    with seed placement   Pulmonary edema    Rectal bleeding    Sleep apnea    noncompliant, dx 1996   Stroke ([place]) 08/19/2010   no residuals    SURGICAL HISTORY: Past Surgical History:  Procedure Laterality Date   adenoid     APPENDECTOMY     BOWEL RESECTION N/A 06/22/2021   Procedure: SMALL BOWEL RESECTION;  Surgeon: Herbert Pun, MD;   Location: [facility] ORS;  Service: General;  Laterality: N/A;   COLONOSCOPY WITH PROPOFOL N/A 01/28/2021   Procedure: COLONOSCOPY WITH PROPOFOL;  Surgeon: Lucilla Lame, MD;  Location: [facility] ENDOSCOPY;  Service: Endoscopy;  Laterality: N/A;  GI bleed   ENTEROSCOPY N/A 01/30/2021   Procedure: ENTEROSCOPY;  Surgeon: Lin Landsman, MD;  Location: [facility] ENDOSCOPY;  Service: Gastroenterology;  Laterality: N/A;   ESOPHAGOGASTRODUODENOSCOPY (EGD) WITH PROPOFOL N/A 01/27/2021   Procedure: ESOPHAGOGASTRODUODENOSCOPY (EGD) WITH PROPOFOL;  Surgeon: Lucilla Lame, MD;  Location: [facility] ENDOSCOPY;  Service: Endoscopy;  Laterality: N/A;   GIVENS CAPSULE STUDY N/A 01/28/2021   Procedure: GIVENS CAPSULE STUDY;  Surgeon: Lin Landsman, MD;  Location: [facility] ENDOSCOPY;  Service: Gastroenterology;  Laterality: N/A;   HEMORRHOID SURGERY     LAPAROTOMY N/A 06/22/2021   Procedure: EXPLORATORY LAPAROTOMY;  Surgeon: Herbert Pun, MD;  Location: [facility] ORS;  Service: General;  Laterality: N/A;   spine cyst     tailbone surgery   TONSILLECTOMY      SOCIAL HISTORY: Social History   Socioeconomic History   Marital status: Married    Spouse name: Not on file   Number of children: Not on file   Years of education: Not on file   Highest education level: Not on file  Occupational History   Not on file  Tobacco Use   Smoking status: Former    Packs/day: 1.50    Years: 40.00    Total pack years: 60.00    Types: Cigarettes, Pipe    Quit date: 08/19/1985    Years since quitting: 36.8   Smokeless tobacco: Never  Vaping Use   Vaping Use: Never used  Substance and Sexual Activity   Alcohol use: Yes    Alcohol/week: 14.0 standard drinks of alcohol    Types: 14 Shots of liquor per week    Comment: occasional   Drug use: No   Sexual activity: Not on file  Other Topics Concern   Not on file  Social History Narrative   Live with your wife at [facility], walks independently, no current  oxygen   Social Determinants of Health   Financial Resource Strain: Low Risk  (07/17/2018)   Overall Financial Resource Strain (CARDIA)    Difficulty of Paying Living Expenses: Not hard at all  Food Insecurity: No Food Insecurity (07/17/2018)   Hunger Vital Sign    Worried About Running Out of Food in the Last Year: Never true    Ran Out of Food in the Last Year: Never true  Transportation Needs: No Transportation Needs (07/17/2018)   PRAPARE - Hydrologist (Medical): No    Lack of Transportation (Non-Medical): No  Physical Activity: Inactive (07/17/2018)   Exercise Vital Sign    Days of Exercise per Week: 0 days    Minutes of Exercise per Session: 0 min  Stress: No Stress Concern Present (07/17/2018)  [place] Questionnaire    Feeling of Stress : Not at all  Social Connections: Socially Integrated (07/17/2018)   Social Connection and Isolation Panel [NHANES]    Frequency of Communication with Friends and Family: Three times a week    Frequency of Social Gatherings with Friends and Family: Three times a week    Attends Religious Services: More than 4 times per year    Active Member of Clubs or Organizations: Yes    Attends Archivist Meetings: More than 4 times per year    Marital Status: Married  Human resources officer Violence: Not At Risk (07/17/2018)   Humiliation, Afraid, Rape, and Kick questionnaire    Fear of Current or Ex-Partner: No    Emotionally Abused: No    Physically Abused: No    Sexually Abused: No    FAMILY HISTORY: Family History  Problem Relation Age of Onset   Congestive Heart Failure Mother    COPD Sister    Asthma Sister    Heart attack Sister    Tuberculosis Maternal Grandmother    Stroke Maternal Grandfather     ALLERGIES:  is allergic to penicillins, bee venom, and darvon [propoxyphene].  MEDICATIONS:  Current Outpatient Medications  Medication Sig  Dispense Refill   acetaminophen (TYLENOL) 325 MG tablet Take 2 tablets (650 mg total) by mouth every 6 (six) hours as needed for mild pain (or Fever >/= 101).     albuterol (ACCUNEB) 0.63 MG/3ML nebulizer solution Take 1 ampule by nebulization every 8 (eight) hours as needed for wheezing or shortness of breath.     albuterol (VENTOLIN HFA) 108 (90 Base) MCG/ACT inhaler Inhale 2 puffs into the lungs every 6 (six) hours as needed for wheezing or shortness of breath.     amLODipine (NORVASC) 5 MG tablet Take 1 tablet (5 mg total) by mouth daily.     ascorbic acid (VITAMIN C) 500 MG tablet Take 500 mg by mouth daily.     carbamide peroxide (DEBROX) 6.5 % OTIC solution Place 5 drops into both ears daily as needed.     cetaphil (CETAPHIL) lotion Apply 1 Application topically 2 (two) times daily. Day and evening shift apply to bilateral legs for dry skin after triamcinolone     Cholecalciferol (VITAMIN D-3) 25 MCG (1000 UT) CAPS Take 1,000 Units by mouth daily.      diclofenac Sodium (VOLTAREN) 1 % GEL Apply to bilateral shoulders/neck every morning and bedtime.     famotidine (PEPCID) 10 MG tablet Take 1 tablet (10 mg total) by mouth 2 (two) times daily.     ferrous sulfate 325 (65 FE) MG tablet Take 1 tablet (325 mg total) by mouth daily with breakfast.  3   fluticasone (FLONASE) 50 MCG/ACT nasal spray Place 2 sprays into both nostrils daily as needed.     Lactobacillus (ACIDOPHILUS) CAPS capsule Take 1 capsule by mouth 3 (three) times daily. For prophylaxis supplement     lactose free nutrition (BOOST) LIQD Take 237 mLs by mouth every 12 (twelve) hours as needed (for weight managment).     loratadine (CLARITIN) 10 MG tablet Take 10 mg by mouth daily.     melatonin 3 MG TABS tablet Take 3 mg by mouth at bedtime. may have one '3mg'$  tab per request in addition to routine '5mg'$ .     melatonin 5 MG TABS Take 5 mg by mouth at bedtime.     omeprazole (PRILOSEC) 40 MG capsule Take 40  mg by mouth daily.      oxyCODONE-acetaminophen (PERCOCET/ROXICET) 5-325 MG tablet Take 1 tablet by mouth every 6 (six) hours as needed for moderate pain. 10 tablet 0   OXYGEN Inhale 2 L into the lungs as directed. Every shift for COPD and SOB     sertraline (ZOLOFT) 100 MG tablet Take 100 mg by mouth daily.     simethicone (MYLICON) 80 MG chewable tablet Chew 80 mg by mouth every 4 (four) hours as needed for flatulence (or upset stomach).     tamsulosin (FLOMAX) 0.4 MG CAPS capsule Take 0.4 mg by mouth at bedtime.     torsemide (DEMADEX) 20 MG tablet Take 40 mg by mouth daily. And 1 tablet by mouth every 72 hours as needed for increased edema     traZODone (DESYREL) 100 MG tablet Take 100 mg by mouth at bedtime.     triamcinolone cream (KENALOG) 0.1 % Apply 1 application topically 2 (two) times daily.     fluorouracil (EFUDEX) 5 % cream Apply 1 Application topically as directed. Apply to lesion top of head topically every day and evening shift 14 days on and 7 days off for Neoplasm of uncertain behavior (Patient not taking: Reported on 07/01/2022)     No current facility-administered medications for this visit.    Review of Systems  Constitutional:  Positive for appetite change and fatigue. Negative for chills and fever.  HENT:   Negative for hearing loss and voice change.   Eyes:  Negative for eye problems and icterus.  Respiratory:  Negative for chest tightness, cough and shortness of breath.   Cardiovascular:  Negative for chest pain and leg swelling.  Gastrointestinal:  Negative for abdominal distention and abdominal pain.       + dark stool  Endocrine: Negative for hot flashes.  Genitourinary:  Negative for difficulty urinating, dysuria and frequency.   Musculoskeletal:  Negative for arthralgias.  Skin:  Negative for itching and rash.  Neurological:  Negative for light-headedness and numbness.  Hematological:  Negative for adenopathy. Does not bruise/bleed easily.  Psychiatric/Behavioral:  Negative for  confusion.     PHYSICAL EXAMINATION: ECOG PERFORMANCE STATUS: 2 - Symptomatic, <50% confined to bed Vitals:   07/01/22 1438  BP: 122/71  Pulse: 88  Temp: (!) 97.1 F (36.2 C)  SpO2: 95%   Filed Weights   07/01/22 1438  Weight: 202 lb (91.6 kg)    Physical Exam Constitutional:      General: He is not in acute distress.    Appearance: He is obese.     Comments: Frail appearance elderly male sitting in the wheelchair.  HENT:     Head: Normocephalic and atraumatic.  Eyes:     General: No scleral icterus. Cardiovascular:     Rate and Rhythm: Normal rate and regular rhythm.     Heart sounds: Normal heart sounds.  Pulmonary:     Effort: Pulmonary effort is normal. No respiratory distress.     Breath sounds: No wheezing.     Comments: Significant decreased breath sound bilaterally Abdominal:     General: There is no distension.  Musculoskeletal:        General: No deformity. Normal range of motion.     Cervical back: Normal range of motion and neck supple.  Skin:    General: Skin is warm and dry.     Findings: No erythema or rash.  Neurological:     Mental Status: He is alert and oriented to person, place,  and time. Mental status is at baseline.     Cranial Nerves: No cranial nerve deficit.     Coordination: Coordination normal.  Psychiatric:        Mood and Affect: Mood normal.      LABORATORY DATA:  I have reviewed the data as listed     Latest Ref Rng & Units 07/01/2022    2:23 PM 02/26/2022   11:49 AM 08/12/2021    4:00 AM  CBC  WBC 4.0 - 10.5 K/uL 16.1  12.0  4.8   Hemoglobin 13.0 - 17.0 g/dL 8.8  10.2  8.1   Hematocrit 39.0 - 52.0 % 26.5  30.6  23.6   Platelets 150 - 400 K/uL 453  433  187       Latest Ref Rng & Units 08/11/2021    4:31 AM 08/11/2021   12:24 AM 08/10/2021   12:52 PM  CMP  Glucose 70 - 99 mg/dL 143   109   BUN 8 - 23 mg/dL 39   38   Creatinine 0.61 - 1.24 mg/dL 2.20  2.38  2.46   Sodium 135 - 145 mmol/L 134   136   Potassium 3.5  - 5.1 mmol/L 3.7   4.2   Chloride 98 - 111 mmol/L 99   99   CO2 22 - 32 mmol/L 27   28   Calcium 8.9 - 10.3 mg/dL 8.3   8.6   Total Protein 6.5 - 8.1 g/dL 5.5   6.5   Total Bilirubin 0.3 - 1.2 mg/dL 1.6   0.7   Alkaline Phos 38 - 126 U/L 64   81   AST 15 - 41 U/L 17   19   ALT 0 - 44 U/L 16   19        RADIOGRAPHIC STUDIES: I have personally reviewed the radiological images as listed and agreed with the findings in the report. No results found.

## 2022-07-02 ENCOUNTER — Telehealth: Payer: Self-pay

## 2022-07-02 NOTE — Telephone Encounter (Signed)
Unable to reach pt by phone, detailed VM left on Kathy's ph and mychart message sent.   Please schedule and inform pt of appt:   Venofer weekly x2 (starting this week) Retacrit the day after venofer this week. (They can't be done on the same day)  Lab/ retacrit in 2 weeks, 4 weeks and 6 weeks   Lab/MD/ retacrit in 2 months

## 2022-07-02 NOTE — Telephone Encounter (Signed)
-----   Message from Earlie Server, MD sent at 07/01/2022 11:44 PM EST ----- Please let patient and daughter know that his iron level shows borderline low iron saturation, I recommend IV venofer weekly x 2. Also please arrange him to start Retacrit later this week, after he receives Venofer.  Labs in 2 weeks, 4 weeks 6 weeks +/- retacrit Follow up in 2 months, lab MD retacrit. Labs are ordered.   zy

## 2022-07-03 ENCOUNTER — Encounter: Payer: Self-pay | Admitting: Oncology

## 2022-07-03 ENCOUNTER — Inpatient Hospital Stay: Payer: Medicare PPO

## 2022-07-03 VITALS — BP 131/57 | HR 78 | Temp 97.2°F | Resp 18

## 2022-07-03 DIAGNOSIS — D0461 Carcinoma in situ of skin of right upper limb, including shoulder: Secondary | ICD-10-CM | POA: Diagnosis not present

## 2022-07-03 DIAGNOSIS — G4730 Sleep apnea, unspecified: Secondary | ICD-10-CM | POA: Diagnosis not present

## 2022-07-03 DIAGNOSIS — Z7289 Other problems related to lifestyle: Secondary | ICD-10-CM | POA: Diagnosis not present

## 2022-07-03 DIAGNOSIS — J449 Chronic obstructive pulmonary disease, unspecified: Secondary | ICD-10-CM | POA: Diagnosis not present

## 2022-07-03 DIAGNOSIS — N184 Chronic kidney disease, stage 4 (severe): Secondary | ICD-10-CM | POA: Diagnosis not present

## 2022-07-03 DIAGNOSIS — L814 Other melanin hyperpigmentation: Secondary | ICD-10-CM | POA: Diagnosis not present

## 2022-07-03 DIAGNOSIS — I129 Hypertensive chronic kidney disease with stage 1 through stage 4 chronic kidney disease, or unspecified chronic kidney disease: Secondary | ICD-10-CM | POA: Diagnosis not present

## 2022-07-03 DIAGNOSIS — K921 Melena: Secondary | ICD-10-CM | POA: Diagnosis not present

## 2022-07-03 DIAGNOSIS — D492 Neoplasm of unspecified behavior of bone, soft tissue, and skin: Secondary | ICD-10-CM | POA: Diagnosis not present

## 2022-07-03 DIAGNOSIS — N189 Chronic kidney disease, unspecified: Secondary | ICD-10-CM

## 2022-07-03 DIAGNOSIS — R5383 Other fatigue: Secondary | ICD-10-CM | POA: Diagnosis not present

## 2022-07-03 DIAGNOSIS — L570 Actinic keratosis: Secondary | ICD-10-CM | POA: Diagnosis not present

## 2022-07-03 DIAGNOSIS — Z79899 Other long term (current) drug therapy: Secondary | ICD-10-CM | POA: Diagnosis not present

## 2022-07-03 DIAGNOSIS — D631 Anemia in chronic kidney disease: Secondary | ICD-10-CM | POA: Diagnosis not present

## 2022-07-03 MED ORDER — SODIUM CHLORIDE 0.9 % IV SOLN
200.0000 mg | Freq: Once | INTRAVENOUS | Status: AC
  Administered 2022-07-03: 200 mg via INTRAVENOUS
  Filled 2022-07-03: qty 200

## 2022-07-03 MED ORDER — SODIUM CHLORIDE 0.9 % IV SOLN
Freq: Once | INTRAVENOUS | Status: AC
  Filled 2022-07-03: qty 250

## 2022-07-03 NOTE — Progress Notes (Signed)
1325: pt reports after the MD visit 2 days ago that he developed a cough which has led to chest pain. He describes it as aching rated a 4 out of 10 (worse when hecough) reports mild congestion, coughing up "clear/cloudy" phlegm.  Pt reporst pain in entire chest "from coughing".  B/P 132/57 HR 97, O2 97 % on 3 liter Nasal canula. pt has SOB but reports this as WNL. Per Dr. Tasia Catchings proceed with Venofer as ordered at this time and pt to contact PCP for flu/covid testing. Pt and daughter verbalizes understanding and report they will speak with MD at [facility]. Pt and daughter educated to seek care if symptoms worsen.   1442: Pt tolerated treatment well. Pt and VS stable at discharge.

## 2022-07-04 ENCOUNTER — Non-Acute Institutional Stay (SKILLED_NURSING_FACILITY): Payer: Medicare PPO | Admitting: Nurse Practitioner

## 2022-07-04 ENCOUNTER — Encounter: Payer: Self-pay | Admitting: Nurse Practitioner

## 2022-07-04 ENCOUNTER — Inpatient Hospital Stay: Payer: Medicare PPO

## 2022-07-04 VITALS — BP 107/62

## 2022-07-04 DIAGNOSIS — D631 Anemia in chronic kidney disease: Secondary | ICD-10-CM | POA: Diagnosis not present

## 2022-07-04 DIAGNOSIS — N184 Chronic kidney disease, stage 4 (severe): Secondary | ICD-10-CM | POA: Diagnosis not present

## 2022-07-04 DIAGNOSIS — K921 Melena: Secondary | ICD-10-CM | POA: Diagnosis not present

## 2022-07-04 DIAGNOSIS — Z7289 Other problems related to lifestyle: Secondary | ICD-10-CM | POA: Diagnosis not present

## 2022-07-04 DIAGNOSIS — G4730 Sleep apnea, unspecified: Secondary | ICD-10-CM | POA: Diagnosis not present

## 2022-07-04 DIAGNOSIS — J449 Chronic obstructive pulmonary disease, unspecified: Secondary | ICD-10-CM | POA: Diagnosis not present

## 2022-07-04 DIAGNOSIS — R110 Nausea: Secondary | ICD-10-CM

## 2022-07-04 DIAGNOSIS — I129 Hypertensive chronic kidney disease with stage 1 through stage 4 chronic kidney disease, or unspecified chronic kidney disease: Secondary | ICD-10-CM | POA: Diagnosis not present

## 2022-07-04 DIAGNOSIS — R051 Acute cough: Secondary | ICD-10-CM | POA: Diagnosis not present

## 2022-07-04 DIAGNOSIS — Z79899 Other long term (current) drug therapy: Secondary | ICD-10-CM | POA: Diagnosis not present

## 2022-07-04 DIAGNOSIS — R5383 Other fatigue: Secondary | ICD-10-CM | POA: Diagnosis not present

## 2022-07-04 MED ORDER — EPOETIN ALFA-EPBX 20000 UNIT/ML IJ SOLN
20000.0000 [IU] | Freq: Once | INTRAMUSCULAR | Status: AC
  Administered 2022-07-04: 20000 [IU] via SUBCUTANEOUS
  Filled 2022-07-04: qty 1

## 2022-07-04 NOTE — Progress Notes (Signed)
Location:  Other Nursing Home Room Number: 086 Place of Service:   SNF  Dewayne Shorter, MD  Patient Care Team: Dewayne Shorter, MD as PCP - General (Family Medicine) Rockey Situ, Kathlene November, MD as Consulting Physician (Cardiology)  Extended Emergency Contact Information Primary Emergency Contact: Chatfield,Kathryn Address: [id number]          [place], [place] [address] Johnnette Litter of [employer] Phone: [phone] Relation: Daughter Secondary Emergency Contact: [facility] Address: [address]          [place], [place] [address] Johnnette Litter of [place] Phone: [phone] Mobile Phone: [phone] Relation: Spouse  Goals of care: Advanced Directive information    07/01/2022    2:51 PM  Advanced Directives  Does Patient Have a Medical Advance Directive? Yes  Type of Paramedic of [place];Living will     Chief Complaint  Patient presents with   Acute Visit    Nausea for 1 month    HPI:  Pt is a 88 y.o. male seen today for an acute visit for nausea.  Report he has been having nausea off and on for 1 month.  Generally after he eats dinner but last night happened at dinner and vomiting in the dinning room.  No fever or chills.  He has not had this problem in the past.  He has regular BM and currently not having any nausea Weight has been stable. No weight loss.  Stomach tender when he coughs- has had a cough over the last few days  No worsening shortness of breath or congestion. He is having a worsening cough.  Cough medication helps for a while but then reoccurs   Had iron infusion today.   Has to get up frequently to use the urinal at night and this disrupts sleep.   Past Medical History:  Diagnosis Date   Acute GI bleeding    COPD (chronic obstructive pulmonary disease) ([place])    Diverticulitis    GI bleed 01/26/2021   Hypertension    Pneumonia    2012,1985 hospitalized   Prostate cancer ([place])    with seed  placement   Pulmonary edema    Rectal bleeding    Sleep apnea    noncompliant, dx 1996   Stroke ([place]) 08/19/2010   no residuals   Past Surgical History:  Procedure Laterality Date   adenoid     APPENDECTOMY     BOWEL RESECTION N/A 06/22/2021   Procedure: SMALL BOWEL RESECTION;  Surgeon: Herbert Pun, MD;  Location: [facility] ORS;  Service: General;  Laterality: N/A;   COLONOSCOPY WITH PROPOFOL N/A 01/28/2021   Procedure: COLONOSCOPY WITH PROPOFOL;  Surgeon: Lucilla Lame, MD;  Location: [facility] ENDOSCOPY;  Service: Endoscopy;  Laterality: N/A;  GI bleed   ENTEROSCOPY N/A 01/30/2021   Procedure: ENTEROSCOPY;  Surgeon: Lin Landsman, MD;  Location: [facility] ENDOSCOPY;  Service: Gastroenterology;  Laterality: N/A;   ESOPHAGOGASTRODUODENOSCOPY (EGD) WITH PROPOFOL N/A 01/27/2021   Procedure: ESOPHAGOGASTRODUODENOSCOPY (EGD) WITH PROPOFOL;  Surgeon: Lucilla Lame, MD;  Location: [facility] ENDOSCOPY;  Service: Endoscopy;  Laterality: N/A;   GIVENS CAPSULE STUDY N/A 01/28/2021   Procedure: GIVENS CAPSULE STUDY;  Surgeon: Lin Landsman, MD;  Location: [facility] ENDOSCOPY;  Service: Gastroenterology;  Laterality: N/A;   HEMORRHOID SURGERY     LAPAROTOMY N/A 06/22/2021   Procedure: EXPLORATORY LAPAROTOMY;  Surgeon: Herbert Pun, MD;  Location: [facility] ORS;  Service: General;  Laterality: N/A;   spine cyst     tailbone surgery   TONSILLECTOMY  Allergies  Allergen Reactions   Penicillins Hives, Rash and Other (See Comments)    Rash on hands and blisters on ankles when 88 years old Patient tolerates Zosyn   Bee Venom Palpitations    INSECT BITES/STINGS   Darvon [Propoxyphene] Nausea Only and Rash    Outpatient Encounter Medications as of 07/04/2022  Medication Sig   Acetaminophen (CHLORASEPTIC SORE THROAT PO) Take 1 spray by mouth every 2 (two) hours as needed.   acetaminophen (TYLENOL) 325 MG tablet Take 2 tablets (650 mg total) by mouth every 6 (six) hours as needed for mild  pain (or Fever >/= 101).   albuterol (ACCUNEB) 0.63 MG/3ML nebulizer solution Take 1 ampule by nebulization every 8 (eight) hours as needed for wheezing or shortness of breath.   albuterol (VENTOLIN HFA) 108 (90 Base) MCG/ACT inhaler Inhale 2 puffs into the lungs every 6 (six) hours as needed for wheezing or shortness of breath.   amLODipine (NORVASC) 5 MG tablet Take 1 tablet (5 mg total) by mouth daily.   ascorbic acid (VITAMIN C) 500 MG tablet Take 500 mg by mouth daily.   carbamide peroxide (DEBROX) 6.5 % OTIC solution Place 5 drops into both ears daily as needed.   cetaphil (CETAPHIL) lotion Apply 1 Application topically 2 (two) times daily. Day and evening shift apply to bilateral legs for dry skin after triamcinolone   Cholecalciferol (VITAMIN D-3) 25 MCG (1000 UT) CAPS Take 1,000 Units by mouth daily.    dextromethorphan-guaiFENesin (ROBITUSSIN-DM) 10-100 MG/5ML liquid Take 10 mLs by mouth every 4 (four) hours as needed for cough.   diclofenac Sodium (VOLTAREN) 1 % GEL Apply to bilateral shoulders/neck every morning and bedtime.   famotidine (PEPCID) 10 MG tablet Take 1 tablet (10 mg total) by mouth 2 (two) times daily.   ferrous sulfate 325 (65 FE) MG tablet Take 1 tablet (325 mg total) by mouth daily with breakfast.   fluorouracil (EFUDEX) 5 % cream Apply 1 Application topically as directed. Apply to lesion top of head topically every day and evening shift 14 days on and 7 days off for Neoplasm of uncertain behavior   fluticasone (FLONASE) 50 MCG/ACT nasal spray Place 2 sprays into both nostrils daily as needed.   Lactobacillus (ACIDOPHILUS) CAPS capsule Take 1 capsule by mouth 3 (three) times daily. For prophylaxis supplement   lactose free nutrition (BOOST) LIQD Take 237 mLs by mouth every 12 (twelve) hours as needed (for weight managment).   loratadine (CLARITIN) 10 MG tablet Take 10 mg by mouth daily.   melatonin 3 MG TABS tablet Take 3 mg by mouth at bedtime. may have one '3mg'$  tab  per request in addition to routine '5mg'$ .   melatonin 5 MG TABS Take 5 mg by mouth at bedtime.   omeprazole (PRILOSEC) 40 MG capsule Take 40 mg by mouth daily.   ondansetron (ZOFRAN) 4 MG tablet Take 4 mg by mouth every 8 (eight) hours as needed for nausea or vomiting.   oxyCODONE-acetaminophen (PERCOCET/ROXICET) 5-325 MG tablet Take 1 tablet by mouth every 6 (six) hours as needed for moderate pain.   OXYGEN Inhale 2 L into the lungs as directed. Every shift for COPD and SOB   sertraline (ZOLOFT) 100 MG tablet Take 100 mg by mouth daily.   simethicone (MYLICON) 80 MG chewable tablet Chew 80 mg by mouth every 4 (four) hours as needed for flatulence (or upset stomach).   tamsulosin (FLOMAX) 0.4 MG CAPS capsule Take 0.4 mg by mouth at bedtime.  torsemide (DEMADEX) 20 MG tablet Take 40 mg by mouth daily. And 1 tablet by mouth every 72 hours as needed for increased edema   traZODone (DESYREL) 100 MG tablet Take 100 mg by mouth at bedtime.   triamcinolone cream (KENALOG) 0.1 % Apply 1 application topically 2 (two) times daily.   No facility-administered encounter medications on file as of 07/04/2022.    Review of Systems  Constitutional:  Negative for activity change, appetite change, fatigue and unexpected weight change.  HENT:  Negative for congestion and hearing loss.   Eyes: Negative.   Respiratory:  Positive for cough. Negative for shortness of breath.   Cardiovascular:  Negative for chest pain, palpitations and leg swelling.  Gastrointestinal:  Positive for nausea (comes and goes) and vomiting (episode yesterday x1). Negative for abdominal pain, constipation and diarrhea.  Genitourinary:  Negative for difficulty urinating and dysuria.  Musculoskeletal:  Negative for arthralgias and myalgias.  Skin:  Negative for color change and wound.  Neurological:  Negative for dizziness and weakness.  Psychiatric/Behavioral:  Negative for agitation, behavioral problems and confusion.     Immunization  History  Administered Date(s) Administered   Fluad Quad(high Dose 65+) 06/07/2019   Influenza, High Dose Seasonal PF 07/19/2018, 06/04/2022   Influenza-Unspecified 06/18/2012, 06/03/2013, 05/19/2014, 06/21/2014, 04/13/2015, 06/11/2016, 05/23/2017   Pneumococcal Conjugate-13 09/11/2015   Pneumococcal-Unspecified 08/19/1998   Tdap 11/29/2004, 10/05/2014   Zoster, Live 10/28/2007   Pertinent  Health Maintenance Due  Topic Date Due   INFLUENZA VACCINE  Completed      08/10/2021   10:59 PM 08/11/2021    2:00 AM 08/11/2021    8:45 AM 08/11/2021    8:10 PM 02/26/2022   10:55 AM  Fall Risk  Patient Fall Risk Level High fall risk High fall risk High fall risk High fall risk High fall risk   Functional Status Survey:    Vitals:   07/04/22 1324  BP: (!) 136/58  Pulse: 72  Resp: 18  Temp: 97.7 F (36.5 C)  SpO2: 95%  Weight: 202 lb 8 oz (91.9 kg)  Height: 5' 8.5" (1.74 m)   Body mass index is 30.34 kg/m. Physical Exam  Labs reviewed: Recent Labs    07/18/21 0534 08/10/21 1252 08/11/21 0024 08/11/21 0431  NA 135 136  --  134*  K 3.9 4.2  --  3.7  CL 97* 99  --  99  CO2 31 28  --  27  GLUCOSE 138* 109*  --  143*  BUN 100* 38*  --  39*  CREATININE 2.09* 2.46* 2.38* 2.20*  CALCIUM 9.1 8.6*  --  8.3*  MG  --   --   --  1.5*  PHOS  --   --   --  4.5   Recent Labs    08/10/21 1252 08/11/21 0431  AST 19 17  ALT 19 16  ALKPHOS 81 64  BILITOT 0.7 1.6*  PROT 6.5 5.5*  ALBUMIN 3.4* 2.6*   Recent Labs    08/10/21 1252 08/11/21 0024 08/12/21 0400 02/26/22 1149 07/01/22 1423  WBC 7.1   < > 4.8 12.0* 16.1*  NEUTROABS 6.0  --   --  8.6* 12.5*  HGB 5.8*   < > 8.1* 10.2* 8.8*  HCT 17.2*   < > 23.6* 30.6* 26.5*  MCV 92.5   < > 90.1 88.7 95.3  PLT 166   < > 187 433* 453*   < > = values in this interval not displayed.   Lab  Results  Component Value Date   TSH 1.355 03/06/2020   No results found for: "HGBA1C" Lab Results  Component Value Date   CHOL 120  11/07/2020   HDL 61 11/07/2020   LDLCALC 52 11/07/2020   TRIG 37 11/07/2020   CHOLHDL 2.0 11/07/2020    Significant Diagnostic Results in last 30 days:  No results found.  Assessment/Plan 1. Acute cough -add mucinex DM by mouth twice daily for 7 days to help with cough- to notify for worsening of symptoms  2. Nausea Nausea on and off- occasionally will have vomiting with nausea.  -generally nausea in the evening.  -no fever, chills, diarrhea, constipation or abdominal pain -?too much acid reducers, will stop famotidine (not having any symptoms of GERD) and monitor.  -GI referral if continues   Breezy Hertenstein K. [place], [facility] [phone]

## 2022-07-05 ENCOUNTER — Telehealth: Payer: Self-pay | Admitting: Orthopedic Surgery

## 2022-07-05 DIAGNOSIS — K5901 Slow transit constipation: Secondary | ICD-10-CM

## 2022-07-05 DIAGNOSIS — R109 Unspecified abdominal pain: Secondary | ICD-10-CM | POA: Diagnosis not present

## 2022-07-05 DIAGNOSIS — J189 Pneumonia, unspecified organism: Secondary | ICD-10-CM

## 2022-07-05 DIAGNOSIS — R0602 Shortness of breath: Secondary | ICD-10-CM | POA: Diagnosis not present

## 2022-07-05 DIAGNOSIS — R059 Cough, unspecified: Secondary | ICD-10-CM | POA: Diagnosis not present

## 2022-07-05 DIAGNOSIS — R112 Nausea with vomiting, unspecified: Secondary | ICD-10-CM | POA: Diagnosis not present

## 2022-07-05 MED ORDER — DOXYCYCLINE HYCLATE 100 MG PO TABS: 100.0000 mg | ORAL_TABLET | Freq: Two times a day (BID) | ORAL | 0 refills | Status: AC

## 2022-07-05 MED ORDER — SENNA 8.6 MG PO TABS: 2.0000 | ORAL_TABLET | Freq: Every day | ORAL | 0 refills | Status: AC

## 2022-07-05 MED ORDER — POLYETHYLENE GLYCOL 3350 17 GM/SCOOP PO POWD: 17.0000 g | Freq: Every day | ORAL | 1 refills | Status: AC

## 2022-07-05 NOTE — Telephone Encounter (Signed)
Nurse from [facility] calling to report CXR and KUB results. CXR reveals moderate pneumonia to right middle lobe. KUB reveals slight amount of stool scattered throughout colon and rectum. Advised to start doxycycline 100 mg po BID x 7 days. Advised to start Miralax 17 g daily and senna 8.6 mg- give 2 tablets daily.

## 2022-07-08 ENCOUNTER — Inpatient Hospital Stay: Payer: Medicare PPO

## 2022-07-08 VITALS — BP 129/61 | HR 69 | Temp 97.8°F | Resp 16

## 2022-07-08 DIAGNOSIS — G4730 Sleep apnea, unspecified: Secondary | ICD-10-CM | POA: Diagnosis not present

## 2022-07-08 DIAGNOSIS — D631 Anemia in chronic kidney disease: Secondary | ICD-10-CM

## 2022-07-08 DIAGNOSIS — N184 Chronic kidney disease, stage 4 (severe): Secondary | ICD-10-CM | POA: Diagnosis not present

## 2022-07-08 DIAGNOSIS — I129 Hypertensive chronic kidney disease with stage 1 through stage 4 chronic kidney disease, or unspecified chronic kidney disease: Secondary | ICD-10-CM | POA: Diagnosis not present

## 2022-07-08 DIAGNOSIS — R5383 Other fatigue: Secondary | ICD-10-CM | POA: Diagnosis not present

## 2022-07-08 DIAGNOSIS — J449 Chronic obstructive pulmonary disease, unspecified: Secondary | ICD-10-CM | POA: Diagnosis not present

## 2022-07-08 DIAGNOSIS — Z7289 Other problems related to lifestyle: Secondary | ICD-10-CM | POA: Diagnosis not present

## 2022-07-08 DIAGNOSIS — Z79899 Other long term (current) drug therapy: Secondary | ICD-10-CM | POA: Diagnosis not present

## 2022-07-08 DIAGNOSIS — K921 Melena: Secondary | ICD-10-CM | POA: Diagnosis not present

## 2022-07-08 MED ORDER — SODIUM CHLORIDE 0.9 % IV SOLN
Freq: Once | INTRAVENOUS | Status: AC
  Filled 2022-07-08: qty 250

## 2022-07-08 MED ORDER — SODIUM CHLORIDE 0.9 % IV SOLN
200.0000 mg | Freq: Once | INTRAVENOUS | Status: AC
  Administered 2022-07-08: 200 mg via INTRAVENOUS
  Filled 2022-07-08: qty 200

## 2022-07-09 DIAGNOSIS — I7091 Generalized atherosclerosis: Secondary | ICD-10-CM | POA: Diagnosis not present

## 2022-07-09 DIAGNOSIS — B351 Tinea unguium: Secondary | ICD-10-CM | POA: Diagnosis not present

## 2022-07-16 ENCOUNTER — Other Ambulatory Visit: Payer: Self-pay | Admitting: Oncology

## 2022-07-16 ENCOUNTER — Inpatient Hospital Stay: Payer: Medicare PPO

## 2022-07-16 VITALS — BP 137/51 | HR 71

## 2022-07-16 DIAGNOSIS — Z7289 Other problems related to lifestyle: Secondary | ICD-10-CM | POA: Diagnosis not present

## 2022-07-16 DIAGNOSIS — N184 Chronic kidney disease, stage 4 (severe): Secondary | ICD-10-CM

## 2022-07-16 DIAGNOSIS — D631 Anemia in chronic kidney disease: Secondary | ICD-10-CM | POA: Diagnosis not present

## 2022-07-16 DIAGNOSIS — I129 Hypertensive chronic kidney disease with stage 1 through stage 4 chronic kidney disease, or unspecified chronic kidney disease: Secondary | ICD-10-CM | POA: Diagnosis not present

## 2022-07-16 DIAGNOSIS — R5383 Other fatigue: Secondary | ICD-10-CM | POA: Diagnosis not present

## 2022-07-16 DIAGNOSIS — N189 Chronic kidney disease, unspecified: Secondary | ICD-10-CM

## 2022-07-16 DIAGNOSIS — Z79899 Other long term (current) drug therapy: Secondary | ICD-10-CM | POA: Diagnosis not present

## 2022-07-16 DIAGNOSIS — G4730 Sleep apnea, unspecified: Secondary | ICD-10-CM | POA: Diagnosis not present

## 2022-07-16 DIAGNOSIS — K921 Melena: Secondary | ICD-10-CM | POA: Diagnosis not present

## 2022-07-16 DIAGNOSIS — J449 Chronic obstructive pulmonary disease, unspecified: Secondary | ICD-10-CM | POA: Diagnosis not present

## 2022-07-16 LAB — CBC WITH DIFFERENTIAL/PLATELET
Abs Immature Granulocytes: 0.54 10*3/uL — ABNORMAL HIGH (ref 0.00–0.07)
Basophils Absolute: 0.1 10*3/uL (ref 0.0–0.1)
Basophils Relative: 1 %
Eosinophils Absolute: 0.1 10*3/uL (ref 0.0–0.5)
Eosinophils Relative: 1 %
HCT: 23.7 % — ABNORMAL LOW (ref 39.0–52.0)
Hemoglobin: 7.7 g/dL — ABNORMAL LOW (ref 13.0–17.0)
Immature Granulocytes: 6 %
Lymphocytes Relative: 7 %
Lymphs Abs: 0.7 10*3/uL (ref 0.7–4.0)
MCH: 31.4 pg (ref 26.0–34.0)
MCHC: 32.5 g/dL (ref 30.0–36.0)
MCV: 96.7 fL (ref 80.0–100.0)
Monocytes Absolute: 0.9 10*3/uL (ref 0.1–1.0)
Monocytes Relative: 10 %
Neutro Abs: 6.9 10*3/uL (ref 1.7–7.7)
Neutrophils Relative %: 75 %
Platelets: 431 10*3/uL — ABNORMAL HIGH (ref 150–400)
RBC: 2.45 MIL/uL — ABNORMAL LOW (ref 4.22–5.81)
RDW: 16.8 % — ABNORMAL HIGH (ref 11.5–15.5)
Smear Review: NORMAL
WBC: 9.1 10*3/uL (ref 4.0–10.5)
nRBC: 0 % (ref 0.0–0.2)

## 2022-07-16 LAB — RETIC PANEL
Immature Retic Fract: 20.8 % — ABNORMAL HIGH (ref 2.3–15.9)
RBC.: 2.48 MIL/uL — ABNORMAL LOW (ref 4.22–5.81)
Retic Count, Absolute: 78.9 10*3/uL (ref 19.0–186.0)
Retic Ct Pct: 3.2 % — ABNORMAL HIGH (ref 0.4–3.1)
Reticulocyte Hemoglobin: 30.9 pg (ref >27.9)

## 2022-07-16 LAB — IRON AND TIBC
Iron: 32 ug/dL — ABNORMAL LOW (ref 45–182)
Saturation Ratios: 18 % (ref 17.9–39.5)
TIBC: 181 ug/dL — ABNORMAL LOW (ref 250–450)
UIBC: 149 ug/dL

## 2022-07-16 LAB — FERRITIN: Ferritin: 488 ng/mL — ABNORMAL HIGH (ref 24–336)

## 2022-07-16 MED ORDER — EPOETIN ALFA-EPBX 20000 UNIT/ML IJ SOLN
20000.0000 [IU] | Freq: Once | INTRAMUSCULAR | Status: AC
  Administered 2022-07-16: 20000 [IU] via SUBCUTANEOUS
  Filled 2022-07-16: qty 1

## 2022-07-20 DIAGNOSIS — R0782 Intercostal pain: Secondary | ICD-10-CM | POA: Diagnosis not present

## 2022-07-22 ENCOUNTER — Encounter: Payer: Self-pay | Admitting: Student

## 2022-07-22 ENCOUNTER — Non-Acute Institutional Stay (SKILLED_NURSING_FACILITY): Payer: Medicare PPO | Admitting: Student

## 2022-07-22 DIAGNOSIS — R296 Repeated falls: Secondary | ICD-10-CM | POA: Diagnosis not present

## 2022-07-22 DIAGNOSIS — K922 Gastrointestinal hemorrhage, unspecified: Secondary | ICD-10-CM

## 2022-07-22 DIAGNOSIS — D649 Anemia, unspecified: Secondary | ICD-10-CM | POA: Diagnosis not present

## 2022-07-22 DIAGNOSIS — R0989 Other specified symptoms and signs involving the circulatory and respiratory systems: Secondary | ICD-10-CM | POA: Diagnosis not present

## 2022-07-22 DIAGNOSIS — R109 Unspecified abdominal pain: Secondary | ICD-10-CM | POA: Diagnosis not present

## 2022-07-22 DIAGNOSIS — I10 Essential (primary) hypertension: Secondary | ICD-10-CM | POA: Diagnosis not present

## 2022-07-22 DIAGNOSIS — K567 Ileus, unspecified: Secondary | ICD-10-CM | POA: Diagnosis not present

## 2022-07-22 DIAGNOSIS — J449 Chronic obstructive pulmonary disease, unspecified: Secondary | ICD-10-CM | POA: Diagnosis not present

## 2022-07-22 MED ORDER — MORPHINE SULFATE (CONCENTRATE) 20 MG/ML PO SOLN: 5.0000 mg | ORAL | 0 refills | Status: AC | PRN

## 2022-07-22 NOTE — Progress Notes (Signed)
Location:  Other [place].  Nursing Home Room Number: [place] of Service:  SNF [id number]) Provider:  Dr. Amada Kingfisher, MD  Patient Care Team: Dewayne Shorter, MD as PCP - General (Family Medicine) [name], MD as Consulting Physician (Cardiology)  Extended Emergency Contact Information Primary Emergency Contact: Chatfield,Kathryn Address: [phone] [address]          [place], [place] [address] Johnnette Litter of [place] Phone: [phone] Relation: Daughter Secondary Emergency Contact: [facility] Address: [address]          [place], [place] [address] Johnnette Litter of [place] Phone: [phone] Mobile Phone: [phone] Relation: Spouse  Code Status:  DNR Goals of care: Advanced Directive information    07/22/2022   10:38 AM  Advanced Directives  Does Patient Have a Medical Advance Directive? Yes  Type of Advance Directive Out of facility DNR (pink MOST or yellow form)  Does patient want to make changes to medical advance directive? No - Patient declined     Chief Complaint  Patient presents with   Acute Visit    Fall.     HPI:  Pt is a 88 y.o. male seen today for an acute visit after a fall and for concern of increased weakness. He fell this weekend. He was walking to his chair from turning on the light and lost his footing. He has felt short of breath, but feels it is his COPD. Denies chest pain. He states his last bowel movement was yesterday. He continues to have issues with night time urination. Nursing states that patients oxygen dropped to the 80s this morning and he had to wear a mask to bring his oxygen up to normal.   He gives his name, date of birth, states the days of the week backwards, the season, location, Month and year. He also knows it is Monday. He states he does not want to go to the hospital.  CBC pending. D/c tramadol, percocet CXR and KUB.   Discussed his status with his wife and  attempted to call his daughter, [name] (RP). Nursing states they spoke with patient who does not want to go to the hospital and daughter states she would like to respect his wishes.   Past Medical History:  Diagnosis Date   Acute GI bleeding    COPD (chronic obstructive pulmonary disease) ([place])    Diverticulitis    GI bleed 01/26/2021   Hypertension    Pneumonia    2012,1985 hospitalized   Prostate cancer ([place])    with seed placement   Pulmonary edema    Rectal bleeding    Sleep apnea    noncompliant, dx 1996   Stroke ([place]) 08/19/2010   no residuals   Past Surgical History:  Procedure Laterality Date   adenoid     APPENDECTOMY     BOWEL RESECTION N/A 06/22/2021   Procedure: SMALL BOWEL RESECTION;  Surgeon: Herbert Pun, MD;  Location: [facility] ORS;  Service: General;  Laterality: N/A;   COLONOSCOPY WITH PROPOFOL N/A 01/28/2021   Procedure: COLONOSCOPY WITH PROPOFOL;  Surgeon: Lucilla Lame, MD;  Location: [facility] ENDOSCOPY;  Service: Endoscopy;  Laterality: N/A;  GI bleed   ENTEROSCOPY N/A 01/30/2021   Procedure: ENTEROSCOPY;  Surgeon: Lin Landsman, MD;  Location[facility] ENDOSCOPY;  Service: Gastroenterology;  Laterality: N/A;   ESOPHAGOGASTRODUODENOSCOPY (EGD) WITH PROPOFOL N/A 01/27/2021   Procedure: ESOPHAGOGASTRODUODENOSCOPY (EGD) WITH PROPOFOL;  Surgeon: Lucilla Lame, MD;  Location: [facility] ENDOSCOPY;  Service: Endoscopy;  Laterality: N/A;   GIVENS CAPSULE STUDY N/A 01/28/2021   Procedure: GIVENS CAPSULE STUDY;  Surgeon: Lin Landsman, MD;  Location: [facility] ENDOSCOPY;  Service: Gastroenterology;  Laterality: N/A;   HEMORRHOID SURGERY     LAPAROTOMY N/A 06/22/2021   Procedure: EXPLORATORY LAPAROTOMY;  Surgeon: Herbert Pun, MD;  Location: [facility] ORS;  Service: General;  Laterality: N/A;   spine cyst     tailbone surgery   TONSILLECTOMY      Allergies  Allergen Reactions   Penicillins Hives, Rash and Other (See Comments)    Rash on hands and blisters on  ankles when 88 years old Patient tolerates Zosyn   Bee Venom Palpitations    INSECT BITES/STINGS   Darvon [Propoxyphene] Nausea Only and Rash    Outpatient Encounter Medications as of 07/22/2022  Medication Sig   Acetaminophen (CHLORASEPTIC SORE THROAT PO) Take 1 spray by mouth every 2 (two) hours as needed.   acetaminophen (TYLENOL) 325 MG tablet Take 2 tablets (650 mg total) by mouth every 6 (six) hours as needed for mild pain (or Fever >/= 101).   albuterol (ACCUNEB) 0.63 MG/3ML nebulizer solution Take 1 ampule by nebulization every 8 (eight) hours as needed for wheezing or shortness of breath.   albuterol (VENTOLIN HFA) 108 (90 Base) MCG/ACT inhaler Inhale 2 puffs into the lungs every 6 (six) hours as needed for wheezing or shortness of breath.   amLODipine (NORVASC) 5 MG tablet Take 1 tablet (5 mg total) by mouth daily.   ascorbic acid (VITAMIN C) 500 MG tablet Take 500 mg by mouth daily.   carbamide peroxide (DEBROX) 6.5 % OTIC solution Place 5 drops into both ears daily as needed.   cetaphil (CETAPHIL) lotion Apply 1 Application topically 2 (two) times daily. Day and evening shift apply to bilateral legs for dry skin after triamcinolone   Cholecalciferol (VITAMIN D-3) 25 MCG (1000 UT) CAPS Take 1,000 Units by mouth daily.    diclofenac Sodium (VOLTAREN) 1 % GEL Apply to bilateral shoulders/neck every morning and bedtime.   famotidine (PEPCID) 10 MG tablet Take 1 tablet (10 mg total) by mouth 2 (two) times daily.   fluticasone (FLONASE) 50 MCG/ACT nasal spray Place 2 sprays into both nostrils daily as needed.   Lactobacillus (ACIDOPHILUS) CAPS capsule Take 1 capsule by mouth 3 (three) times daily. For prophylaxis supplement   lactose free nutrition (BOOST) LIQD Take 237 mLs by mouth every 12 (twelve) hours as needed (for weight managment).   loratadine (CLARITIN) 10 MG tablet Take 10 mg by mouth daily.   melatonin 3 MG TABS tablet Take 3 mg by mouth at bedtime. may have one '3mg'$  tab per  request in addition to routine '5mg'$ .   melatonin 5 MG TABS Take 5 mg by mouth at bedtime.   omeprazole (PRILOSEC) 40 MG capsule Take 40 mg by mouth daily.   ondansetron (ZOFRAN) 4 MG tablet Take 4 mg by mouth every 8 (eight) hours as needed for nausea or vomiting.   oxyCODONE-acetaminophen (PERCOCET/ROXICET) 5-325 MG tablet Take 1 tablet by mouth every 6 (six) hours as needed for moderate pain.   OXYGEN Inhale 2 L into the lungs as directed. Every shift for COPD and SOB   polyethylene glycol powder (GLYCOLAX/MIRALAX) 17 GM/SCOOP powder Take 17 g by mouth daily.   senna (SENOKOT) 8.6 MG TABS tablet Take 2 tablets (17.2 mg total) by mouth daily.   sertraline (ZOLOFT) 100 MG tablet Take 100 mg by mouth daily.   simethicone (MYLICON) 80 MG  chewable tablet Chew 80 mg by mouth every 4 (four) hours as needed for flatulence (or upset stomach).   tamsulosin (FLOMAX) 0.4 MG CAPS capsule Take 0.4 mg by mouth at bedtime.   torsemide (DEMADEX) 20 MG tablet Take 40 mg by mouth daily. And 1 tablet by mouth every 72 hours as needed for increased edema   traMADol (ULTRAM) 50 MG tablet Take 50 mg by mouth every 6 (six) hours as needed.   triamcinolone cream (KENALOG) 0.1 % Apply 1 application topically 2 (two) times daily.   [DISCONTINUED] dextromethorphan-guaiFENesin (ROBITUSSIN-DM) 10-100 MG/5ML liquid Take 10 mLs by mouth every 4 (four) hours as needed for cough.   [DISCONTINUED] ferrous sulfate 325 (65 FE) MG tablet Take 1 tablet (325 mg total) by mouth daily with breakfast.   [DISCONTINUED] fluorouracil (EFUDEX) 5 % cream Apply 1 Application topically as directed. Apply to lesion top of head topically every day and evening shift 14 days on and 7 days off for Neoplasm of uncertain behavior   [DISCONTINUED] traZODone (DESYREL) 100 MG tablet Take 100 mg by mouth at bedtime.   No facility-administered encounter medications on file as of 07/22/2022.    Review of Systems  All other systems reviewed and are  negative.   Immunization History  Administered Date(s) Administered   Fluad Quad(high Dose 65+) 06/07/2019   Influenza, High Dose Seasonal PF 07/19/2018, 06/04/2022   Influenza-Unspecified 06/18/2012, 06/03/2013, 05/19/2014, 06/21/2014, 04/13/2015, 06/11/2016, 05/23/2017   Pneumococcal Conjugate-13 09/11/2015   Pneumococcal-Unspecified 08/19/1998   Tdap 11/29/2004, 10/05/2014   Zoster, Live 10/28/2007   Pertinent  Health Maintenance Due  Topic Date Due   INFLUENZA VACCINE  Completed      08/11/2021    2:00 AM 08/11/2021    8:45 AM 08/11/2021    8:10 PM 02/26/2022   10:55 AM 07/08/2022    2:45 PM  Fall Risk  Patient Fall Risk Level High fall risk High fall risk High fall risk High fall risk High fall risk   Functional Status Survey:    Vitals:   07/22/22 1027  BP: 104/60  Pulse: 87  Resp: 20  Temp: 97.7 F (36.5 C)  SpO2: 90%  Weight: 197 lb 4.8 oz (89.5 kg)  Height: 5' 8.5" (1.74 m)   Body mass index is 29.56 kg/m. Physical Exam HENT:     Nose:     Comments: 3LNC  Cardiovascular:     Rate and Rhythm: Normal rate.     Pulses: Normal pulses.  Pulmonary:     Breath sounds: Normal breath sounds.  Abdominal:     Comments: Distended, hard, nontender, sluggish bowel sounds  Skin:    Coloration: Skin is pale.  Neurological:     Mental Status: He is alert and oriented to person, place, and time.     Labs reviewed: Recent Labs    08/10/21 1252 08/11/21 0024 08/11/21 0431  NA 136  --  134*  K 4.2  --  3.7  CL 99  --  99  CO2 28  --  27  GLUCOSE 109*  --  143*  BUN 38*  --  39*  CREATININE 2.46* 2.38* 2.20*  CALCIUM 8.6*  --  8.3*  MG  --   --  1.5*  PHOS  --   --  4.5   Recent Labs    08/10/21 1252 08/11/21 0431  AST 19 17  ALT 19 16  ALKPHOS 81 64  BILITOT 0.7 1.6*  PROT 6.5 5.5*  ALBUMIN 3.4* 2.6*  Recent Labs    02/26/22 1149 07/01/22 1423 07/16/22 1052  WBC 12.0* 16.1* 9.1  NEUTROABS 8.6* 12.5* 6.9  HGB 10.2* 8.8* 7.7*  HCT  30.6* 26.5* 23.7*  MCV 88.7 95.3 96.7  PLT 433* 453* 431*   Lab Results  Component Value Date   TSH 1.355 03/06/2020   No results found for: "HGBA1C" Lab Results  Component Value Date   CHOL 120 11/07/2020   HDL 61 11/07/2020   LDLCALC 52 11/07/2020   TRIG 37 11/07/2020   CHOLHDL 2.0 11/07/2020    Significant Diagnostic Results in last 30 days:  No results found.  Assessment/Plan 1. Ileus ([place]) 2. Recurrent falls 3. Chronic obstructive pulmonary disease, unspecified COPD type ([place]) 4. Gastrointestinal hemorrhage, unspecified gastrointestinal hemorrhage type Patient with a fall this weekend on 12/1 and incurred rib pain. Denies cardiogenic symptoms, except potential progression of his underlying COPD. Patient is alert and oriented, however, has difficulty with prolonged attention. He is ill appearing and has significant abdominal distention. KUB ordered Stat today along with CXR, CXR unchanged, however, KUB shows an ileus. Concern that new use of tramadol contributed to his current presentation of ileus. Discussed possible suppository with nursing, however, the recommended treatment would be NGT and IVF. Discussed with patient prior to KUB results concern that he is very ill and I recommend he goes to the hospital and he states he does not want to go to the hospital. Unable to get in touch with HCPOA at this time, however, nursing was able to who state she would like to keep him at the facility to respect his wishes. Given this desire and concern for patient's potential decline, will order roxanol '5mg'$  q2 hrs prn for pain, SOB, air hunger. Of note, patient recently had stool sample positive for blood in his stool. He has had known GIB and we have monitored conservatively with monthly CBC. CBC is pending at this time. Offered words of comfort to family. Chaplain of facility states he would like to be available for the family Tami Lin is available any time at [phone].    Family/  staff Communication: Wife, attempt to call family members.   Labs/tests ordered:  CBC, KUB, CXR  Tomasa Rand, MD, [facility] [phone]

## 2022-07-23 ENCOUNTER — Encounter: Payer: Self-pay | Admitting: Nurse Practitioner

## 2022-07-23 ENCOUNTER — Inpatient Hospital Stay: Payer: Medicare PPO

## 2022-07-23 ENCOUNTER — Non-Acute Institutional Stay (SKILLED_NURSING_FACILITY): Payer: Medicare PPO | Admitting: Nurse Practitioner

## 2022-07-23 DIAGNOSIS — D72829 Elevated white blood cell count, unspecified: Secondary | ICD-10-CM | POA: Diagnosis not present

## 2022-07-23 DIAGNOSIS — Z515 Encounter for palliative care: Secondary | ICD-10-CM | POA: Diagnosis not present

## 2022-07-23 DIAGNOSIS — K567 Ileus, unspecified: Secondary | ICD-10-CM

## 2022-07-23 NOTE — Progress Notes (Signed)
Location:  Other Nursing Home Room Number: [place] of Service:  SNF [phone]) Provider:  Sherrie Mustache, NP    Patient Care Team: Dewayne Shorter, MD as PCP - General (Family Medicine) Rockey Situ, Kathlene November, MD as Consulting Physician (Cardiology)  Extended Emergency Contact Information Primary Emergency Contact: Chatfield,Kathryn Address: [phone] [address]          [place], [place] [address] Johnnette Litter of [place] Phone: [phone] Relation: Daughter Secondary Emergency Contact: [facility] Address: [address]          [place], [place] [address] Johnnette Litter of [place] Phone: [phone] Mobile Phone: [phone] Relation: Spouse  Code Status:  DNR Goals of care: Advanced Directive information    07/22/2022   10:38 AM  Advanced Directives  Does Patient Have a Medical Advance Directive? Yes  Type of Advance Directive Out of facility DNR (pink MOST or yellow form)  Does patient want to make changes to medical advance directive? No - Patient declined     Chief Complaint  Patient presents with   Acute Visit    Patient is being seen for elevated white blood count, and to discuss goals or care/treatment    HPI:  Pt is a 88 y.o. male seen today for an acute visit for follow up labs and goals of care.  Lab work revealed significant elevation in WBC. Pt continues to be lethargic with lower abdominal tenderness. Ileus noted on imaging done yesterday. Pt has made it clear that he does not wish to be hospitalized. Wife at bedside. Pt reports he is comfortable at this time and no pain.  He is having some jerking movements but pt reports they do not bother him or make him uncomfortable in any way.    Past Medical History:  Diagnosis Date   Acute GI bleeding    COPD (chronic obstructive pulmonary disease) ([place])    Diverticulitis    GI bleed 01/26/2021   Hypertension    Pneumonia    2012,1985 hospitalized   Prostate cancer ([place])     with seed placement   Pulmonary edema    Rectal bleeding    Sleep apnea    noncompliant, dx 1996   Stroke ([place]) 08/19/2010   no residuals   Past Surgical History:  Procedure Laterality Date   adenoid     APPENDECTOMY     BOWEL RESECTION N/A 06/22/2021   Procedure: SMALL BOWEL RESECTION;  Surgeon: Herbert Pun, MD;  Location: [facility] ORS;  Service: General;  Laterality: N/A;   COLONOSCOPY WITH PROPOFOL N/A 01/28/2021   Procedure: COLONOSCOPY WITH PROPOFOL;  Surgeon: Lucilla Lame, MD;  Location: [facility] ENDOSCOPY;  Service: Endoscopy;  Laterality: N/A;  GI bleed   ENTEROSCOPY N/A 01/30/2021   Procedure: ENTEROSCOPY;  Surgeon: Lin Landsman, MD;  Location: [facility] ENDOSCOPY;  Service: Gastroenterology;  Laterality: N/A;   ESOPHAGOGASTRODUODENOSCOPY (EGD) WITH PROPOFOL N/A 01/27/2021   Procedure: ESOPHAGOGASTRODUODENOSCOPY (EGD) WITH PROPOFOL;  Surgeon: Lucilla Lame, MD;  Location: [facility] ENDOSCOPY;  Service: Endoscopy;  Laterality: N/A;   GIVENS CAPSULE STUDY N/A 01/28/2021   Procedure: GIVENS CAPSULE STUDY;  Surgeon: Lin Landsman, MD;  Location: [facility] ENDOSCOPY;  Service: Gastroenterology;  Laterality: N/A;   HEMORRHOID SURGERY     LAPAROTOMY N/A 06/22/2021   Procedure: EXPLORATORY LAPAROTOMY;  Surgeon: Herbert Pun, MD;  Location: [facility] ORS;  Service: General;  Laterality: N/A;   spine cyst     tailbone surgery   TONSILLECTOMY      Allergies  Allergen  Reactions   Penicillins Hives, Rash and Other (See Comments)    Rash on hands and blisters on ankles when 88 years old Patient tolerates Zosyn   Bee Venom Palpitations    INSECT BITES/STINGS   Darvon [Propoxyphene] Nausea Only and Rash    Outpatient Encounter Medications as of 07/23/2022  Medication Sig   Acetaminophen (CHLORASEPTIC SORE THROAT PO) Take 1 spray by mouth every 2 (two) hours as needed.   acetaminophen (TYLENOL) 325 MG tablet Take 2 tablets (650 mg total) by mouth every 6 (six) hours as needed for  mild pain (or Fever >/= 101).   albuterol (ACCUNEB) 0.63 MG/3ML nebulizer solution Take 1 ampule by nebulization every 8 (eight) hours as needed for wheezing or shortness of breath.   albuterol (VENTOLIN HFA) 108 (90 Base) MCG/ACT inhaler Inhale 2 puffs into the lungs every 6 (six) hours as needed for wheezing or shortness of breath.   amLODipine (NORVASC) 5 MG tablet Take 1 tablet (5 mg total) by mouth daily.   ascorbic acid (VITAMIN C) 500 MG tablet Take 500 mg by mouth daily.   carbamide peroxide (DEBROX) 6.5 % OTIC solution Place 5 drops into both ears daily as needed.   cetaphil (CETAPHIL) lotion Apply 1 Application topically 2 (two) times daily. Day and evening shift apply to bilateral legs for dry skin after triamcinolone   Cholecalciferol (VITAMIN D-3) 25 MCG (1000 UT) CAPS Take 1,000 Units by mouth daily.    diclofenac Sodium (VOLTAREN) 1 % GEL Apply to bilateral shoulders/neck every morning and bedtime.   famotidine (PEPCID) 10 MG tablet Take 1 tablet (10 mg total) by mouth 2 (two) times daily.   fluticasone (FLONASE) 50 MCG/ACT nasal spray Place 2 sprays into both nostrils daily as needed.   Lactobacillus (ACIDOPHILUS) CAPS capsule Take 1 capsule by mouth 3 (three) times daily. For prophylaxis supplement   lactose free nutrition (BOOST) LIQD Take 237 mLs by mouth every 12 (twelve) hours as needed (for weight managment).   loratadine (CLARITIN) 10 MG tablet Take 10 mg by mouth daily.   melatonin 3 MG TABS tablet Take 3 mg by mouth at bedtime. may have one '3mg'$  tab per request in addition to routine '5mg'$ .   melatonin 5 MG TABS Take 5 mg by mouth at bedtime.   morphine (ROXANOL) 20 MG/ML concentrated solution Take 0.25 mLs (5 mg total) by mouth every 2 (two) hours as needed for severe pain.   omeprazole (PRILOSEC) 40 MG capsule Take 40 mg by mouth daily.   ondansetron (ZOFRAN) 4 MG tablet Take 4 mg by mouth every 8 (eight) hours as needed for nausea or vomiting.   OXYGEN Inhale 2 L into the  lungs as directed. Every shift for COPD and SOB   polyethylene glycol powder (GLYCOLAX/MIRALAX) 17 GM/SCOOP powder Take 17 g by mouth daily.   senna (SENOKOT) 8.6 MG TABS tablet Take 2 tablets (17.2 mg total) by mouth daily.   sertraline (ZOLOFT) 100 MG tablet Take 100 mg by mouth daily.   simethicone (MYLICON) 80 MG chewable tablet Chew 80 mg by mouth every 4 (four) hours as needed for flatulence (or upset stomach).   tamsulosin (FLOMAX) 0.4 MG CAPS capsule Take 0.4 mg by mouth at bedtime.   torsemide (DEMADEX) 20 MG tablet Take 40 mg by mouth daily. And 1 tablet by mouth every 72 hours as needed for increased edema   triamcinolone cream (KENALOG) 0.1 % Apply 1 application topically 2 (two) times daily.   No facility-administered encounter  medications on file as of 07/23/2022.    Review of Systems  Constitutional:  Positive for activity change and fatigue. Negative for appetite change and unexpected weight change.  HENT:  Negative for congestion and hearing loss.   Eyes: Negative.   Respiratory:  Negative for cough and shortness of breath.   Cardiovascular:  Negative for chest pain, palpitations and leg swelling.  Gastrointestinal:  Positive for abdominal pain. Negative for constipation and diarrhea.  Genitourinary:  Negative for difficulty urinating and dysuria.  Musculoskeletal:  Negative for arthralgias and myalgias.  Skin:  Negative for color change and wound.  Neurological:  Positive for weakness.  Psychiatric/Behavioral:  Positive for confusion (moments of confusion but still very lucid). Negative for agitation and behavioral problems.     Immunization History  Administered Date(s) Administered   Fluad Quad(high Dose 65+) 06/07/2019   Influenza, High Dose Seasonal PF 07/19/2018, 06/04/2022   Influenza-Unspecified 06/18/2012, 06/03/2013, 05/19/2014, 06/21/2014, 04/13/2015, 06/11/2016, 05/23/2017   PFIZER(Purple Top)SARS-COV-2 Vaccination 06/28/2022   Pneumococcal Conjugate-13  09/11/2015   Pneumococcal-Unspecified 08/19/1998   Tdap 11/29/2004, 10/05/2014   Zoster, Live 10/28/2007   Pertinent  Health Maintenance Due  Topic Date Due   INFLUENZA VACCINE  Completed      08/11/2021    2:00 AM 08/11/2021    8:45 AM 08/11/2021    8:10 PM 02/26/2022   10:55 AM 07/08/2022    2:45 PM  Fall Risk  Patient Fall Risk Level High fall risk High fall risk High fall risk High fall risk High fall risk   Functional Status Survey:    Vitals:   07/23/22 1538  BP: 117/62  Pulse: 90  Resp: 12  Temp: 97.7 F (36.5 C)  SpO2: (!) 85%  Weight: 197 lb 4.8 oz (89.5 kg)  Height: 5' 8.5" (1.74 m)   Body mass index is 29.56 kg/m. Physical Exam Constitutional:      General: He is not in acute distress.    Appearance: He is well-developed. He is not diaphoretic.  HENT:     Head: Normocephalic and atraumatic.     Right Ear: External ear normal.     Left Ear: External ear normal.     Mouth/Throat:     Pharynx: No oropharyngeal exudate.  Eyes:     Conjunctiva/sclera: Conjunctivae normal.     Pupils: Pupils are equal, round, and reactive to light.  Cardiovascular:     Rate and Rhythm: Normal rate and regular rhythm.     Heart sounds: Normal heart sounds.  Pulmonary:     Effort: Pulmonary effort is normal.     Breath sounds: Normal breath sounds.  Abdominal:     General: Bowel sounds are normal.     Palpations: Abdomen is soft.     Tenderness: There is abdominal tenderness.  Musculoskeletal:        General: No tenderness.     Cervical back: Normal range of motion and neck supple.     Right lower leg: No edema.     Left lower leg: No edema.  Skin:    General: Skin is warm and dry.  Neurological:     Mental Status: He is alert and oriented to person, place, and time.     Labs reviewed: Recent Labs    08/10/21 1252 08/11/21 0024 08/11/21 0431  NA 136  --  134*  K 4.2  --  3.7  CL 99  --  99  CO2 28  --  27  GLUCOSE 109*  --  143*  BUN 38*  --  39*   CREATININE 2.46* 2.38* 2.20*  CALCIUM 8.6*  --  8.3*  MG  --   --  1.5*  PHOS  --   --  4.5   Recent Labs    08/10/21 1252 08/11/21 0431  AST 19 17  ALT 19 16  ALKPHOS 81 64  BILITOT 0.7 1.6*  PROT 6.5 5.5*  ALBUMIN 3.4* 2.6*   Recent Labs    02/26/22 1149 07/01/22 1423 07/16/22 1052  WBC 12.0* 16.1* 9.1  NEUTROABS 8.6* 12.5* 6.9  HGB 10.2* 8.8* 7.7*  HCT 30.6* 26.5* 23.7*  MCV 88.7 95.3 96.7  PLT 433* 453* 431*   Lab Results  Component Value Date   TSH 1.355 03/06/2020   No results found for: "HGBA1C" Lab Results  Component Value Date   CHOL 120 11/07/2020   HDL 61 11/07/2020   LDLCALC 52 11/07/2020   TRIG 37 11/07/2020   CHOLHDL 2.0 11/07/2020    Significant Diagnostic Results in last 30 days:  No results found.  Assessment/Plan 1. Ileus ([place]) -noted on imaging, he does not want hospitalization or aggressive care  2. Leukocytosis, unspecified type -noted on labs, discussed with pt and called and updated daughter regarding findings.   3. End of life care Spoke with daughter, pt and his wife on the findings of ileus with elevated wbc, pt is having increase lethargy and periods of confusion. Will transition to hospice services at this time to keep patient comfortable at end of life.   Carlos American. [place], [facility] [phone]

## 2022-07-24 ENCOUNTER — Non-Acute Institutional Stay (SKILLED_NURSING_FACILITY): Payer: Medicare PPO | Admitting: Student

## 2022-07-24 ENCOUNTER — Encounter: Payer: Self-pay | Admitting: Student

## 2022-07-24 DIAGNOSIS — Z515 Encounter for palliative care: Secondary | ICD-10-CM | POA: Diagnosis not present

## 2022-07-24 DIAGNOSIS — K567 Ileus, unspecified: Secondary | ICD-10-CM | POA: Diagnosis not present

## 2022-07-24 DIAGNOSIS — J449 Chronic obstructive pulmonary disease, unspecified: Secondary | ICD-10-CM | POA: Diagnosis not present

## 2022-07-24 DIAGNOSIS — F05 Delirium due to known physiological condition: Secondary | ICD-10-CM

## 2022-07-24 MED ORDER — LORAZEPAM 0.5 MG PO TABS: 0.5000 mg | ORAL_TABLET | Freq: Three times a day (TID) | ORAL | 0 refills | Status: AC

## 2022-07-30 ENCOUNTER — Inpatient Hospital Stay: Payer: Medicare PPO

## 2022-08-13 ENCOUNTER — Inpatient Hospital Stay: Payer: Medicare PPO

## 2022-08-19 NOTE — Progress Notes (Signed)
Location:  Other [facility] Room Number: [place] of Service:  SNF [phone]) Provider:  Dr. Amada Kingfisher, MD  Patient Care Team: Dewayne Shorter, MD as PCP - General (Family Medicine) [name], MD as Consulting Physician (Cardiology)  Extended Emergency Contact Information Primary Emergency Contact: Chatfield,Kathryn Address: [phone] [address]          [place], [place] [address] Johnnette Litter of [place] Phone: [phone] Relation: Daughter Secondary Emergency Contact: [facility] Address: [address]          [place], [place] [address] Johnnette Litter of [place] Phone: [phone] Mobile Phone: [phone] Relation: Spouse  Code Status:  DNR Goals of care: Advanced Directive information    Jul 28, 2022    2:50 PM  Advanced Directives  Does Patient Have a Medical Advance Directive? Yes  Type of Advance Directive Out of facility DNR (pink MOST or yellow form)  Does patient want to make changes to medical advance directive? No - Patient declined     Chief Complaint  Patient presents with   Acute Visit    Hospice Care.     HPI:  Pt is a 89 y.o. male seen today for an acute visit for increased agitation. Patient seen today after Hospice consultation yesterday.   Patient is groaning and keeps his eyes closed. He is unable to lay comfortably in the bed.     Past Medical History:  Diagnosis Date   Acute GI bleeding    COPD (chronic obstructive pulmonary disease) ([place])    Diverticulitis    GI bleed 01/26/2021   Hypertension    Pneumonia    2012,1985 hospitalized   Prostate cancer ([place])    with seed placement   Pulmonary edema    Rectal bleeding    Sleep apnea    noncompliant, dx 1996   Stroke ([place]) 08/19/2010   no residuals   Past Surgical History:  Procedure Laterality Date   adenoid     APPENDECTOMY     BOWEL RESECTION N/A 06/22/2021   Procedure: SMALL BOWEL RESECTION;  Surgeon:  Herbert Pun, MD;  Location: [facility] ORS;  Service: General;  Laterality: N/A;   COLONOSCOPY WITH PROPOFOL N/A 01/28/2021   Procedure: COLONOSCOPY WITH PROPOFOL;  Surgeon: Lucilla Lame, MD;  Location: [facility] ENDOSCOPY;  Service: Endoscopy;  Laterality: N/A;  GI bleed   ENTEROSCOPY N/A 01/30/2021   Procedure: ENTEROSCOPY;  Surgeon: Lin Landsman, MD;  Location: [facility] ENDOSCOPY;  Service: Gastroenterology;  Laterality: N/A;   ESOPHAGOGASTRODUODENOSCOPY (EGD) WITH PROPOFOL N/A 01/27/2021   Procedure: ESOPHAGOGASTRODUODENOSCOPY (EGD) WITH PROPOFOL;  Surgeon: Lucilla Lame, MD;  Location: [facility] ENDOSCOPY;  Service: Endoscopy;  Laterality: N/A;   GIVENS CAPSULE STUDY N/A 01/28/2021   Procedure: GIVENS CAPSULE STUDY;  Surgeon: Lin Landsman, MD;  Location: [facility] ENDOSCOPY;  Service: Gastroenterology;  Laterality: N/A;   HEMORRHOID SURGERY     LAPAROTOMY N/A 06/22/2021   Procedure: EXPLORATORY LAPAROTOMY;  Surgeon: Herbert Pun, MD;  Location: [facility] ORS;  Service: General;  Laterality: N/A;   spine cyst     tailbone surgery   TONSILLECTOMY      Allergies  Allergen Reactions   Penicillins Hives, Rash and Other (See Comments)    Rash on hands and blisters on ankles when 89 years old Patient tolerates Zosyn   Bee Venom Palpitations    INSECT BITES/STINGS   Darvon [Propoxyphene] Nausea Only and Rash    Outpatient Encounter Medications as of 07-28-2022  Medication Sig  Acetaminophen (CHLORASEPTIC SORE THROAT PO) Take 1 spray by mouth every 2 (two) hours as needed.   acetaminophen (TYLENOL) 325 MG tablet Take 2 tablets (650 mg total) by mouth every 6 (six) hours as needed for mild pain (or Fever >/= 101).   albuterol (ACCUNEB) 0.63 MG/3ML nebulizer solution Take 1 ampule by nebulization every 8 (eight) hours as needed for wheezing or shortness of breath.   albuterol (VENTOLIN HFA) 108 (90 Base) MCG/ACT inhaler Inhale 2 puffs into the lungs every 6 (six) hours as needed for  wheezing or shortness of breath.   carbamide peroxide (DEBROX) 6.5 % OTIC solution Place 5 drops into both ears daily as needed.   cetaphil (CETAPHIL) lotion Apply 1 Application topically 2 (two) times daily. Day and evening shift apply to bilateral legs for dry skin after triamcinolone   diclofenac Sodium (VOLTAREN) 1 % GEL Apply to bilateral shoulders/neck every morning and bedtime.   famotidine (PEPCID) 10 MG tablet Take 1 tablet (10 mg total) by mouth 2 (two) times daily.   lactose free nutrition (BOOST) LIQD Take 237 mLs by mouth every 12 (twelve) hours as needed (for weight managment).   LORazepam (ATIVAN) 0.5 MG tablet Take 0.5 mg by mouth every 8 (eight) hours.   morphine (ROXANOL) 20 MG/ML concentrated solution Take 0.25 mLs (5 mg total) by mouth every 2 (two) hours as needed for severe pain.   omeprazole (PRILOSEC) 40 MG capsule Take 40 mg by mouth daily.   ondansetron (ZOFRAN) 4 MG tablet Take 4 mg by mouth every 8 (eight) hours as needed for nausea or vomiting.   OXYGEN Inhale 2 L into the lungs as directed. Every shift for COPD and SOB   polyethylene glycol powder (GLYCOLAX/MIRALAX) 17 GM/SCOOP powder Take 17 g by mouth daily.   senna (SENOKOT) 8.6 MG TABS tablet Take 2 tablets (17.2 mg total) by mouth daily.   sertraline (ZOLOFT) 100 MG tablet Take 100 mg by mouth daily.   simethicone (MYLICON) 80 MG chewable tablet Chew 80 mg by mouth every 4 (four) hours as needed for flatulence (or upset stomach).   tamsulosin (FLOMAX) 0.4 MG CAPS capsule Take 0.4 mg by mouth at bedtime.   triamcinolone cream (KENALOG) 0.1 % Apply 1 application topically 2 (two) times daily.   [DISCONTINUED] amLODipine (NORVASC) 5 MG tablet Take 1 tablet (5 mg total) by mouth daily.   [DISCONTINUED] ascorbic acid (VITAMIN C) 500 MG tablet Take 500 mg by mouth daily.   [DISCONTINUED] Cholecalciferol (VITAMIN D-3) 25 MCG (1000 UT) CAPS Take 1,000 Units by mouth daily.    [DISCONTINUED] fluticasone (FLONASE) 50  MCG/ACT nasal spray Place 2 sprays into both nostrils daily as needed.   [DISCONTINUED] Lactobacillus (ACIDOPHILUS) CAPS capsule Take 1 capsule by mouth 3 (three) times daily. For prophylaxis supplement   [DISCONTINUED] loratadine (CLARITIN) 10 MG tablet Take 10 mg by mouth daily.   [DISCONTINUED] melatonin 3 MG TABS tablet Take 3 mg by mouth at bedtime. may have one '3mg'$  tab per request in addition to routine '5mg'$ .   [DISCONTINUED] melatonin 5 MG TABS Take 5 mg by mouth at bedtime.   [DISCONTINUED] torsemide (DEMADEX) 20 MG tablet Take 40 mg by mouth daily. And 1 tablet by mouth every 72 hours as needed for increased edema   No facility-administered encounter medications on file as of August 19, 2022.    Review of Systems  Immunization History  Administered Date(s) Administered   Fluad Quad(high Dose 65+) 06/07/2019   Influenza, High Dose Seasonal PF 07/19/2018,  06/04/2022   Influenza-Unspecified 06/18/2012, 06/03/2013, 05/19/2014, 06/21/2014, 04/13/2015, 06/11/2016, 05/23/2017   PFIZER(Purple Top)SARS-COV-2 Vaccination 06/28/2022   Pneumococcal Conjugate-13 09/11/2015   Pneumococcal-Unspecified 08/19/1998   Tdap 11/29/2004, 10/05/2014   Unspecified SARS-COV-2 Vaccination 06/28/2022   Zoster, Live 10/28/2007   Pertinent  Health Maintenance Due  Topic Date Due   INFLUENZA VACCINE  Completed      08/11/2021    2:00 AM 08/11/2021    8:45 AM 08/11/2021    8:10 PM 02/26/2022   10:55 AM 07/08/2022    2:45 PM  Fall Risk  Patient Fall Risk Level High fall risk High fall risk High fall risk High fall risk High fall risk   Functional Status Survey:    Vitals:   2022/08/14 1439  BP: 104/62  Pulse: 92  Resp: 12  Temp: 97.7 F (36.5 C)  SpO2: (!) 83%  Weight: 197 lb 4.8 oz (89.5 kg)  Height: 5' 8.5" (1.74 m)   Body mass index is 29.56 kg/m. Physical Exam Constitutional:      Comments: Eyes closed, arouses to name. Grimacing.   Cardiovascular:     Pulses: Normal pulses.  Pulmonary:      Comments: Grunting with each expiration Neurological:     Mental Status: He is disoriented.     Labs reviewed: Recent Labs    08/10/21 1252 08/11/21 0024 08/11/21 0431  NA 136  --  134*  K 4.2  --  3.7  CL 99  --  99  CO2 28  --  27  GLUCOSE 109*  --  143*  BUN 38*  --  39*  CREATININE 2.46* 2.38* 2.20*  CALCIUM 8.6*  --  8.3*  MG  --   --  1.5*  PHOS  --   --  4.5   Recent Labs    08/10/21 1252 08/11/21 0431  AST 19 17  ALT 19 16  ALKPHOS 81 64  BILITOT 0.7 1.6*  PROT 6.5 5.5*  ALBUMIN 3.4* 2.6*   Recent Labs    02/26/22 1149 07/01/22 1423 07/16/22 1052  WBC 12.0* 16.1* 9.1  NEUTROABS 8.6* 12.5* 6.9  HGB 10.2* 8.8* 7.7*  HCT 30.6* 26.5* 23.7*  MCV 88.7 95.3 96.7  PLT 433* 453* 431*   Lab Results  Component Value Date   TSH 1.355 03/06/2020   No results found for: "HGBA1C" Lab Results  Component Value Date   CHOL 120 11/07/2020   HDL 61 11/07/2020   LDLCALC 52 11/07/2020   TRIG 37 11/07/2020   CHOLHDL 2.0 11/07/2020    Significant Diagnostic Results in last 30 days:  No results found.  Assessment/Plan Ileus (HCC)  Chronic obstructive pulmonary disease, unspecified COPD type [facility])  Hospice care patient  Delirium due to another medical condition Patient continues to appear uncomfortable. No longer taking scheduled medication. Patient appears uncomfortable despite goal of comfort measures. Will continue morphine as previously ordered and order ativan 0.5 mg q8 hours prn for agitation and anxiety.    Family/ staff Communication: nursing  Labs/tests ordered:  none  Tomasa Rand, MD, [place] [phone]

## 2022-08-19 DEATH — deceased

## 2022-09-03 ENCOUNTER — Ambulatory Visit: Payer: Medicare PPO | Admitting: Oncology

## 2022-09-03 ENCOUNTER — Ambulatory Visit: Payer: Medicare PPO

## 2022-09-03 ENCOUNTER — Other Ambulatory Visit: Payer: Medicare PPO

## 2023-04-21 IMAGING — CR DG CHEST 2V
2 series · 2 of 2 positions shown · non-contrast
Comparison: Chest x-ray dated July 09, 2021.

CLINICAL DATA: Chest pain, cough, and shortness of breath.

EXAM:
CHEST - 2 VIEW

[chest lat]
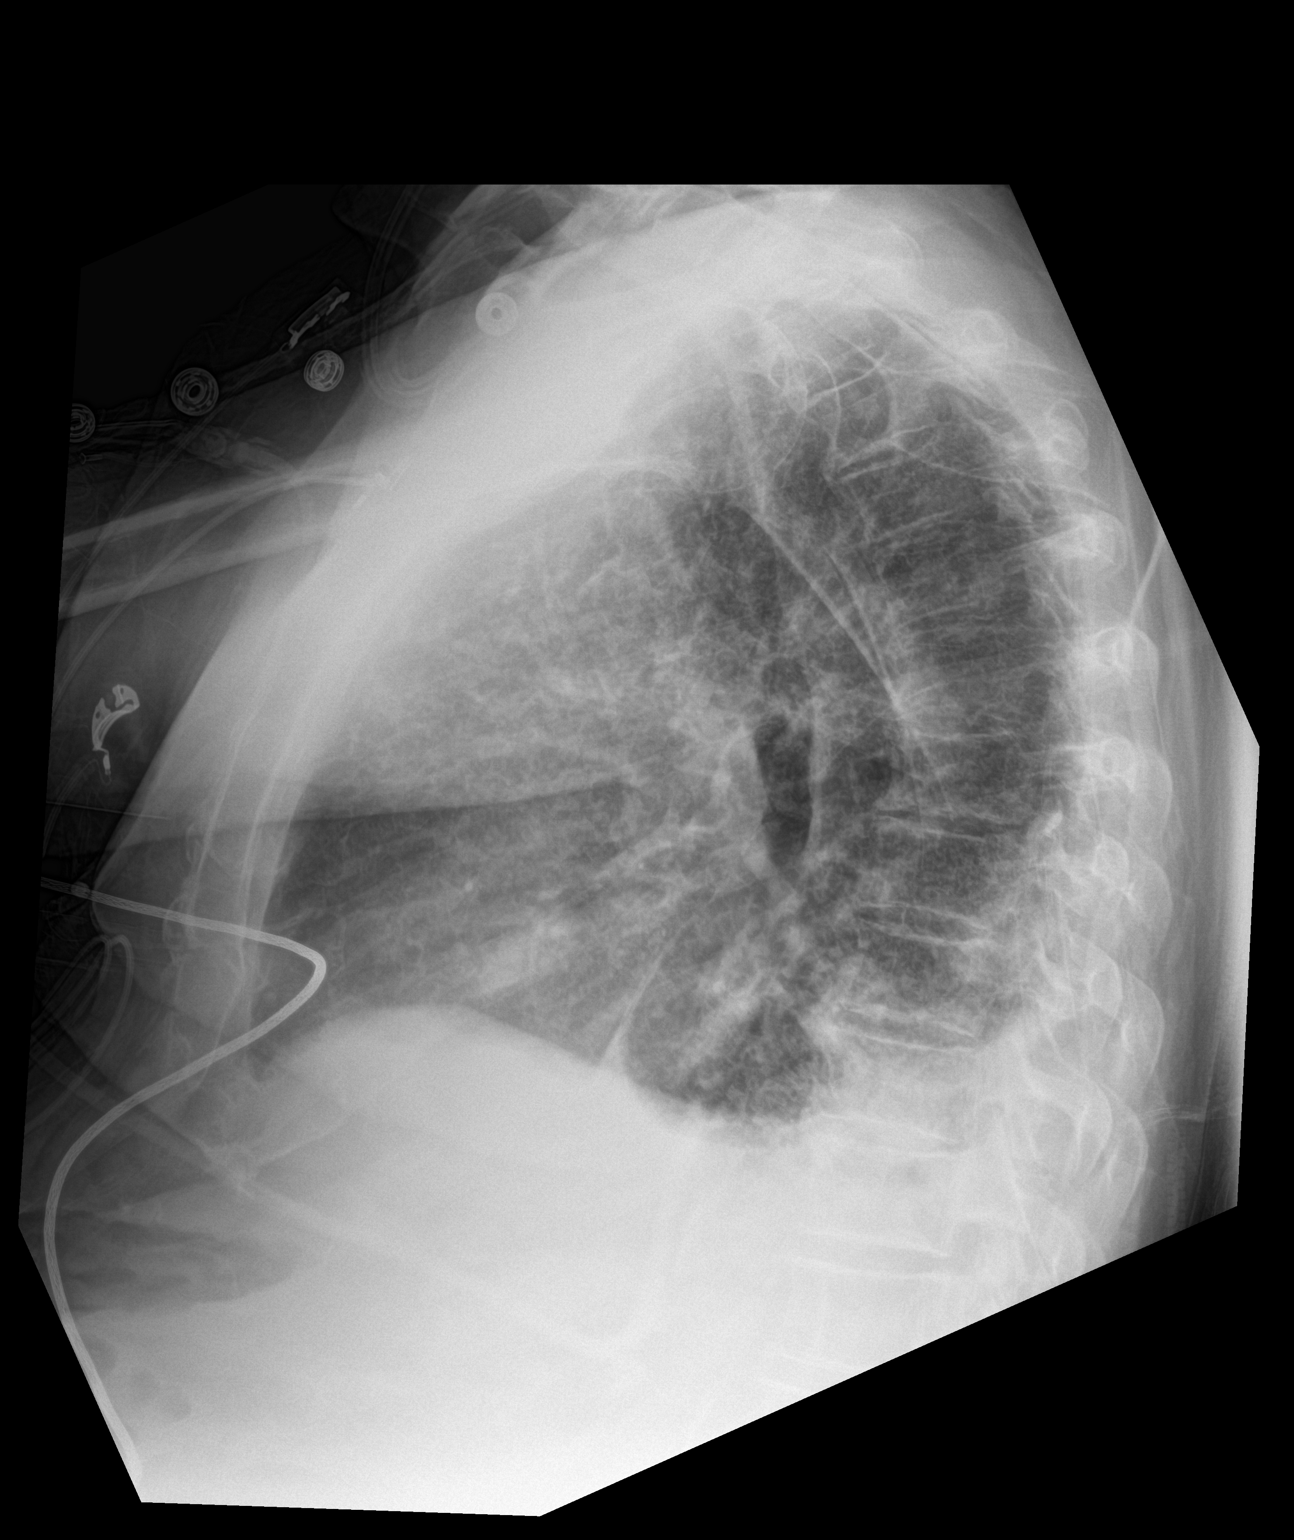

[chest ap]
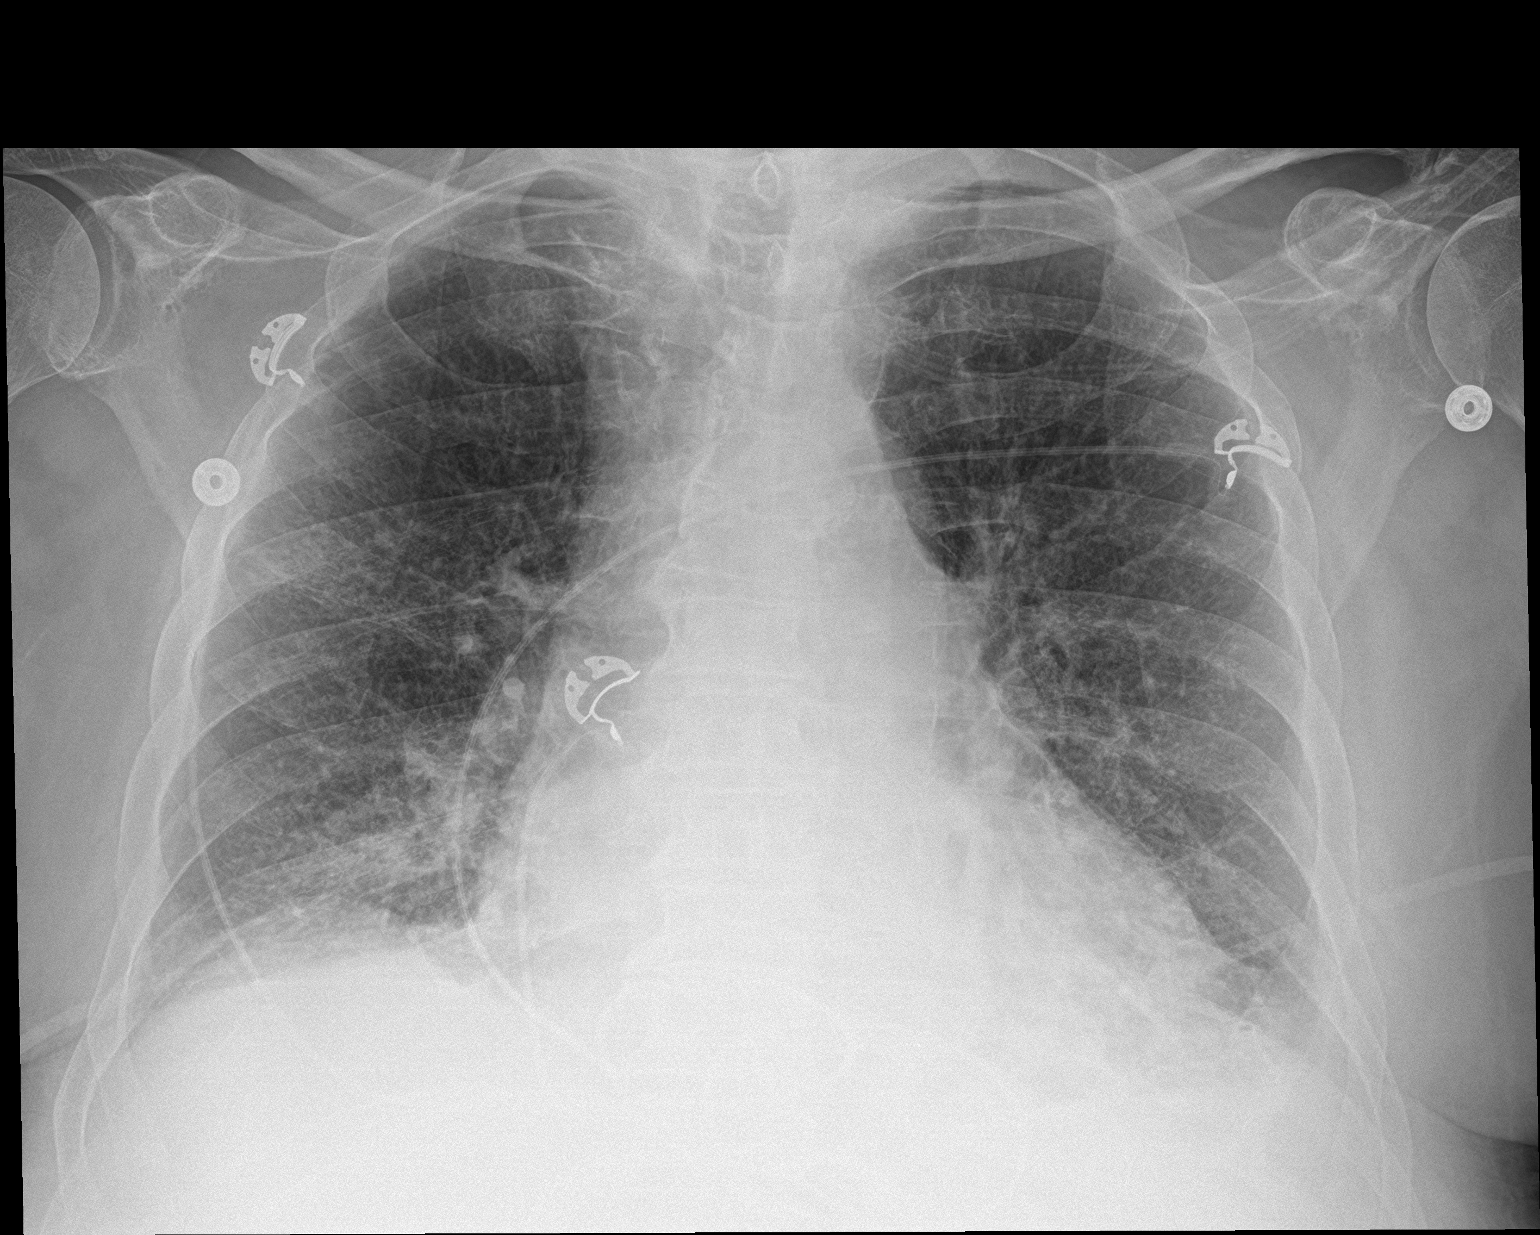

[2 of 2 positions shown; findings below may reference images not displayed]

FINDINGS: Stable cardiomediastinal silhouette. Chronically coarsened
interstitial markings. Unchanged mild bibasilar atelectasis and
small bilateral pleural effusions. No pneumothorax. No acute osseous
abnormality.
IMPRESSION: 1. Unchanged mild bibasilar atelectasis and small bilateral pleural
effusions.

## 2023-04-23 IMAGING — DX DG CHEST 1V PORT
1 series · 1 of 1 positions shown · non-contrast
Comparison: Previous studies including the examination of
07/12/2021

CLINICAL DATA: Difficulty breathing

EXAM:
PORTABLE CHEST 1 VIEW

[chest ap]
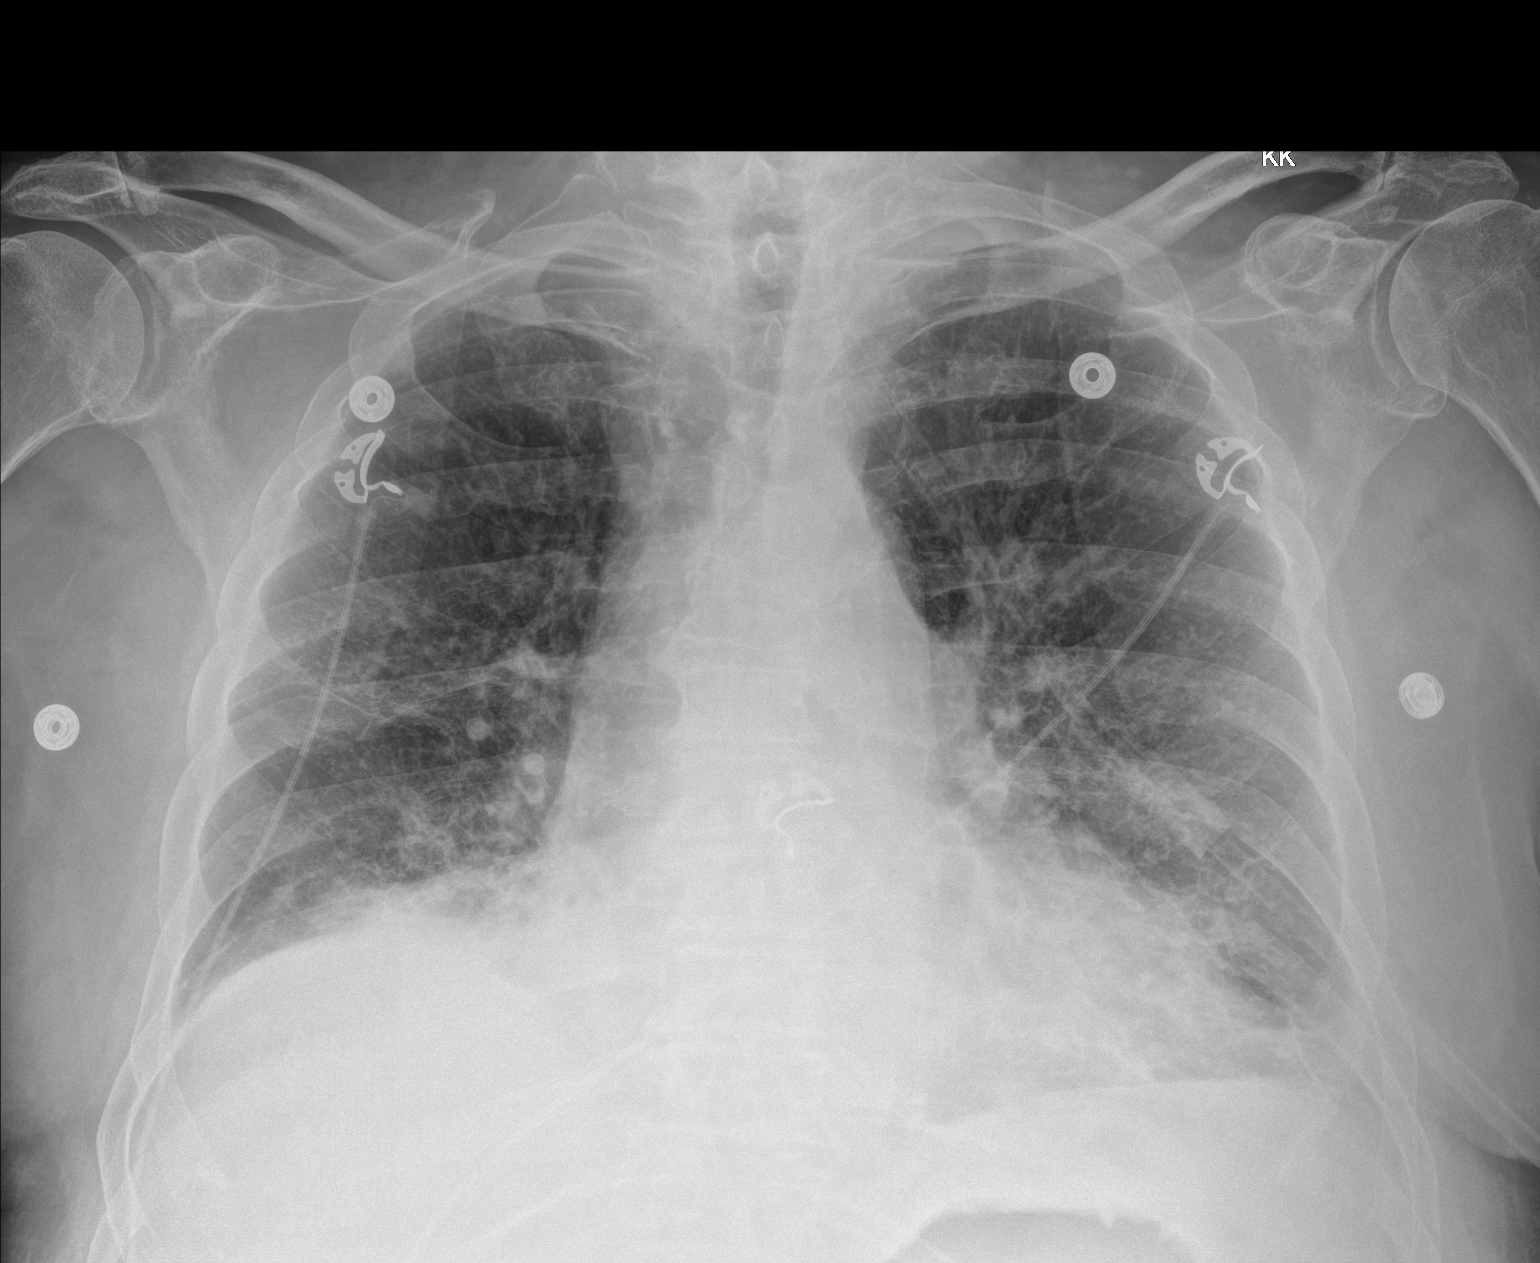

[1 of 1 positions shown; findings below may reference images not displayed]

FINDINGS: Transverse diameter of heart is increased. Central pulmonary vessels
are more prominent. Increased interstitial markings are seen in
parahilar regions with interval worsening. There are linear
densities and increased interstitial markings in both lower lung
fields with interval worsening. There is blunting of left lateral CP
angle. There is no pneumothorax.
IMPRESSION: Increased interstitial markings are seen in the parahilar regions
and lower lung fields with interval worsening suggesting pulmonary
edema or bilateral pneumonia. Possible small left pleural effusion.

## 2023-05-20 IMAGING — DX DG CHEST 1V PORT
1 series · 1 of 1 positions shown · non-contrast
Comparison: 07/14/2021

CLINICAL DATA: Dyspnea.

EXAM:
PORTABLE CHEST 1 VIEW

[chest ap]
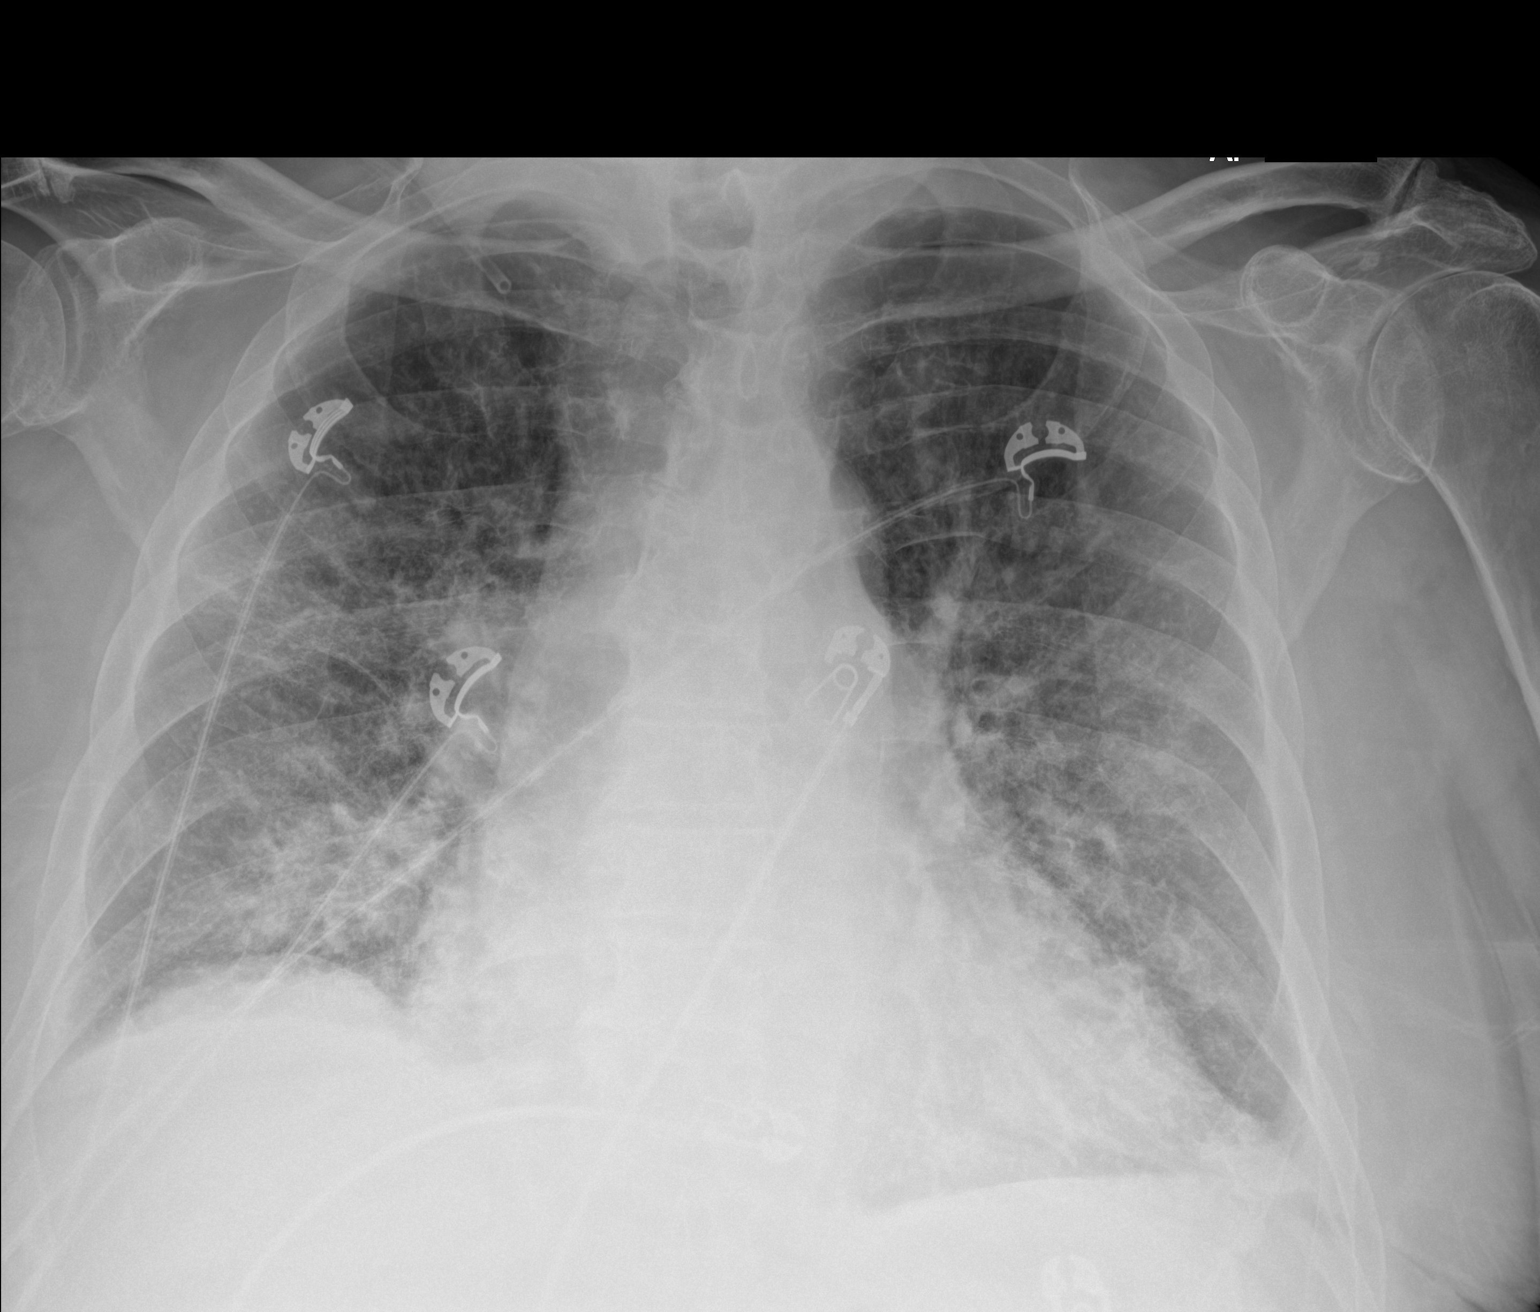

[1 of 1 positions shown; findings below may reference images not displayed]

FINDINGS: The cardio pericardial silhouette is enlarged. Interstitial markings
are diffusely coarsened with chronic features. Patchy airspace
disease again noted left base with increased patchy airspace opacity
in the right lower lung today. Possible tiny left effusion. The
visualized bony structures of the thorax show no acute abnormality.
Telemetry leads overlie the chest.
IMPRESSION: Patchy airspace disease at the bases bilaterally and progressive in
the right base since prior study. Features may reflect bibasilar
atelectasis or pneumonia.

Possible tiny left effusion.

## 2023-05-20 IMAGING — CT CT ABD-PELV W/O CM
2 of 4 series · 16 of 46 positions shown, 18 images · non-contrast
Comparison: 06/26/2021

CLINICAL DATA: Recent perforated diverticulitis. Upper
gastrointestinal bleeding. Right-sided abdominal pain.

EXAM:
CT ABDOMEN AND PELVIS WITHOUT CONTRAST
TECHNIQUE: Multidetector CT imaging of the abdomen and pelvis was performed
following the standard protocol without IV contrast.

[Series 2: routine abd/pel wo · axial · 0.92mm/px · z∈[-805,-315]mm · 13 of 108 slices shown, 15 images]
[im 5/108  soft-tissue]
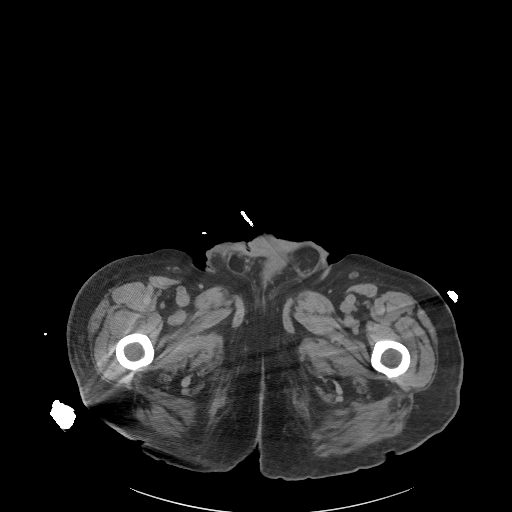
[im 5/108  bone]
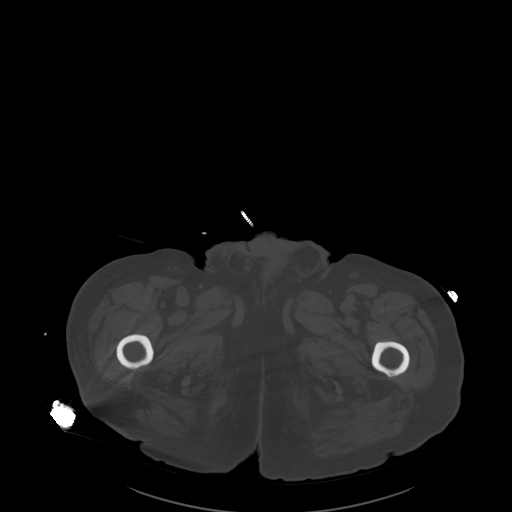
[im 14/108  soft-tissue]
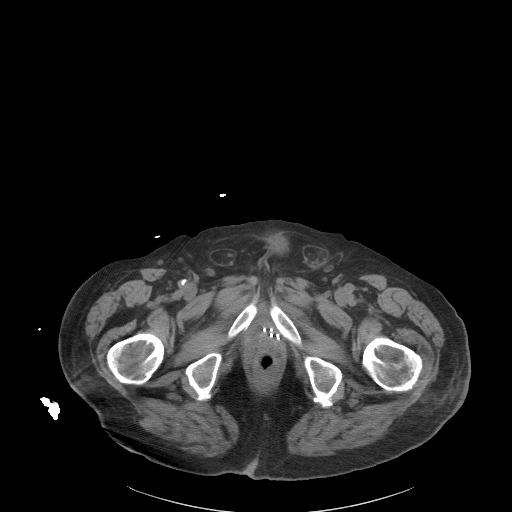
[im 24/108  soft-tissue]
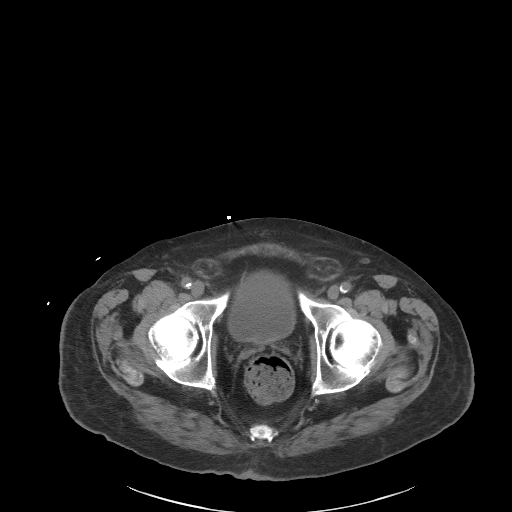
[im 28/108  soft-tissue]
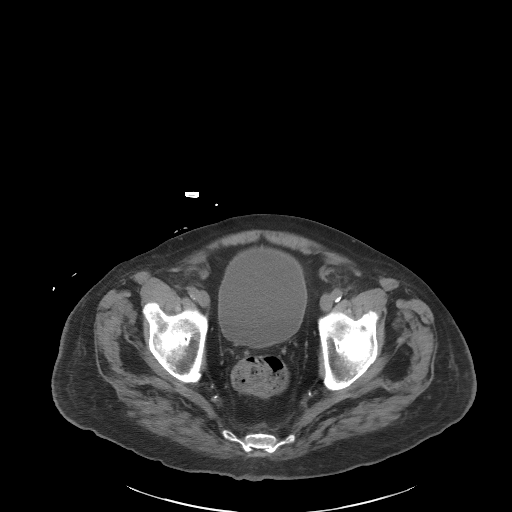
[im 38/108  soft-tissue]
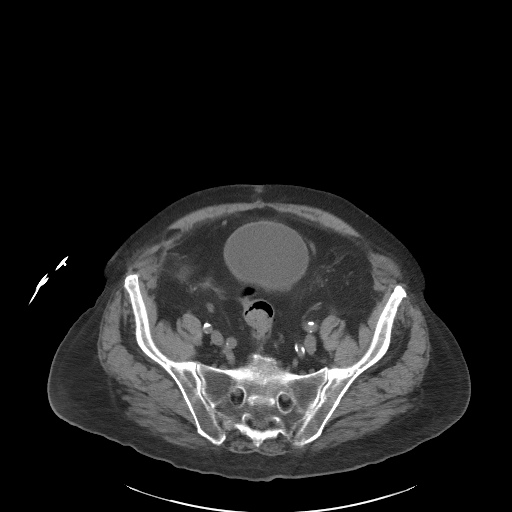
[im 47/108  soft-tissue]
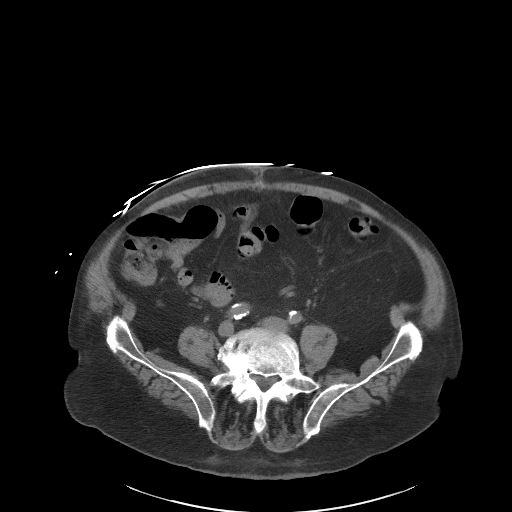
[im 56/108  soft-tissue]
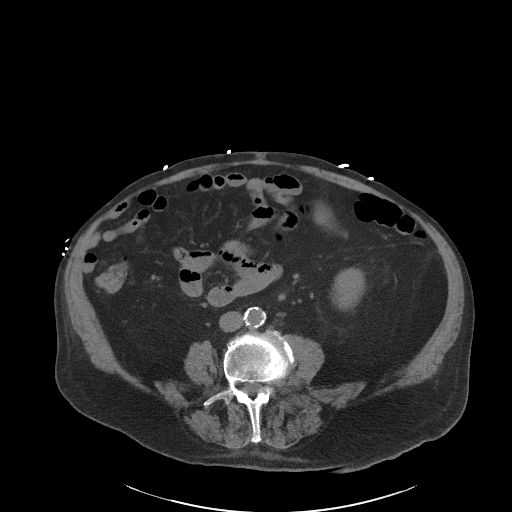
[im 61/108  soft-tissue]
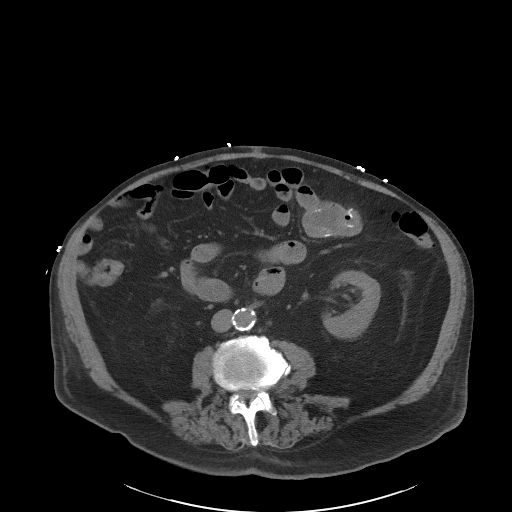
[im 70/108  soft-tissue]
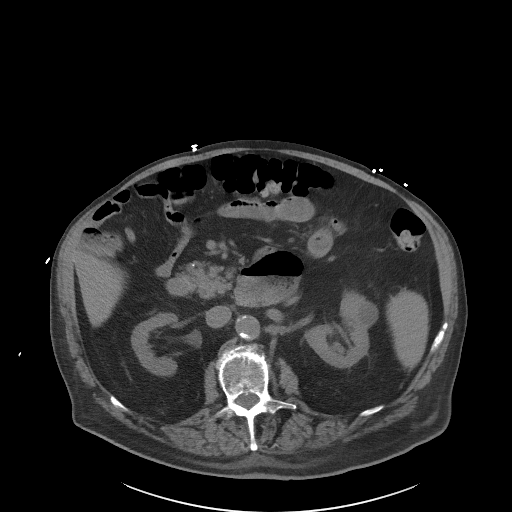
[im 70/108  bone]
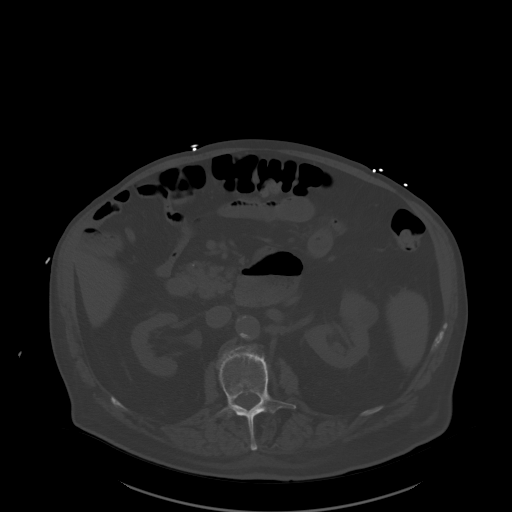
[im 80/108  soft-tissue]
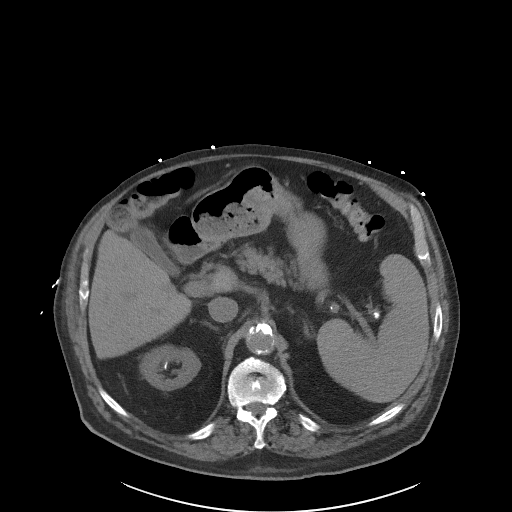
[im 84/108  soft-tissue]
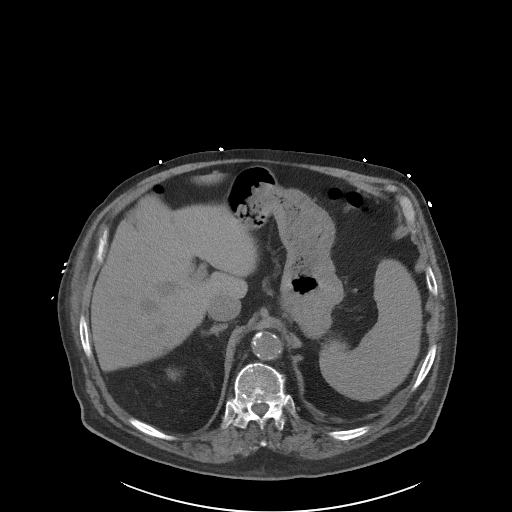
[im 94/108  soft-tissue]
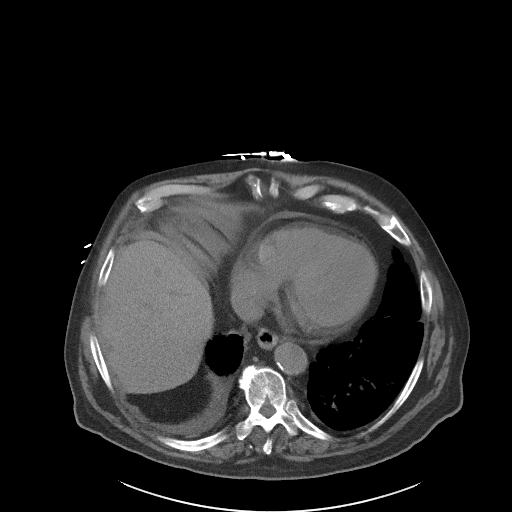
[im 103/108  soft-tissue]
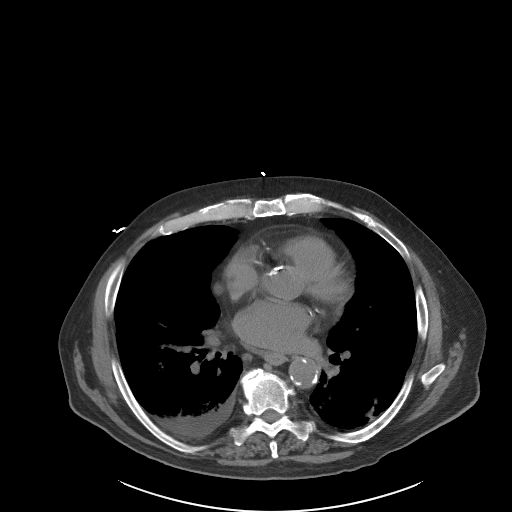

[Series 5: coronal st · coronal · 0.85mm/px · 3 of 105 slices shown]
[im 35/105  soft-tissue]
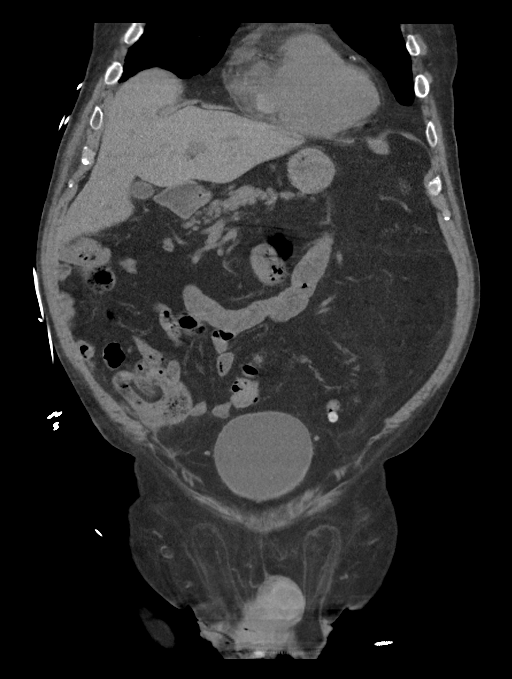
[im 47/105  soft-tissue]
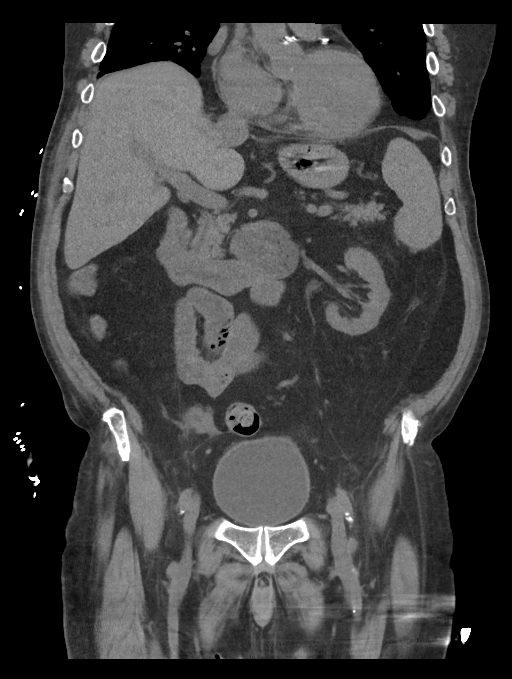
[im 58/105  soft-tissue]
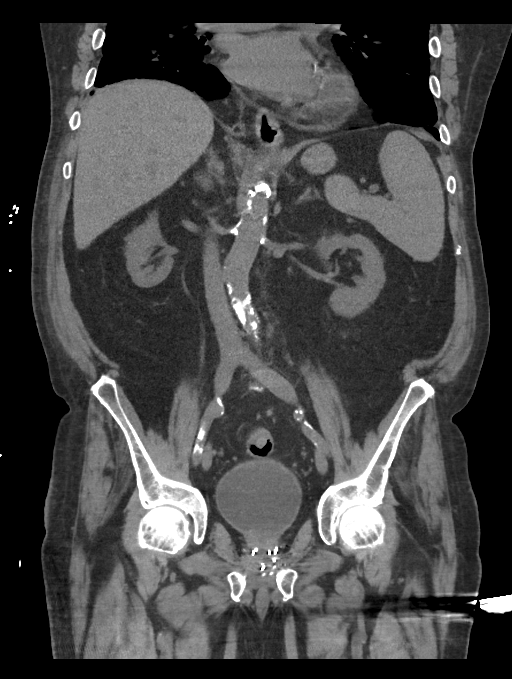

[16 of 46 positions shown; findings below may reference images not displayed]

FINDINGS: Lower chest: Right effusion is slightly larger with dependent
atelectasis in the right lung. Newly seen widespread patchy
pulmonary infiltrates consistent with bronchopneumonia, possibly
viral. Consider coronavirus. Small pericardial effusion is present.

Hepatobiliary: Liver parenchyma is normal.  No calcified gallstones.

Pancreas: Normal

Spleen: Normal

Adrenals/Urinary Tract: Adrenal glands are normal. No acute renal
finding. Vascular calcification in both kidneys. Left renal cyst. No
evidence of obstruction or inflammatory change. Bladder is normal.

Stomach/Bowel: Stomach appears normal by CT. Previous small bowel
anastomosis without complicating feature by CT. There is what
appears to be a giant diverticulum arising from the fourth portion
of the duodenum, without acute feature. No acute or finding related
to the colon.

Vascular/Lymphatic: Aortic atherosclerosis. No aneurysm. IVC is
normal.

Reproductive: Prostate seed implants in place.

Other: No free fluid or air.

Musculoskeletal: Scoliosis and degenerative change of the spine.
IMPRESSION: Right pleural effusion larger than on the previous exam. Dependent
atelectasis in the right lower lung.

Patchy bilateral pulmonary infiltrates consistent with
bronchopneumonia, possibly viral.

No distinct explanation for gastrointestinal bleeding. No evidence
of complication at the small bowel anastomosis. Giant diverticulum
of the distal duodenum without acute feature by CT.

Aortic Atherosclerosis ([id number]).
